# Patient Record
Sex: Female | Born: 1935
Health system: Southern US, Community
[De-identification: ages and names within clinical notes are randomized; demographics above are authoritative.]

## PROBLEM LIST (undated history)

## (undated) DIAGNOSIS — Z923 Personal history of irradiation: Secondary | ICD-10-CM

## (undated) DIAGNOSIS — Z9221 Personal history of antineoplastic chemotherapy: Secondary | ICD-10-CM

## (undated) DIAGNOSIS — M2550 Pain in unspecified joint: Secondary | ICD-10-CM

## (undated) DIAGNOSIS — H9190 Unspecified hearing loss, unspecified ear: Secondary | ICD-10-CM

## (undated) DIAGNOSIS — Z8619 Personal history of other infectious and parasitic diseases: Secondary | ICD-10-CM

## (undated) DIAGNOSIS — M545 Low back pain, unspecified: Secondary | ICD-10-CM

## (undated) DIAGNOSIS — IMO0001 Reserved for inherently not codable concepts without codable children: Secondary | ICD-10-CM

## (undated) DIAGNOSIS — R159 Full incontinence of feces: Secondary | ICD-10-CM

## (undated) DIAGNOSIS — M797 Fibromyalgia: Secondary | ICD-10-CM

## (undated) DIAGNOSIS — R3915 Urgency of urination: Secondary | ICD-10-CM

## (undated) DIAGNOSIS — D649 Anemia, unspecified: Secondary | ICD-10-CM

## (undated) DIAGNOSIS — Z860100 Personal history of colon polyps, unspecified: Secondary | ICD-10-CM

## (undated) DIAGNOSIS — F329 Major depressive disorder, single episode, unspecified: Secondary | ICD-10-CM

## (undated) DIAGNOSIS — E119 Type 2 diabetes mellitus without complications: Secondary | ICD-10-CM

## (undated) DIAGNOSIS — C801 Malignant (primary) neoplasm, unspecified: Secondary | ICD-10-CM

## (undated) DIAGNOSIS — H269 Unspecified cataract: Secondary | ICD-10-CM

## (undated) DIAGNOSIS — R531 Weakness: Secondary | ICD-10-CM

## (undated) DIAGNOSIS — Z8739 Personal history of other diseases of the musculoskeletal system and connective tissue: Secondary | ICD-10-CM

## (undated) DIAGNOSIS — Z9889 Other specified postprocedural states: Secondary | ICD-10-CM

## (undated) DIAGNOSIS — G629 Polyneuropathy, unspecified: Secondary | ICD-10-CM

## (undated) DIAGNOSIS — K219 Gastro-esophageal reflux disease without esophagitis: Secondary | ICD-10-CM

## (undated) DIAGNOSIS — R350 Frequency of micturition: Secondary | ICD-10-CM

## (undated) DIAGNOSIS — T4145XA Adverse effect of unspecified anesthetic, initial encounter: Secondary | ICD-10-CM

## (undated) DIAGNOSIS — R011 Cardiac murmur, unspecified: Secondary | ICD-10-CM

## (undated) DIAGNOSIS — Z5189 Encounter for other specified aftercare: Secondary | ICD-10-CM

## (undated) DIAGNOSIS — F32A Depression, unspecified: Secondary | ICD-10-CM

## (undated) DIAGNOSIS — M1990 Unspecified osteoarthritis, unspecified site: Secondary | ICD-10-CM

## (undated) DIAGNOSIS — E785 Hyperlipidemia, unspecified: Secondary | ICD-10-CM

## (undated) DIAGNOSIS — R0600 Dyspnea, unspecified: Secondary | ICD-10-CM

## (undated) DIAGNOSIS — R112 Nausea with vomiting, unspecified: Secondary | ICD-10-CM

## (undated) DIAGNOSIS — I10 Essential (primary) hypertension: Secondary | ICD-10-CM

## (undated) DIAGNOSIS — IMO0002 Reserved for concepts with insufficient information to code with codable children: Secondary | ICD-10-CM

## (undated) DIAGNOSIS — T8859XA Other complications of anesthesia, initial encounter: Secondary | ICD-10-CM

## (undated) DIAGNOSIS — Z8719 Personal history of other diseases of the digestive system: Secondary | ICD-10-CM

## (undated) DIAGNOSIS — R351 Nocturia: Secondary | ICD-10-CM

## (undated) DIAGNOSIS — N3010 Interstitial cystitis (chronic) without hematuria: Secondary | ICD-10-CM

## (undated) DIAGNOSIS — B399 Histoplasmosis, unspecified: Secondary | ICD-10-CM

## (undated) DIAGNOSIS — F419 Anxiety disorder, unspecified: Secondary | ICD-10-CM

## (undated) DIAGNOSIS — H409 Unspecified glaucoma: Secondary | ICD-10-CM

## (undated) HISTORY — PX: CERVICAL FUSION: SHX112

## (undated) HISTORY — DX: Essential (primary) hypertension: I10

## (undated) HISTORY — DX: Low back pain: M54.5

## (undated) HISTORY — PX: COLONOSCOPY: SHX174

## (undated) HISTORY — PX: BREAST LUMPECTOMY: SHX2

## (undated) HISTORY — DX: Gastro-esophageal reflux disease without esophagitis: K21.9

## (undated) HISTORY — DX: Encounter for other specified aftercare: Z51.89

## (undated) HISTORY — PX: DILATION AND CURETTAGE OF UTERUS: SHX78

## (undated) HISTORY — DX: Hyperlipidemia, unspecified: E78.5

## (undated) HISTORY — PX: OTHER SURGICAL HISTORY: SHX169

## (undated) HISTORY — PX: ABDOMINAL HYSTERECTOMY: SHX81

## (undated) HISTORY — DX: Cardiac murmur, unspecified: R01.1

## (undated) HISTORY — PX: CHOLECYSTECTOMY: SHX55

## (undated) HISTORY — PX: VAGINAL HYSTERECTOMY: SHX2639

## (undated) HISTORY — DX: Unspecified osteoarthritis, unspecified site: M19.90

## (undated) HISTORY — DX: Low back pain, unspecified: M54.50

## (undated) HISTORY — PX: SALPINGOOPHORECTOMY: SHX82

## (undated) HISTORY — DX: Interstitial cystitis (chronic) without hematuria: N30.10

## (undated) HISTORY — DX: Reserved for inherently not codable concepts without codable children: IMO0001

## (undated) HISTORY — DX: Reserved for concepts with insufficient information to code with codable children: IMO0002

## (undated) HISTORY — PX: ANKLE SURGERY: SHX546

## (undated) HISTORY — PX: UPPER GASTROINTESTINAL ENDOSCOPY: SHX188

## (undated) HISTORY — PX: ESOPHAGOGASTRODUODENOSCOPY: SHX1529

## (undated) HISTORY — PX: CYSTOCELE REPAIR: SHX163

## (undated) NOTE — *Deleted (*Deleted)
[facility]   [phone] [phone]   Clinic Follow up Note   Patient Care Team: Irena Reichmann, DO as PCP - General (Family Medicine) Rossie Muskrat, MD as Referring Physician (Rheumatology) Emelia Loron, MD as Consulting Physician (General Surgery) Malachy Mood, MD as Consulting Physician (Hematology) Axel Filler Larna Daughters, NP as Nurse Practitioner (Hematology and Oncology)  Date of Service:  04/08/2020  CHIEF COMPLAINT: F/u of left breast cancer  SUMMARY OF ONCOLOGIC HISTORY: Oncology History Overview Note  Malignant neoplasm of upper outer quadrant of female breast [facility])   Staging form: Breast, AJCC 7th Edition     Pathologic stage from 11/14/2015: Stage IA (T1c, N0, cM0) - Signed by Malachy Mood, MD on 11/25/2015     Clinical stage from 11/20/2015: Stage IA (T1c, N0, M0) - Signed by Si Gaul, MD on 11/20/2015     Malignant neoplasm of upper outer quadrant of female breast (HCC)  10/09/2015 Mammogram   Screening bilateral mammograms showed a possible left breast mass. Diagnostic mammogram and ultrasound showed a 7 x 7 x 7 mm mass in the left breast at 11:00 position.   10/22/2015 Receptors her2   ER negative, PR negative, HER-2 not amplified   10/22/2015 Initial Biopsy   Left breast needle core biopsy at the 1:00 position showed invasive ductal carcinoma.   10/22/2015 Initial Diagnosis   Malignant neoplasm of upper outer quadrant of female breast (HCC)   11/14/2015 Surgery   Left breast lumpectomy and sentinel lymph node biopsy Dwain Sarna)   11/14/2015 Pathology Results   Left breast lumpectomy showed invasive ductal carcinoma, grade 3, 1.1 cm, resection margins were negative, sentinel lymph node biopsy showed benign breast parenchyma, lymph node tissue not identified. Repeated ER PR and HER-2 were all negative.   12/20/2015 - 03/13/2016 Chemotherapy   Weekly Taxol 80 mg/m x 2 cycles, then changed to Abraxane 100 mg/m for cycles  3-12 due to infusion reaction. Abraxane dose-reduced cycle #9, then again for cycles #11 & #12 due to neuropathy. [total 12 cycles chemo]    04/01/2016 - 05/11/2016 Radiation Therapy   Adjuvant breast radiation ([facility]). Left breast: 50.4 Gy in 28 fractions   06/19/2016 Imaging   MR Lumbar Spine without contrast  IMPRESSION: 1. Diffuse lumbar spondylosis with discogenic and facet degenerative changes progressed from 2010 lumbar MRI. 2. Multilevel canal stenosis is mild-to-moderate at L2-3, moderate L3-4, and mild at L4-5. 3. Multiple levels of mild and moderate neural foraminal narrowing with severe foraminal narrowing at the right L3-4 and L4-5 levels.   11/12/2017 Imaging   11/12/2017 DEXA ASSESSMENT: The BMD measured at Forearm Radius 33% is 0.648 g/cm2 with a T-score of -2.7. This patient is considered osteoporotic according to World Health Organization [facility]) criteria.   11/12/2017 Mammogram   11/12/2017 Diagnostic Mammogram IMPRESSION: No mammographic evidence of malignancy in either breast, status post left lumpectomy.      CURRENT THERAPY:  Surveillance  INTERVAL HISTORY: *** Julie [name] is here for a follow up of left breast cancer. She presents to the clinic alone.    REVIEW OF SYSTEMS:  *** Constitutional: Denies fevers, chills or abnormal weight loss Eyes: Denies blurriness of vision Ears, nose, mouth, throat, and face: Denies mucositis or sore throat Respiratory: Denies cough, dyspnea or wheezes Cardiovascular: Denies palpitation, chest discomfort or lower extremity swelling Gastrointestinal:  Denies nausea, heartburn or change in bowel habits Skin: Denies abnormal skin rashes Lymphatics: Denies new lymphadenopathy or easy bruising Neurological:Denies numbness, tingling or new weaknesses  Behavioral/Psych: Mood is stable, no new changes  All other systems were reviewed with the patient and are negative.  MEDICAL HISTORY:  Past Medical History:   Diagnosis Date  . Anemia   . Anxiety    takes Klonopin at bedtime  . Arthritis    takes Methotrexate weekly and Prednisone  . Blood transfusion    no abnormal reaction noted  . Cancer (HCC)    breast   . Cataracts, bilateral    immature  . Complication of anesthesia    wakes up during surgery  . Depression   . Diabetes mellitus without complication (HCC)   . Dyspnea    with exertion since chemo   . Fibromyalgia    takes Lyrica daily  . GERD (gastroesophageal reflux disease)    takes Nexium daily  . Glaucoma   . Hard of hearing   . Heart murmur   . Histoplasmosis   . History of colon polyps   . History of gout   . History of hiatal hernia   . History of shingles   . Hyperlipidemia   . Hypertension    takes Amlodipine,Metoprolol,and Losartan daily  . Incontinence of bowel   . Interstitial cystitis   . Joint pain   . Low back pain   . Nocturia   . Peripheral neuropathy   . Peripheral neuropathy   . Personal history of chemotherapy   . Personal history of radiation therapy   . PONV (postoperative nausea and vomiting)   . Ulcer 1970   duodenal  . Urinary frequency   . Urinary urgency   . Weakness    numbness in both hands and feet    SURGICAL HISTORY: Past Surgical History:  Procedure Laterality Date  . ABDOMINAL HYSTERECTOMY    . accupuncture  3 yrs ago  . ANKLE FUSION Left 05/10/2014   Procedure: LEFT ANKLE ARTHRODESIS ;  Surgeon: Toni Arthurs, MD;  Location: [facility] OR;  Service: Orthopedics;  Laterality: Left;  . ANKLE SURGERY Left   . arm surgery Left    radius  . bladder tacked     . BREAST LUMPECTOMY Left 10/2015  . CERVICAL FUSION    . CHOLECYSTECTOMY    . COLONOSCOPY    . CYSTOCELE REPAIR    . DILATION AND CURETTAGE OF UTERUS    . ESOPHAGOGASTRODUODENOSCOPY    . PORT A CATH REVISION N/A 12/27/2015   Procedure: PORT A CATH REVISION;  Surgeon: Emelia Loron, MD;  Location: [facility];  Service: General;  Laterality: N/A;  .  PORT-A-CATH REMOVAL N/A 03/25/2016   Procedure: REMOVAL PORT-A-CATH;  Surgeon: Emelia Loron, MD;  Location: [facility] ORS;  Service: General;  Laterality: N/A;  . PORTACATH PLACEMENT Right 12/27/2015   Procedure: INSERTION PORT-A-CATH WITH ULTRASOUND ;  Surgeon: Emelia Loron, MD;  Location: [facility];  Service: General;  Laterality: Right;  . RADIOACTIVE SEED GUIDED PARTIAL MASTECTOMY WITH AXILLARY SENTINEL LYMPH NODE BIOPSY Left 11/14/2015   Procedure: RADIOACTIVE SEED GUIDED LEFT BREAST LUMPECTOMY WITH AXILLARY SENTINEL LYMPH NODE BIOPSY;  Surgeon: Emelia Loron, MD;  Location: [facility];  Service: General;  Laterality: Left;  RADIOACTIVE SEED GUIDED LEFT BREAST LUMPECTOMY WITH AXILLARY SENTINEL LYMPH NODE BIOPSY  . SALPINGOOPHORECTOMY Bilateral   . TOTAL KNEE ARTHROPLASTY Right 02/28/2015   Procedure: RIGHT TOTAL KNEE ARTHROPLASTY;  Surgeon: Samson Frederic, MD;  Location: [facility];  Service: Orthopedics;  Laterality: Right;  . UPPER GASTROINTESTINAL ENDOSCOPY    . VAGINAL HYSTERECTOMY  I have reviewed the social history and family history with the patient and they are unchanged from previous note.  ALLERGIES:  is allergic to codeine, other, and aspirin.  MEDICATIONS:  Current Outpatient Medications  Medication Sig Dispense Refill  . amitriptyline (ELAVIL) 50 MG tablet Take 50 mg by mouth at bedtime.    [name] amLODipine (NORVASC) 10 MG tablet Take 10 mg by mouth daily.    . cholecalciferol (VITAMIN D3) 25 MCG (1000 UNIT) tablet Take 1,000 Units by mouth daily.    . diclofenac Sodium (VOLTAREN) 1 % GEL SMARTSIG:3 Inch(es) Topical 3 Times Daily PRN    . docusate sodium (COLACE) 100 MG capsule Take 1 capsule (100 mg total) by mouth every 12 (twelve) hours. (Patient not taking: Reported on 01/16/2020) 60 capsule 0  . folic acid (FOLVITE) 1 MG tablet Take 2 mg by mouth every morning.     [name] FREESTYLE LITE test strip TEST ONCE EVERY DAY (Patient not taking:  Reported on 01/16/2020)  4  . hydroxychloroquine (PLAQUENIL) 200 MG tablet Take 200 mg by mouth daily.    [name] LIDOCAINE PAIN RELIEF 4 % PTCH Apply 1 patch topically 2 (two) times daily.    [name] lovastatin (MEVACOR) 40 MG tablet 1 (ONE) TABLET TAKE IN THE EVENING AFTER DINNER (Patient taking differently: Take 40 mg by mouth every evening. ) 90 tablet 1  . metoprolol succinate (TOPROL-XL) 50 MG 24 hr tablet Take 50 mg by mouth every evening.   2  . omeprazole (PRILOSEC) 40 MG capsule Take 40 mg by mouth daily.    [name] oxyCODONE (OXY IR/ROXICODONE) 5 MG immediate release tablet Take 5 mg by mouth 3 (three) times daily as needed.    . polyethylene glycol (MIRALAX / GLYCOLAX) 17 g packet Take 17 g by mouth daily.    . predniSONE (DELTASONE) 5 MG tablet Take 7.5 mg by mouth daily.    . predniSONE (STERAPRED UNI-PAK 21 TAB) 5 MG (21) TBPK tablet Take by mouth. (Patient not taking: Reported on 01/16/2020)    . pregabalin (LYRICA) 75 MG capsule Take 75-150 mg by mouth See admin instructions. 75 Mg in the Am and 150 MG at bedtime    . valsartan-hydrochlorothiazide (DIOVAN-HCT) 320-12.5 MG tablet Take 0.5 tablets by mouth daily.     No current facility-administered medications for this visit.    PHYSICAL EXAMINATION: ECOG PERFORMANCE STATUS: {CHL ONC ECOG PS:[phone]}  There were no vitals filed for this visit. There were no vitals filed for this visit. *** GENERAL:alert, no distress and comfortable SKIN: skin color, texture, turgor are normal, no rashes or significant lesions EYES: normal, Conjunctiva are pink and non-injected, sclera clear {OROPHARYNX:no exudate, no erythema and lips, buccal mucosa, and tongue normal}  NECK: supple, thyroid normal size, non-tender, without nodularity LYMPH:  no palpable lymphadenopathy in the cervical, axillary {or inguinal} LUNGS: clear to auscultation and percussion with normal breathing effort HEART: regular rate & rhythm and no murmurs and no lower extremity edema  ABDOMEN:abdomen soft, non-tender and normal bowel sounds Musculoskeletal:no cyanosis of digits and no clubbing  NEURO: alert & oriented x 3 with fluent speech, no focal motor/sensory deficits  LABORATORY DATA:  I have reviewed the data as listed CBC Latest Ref Rng & Units 02/13/2020 02/01/2020 12/14/2019  WBC 4.0 - 10.5 K/uL 4.1 4.2 6.8  Hemoglobin 12.0 - 15.0 g/dL 11.3(L) 11.0(L) 10.8(L)  Hematocrit 36 - 46 % 36.9 35.4(L) 35.3(L)  Platelets 150 - 400 K/uL 220 198 258     CMP  Latest Ref Rng & Units 02/13/2020 02/01/2020 12/14/2019  Glucose 70 - 99 mg/dL 16(X) 096(E) 454(U)  BUN 8 - 23 mg/dL 21 98(J) 19(J)  Creatinine 0.44 - 1.00 mg/dL 4.78(G) 9.56(O) 1.30(Q)  Sodium 135 - 145 mmol/L 140 144 142  Potassium 3.5 - 5.1 mmol/L 5.2(H) 3.8 3.9  Chloride 98 - 111 mmol/L 107 108 105  CO2 22 - 32 mmol/L 27 29 24   Calcium 8.9 - 10.3 mg/dL 9.1 9.1 9.5  Total Protein 6.5 - 8.1 g/dL 7.1 6.8 7.4  Total Bilirubin 0.3 - 1.2 mg/dL 0.5 0.5 0.4  Alkaline Phos 38 - 126 U/L 80 84 90  AST 15 - 41 U/L 25 27 22   ALT 0 - 44 U/L 16 15 9       RADIOGRAPHIC STUDIES: I have personally reviewed the radiological images as listed and agreed with the findings in the report. No results found.   ASSESSMENT & PLAN:  Julie [name] is a 68 y.o. female with    1. Malignant or new present of upper outer quadrant of left female breast, invasive ductal carcinoma, grade 3, pT1cN0M0, stage IA, ER-/PR-/HER2- -She was diagnosed in 09/2015. She is s/p left breast lumpectomy, adjuvant Taxol/Abraxane and adjuvant radiation.She is currently on Surveillance.  ***    2. MGUS  -10/2019labs showher M-protein and light chain levels have been overall stable  -No clinical concern for disease progression. Will continue follow up SPE/IFE, and light chain levels every 3-6 months. -Her 05/2019 M protein did increase to 0.9. Due to her newly onset anemia and renal insufficiency, I have some concern about disease progression.  I  discussed if M-Protein continues to increase will recommend bone marrow biopsy.  -Her 09/07/19 Lumbar CT was negative  -Repeat lab with MM Panel in 2 months for close follow up    3.Joint and muscular pain, Rheumatoid arthritis,lumbar spondylosis -She will continue follow up with rheumatologist Dr. Kathi Ludwig. -She has chronic low back pain from lumber spondylosis -Her chronic back pain continues to progress over the years and was recently exacerbated by increased activity in the past 3 months. Her CT Lumbar spine from 09/07/19 showed increased spinal stenosis of her lumbar spine.   4. Anemia,intermittent  -On Folic Acid -Pt notes 2021 endoscopy by Dr Elnoria Howard. I will obtain report. Last colonoscopy was in 2012. *** -I will check her iron panel with next labs. I recommend she start prenatal vitamin.   5.Peripheral neuropathy, G1-2 -No significant impact on her function, we'll continue to monitor closely. She understands that recovery from neuropathy is a slow process. -She has also triedacupuncture which helped. Continuetaking Lyricaand Vitamin B12. -overall stable.Ambulates withwalker -Not mentioned today ***  6. HTN -Continuemedications and continue to Follow with Dr. Selena Batten on this.   7. Short Term Memory loss -Ptpreviouslysharedconcern for memory loss but she deniedfurther medication to help. She previously declined clinical trial option. Practicingmental games and read and socialize to helpwas encouraged.  8. Osteoporosis -10/2017 DEXA shows osteoporosis with lowest T-score of -2.7 at Forearm radius. -She is athigh fracture risk due to her frequent falls.  -I started her on Prolia injections q40months on 02/01/20. She is currently on Vit D as well.     Plan *** -Obtain endoscopy report from Dr Haywood Pao office  -I will call her with MM panel results  -Lab in 2 months for close f/u  -may recommend bone marrow biopsy based on her MM lab today and next in 2 months   -Lab and F/u in 4  months, or sooner if needed      No problem-specific Assessment & Plan notes found for this encounter.   No orders of the defined types were placed in this encounter.  All questions were answered. The patient knows to call the clinic with any problems, questions or concerns. No barriers to learning was detected. The total time spent in the appointment was {CHL ONC TIME VISIT - YNWGN:[id number]}.     Delphina Cahill 04/08/2020   Rogelia Rohrer, am acting as scribe for Malachy Mood, MD.   {Add scribe attestation statement}

---

## 1968-05-25 DIAGNOSIS — IMO0002 Reserved for concepts with insufficient information to code with codable children: Secondary | ICD-10-CM

## 1968-05-25 HISTORY — DX: Reserved for concepts with insufficient information to code with codable children: IMO0002

## 2004-12-26 ENCOUNTER — Ambulatory Visit (HOSPITAL_COMMUNITY): Admission: RE | Admit: 2004-12-26 | Discharge: 2004-12-26 | Payer: Self-pay | Admitting: Internal Medicine

## 2005-01-15 ENCOUNTER — Encounter (INDEPENDENT_AMBULATORY_CARE_PROVIDER_SITE_OTHER): Payer: Self-pay | Admitting: *Deleted

## 2005-01-15 ENCOUNTER — Ambulatory Visit (HOSPITAL_COMMUNITY): Admission: RE | Admit: 2005-01-15 | Discharge: 2005-01-15 | Payer: Self-pay | Admitting: *Deleted

## 2005-01-30 ENCOUNTER — Encounter: Admission: RE | Admit: 2005-01-30 | Discharge: 2005-01-30 | Payer: Self-pay | Admitting: Internal Medicine

## 2005-02-06 ENCOUNTER — Emergency Department (HOSPITAL_COMMUNITY): Admission: EM | Admit: 2005-02-06 | Discharge: 2005-02-06 | Payer: Self-pay | Admitting: Family Medicine

## 2005-02-19 ENCOUNTER — Encounter: Admission: RE | Admit: 2005-02-19 | Discharge: 2005-02-19 | Payer: Self-pay | Admitting: Internal Medicine

## 2005-03-12 ENCOUNTER — Encounter: Admission: RE | Admit: 2005-03-12 | Discharge: 2005-03-12 | Payer: Self-pay | Admitting: Internal Medicine

## 2005-04-01 ENCOUNTER — Encounter
Admission: RE | Admit: 2005-04-01 | Discharge: 2005-04-01 | Payer: Self-pay | Admitting: Physical Medicine and Rehabilitation

## 2005-07-27 ENCOUNTER — Encounter: Admission: RE | Admit: 2005-07-27 | Discharge: 2005-07-27 | Payer: Self-pay | Admitting: Internal Medicine

## 2005-08-19 ENCOUNTER — Ambulatory Visit (HOSPITAL_COMMUNITY): Admission: RE | Admit: 2005-08-19 | Discharge: 2005-08-19 | Payer: Self-pay | Admitting: Orthopedic Surgery

## 2005-11-20 ENCOUNTER — Emergency Department (HOSPITAL_COMMUNITY): Admission: EM | Admit: 2005-11-20 | Discharge: 2005-11-20 | Payer: Self-pay | Admitting: *Deleted

## 2005-12-26 ENCOUNTER — Emergency Department (HOSPITAL_COMMUNITY): Admission: EM | Admit: 2005-12-26 | Discharge: 2005-12-26 | Payer: Self-pay | Admitting: Emergency Medicine

## 2006-01-20 ENCOUNTER — Encounter: Admission: RE | Admit: 2006-01-20 | Discharge: 2006-01-20 | Payer: Self-pay | Admitting: Internal Medicine

## 2006-04-08 ENCOUNTER — Ambulatory Visit (HOSPITAL_COMMUNITY): Admission: RE | Admit: 2006-04-08 | Discharge: 2006-04-08 | Payer: Self-pay | Admitting: Urology

## 2006-06-21 ENCOUNTER — Ambulatory Visit (HOSPITAL_COMMUNITY): Admission: RE | Admit: 2006-06-21 | Discharge: 2006-06-21 | Payer: Self-pay | Admitting: Neurosurgery

## 2006-09-21 ENCOUNTER — Ambulatory Visit: Payer: Self-pay | Admitting: Internal Medicine

## 2006-09-28 LAB — CBC WITH DIFFERENTIAL/PLATELET
Basophils Absolute: 0 10*3/uL (ref 0.0–0.1)
EOS%: 3 % (ref 0.0–7.0)
Eosinophils Absolute: 0.2 10*3/uL (ref 0.0–0.5)
HCT: 35.3 % (ref 34.8–46.6)
MCH: 27.3 pg (ref 26.0–34.0)
MCHC: 34.3 g/dL (ref 32.0–36.0)
MCV: 79.7 fL — ABNORMAL LOW (ref 81.0–101.0)
Platelets: 245 10*3/uL (ref 145–400)
WBC: 5.4 10*3/uL (ref 3.9–10.0)

## 2006-09-30 LAB — COMPREHENSIVE METABOLIC PANEL
ALT: 18 U/L (ref 0–35)
Albumin: 4.3 g/dL (ref 3.5–5.2)
Alkaline Phosphatase: 108 U/L (ref 39–117)
CO2: 27 mEq/L (ref 19–32)
Glucose, Bld: 89 mg/dL (ref 70–99)
Potassium: 4.5 mEq/L (ref 3.5–5.3)
Sodium: 145 mEq/L (ref 135–145)
Total Protein: 7.2 g/dL (ref 6.0–8.3)

## 2006-09-30 LAB — IMMUNOFIXATION ELECTROPHORESIS
IgG (Immunoglobin G), Serum: 899 mg/dL (ref 694–1618)
Total Protein, Serum Electrophoresis: 7.2 g/dL (ref 6.0–8.3)

## 2006-09-30 LAB — BETA 2 MICROGLOBULIN, SERUM: Beta-2 Microglobulin: 1.36 mg/L (ref 1.01–1.73)

## 2006-09-30 LAB — LACTATE DEHYDROGENASE: LDH: 177 U/L (ref 94–250)

## 2006-10-12 ENCOUNTER — Ambulatory Visit (HOSPITAL_COMMUNITY): Admission: RE | Admit: 2006-10-12 | Discharge: 2006-10-12 | Payer: Self-pay | Admitting: Internal Medicine

## 2006-10-15 ENCOUNTER — Encounter: Payer: Self-pay | Admitting: Internal Medicine

## 2006-10-15 ENCOUNTER — Ambulatory Visit (HOSPITAL_COMMUNITY): Admission: RE | Admit: 2006-10-15 | Discharge: 2006-10-15 | Payer: Self-pay | Admitting: Internal Medicine

## 2006-10-15 ENCOUNTER — Ambulatory Visit: Payer: Self-pay | Admitting: Internal Medicine

## 2006-11-03 ENCOUNTER — Ambulatory Visit (HOSPITAL_COMMUNITY): Admission: RE | Admit: 2006-11-03 | Discharge: 2006-11-03 | Payer: Self-pay | Admitting: *Deleted

## 2006-11-03 ENCOUNTER — Encounter (INDEPENDENT_AMBULATORY_CARE_PROVIDER_SITE_OTHER): Payer: Self-pay | Admitting: *Deleted

## 2007-01-20 ENCOUNTER — Ambulatory Visit: Payer: Self-pay | Admitting: Internal Medicine

## 2007-01-25 LAB — CBC WITH DIFFERENTIAL/PLATELET
Basophils Absolute: 0 10*3/uL (ref 0.0–0.1)
EOS%: 3.2 % (ref 0.0–7.0)
HCT: 37.7 % (ref 34.8–46.6)
HGB: 13 g/dL (ref 11.6–15.9)
MCH: 27.7 pg (ref 26.0–34.0)
NEUT%: 59 % (ref 39.6–76.8)
lymph#: 1.4 10*3/uL (ref 0.9–3.3)

## 2007-01-26 LAB — COMPREHENSIVE METABOLIC PANEL
Albumin: 4.5 g/dL (ref 3.5–5.2)
BUN: 17 mg/dL (ref 6–23)
Calcium: 9.6 mg/dL (ref 8.4–10.5)
Chloride: 106 mEq/L (ref 96–112)
Glucose, Bld: 94 mg/dL (ref 70–99)
Potassium: 4.1 mEq/L (ref 3.5–5.3)

## 2007-01-31 ENCOUNTER — Ambulatory Visit (HOSPITAL_COMMUNITY): Admission: RE | Admit: 2007-01-31 | Discharge: 2007-01-31 | Payer: Self-pay | Admitting: Internal Medicine

## 2007-02-02 LAB — KAPPA/LAMBDA LIGHT CHAINS
Kappa free light chain: 1.01 mg/dL (ref 0.33–1.94)
Kappa:Lambda Ratio: 0.95 (ref 0.26–1.65)

## 2007-02-02 LAB — IGG, IGA, IGM
IgA: 608 mg/dL — ABNORMAL HIGH (ref 68–378)
IgM, Serum: 88 mg/dL (ref 60–263)

## 2007-04-26 ENCOUNTER — Ambulatory Visit: Payer: Self-pay | Admitting: Internal Medicine

## 2007-04-28 LAB — CBC WITH DIFFERENTIAL/PLATELET
BASO%: 0.6 % (ref 0.0–2.0)
EOS%: 2.9 % (ref 0.0–7.0)
HCT: 36 % (ref 34.8–46.6)
LYMPH%: 27.9 % (ref 14.0–48.0)
MCH: 27.5 pg (ref 26.0–34.0)
MCHC: 34 g/dL (ref 32.0–36.0)
NEUT%: 61 % (ref 39.6–76.8)
Platelets: 228 10*3/uL (ref 145–400)
lymph#: 1.4 10*3/uL (ref 0.9–3.3)

## 2007-04-29 LAB — COMPREHENSIVE METABOLIC PANEL
ALT: 13 U/L (ref 0–35)
AST: 19 U/L (ref 0–37)
Alkaline Phosphatase: 98 U/L (ref 39–117)
Creatinine, Ser: 0.78 mg/dL (ref 0.40–1.20)
Total Bilirubin: 0.6 mg/dL (ref 0.3–1.2)

## 2007-04-29 LAB — KAPPA/LAMBDA LIGHT CHAINS: Kappa:Lambda Ratio: 0.51 (ref 0.26–1.65)

## 2007-04-29 LAB — BETA 2 MICROGLOBULIN, SERUM: Beta-2 Microglobulin: 1.66 mg/L (ref 1.01–1.73)

## 2007-05-14 ENCOUNTER — Emergency Department (HOSPITAL_COMMUNITY): Admission: EM | Admit: 2007-05-14 | Discharge: 2007-05-14 | Payer: Self-pay | Admitting: Emergency Medicine

## 2007-07-21 ENCOUNTER — Ambulatory Visit: Payer: Self-pay | Admitting: Internal Medicine

## 2007-07-25 LAB — CBC WITH DIFFERENTIAL/PLATELET
Eosinophils Absolute: 0.2 10*3/uL (ref 0.0–0.5)
HCT: 36.7 % (ref 34.8–46.6)
HGB: 12.2 g/dL (ref 11.6–15.9)
LYMPH%: 26.9 % (ref 14.0–48.0)
MONO#: 0.4 10*3/uL (ref 0.1–0.9)
NEUT#: 3.7 10*3/uL (ref 1.5–6.5)
NEUT%: 62.7 % (ref 39.6–76.8)
Platelets: 236 10*3/uL (ref 145–400)
RBC: 4.51 10*6/uL (ref 3.70–5.32)
WBC: 5.8 10*3/uL (ref 3.9–10.0)

## 2007-07-28 LAB — COMPREHENSIVE METABOLIC PANEL
Albumin: 4.3 g/dL (ref 3.5–5.2)
CO2: 26 mEq/L (ref 19–32)
Glucose, Bld: 91 mg/dL (ref 70–99)
Sodium: 143 mEq/L (ref 135–145)
Total Bilirubin: 0.5 mg/dL (ref 0.3–1.2)
Total Protein: 7.2 g/dL (ref 6.0–8.3)

## 2007-07-28 LAB — LACTATE DEHYDROGENASE: LDH: 173 U/L (ref 94–250)

## 2007-07-28 LAB — BETA 2 MICROGLOBULIN, SERUM: Beta-2 Microglobulin: 1.57 mg/L (ref 1.01–1.73)

## 2007-07-28 LAB — IGG, IGA, IGM
IgA: 572 mg/dL — ABNORMAL HIGH (ref 68–378)
IgG (Immunoglobin G), Serum: 859 mg/dL (ref 694–1618)
IgM, Serum: 80 mg/dL (ref 60–263)

## 2007-07-28 LAB — KAPPA/LAMBDA LIGHT CHAINS: Kappa free light chain: 2.26 mg/dL — ABNORMAL HIGH (ref 0.33–1.94)

## 2007-10-19 ENCOUNTER — Ambulatory Visit: Payer: Self-pay | Admitting: Internal Medicine

## 2007-10-21 LAB — CBC WITH DIFFERENTIAL/PLATELET
Basophils Absolute: 0 10*3/uL (ref 0.0–0.1)
Eosinophils Absolute: 0.2 10*3/uL (ref 0.0–0.5)
HCT: 36.8 % (ref 34.8–46.6)
HGB: 12.4 g/dL (ref 11.6–15.9)
LYMPH%: 14.4 % (ref 14.0–48.0)
MCV: 80.8 fL — ABNORMAL LOW (ref 81.0–101.0)
MONO#: 0.7 10*3/uL (ref 0.1–0.9)
MONO%: 7.6 % (ref 0.0–13.0)
NEUT#: 6.5 10*3/uL (ref 1.5–6.5)
NEUT%: 75.9 % (ref 39.6–76.8)
Platelets: 240 10*3/uL (ref 145–400)
WBC: 8.5 10*3/uL (ref 3.9–10.0)

## 2007-10-24 LAB — IGG, IGA, IGM: IgG (Immunoglobin G), Serum: 842 mg/dL (ref 694–1618)

## 2008-01-23 ENCOUNTER — Ambulatory Visit: Payer: Self-pay | Admitting: Internal Medicine

## 2008-01-26 LAB — CBC WITH DIFFERENTIAL/PLATELET
Basophils Absolute: 0 10*3/uL (ref 0.0–0.1)
EOS%: 3.6 % (ref 0.0–7.0)
Eosinophils Absolute: 0.2 10*3/uL (ref 0.0–0.5)
HGB: 12.6 g/dL (ref 11.6–15.9)
LYMPH%: 28.6 % (ref 14.0–48.0)
MCH: 27.6 pg (ref 26.0–34.0)
MCV: 83 fL (ref 81.0–101.0)
MONO%: 6.7 % (ref 0.0–13.0)
NEUT#: 2.8 10*3/uL (ref 1.5–6.5)
NEUT%: 60.6 % (ref 39.6–76.8)
Platelets: 232 10*3/uL (ref 145–400)
RDW: 14.4 % (ref 11.3–14.5)

## 2008-01-27 LAB — LACTATE DEHYDROGENASE: LDH: 227 U/L (ref 94–250)

## 2008-01-27 LAB — COMPREHENSIVE METABOLIC PANEL
Alkaline Phosphatase: 99 U/L (ref 39–117)
BUN: 29 mg/dL — ABNORMAL HIGH (ref 6–23)
CO2: 24 mEq/L (ref 19–32)
Creatinine, Ser: 0.79 mg/dL (ref 0.40–1.20)
Glucose, Bld: 85 mg/dL (ref 70–99)
Sodium: 145 mEq/L (ref 135–145)
Total Bilirubin: 0.7 mg/dL (ref 0.3–1.2)
Total Protein: 7.3 g/dL (ref 6.0–8.3)

## 2008-01-27 LAB — KAPPA/LAMBDA LIGHT CHAINS
Kappa:Lambda Ratio: 1.63 (ref 0.26–1.65)
Lambda Free Lght Chn: 0.87 mg/dL (ref 0.57–2.63)

## 2008-01-27 LAB — IGG, IGA, IGM: IgG (Immunoglobin G), Serum: 887 mg/dL (ref 694–1618)

## 2008-02-15 ENCOUNTER — Ambulatory Visit (HOSPITAL_COMMUNITY): Admission: RE | Admit: 2008-02-15 | Discharge: 2008-02-15 | Payer: Self-pay | Admitting: Internal Medicine

## 2008-07-23 ENCOUNTER — Ambulatory Visit: Payer: Self-pay | Admitting: Internal Medicine

## 2008-07-25 LAB — CBC WITH DIFFERENTIAL/PLATELET
BASO%: 0.5 % (ref 0.0–2.0)
EOS%: 2.7 % (ref 0.0–7.0)
MCH: 27.2 pg (ref 25.1–34.0)
MCHC: 32.9 g/dL (ref 31.5–36.0)
MCV: 82.6 fL (ref 79.5–101.0)
MONO%: 7.8 % (ref 0.0–14.0)
RBC: 4.56 10*6/uL (ref 3.70–5.45)
RDW: 14.4 % (ref 11.2–14.5)

## 2008-07-26 LAB — COMPREHENSIVE METABOLIC PANEL
ALT: 11 U/L (ref 0–35)
AST: 19 U/L (ref 0–37)
Albumin: 4.4 g/dL (ref 3.5–5.2)
Alkaline Phosphatase: 98 U/L (ref 39–117)
BUN: 21 mg/dL (ref 6–23)
Potassium: 3.7 mEq/L (ref 3.5–5.3)
Sodium: 144 mEq/L (ref 135–145)
Total Protein: 7.4 g/dL (ref 6.0–8.3)

## 2008-07-26 LAB — KAPPA/LAMBDA LIGHT CHAINS: Lambda Free Lght Chn: 1.19 mg/dL (ref 0.57–2.63)

## 2008-07-26 LAB — IGG, IGA, IGM
IgA: 690 mg/dL — ABNORMAL HIGH (ref 68–378)
IgG (Immunoglobin G), Serum: 813 mg/dL (ref 694–1618)
IgM, Serum: 80 mg/dL (ref 60–263)

## 2008-08-27 ENCOUNTER — Encounter: Admission: RE | Admit: 2008-08-27 | Discharge: 2008-08-27 | Payer: Self-pay | Admitting: Orthopedic Surgery

## 2009-01-21 ENCOUNTER — Ambulatory Visit: Payer: Self-pay | Admitting: Internal Medicine

## 2009-01-23 LAB — CBC WITH DIFFERENTIAL/PLATELET
Basophils Absolute: 0 10*3/uL (ref 0.0–0.1)
Eosinophils Absolute: 0.1 10*3/uL (ref 0.0–0.5)
HGB: 11.6 g/dL (ref 11.6–15.9)
LYMPH%: 19.5 % (ref 14.0–49.7)
MCV: 80.7 fL (ref 79.5–101.0)
MONO#: 0.5 10*3/uL (ref 0.1–0.9)
MONO%: 8.2 % (ref 0.0–14.0)
NEUT#: 3.9 10*3/uL (ref 1.5–6.5)
Platelets: 246 10*3/uL (ref 145–400)
RBC: 4.32 10*6/uL (ref 3.70–5.45)
RDW: 14.3 % (ref 11.2–14.5)
WBC: 5.5 10*3/uL (ref 3.9–10.3)

## 2009-01-24 LAB — COMPREHENSIVE METABOLIC PANEL
Albumin: 3.9 g/dL (ref 3.5–5.2)
BUN: 20 mg/dL (ref 6–23)
CO2: 22 mEq/L (ref 19–32)
Glucose, Bld: 156 mg/dL — ABNORMAL HIGH (ref 70–99)
Potassium: 3.8 mEq/L (ref 3.5–5.3)
Sodium: 143 mEq/L (ref 135–145)
Total Protein: 6.8 g/dL (ref 6.0–8.3)

## 2009-01-24 LAB — KAPPA/LAMBDA LIGHT CHAINS
Kappa free light chain: 1.33 mg/dL (ref 0.33–1.94)
Kappa:Lambda Ratio: 1.22 (ref 0.26–1.65)
Lambda Free Lght Chn: 1.09 mg/dL (ref 0.57–2.63)

## 2009-01-24 LAB — IGG, IGA, IGM
IgA: 628 mg/dL — ABNORMAL HIGH (ref 68–378)
IgM, Serum: 77 mg/dL (ref 60–263)

## 2009-02-15 ENCOUNTER — Ambulatory Visit (HOSPITAL_COMMUNITY): Admission: RE | Admit: 2009-02-15 | Discharge: 2009-02-15 | Payer: Self-pay | Admitting: Internal Medicine

## 2009-07-19 ENCOUNTER — Ambulatory Visit: Payer: Self-pay | Admitting: Internal Medicine

## 2009-07-24 LAB — CBC WITH DIFFERENTIAL/PLATELET
Basophils Absolute: 0 10*3/uL (ref 0.0–0.1)
EOS%: 2.8 % (ref 0.0–7.0)
Eosinophils Absolute: 0.2 10*3/uL (ref 0.0–0.5)
HGB: 12.3 g/dL (ref 11.6–15.9)
NEUT#: 4.1 10*3/uL (ref 1.5–6.5)
RBC: 4.54 10*6/uL (ref 3.70–5.45)
RDW: 15.3 % — ABNORMAL HIGH (ref 11.2–14.5)
lymph#: 1.2 10*3/uL (ref 0.9–3.3)

## 2009-07-25 LAB — COMPREHENSIVE METABOLIC PANEL
AST: 17 U/L (ref 0–37)
Albumin: 4.1 g/dL (ref 3.5–5.2)
BUN: 22 mg/dL (ref 6–23)
Calcium: 9 mg/dL (ref 8.4–10.5)
Chloride: 105 mEq/L (ref 96–112)
Glucose, Bld: 97 mg/dL (ref 70–99)
Potassium: 4 mEq/L (ref 3.5–5.3)
Sodium: 141 mEq/L (ref 135–145)
Total Protein: 7.2 g/dL (ref 6.0–8.3)

## 2009-07-25 LAB — KAPPA/LAMBDA LIGHT CHAINS
Kappa free light chain: 1.74 mg/dL (ref 0.33–1.94)
Lambda Free Lght Chn: 1.25 mg/dL (ref 0.57–2.63)

## 2009-07-25 LAB — IGG, IGA, IGM: IgA: 756 mg/dL — ABNORMAL HIGH (ref 68–378)

## 2010-01-20 ENCOUNTER — Ambulatory Visit: Payer: Self-pay | Admitting: Internal Medicine

## 2010-01-22 LAB — CBC WITH DIFFERENTIAL/PLATELET
BASO%: 0.6 % (ref 0.0–2.0)
Basophils Absolute: 0 10*3/uL (ref 0.0–0.1)
EOS%: 3.5 % (ref 0.0–7.0)
HGB: 12.8 g/dL (ref 11.6–15.9)
MCH: 26.7 pg (ref 25.1–34.0)
RDW: 15.1 % — ABNORMAL HIGH (ref 11.2–14.5)
lymph#: 1.5 10*3/uL (ref 0.9–3.3)

## 2010-01-24 LAB — COMPREHENSIVE METABOLIC PANEL
ALT: 10 U/L (ref 0–35)
AST: 18 U/L (ref 0–37)
Albumin: 4.2 g/dL (ref 3.5–5.2)
BUN: 16 mg/dL (ref 6–23)
Calcium: 9.5 mg/dL (ref 8.4–10.5)
Chloride: 104 mEq/L (ref 96–112)
Potassium: 3.8 mEq/L (ref 3.5–5.3)
Sodium: 142 mEq/L (ref 135–145)
Total Protein: 7 g/dL (ref 6.0–8.3)

## 2010-01-24 LAB — KAPPA/LAMBDA LIGHT CHAINS
Kappa:Lambda Ratio: 1.12 (ref 0.26–1.65)
Lambda Free Lght Chn: 1.09 mg/dL (ref 0.57–2.63)

## 2010-02-19 ENCOUNTER — Ambulatory Visit (HOSPITAL_COMMUNITY): Admission: RE | Admit: 2010-02-19 | Discharge: 2010-02-19 | Payer: Self-pay | Admitting: Internal Medicine

## 2010-02-28 ENCOUNTER — Encounter: Admission: RE | Admit: 2010-02-28 | Discharge: 2010-02-28 | Payer: Self-pay | Admitting: Internal Medicine

## 2010-06-14 ENCOUNTER — Encounter: Payer: Self-pay | Admitting: Internal Medicine

## 2010-06-15 ENCOUNTER — Encounter: Payer: Self-pay | Admitting: Orthopedic Surgery

## 2010-07-23 ENCOUNTER — Other Ambulatory Visit: Payer: Self-pay | Admitting: Internal Medicine

## 2010-07-23 ENCOUNTER — Encounter (HOSPITAL_BASED_OUTPATIENT_CLINIC_OR_DEPARTMENT_OTHER): Payer: Medicare Other | Admitting: Internal Medicine

## 2010-07-23 DIAGNOSIS — D472 Monoclonal gammopathy: Secondary | ICD-10-CM

## 2010-07-23 LAB — CBC WITH DIFFERENTIAL/PLATELET
Basophils Absolute: 0 10*3/uL (ref 0.0–0.1)
Eosinophils Absolute: 0.2 10*3/uL (ref 0.0–0.5)
HGB: 11.9 g/dL (ref 11.6–15.9)
MCV: 82.3 fL (ref 79.5–101.0)
MONO#: 0.4 10*3/uL (ref 0.1–0.9)
MONO%: 7.3 % (ref 0.0–14.0)
NEUT#: 3.6 10*3/uL (ref 1.5–6.5)
RBC: 4.36 10*6/uL (ref 3.70–5.45)
RDW: 14.7 % — ABNORMAL HIGH (ref 11.2–14.5)
WBC: 5.6 10*3/uL (ref 3.9–10.3)
lymph#: 1.4 10*3/uL (ref 0.9–3.3)

## 2010-07-24 LAB — COMPREHENSIVE METABOLIC PANEL
Albumin: 4.2 g/dL (ref 3.5–5.2)
Alkaline Phosphatase: 84 U/L (ref 39–117)
BUN: 16 mg/dL (ref 6–23)
CO2: 28 mEq/L (ref 19–32)
Calcium: 9.1 mg/dL (ref 8.4–10.5)
Chloride: 104 mEq/L (ref 96–112)
Glucose, Bld: 83 mg/dL (ref 70–99)
Potassium: 3.6 mEq/L (ref 3.5–5.3)
Sodium: 141 mEq/L (ref 135–145)
Total Protein: 7.1 g/dL (ref 6.0–8.3)

## 2010-07-24 LAB — IGG, IGA, IGM
IgA: 667 mg/dL — ABNORMAL HIGH (ref 68–378)
IgG (Immunoglobin G), Serum: 874 mg/dL (ref 694–1618)

## 2010-07-24 LAB — KAPPA/LAMBDA LIGHT CHAINS: Kappa free light chain: 1.39 mg/dL (ref 0.33–1.94)

## 2010-07-24 LAB — LACTATE DEHYDROGENASE: LDH: 189 U/L (ref 94–250)

## 2010-07-30 ENCOUNTER — Encounter (HOSPITAL_BASED_OUTPATIENT_CLINIC_OR_DEPARTMENT_OTHER): Payer: Medicare Other | Admitting: Internal Medicine

## 2010-07-30 DIAGNOSIS — I10 Essential (primary) hypertension: Secondary | ICD-10-CM

## 2010-07-30 DIAGNOSIS — D472 Monoclonal gammopathy: Secondary | ICD-10-CM

## 2010-10-07 NOTE — Op Note (Signed)
[name], Julie [name]               ACCOUNT NO.:  [id number]   MEDICAL RECORD NO.:  [id number]          PATIENT TYPE:  AMB   LOCATION:  ENDO                         FACILITY:  [facility]   PHYSICIAN:  Georgiana Spinner, M.D.    DATE OF BIRTH:  1932-06-17   DATE OF PROCEDURE:  11/03/2006  DATE OF DISCHARGE:                               OPERATIVE REPORT   PROCEDURE:  Colonoscopy.   INDICATIONS:  Hemoccult positivity.   ANESTHESIA:  Fentanyl 40 mcg, Versed 3 mg.   PROCEDURE:  With the patient mildly sedated in the left lateral  decubitus position, the Pentax videoscopic colonoscope was inserted into  the rectum and passed under direct vision to the cecum, identified by  ileocecal valve and appendiceal orifice, both of which were  photographed.  From this point the colonoscope was slowly withdrawn  taking circumferential views of the colonic mucosa, stopping to take  random biopsies along the way because of the patient's diarrhea until we  reached the rectum, which appeared normal on direct and showed  hemorrhoids on retroflexed view.  The endoscope was straightened and  withdrawn.  The patient's vital signs and pulse oximeter remained  stable.  The patient tolerated the procedure well and without apparent  complications.   FINDINGS:  1. Internal hemorrhoids.  2. An occasional diverticulum of the sigmoid colon.  3. Otherwise an unremarkable exam.   PLAN:  Await biopsy report.  The patient will call me for results and  follow up with me as an outpatient.           ______________________________  Georgiana Spinner, M.D.     GMO/MEDQ  D:  11/03/2006  T:  11/03/2006  Job:  [id number]

## 2010-10-07 NOTE — Op Note (Signed)
Julie [name], Julie [name]               ACCOUNT NO.:  [id number]   MEDICAL RECORD NO.:  [id number]          PATIENT TYPE:  AMB   LOCATION:  ENDO                         [facility]   PHYSICIAN:  Georgiana Spinner, M.D.    DATE OF BIRTH:  May 05, 1933   DATE OF PROCEDURE:  11/03/2006  DATE OF DISCHARGE:                               OPERATIVE REPORT   PROCEDURE:  Upper endoscopy.   INDICATIONS:  Hemoccult positivity.   ANESTHESIA:  Fentanyl 60 mcg and Versed 7 mg.   PROCEDURE NOTE:  With the patient mildly sedated in the left lateral  decubitus position, the Pentax videoscopic endoscope was inserted and  passed under direct vision through the esophagus which appeared normal.  There was no evidence of Barrett's or esophagitis.  We entered into the  stomach.  Fundus and body appeared normal.  It appeared that the patient  had an antrectomy and there was a nodule on the gastric side that was  photographed and biopsied.  We advanced through the small intestinal  Billroth I anastomosis and this appeared normal.  From this point, the  endoscope was then slowly withdrawn taking circumferential views of the  small bowel mucosa until the endoscope had been pulled back into the  stomach, placed in retroflexion to view the stomach from below.  The  endoscope was straightened and withdrawn taking circumferential views of  the remaining gastric and esophageal mucosa.  The patient's vital signs  and pulse oximeter remained stable.  The patient tolerated the procedure  well without apparent complications.   FINDINGS:  Nodule in stomach biopsied.  Await biopsy report.  The  patient will call me for results and follow-up with me as an outpatient.  Proceed to colonoscopy.           ______________________________  Georgiana Spinner, M.D.     GMO/MEDQ  D:  11/03/2006  T:  11/03/2006  Job:  [id number]

## 2010-12-25 ENCOUNTER — Encounter: Payer: Self-pay | Admitting: Gastroenterology

## 2011-01-07 ENCOUNTER — Ambulatory Visit (AMBULATORY_SURGERY_CENTER): Payer: Medicare Other | Admitting: *Deleted

## 2011-01-07 VITALS — Ht 60.75 in | Wt 213.0 lb

## 2011-01-07 DIAGNOSIS — R198 Other specified symptoms and signs involving the digestive system and abdomen: Secondary | ICD-10-CM

## 2011-01-07 MED ORDER — PEG-KCL-NACL-NASULF-NA ASC-C 100 G PO SOLR: ORAL | Status: DC

## 2011-01-21 ENCOUNTER — Encounter: Payer: Self-pay | Admitting: Gastroenterology

## 2011-01-21 ENCOUNTER — Ambulatory Visit (AMBULATORY_SURGERY_CENTER): Payer: Medicare Other | Admitting: Gastroenterology

## 2011-01-21 DIAGNOSIS — Z800 Family history of malignant neoplasm of digestive organs: Secondary | ICD-10-CM

## 2011-01-21 DIAGNOSIS — K5730 Diverticulosis of large intestine without perforation or abscess without bleeding: Secondary | ICD-10-CM

## 2011-01-21 DIAGNOSIS — Z1211 Encounter for screening for malignant neoplasm of colon: Secondary | ICD-10-CM

## 2011-01-21 DIAGNOSIS — R198 Other specified symptoms and signs involving the digestive system and abdomen: Secondary | ICD-10-CM | POA: Insufficient documentation

## 2011-01-21 MED ORDER — SODIUM CHLORIDE 0.9 % IV SOLN: 500.0000 mL | INTRAVENOUS | Status: DC

## 2011-01-21 NOTE — Patient Instructions (Signed)
See the picture page for your findings from your exam today.  Follow the green and blue discharge instruction sheets the rest of the day.  Resume your prior medications today. Resume your prior medications today.  

## 2011-01-21 NOTE — Progress Notes (Signed)
Darlyn Read, rn went over discharge instructions with the pt's daughter.  MAW  Pt voiced no complaints in the recovery room or on discharge. MAW  Pt to the restroom with her daughter at her side.  maw

## 2011-01-22 ENCOUNTER — Telehealth: Payer: Self-pay

## 2011-01-22 NOTE — Telephone Encounter (Signed)

## 2011-01-23 ENCOUNTER — Telehealth: Payer: Self-pay | Admitting: Gastroenterology

## 2011-01-23 NOTE — Telephone Encounter (Signed)
Pt reports she hasn't had a BM since her COLON on 01/20/11. Explained to pt maybe the Prep messed up her system and to try Miralax daily until she is regular; pt stated understanding.

## 2011-01-23 NOTE — Telephone Encounter (Signed)
lmom for pt to call back. Pt had procedure on 01/21/11; normal except some diverticulosis.

## 2011-02-05 ENCOUNTER — Other Ambulatory Visit: Payer: Self-pay | Admitting: Internal Medicine

## 2011-02-05 ENCOUNTER — Encounter (HOSPITAL_BASED_OUTPATIENT_CLINIC_OR_DEPARTMENT_OTHER): Payer: Medicare Other | Admitting: Internal Medicine

## 2011-02-05 DIAGNOSIS — I10 Essential (primary) hypertension: Secondary | ICD-10-CM

## 2011-02-05 DIAGNOSIS — D472 Monoclonal gammopathy: Secondary | ICD-10-CM

## 2011-02-05 LAB — CBC WITH DIFFERENTIAL/PLATELET
BASO%: 0.7 % (ref 0.0–2.0)
Eosinophils Absolute: 0.2 10*3/uL (ref 0.0–0.5)
HCT: 36.1 % (ref 34.8–46.6)
LYMPH%: 25.8 % (ref 14.0–49.7)
MCHC: 33.6 g/dL (ref 31.5–36.0)
MCV: 82.1 fL (ref 79.5–101.0)
MONO%: 8.1 % (ref 0.0–14.0)
NEUT%: 62.1 % (ref 38.4–76.8)
Platelets: 238 10*3/uL (ref 145–400)
RBC: 4.4 10*6/uL (ref 3.70–5.45)

## 2011-02-06 LAB — COMPREHENSIVE METABOLIC PANEL
Albumin: 4.1 g/dL (ref 3.5–5.2)
Alkaline Phosphatase: 91 U/L (ref 39–117)
CO2: 28 mEq/L (ref 19–32)
Calcium: 8.9 mg/dL (ref 8.4–10.5)
Chloride: 107 mEq/L (ref 96–112)
Glucose, Bld: 105 mg/dL — ABNORMAL HIGH (ref 70–99)
Potassium: 3.8 mEq/L (ref 3.5–5.3)
Sodium: 144 mEq/L (ref 135–145)
Total Protein: 6.6 g/dL (ref 6.0–8.3)

## 2011-02-06 LAB — IGG, IGA, IGM: IgG (Immunoglobin G), Serum: 803 mg/dL (ref 690–1700)

## 2011-02-06 LAB — KAPPA/LAMBDA LIGHT CHAINS: Kappa:Lambda Ratio: 2.57 — ABNORMAL HIGH (ref 0.26–1.65)

## 2011-02-16 ENCOUNTER — Encounter (HOSPITAL_BASED_OUTPATIENT_CLINIC_OR_DEPARTMENT_OTHER): Payer: Medicare Other | Admitting: Internal Medicine

## 2011-02-16 DIAGNOSIS — D472 Monoclonal gammopathy: Secondary | ICD-10-CM

## 2011-02-16 DIAGNOSIS — M25579 Pain in unspecified ankle and joints of unspecified foot: Secondary | ICD-10-CM

## 2011-02-27 LAB — URINALYSIS, ROUTINE W REFLEX MICROSCOPIC
Glucose, UA: NEGATIVE
Ketones, ur: NEGATIVE
Nitrite: NEGATIVE
Specific Gravity, Urine: 1.019
pH: 6

## 2011-02-27 LAB — URINE MICROSCOPIC-ADD ON

## 2011-03-11 ENCOUNTER — Other Ambulatory Visit: Payer: Self-pay | Admitting: Internal Medicine

## 2011-03-11 DIAGNOSIS — R200 Anesthesia of skin: Secondary | ICD-10-CM

## 2011-03-11 DIAGNOSIS — IMO0002 Reserved for concepts with insufficient information to code with codable children: Secondary | ICD-10-CM

## 2011-03-17 ENCOUNTER — Ambulatory Visit
Admission: RE | Admit: 2011-03-17 | Discharge: 2011-03-17 | Disposition: A | Discharge status: Home / Self Care | Payer: Medicare Other | Source: Ambulatory Visit | Attending: Internal Medicine | Admitting: Internal Medicine

## 2011-03-17 DIAGNOSIS — R200 Anesthesia of skin: Secondary | ICD-10-CM

## 2011-03-17 DIAGNOSIS — IMO0002 Reserved for concepts with insufficient information to code with codable children: Secondary | ICD-10-CM

## 2011-06-27 ENCOUNTER — Telehealth: Payer: Self-pay | Admitting: Internal Medicine

## 2011-06-27 NOTE — Telephone Encounter (Signed)
Talked to pt, gave her appt date for 3/22 lab draw and md visit on 08/17/11

## 2011-07-16 ENCOUNTER — Other Ambulatory Visit: Payer: Self-pay | Admitting: Internal Medicine

## 2011-07-16 DIAGNOSIS — Z1231 Encounter for screening mammogram for malignant neoplasm of breast: Secondary | ICD-10-CM

## 2011-07-21 DIAGNOSIS — R05 Cough: Secondary | ICD-10-CM | POA: Diagnosis not present

## 2011-07-29 ENCOUNTER — Ambulatory Visit
Admission: RE | Admit: 2011-07-29 | Discharge: 2011-07-29 | Disposition: A | Discharge status: Home / Self Care | Payer: Medicare Other | Source: Ambulatory Visit | Attending: Internal Medicine | Admitting: Internal Medicine

## 2011-07-29 DIAGNOSIS — Z1231 Encounter for screening mammogram for malignant neoplasm of breast: Secondary | ICD-10-CM

## 2011-08-12 DIAGNOSIS — M779 Enthesopathy, unspecified: Secondary | ICD-10-CM | POA: Diagnosis not present

## 2011-08-12 DIAGNOSIS — R05 Cough: Secondary | ICD-10-CM | POA: Diagnosis not present

## 2011-08-12 DIAGNOSIS — R7989 Other specified abnormal findings of blood chemistry: Secondary | ICD-10-CM | POA: Diagnosis not present

## 2011-08-13 DIAGNOSIS — I10 Essential (primary) hypertension: Secondary | ICD-10-CM | POA: Diagnosis not present

## 2011-08-13 DIAGNOSIS — R7989 Other specified abnormal findings of blood chemistry: Secondary | ICD-10-CM | POA: Diagnosis not present

## 2011-08-14 ENCOUNTER — Other Ambulatory Visit (HOSPITAL_BASED_OUTPATIENT_CLINIC_OR_DEPARTMENT_OTHER): Payer: Medicare Other | Admitting: Lab

## 2011-08-14 DIAGNOSIS — D472 Monoclonal gammopathy: Secondary | ICD-10-CM | POA: Diagnosis not present

## 2011-08-14 DIAGNOSIS — I10 Essential (primary) hypertension: Secondary | ICD-10-CM | POA: Diagnosis not present

## 2011-08-14 LAB — CBC WITH DIFFERENTIAL/PLATELET
BASO%: 0.6 % (ref 0.0–2.0)
HCT: 37.9 % (ref 34.8–46.6)
MCHC: 31.1 g/dL — ABNORMAL LOW (ref 31.5–36.0)
MONO#: 0.5 10*3/uL (ref 0.1–0.9)
NEUT%: 63.9 % (ref 38.4–76.8)
RBC: 4.6 10*6/uL (ref 3.70–5.45)
RDW: 15 % — ABNORMAL HIGH (ref 11.2–14.5)
WBC: 7.1 10*3/uL (ref 3.9–10.3)
lymph#: 1.8 10*3/uL (ref 0.9–3.3)

## 2011-08-17 ENCOUNTER — Ambulatory Visit (HOSPITAL_BASED_OUTPATIENT_CLINIC_OR_DEPARTMENT_OTHER): Payer: Medicare Other | Admitting: Internal Medicine

## 2011-08-17 ENCOUNTER — Telehealth: Payer: Self-pay | Admitting: Internal Medicine

## 2011-08-17 VITALS — BP 169/91 | HR 70 | Temp 99.1°F | Ht 63.75 in | Wt 213.2 lb

## 2011-08-17 DIAGNOSIS — D472 Monoclonal gammopathy: Secondary | ICD-10-CM | POA: Diagnosis not present

## 2011-08-17 NOTE — Progress Notes (Signed)
[facility] Telephone:(336) [phone]   Fax:(336) [phone]  OFFICE PROGRESS NOTE  Juline Patch, MD, MD [address] [facility] [place] [address]  PRINCIPAL DIAGNOSIS:  Monoclonal gammopathy of undetermined significance (MGUS).  CURRENT THERAPY:  Observation.  INTERVAL HISTORY: Julie [name] 78 y.o. female returns to the clinic today for routine six-month followup visit. The patient complaining of generalized aching pain and insomnia. She also has been on the right hand and was referred by her primary care physician to a hand surgeon. She denied having any significant weight loss or night sweats. She has no chest pain or shortness of breath. The patient has repeat CBC, comprehensive metabolic panel, LDH and myeloma panel performed recently and she is here today for evaluation and discussion of her lab results.  MEDICAL HISTORY: Past Medical History  Diagnosis Date  . Arthritis   . Blood transfusion   . Cataract   . Heart murmur   . Hyperlipidemia   . Hypertension   . Interstitial cystitis   . Ulcer 1970    duodenal  . GERD (gastroesophageal reflux disease)     ALLERGIES:  is allergic to gelnique; macrobid; nitrofurantoin; and sanctura.  MEDICATIONS:  Current Outpatient Prescriptions  Medication Sig Dispense Refill  . amLODipine-benazepril (LOTREL) 10-20 MG per capsule Take 1 capsule by mouth daily.        [name] aspirin 81 MG tablet Take 81 mg by mouth every other day.      . Calcium Carbonate-Vitamin D (CALCIUM 600 + D PO) Take 1 tablet by mouth daily.        . diazepam (VALIUM) 5 MG tablet Take 5 mg by mouth as needed. Muscle spasm       . esomeprazole (NEXIUM) 40 MG capsule Take 40 mg by mouth daily before breakfast.        . fish oil-omega-3 fatty acids 1000 MG capsule Take 1 g by mouth daily.        . folic acid (FOLVITE) 1 MG tablet Take 1 mg by mouth daily.        . Multiple Vitamins-Minerals (MULTIVITAMIN WITH  MINERALS) tablet Take 1 tablet by mouth daily.        . vitamin E 400 UNIT capsule Take 400 Units by mouth daily.        . meloxicam (MOBIC) 15 MG tablet Take 15 mg by mouth daily.        . metoprolol (LOPRESSOR) 50 MG tablet Take 50 mg by mouth at bedtime.        . pravastatin (PRAVACHOL) 80 MG tablet Take 80 mg by mouth daily.        . pregabalin (LYRICA) 75 MG capsule Take 75 mg by mouth 2 (two) times daily.          SURGICAL HISTORY:  Past Surgical History  Procedure Date  . Upper gastrointestinal endoscopy   . Colonoscopy   . Vaginal hysterectomy   . Bilateral oophorectomy     REVIEW OF SYSTEMS:  A comprehensive review of systems was negative except for: Musculoskeletal: positive for arthralgias   PHYSICAL EXAMINATION: General appearance: alert, cooperative and no distress Neck: no adenopathy Lymph nodes: Cervical, supraclavicular, and axillary nodes normal. Resp: clear to auscultation bilaterally Cardio: regular rate and rhythm, S1, S2 normal, no murmur, click, rub or gallop GI: soft, non-tender; bowel sounds normal; no masses,  no organomegaly Extremities: extremities normal, atraumatic, no cyanosis or edema  ECOG PERFORMANCE STATUS: 1 -  Symptomatic but completely ambulatory  Blood pressure 169/91, pulse 70, temperature 99.1 F (37.3 C), temperature source Oral, height 5' 3.75" (1.619 m), weight 213 lb 3.2 oz (96.707 kg).  LABORATORY DATA: Lab Results  Component Value Date   WBC 7.1 08/14/2011   HGB 11.8 08/14/2011   HCT 37.9 08/14/2011   MCV 82.4 08/14/2011   PLT 311 08/14/2011      Chemistry      Component Value Date/Time   NA 145 08/14/2011 0954   K 4.2 08/14/2011 0954   CL 105 08/14/2011 0954   CO2 24 08/14/2011 0954   BUN 20 08/14/2011 0954   CREATININE 0.74 08/14/2011 0954      Component Value Date/Time   CALCIUM 9.2 08/14/2011 0954   ALKPHOS 107 08/14/2011 0954   AST 17 08/14/2011 0954   ALT 12 08/14/2011 0954   BILITOT 0.4 08/14/2011 0954        RADIOGRAPHIC STUDIES: Mm Digital Screening  07/29/2011  DG SCREEN MAMMOGRAM BILATERAL Bilateral CC and MLO view(s) were taken.  DIGITAL SCREENING MAMMOGRAM WITH CAD: There are scattered fibroglandular densities.  No masses or malignant type calcifications are  identified.  Compared with prior studies.  Images were processed with CAD.  IMPRESSION: No specific mammographic evidence of malignancy.  Next screening mammogram is recommended in one  year.  A result letter of this screening mammogram will be mailed directly to the patient.  ASSESSMENT: Benign - BI-RADS 2  Screening mammogram in 1 year. ,    ASSESSMENT: This is a very pleasant 78 years old African American female with monoclonal gammopathy of undeterminate significance (MGUS). The patient is currently on observation was no evidence for disease progression.  PLAN: I discussed the lab result with the patient and recommended for her continuous observation for now. I would see her back for followup visit in 6 months with repeat CBC, comprehensive metabolic panel, LDH and myeloma panel. For insomnia she was advised to take Tylenol PM on as needed basis. She was advised to call me immediately if she has any concerning symptoms in the interval.  All questions were answered. The patient knows to call the clinic with any problems, questions or concerns. We can certainly see the patient much sooner if necessary.

## 2011-08-17 NOTE — Telephone Encounter (Signed)
Gv pt appt for sept2013 

## 2011-08-18 LAB — COMPREHENSIVE METABOLIC PANEL
ALT: 12 U/L (ref 0–35)
CO2: 24 mEq/L (ref 19–32)
Calcium: 9.2 mg/dL (ref 8.4–10.5)
Chloride: 105 mEq/L (ref 96–112)
Potassium: 4.2 mEq/L (ref 3.5–5.3)
Sodium: 145 mEq/L (ref 135–145)
Total Protein: 6.7 g/dL (ref 6.0–8.3)

## 2011-08-18 LAB — KAPPA/LAMBDA LIGHT CHAINS: Kappa:Lambda Ratio: 1.9 — ABNORMAL HIGH (ref 0.26–1.65)

## 2011-08-18 LAB — LACTATE DEHYDROGENASE: LDH: 165 U/L (ref 94–250)

## 2011-08-18 LAB — BETA 2 MICROGLOBULIN, SERUM: Beta-2 Microglobulin: 1.82 mg/L — ABNORMAL HIGH (ref 1.01–1.73)

## 2011-08-19 DIAGNOSIS — R350 Frequency of micturition: Secondary | ICD-10-CM | POA: Diagnosis not present

## 2011-08-19 DIAGNOSIS — M159 Polyosteoarthritis, unspecified: Secondary | ICD-10-CM | POA: Diagnosis not present

## 2011-08-25 DIAGNOSIS — M6530 Trigger finger, unspecified finger: Secondary | ICD-10-CM | POA: Diagnosis not present

## 2011-09-09 DIAGNOSIS — E119 Type 2 diabetes mellitus without complications: Secondary | ICD-10-CM | POA: Diagnosis not present

## 2011-09-09 DIAGNOSIS — M159 Polyosteoarthritis, unspecified: Secondary | ICD-10-CM | POA: Diagnosis not present

## 2011-10-28 DIAGNOSIS — R259 Unspecified abnormal involuntary movements: Secondary | ICD-10-CM | POA: Diagnosis not present

## 2011-10-28 DIAGNOSIS — E119 Type 2 diabetes mellitus without complications: Secondary | ICD-10-CM | POA: Diagnosis not present

## 2011-10-28 DIAGNOSIS — Z23 Encounter for immunization: Secondary | ICD-10-CM | POA: Diagnosis not present

## 2011-10-28 DIAGNOSIS — I10 Essential (primary) hypertension: Secondary | ICD-10-CM | POA: Diagnosis not present

## 2011-12-09 DIAGNOSIS — E2839 Other primary ovarian failure: Secondary | ICD-10-CM | POA: Diagnosis not present

## 2011-12-09 DIAGNOSIS — E119 Type 2 diabetes mellitus without complications: Secondary | ICD-10-CM | POA: Diagnosis not present

## 2011-12-09 DIAGNOSIS — E538 Deficiency of other specified B group vitamins: Secondary | ICD-10-CM | POA: Diagnosis not present

## 2011-12-09 DIAGNOSIS — I10 Essential (primary) hypertension: Secondary | ICD-10-CM | POA: Diagnosis not present

## 2011-12-09 DIAGNOSIS — R259 Unspecified abnormal involuntary movements: Secondary | ICD-10-CM | POA: Diagnosis not present

## 2011-12-09 DIAGNOSIS — E559 Vitamin D deficiency, unspecified: Secondary | ICD-10-CM | POA: Diagnosis not present

## 2011-12-14 DIAGNOSIS — Z981 Arthrodesis status: Secondary | ICD-10-CM | POA: Diagnosis not present

## 2011-12-14 DIAGNOSIS — M47812 Spondylosis without myelopathy or radiculopathy, cervical region: Secondary | ICD-10-CM | POA: Diagnosis not present

## 2011-12-14 DIAGNOSIS — M542 Cervicalgia: Secondary | ICD-10-CM | POA: Diagnosis not present

## 2011-12-14 DIAGNOSIS — E119 Type 2 diabetes mellitus without complications: Secondary | ICD-10-CM | POA: Diagnosis not present

## 2011-12-30 DIAGNOSIS — R0989 Other specified symptoms and signs involving the circulatory and respiratory systems: Secondary | ICD-10-CM | POA: Diagnosis not present

## 2011-12-30 DIAGNOSIS — R0609 Other forms of dyspnea: Secondary | ICD-10-CM | POA: Diagnosis not present

## 2011-12-30 DIAGNOSIS — R0602 Shortness of breath: Secondary | ICD-10-CM | POA: Diagnosis not present

## 2012-01-14 DIAGNOSIS — R0602 Shortness of breath: Secondary | ICD-10-CM | POA: Diagnosis not present

## 2012-01-14 DIAGNOSIS — R0609 Other forms of dyspnea: Secondary | ICD-10-CM | POA: Diagnosis not present

## 2012-01-21 DIAGNOSIS — M25539 Pain in unspecified wrist: Secondary | ICD-10-CM | POA: Diagnosis not present

## 2012-01-21 DIAGNOSIS — M542 Cervicalgia: Secondary | ICD-10-CM | POA: Diagnosis not present

## 2012-01-21 DIAGNOSIS — S63509A Unspecified sprain of unspecified wrist, initial encounter: Secondary | ICD-10-CM | POA: Diagnosis not present

## 2012-01-21 DIAGNOSIS — M159 Polyosteoarthritis, unspecified: Secondary | ICD-10-CM | POA: Diagnosis not present

## 2012-02-10 ENCOUNTER — Other Ambulatory Visit (HOSPITAL_BASED_OUTPATIENT_CLINIC_OR_DEPARTMENT_OTHER): Payer: Medicare Other | Admitting: Lab

## 2012-02-10 DIAGNOSIS — D472 Monoclonal gammopathy: Secondary | ICD-10-CM | POA: Diagnosis not present

## 2012-02-10 LAB — COMPREHENSIVE METABOLIC PANEL (CC13)
ALT: 12 U/L (ref 0–55)
BUN: 16 mg/dL (ref 7.0–26.0)
CO2: 26 mEq/L (ref 22–29)
Calcium: 9.3 mg/dL (ref 8.4–10.4)
Chloride: 108 mEq/L — ABNORMAL HIGH (ref 98–107)
Creatinine: 0.8 mg/dL (ref 0.6–1.1)
Glucose: 94 mg/dl (ref 70–99)
Total Bilirubin: 0.7 mg/dL (ref 0.20–1.20)

## 2012-02-10 LAB — CBC WITH DIFFERENTIAL/PLATELET
BASO%: 1.1 % (ref 0.0–2.0)
Basophils Absolute: 0.1 10*3/uL (ref 0.0–0.1)
HCT: 36 % (ref 34.8–46.6)
HGB: 11.7 g/dL (ref 11.6–15.9)
MCHC: 32.6 g/dL (ref 31.5–36.0)
MONO#: 0.6 10*3/uL (ref 0.1–0.9)
NEUT%: 67.3 % (ref 38.4–76.8)
RDW: 15.1 % — ABNORMAL HIGH (ref 11.2–14.5)
WBC: 6 10*3/uL (ref 3.9–10.3)
lymph#: 1.2 10*3/uL (ref 0.9–3.3)

## 2012-02-10 LAB — LACTATE DEHYDROGENASE (CC13): LDH: 207 U/L (ref 125–220)

## 2012-02-12 LAB — SPEP & IFE WITH QIG
Albumin ELP: 54.8 % — ABNORMAL LOW (ref 55.8–66.1)
Alpha-1-Globulin: 4.4 % (ref 2.9–4.9)
Beta 2: 4 % (ref 3.2–6.5)
Beta Globulin: 14 % — ABNORMAL HIGH (ref 4.7–7.2)
Gamma Globulin: 12.2 % (ref 11.1–18.8)
IgM, Serum: 78 mg/dL (ref 52–322)

## 2012-02-12 LAB — KAPPA/LAMBDA LIGHT CHAINS
Kappa free light chain: 1.43 mg/dL (ref 0.33–1.94)
Kappa:Lambda Ratio: 0.88 (ref 0.26–1.65)
Lambda Free Lght Chn: 1.63 mg/dL (ref 0.57–2.63)

## 2012-02-17 ENCOUNTER — Other Ambulatory Visit: Payer: Medicare Other | Admitting: Lab

## 2012-02-17 ENCOUNTER — Ambulatory Visit (HOSPITAL_BASED_OUTPATIENT_CLINIC_OR_DEPARTMENT_OTHER): Payer: Medicare Other | Admitting: Internal Medicine

## 2012-02-17 ENCOUNTER — Telehealth: Payer: Self-pay | Admitting: Internal Medicine

## 2012-02-17 VITALS — BP 132/71 | HR 67 | Temp 97.8°F | Resp 20 | Ht 64.0 in | Wt 221.7 lb

## 2012-02-17 DIAGNOSIS — D472 Monoclonal gammopathy: Secondary | ICD-10-CM | POA: Diagnosis not present

## 2012-02-17 NOTE — Patient Instructions (Signed)
Your lab work showed no evidence for disease progression. Followup in one year with repeat myeloma panel.

## 2012-02-17 NOTE — Telephone Encounter (Signed)
gv pt appt schedule for September 2014.  °

## 2012-02-17 NOTE — Progress Notes (Signed)
[facility] Telephone:(336) [phone]   Fax:(336) [phone]  OFFICE PROGRESS NOTE  Juline Patch, MD [address], [address] [place] [address]  PRINCIPAL DIAGNOSIS: Monoclonal gammopathy of undetermined significance (MGUS).   CURRENT THERAPY: Observation.  INTERVAL HISTORY: Julie [name] 78 y.o. female returns to the clinic today for routine six-month followup visit. The patient is feeling fine today with no specific complaints except for arthralgia. She denied having any significant chest pain, shortness breath, cough or hemoptysis. She denied having any significant weight loss or night sweats. No bleeding issues. The patient has repeat CBC, comprehensive metabolic panel, LDH and myeloma panel performed recently and she is here for evaluation and discussion of her lab results.  MEDICAL HISTORY: Past Medical History  Diagnosis Date  . Arthritis   . Blood transfusion   . Cataract   . Heart murmur   . Hyperlipidemia   . Hypertension   . Interstitial cystitis   . Ulcer 1970    duodenal  . GERD (gastroesophageal reflux disease)     ALLERGIES:  is allergic to nitrofurantoin; nitrofurantoin monohyd macro; oxybutynin chloride; and sanctura.  MEDICATIONS:  Current Outpatient Prescriptions  Medication Sig Dispense Refill  . amLODipine-benazepril (LOTREL) 10-20 MG per capsule Take 1 capsule by mouth daily.        . Calcium Carbonate-Vitamin D (CALCIUM 600 + D PO) Take 1 tablet by mouth daily.        . diazepam (VALIUM) 5 MG tablet Take 5 mg by mouth as needed. Muscle spasm       . diclofenac sodium (VOLTAREN) 1 % GEL Apply 1 application topically 4 (four) times daily.      [name] esomeprazole (NEXIUM) 40 MG capsule Take 40 mg by mouth daily before breakfast.        . fish oil-omega-3 fatty acids 1000 MG capsule Take 1 g by mouth daily.        . folic acid (FOLVITE) 1 MG tablet Take 1 mg by mouth daily.        . meloxicam (MOBIC) 15 MG tablet Take 15 mg by mouth  daily.        . metoprolol (LOPRESSOR) 50 MG tablet Take 50 mg by mouth at bedtime.        . Multiple Vitamins-Minerals (MULTIVITAMIN WITH MINERALS) tablet Take 1 tablet by mouth daily.        . pregabalin (LYRICA) 75 MG capsule Take 75 mg by mouth 2 (two) times daily.        . vitamin E 400 UNIT capsule Take 400 Units by mouth daily.          SURGICAL HISTORY:  Past Surgical History  Procedure Date  . Upper gastrointestinal endoscopy   . Colonoscopy   . Vaginal hysterectomy   . Bilateral oophorectomy     REVIEW OF SYSTEMS:  A comprehensive review of systems was negative except for: Musculoskeletal: positive for arthralgias   PHYSICAL EXAMINATION: General appearance: alert, cooperative and no distress Head: Normocephalic, without obvious abnormality, atraumatic Neck: no adenopathy Lymph nodes: Cervical, supraclavicular, and axillary nodes normal. Resp: clear to auscultation bilaterally Cardio: regular rate and rhythm, S1, S2 normal, no murmur, click, rub or gallop GI: soft, non-tender; bowel sounds normal; no masses,  no organomegaly Extremities: extremities normal, atraumatic, no cyanosis or edema  ECOG PERFORMANCE STATUS: 1 - Symptomatic but completely ambulatory  Blood pressure 132/71, pulse 67, temperature 97.8 F (36.6 C), temperature source Oral, resp. rate 20, height 5'  4" (1.626 m), weight 221 lb 11.2 oz (100.562 kg).  LABORATORY DATA: Lab Results  Component Value Date   WBC 6.0 02/10/2012   HGB 11.7 02/10/2012   HCT 36.0 02/10/2012   MCV 80.7 02/10/2012   PLT 218 02/10/2012      Chemistry      Component Value Date/Time   NA 142 02/10/2012 0902   NA 145 08/14/2011 0954   K 4.1 02/10/2012 0902   K 4.2 08/14/2011 0954   CL 108* 02/10/2012 0902   CL 105 08/14/2011 0954   CO2 26 02/10/2012 0902   CO2 24 08/14/2011 0954   BUN 16.0 02/10/2012 0902   BUN 20 08/14/2011 0954   CREATININE 0.8 02/10/2012 0902   CREATININE 0.74 08/14/2011 0954      Component Value Date/Time    CALCIUM 9.3 02/10/2012 0902   CALCIUM 9.2 08/14/2011 0954   ALKPHOS 95 02/10/2012 0902   ALKPHOS 107 08/14/2011 0954   AST 17 02/10/2012 0902   AST 17 08/14/2011 0954   ALT 12 02/10/2012 0902   ALT 12 08/14/2011 0954   BILITOT 0.70 02/10/2012 0902   BILITOT 0.4 08/14/2011 0954       RADIOGRAPHIC STUDIES: No results found.  ASSESSMENT: This is a very pleasant 78 years old [place] American female with history of monoclonal gammopathy of unknown significance currently on observation. The patient has no evidence for disease progression based on the recent blood work.  PLAN: I discussed the lab result with the patient. I recommended for her to continue observation for now with repeat CBC, comprehensive metabolic panel, LDH and myeloma panel in one year. She would come back for followup visit at that time. She was advised to call me immediately if she has any concerning symptoms in the interval.  All questions were answered. The patient knows to call the clinic with any problems, questions or concerns. We can certainly see the patient much sooner if necessary.

## 2012-03-09 DIAGNOSIS — M25539 Pain in unspecified wrist: Secondary | ICD-10-CM | POA: Diagnosis not present

## 2012-03-09 DIAGNOSIS — M25519 Pain in unspecified shoulder: Secondary | ICD-10-CM | POA: Diagnosis not present

## 2012-03-09 DIAGNOSIS — M25569 Pain in unspecified knee: Secondary | ICD-10-CM | POA: Diagnosis not present

## 2012-04-20 DIAGNOSIS — E119 Type 2 diabetes mellitus without complications: Secondary | ICD-10-CM | POA: Diagnosis not present

## 2012-04-26 DIAGNOSIS — E119 Type 2 diabetes mellitus without complications: Secondary | ICD-10-CM | POA: Diagnosis not present

## 2012-04-26 DIAGNOSIS — IMO0002 Reserved for concepts with insufficient information to code with codable children: Secondary | ICD-10-CM | POA: Diagnosis not present

## 2012-04-26 DIAGNOSIS — K219 Gastro-esophageal reflux disease without esophagitis: Secondary | ICD-10-CM | POA: Diagnosis not present

## 2012-04-27 DIAGNOSIS — M25569 Pain in unspecified knee: Secondary | ICD-10-CM | POA: Diagnosis not present

## 2012-04-27 DIAGNOSIS — M25579 Pain in unspecified ankle and joints of unspecified foot: Secondary | ICD-10-CM | POA: Diagnosis not present

## 2012-04-27 DIAGNOSIS — M25549 Pain in joints of unspecified hand: Secondary | ICD-10-CM | POA: Diagnosis not present

## 2012-04-27 DIAGNOSIS — R700 Elevated erythrocyte sedimentation rate: Secondary | ICD-10-CM | POA: Diagnosis not present

## 2012-04-29 DIAGNOSIS — R300 Dysuria: Secondary | ICD-10-CM | POA: Diagnosis not present

## 2012-04-29 DIAGNOSIS — Z111 Encounter for screening for respiratory tuberculosis: Secondary | ICD-10-CM | POA: Diagnosis not present

## 2012-04-29 DIAGNOSIS — R5381 Other malaise: Secondary | ICD-10-CM | POA: Diagnosis not present

## 2012-04-29 DIAGNOSIS — Z79899 Other long term (current) drug therapy: Secondary | ICD-10-CM | POA: Diagnosis not present

## 2012-04-29 DIAGNOSIS — M2550 Pain in unspecified joint: Secondary | ICD-10-CM | POA: Diagnosis not present

## 2012-05-11 DIAGNOSIS — M6010 Interstitial myositis of unspecified site: Secondary | ICD-10-CM | POA: Diagnosis not present

## 2012-05-11 DIAGNOSIS — M1710 Unilateral primary osteoarthritis, unspecified knee: Secondary | ICD-10-CM | POA: Diagnosis not present

## 2012-05-11 DIAGNOSIS — M25569 Pain in unspecified knee: Secondary | ICD-10-CM | POA: Diagnosis not present

## 2012-05-11 DIAGNOSIS — M25549 Pain in joints of unspecified hand: Secondary | ICD-10-CM | POA: Diagnosis not present

## 2012-05-11 DIAGNOSIS — M25579 Pain in unspecified ankle and joints of unspecified foot: Secondary | ICD-10-CM | POA: Diagnosis not present

## 2012-05-31 DIAGNOSIS — E119 Type 2 diabetes mellitus without complications: Secondary | ICD-10-CM | POA: Diagnosis not present

## 2012-05-31 DIAGNOSIS — M064 Inflammatory polyarthropathy: Secondary | ICD-10-CM | POA: Diagnosis not present

## 2012-06-08 ENCOUNTER — Other Ambulatory Visit: Payer: Self-pay | Admitting: Orthopaedic Surgery

## 2012-06-08 DIAGNOSIS — M25579 Pain in unspecified ankle and joints of unspecified foot: Secondary | ICD-10-CM

## 2012-06-08 DIAGNOSIS — R531 Weakness: Secondary | ICD-10-CM

## 2012-06-12 ENCOUNTER — Emergency Department (HOSPITAL_COMMUNITY): Payer: Medicare Other

## 2012-06-12 ENCOUNTER — Emergency Department (HOSPITAL_COMMUNITY)
Admission: EM | Admit: 2012-06-12 | Discharge: 2012-06-12 | Disposition: A | Discharge status: Home / Self Care | Payer: Medicare Other | Attending: Emergency Medicine | Admitting: Emergency Medicine

## 2012-06-12 ENCOUNTER — Encounter (HOSPITAL_COMMUNITY): Payer: Self-pay | Admitting: Emergency Medicine

## 2012-06-12 DIAGNOSIS — Z8719 Personal history of other diseases of the digestive system: Secondary | ICD-10-CM | POA: Diagnosis not present

## 2012-06-12 DIAGNOSIS — Z8639 Personal history of other endocrine, nutritional and metabolic disease: Secondary | ICD-10-CM | POA: Insufficient documentation

## 2012-06-12 DIAGNOSIS — I10 Essential (primary) hypertension: Secondary | ICD-10-CM | POA: Diagnosis not present

## 2012-06-12 DIAGNOSIS — K219 Gastro-esophageal reflux disease without esophagitis: Secondary | ICD-10-CM | POA: Diagnosis not present

## 2012-06-12 DIAGNOSIS — Z87448 Personal history of other diseases of urinary system: Secondary | ICD-10-CM | POA: Insufficient documentation

## 2012-06-12 DIAGNOSIS — R011 Cardiac murmur, unspecified: Secondary | ICD-10-CM | POA: Insufficient documentation

## 2012-06-12 DIAGNOSIS — Z79899 Other long term (current) drug therapy: Secondary | ICD-10-CM | POA: Insufficient documentation

## 2012-06-12 DIAGNOSIS — R42 Dizziness and giddiness: Secondary | ICD-10-CM | POA: Diagnosis not present

## 2012-06-12 DIAGNOSIS — Z862 Personal history of diseases of the blood and blood-forming organs and certain disorders involving the immune mechanism: Secondary | ICD-10-CM | POA: Insufficient documentation

## 2012-06-12 DIAGNOSIS — IMO0002 Reserved for concepts with insufficient information to code with codable children: Secondary | ICD-10-CM | POA: Insufficient documentation

## 2012-06-12 DIAGNOSIS — Z8739 Personal history of other diseases of the musculoskeletal system and connective tissue: Secondary | ICD-10-CM | POA: Diagnosis not present

## 2012-06-12 DIAGNOSIS — Z87891 Personal history of nicotine dependence: Secondary | ICD-10-CM | POA: Diagnosis not present

## 2012-06-12 LAB — URINALYSIS, ROUTINE W REFLEX MICROSCOPIC
Bilirubin Urine: NEGATIVE
Nitrite: NEGATIVE
Specific Gravity, Urine: 1.013 (ref 1.005–1.030)
Urobilinogen, UA: 0.2 mg/dL (ref 0.0–1.0)
pH: 7.5 (ref 5.0–8.0)

## 2012-06-12 LAB — BASIC METABOLIC PANEL
BUN: 18 mg/dL (ref 6–23)
CO2: 28 mEq/L (ref 19–32)
Chloride: 104 mEq/L (ref 96–112)
GFR calc non Af Amer: 81 mL/min — ABNORMAL LOW (ref >90)
Glucose, Bld: 104 mg/dL — ABNORMAL HIGH (ref 70–99)
Potassium: 3.8 mEq/L (ref 3.5–5.1)
Sodium: 141 mEq/L (ref 135–145)

## 2012-06-12 LAB — CBC
HCT: 40 % (ref 36.0–46.0)
Hemoglobin: 12.5 g/dL (ref 12.0–15.0)
MCHC: 31.3 g/dL (ref 30.0–36.0)
RBC: 4.89 MIL/uL (ref 3.87–5.11)
WBC: 6.4 10*3/uL (ref 4.0–10.5)

## 2012-06-12 LAB — URINE MICROSCOPIC-ADD ON

## 2012-06-12 LAB — POCT I-STAT TROPONIN I: Troponin i, poc: 0.01 ng/mL (ref 0.00–0.08)

## 2012-06-12 MED ORDER — SODIUM CHLORIDE 0.9 % IV BOLUS (SEPSIS)
1000.0000 mL | Freq: Once | INTRAVENOUS | Status: AC
  Administered 2012-06-12: 1000 mL via INTRAVENOUS

## 2012-06-12 NOTE — ED Notes (Signed)
Noted while walking patient looked like she required assistance even with using her cane. At this point patient told me she has been taking tums for 3 weeks due to indegestion.

## 2012-06-12 NOTE — ED Provider Notes (Signed)
History     CSN: [id number]  Arrival date & time 06/12/12  [id number]   First MD Initiated Contact with Patient 06/12/12 [phone]      Chief Complaint  Patient presents with  . Weakness  . Chest Pain     The history is provided by the patient.   patient reports approximately 12 hours of dizziness and didn't stability on her feet.  She denies significant chest pain or shortness of breath.  No nausea vomiting or diarrhea.  No fevers or chills.  The patient did have a sensation of heartburn earlier today which has resolved.  No palpitations.  Her recent falls or trauma.  No weakness of her upper lower extremities.  Past Medical History  Diagnosis Date  . Arthritis   . Blood transfusion   . Cataract   . Heart murmur   . Hyperlipidemia   . Hypertension   . Interstitial cystitis   . Ulcer 1970    duodenal  . GERD (gastroesophageal reflux disease)     Past Surgical History  Procedure Date  . Upper gastrointestinal endoscopy   . Colonoscopy   . Vaginal hysterectomy   . Bilateral oophorectomy     Family History  Problem Relation Age of Onset  . Colon cancer Sister   . Esophageal cancer Brother   . Breast cancer Mother     History  Substance Use Topics  . Smoking status: Former Games developer  . Smokeless tobacco: Never Used  . Alcohol Use: No     Comment: rarely wine    OB History    Grav Para Term Preterm Abortions TAB SAB Ect Mult Living                  Review of Systems  All other systems reviewed and are negative.    Allergies  Nitrofurantoin; Nitrofurantoin monohyd macro; Oxybutynin chloride; and Sanctura  Home Medications   Current Outpatient Rx  Name  Route  Sig  Dispense  Refill  . AMLODIPINE BESY-BENAZEPRIL HCL 10-20 MG PO CAPS   Oral   Take 1 capsule by mouth every morning.          [name] CALCIUM CARBONATE-VITAMIN D 500-200 MG-UNIT PO TABS   Oral   Take 1 tablet by mouth every morning.         [name] COLCHICINE 0.6 MG PO TABS   Oral   Take 0.6 mg by mouth  2 (two) times daily.         [name] DIAZEPAM 5 MG PO TABS   Oral   Take 5 mg by mouth 2 (two) times daily as needed. Muscle spasm         . ESOMEPRAZOLE MAGNESIUM 40 MG PO CPDR   Oral   Take 40 mg by mouth daily before breakfast.           . OMEGA-3 FATTY ACIDS 1000 MG PO CAPS   Oral   Take 1 g by mouth every morning.          [name] FOLIC ACID 1 MG PO TABS   Oral   Take 1 mg by mouth every morning.          [name] HYDROCODONE-ACETAMINOPHEN 5-325 MG PO TABS   Oral   Take 1 tablet by mouth 2 (two) times daily as needed. For pain         . LIDOCAINE 5 % EX PTCH   Transdermal   Place 1 patch onto the skin daily. Remove & Discard  patch within 12 hours or as directed by MD         . MELOXICAM 15 MG PO TABS   Oral   Take 15 mg by mouth at bedtime.         [name] METFORMIN HCL 500 MG PO TABS   Oral   Take 500 mg by mouth daily with supper.         [name] METOPROLOL SUCCINATE ER 50 MG PO TB24   Oral   Take 50 mg by mouth at bedtime. Take with or immediately following a meal.         . MULTI-VITAMIN/MINERALS PO TABS   Oral   Take 1 tablet by mouth every morning.          [name] PREDNISONE 5 MG PO TABS   Oral   Take 2.5-10 mg by mouth daily. Taper down         . PREGABALIN 75 MG PO CAPS   Oral   Take 75 mg by mouth 2 (two) times daily.         [name] VITAMIN E 400 UNITS PO CAPS   Oral   Take 400 Units by mouth every morning.            BP 153/63  Pulse 53  Temp 97.7 F (36.5 C) (Oral)  Resp 16  SpO2 100%  Physical Exam  Nursing note and vitals reviewed. Constitutional: She is oriented to person, place, and time. She appears well-developed and well-nourished. No distress.  HENT:  Head: Normocephalic and atraumatic.  Eyes: EOM are normal. Pupils are equal, round, and reactive to light.  Neck: Normal range of motion.  Cardiovascular: Normal rate, regular rhythm and normal heart sounds.   Pulmonary/Chest: Effort normal and breath sounds normal.  Abdominal: Soft. She  exhibits no distension. There is no tenderness.  Musculoskeletal: Normal range of motion.  Neurological: She is alert and oriented to person, place, and time.       5/5 strength in major muscle groups of  bilateral upper and lower extremities. Speech normal. No facial asymetry.   Skin: Skin is warm and dry.  Psychiatric: She has a normal mood and affect. Judgment normal.    ED Course  Procedures (including critical care time)  Labs Reviewed  CBC - Abnormal; Notable for the following:    MCH 25.6 (*)     RDW 17.5 (*)     All other components within normal limits  BASIC METABOLIC PANEL - Abnormal; Notable for the following:    Glucose, Bld 104 (*)     GFR calc non Af Amer 81 (*)     All other components within normal limits  URINALYSIS, ROUTINE W REFLEX MICROSCOPIC - Abnormal; Notable for the following:    APPearance CLOUDY (*)     Hgb urine dipstick TRACE (*)     Leukocytes, UA SMALL (*)     All other components within normal limits  POCT I-STAT TROPONIN I  URINE MICROSCOPIC-ADD ON   Ct Head Wo Contrast  06/12/2012  *RADIOLOGY REPORT*  Clinical Data: Disequilibrium.  Cardiac irregularity.  CT HEAD WITHOUT CONTRAST  Technique:  Contiguous axial images were obtained from the base of the skull through the vertex without contrast.  Comparison: 03/17/2011.  Findings: There is no evidence for acute infarction, intracranial hemorrhage, mass lesion, hydrocephalus, or extra-axial fluid.  Mild atrophy. Moderate chronic microvascular ischemic change.  Focal sessile  left frontal hyperostosis likely osteoma or heavily calcified meningioma, without underlying neural  compression.  Carotid and vertebral atherosclerosis.  No CT signs of proximal vascular thrombosis.  No visible cortical infarction.  No acute sinus or mastoid disease.  Similar appearance to priors.  IMPRESSION: Mild atrophy and moderate chronic microvascular ischemic change. No acute intracranial findings.  Stable appearance from priors.    Original Report Authenticated By: Davonna Belling, M.D.    Mr Brain Wo Contrast  06/12/2012  *RADIOLOGY REPORT*  Clinical Data: Dizziness for one day.  History of hypertension. History of hyperlipidemia.  MRI HEAD WITHOUT CONTRAST  Technique:  Multiplanar, multiecho pulse sequences of the brain and surrounding structures were obtained according to standard protocol without intravenous contrast.  Comparison: CT head 06/12/2012.  MR head 12/28/2007.  Findings: There is no evidence for acute infarction, intracranial hemorrhage, mass lesion, hydrocephalus, or extra-axial fluid. There is moderate cerebral and cerebellar atrophy.  There is extensive chronic microvascular ischemic change affecting the periventricular and subcortical white matter.  Prominent perivascular spaces reflect longstanding hypertension.  No foci of chronic hemorrhage.  No midline shift.  Pituitary and cerebellar tonsils are within normal limits.  No upper cervical pathology is evident.  The skull base and calvarium are grossly unremarkable. No visible sinus or mastoid disease.  Negative orbits.  Similar appearance to prior MR and CT.  IMPRESSION: Moderately advanced atrophy and chronic microvascular ischemic change likely hypertensive cerebrovascular disease.  No acute intracranial findings.  No acute stroke or hemorrhage.   Original Report Authenticated By: Davonna Belling, M.D.    I personally reviewed the imaging tests through PACS system I reviewed available ER/hospitalization records through the EMR    1. Dizziness       MDM  The patient is well-appearing.  She can ambulate with some assistance.  She has a cane.  She also given a prescription for a walker.        Lyanne Co, MD 06/12/12 [phone]

## 2012-06-12 NOTE — ED Notes (Addendum)
Reports last night  around 1200am -100am while in the bed got dizzy able to sleep without problems. This am while getting ready to go to church patient felt disoriented (i.e got lost in home, having to hold on to the wall felt would fall - uses cane, like seeing stars). Patient called daughter. Daughter reports her Mother was already dressed & unsteady to her feet.  Patient reports FSBS 110mg /dl & bp 161/09 hr 61.

## 2012-06-12 NOTE — ED Notes (Signed)
Pt presenting to ed with c/o "my heart feels dirty like it doesn't belong to me" pt states she feels like her equilibrium is off. Pt states she feels dizzy. Pt states intermittent shortness of breath. Pt states positive nausea no vomiting.

## 2012-06-12 NOTE — ED Notes (Signed)
Patient transported to MRI by Felipa Eth, tech

## 2012-06-12 NOTE — ED Notes (Signed)
Patient transported to MRI 

## 2012-06-13 ENCOUNTER — Ambulatory Visit
Admission: RE | Admit: 2012-06-13 | Discharge: 2012-06-13 | Disposition: A | Discharge status: Home / Self Care | Payer: Medicare Other | Source: Ambulatory Visit | Attending: Orthopaedic Surgery | Admitting: Orthopaedic Surgery

## 2012-06-13 DIAGNOSIS — R531 Weakness: Secondary | ICD-10-CM

## 2012-06-13 DIAGNOSIS — M19079 Primary osteoarthritis, unspecified ankle and foot: Secondary | ICD-10-CM | POA: Diagnosis not present

## 2012-06-13 DIAGNOSIS — M25579 Pain in unspecified ankle and joints of unspecified foot: Secondary | ICD-10-CM

## 2012-06-20 DIAGNOSIS — R197 Diarrhea, unspecified: Secondary | ICD-10-CM | POA: Diagnosis not present

## 2012-06-20 DIAGNOSIS — F29 Unspecified psychosis not due to a substance or known physiological condition: Secondary | ICD-10-CM | POA: Diagnosis not present

## 2012-06-20 DIAGNOSIS — R29818 Other symptoms and signs involving the nervous system: Secondary | ICD-10-CM | POA: Diagnosis not present

## 2012-06-23 ENCOUNTER — Other Ambulatory Visit: Payer: Self-pay | Admitting: Internal Medicine

## 2012-06-23 DIAGNOSIS — Z1231 Encounter for screening mammogram for malignant neoplasm of breast: Secondary | ICD-10-CM

## 2012-06-30 DIAGNOSIS — M1990 Unspecified osteoarthritis, unspecified site: Secondary | ICD-10-CM | POA: Diagnosis not present

## 2012-06-30 DIAGNOSIS — H6120 Impacted cerumen, unspecified ear: Secondary | ICD-10-CM | POA: Diagnosis not present

## 2012-06-30 DIAGNOSIS — Z0000 Encounter for general adult medical examination without abnormal findings: Secondary | ICD-10-CM | POA: Diagnosis not present

## 2012-06-30 DIAGNOSIS — IMO0001 Reserved for inherently not codable concepts without codable children: Secondary | ICD-10-CM | POA: Diagnosis not present

## 2012-07-29 ENCOUNTER — Ambulatory Visit: Payer: Medicare Other

## 2012-08-31 ENCOUNTER — Ambulatory Visit
Admission: RE | Admit: 2012-08-31 | Discharge: 2012-08-31 | Disposition: A | Discharge status: Home / Self Care | Payer: Medicare Other | Source: Ambulatory Visit | Attending: Internal Medicine | Admitting: Internal Medicine

## 2012-08-31 DIAGNOSIS — Z1231 Encounter for screening mammogram for malignant neoplasm of breast: Secondary | ICD-10-CM

## 2012-09-23 DIAGNOSIS — M129 Arthropathy, unspecified: Secondary | ICD-10-CM | POA: Diagnosis not present

## 2012-09-23 DIAGNOSIS — E119 Type 2 diabetes mellitus without complications: Secondary | ICD-10-CM | POA: Diagnosis not present

## 2012-09-23 DIAGNOSIS — E559 Vitamin D deficiency, unspecified: Secondary | ICD-10-CM | POA: Diagnosis not present

## 2012-09-29 DIAGNOSIS — E7800 Pure hypercholesterolemia, unspecified: Secondary | ICD-10-CM | POA: Diagnosis not present

## 2012-09-29 DIAGNOSIS — R809 Proteinuria, unspecified: Secondary | ICD-10-CM | POA: Diagnosis not present

## 2012-10-31 DIAGNOSIS — IMO0001 Reserved for inherently not codable concepts without codable children: Secondary | ICD-10-CM | POA: Diagnosis not present

## 2012-10-31 DIAGNOSIS — I10 Essential (primary) hypertension: Secondary | ICD-10-CM | POA: Diagnosis not present

## 2012-10-31 DIAGNOSIS — E119 Type 2 diabetes mellitus without complications: Secondary | ICD-10-CM | POA: Diagnosis not present

## 2012-10-31 DIAGNOSIS — Z006 Encounter for examination for normal comparison and control in clinical research program: Secondary | ICD-10-CM | POA: Diagnosis not present

## 2012-10-31 DIAGNOSIS — K219 Gastro-esophageal reflux disease without esophagitis: Secondary | ICD-10-CM | POA: Diagnosis not present

## 2012-10-31 DIAGNOSIS — IMO0002 Reserved for concepts with insufficient information to code with codable children: Secondary | ICD-10-CM | POA: Diagnosis not present

## 2012-11-07 DIAGNOSIS — H6120 Impacted cerumen, unspecified ear: Secondary | ICD-10-CM | POA: Diagnosis not present

## 2012-11-07 DIAGNOSIS — G589 Mononeuropathy, unspecified: Secondary | ICD-10-CM | POA: Diagnosis not present

## 2012-11-08 ENCOUNTER — Telehealth: Payer: Self-pay | Admitting: *Deleted

## 2012-11-08 NOTE — Telephone Encounter (Signed)
Lab results from [facility] 09/23/12 given to Dr Donnald Garre to review.

## 2012-11-17 DIAGNOSIS — M25549 Pain in joints of unspecified hand: Secondary | ICD-10-CM | POA: Diagnosis not present

## 2012-11-17 DIAGNOSIS — M1120 Other chondrocalcinosis, unspecified site: Secondary | ICD-10-CM | POA: Diagnosis not present

## 2012-11-17 DIAGNOSIS — M25579 Pain in unspecified ankle and joints of unspecified foot: Secondary | ICD-10-CM | POA: Diagnosis not present

## 2012-11-17 DIAGNOSIS — M069 Rheumatoid arthritis, unspecified: Secondary | ICD-10-CM | POA: Diagnosis not present

## 2012-11-17 DIAGNOSIS — Z111 Encounter for screening for respiratory tuberculosis: Secondary | ICD-10-CM | POA: Diagnosis not present

## 2012-11-17 DIAGNOSIS — Z79899 Other long term (current) drug therapy: Secondary | ICD-10-CM | POA: Diagnosis not present

## 2012-11-17 DIAGNOSIS — R300 Dysuria: Secondary | ICD-10-CM | POA: Diagnosis not present

## 2012-12-26 DIAGNOSIS — H2510 Age-related nuclear cataract, unspecified eye: Secondary | ICD-10-CM | POA: Diagnosis not present

## 2012-12-26 DIAGNOSIS — E119 Type 2 diabetes mellitus without complications: Secondary | ICD-10-CM | POA: Diagnosis not present

## 2012-12-26 DIAGNOSIS — H40019 Open angle with borderline findings, low risk, unspecified eye: Secondary | ICD-10-CM | POA: Diagnosis not present

## 2012-12-26 DIAGNOSIS — H26019 Infantile and juvenile cortical, lamellar, or zonular cataract, unspecified eye: Secondary | ICD-10-CM | POA: Diagnosis not present

## 2012-12-29 DIAGNOSIS — I10 Essential (primary) hypertension: Secondary | ICD-10-CM | POA: Diagnosis not present

## 2012-12-29 DIAGNOSIS — E119 Type 2 diabetes mellitus without complications: Secondary | ICD-10-CM | POA: Diagnosis not present

## 2013-01-02 DIAGNOSIS — I10 Essential (primary) hypertension: Secondary | ICD-10-CM | POA: Diagnosis not present

## 2013-01-02 DIAGNOSIS — E119 Type 2 diabetes mellitus without complications: Secondary | ICD-10-CM | POA: Diagnosis not present

## 2013-01-02 DIAGNOSIS — M25579 Pain in unspecified ankle and joints of unspecified foot: Secondary | ICD-10-CM | POA: Diagnosis not present

## 2013-01-02 DIAGNOSIS — M129 Arthropathy, unspecified: Secondary | ICD-10-CM | POA: Diagnosis not present

## 2013-01-09 ENCOUNTER — Telehealth: Payer: Self-pay | Admitting: Internal Medicine

## 2013-01-09 NOTE — Telephone Encounter (Signed)
pt called to r/s appt due to going out of town.Marland KitchenMarland KitchenDone

## 2013-01-19 DIAGNOSIS — Z23 Encounter for immunization: Secondary | ICD-10-CM | POA: Diagnosis not present

## 2013-01-20 DIAGNOSIS — M069 Rheumatoid arthritis, unspecified: Secondary | ICD-10-CM | POA: Diagnosis not present

## 2013-02-08 ENCOUNTER — Other Ambulatory Visit: Payer: Medicare Other | Admitting: Lab

## 2013-02-13 ENCOUNTER — Telehealth: Payer: Self-pay | Admitting: Internal Medicine

## 2013-02-13 NOTE — Telephone Encounter (Signed)
Pt called and r/s lab and MD to end of October 2014

## 2013-02-15 ENCOUNTER — Ambulatory Visit: Payer: Medicare Other | Admitting: Internal Medicine

## 2013-02-17 ENCOUNTER — Other Ambulatory Visit: Payer: Medicare Other

## 2013-02-23 ENCOUNTER — Ambulatory Visit: Payer: Medicare Other | Admitting: Internal Medicine

## 2013-03-14 ENCOUNTER — Other Ambulatory Visit: Payer: Self-pay | Admitting: *Deleted

## 2013-03-14 ENCOUNTER — Telehealth: Payer: Self-pay | Admitting: Internal Medicine

## 2013-03-14 DIAGNOSIS — D472 Monoclonal gammopathy: Secondary | ICD-10-CM

## 2013-03-14 NOTE — Telephone Encounter (Signed)
returned pt call and lvm advised on OCT appts.Julie KitchenMarland KitchenMarland [name]

## 2013-03-15 ENCOUNTER — Other Ambulatory Visit (HOSPITAL_BASED_OUTPATIENT_CLINIC_OR_DEPARTMENT_OTHER): Payer: Medicare Other | Admitting: Lab

## 2013-03-15 DIAGNOSIS — D472 Monoclonal gammopathy: Secondary | ICD-10-CM | POA: Diagnosis not present

## 2013-03-15 LAB — COMPREHENSIVE METABOLIC PANEL (CC13)
AST: 15 U/L (ref 5–34)
Albumin: 3 g/dL — ABNORMAL LOW (ref 3.5–5.0)
Anion Gap: 8 mEq/L (ref 3–11)
BUN: 18.2 mg/dL (ref 7.0–26.0)
CO2: 26 mEq/L (ref 22–29)
Calcium: 9.1 mg/dL (ref 8.4–10.4)
Chloride: 109 mEq/L (ref 98–109)
Creatinine: 0.8 mg/dL (ref 0.6–1.1)
Glucose: 92 mg/dl (ref 70–140)
Potassium: 3.8 mEq/L (ref 3.5–5.1)

## 2013-03-15 LAB — CBC WITH DIFFERENTIAL/PLATELET
Basophils Absolute: 0.1 10*3/uL (ref 0.0–0.1)
EOS%: 4 % (ref 0.0–7.0)
Eosinophils Absolute: 0.2 10*3/uL (ref 0.0–0.5)
HCT: 33.2 % — ABNORMAL LOW (ref 34.8–46.6)
HGB: 10.6 g/dL — ABNORMAL LOW (ref 11.6–15.9)
MONO#: 0.5 10*3/uL (ref 0.1–0.9)
NEUT#: 3.8 10*3/uL (ref 1.5–6.5)
NEUT%: 65.5 % (ref 38.4–76.8)
RDW: 17 % — ABNORMAL HIGH (ref 11.2–14.5)
WBC: 5.8 10*3/uL (ref 3.9–10.3)
lymph#: 1.2 10*3/uL (ref 0.9–3.3)

## 2013-03-16 LAB — KAPPA/LAMBDA LIGHT CHAINS
Kappa:Lambda Ratio: 2.55 — ABNORMAL HIGH (ref 0.26–1.65)
Lambda Free Lght Chn: 1.79 mg/dL (ref 0.57–2.63)

## 2013-03-16 LAB — IGG, IGA, IGM: IgM, Serum: 56 mg/dL (ref 52–322)

## 2013-03-20 ENCOUNTER — Encounter (INDEPENDENT_AMBULATORY_CARE_PROVIDER_SITE_OTHER): Payer: Self-pay

## 2013-03-20 ENCOUNTER — Telehealth: Payer: Self-pay | Admitting: Internal Medicine

## 2013-03-20 ENCOUNTER — Ambulatory Visit (HOSPITAL_BASED_OUTPATIENT_CLINIC_OR_DEPARTMENT_OTHER): Payer: Medicare Other | Admitting: Internal Medicine

## 2013-03-20 ENCOUNTER — Encounter: Payer: Self-pay | Admitting: Internal Medicine

## 2013-03-20 VITALS — BP 140/71 | HR 53 | Temp 97.2°F | Resp 18 | Ht 64.0 in | Wt 208.8 lb

## 2013-03-20 DIAGNOSIS — R52 Pain, unspecified: Secondary | ICD-10-CM

## 2013-03-20 DIAGNOSIS — D472 Monoclonal gammopathy: Secondary | ICD-10-CM | POA: Diagnosis not present

## 2013-03-20 DIAGNOSIS — M2550 Pain in unspecified joint: Secondary | ICD-10-CM | POA: Diagnosis not present

## 2013-03-20 DIAGNOSIS — D649 Anemia, unspecified: Secondary | ICD-10-CM | POA: Diagnosis not present

## 2013-03-20 NOTE — Progress Notes (Signed)
[facility] [phone]   [phone]  OFFICE PROGRESS NOTE  Juline Patch, MD [address], [address] [place] [address]  PRINCIPAL DIAGNOSIS: Monoclonal gammopathy of undetermined significance (MGUS).    CURRENT THERAPY: Observation.   INTERVAL HISTORY:  Julie [name] 79 y.o. female returns to the clinic today for routine six-month followup visit. The patient is feeling fine today with no specific complaints except for arthralgia and generalized pain. She is currently under the care of Dr. Dareen Piano for treatment of rheumatoid arthritis and fibromyalgia. She is treated with methotrexate and Vicodin. She denied having any significant chest pain, shortness breath, cough or hemoptysis. She denied having any significant weight loss or night sweats. No bleeding issues. The patient has repeat CBC, comprehensive metabolic panel, LDH and myeloma panel performed recently and she is here for evaluation and discussion of her lab results.   MEDICAL HISTORY: Past Medical History  Diagnosis Date  . Arthritis   . Blood transfusion   . Cataract   . Heart murmur   . Hyperlipidemia   . Hypertension   . Interstitial cystitis   . Ulcer 1970    duodenal  . GERD (gastroesophageal reflux disease)     ALLERGIES:  is allergic to nitrofurantoin; nitrofurantoin monohyd macro; oxybutynin chloride; and sanctura.  MEDICATIONS:  Current Outpatient Prescriptions  Medication Sig Dispense Refill  . amLODipine-benazepril (LOTREL) 10-20 MG per capsule Take 1 capsule by mouth every morning.       . calcium-vitamin D (OSCAL WITH D) 500-200 MG-UNIT per tablet Take 1 tablet by mouth every morning.      [name] esomeprazole (NEXIUM) 40 MG capsule Take 40 mg by mouth daily before breakfast.        . fish oil-omega-3 fatty acids 1000 MG capsule Take 1 g by mouth every morning.       . folic acid (FOLVITE) 1 MG tablet Take 1 mg by mouth every morning.       [name]  HYDROcodone-acetaminophen (NORCO/VICODIN) 5-325 MG per tablet Take 1 tablet by mouth 2 (two) times daily as needed. For pain      . lidocaine (LIDODERM) 5 % Place 1 patch onto the skin daily. Remove & Discard patch within 12 hours or as directed by MD      . losartan (COZAAR) 100 MG tablet Take 100 mg by mouth daily.      . meloxicam (MOBIC) 15 MG tablet Take 15 mg by mouth at bedtime.      . metFORMIN (GLUCOPHAGE) 500 MG tablet Take 500 mg by mouth daily with supper.      . Methotrexate, Anti-Rheumatic, (METHOTREXATE, PF, [place]) Inject 0.4 mLs into the skin once a week. On friday      . metoprolol succinate (TOPROL-XL) 50 MG 24 hr tablet Take 50 mg by mouth at bedtime. Take with or immediately following a meal.      . Multiple Vitamins-Minerals (MULTIVITAMIN WITH MINERALS) tablet Take 1 tablet by mouth every morning.       . pregabalin (LYRICA) 75 MG capsule Take 75 mg by mouth 2 (two) times daily.      . colchicine 0.6 MG tablet Take 0.6 mg by mouth 2 (two) times daily.      . diazepam (VALIUM) 5 MG tablet Take 5 mg by mouth 2 (two) times daily as needed. Muscle spasm       No current facility-administered medications for this visit.    SURGICAL HISTORY:  Past Surgical  History  Procedure Laterality Date  . Upper gastrointestinal endoscopy    . Colonoscopy    . Vaginal hysterectomy    . Bilateral oophorectomy      REVIEW OF SYSTEMS:  A comprehensive review of systems was negative except for: Constitutional: positive for fatigue Musculoskeletal: positive for arthralgias and myalgias   PHYSICAL EXAMINATION: General appearance: alert, cooperative, fatigued and no distress Head: Normocephalic, without obvious abnormality, atraumatic Neck: no adenopathy, no JVD, supple, symmetrical, trachea midline and thyroid not enlarged, symmetric, no tenderness/mass/nodules Lymph nodes: Cervical, supraclavicular, and axillary nodes normal. Resp: clear to auscultation bilaterally Back: symmetric, no  curvature. ROM normal. No CVA tenderness. Cardio: regular rate and rhythm, S1, S2 normal, no murmur, click, rub or gallop GI: soft, non-tender; bowel sounds normal; no masses,  no organomegaly Extremities: extremities normal, atraumatic, no cyanosis or edema  ECOG PERFORMANCE STATUS: 1 - Symptomatic but completely ambulatory  Blood pressure 140/71, pulse 53, temperature 97.2 F (36.2 C), temperature source Oral, resp. rate 18, height 5\' 4"  (1.626 m), weight 208 lb 12.8 oz (94.711 kg).  LABORATORY DATA: Lab Results  Component Value Date   WBC 5.8 03/15/2013   HGB 10.6* 03/15/2013   HCT 33.2* 03/15/2013   MCV 78.5* 03/15/2013   PLT 293 03/15/2013      Chemistry      Component Value Date/Time   NA 143 03/15/2013 0819   NA 141 06/12/2012 0912   K 3.8 03/15/2013 0819   K 3.8 06/12/2012 0912   CL 104 06/12/2012 0912   CL 108* 02/10/2012 0902   CO2 26 03/15/2013 0819   CO2 28 06/12/2012 0912   BUN 18.2 03/15/2013 0819   BUN 18 06/12/2012 0912   CREATININE 0.8 03/15/2013 0819   CREATININE 0.71 06/12/2012 0912      Component Value Date/Time   CALCIUM 9.1 03/15/2013 0819   CALCIUM 9.2 06/12/2012 0912   ALKPHOS 91 03/15/2013 0819   ALKPHOS 107 08/14/2011 0954   AST 15 03/15/2013 0819   AST 17 08/14/2011 0954   ALT 8 03/15/2013 0819   ALT 12 08/14/2011 0954   BILITOT 0.48 03/15/2013 0819   BILITOT 0.4 08/14/2011 0954       RADIOGRAPHIC STUDIES: No results found.  ASSESSMENT AND PLAN:   ASSESSMENT: This is a very pleasant 79 years old [place] American female with history of monoclonal gammopathy of unknown significance currently on observation. The patient has no evidence for disease progression based on the recent blood work, except for slightly elevated free kappa light chain and mild anemia.   PLAN: I discussed the lab result with the patient. I recommended for her to continue observation for now with repeat CBC, comprehensive metabolic panel, LDH and myeloma panel in 4 months.  She would come back for followup visit at that time. She was advised to call me immediately if she has any concerning symptoms in the interval.  All questions were answered. The patient knows to call the clinic with any problems, questions or concerns. We can certainly see the patient much sooner if necessary. The patient voices understanding of current disease status and treatment options and is in agreement with the current care plan.  All questions were answered. The patient knows to call the clinic with any problems, questions or concerns. We can certainly see the patient much sooner if necessary.

## 2013-03-20 NOTE — Patient Instructions (Signed)
Followup visit in 4 months with repeat myeloma panel.

## 2013-03-20 NOTE — Telephone Encounter (Signed)
Per 10/27 POF sw pt aware of visits in 02/15 and calendar mailed shh

## 2013-03-24 DIAGNOSIS — M069 Rheumatoid arthritis, unspecified: Secondary | ICD-10-CM | POA: Diagnosis not present

## 2013-04-05 ENCOUNTER — Telehealth: Payer: Self-pay | Admitting: *Deleted

## 2013-04-05 DIAGNOSIS — D649 Anemia, unspecified: Secondary | ICD-10-CM | POA: Diagnosis not present

## 2013-04-05 DIAGNOSIS — E119 Type 2 diabetes mellitus without complications: Secondary | ICD-10-CM | POA: Diagnosis not present

## 2013-04-05 NOTE — Telephone Encounter (Signed)
Lab work from Dr [employer] office given to Dr Donnald Garre to review.  SLJ

## 2013-06-06 DIAGNOSIS — L600 Ingrowing nail: Secondary | ICD-10-CM | POA: Diagnosis not present

## 2013-06-06 DIAGNOSIS — M25579 Pain in unspecified ankle and joints of unspecified foot: Secondary | ICD-10-CM | POA: Diagnosis not present

## 2013-06-21 ENCOUNTER — Ambulatory Visit (INDEPENDENT_AMBULATORY_CARE_PROVIDER_SITE_OTHER): Payer: Medicare Other | Admitting: Podiatry

## 2013-06-21 ENCOUNTER — Encounter: Payer: Self-pay | Admitting: Podiatry

## 2013-06-21 VITALS — BP 130/64 | HR 60 | Resp 18

## 2013-06-21 DIAGNOSIS — M79609 Pain in unspecified limb: Secondary | ICD-10-CM

## 2013-06-21 DIAGNOSIS — B351 Tinea unguium: Secondary | ICD-10-CM

## 2013-06-21 NOTE — Progress Notes (Signed)
   Subjective:    Patient ID: Julie [name], female    DOB: 16-May-1933, 80 y.o.   MRN: [id number]  HPI my right big toenail is curling in and has been going on for about 3 months and burns and throbbing and hurts with shoes and in the bed and sore and tender and I am a diabetic for about 10 years now    Review of Systems  Constitutional: Positive for appetite change and unexpected weight change.  HENT: Positive for hearing loss.        Ringing in ears  Eyes: Positive for visual disturbance.  Respiratory: Positive for cough.   Musculoskeletal: Positive for gait problem.       Joint pain and muscle pain  All other systems reviewed and are negative.       Objective:   Physical Exam  Orientated x64 year old female  Vascular: DP pulses are 2/4 bilaterally. PT pulses 1/4 bilaterally  Neurological: Sensation detained in monofilament wire intact 9/10 right and 8/10 left. Knee and ankle reflexes trace reactive bilaterally  Dermatological. Texture and turgor within normal limits. Surgical scars first MPJs bilaterally. The medial margin of the right hallux toenail is mildly incurvated with a callus nail groove  Musculoskeletal: Low arch contour noted. Mildly edematous left ankle noted        Assessment & Plan:   Assessment: Callus medial nail groove with associated low-grade onychomycoses right hallux Vascular status is diminished because of decreased posterior tibial pulses bilaterally Sensory neuropathy bilaterally Edematous left ankle that needs orthopedic evaluation  Plan: The medial margin of the right hallux nail is debrided without any bleeding and packed with antibiotic ointment. Advised patient to continue applied topical antibiotic ointment or Vaseline to the medial margin of the right hallux nail. I referred patient agrees for orthopedic Dr. Wylene Simmer to evaluate left ankle.

## 2013-06-21 NOTE — Patient Instructions (Signed)
Dr.Hewett at [place] for ankle pain    Diabetes and Foot Care Diabetes may cause you to have problems because of poor blood supply (circulation) to your feet and legs. This may cause the skin on your feet to become thinner, break easier, and heal more slowly. Your skin may become dry, and the skin may peel and crack. You may also have nerve damage in your legs and feet causing decreased feeling in them. You may not notice minor injuries to your feet that could lead to infections or more serious problems. Taking care of your feet is one of the most important things you can do for yourself.  HOME CARE INSTRUCTIONS  Wear shoes at all times, even in the house. Do not go barefoot. Bare feet are easily injured.  Check your feet daily for blisters, cuts, and redness. If you cannot see the bottom of your feet, use a mirror or ask someone for help.  Wash your feet with warm water (do not use hot water) and mild soap. Then pat your feet and the areas between your toes until they are completely dry. Do not soak your feet as this can dry your skin.  Apply a moisturizing lotion or petroleum jelly (that does not contain alcohol and is unscented) to the skin on your feet and to dry, brittle toenails. Do not apply lotion between your toes.  Trim your toenails straight across. Do not dig under them or around the cuticle. File the edges of your nails with an emery board or nail file.  Do not cut corns or calluses or try to remove them with medicine.  Wear clean socks or stockings every day. Make sure they are not too tight. Do not wear knee-high stockings since they may decrease blood flow to your legs.  Wear shoes that fit properly and have enough cushioning. To break in new shoes, wear them for just a few hours a day. This prevents you from injuring your feet. Always look in your shoes before you put them on to be sure there are no objects inside.  Do not cross your legs. This may decrease  the blood flow to your feet.  If you find a minor scrape, cut, or break in the skin on your feet, keep it and the skin around it clean and dry. These areas may be cleansed with mild soap and water. Do not cleanse the area with peroxide, alcohol, or iodine.  When you remove an adhesive bandage, be sure not to damage the skin around it.  If you have a wound, look at it several times a day to make sure it is healing.  Do not use heating pads or hot water bottles. They may burn your skin. If you have lost feeling in your feet or legs, you may not know it is happening until it is too late.  Make sure your health care provider performs a complete foot exam at least annually or more often if you have foot problems. Report any cuts, sores, or bruises to your health care provider immediately. SEEK MEDICAL CARE IF:   You have an injury that is not healing.  You have cuts or breaks in the skin.  You have an ingrown nail.  You notice redness on your legs or feet.  You feel burning or tingling in your legs or feet.  You have pain or cramps in your legs and feet.  Your legs or feet are numb.  Your feet always feel cold. SEEK  IMMEDIATE MEDICAL CARE IF:   There is increasing redness, swelling, or pain in or around a wound.  There is a red line that goes up your leg.  Pus is coming from a wound.  You develop a fever or as directed by your health care provider.  You notice a bad smell coming from an ulcer or wound. Document Released: 05/08/2000 Document Revised: 01/11/2013 Document Reviewed: 10/18/2012 [facility] Patient Information 2014 [place].

## 2013-07-06 DIAGNOSIS — R195 Other fecal abnormalities: Secondary | ICD-10-CM | POA: Diagnosis not present

## 2013-07-06 DIAGNOSIS — H6120 Impacted cerumen, unspecified ear: Secondary | ICD-10-CM | POA: Diagnosis not present

## 2013-07-14 ENCOUNTER — Other Ambulatory Visit (HOSPITAL_BASED_OUTPATIENT_CLINIC_OR_DEPARTMENT_OTHER): Payer: Medicare Other

## 2013-07-14 DIAGNOSIS — D472 Monoclonal gammopathy: Secondary | ICD-10-CM

## 2013-07-14 LAB — COMPREHENSIVE METABOLIC PANEL (CC13)
ALBUMIN: 4 g/dL (ref 3.5–5.0)
ALK PHOS: 98 U/L (ref 40–150)
ALT: 19 U/L (ref 0–55)
AST: 25 U/L (ref 5–34)
Anion Gap: 9 mEq/L (ref 3–11)
BUN: 15.7 mg/dL (ref 7.0–26.0)
CO2: 28 mEq/L (ref 22–29)
CREATININE: 0.8 mg/dL (ref 0.6–1.1)
Calcium: 10 mg/dL (ref 8.4–10.4)
Chloride: 107 mEq/L (ref 98–109)
Glucose: 94 mg/dl (ref 70–140)
POTASSIUM: 3.9 meq/L (ref 3.5–5.1)
Sodium: 144 mEq/L (ref 136–145)
Total Bilirubin: 0.57 mg/dL (ref 0.20–1.20)
Total Protein: 7.4 g/dL (ref 6.4–8.3)

## 2013-07-14 LAB — CBC WITH DIFFERENTIAL/PLATELET
BASO%: 1.3 % (ref 0.0–2.0)
Basophils Absolute: 0.1 10*3/uL (ref 0.0–0.1)
EOS%: 2.1 % (ref 0.0–7.0)
Eosinophils Absolute: 0.1 10*3/uL (ref 0.0–0.5)
HCT: 36.4 % (ref 34.8–46.6)
HGB: 11.7 g/dL (ref 11.6–15.9)
LYMPH%: 12.6 % — ABNORMAL LOW (ref 14.0–49.7)
MCH: 27.1 pg (ref 25.1–34.0)
MCHC: 32.2 g/dL (ref 31.5–36.0)
MCV: 84.2 fL (ref 79.5–101.0)
MONO#: 0.4 10*3/uL (ref 0.1–0.9)
MONO%: 7.8 % (ref 0.0–14.0)
NEUT#: 4.3 10*3/uL (ref 1.5–6.5)
NEUT%: 76.2 % (ref 38.4–76.8)
Platelets: 278 10*3/uL (ref 145–400)
RBC: 4.32 10*6/uL (ref 3.70–5.45)
RDW: 17.7 % — AB (ref 11.2–14.5)
WBC: 5.7 10*3/uL (ref 3.9–10.3)
lymph#: 0.7 10*3/uL — ABNORMAL LOW (ref 0.9–3.3)

## 2013-07-14 LAB — LACTATE DEHYDROGENASE (CC13): LDH: 255 U/L — ABNORMAL HIGH (ref 125–245)

## 2013-07-18 LAB — IGG, IGA, IGM
IGG (IMMUNOGLOBIN G), SERUM: 812 mg/dL (ref 690–1700)
IgA: 931 mg/dL — ABNORMAL HIGH (ref 69–380)
IgM, Serum: 55 mg/dL (ref 52–322)

## 2013-07-18 LAB — KAPPA/LAMBDA LIGHT CHAINS
KAPPA FREE LGHT CHN: 4.52 mg/dL — AB (ref 0.33–1.94)
KAPPA LAMBDA RATIO: 2.46 — AB (ref 0.26–1.65)
Lambda Free Lght Chn: 1.84 mg/dL (ref 0.57–2.63)

## 2013-07-18 LAB — BETA 2 MICROGLOBULIN, SERUM: BETA 2 MICROGLOBULIN: 1.94 mg/L — AB (ref 1.01–1.73)

## 2013-07-21 ENCOUNTER — Other Ambulatory Visit: Payer: Medicare Other | Admitting: Lab

## 2013-07-21 ENCOUNTER — Encounter: Payer: Self-pay | Admitting: Physician Assistant

## 2013-07-21 ENCOUNTER — Telehealth: Payer: Self-pay | Admitting: *Deleted

## 2013-07-21 ENCOUNTER — Ambulatory Visit (HOSPITAL_BASED_OUTPATIENT_CLINIC_OR_DEPARTMENT_OTHER): Payer: Medicare Other | Admitting: Physician Assistant

## 2013-07-21 VITALS — BP 147/94 | HR 61 | Temp 97.4°F | Resp 18 | Ht 64.0 in | Wt 217.4 lb

## 2013-07-21 DIAGNOSIS — D472 Monoclonal gammopathy: Secondary | ICD-10-CM | POA: Diagnosis not present

## 2013-07-21 NOTE — Progress Notes (Signed)
[place] [phone]   Fax:(336) [place], [place] [address], [address] [place] [address]  PRINCIPAL DIAGNOSIS: Monoclonal gammopathy of undetermined significance (MGUS).    CURRENT THERAPY: Observation.   INTERVAL HISTORY:  Julie [name] 80 y.o. female returns to the clinic today for routine four-month followup visit. The patient is feeling fine today with no specific complaints except for arthralgia and generalized pain. She is currently under the care of Dr. Ouida Sills for treatment of rheumatoid arthritis and fibromyalgia. She is treated with methotrexate, prednisone and Vicodin. He is also treating her for her problem with her right great toe. She denied having any significant chest pain, shortness breath, cough or hemoptysis. She denied having any significant weight loss or night sweats. No bleeding issues. The patient has repeat CBC, comprehensive metabolic panel, LDH and myeloma panel performed recently and she is here for evaluation and discussion of her lab results.   MEDICAL HISTORY: Past Medical History  Diagnosis Date  . Arthritis   . Blood transfusion   . Cataract   . Heart murmur   . Hyperlipidemia   . Hypertension   . Interstitial cystitis   . Ulcer 1970    duodenal  . GERD (gastroesophageal reflux disease)     ALLERGIES:  is allergic to nitrofurantoin; nitrofurantoin monohyd macro; oxybutynin chloride; and sanctura.  MEDICATIONS:  Current Outpatient Prescriptions  Medication Sig Dispense Refill  . amLODipine-benazepril (LOTREL) 10-20 MG per capsule Take 1 capsule by mouth every morning.       . calcium-vitamin D (OSCAL WITH D) 500-200 MG-UNIT per tablet Take 1 tablet by mouth every morning.      [name] esomeprazole (NEXIUM) 40 MG capsule Take 40 mg by mouth daily before breakfast.        . fish oil-omega-3 fatty acids 1000 MG capsule Take 1 g by mouth every morning.       . folic acid  (FOLVITE) 1 MG tablet Take 1 mg by mouth every morning.       [name] HYDROcodone-acetaminophen (NORCO/VICODIN) 5-325 MG per tablet Take 1 tablet by mouth 2 (two) times daily as needed. For pain      . losartan (COZAAR) 100 MG tablet Take 100 mg by mouth daily.      . meloxicam (MOBIC) 15 MG tablet Take 15 mg by mouth at bedtime.      . Methotrexate, Anti-Rheumatic, (METHOTREXATE, PF, [place]) Inject 0.4 mLs into the skin once a week. On friday      . metoprolol succinate (TOPROL-XL) 50 MG 24 hr tablet Take 50 mg by mouth at bedtime. Take with or immediately following a meal.      . Multiple Vitamins-Minerals (MULTIVITAMIN WITH MINERALS) tablet Take 1 tablet by mouth every morning.       . predniSONE (DELTASONE) 5 MG tablet Take 5 mg by mouth daily with breakfast.      . pregabalin (LYRICA) 75 MG capsule Take 75 mg by mouth 2 (two) times daily.      . colchicine 0.6 MG tablet Take 0.6 mg by mouth 2 (two) times daily.      . diazepam (VALIUM) 5 MG tablet Take 5 mg by mouth 2 (two) times daily as needed. Muscle spasm      . lidocaine (LIDODERM) 5 % Place 1 patch onto the skin daily. Remove & Discard patch within 12 hours or as directed by MD      . metFORMIN (  GLUCOPHAGE) 500 MG tablet Take 500 mg by mouth daily with supper.       No current facility-administered medications for this visit.    SURGICAL HISTORY:  Past Surgical History  Procedure Laterality Date  . Upper gastrointestinal endoscopy    . Colonoscopy    . Vaginal hysterectomy    . Bilateral oophorectomy      REVIEW OF SYSTEMS:  A comprehensive review of systems was negative except for: Constitutional: positive for fatigue Musculoskeletal: positive for arthralgias and myalgias   PHYSICAL EXAMINATION: General appearance: alert, cooperative, fatigued and no distress Head: Normocephalic, without obvious abnormality, atraumatic Neck: no adenopathy, no JVD, supple, symmetrical, trachea midline and thyroid not enlarged, symmetric, no  tenderness/mass/nodules Lymph nodes: Cervical, supraclavicular, and axillary nodes normal. Resp: clear to auscultation bilaterally Back: symmetric, no curvature. ROM normal. No CVA tenderness. Cardio: regular rate and rhythm, S1, S2 normal, no murmur, click, rub or gallop GI: soft, non-tender; bowel sounds normal; no masses,  no organomegaly Extremities: extremities normal, atraumatic, no cyanosis or edema  ECOG PERFORMANCE STATUS: 1 - Symptomatic but completely ambulatory  Blood pressure 147/94, pulse 61, temperature 97.4 F (36.3 C), temperature source Oral, resp. rate 18, height 5\' 4"  (1.626 m), weight 217 lb 6.4 oz (98.612 kg), SpO2 100.00%.  LABORATORY DATA: Lab Results  Component Value Date   WBC 5.7 07/14/2013   HGB 11.7 07/14/2013   HCT 36.4 07/14/2013   MCV 84.2 07/14/2013   PLT 278 07/14/2013      Chemistry      Component Value Date/Time   NA 144 07/14/2013 1055   NA 141 06/12/2012 0912   K 3.9 07/14/2013 1055   K 3.8 06/12/2012 0912   CL 104 06/12/2012 0912   CL 108* 02/10/2012 0902   CO2 28 07/14/2013 1055   CO2 28 06/12/2012 0912   BUN 15.7 07/14/2013 1055   BUN 18 06/12/2012 0912   CREATININE 0.8 07/14/2013 1055   CREATININE 0.71 06/12/2012 0912      Component Value Date/Time   CALCIUM 10.0 07/14/2013 1055   CALCIUM 9.2 06/12/2012 0912   ALKPHOS 98 07/14/2013 1055   ALKPHOS 107 08/14/2011 0954   AST 25 07/14/2013 1055   AST 17 08/14/2011 0954   ALT 19 07/14/2013 1055   ALT 12 08/14/2011 0954   BILITOT 0.57 07/14/2013 1055   BILITOT 0.4 08/14/2011 0954     Protein studies from 07/14/2013 revealed beta-2 microglobulin 1.94, By free light chain a 4.52 with a kappa lambda ratio of 2.46, the slight increase in the IgA at 931  RADIOGRAPHIC STUDIES: No results found.  ASSESSMENT AND PLAN:   ASSESSMENT: This is a very pleasant 80 years old [place] American female with history of monoclonal gammopathy of unknown significance currently on observation. The patient has no evidence  for disease progression based on the recent blood work, except for slightly elevated IgA and beta-2 microglobulin.   PLAN: I discussed the lab result with the patient. I recommended for her to continue observation for now with repeat CBC, comprehensive metabolic panel, LDH and myeloma panel in 3 months. She'll followup with Dr. Julien Nordmann in 3 months to discuss the results of the repeat laboratory studies. Should she continue to show increases in her protein studies she may need to start on some sort of treatment. Patient is aware of this and is agreeable to the plan. She was advised to call me immediately if she has any concerning symptoms in the interval.  All questions were answered.  The patient knows to call the clinic with any problems, questions or concerns. We can certainly see the patient much sooner if necessary. The patient voices understanding of current disease status and treatment options and is in agreement with the current care plan.  All questions were answered. The patient knows to call the clinic with any problems, questions or concerns. We can certainly see the patient much sooner if necessary.  Wynetta Emery, General [place] [facility], PA-C

## 2013-07-21 NOTE — Telephone Encounter (Signed)
appts made and printed...td 

## 2013-07-21 NOTE — Telephone Encounter (Signed)
Pt states she needs to reschedule her appt with Dr Amalia Hailey.

## 2013-07-23 NOTE — Patient Instructions (Signed)
Your protein studies are relatively stable with only a slight increase. He'll continue on observation and followup in 3 months with repeat protein studies

## 2013-07-24 NOTE — Telephone Encounter (Signed)
SCHEDULED PT FOR 07/26/13 @ 9:15AM

## 2013-07-26 ENCOUNTER — Encounter: Payer: Self-pay | Admitting: Podiatry

## 2013-07-26 ENCOUNTER — Ambulatory Visit (INDEPENDENT_AMBULATORY_CARE_PROVIDER_SITE_OTHER): Payer: Medicare Other | Admitting: Podiatry

## 2013-07-26 VITALS — BP 132/86 | HR 69 | Resp 12

## 2013-07-26 DIAGNOSIS — B351 Tinea unguium: Secondary | ICD-10-CM

## 2013-07-26 DIAGNOSIS — L600 Ingrowing nail: Secondary | ICD-10-CM

## 2013-07-26 DIAGNOSIS — M79609 Pain in unspecified limb: Secondary | ICD-10-CM

## 2013-07-26 NOTE — Patient Instructions (Signed)
I will need to get medical clearance from Dr.Pang  for nail surgery under local.

## 2013-07-27 NOTE — Progress Notes (Signed)
Patient ID: Julie [name], female   DOB: [birth year], 80 y.o.   MRN: [id number]  Subjective: Patient presents again after the visit of 06/21/2013 still complaining of pain along the medial margin of the right hallux nail. On the initial visit the margin was debrided back, however, patient has had minimal relief. She says that she is having a hard time tolerating shoe because of discomfort in the area  Objective: Incurvated medial margin of the right hallux toenail with a callus nail groove noted. There is no erythema, edema or active drainage noted.  Assessment: Ingrowing medial margin of the right hallux toenail Vascular status is undetermined because of diminished posterior tibial pulses. Monoclonal gammopathy  Plan: Attempted to debride the medial margin of the right hallux nail again, however, the nail is incurvated and minimal relief was obtained. No bleeding noted.   Obtain  medical clearance from  Drs. Mohamed and Dr.Pang prior to any surgical intervention. Also we'll need vascular exam prior to an elective procedure. Send copy of note to Dr. Earlie Server and Dr. Minna Antis for medical clearance.  I will send a copy of this note to Drs. Mohamed and Dr. Minna Antis for possible phenol matricectomy under local in the office. (Is there a medical contraindication to this procedure?)

## 2013-07-28 ENCOUNTER — Other Ambulatory Visit: Payer: Self-pay

## 2013-07-28 DIAGNOSIS — Z1231 Encounter for screening mammogram for malignant neoplasm of breast: Secondary | ICD-10-CM

## 2013-08-02 DIAGNOSIS — M79609 Pain in unspecified limb: Secondary | ICD-10-CM | POA: Diagnosis not present

## 2013-08-02 DIAGNOSIS — M069 Rheumatoid arthritis, unspecified: Secondary | ICD-10-CM | POA: Diagnosis not present

## 2013-08-04 ENCOUNTER — Telehealth: Payer: Self-pay | Admitting: *Deleted

## 2013-08-04 NOTE — Telephone Encounter (Signed)
Pt asked if we had medical clearance from Dr. Minna Antis to fix her toenail.  I told her Dr. Amalia Hailey had written medical clearance request to Drs. Minna Antis and [place].  We would call as soon as they were received.

## 2013-08-10 ENCOUNTER — Encounter: Payer: Self-pay | Admitting: *Deleted

## 2013-08-10 ENCOUNTER — Telehealth: Payer: Self-pay | Admitting: *Deleted

## 2013-08-10 DIAGNOSIS — R0989 Other specified symptoms and signs involving the circulatory and respiratory systems: Secondary | ICD-10-CM

## 2013-08-10 NOTE — Telephone Encounter (Signed)
Calling regarding a discrepancy with a consultation.  I returned her call.  My foot is killing me.  I still haven't heard anything from you all about clearance.  Dr. Wilmon Pali office said they haven't received a letter from you all.  What is the hold up?  If Dr. Amalia Hailey doesn't want to treat me maybe he can refer me to somewhere else.  I told her that's not the case at all, we will followup and get the letter faxed as soon as possible.  We will call as soon as we hear from Dr. Wilmon Pali office.

## 2013-08-11 ENCOUNTER — Telehealth: Payer: Self-pay | Admitting: Medical Oncology

## 2013-08-11 NOTE — Telephone Encounter (Signed)
Medical clearance requested. I sent note to Dr Amalia Hailey that pt under observation and to please contact PCP. I faxed last office note.

## 2013-08-15 ENCOUNTER — Telehealth (HOSPITAL_COMMUNITY): Payer: Self-pay | Admitting: *Deleted

## 2013-08-17 ENCOUNTER — Ambulatory Visit (HOSPITAL_COMMUNITY)
Admission: RE | Admit: 2013-08-17 | Discharge: 2013-08-17 | Disposition: A | Discharge status: Home / Self Care | Payer: Medicare Other | Source: Ambulatory Visit | Attending: Podiatry | Admitting: Podiatry

## 2013-08-17 DIAGNOSIS — R0989 Other specified symptoms and signs involving the circulatory and respiratory systems: Secondary | ICD-10-CM | POA: Insufficient documentation

## 2013-08-17 NOTE — Progress Notes (Signed)
Lower Extremity Arterial Duplex Completed. No evidence for arterial insufficiency. °Brianna L Mazza,RVT °

## 2013-08-23 ENCOUNTER — Encounter: Payer: Self-pay | Admitting: Podiatry

## 2013-08-23 NOTE — Telephone Encounter (Signed)
Message copied by Lolita Rieger on Wed Aug 23, 2013 10:30 AM ------      Message from: Gean Birchwood      Created: Wed Aug 23, 2013  8:11 AM       Results of lower arterial examination dated 08/17/2013: This is a normal lower extremity arterial duplex Doppler            Contact patient to schedule nail surgery.      Have medical clearance from Dr. Minna Antis via Fax ------

## 2013-08-23 NOTE — Telephone Encounter (Signed)
Per Dr. Amalia Hailey, I left the patient a message to call and schedule an appointment.  We received medical clearance to perform the procedure.

## 2013-09-04 ENCOUNTER — Ambulatory Visit
Admission: RE | Admit: 2013-09-04 | Discharge: 2013-09-04 | Disposition: A | Discharge status: Home / Self Care | Payer: Medicare Other | Source: Ambulatory Visit

## 2013-09-04 DIAGNOSIS — Z1231 Encounter for screening mammogram for malignant neoplasm of breast: Secondary | ICD-10-CM

## 2013-09-06 DIAGNOSIS — M129 Arthropathy, unspecified: Secondary | ICD-10-CM | POA: Diagnosis not present

## 2013-10-11 ENCOUNTER — Other Ambulatory Visit (HOSPITAL_BASED_OUTPATIENT_CLINIC_OR_DEPARTMENT_OTHER): Payer: Medicare Other

## 2013-10-11 DIAGNOSIS — D472 Monoclonal gammopathy: Secondary | ICD-10-CM | POA: Diagnosis not present

## 2013-10-11 LAB — CBC WITH DIFFERENTIAL/PLATELET
BASO%: 1.4 % (ref 0.0–2.0)
Basophils Absolute: 0.1 10*3/uL (ref 0.0–0.1)
EOS%: 3.2 % (ref 0.0–7.0)
Eosinophils Absolute: 0.2 10*3/uL (ref 0.0–0.5)
HCT: 36.7 % (ref 34.8–46.6)
HGB: 11.8 g/dL (ref 11.6–15.9)
LYMPH%: 20.4 % (ref 14.0–49.7)
MCH: 28 pg (ref 25.1–34.0)
MCHC: 32.2 g/dL (ref 31.5–36.0)
MCV: 87 fL (ref 79.5–101.0)
MONO#: 0.5 10*3/uL (ref 0.1–0.9)
MONO%: 9.6 % (ref 0.0–14.0)
NEUT#: 3.4 10*3/uL (ref 1.5–6.5)
NEUT%: 65.4 % (ref 38.4–76.8)
PLATELETS: 256 10*3/uL (ref 145–400)
RBC: 4.21 10*6/uL (ref 3.70–5.45)
RDW: 15.8 % — AB (ref 11.2–14.5)
WBC: 5.3 10*3/uL (ref 3.9–10.3)
lymph#: 1.1 10*3/uL (ref 0.9–3.3)

## 2013-10-11 LAB — COMPREHENSIVE METABOLIC PANEL (CC13)
ALT: 18 U/L (ref 0–55)
AST: 25 U/L (ref 5–34)
Albumin: 3.6 g/dL (ref 3.5–5.0)
Alkaline Phosphatase: 90 U/L (ref 40–150)
Anion Gap: 12 mEq/L — ABNORMAL HIGH (ref 3–11)
BILIRUBIN TOTAL: 0.49 mg/dL (ref 0.20–1.20)
BUN: 18.2 mg/dL (ref 7.0–26.0)
CO2: 23 mEq/L (ref 22–29)
CREATININE: 0.8 mg/dL (ref 0.6–1.1)
Calcium: 8.9 mg/dL (ref 8.4–10.4)
Chloride: 111 mEq/L — ABNORMAL HIGH (ref 98–109)
GLUCOSE: 111 mg/dL (ref 70–140)
Potassium: 4 mEq/L (ref 3.5–5.1)
Sodium: 146 mEq/L — ABNORMAL HIGH (ref 136–145)
Total Protein: 6.9 g/dL (ref 6.4–8.3)

## 2013-10-11 LAB — LACTATE DEHYDROGENASE (CC13): LDH: 244 U/L (ref 125–245)

## 2013-10-17 LAB — IGG, IGA, IGM
IGA: 821 mg/dL — AB (ref 69–380)
IGG (IMMUNOGLOBIN G), SERUM: 752 mg/dL (ref 690–1700)
IgM, Serum: 46 mg/dL — ABNORMAL LOW (ref 52–322)

## 2013-10-17 LAB — KAPPA/LAMBDA LIGHT CHAINS
Kappa free light chain: 5.08 mg/dL — ABNORMAL HIGH (ref 0.33–1.94)
Kappa:Lambda Ratio: 3.02 — ABNORMAL HIGH (ref 0.26–1.65)
Lambda Free Lght Chn: 1.68 mg/dL (ref 0.57–2.63)

## 2013-10-17 LAB — BETA 2 MICROGLOBULIN, SERUM: BETA 2 MICROGLOBULIN: 2.38 mg/L (ref <=2.51)

## 2013-10-18 ENCOUNTER — Ambulatory Visit (HOSPITAL_BASED_OUTPATIENT_CLINIC_OR_DEPARTMENT_OTHER): Payer: Medicare Other | Admitting: Internal Medicine

## 2013-10-18 ENCOUNTER — Encounter: Payer: Self-pay | Admitting: Internal Medicine

## 2013-10-18 ENCOUNTER — Telehealth: Payer: Self-pay | Admitting: Internal Medicine

## 2013-10-18 VITALS — BP 149/85 | HR 80 | Temp 98.8°F | Resp 18 | Ht 64.0 in | Wt 221.9 lb

## 2013-10-18 DIAGNOSIS — D472 Monoclonal gammopathy: Secondary | ICD-10-CM

## 2013-10-18 DIAGNOSIS — D649 Anemia, unspecified: Secondary | ICD-10-CM | POA: Diagnosis not present

## 2013-10-18 NOTE — Progress Notes (Signed)
[facility] [phone]   Fax:(336) [place], MD [address] [place] [address]  PRINCIPAL DIAGNOSIS: Monoclonal gammopathy of undetermined significance (MGUS).    CURRENT THERAPY: Observation.   INTERVAL HISTORY:  Julie [name] 80 y.o. female returns to the clinic today for routine six-month followup visit. The patient is feeling fine today with no specific complaints except for arthralgia. She is still under the care of Dr. Ouida Sills for treatment of rheumatoid arthritis and fibromyalgia, and treated with methotrexate and Vicodin. She denied having any significant chest pain, shortness of breath, cough or hemoptysis. She denied having any significant weight loss or night sweats. No bleeding issues. The patient has repeat CBC, comprehensive metabolic panel, LDH and myeloma panel performed recently and she is here for evaluation and discussion of her lab results.   MEDICAL HISTORY: Past Medical History  Diagnosis Date  . Arthritis   . Blood transfusion   . Cataract   . Heart murmur   . Hyperlipidemia   . Hypertension   . Interstitial cystitis   . Ulcer 1970    duodenal  . GERD (gastroesophageal reflux disease)     ALLERGIES:  is allergic to nitrofurantoin; nitrofurantoin monohyd macro; oxybutynin chloride; and sanctura.  MEDICATIONS:  Current Outpatient Prescriptions  Medication Sig Dispense Refill  . amLODipine-benazepril (LOTREL) 10-20 MG per capsule Take 1 capsule by mouth every morning.       . calcium-vitamin D (OSCAL WITH D) 500-200 MG-UNIT per tablet Take 1 tablet by mouth every morning.      . colchicine 0.6 MG tablet Take 0.6 mg by mouth 2 (two) times daily.      . diazepam (VALIUM) 5 MG tablet Take 5 mg by mouth 2 (two) times daily as needed. Muscle spasm      . esomeprazole (NEXIUM) 40 MG capsule Take 40 mg by mouth daily before breakfast.        . fish oil-omega-3 fatty  acids 1000 MG capsule Take 1 g by mouth every morning.       . folic acid (FOLVITE) 1 MG tablet Take 1 mg by mouth every morning.       [name] HYDROcodone-acetaminophen (NORCO/VICODIN) 5-325 MG per tablet Take 1 tablet by mouth 2 (two) times daily as needed. For pain      . lidocaine (LIDODERM) 5 % Place 1 patch onto the skin daily. Remove & Discard patch within 12 hours or as directed by MD      . losartan (COZAAR) 100 MG tablet Take 100 mg by mouth daily.      . meloxicam (MOBIC) 15 MG tablet Take 15 mg by mouth at bedtime.      . metFORMIN (GLUCOPHAGE) 500 MG tablet Take 500 mg by mouth daily with supper.      . Methotrexate, Anti-Rheumatic, (METHOTREXATE, PF, [place]) Inject 0.4 mLs into the skin once a week. On friday      . metoprolol succinate (TOPROL-XL) 50 MG 24 hr tablet Take 50 mg by mouth at bedtime. Take with or immediately following a meal.      . Multiple Vitamins-Minerals (MULTIVITAMIN WITH MINERALS) tablet Take 1 tablet by mouth every morning.       . predniSONE (DELTASONE) 5 MG tablet Take 5 mg by mouth daily with breakfast.      . pregabalin (LYRICA) 75 MG capsule Take 75 mg by mouth 2 (two) times daily.  No current facility-administered medications for this visit.    SURGICAL HISTORY:  Past Surgical History  Procedure Laterality Date  . Upper gastrointestinal endoscopy    . Colonoscopy    . Vaginal hysterectomy    . Bilateral oophorectomy      REVIEW OF SYSTEMS:  A comprehensive review of systems was negative except for: Constitutional: positive for fatigue Musculoskeletal: positive for arthralgias and myalgias   PHYSICAL EXAMINATION: General appearance: alert, cooperative, fatigued and no distress Head: Normocephalic, without obvious abnormality, atraumatic Neck: no adenopathy, no JVD, supple, symmetrical, trachea midline and thyroid not enlarged, symmetric, no tenderness/mass/nodules Lymph nodes: Cervical, supraclavicular, and axillary nodes normal. Resp: clear to  auscultation bilaterally Back: symmetric, no curvature. ROM normal. No CVA tenderness. Cardio: regular rate and rhythm, S1, S2 normal, no murmur, click, rub or gallop GI: soft, non-tender; bowel sounds normal; no masses,  no organomegaly Extremities: extremities normal, atraumatic, no cyanosis or edema  ECOG PERFORMANCE STATUS: 1 - Symptomatic but completely ambulatory  There were no vitals taken for this visit.  LABORATORY DATA: Lab Results  Component Value Date   WBC 5.3 10/11/2013   HGB 11.8 10/11/2013   HCT 36.7 10/11/2013   MCV 87.0 10/11/2013   PLT 256 10/11/2013      Chemistry      Component Value Date/Time   NA 146* 10/11/2013 0804   NA 141 06/12/2012 0912   K 4.0 10/11/2013 0804   K 3.8 06/12/2012 0912   CL 104 06/12/2012 0912   CL 108* 02/10/2012 0902   CO2 23 10/11/2013 0804   CO2 28 06/12/2012 0912   BUN 18.2 10/11/2013 0804   BUN 18 06/12/2012 0912   CREATININE 0.8 10/11/2013 0804   CREATININE 0.71 06/12/2012 0912      Component Value Date/Time   CALCIUM 8.9 10/11/2013 0804   CALCIUM 9.2 06/12/2012 0912   ALKPHOS 90 10/11/2013 0804   ALKPHOS 107 08/14/2011 0954   AST 25 10/11/2013 0804   AST 17 08/14/2011 0954   ALT 18 10/11/2013 0804   ALT 12 08/14/2011 0954   BILITOT 0.49 10/11/2013 0804   BILITOT 0.4 08/14/2011 0954     Other lab results: Beta-2 microglobulin 2.38, IgG 752, IgA 821 and IgM 46. Free kappa light chain 5.08, free lambda light chain 1.68 with a kappa/lambda ratio 3.02  RADIOGRAPHIC STUDIES: No results found.  ASSESSMENT AND PLAN: This is a very pleasant 80 years old [place] American female with history of monoclonal gammopathy of unknown significance currently on observation. The patient has no evidence for disease progression based on the recent blood work, except for slightly elevated free kappa light chain and mild anemia.  I discussed the lab result with the patient. I recommended for her to continue observation for now with repeat CBC, comprehensive  metabolic panel, LDH and myeloma panel in 6 months. She would come back for followup visit at that time. She was advised to call me immediately if she has any concerning symptoms in the interval.  All questions were answered. The patient knows to call the clinic with any problems, questions or concerns. We can certainly see the patient much sooner if necessary. The patient voices understanding of current disease status and treatment options and is in agreement with the current care plan.  All questions were answered. The patient knows to call the clinic with any problems, questions or concerns. We can certainly see the patient much sooner if necessary.  Disclaimer: This note was dictated with voice recognition software.  Similar sounding words can inadvertently be transcribed and may not be corrected upon review.

## 2013-10-18 NOTE — Telephone Encounter (Signed)
gv adn printed appt sched and avs for pt for NOV and DEC.... °

## 2013-11-30 DIAGNOSIS — E119 Type 2 diabetes mellitus without complications: Secondary | ICD-10-CM | POA: Diagnosis not present

## 2013-11-30 DIAGNOSIS — R350 Frequency of micturition: Secondary | ICD-10-CM | POA: Diagnosis not present

## 2013-11-30 DIAGNOSIS — I10 Essential (primary) hypertension: Secondary | ICD-10-CM | POA: Diagnosis not present

## 2013-11-30 DIAGNOSIS — R7309 Other abnormal glucose: Secondary | ICD-10-CM | POA: Diagnosis not present

## 2013-12-06 DIAGNOSIS — M069 Rheumatoid arthritis, unspecified: Secondary | ICD-10-CM | POA: Diagnosis not present

## 2013-12-28 DIAGNOSIS — I10 Essential (primary) hypertension: Secondary | ICD-10-CM | POA: Diagnosis not present

## 2013-12-28 DIAGNOSIS — F411 Generalized anxiety disorder: Secondary | ICD-10-CM | POA: Diagnosis not present

## 2014-01-22 DIAGNOSIS — E119 Type 2 diabetes mellitus without complications: Secondary | ICD-10-CM | POA: Diagnosis not present

## 2014-01-22 DIAGNOSIS — H25019 Cortical age-related cataract, unspecified eye: Secondary | ICD-10-CM | POA: Diagnosis not present

## 2014-01-22 DIAGNOSIS — H2510 Age-related nuclear cataract, unspecified eye: Secondary | ICD-10-CM | POA: Diagnosis not present

## 2014-01-25 DIAGNOSIS — E119 Type 2 diabetes mellitus without complications: Secondary | ICD-10-CM | POA: Diagnosis not present

## 2014-01-25 DIAGNOSIS — R195 Other fecal abnormalities: Secondary | ICD-10-CM | POA: Diagnosis not present

## 2014-01-25 DIAGNOSIS — I10 Essential (primary) hypertension: Secondary | ICD-10-CM | POA: Diagnosis not present

## 2014-01-25 DIAGNOSIS — K649 Unspecified hemorrhoids: Secondary | ICD-10-CM | POA: Diagnosis not present

## 2014-02-08 DIAGNOSIS — M069 Rheumatoid arthritis, unspecified: Secondary | ICD-10-CM | POA: Diagnosis not present

## 2014-02-23 DIAGNOSIS — M19072 Primary osteoarthritis, left ankle and foot: Secondary | ICD-10-CM | POA: Diagnosis not present

## 2014-03-01 DIAGNOSIS — I119 Hypertensive heart disease without heart failure: Secondary | ICD-10-CM | POA: Diagnosis not present

## 2014-03-01 DIAGNOSIS — E119 Type 2 diabetes mellitus without complications: Secondary | ICD-10-CM | POA: Diagnosis not present

## 2014-03-16 DIAGNOSIS — Z23 Encounter for immunization: Secondary | ICD-10-CM | POA: Diagnosis not present

## 2014-03-21 DIAGNOSIS — I119 Hypertensive heart disease without heart failure: Secondary | ICD-10-CM | POA: Diagnosis not present

## 2014-04-10 DIAGNOSIS — M0579 Rheumatoid arthritis with rheumatoid factor of multiple sites without organ or systems involvement: Secondary | ICD-10-CM | POA: Diagnosis not present

## 2014-04-18 ENCOUNTER — Other Ambulatory Visit (HOSPITAL_BASED_OUTPATIENT_CLINIC_OR_DEPARTMENT_OTHER): Payer: Medicare Other

## 2014-04-18 DIAGNOSIS — D472 Monoclonal gammopathy: Secondary | ICD-10-CM | POA: Diagnosis not present

## 2014-04-18 LAB — CBC WITH DIFFERENTIAL/PLATELET
BASO%: 1.2 % (ref 0.0–2.0)
Basophils Absolute: 0.1 10e3/uL (ref 0.0–0.1)
EOS%: 2.9 % (ref 0.0–7.0)
Eosinophils Absolute: 0.2 10e3/uL (ref 0.0–0.5)
HCT: 37 % (ref 34.8–46.6)
HGB: 11.8 g/dL (ref 11.6–15.9)
LYMPH%: 16.6 % (ref 14.0–49.7)
MCH: 28.1 pg (ref 25.1–34.0)
MCHC: 31.9 g/dL (ref 31.5–36.0)
MCV: 87.9 fL (ref 79.5–101.0)
MONO#: 0.6 10e3/uL (ref 0.1–0.9)
MONO%: 8.3 % (ref 0.0–14.0)
NEUT#: 4.8 10e3/uL (ref 1.5–6.5)
NEUT%: 71 % (ref 38.4–76.8)
Platelets: 243 10e3/uL (ref 145–400)
RBC: 4.2 10e6/uL (ref 3.70–5.45)
RDW: 14.7 % — ABNORMAL HIGH (ref 11.2–14.5)
WBC: 6.8 10e3/uL (ref 3.9–10.3)
lymph#: 1.1 10e3/uL (ref 0.9–3.3)
nRBC: 0 % (ref 0–0)

## 2014-04-18 LAB — COMPREHENSIVE METABOLIC PANEL (CC13)
ALBUMIN: 3.6 g/dL (ref 3.5–5.0)
ALK PHOS: 79 U/L (ref 40–150)
ALT: 16 U/L (ref 0–55)
AST: 21 U/L (ref 5–34)
Anion Gap: 9 mEq/L (ref 3–11)
BUN: 28.3 mg/dL — AB (ref 7.0–26.0)
CALCIUM: 9.1 mg/dL (ref 8.4–10.4)
CO2: 25 meq/L (ref 22–29)
Chloride: 110 mEq/L — ABNORMAL HIGH (ref 98–109)
Creatinine: 1.1 mg/dL (ref 0.6–1.1)
Glucose: 72 mg/dl (ref 70–140)
POTASSIUM: 3.6 meq/L (ref 3.5–5.1)
Sodium: 144 mEq/L (ref 136–145)
Total Bilirubin: 0.62 mg/dL (ref 0.20–1.20)
Total Protein: 6.8 g/dL (ref 6.4–8.3)

## 2014-04-18 LAB — LACTATE DEHYDROGENASE (CC13): LDH: 242 U/L (ref 125–245)

## 2014-04-23 LAB — IGG, IGA, IGM
IgA: 696 mg/dL — ABNORMAL HIGH (ref 69–380)
IgG (Immunoglobin G), Serum: 725 mg/dL (ref 690–1700)
IgM, Serum: 46 mg/dL — ABNORMAL LOW (ref 52–322)

## 2014-04-23 LAB — KAPPA/LAMBDA LIGHT CHAINS
Kappa free light chain: 5.7 mg/dL — ABNORMAL HIGH (ref 0.33–1.94)
Kappa:Lambda Ratio: 2.71 — ABNORMAL HIGH (ref 0.26–1.65)
Lambda Free Lght Chn: 2.1 mg/dL (ref 0.57–2.63)

## 2014-04-23 LAB — BETA 2 MICROGLOBULIN, SERUM: Beta-2 Microglobulin: 2.77 mg/L — ABNORMAL HIGH (ref <=2.51)

## 2014-04-25 ENCOUNTER — Encounter: Payer: Self-pay | Admitting: Internal Medicine

## 2014-04-25 ENCOUNTER — Ambulatory Visit (HOSPITAL_BASED_OUTPATIENT_CLINIC_OR_DEPARTMENT_OTHER): Payer: Medicare Other | Admitting: Internal Medicine

## 2014-04-25 ENCOUNTER — Telehealth: Payer: Self-pay | Admitting: Internal Medicine

## 2014-04-25 VITALS — BP 139/74 | HR 60 | Temp 97.8°F | Resp 18 | Ht 64.0 in | Wt 232.0 lb

## 2014-04-25 DIAGNOSIS — D472 Monoclonal gammopathy: Secondary | ICD-10-CM | POA: Diagnosis not present

## 2014-04-25 NOTE — Telephone Encounter (Signed)
Gave avs & cal for May/June 2016.

## 2014-04-25 NOTE — Progress Notes (Signed)
[facility] Telephone:([phone]   Fax:(336) [place], MD [address] [place] [address]  PRINCIPAL DIAGNOSIS: Monoclonal gammopathy of undetermined significance (MGUS).    CURRENT THERAPY: Observation.   INTERVAL HISTORY:  Julie [name] 80 y.o. female returns to the clinic today for routine six-month followup visit. The patient is feeling fine today with no specific complaints except for arthralgia. She would have an ankle surgery and few weeks. She is still under the care of Dr. Ouida Sills for treatment of rheumatoid arthritis and fibromyalgia, and treated with methotrexate, prednisone and Vicodin. She denied having any significant chest pain, shortness of breath, cough or hemoptysis. She denied having any significant weight loss or night sweats. No bleeding issues. The patient has repeat CBC, comprehensive metabolic panel, LDH and myeloma panel performed recently and she is here for evaluation and discussion of her lab results.   MEDICAL HISTORY: Past Medical History  Diagnosis Date  . Arthritis   . Blood transfusion   . Cataract   . Heart murmur   . Hyperlipidemia   . Hypertension   . Interstitial cystitis   . Ulcer 1970    duodenal  . GERD (gastroesophageal reflux disease)     ALLERGIES:  is allergic to nitrofurantoin; nitrofurantoin monohyd macro; oxybutynin chloride; and sanctura.  MEDICATIONS:  Current Outpatient Prescriptions  Medication Sig Dispense Refill  . amLODipine-benazepril (LOTREL) 10-20 MG per capsule Take 1 capsule by mouth every morning.     . calcium-vitamin D (OSCAL WITH D) 500-200 MG-UNIT per tablet Take 1 tablet by mouth every morning.    . fish oil-omega-3 fatty acids 1000 MG capsule Take 1 g by mouth every morning.     . folic acid (FOLVITE) 1 MG tablet Take 1 mg by mouth every morning.     [name] HYDROcodone-acetaminophen (NORCO/VICODIN) 5-325 MG per tablet Take 1 tablet by  mouth 2 (two) times daily as needed. For pain    . lidocaine (LIDODERM) 5 % Place 1 patch onto the skin daily. Remove & Discard patch within 12 hours or as directed by MD    . losartan (COZAAR) 100 MG tablet Take 100 mg by mouth daily.    . meloxicam (MOBIC) 15 MG tablet Take 15 mg by mouth at bedtime.    . Methotrexate, Anti-Rheumatic, (METHOTREXATE, PF, [place]) Inject 0.8 mLs into the skin once a week. On friday    . metoprolol succinate (TOPROL-XL) 50 MG 24 hr tablet Take 50 mg by mouth at bedtime. Take with or immediately following a meal.    . Multiple Vitamins-Minerals (MULTIVITAMIN WITH MINERALS) tablet Take 1 tablet by mouth every morning.     . predniSONE (DELTASONE) 5 MG tablet Take 5 mg by mouth daily with breakfast.    . pregabalin (LYRICA) 75 MG capsule Take 75 mg by mouth 2 (two) times daily.    . colchicine 0.6 MG tablet Take 0.6 mg by mouth 2 (two) times daily.    . diazepam (VALIUM) 5 MG tablet Take 5 mg by mouth 2 (two) times daily as needed. Muscle spasm    . esomeprazole (NEXIUM) 40 MG capsule Take 40 mg by mouth daily before breakfast.       No current facility-administered medications for this visit.    SURGICAL HISTORY:  Past Surgical History  Procedure Laterality Date  . Upper gastrointestinal endoscopy    . Colonoscopy    . Vaginal hysterectomy    .  Bilateral oophorectomy      REVIEW OF SYSTEMS:  A comprehensive review of systems was negative except for: Constitutional: positive for fatigue Musculoskeletal: positive for arthralgias and myalgias   PHYSICAL EXAMINATION: General appearance: alert, cooperative, fatigued and no distress Head: Normocephalic, without obvious abnormality, atraumatic Neck: no adenopathy, no JVD, supple, symmetrical, trachea midline and thyroid not enlarged, symmetric, no tenderness/mass/nodules Lymph nodes: Cervical, supraclavicular, and axillary nodes normal. Resp: clear to auscultation bilaterally Back: symmetric, no curvature. ROM  normal. No CVA tenderness. Cardio: regular rate and rhythm, S1, S2 normal, no murmur, click, rub or gallop GI: soft, non-tender; bowel sounds normal; no masses,  no organomegaly Extremities: extremities normal, atraumatic, no cyanosis or edema  ECOG PERFORMANCE STATUS: 1 - Symptomatic but completely ambulatory  Blood pressure 139/74, pulse 60, temperature 97.8 F (36.6 C), resp. rate 18, height 5\' 4"  (1.626 m), weight 232 lb (105.235 kg).  LABORATORY DATA: Lab Results  Component Value Date   WBC 6.8 04/18/2014   HGB 11.8 04/18/2014   HCT 37.0 04/18/2014   MCV 87.9 04/18/2014   PLT 243 04/18/2014      Chemistry      Component Value Date/Time   NA 144 04/18/2014 0918   NA 141 06/12/2012 0912   K 3.6 04/18/2014 0918   K 3.8 06/12/2012 0912   CL 104 06/12/2012 0912   CL 108* 02/10/2012 0902   CO2 25 04/18/2014 0918   CO2 28 06/12/2012 0912   BUN 28.3* 04/18/2014 0918   BUN 18 06/12/2012 0912   CREATININE 1.1 04/18/2014 0918   CREATININE 0.71 06/12/2012 0912      Component Value Date/Time   CALCIUM 9.1 04/18/2014 0918   CALCIUM 9.2 06/12/2012 0912   ALKPHOS 79 04/18/2014 0918   ALKPHOS 107 08/14/2011 0954   AST 21 04/18/2014 0918   AST 17 08/14/2011 0954   ALT 16 04/18/2014 0918   ALT 12 08/14/2011 0954   BILITOT 0.62 04/18/2014 0918   BILITOT 0.4 08/14/2011 0954     Other lab results: Beta-2 microglobulin 2.77, IgG 725, IgA 696 and IgM 46. Free kappa light chain 5.70, free lambda light chain 2.10 with a kappa/lambda ratio 2.71  RADIOGRAPHIC STUDIES: No results found.  ASSESSMENT AND PLAN: This is a very pleasant 80 years old [place] American female with history of monoclonal gammopathy of unknown significance currently on observation. The patient has no evidence for disease progression based on the recent blood work,  I discussed the lab result with the patient. I recommended for her to continue observation for now with repeat CBC, comprehensive metabolic panel,  LDH and myeloma panel in 6 months.  She would come back for followup visit at that time.  She was advised to call me immediately if she has any concerning symptoms in the interval.  All questions were answered. The patient knows to call the clinic with any problems, questions or concerns. We can certainly see the patient much sooner if necessary. The patient voices understanding of current disease status and treatment options and is in agreement with the current care plan.  All questions were answered. The patient knows to call the clinic with any problems, questions or concerns. We can certainly see the patient much sooner if necessary.  Disclaimer: This note was dictated with voice recognition software. Similar sounding words can inadvertently be transcribed and may not be corrected upon review.

## 2014-04-27 MED ORDER — ONDANSETRON HCL 8 MG PO TABS
ORAL_TABLET | ORAL | Status: AC
  Filled 2014-04-27: qty 1

## 2014-04-30 ENCOUNTER — Encounter (HOSPITAL_COMMUNITY)
Admission: RE | Admit: 2014-04-30 | Discharge: 2014-04-30 | Disposition: A | Discharge status: Home / Self Care | Payer: Medicare Other | Source: Ambulatory Visit | Attending: Orthopedic Surgery | Admitting: Orthopedic Surgery

## 2014-04-30 ENCOUNTER — Encounter (HOSPITAL_COMMUNITY): Payer: Self-pay

## 2014-04-30 DIAGNOSIS — Z01812 Encounter for preprocedural laboratory examination: Secondary | ICD-10-CM | POA: Insufficient documentation

## 2014-04-30 HISTORY — DX: Personal history of other diseases of the musculoskeletal system and connective tissue: Z87.39

## 2014-04-30 HISTORY — DX: Unspecified glaucoma: H40.9

## 2014-04-30 HISTORY — DX: Fibromyalgia: M79.7

## 2014-04-30 HISTORY — DX: Personal history of other diseases of the digestive system: Z87.19

## 2014-04-30 HISTORY — DX: Personal history of colon polyps, unspecified: Z86.0100

## 2014-04-30 HISTORY — DX: Depression, unspecified: F32.A

## 2014-04-30 HISTORY — DX: Unspecified hearing loss, unspecified ear: H91.90

## 2014-04-30 HISTORY — DX: Type 2 diabetes mellitus without complications: E11.9

## 2014-04-30 HISTORY — DX: Polyneuropathy, unspecified: G62.9

## 2014-04-30 HISTORY — DX: Weakness: R53.1

## 2014-04-30 HISTORY — DX: Anemia, unspecified: D64.9

## 2014-04-30 HISTORY — DX: Pain in unspecified joint: M25.50

## 2014-04-30 HISTORY — DX: Major depressive disorder, single episode, unspecified: F32.9

## 2014-04-30 HISTORY — DX: Histoplasmosis, unspecified: B39.9

## 2014-04-30 HISTORY — DX: Urgency of urination: R39.15

## 2014-04-30 HISTORY — DX: Nocturia: R35.1

## 2014-04-30 HISTORY — DX: Frequency of micturition: R35.0

## 2014-04-30 HISTORY — DX: Adverse effect of unspecified anesthetic, initial encounter: T41.45XA

## 2014-04-30 HISTORY — DX: Personal history of other infectious and parasitic diseases: Z86.19

## 2014-04-30 HISTORY — DX: Anxiety disorder, unspecified: F41.9

## 2014-04-30 HISTORY — DX: Other complications of anesthesia, initial encounter: T88.59XA

## 2014-04-30 HISTORY — DX: Unspecified cataract: H26.9

## 2014-04-30 HISTORY — DX: Personal history of colonic polyps: Z86.010

## 2014-04-30 LAB — CBC
HCT: 37.5 % (ref 36.0–46.0)
HEMOGLOBIN: 11.8 g/dL — AB (ref 12.0–15.0)
MCH: 27.9 pg (ref 26.0–34.0)
MCHC: 31.5 g/dL (ref 30.0–36.0)
MCV: 88.7 fL (ref 78.0–100.0)
Platelets: 238 10*3/uL (ref 150–400)
RBC: 4.23 MIL/uL (ref 3.87–5.11)
RDW: 15.3 % (ref 11.5–15.5)
WBC: 5.2 10*3/uL (ref 4.0–10.5)

## 2014-04-30 LAB — BASIC METABOLIC PANEL
ANION GAP: 9 (ref 5–15)
BUN: 24 mg/dL — ABNORMAL HIGH (ref 6–23)
CHLORIDE: 106 meq/L (ref 96–112)
CO2: 30 mEq/L (ref 19–32)
Calcium: 9.5 mg/dL (ref 8.4–10.5)
Creatinine, Ser: 0.99 mg/dL (ref 0.50–1.10)
GFR calc Af Amer: 61 mL/min — ABNORMAL LOW (ref >90)
GFR, EST NON AFRICAN AMERICAN: 53 mL/min — AB (ref >90)
Glucose, Bld: 86 mg/dL (ref 70–99)
Potassium: 4.8 mEq/L (ref 3.7–5.3)
Sodium: 145 mEq/L (ref 137–147)

## 2014-04-30 LAB — SURGICAL PCR SCREEN
MRSA, PCR: NEGATIVE
STAPHYLOCOCCUS AUREUS: NEGATIVE

## 2014-04-30 NOTE — Pre-Procedure Instructions (Signed)
Julie [name]  04/30/2014   Your procedure is scheduled on:  Thurs, Dec 17 @ 1:30 PM   Report to [facility]  at 11:30 AM.   Call this number if you have problems the morning of surgery: [phone]   Remember:   Do not eat food or drink liquids after midnight.    Take these medicines the morning of surgery with A SIP OF WATER: Amlodipine(Norvasc),Nexium(Esomeprazole),Prednisone(Deltasone),and Pregabalin(Lyrica)              Stop taking your Methotrexate,Mobic,and Fish Oil. No Goody's,BC's,Aleve,Ibuprofen,Aspirin,or any Herbal Medications   Do not wear jewelry, make-up or nail polish.   Do not wear lotions, powders, or perfumes. You may wear deodorant.   Do not shave 48 hours prior to surgery.    Do not bring valuables to the hospital.  [facility] is not responsible                  for any belongings or valuables.               Contacts, dentures or bridgework may not be worn into surgery.   Leave suitcase in the car. After surgery it may be brought to your room.   For patients admitted to the hospital, discharge time is determined by your                treatment team.               Patients discharged the day of surgery will not be allowed to drive  home.    Special Instructions:  [facility] - Preparing for Surgery  Before surgery, you can play an important role.  Because skin is not sterile, your skin needs to be as free of germs as possible.  You can reduce the number of germs on you skin by washing with CHG (chlorahexidine gluconate) soap before surgery.  CHG is an antiseptic cleaner which kills germs and bonds with the skin to continue killing germs even after washing.  Please DO NOT use if you have an allergy to CHG or antibacterial soaps.  If your skin becomes reddened/irritated stop using the CHG and inform your nurse when you arrive at Short Stay.  Do not shave (including legs and underarms) for at least 48 hours prior to the first CHG shower.  You may  shave your face.  Please follow these instructions carefully:   1.  Shower with CHG Soap the night before surgery and the                                morning of Surgery.  2.  If you choose to wash your hair, wash your hair first as usual with your       normal shampoo.  3.  After you shampoo, rinse your hair and body thoroughly to remove the                      Shampoo.  4.  Use CHG as you would any other liquid soap.  You can apply chg directly       to the skin and wash gently with scrungie or a clean washcloth.  5.  Apply the CHG Soap to your body ONLY FROM THE NECK DOWN.        Do not use on open wounds or open sores.  Avoid contact with your eyes,  ears, mouth and genitals (private parts).  Wash genitals (private parts)       with your normal soap.  6.  Wash thoroughly, paying special attention to the area where your surgery        will be performed.  7.  Thoroughly rinse your body with warm water from the neck down.  8.  DO NOT shower/wash with your normal soap after using and rinsing off       the CHG Soap.  9.  Pat yourself dry with a clean towel.            10.  Wear clean pajamas.            11.  Place clean sheets on your bed the night of your first shower and do not        sleep with pets.  Day of Surgery  Do not apply any lotions/deoderants the morning of surgery.  Please wear clean clothes to the hospital/surgery center.     Please read over the following fact sheets that you were given: Pain Booklet, Coughing and Deep Breathing, MRSA Information and Surgical Site Infection Prevention

## 2014-04-30 NOTE — Progress Notes (Signed)
Pt doesn't have a cardiologist  Stress test done about a month ago to be requested from Dr.Pang   Echo done about a month ago to be requested from Dr.Pang  EKG in chart from 03-01-14  Medical Md is Dr.Richard Minna Antis  Denies CXR in past yr

## 2014-04-30 NOTE — Progress Notes (Signed)
   04/30/14 0851  OBSTRUCTIVE SLEEP APNEA  Have you ever been diagnosed with sleep apnea through a sleep study? No  Do you snore loudly (loud enough to be heard through closed doors)?  0  Do you often feel tired, fatigued, or sleepy during the daytime? 0  Has anyone observed you stop breathing during your sleep? 0  Do you have, or are you being treated for high blood pressure? 1  BMI more than 35 kg/m2? 1  Age over 78 years old? 1  Neck circumference greater than 40 cm/16 inches? 1  Gender: 0  Obstructive Sleep Apnea Score 4  Score 4 or greater  Results sent to PCP

## 2014-05-07 DIAGNOSIS — I10 Essential (primary) hypertension: Secondary | ICD-10-CM | POA: Diagnosis not present

## 2014-05-07 DIAGNOSIS — E1165 Type 2 diabetes mellitus with hyperglycemia: Secondary | ICD-10-CM | POA: Diagnosis not present

## 2014-05-09 ENCOUNTER — Other Ambulatory Visit: Payer: Self-pay | Admitting: Orthopedic Surgery

## 2014-05-10 ENCOUNTER — Inpatient Hospital Stay (HOSPITAL_COMMUNITY): Payer: Medicare Other | Admitting: Certified Registered"

## 2014-05-10 ENCOUNTER — Inpatient Hospital Stay (HOSPITAL_COMMUNITY): Payer: Medicare Other

## 2014-05-10 ENCOUNTER — Encounter (HOSPITAL_COMMUNITY): Payer: Self-pay | Admitting: Certified Registered"

## 2014-05-10 ENCOUNTER — Encounter (HOSPITAL_COMMUNITY)
Admission: RE | Disposition: A | Discharge status: Home / Self Care | Payer: Self-pay | Source: Ambulatory Visit | Attending: Orthopedic Surgery

## 2014-05-10 ENCOUNTER — Inpatient Hospital Stay (HOSPITAL_COMMUNITY)
Admission: RE | Admit: 2014-05-10 | Discharge: 2014-05-14 | DRG: 494 | Disposition: A | Discharge status: Home / Self Care | Payer: Medicare Other | Source: Ambulatory Visit | Attending: Orthopedic Surgery | Admitting: Orthopedic Surgery

## 2014-05-10 DIAGNOSIS — H269 Unspecified cataract: Secondary | ICD-10-CM | POA: Diagnosis present

## 2014-05-10 DIAGNOSIS — M19179 Post-traumatic osteoarthritis, unspecified ankle and foot: Secondary | ICD-10-CM | POA: Diagnosis present

## 2014-05-10 DIAGNOSIS — M12072 Chronic postrheumatic arthropathy [Jaccoud], left ankle and foot: Secondary | ICD-10-CM | POA: Diagnosis not present

## 2014-05-10 DIAGNOSIS — F329 Major depressive disorder, single episode, unspecified: Secondary | ICD-10-CM | POA: Diagnosis present

## 2014-05-10 DIAGNOSIS — M797 Fibromyalgia: Secondary | ICD-10-CM | POA: Diagnosis present

## 2014-05-10 DIAGNOSIS — M19172 Post-traumatic osteoarthritis, left ankle and foot: Principal | ICD-10-CM | POA: Diagnosis present

## 2014-05-10 DIAGNOSIS — I10 Essential (primary) hypertension: Secondary | ICD-10-CM | POA: Diagnosis present

## 2014-05-10 DIAGNOSIS — Z888 Allergy status to other drugs, medicaments and biological substances status: Secondary | ICD-10-CM | POA: Diagnosis not present

## 2014-05-10 DIAGNOSIS — Z419 Encounter for procedure for purposes other than remedying health state, unspecified: Secondary | ICD-10-CM

## 2014-05-10 DIAGNOSIS — Z981 Arthrodesis status: Secondary | ICD-10-CM | POA: Diagnosis not present

## 2014-05-10 DIAGNOSIS — E119 Type 2 diabetes mellitus without complications: Secondary | ICD-10-CM | POA: Diagnosis present

## 2014-05-10 DIAGNOSIS — M12572 Traumatic arthropathy, left ankle and foot: Secondary | ICD-10-CM | POA: Diagnosis not present

## 2014-05-10 DIAGNOSIS — M25572 Pain in left ankle and joints of left foot: Secondary | ICD-10-CM | POA: Diagnosis not present

## 2014-05-10 DIAGNOSIS — K219 Gastro-esophageal reflux disease without esophagitis: Secondary | ICD-10-CM | POA: Diagnosis present

## 2014-05-10 DIAGNOSIS — E785 Hyperlipidemia, unspecified: Secondary | ICD-10-CM | POA: Diagnosis present

## 2014-05-10 DIAGNOSIS — Z91018 Allergy to other foods: Secondary | ICD-10-CM | POA: Diagnosis not present

## 2014-05-10 DIAGNOSIS — Z86010 Personal history of colonic polyps: Secondary | ICD-10-CM | POA: Diagnosis not present

## 2014-05-10 DIAGNOSIS — F419 Anxiety disorder, unspecified: Secondary | ICD-10-CM | POA: Diagnosis present

## 2014-05-10 DIAGNOSIS — H9190 Unspecified hearing loss, unspecified ear: Secondary | ICD-10-CM | POA: Diagnosis present

## 2014-05-10 DIAGNOSIS — M109 Gout, unspecified: Secondary | ICD-10-CM | POA: Diagnosis present

## 2014-05-10 DIAGNOSIS — Z79899 Other long term (current) drug therapy: Secondary | ICD-10-CM | POA: Diagnosis not present

## 2014-05-10 DIAGNOSIS — Z9049 Acquired absence of other specified parts of digestive tract: Secondary | ICD-10-CM | POA: Diagnosis present

## 2014-05-10 DIAGNOSIS — G8918 Other acute postprocedural pain: Secondary | ICD-10-CM | POA: Diagnosis not present

## 2014-05-10 DIAGNOSIS — G629 Polyneuropathy, unspecified: Secondary | ICD-10-CM | POA: Diagnosis present

## 2014-05-10 DIAGNOSIS — H409 Unspecified glaucoma: Secondary | ICD-10-CM | POA: Diagnosis present

## 2014-05-10 DIAGNOSIS — S82872S Displaced pilon fracture of left tibia, sequela: Secondary | ICD-10-CM | POA: Diagnosis not present

## 2014-05-10 HISTORY — PX: ANKLE FUSION: SHX881

## 2014-05-10 LAB — CBC
HEMATOCRIT: 36.9 % (ref 36.0–46.0)
Hemoglobin: 11.6 g/dL — ABNORMAL LOW (ref 12.0–15.0)
MCH: 27.8 pg (ref 26.0–34.0)
MCHC: 31.4 g/dL (ref 30.0–36.0)
MCV: 88.3 fL (ref 78.0–100.0)
Platelets: 224 10*3/uL (ref 150–400)
RBC: 4.18 MIL/uL (ref 3.87–5.11)
RDW: 15 % (ref 11.5–15.5)
WBC: 9.4 10*3/uL (ref 4.0–10.5)

## 2014-05-10 LAB — CREATININE, SERUM
Creatinine, Ser: 0.85 mg/dL (ref 0.50–1.10)
GFR, EST AFRICAN AMERICAN: 74 mL/min — AB (ref >90)
GFR, EST NON AFRICAN AMERICAN: 63 mL/min — AB (ref >90)

## 2014-05-10 LAB — GLUCOSE, CAPILLARY
Glucose-Capillary: 127 mg/dL — ABNORMAL HIGH (ref 70–99)
Glucose-Capillary: 87 mg/dL (ref 70–99)

## 2014-05-10 SURGERY — ARTHRODESIS ANKLE
Anesthesia: Regional | Site: Ankle | Laterality: Left

## 2014-05-10 MED ORDER — OXYCODONE HCL 5 MG PO TABS: 5.0000 mg | ORAL_TABLET | Freq: Once | ORAL | Status: DC | PRN

## 2014-05-10 MED ORDER — ONDANSETRON HCL 4 MG/2ML IJ SOLN
INTRAMUSCULAR | Status: DC | PRN
  Administered 2014-05-10: 4 mg via INTRAVENOUS

## 2014-05-10 MED ORDER — ONDANSETRON HCL 4 MG/2ML IJ SOLN: 4.0000 mg | Freq: Four times a day (QID) | INTRAMUSCULAR | Status: DC | PRN

## 2014-05-10 MED ORDER — EPHEDRINE SULFATE 50 MG/ML IJ SOLN
INTRAMUSCULAR | Status: DC | PRN
  Administered 2014-05-10: 10 mg via INTRAVENOUS
  Administered 2014-05-10: 5 mg via INTRAVENOUS
  Administered 2014-05-10: 10 mg via INTRAVENOUS
  Administered 2014-05-10: 5 mg via INTRAVENOUS

## 2014-05-10 MED ORDER — PANTOPRAZOLE SODIUM 40 MG PO TBEC
80.0000 mg | DELAYED_RELEASE_TABLET | Freq: Every day | ORAL | Status: DC
  Administered 2014-05-12 – 2014-05-14 (×3): 80 mg via ORAL
  Filled 2014-05-10 (×4): qty 2

## 2014-05-10 MED ORDER — FENTANYL CITRATE 0.05 MG/ML IJ SOLN
INTRAMUSCULAR | Status: AC
  Filled 2014-05-10: qty 2

## 2014-05-10 MED ORDER — FENTANYL CITRATE 0.05 MG/ML IJ SOLN
INTRAMUSCULAR | Status: AC
  Filled 2014-05-10: qty 5

## 2014-05-10 MED ORDER — HYDROMORPHONE HCL 1 MG/ML IJ SOLN
0.5000 mg | INTRAMUSCULAR | Status: DC | PRN
  Administered 2014-05-12: 1 mg via INTRAVENOUS
  Filled 2014-05-10: qty 1

## 2014-05-10 MED ORDER — MIDAZOLAM HCL 5 MG/5ML IJ SOLN
INTRAMUSCULAR | Status: DC | PRN
  Administered 2014-05-10 (×4): 0.5 mg via INTRAVENOUS

## 2014-05-10 MED ORDER — ROPIVACAINE HCL 5 MG/ML IJ SOLN
INTRAMUSCULAR | Status: DC | PRN
  Administered 2014-05-10: 30 mL via PERINEURAL

## 2014-05-10 MED ORDER — CLONAZEPAM 0.5 MG PO TABS
0.5000 mg | ORAL_TABLET | Freq: Every day | ORAL | Status: DC
  Administered 2014-05-10 – 2014-05-13 (×4): 0.5 mg via ORAL
  Filled 2014-05-10 (×4): qty 1

## 2014-05-10 MED ORDER — PROPOFOL 10 MG/ML IV BOLUS
INTRAVENOUS | Status: AC
  Filled 2014-05-10: qty 20

## 2014-05-10 MED ORDER — CEFAZOLIN SODIUM-DEXTROSE 2-3 GM-% IV SOLR
2.0000 g | INTRAVENOUS | Status: AC
  Administered 2014-05-10: 2 g via INTRAVENOUS

## 2014-05-10 MED ORDER — LOSARTAN POTASSIUM 50 MG PO TABS
100.0000 mg | ORAL_TABLET | Freq: Every day | ORAL | Status: DC
  Administered 2014-05-11 – 2014-05-14 (×3): 100 mg via ORAL
  Filled 2014-05-10 (×5): qty 2

## 2014-05-10 MED ORDER — PREDNISONE 5 MG PO TABS
5.0000 mg | ORAL_TABLET | Freq: Every day | ORAL | Status: DC
  Administered 2014-05-11 – 2014-05-14 (×4): 5 mg via ORAL
  Filled 2014-05-10 (×6): qty 1

## 2014-05-10 MED ORDER — SODIUM CHLORIDE 0.9 % IV SOLN: INTRAVENOUS | Status: DC

## 2014-05-10 MED ORDER — LACTATED RINGERS IV SOLN
INTRAVENOUS | Status: DC | PRN
  Administered 2014-05-10: 14:00:00 via INTRAVENOUS

## 2014-05-10 MED ORDER — CEFAZOLIN SODIUM-DEXTROSE 2-3 GM-% IV SOLR
INTRAVENOUS | Status: AC
  Administered 2014-05-10: 13:00:00
  Filled 2014-05-10: qty 50

## 2014-05-10 MED ORDER — PREGABALIN 75 MG PO CAPS
75.0000 mg | ORAL_CAPSULE | Freq: Two times a day (BID) | ORAL | Status: DC
  Administered 2014-05-10 – 2014-05-14 (×8): 75 mg via ORAL
  Filled 2014-05-10 (×8): qty 1

## 2014-05-10 MED ORDER — OXYCODONE HCL 5 MG PO TABS
5.0000 mg | ORAL_TABLET | ORAL | Status: DC | PRN
  Administered 2014-05-11: 10 mg via ORAL
  Administered 2014-05-11 (×2): 5 mg via ORAL
  Administered 2014-05-12 (×2): 10 mg via ORAL
  Administered 2014-05-12 (×2): 5 mg via ORAL
  Administered 2014-05-13 (×2): 10 mg via ORAL
  Administered 2014-05-14: 5 mg via ORAL
  Filled 2014-05-10 (×2): qty 1
  Filled 2014-05-10 (×2): qty 2
  Filled 2014-05-10 (×2): qty 1
  Filled 2014-05-10: qty 2
  Filled 2014-05-10: qty 1
  Filled 2014-05-10 (×3): qty 2

## 2014-05-10 MED ORDER — INSULIN ASPART 100 UNIT/ML ~~LOC~~ SOLN
0.0000 [IU] | Freq: Three times a day (TID) | SUBCUTANEOUS | Status: DC
  Administered 2014-05-11 – 2014-05-12 (×2): 2 [IU] via SUBCUTANEOUS

## 2014-05-10 MED ORDER — ONDANSETRON HCL 4 MG/2ML IJ SOLN
INTRAMUSCULAR | Status: AC
  Filled 2014-05-10: qty 2

## 2014-05-10 MED ORDER — DOCUSATE SODIUM 100 MG PO CAPS
100.0000 mg | ORAL_CAPSULE | Freq: Two times a day (BID) | ORAL | Status: DC
  Administered 2014-05-10 – 2014-05-13 (×7): 100 mg via ORAL
  Filled 2014-05-10 (×8): qty 1

## 2014-05-10 MED ORDER — OXYCODONE HCL 5 MG/5ML PO SOLN: 5.0000 mg | Freq: Once | ORAL | Status: DC | PRN

## 2014-05-10 MED ORDER — BUPIVACAINE-EPINEPHRINE (PF) 0.5% -1:200000 IJ SOLN
INTRAMUSCULAR | Status: DC | PRN
  Administered 2014-05-10: 12 mL via PERINEURAL

## 2014-05-10 MED ORDER — CHLORHEXIDINE GLUCONATE 4 % EX LIQD
60.0000 mL | Freq: Once | CUTANEOUS | Status: DC
  Filled 2014-05-10: qty 60

## 2014-05-10 MED ORDER — ROCURONIUM BROMIDE 50 MG/5ML IV SOLN
INTRAVENOUS | Status: AC
  Filled 2014-05-10: qty 1

## 2014-05-10 MED ORDER — FOLIC ACID 1 MG PO TABS
2.0000 mg | ORAL_TABLET | Freq: Every morning | ORAL | Status: DC
  Administered 2014-05-11 – 2014-05-14 (×4): 2 mg via ORAL
  Filled 2014-05-10 (×4): qty 2

## 2014-05-10 MED ORDER — BACITRACIN ZINC 500 UNIT/GM EX OINT
TOPICAL_OINTMENT | CUTANEOUS | Status: AC
  Filled 2014-05-10: qty 15

## 2014-05-10 MED ORDER — LACTATED RINGERS IV SOLN
INTRAVENOUS | Status: DC
  Administered 2014-05-10: 13:00:00 via INTRAVENOUS

## 2014-05-10 MED ORDER — AMLODIPINE BESYLATE 10 MG PO TABS
10.0000 mg | ORAL_TABLET | Freq: Every day | ORAL | Status: DC
  Administered 2014-05-11 – 2014-05-14 (×3): 10 mg via ORAL
  Filled 2014-05-10 (×5): qty 1

## 2014-05-10 MED ORDER — MIDAZOLAM HCL 2 MG/2ML IJ SOLN
INTRAMUSCULAR | Status: AC
  Filled 2014-05-10: qty 2

## 2014-05-10 MED ORDER — GLYCOPYRROLATE 0.2 MG/ML IJ SOLN
INTRAMUSCULAR | Status: AC
  Filled 2014-05-10: qty 3

## 2014-05-10 MED ORDER — METHOCARBAMOL 1000 MG/10ML IJ SOLN
500.0000 mg | Freq: Four times a day (QID) | INTRAVENOUS | Status: DC | PRN
  Filled 2014-05-10: qty 5

## 2014-05-10 MED ORDER — LIDOCAINE HCL (CARDIAC) 20 MG/ML IV SOLN
INTRAVENOUS | Status: DC | PRN
  Administered 2014-05-10: 160 mg via INTRAVENOUS

## 2014-05-10 MED ORDER — PROPOFOL 10 MG/ML IV BOLUS
INTRAVENOUS | Status: DC | PRN
  Administered 2014-05-10: 200 mg via INTRAVENOUS

## 2014-05-10 MED ORDER — FENTANYL CITRATE 0.05 MG/ML IJ SOLN
INTRAMUSCULAR | Status: DC | PRN
  Administered 2014-05-10: 50 ug via INTRAVENOUS
  Administered 2014-05-10: 75 ug via INTRAVENOUS
  Administered 2014-05-10 (×3): 25 ug via INTRAVENOUS

## 2014-05-10 MED ORDER — ENOXAPARIN SODIUM 40 MG/0.4ML ~~LOC~~ SOLN
40.0000 mg | SUBCUTANEOUS | Status: DC
  Administered 2014-05-11 – 2014-05-14 (×4): 40 mg via SUBCUTANEOUS
  Filled 2014-05-10 (×6): qty 0.4

## 2014-05-10 MED ORDER — METHOCARBAMOL 500 MG PO TABS
500.0000 mg | ORAL_TABLET | Freq: Four times a day (QID) | ORAL | Status: DC | PRN
  Administered 2014-05-11: 500 mg via ORAL
  Filled 2014-05-10: qty 1

## 2014-05-10 MED ORDER — METOPROLOL SUCCINATE ER 50 MG PO TB24
50.0000 mg | ORAL_TABLET | Freq: Every day | ORAL | Status: DC
  Administered 2014-05-10 – 2014-05-13 (×4): 50 mg via ORAL
  Filled 2014-05-10 (×6): qty 1

## 2014-05-10 MED ORDER — HYDROMORPHONE HCL 1 MG/ML IJ SOLN: 0.2500 mg | INTRAMUSCULAR | Status: DC | PRN

## 2014-05-10 MED ORDER — LIDOCAINE HCL (CARDIAC) 20 MG/ML IV SOLN
INTRAVENOUS | Status: AC
  Filled 2014-05-10: qty 5

## 2014-05-10 MED ORDER — ROCURONIUM BROMIDE 100 MG/10ML IV SOLN
INTRAVENOUS | Status: DC | PRN
  Administered 2014-05-10: 35 mg via INTRAVENOUS

## 2014-05-10 MED ORDER — 0.9 % SODIUM CHLORIDE (POUR BTL) OPTIME
TOPICAL | Status: DC | PRN
  Administered 2014-05-10: 1000 mL

## 2014-05-10 MED ORDER — ONDANSETRON HCL 4 MG PO TABS: 4.0000 mg | ORAL_TABLET | Freq: Four times a day (QID) | ORAL | Status: DC | PRN

## 2014-05-10 MED ORDER — SENNA 8.6 MG PO TABS
2.0000 | ORAL_TABLET | Freq: Two times a day (BID) | ORAL | Status: DC
  Administered 2014-05-10 – 2014-05-13 (×7): 17.2 mg via ORAL
  Filled 2014-05-10 (×10): qty 2

## 2014-05-10 MED ORDER — HYDROCORTISONE 2.5 % RE CREA
1.0000 "application " | TOPICAL_CREAM | Freq: Two times a day (BID) | RECTAL | Status: DC
  Filled 2014-05-10 (×2): qty 28.35

## 2014-05-10 MED ORDER — CALCIUM CARBONATE-VITAMIN D 500-200 MG-UNIT PO TABS
1.0000 | ORAL_TABLET | Freq: Every morning | ORAL | Status: DC
  Administered 2014-05-11 – 2014-05-14 (×4): 1 via ORAL
  Filled 2014-05-10 (×4): qty 1

## 2014-05-10 SURGICAL SUPPLY — 63 items
BANDAGE ESMARK 6X9 LF (GAUZE/BANDAGES/DRESSINGS) ×1 IMPLANT
BLADE SAW SGTL HD 18.5X60.5X1. (BLADE) ×3 IMPLANT
BLADE SURG 10 STRL SS (BLADE) ×3 IMPLANT
BNDG COHESIVE 4X5 TAN STRL (GAUZE/BANDAGES/DRESSINGS) ×3 IMPLANT
BNDG COHESIVE 6X5 TAN STRL LF (GAUZE/BANDAGES/DRESSINGS) ×3 IMPLANT
BNDG ESMARK 6X9 LF (GAUZE/BANDAGES/DRESSINGS) ×3
CANISTER SUCT 3000ML (MISCELLANEOUS) ×3 IMPLANT
CHLORAPREP W/TINT 10.5 ML (MISCELLANEOUS) IMPLANT
CHLORAPREP W/TINT 26ML (MISCELLANEOUS) ×3 IMPLANT
CLOSURE WOUND 1/2 X4 (GAUZE/BANDAGES/DRESSINGS)
COVER SURGICAL LIGHT HANDLE (MISCELLANEOUS) ×3 IMPLANT
CUFF TOURNIQUET SINGLE 34IN LL (TOURNIQUET CUFF) ×3 IMPLANT
CUFF TOURNIQUET SINGLE 44IN (TOURNIQUET CUFF) IMPLANT
DRAPE C-ARM 42X72 X-RAY (DRAPES) ×3 IMPLANT
DRAPE C-ARMOR (DRAPES) ×3 IMPLANT
DRAPE INCISE IOBAN 66X45 STRL (DRAPES) ×3 IMPLANT
DRAPE U-SHAPE 47X51 STRL (DRAPES) ×3 IMPLANT
DRSG ADAPTIC 3X8 NADH LF (GAUZE/BANDAGES/DRESSINGS) IMPLANT
DRSG MEPITEL 4X7.2 (GAUZE/BANDAGES/DRESSINGS) ×3 IMPLANT
DRSG PAD ABDOMINAL 8X10 ST (GAUZE/BANDAGES/DRESSINGS) ×6 IMPLANT
DRSG TEGADERM 4X4.75 (GAUZE/BANDAGES/DRESSINGS) IMPLANT
ELECT REM PT RETURN 9FT ADLT (ELECTROSURGICAL) ×3
ELECTRODE REM PT RTRN 9FT ADLT (ELECTROSURGICAL) ×1 IMPLANT
GAUZE SPONGE 2X2 8PLY STRL LF (GAUZE/BANDAGES/DRESSINGS) IMPLANT
GAUZE SPONGE 4X4 12PLY STRL (GAUZE/BANDAGES/DRESSINGS) ×3 IMPLANT
GLOVE BIO SURGEON STRL SZ7 (GLOVE) ×6 IMPLANT
GLOVE BIO SURGEON STRL SZ8 (GLOVE) ×3 IMPLANT
GLOVE BIOGEL PI IND STRL 8 (GLOVE) ×1 IMPLANT
GLOVE BIOGEL PI INDICATOR 8 (GLOVE) ×2
GOWN STRL REUS W/ TWL LRG LVL3 (GOWN DISPOSABLE) ×2 IMPLANT
GOWN STRL REUS W/ TWL XL LVL3 (GOWN DISPOSABLE) ×1 IMPLANT
GOWN STRL REUS W/TWL LRG LVL3 (GOWN DISPOSABLE) ×4
GOWN STRL REUS W/TWL XL LVL3 (GOWN DISPOSABLE) ×2
KIT BASIN OR (CUSTOM PROCEDURE TRAY) ×3 IMPLANT
KIT ROOM TURNOVER OR (KITS) ×3 IMPLANT
NEEDLE 22X1 1/2 (OR ONLY) (NEEDLE) IMPLANT
NS IRRIG 1000ML POUR BTL (IV SOLUTION) ×3 IMPLANT
PACK ORTHO EXTREMITY (CUSTOM PROCEDURE TRAY) ×3 IMPLANT
PAD ARMBOARD 7.5X6 YLW CONV (MISCELLANEOUS) ×6 IMPLANT
PAD CAST 4YDX4 CTTN HI CHSV (CAST SUPPLIES) ×2 IMPLANT
PADDING CAST COTTON 4X4 STRL (CAST SUPPLIES) ×4
PUTTY DBM STAGRAFT PLUS 5CC (Putty) ×3 IMPLANT
SCREW COMP NEXFIX 6.5X40X14MM (Screw) ×3 IMPLANT
SCREW COMP NEXFIX 6.5X45X16MM (Screw) ×6 IMPLANT
SCREW COMPRESSION 6.5X70MM (Screw) ×3 IMPLANT
SOAP 2 % CHG 4 OZ (WOUND CARE) IMPLANT
SPONGE GAUZE 2X2 STER 10/PKG (GAUZE/BANDAGES/DRESSINGS)
SPONGE LAP 18X18 X RAY DECT (DISPOSABLE) ×3 IMPLANT
STAPLER VISISTAT 35W (STAPLE) IMPLANT
STRIP CLOSURE SKIN 1/2X4 (GAUZE/BANDAGES/DRESSINGS) IMPLANT
SUCTION FRAZIER TIP 10 FR DISP (SUCTIONS) ×3 IMPLANT
SUT PROLENE 3 0 PS 2 (SUTURE) ×3 IMPLANT
SUT VIC AB 2-0 CT1 27 (SUTURE) ×4
SUT VIC AB 2-0 CT1 TAPERPNT 27 (SUTURE) ×2 IMPLANT
SUT VIC AB 3-0 PS2 18 (SUTURE) ×2
SUT VIC AB 3-0 PS2 18XBRD (SUTURE) ×1 IMPLANT
SYR CONTROL 10ML LL (SYRINGE) IMPLANT
TOWEL OR 17X24 6PK STRL BLUE (TOWEL DISPOSABLE) ×3 IMPLANT
TOWEL OR 17X26 10 PK STRL BLUE (TOWEL DISPOSABLE) ×3 IMPLANT
TUBE CONNECTING 12'X1/4 (SUCTIONS) ×1
TUBE CONNECTING 12X1/4 (SUCTIONS) ×2 IMPLANT
WASHER 6.5 (Washer) ×3 IMPLANT
WASHER COMP NEXFIX 6.5X11MM (Washer) ×3 IMPLANT

## 2014-05-10 NOTE — Anesthesia Preprocedure Evaluation (Addendum)
Anesthesia Evaluation  Patient identified by MRN, date of birth, ID band Patient awake    Reviewed: Allergy & Precautions, H&P , NPO status , Patient's Chart, lab work & pertinent test results  Airway Mallampati: I  TM Distance: >3 FB Neck ROM: Full    Dental  (+) Teeth Intact, Dental Advisory Given, Chipped   Pulmonary neg pulmonary ROS,  breath sounds clear to auscultation        Cardiovascular hypertension, Pt. on medications and Pt. on home beta blockers - angina- Past MI and - CHF Rhythm:Regular     Neuro/Psych PSYCHIATRIC DISORDERS Anxiety Depression  Neuromuscular disease    GI/Hepatic Neg liver ROS, hiatal hernia, GERD-  Medicated and Controlled,  Endo/Other  diabetes, Type 2Morbid obesity  Renal/GU negative Renal ROS     Musculoskeletal  (+) Arthritis -, on steriods ,  Fibromyalgia -  Abdominal   Peds  Hematology   Anesthesia Other Findings   Reproductive/Obstetrics                            Anesthesia Physical Anesthesia Plan  ASA: III  Anesthesia Plan: General and Regional   Post-op Pain Management:    Induction: Intravenous  Airway Management Planned: LMA  Additional Equipment: None  Intra-op Plan:   Post-operative Plan: Extubation in OR  Informed Consent: I have reviewed the patients History and Physical, chart, labs and discussed the procedure including the risks, benefits and alternatives for the proposed anesthesia with the patient or authorized representative who has indicated his/her understanding and acceptance.   Dental advisory given  Plan Discussed with: CRNA and Surgeon  Anesthesia Plan Comments:         Anesthesia Quick Evaluation

## 2014-05-10 NOTE — Anesthesia Postprocedure Evaluation (Signed)
  Anesthesia Post-op Note  Patient: Julie [name]  Procedure(s) Performed: Procedure(s): LEFT ANKLE ARTHRODESIS  (Left)  Patient Location: PACU  Anesthesia Type:General and block  Level of Consciousness: awake and alert   Airway and Oxygen Therapy: Patient Spontanous Breathing  Post-op Pain: none  Post-op Assessment: Post-op Vital signs reviewed, Patient's Cardiovascular Status Stable and Respiratory Function Stable  Post-op Vital Signs: Reviewed  Filed Vitals:   05/10/14 1615  BP:   Pulse: 72  Temp:   Resp: 16    Complications: No apparent anesthesia complications

## 2014-05-10 NOTE — Anesthesia Procedure Notes (Addendum)
Procedure Name: Intubation Date/Time: 05/10/2014 1:57 PM Performed by: Octavio Graves Pre-anesthesia Checklist: Patient identified, Emergency Drugs available, Suction available, Patient being monitored and Timeout performed Patient Re-evaluated:Patient Re-evaluated prior to inductionOxygen Delivery Method: Circle system utilized Preoxygenation: Pre-oxygenation with 100% oxygen Intubation Type: IV induction Ventilation: Mask ventilation without difficulty Laryngoscope Size: Miller and 2 Grade View: Grade I Tube type: Oral Tube size: 7.0 mm Number of attempts: 1 Airway Equipment and Method: Stylet Placement Confirmation: ETT inserted through vocal cords under direct vision,  positive ETCO2 and breath sounds checked- equal and bilateral Secured at: 22 cm Tube secured with: Tape Dental Injury: Teeth and Oropharynx as per pre-operative assessment  Comments: IV induction Smith- intubation AM CRNA- atraumatic- front teeth chipped prior to laryngoscopy-     Anesthesia Regional Block:  Popliteal block  Pre-Anesthetic Checklist: ,, timeout performed, Correct Patient, Correct Site, Correct Laterality, Correct Procedure, Correct Position, site marked, Risks and benefits discussed,  Surgical consent,  Pre-op evaluation,  At surgeon's request and post-op pain management  Laterality: Lower and Left  Prep: chloraprep       Needles:  Injection technique: Single-shot  Needle Type: Echogenic Stimulator Needle          Additional Needles:  Procedures: ultrasound guided (picture in chart) and nerve stimulator Popliteal block  Nerve Stimulator or Paresthesia:  Response: eversion, 0.5 mA,   Additional Responses:   Narrative:  Injection made incrementally with aspirations every 5 mL.  Performed by: Personally   Additional Notes: H+P and labs reviewed, risks and benefits discussed with patient, procedure tolerated well without complications. Left saphenous nerve block performed after  sterile prep and drape. Neg aspiration every 19ml

## 2014-05-10 NOTE — Transfer of Care (Signed)
Immediate Anesthesia Transfer of Care Note  Patient: Julie [name]  Procedure(s) Performed: Procedure(s): LEFT ANKLE ARTHRODESIS  (Left)  Patient Location: PACU  Anesthesia Type:GA combined with regional for post-op pain  Level of Consciousness: awake and alert   Airway & Oxygen Therapy: Patient Spontanous Breathing and Patient connected to nasal cannula oxygen  Post-op Assessment: Report given to PACU RN, Post -op Vital signs reviewed and stable and Patient moving all extremities  Post vital signs: Reviewed and stable  Complications: No apparent anesthesia complications

## 2014-05-10 NOTE — H&P (Signed)
Julie [name] is an 80 y.o. female.   Chief Complaint: left ankle arthritis HPI:  80 y/o female with PMH of diabetes presents today for left ankle arthrodesis.  She has a h/o left tibial pilon fracture and has developed severe post traumatic arthritis.  Hgb a1c most recently 6.8.  Past Medical History  Diagnosis Date  . Blood transfusion     no abnormal reaction noted  . Cataract   . Heart murmur   . Hyperlipidemia     but doesn't take any meds   . Interstitial cystitis   . Ulcer 1970    duodenal  . Anxiety     takes Klonopin at bedtime  . GERD (gastroesophageal reflux disease)     takes Nexium daily  . Hypertension     takes Amlodipine,Metoprolol,and Losartan daily  . Complication of anesthesia     wakes up during surgery  . History of hiatal hernia   . Cataracts, bilateral     immature  . Glaucoma   . Histoplasmosis   . Weakness     numbness in both hands and feet  . Arthritis     takes Methotrexate weekly and Prednisone  . Fibromyalgia     takes Lyrica daily  . Peripheral neuropathy   . Joint pain   . Joint swelling   . History of gout     was taking colchicine  . History of colon polyps   . Urinary frequency   . Urinary urgency   . Hard of hearing   . Nocturia   . Anemia   . Diabetes mellitus without complication     was on Metformin but has been off x 3 yrs  . Depression   . History of shingles     Past Surgical History  Procedure Laterality Date  . Upper gastrointestinal endoscopy    . Colonoscopy    . Vaginal hysterectomy    . Salpingoophorectomy Bilateral   . Cholecystectomy    . Cystocele repair    . Ankle surgery Left   . Arm surgery Left     radius  . Cervical fusion    . Dilation and curettage of uterus    . Bladder tacked     . Accupuncture  3 yrs ago  . Esophagogastroduodenoscopy      Family History  Problem Relation Age of Onset  . Colon cancer Sister   . Esophageal cancer Brother   . Breast cancer Mother    Social History:   reports that she has never smoked. She has never used smokeless tobacco. She reports that she does not drink alcohol or use illicit drugs.  Allergies:  Allergies  Allergen Reactions  . Aspirin     Duodenal ulcer  . Other     Walnuts-anaphylaxis  . [place] [Trospium Chloride] Nausea And Vomiting    Medications Prior to Admission  Medication Sig Dispense Refill  . amLODipine (NORVASC) 10 MG tablet Take 10 mg by mouth daily.    . calcium-vitamin D (OSCAL WITH D) 500-200 MG-UNIT per tablet Take 1 tablet by mouth every morning.    . clonazePAM (KLONOPIN) 0.5 MG tablet Take 0.5 mg by mouth at bedtime.    [name] esomeprazole (NEXIUM) 40 MG capsule Take 40 mg by mouth daily before breakfast.      . fish oil-omega-3 fatty acids 1000 MG capsule Take 1 g by mouth every morning.     . folic acid (FOLVITE) 1 MG tablet Take 2 mg by mouth  every morning.     [name] losartan (COZAAR) 100 MG tablet Take 100 mg by mouth daily.    . meloxicam (MOBIC) 15 MG tablet Take 15 mg by mouth at bedtime.    . Methotrexate, Anti-Rheumatic, (METHOTREXATE, PF, [place]) Inject 0.8 mLs into the skin once a week. On friday    . metoprolol succinate (TOPROL-XL) 50 MG 24 hr tablet Take 50 mg by mouth at bedtime. Take with or immediately following a meal.    . predniSONE (DELTASONE) 5 MG tablet Take 5 mg by mouth daily with breakfast.    . pregabalin (LYRICA) 75 MG capsule Take 75 mg by mouth 2 (two) times daily.    . hydrocortisone (PROCTOSOL HC) 2.5 % rectal cream Place 1 application rectally 2 (two) times daily.    . Multiple Vitamins-Minerals (MULTIVITAMIN WITH MINERALS) tablet Take 1 tablet by mouth every morning.       Results for orders placed or performed during the hospital encounter of 2014/05/13 (from the past 48 hour(s))  Glucose, capillary     Status: Abnormal   Collection Time: 2014/05/13 11:29 AM  Result Value Ref Range   Glucose-Capillary 127 (H) 70 - 99 mg/dL   No results found.  ROS  No recent f/c/nv/w/t  loss  Blood pressure 141/73, pulse 67, temperature 97 F (36.1 C), temperature source Oral, resp. rate 18, height 5\' 4"  (1.626 m), weight 103.899 kg (229 lb 0.9 oz), SpO2 99 %. Physical Exam  wn wd female in nad.  A and O x 4.  Mood and affect normal.  EOMI.  resp unlabored.  L ankle with healthy skin.  No lymphadenopathy.  5/5 strength in PF and DF of the ankle and toes.  sens to LT intact about the ankle.  2+ dp and pt pulses.  Assessment/Plan L ankle post traumatic arthritis - to OR for left ankle arthrodesis.  The risks and benefits of the alternative treatment options have been discussed in detail.  The patient wishes to proceed with surgery and specifically understands risks of bleeding, infection, nerve damage, blood clots, need for additional surgery, amputation and death.   Julie [name] 13-May-2014, 1:32 PM

## 2014-05-10 NOTE — Brief Op Note (Signed)
05/10/2014  3:59 PM  PATIENT:  Julie [name]  80 y.o. female  PRE-OPERATIVE DIAGNOSIS:  LEFT ANKLE post TRAUMATIC ARTHRITIS   POST-OPERATIVE DIAGNOSIS:  LEFT ANKLE post TRAUMATIC ARTHRITIS   Procedure(s):  LEFT ANKLE ARTHRODESIS   SURGEON:  Wylene Simmer, MD  ASSISTANT: n/a  ANESTHESIA:   General, regional  EBL:  minimal   TOURNIQUET:   Total Tourniquet Time Documented: Thigh (Right) - 78 minutes Total: Thigh (Right) - 78 minutes   COMPLICATIONS:  None apparent  DISPOSITION:  Extubated, awake and stable to recovery.  DICTATION ID:  [id number]

## 2014-05-11 LAB — GLUCOSE, CAPILLARY
GLUCOSE-CAPILLARY: 85 mg/dL (ref 70–99)
Glucose-Capillary: 128 mg/dL — ABNORMAL HIGH (ref 70–99)
Glucose-Capillary: 116 mg/dL — ABNORMAL HIGH (ref 70–99)
Glucose-Capillary: 93 mg/dL (ref 70–99)
Glucose-Capillary: 117 mg/dL — ABNORMAL HIGH (ref 70–99)

## 2014-05-11 NOTE — Op Note (Signed)
Julie [name], CHENOWETH               ACCOUNT NO.:  [id number]  MEDICAL RECORD NO.:  [id number]  LOCATION:  5N27C                        FACILITY:  [facility]  PHYSICIAN:  Wylene Simmer, MD        DATE OF BIRTH:  07-19-1932  DATE OF PROCEDURE:  05/10/2014 DATE OF DISCHARGE:                              OPERATIVE REPORT   PREOPERATIVE DIAGNOSIS:  Left ankle posttraumatic arthritis.  POSTOPERATIVE DIAGNOSIS:  Left ankle posttraumatic arthritis.  PROCEDURE:  Left ankle arthrodesis.  SURGEON:  Wylene Simmer, MD  ANESTHESIA:  General, regional.  ESTIMATED BLOOD LOSS:  Minimal.  TOURNIQUET TIME:  Seventy eight minutes at 350 mmHg.  COMPLICATIONS:  None apparent.  DISPOSITION:  Extubated, awake, and stable to recovery.  INDICATIONS FOR PROCEDURE:  The patient is a 80 year old woman with past medical history significant for diabetes and a previous tibial pilon fracture on the left.  She has developed posttraumatic arthritis and has failed nonoperative treatment.  She presents now for left ankle arthrodesis.  She understands the risks and benefits, the alternative treatment options, and elects surgical treatment.  She specifically understands risks of bleeding, infection, nerve damage, blood clots, need for additional surgery, continued pain, amputation, nonunion, and death.  PROCEDURE IN DETAIL:  After preoperative consent was obtained and the correct operative site was identified, the patient was brought to the operating room and placed supine on the operating table.  General anesthesia was induced.  Preoperative antibiotics were administered. Surgical time-out was taken.  The left lower extremity was prepped and draped in standard sterile fashion, the tourniquet around the thigh. The extremity was exsanguinated and tourniquet was inflated to 350 mmHg. A longitudinal incision was then made over the anterior aspect of the ankle.  The interval between the extensor hallucis longus and  tibialis anterior tendons was developed.  The neurovascular bundle was identified.  It was retracted laterally and protected throughout the case.  The ankle joint capsule was elevated medially and laterally exposing the joint.  There was end-stage arthritis noted with complete cartilage loss on both the tibial and talar joint surfaces.  The remaining scar tissue and fibrous tissue was excised with rongeurs and curettes.  The subchondral bone was then removed from the talar dome with a curettes and rongeurs.  This was repeated for the tibial plafond. The wound was irrigated copiously.  The articular surfaces were then perforated, multiple locations with small drill bit.  The remaining subchondral bone was broken up with 1/4-inch curved osteotome and mallet.  The joint was then packed with demineralized bone matrix and cancellous chips.  The joint was reduced and provisionally pinned.  AP and lateral radiographs confirmed appropriate reduction of the joint.  A guide pin was then placed from posterolateral just behind the fibula across the tibiotalar joint into the talar head.  A stab incision was made.  AP foot, AP ankle and lateral ankle radiographs confirmed appropriate position of this guide pin.  A 6.5 mm partially threaded cannulated screw was then inserted with a washer.  This was noted to have excellent purchase and compressed the joint appropriately.  Another guide pin was inserted from anterolateral to the talar dome.  This was  over drilled and again a 6.5 mm partially threaded screw was inserted. The initial anteromedial screw was then over drilled and a partially threaded 6.5-mm screw.  These were all 20A Tornier screws.  Final AP and lateral radiographs confirmed appropriate position and length of all hardware and appropriate compression of the tibiotalar joint.  Wound was again irrigated copiously.  The anterior joint capsule was closed with simple sutures of 2-0 Vicryl.   Extensor retinaculum was closed with simple sutures of 2-0 Vicryl.  Subcutaneous tissue was approximated with inverted simple sutures of 3-0 Vicryl and a running 3-0 Prolene was used to close the skin incisions.  Sterile dressings were applied followed by well-padded, short-leg splint.  Tourniquet was released after application of the dressings at 1 hour and 18 minutes.  The patient was awakened from anesthesia and transported to recovery room in stable condition.  FOLLOWUP PLAN:  The patient will be nonweightbearing on the left lower extremity.  She will be admitted for this inpatient only procedure.  She will have PT and OT evaluations.  She will start Lovenox for DVT prophylaxis.     Wylene Simmer, MD     JH/MEDQ  D:  05/10/2014  T:  05/11/2014  Job:  [id number]

## 2014-05-11 NOTE — Evaluation (Signed)
Physical Therapy Evaluation Patient Details Name: Julie [name] MRN: [id number] DOB: [birth year] Today's Date: 05/11/2014   History of Present Illness  Pt is a 80 y.o. female s/p Lt ankle arthrodesis.   Clinical Impression  Patient is s/p above surgery resulting in functional imitations due to the deficits listed below (see PT Problem List). Patient will benefit from skilled PT to increase their independence and safety with mobility to allow discharge to the venue listed below.  Pt mobilizing with RW and knee walker during session. Pt prefers RW for short distances and will use power wheelchair for incr distances. Pt very motivated and independent PTA. Will plan to see in morning prior to D/C home to increase mobilization. Pt will have 24/7 (A) from family.      Follow Up Recommendations No PT follow up;Supervision/Assistance - 24 hour    Equipment Recommendations  Rolling walker with 5" wheels;Other (comment);3in1 (PT)    Recommendations for Other Services OT consult     Precautions / Restrictions Precautions Precautions: None Restrictions Weight Bearing Restrictions: Yes LLE Weight Bearing: Non weight bearing      Mobility  Bed Mobility Overal bed mobility: Modified Independent             General bed mobility comments: HOB elevated  Transfers Overall transfer level: Needs assistance Equipment used: Rolling walker (2 wheeled) (knee walker) Transfers: Sit to/from Stand Sit to Stand: Min guard         General transfer comment: cues for sequencing and safety with knee walker and RW; pt more steady when transferring with RW; pt with difficulty recalling to lock brakes with knee walker ; pt demo good ability to maintain NWB status on Lt LE   Ambulation/Gait Ambulation/Gait assistance: Min guard Ambulation Distance (Feet): 50 Feet (10', 40') Assistive device: Rolling walker (2 wheeled) (knee walker) Gait Pattern/deviations: Step-to pattern Gait velocity:  decreased Gait velocity interpretation: Below normal speed for age/gender General Gait Details: pt ambulated with RW initially then knee walker; pt demo good ability to maintain NWB status on Lt Le; cues for sequencing and safety  Stairs            Wheelchair Mobility    Modified Rankin (Stroke Patients Only)       Balance Overall balance assessment: Needs assistance Sitting-balance support: No upper extremity supported;Feet supported Sitting balance-Leahy Scale: Good     Standing balance support: During functional activity;Bilateral upper extremity supported Standing balance-Leahy Scale: Poor Standing balance comment: RW and UE (A) to maintain NWB status                             Pertinent Vitals/Pain Pain Assessment: No/denies pain    Home Living Family/patient expects to be discharged to:: Private residence Living Arrangements: Children Available Help at Discharge: Family;Available 24 hours/day Type of Home: Apartment Home Access: Level entry     Home Layout: One level Home Equipment: Cane - single point;Tub bench;Wheelchair - power Additional Comments: pt lives in senior apartment; unsure if she has RW     Prior Function Level of Independence: Independent with assistive device(s)         Comments: ambulated with cane      Hand Dominance        Extremity/Trunk Assessment   Upper Extremity Assessment: Defer to OT evaluation           Lower Extremity Assessment: Overall WFL for tasks assessed (pt performing SLR bil LEs )  Cervical / Trunk Assessment: Normal  Communication   Communication: No difficulties  Cognition Arousal/Alertness: Awake/alert Behavior During Therapy: WFL for tasks assessed/performed Overall Cognitive Status: Within Functional Limits for tasks assessed                      General Comments General comments (skin integrity, edema, etc.): discussed DME needs and home safety setup with pt and  family; encouraged OOB with nursing     Exercises General Exercises - Lower Extremity Quad Sets: AROM;Strengthening;Left;10 reps (hold 5 sec count) Straight Leg Raises: AROM;Strengthening;Both;10 reps      Assessment/Plan    PT Assessment Patient needs continued PT services  PT Diagnosis Difficulty walking;Generalized weakness   PT Problem List Decreased activity tolerance;Decreased mobility;Decreased knowledge of use of DME  PT Treatment Interventions DME instruction;Gait training;Functional mobility training;Therapeutic activities;Therapeutic exercise;Balance training;Neuromuscular re-education;Patient/family education;Wheelchair mobility training   PT Goals (Current goals can be found in the Care Plan section) Acute Rehab PT Goals Patient Stated Goal: to go home tomorrow PT Goal Formulation: With patient Time For Goal Achievement: 05/13/14 Potential to Achieve Goals: Good    Frequency Min 5X/week   Barriers to discharge        Co-evaluation               End of Session Equipment Utilized During Treatment: Gait belt Activity Tolerance: Patient tolerated treatment well Patient left: in chair;with call bell/phone within reach;with family/visitor present Nurse Communication: Mobility status;Precautions;Weight bearing status         Time: 5681-2751 PT Time Calculation (min) (ACUTE ONLY): 28 min   Charges:   PT Evaluation $Initial PT Evaluation Tier I: 1 Procedure PT Treatments $Gait Training: 23-37 mins   PT G CodesGustavus Bryant, [place] 05/11/2014, 10:22 AM

## 2014-05-11 NOTE — Evaluation (Signed)
Occupational Therapy Evaluation and Discharge Patient Details Name: Julie [name] MRN: [id number] DOB: [birth year] Today's Date: 05/11/2014    History of Present Illness Pt is a 80 y.o. female s/p Lt ankle arthrodesis.    Clinical Impression   This 80 yo female admitted and underwent above presents to acute OT with all education completed, we will D/C from acute OT.    Follow Up Recommendations  No OT follow up    Equipment Recommendations  3 in 1 bedside comode;Wheelchair (measurements OT);Wheelchair cushion (measurements OT) (RW)       Precautions / Restrictions Precautions Precautions: Fall Restrictions Weight Bearing Restrictions: Yes LLE Weight Bearing: Non weight bearing      Mobility Bed Mobility Overal bed mobility: Modified Independent             General bed mobility comments: HOB elevated  Transfers Overall transfer level: Needs assistance   Transfers: Stand Pivot Transfers;Squat Pivot Transfers   Stand pivot transfers: Min guard Squat pivot transfers: Min guard               ADL Overall ADL's : Needs assistance/impaired Eating/Feeding: Independent;Sitting   Grooming: Set up;Sitting   Upper Body Bathing: Set up;Sitting   Lower Body Bathing: Min guard;Sit to/from stand   Upper Body Dressing : Set up;Sitting   Lower Body Dressing: Min guard;Sit to/from stand   Toilet Transfer: Min guard;Squat-pivot (bed<>recliner)   Toileting- Water quality scientist and Hygiene: Min guard;Sit to/from Nurse, children's Details (indicate cue type and reason): Took pt to gym to show them tub with tub transfer to tub bench, they verbalized understanding once I demonstrated. I made them aware that the leg needs to stay dry and they could either double bag/tape above cast or they could get a short leg cast cover from [facility]                   Pertinent Vitals/Pain Pain Assessment: No/denies pain     Hand Dominance Right    Extremity/Trunk Assessment Upper Extremity Assessment Upper Extremity Assessment: Overall WFL for tasks assessed           Communication Communication Communication: No difficulties   Cognition Arousal/Alertness: Awake/alert Behavior During Therapy: WFL for tasks assessed/performed Overall Cognitive Status: Within Functional Limits for tasks assessed                                Home Living Family/patient expects to be discharged to:: Private residence Living Arrangements: Children Available Help at Discharge: Family;Available 24 hours/day Type of Home: Apartment Home Access: Level entry     Home Layout: One level     Bathroom Shower/Tub: Tub/shower unit;Curtain Shower/tub characteristics: Architectural technologist: Standard     Home Equipment: Cane - single point;Tub bench;Wheelchair - power   Additional Comments: pt lives in senior apartment; unsure if she has RW       Prior Functioning/Environment Level of Independence: Independent with assistive device(s)        Comments: ambulated with cane     OT Diagnosis: Generalized weakness         OT Goals(Current goals can be found in the care plan section) Acute Rehab OT Goals Patient Stated Goal: to go home tomorrow  OT Frequency:                End of Session Equipment Utilized During Treatment: Surveyor, mining Communication:  (CM: pt  needs W/C with elevating leg rests, RW ,and 3n1)  Activity Tolerance: Patient tolerated treatment well Patient left: in chair;with call bell/phone within reach;with family/visitor present   Time: 0375-4360 OT Time Calculation (min): 33 min Charges:  OT General Charges $OT Visit: 1 Procedure OT Evaluation $Initial OT Evaluation Tier I: 1 Procedure OT Treatments $Self Care/Home Management : 23-37 mins  Almon Register [phone] 05/11/2014, 4:43 PM

## 2014-05-11 NOTE — Progress Notes (Signed)
Utilization review completed.  

## 2014-05-11 NOTE — Progress Notes (Signed)
Subjective: 1 Day Post-Op Procedure(s) (LRB): LEFT ANKLE ARTHRODESIS  (Left) Patient reports pain as mild.  Well controlled with oral pain meds.  Daughters at bedside.  No c/o this morning.  Objective: Vital signs in last 24 hours: Temp:  [96.7 F (35.9 C)-99.1 F (37.3 C)] 99.1 F (37.3 C) (12/18 0516) Pulse Rate:  [61-72] 68 (12/18 0516) Resp:  [8-20] 16 (12/18 0516) BP: (118-141)/(54-73) 126/62 mmHg (12/18 0516) SpO2:  [97 %-100 %] 97 % (12/18 0516) Weight:  [103.899 kg (229 lb 0.9 oz)] 103.899 kg (229 lb 0.9 oz) (12/17 1134)  Intake/Output from previous day: 12/17 0701 - 12/18 0700 In: 190 [P.O.:40; I.V.:150] Out: 300 [Urine:300] Intake/Output this shift:     Recent Labs  05/10/14 1917  HGB 11.6*    Recent Labs  05/10/14 1917  WBC 9.4  RBC 4.18  HCT 36.9  PLT 224    Recent Labs  05/10/14 1917  CREATININE 0.85   No results for input(s): LABPT, INR in the last 72 hours.  WN WD woman in nad.  L LE splinted.  Brisk cap refill at toes.  Sens to LT intact.  Assessment/Plan: 1 Day Post-Op Procedure(s) (LRB): LEFT ANKLE ARTHRODESIS  (Left) Up with PT today.  Pt may need snf for short term rehab depending on how she does with PT.  Julie [name] 05/11/2014, 8:33 AM

## 2014-05-12 LAB — GLUCOSE, CAPILLARY
Glucose-Capillary: 101 mg/dL — ABNORMAL HIGH (ref 70–99)
Glucose-Capillary: 113 mg/dL — ABNORMAL HIGH (ref 70–99)
Glucose-Capillary: 125 mg/dL — ABNORMAL HIGH (ref 70–99)
Glucose-Capillary: 108 mg/dL — ABNORMAL HIGH (ref 70–99)

## 2014-05-12 NOTE — Progress Notes (Signed)
Subjective: 2 Days Post-Op Procedure(s) (LRB): LEFT ANKLE ARTHRODESIS  (Left) Patient reports pain as 4 on 0-10 scale.  Seems comfortable with family in room.  Objective: Vital signs in last 24 hours: Temp:  [99.3 F (37.4 C)-99.6 F (37.6 C)] 99.3 F (37.4 C) (12/19 0600) Pulse Rate:  [65-79] 65 (12/19 0946) Resp:  [16] 16 (12/19 0600) BP: (97-125)/(50-58) 97/50 mmHg (12/19 0946) SpO2:  [92 %-93 %] 92 % (12/19 0600)  Intake/Output from previous day: 12/18 0701 - 12/19 0700 In: 840 [P.O.:840] Out: -  Intake/Output this shift:     Recent Labs  05/10/14 1917  HGB 11.6*    Recent Labs  05/10/14 1917  WBC 9.4  RBC 4.18  HCT 36.9  PLT 224    Recent Labs  05/10/14 1917  CREATININE 0.85   No results for input(s): LABPT, INR in the last 72 hours.  Sensation intact distally  Good capillary refill. Assessment/Plan: 2 Days Post-Op Procedure(s) (LRB): LEFT ANKLE ARTHRODESIS  (Left)  Doing well  Continue current care.talked with patient and family. Up with therapy  Alysiah Suppa ANDREW 05/12/2014, 10:11 AM

## 2014-05-12 NOTE — Progress Notes (Signed)
Physical Therapy Treatment Patient Details Name: KSENIYA GRUNDEN MRN: [id number] DOB: [birth year] Today's Date: 05/12/2014    History of Present Illness Pt is a 80 y.o. female s/p Lt ankle arthrodesis.     PT Comments    Pt mobilizing well. Session reviewed wheelchair management and safety as well as HEP. Will cont to follow per POC till D/C home with family.   Follow Up Recommendations  No PT follow up;Supervision/Assistance - 24 hour     Equipment Recommendations  Rolling walker with 5" wheels;Other (comment);3in1 (PT)    Recommendations for Other Services       Precautions / Restrictions Precautions Precautions: None Restrictions Weight Bearing Restrictions: Yes LLE Weight Bearing: Non weight bearing    Mobility  Bed Mobility Overal bed mobility: Modified Independent             General bed mobility comments: HOB elevated  Transfers Overall transfer level: Needs assistance Equipment used: Rolling walker (2 wheeled) Transfers: Sit to/from Stand Sit to Stand: Supervision         General transfer comment: cues for hand placement and safety with transfers; edcuated pt on wheelchair brake management when transferring from w/c   Ambulation/Gait Ambulation/Gait assistance: Min guard Ambulation Distance (Feet): 8 Feet Assistive device: Rolling walker (2 wheeled) Gait Pattern/deviations: Step-to pattern Gait velocity: decreased Gait velocity interpretation: Below normal speed for age/gender General Gait Details: cues for safety and technique with RW    Hotel manager mobility: Yes Wheelchair propulsion: Both upper extremities Wheelchair parts: Supervision/cueing Distance: 300 Wheelchair Assistance Details (indicate cue type and reason): cues for brake management and navigating obstacles   Modified Rankin (Stroke Patients Only)       Balance Overall balance assessment: Needs  assistance;History of Falls Sitting-balance support: Feet supported;No upper extremity supported Sitting balance-Leahy Scale: Good     Standing balance support: During functional activity;Bilateral upper extremity supported Standing balance-Leahy Scale: Poor Standing balance comment: RW to balance                     Cognition Arousal/Alertness: Awake/alert Behavior During Therapy: WFL for tasks assessed/performed Overall Cognitive Status: Within Functional Limits for tasks assessed                      Exercises General Exercises - Lower Extremity Long Arc Quad: AROM;Strengthening;Left;10 reps;Seated Heel Slides: AROM;Strengthening;Left;10 reps;Seated Straight Leg Raises: AROM;Strengthening;Both;10 reps    General Comments General comments (skin integrity, edema, etc.): encouraged OOB with nursing, not independently       Pertinent Vitals/Pain Pain Assessment: 0-10 Pain Score: 8  Pain Location: Lt ankle Pain Descriptors / Indicators:  ("hurting") Pain Intervention(s): Repositioned;Premedicated before session;Monitored during session    Home Living                      Prior Function            PT Goals (current goals can now be found in the care plan section) Acute Rehab PT Goals Patient Stated Goal: to go home PT Goal Formulation: With patient Time For Goal Achievement: 05/13/14 Potential to Achieve Goals: Good Progress towards PT goals: Progressing toward goals    Frequency  Min 5X/week    PT Plan Current plan remains appropriate    Co-evaluation             End of Session   Activity  Tolerance: Patient tolerated treatment well Patient left: in chair;with call bell/phone within reach     Time: 0838-0904 PT Time Calculation (min) (ACUTE ONLY): 26 min  Charges:  $Therapeutic Exercise: 8-22 mins $Wheel Chair Management: 8-22 mins                    G Codes:      Gustavus Bryant, [place]  [phone] 05/12/2014, 9:54 AM

## 2014-05-13 LAB — GLUCOSE, CAPILLARY
GLUCOSE-CAPILLARY: 88 mg/dL (ref 70–99)
GLUCOSE-CAPILLARY: 89 mg/dL (ref 70–99)
Glucose-Capillary: 109 mg/dL — ABNORMAL HIGH (ref 70–99)
Glucose-Capillary: 75 mg/dL (ref 70–99)

## 2014-05-13 NOTE — Progress Notes (Signed)
CSW (Clinical Education officer, museum) aware of consult. At this time, PT is recommending no follow up. Pt has no social work needs. Please reconsult should needs arise.  Julie [name] Jenny Reichmann Weekend CSW [phone]

## 2014-05-13 NOTE — Progress Notes (Signed)
Subjective: 3 Days Post-Op Procedure(s) (LRB): LEFT ANKLE ARTHRODESIS  (Left) Patient reports pain as moderate.   Reports some burning type pain medial ankle this AM. Seen in rounds with Dr. Gladstone Lighter. No numbness. No other c/o.  Objective: Vital signs in last 24 hours: Temp:  [97.5 F (36.4 C)-99.8 F (37.7 C)] 99.8 F (37.7 C) (12/20 0620) Pulse Rate:  [65-79] 75 (12/20 0620) Resp:  [14-16] 16 (12/20 0620) BP: (97-125)/(50-65) 125/65 mmHg (12/20 0620) SpO2:  [95 %-96 %] 95 % (12/20 0620)  Intake/Output from previous day: 12/19 0701 - 12/20 0700 In: 720 [P.O.:720] Out: -  Intake/Output this shift:     Recent Labs  05/10/14 1917  HGB 11.6*    Recent Labs  05/10/14 1917  WBC 9.4  RBC 4.18  HCT 36.9  PLT 224    Recent Labs  05/10/14 1917  CREATININE 0.85   No results for input(s): LABPT, INR in the last 72 hours.  Neurologically intact ABD soft Neurovascular intact Sensation intact distally Intact pulses distally Dorsiflexion/Plantar flexion intact Incision: dressing C/D/I and no drainage No cellulitis present Compartment soft no sign of DVT  Coban dressing in place clean and dry  Assessment/Plan: 3 Days Post-Op Procedure(s) (LRB): LEFT ANKLE ARTHRODESIS  (Left) Advance diet Up with therapy D/C IV fluids  Ice and elevation Likely D/C tomorrow - pt lives alone may require SNF  Hadar Elgersma M. 05/13/2014, 7:45 AM

## 2014-05-14 LAB — GLUCOSE, CAPILLARY
Glucose-Capillary: 99 mg/dL (ref 70–99)
Glucose-Capillary: 112 mg/dL — ABNORMAL HIGH (ref 70–99)

## 2014-05-14 MED ORDER — DSS 100 MG PO CAPS: 1.0000 mg | ORAL_CAPSULE | Freq: Two times a day (BID) | ORAL | Status: DC

## 2014-05-14 MED ORDER — OXYCODONE HCL 5 MG PO TABS: 5.0000 mg | ORAL_TABLET | ORAL | Status: DC | PRN

## 2014-05-14 MED ORDER — ENOXAPARIN SODIUM 40 MG/0.4ML ~~LOC~~ SOLN: 40.0000 mg | SUBCUTANEOUS | Status: DC

## 2014-05-14 MED ORDER — ACETAMINOPHEN 325 MG PO TABS: 650.0000 mg | ORAL_TABLET | Freq: Four times a day (QID) | ORAL | Status: DC | PRN

## 2014-05-14 MED ORDER — SENNA 8.6 MG PO TABS: 2.0000 | ORAL_TABLET | Freq: Two times a day (BID) | ORAL | Status: DC

## 2014-05-14 NOTE — Discharge Instructions (Signed)
Wylene Simmer, MD [place]  Please read the following information regarding your care after surgery.  Medications  You only need a prescription for the narcotic pain medicine (ex. oxycodone, Percocet, Norco).  All of the other medicines listed below are available over the counter. X acetominophen (Tylenol) 650 mg every 4-6 hours as you need for minor pain X oxycodone as prescribed for moderate to severe pain   Narcotic pain medicine (ex. oxycodone, Percocet, Vicodin) will cause constipation.  To prevent this problem, take the following medicines while you are taking any pain medicine. X docusate sodium (Colace) 100 mg twice a day X senna (Senokot) 2 tablets twice a day  X To help prevent blood clots, take Lovenox once a day for two weeks after surgery.  You should also get up every hour while you are awake to move around.    Weight Bearing X Do not bear any weight on the operated leg or foot.  Cast / Splint / Dressing X Keep your splint or cast clean and dry.  Dont put anything (coat hanger, pencil, etc) down inside of it.  If it gets damp, use a hair dryer on the cool setting to dry it.  If it gets soaked, call the office to schedule an appointment for a cast change.  After your dressing, cast or splint is removed; you may shower, but do not soak or scrub the wound.  Allow the water to run over it, and then gently pat it dry.  Swelling It is normal for you to have swelling where you had surgery.  To reduce swelling and pain, keep your toes above your nose for at least 3 days after surgery.  It may be necessary to keep your foot or leg elevated for several weeks.  If it hurts, it should be elevated.  Follow Up Call my office at [phone] when you are discharged from the hospital or surgery center to schedule an appointment to be seen two weeks after surgery.  Call my office at [phone] if you develop a fever >101.5 F, nausea, vomiting, bleeding from the surgical  site or severe pain.

## 2014-05-14 NOTE — Progress Notes (Signed)
Physical Therapy Treatment Patient Details Name: Julie [name] MRN: [id number] DOB: 07-01-1932 Today's Date: 05/14/2014    History of Present Illness Pt is a 80 y.o. female s/p Lt ankle arthrodesis.     PT Comments    Pt ambulating with RW to/from bathroom consistently without difficulty. Pt demo's good understanding of how to use w/c and BSC. Discussed car txfers with dtr.  Pt safe to d/c home with dtr once medically stable.  Follow Up Recommendations  No PT follow up;Supervision/Assistance - 24 hour     Equipment Recommendations  Rolling walker with 5" wheels;Other (comment);3in1 (PT)    Recommendations for Other Services       Precautions / Restrictions Precautions Precautions: Fall Restrictions Weight Bearing Restrictions: Yes LLE Weight Bearing: Non weight bearing    Mobility  Bed Mobility                  Transfers                    Ambulation/Gait             General Gait Details: deferred ambulation due to just amb to/from bathroom and desired to learn how to administer lovenox shots from Fish farm manager Details (indicate cue type and reason): pt and daughter able to return demonstrate proper w/c management  Modified Rankin (Stroke Patients Only)       Balance                                    Cognition Arousal/Alertness: Awake/alert Behavior During Therapy: WFL for tasks assessed/performed Overall Cognitive Status: Within Functional Limits for tasks assessed                      Exercises      General Comments General comments (skin integrity, edema, etc.): instructed patient and dtr on how to manage [facility] and measure. discussed transfers in/out of car and options for comfort. Elevating L LE in car. Completed simulation of car transfer      Pertinent Vitals/Pain Pain Assessment: 0-10 Pain Score: 3  Pain Location: lt  ankle Pain Intervention(s):  (kept elevated)    Home Living                      Prior Function            PT Goals (current goals can now be found in the care plan section) Acute Rehab PT Goals PT Goal Formulation: With patient Time For Goal Achievement: 05/21/14 Potential to Achieve Goals: Good Progress towards PT goals: Progressing toward goals    Frequency  Min 3X/week    PT Plan Frequency needs to be updated    Co-evaluation             End of Session           Time: 0750-0810 PT Time Calculation (min) (ACUTE ONLY): 20 min  Charges:  $Therapeutic Activity: 8-22 mins                    G Codes:      Kingsley Callander 05/14/2014, 9:04 AM   Kittie Plater, PT, DPT Pager #: [phone] Office #: [phone]

## 2014-05-14 NOTE — Discharge Summary (Signed)
Physician Discharge Summary  Patient ID: Julie [name] MRN: [id number] DOB/AGE: 01/29/1933 80 y.o.  Admit date: 05/10/2014 Discharge date: 05/14/2014  Admission Diagnoses:  Diabetes, RA, htn, left ankle arthritis  Discharge Diagnoses:  Active Problems:   Post-traumatic arthritis of ankle same as above S/p left ankle arthrodesis  Discharged Condition: stable  Hospital Course:  The patient was admitted and taken to the OR where she underwent left ankle arthrodesis.  She tolerated the surgery well and was transferred to 5N where she remained for her hospital stay.  She did well with PT and OT and is discharged to home in stable condition.  SHe is NWB on the L LE.    Consults: PT, OT  Significant Diagnostic Studies: none  Treatments: surgery: as above  Discharge Exam: Blood pressure 135/66, pulse 73, temperature 99.4 F (37.4 C), temperature source Oral, resp. rate 16, height 5\' 4"  (1.626 m), weight 103.899 kg (229 lb 0.9 oz), SpO2 98 %. wn wd woman in nad.  A and O x 4.  mood and affect normal.  EOMI.  resp unlabored.  L ankle immobilized ina  SLS.  NVI at toes.  Disposition: 01-Home or Self Care  Discharge Instructions    Call MD / Call 911    Complete by:  As directed   If you experience chest pain or shortness of breath, CALL 911 and be transported to the hospital emergency room.  If you develope a fever above 101 F, pus (white drainage) or increased drainage or redness at the wound, or calf pain, call your surgeon's office.     Constipation Prevention    Complete by:  As directed   Drink plenty of fluids.  Prune juice may be helpful.  You may use a stool softener, such as Colace (over the counter) 100 mg twice a day.  Use MiraLax (over the counter) for constipation as needed.     Diet - low sodium heart healthy    Complete by:  As directed      Increase activity slowly as tolerated    Complete by:  As directed      Non weight bearing    Complete by:  As directed   Laterality:  left  Extremity:  Lower            Medication List    STOP taking these medications        meloxicam 15 MG tablet  Commonly known as:  MOBIC      TAKE these medications        acetaminophen 325 MG tablet  Commonly known as:  TYLENOL  Take 2 tablets (650 mg total) by mouth every 6 (six) hours as needed for mild pain or moderate pain.     amLODipine 10 MG tablet  Commonly known as:  NORVASC  Take 10 mg by mouth daily.     calcium-vitamin D 500-200 MG-UNIT per tablet  Commonly known as:  OSCAL WITH D  Take 1 tablet by mouth every morning.     clonazePAM 0.5 MG tablet  Commonly known as:  KLONOPIN  Take 0.5 mg by mouth at bedtime.     DSS 100 MG Caps  Take 1 mg by mouth 2 (two) times daily.     enoxaparin 40 MG/0.4ML injection  Commonly known as:  LOVENOX  Inject 0.4 mLs (40 mg total) into the skin daily.  Start taking on:  05/15/2014     esomeprazole 40 MG capsule  Commonly known as:  NEXIUM  Take 40 mg by mouth daily before breakfast.     fish oil-omega-3 fatty acids 1000 MG capsule  Take 1 g by mouth every morning.     folic acid 1 MG tablet  Commonly known as:  FOLVITE  Take 2 mg by mouth every morning.     losartan 100 MG tablet  Commonly known as:  COZAAR  Take 100 mg by mouth daily.     METHOTREXATE (PF) [place]  Inject 0.8 mLs into the skin once a week. On friday     metoprolol succinate 50 MG 24 hr tablet  Commonly known as:  TOPROL-XL  Take 50 mg by mouth at bedtime. Take with or immediately following a meal.     multivitamin with minerals tablet  Take 1 tablet by mouth every morning.     oxyCODONE 5 MG immediate release tablet  Commonly known as:  Oxy IR/ROXICODONE  Take 1 tablet (5 mg total) by mouth every 3 (three) hours as needed for moderate pain or severe pain.     predniSONE 5 MG tablet  Commonly known as:  DELTASONE  Take 5 mg by mouth daily with breakfast.     pregabalin 75 MG capsule  Commonly known as:  LYRICA  Take  75 mg by mouth 2 (two) times daily.     PROCTOSOL HC 2.5 % rectal cream  Generic drug:  hydrocortisone  Place 1 application rectally 2 (two) times daily.     senna 8.6 MG Tabs tablet  Commonly known as:  SENOKOT  Take 2 tablets (17.2 mg total) by mouth 2 (two) times daily.           Follow-up Information    Follow up with Wylene Simmer, MD In 2 weeks.   Specialty:  Orthopedic Surgery   Contact information:   [address] [place] [address] [phone]       Signed: Wylene Simmer 05/14/2014, 7:11 AM

## 2014-05-14 NOTE — Progress Notes (Signed)
CARE MANAGEMENT NOTE 05/14/2014  Patient:  Julie [name], Julie [name]   Account Number:  [id number]  Date Initiated:  05/14/2014  Documentation initiated by:  [facility]  Subjective/Objective Assessment:   s/p left ankle arthrodesis     Action/Plan:   Pt/Ot evals-recommended wheelchair, rolling walker and 3N1   Anticipated DC Date:  05/14/2014   Anticipated DC Plan:  [place]  CM consult      PAC Choice  DURABLE MEDICAL EQUIPMENT   Choice offered to / List presented to:     DME arranged  3-N-1  [place]      DME agency  [place].        Status of service:  Completed, signed off Medicare Important Message given?  YES (If response is "NO", the following Medicare IM given date fields will be blank) Date Medicare IM given:  05/14/2014 Medicare IM given by:  [facility] Date Additional Medicare IM given:   Additional Medicare IM given by:    Discharge Disposition:  HOME/SELF CARE  Per UR Regulation:  Reviewed for med. necessity/level of care/duration of stay

## 2014-05-14 NOTE — Progress Notes (Signed)
Patient D/C in stable condition with scripts,DME. Demonstrates Lovenox self-injection without difficulty, education done. D/C via w/c.

## 2014-05-15 ENCOUNTER — Encounter (HOSPITAL_COMMUNITY): Payer: Self-pay | Admitting: Orthopedic Surgery

## 2014-05-30 DIAGNOSIS — M12072 Chronic postrheumatic arthropathy [Jaccoud], left ankle and foot: Secondary | ICD-10-CM | POA: Diagnosis not present

## 2014-05-30 DIAGNOSIS — Z4789 Encounter for other orthopedic aftercare: Secondary | ICD-10-CM | POA: Diagnosis not present

## 2014-06-21 DIAGNOSIS — M0579 Rheumatoid arthritis with rheumatoid factor of multiple sites without organ or systems involvement: Secondary | ICD-10-CM | POA: Diagnosis not present

## 2014-06-21 DIAGNOSIS — M25561 Pain in right knee: Secondary | ICD-10-CM | POA: Diagnosis not present

## 2014-06-27 DIAGNOSIS — Z4789 Encounter for other orthopedic aftercare: Secondary | ICD-10-CM | POA: Diagnosis not present

## 2014-06-27 DIAGNOSIS — M19071 Primary osteoarthritis, right ankle and foot: Secondary | ICD-10-CM | POA: Diagnosis not present

## 2014-06-27 DIAGNOSIS — M19072 Primary osteoarthritis, left ankle and foot: Secondary | ICD-10-CM | POA: Diagnosis not present

## 2014-07-25 DIAGNOSIS — Z4789 Encounter for other orthopedic aftercare: Secondary | ICD-10-CM | POA: Diagnosis not present

## 2014-07-25 DIAGNOSIS — M19072 Primary osteoarthritis, left ankle and foot: Secondary | ICD-10-CM | POA: Diagnosis not present

## 2014-08-13 DIAGNOSIS — I10 Essential (primary) hypertension: Secondary | ICD-10-CM | POA: Diagnosis not present

## 2014-08-13 DIAGNOSIS — M25572 Pain in left ankle and joints of left foot: Secondary | ICD-10-CM | POA: Diagnosis not present

## 2014-08-13 DIAGNOSIS — E119 Type 2 diabetes mellitus without complications: Secondary | ICD-10-CM | POA: Diagnosis not present

## 2014-08-13 DIAGNOSIS — M0579 Rheumatoid arthritis with rheumatoid factor of multiple sites without organ or systems involvement: Secondary | ICD-10-CM | POA: Diagnosis not present

## 2014-08-23 DIAGNOSIS — M25572 Pain in left ankle and joints of left foot: Secondary | ICD-10-CM | POA: Diagnosis not present

## 2014-08-29 DIAGNOSIS — E785 Hyperlipidemia, unspecified: Secondary | ICD-10-CM | POA: Diagnosis not present

## 2014-08-29 DIAGNOSIS — Z23 Encounter for immunization: Secondary | ICD-10-CM | POA: Diagnosis not present

## 2014-08-29 DIAGNOSIS — E119 Type 2 diabetes mellitus without complications: Secondary | ICD-10-CM | POA: Diagnosis not present

## 2014-08-29 DIAGNOSIS — I10 Essential (primary) hypertension: Secondary | ICD-10-CM | POA: Diagnosis not present

## 2014-08-29 DIAGNOSIS — K219 Gastro-esophageal reflux disease without esophagitis: Secondary | ICD-10-CM | POA: Diagnosis not present

## 2014-08-29 DIAGNOSIS — Z981 Arthrodesis status: Secondary | ICD-10-CM | POA: Diagnosis not present

## 2014-08-29 DIAGNOSIS — M25572 Pain in left ankle and joints of left foot: Secondary | ICD-10-CM | POA: Diagnosis not present

## 2014-09-05 ENCOUNTER — Other Ambulatory Visit: Payer: Self-pay

## 2014-09-05 DIAGNOSIS — Z1231 Encounter for screening mammogram for malignant neoplasm of breast: Secondary | ICD-10-CM

## 2014-09-10 DIAGNOSIS — M25572 Pain in left ankle and joints of left foot: Secondary | ICD-10-CM | POA: Diagnosis not present

## 2014-09-13 ENCOUNTER — Ambulatory Visit
Admission: RE | Admit: 2014-09-13 | Discharge: 2014-09-13 | Disposition: A | Discharge status: Home / Self Care | Payer: Medicare Other | Source: Ambulatory Visit

## 2014-09-13 DIAGNOSIS — Z1231 Encounter for screening mammogram for malignant neoplasm of breast: Secondary | ICD-10-CM | POA: Diagnosis not present

## 2014-10-03 DIAGNOSIS — M1711 Unilateral primary osteoarthritis, right knee: Secondary | ICD-10-CM | POA: Diagnosis not present

## 2014-10-17 ENCOUNTER — Other Ambulatory Visit (HOSPITAL_BASED_OUTPATIENT_CLINIC_OR_DEPARTMENT_OTHER): Payer: Medicare Other

## 2014-10-17 DIAGNOSIS — D649 Anemia, unspecified: Secondary | ICD-10-CM | POA: Diagnosis not present

## 2014-10-17 DIAGNOSIS — D472 Monoclonal gammopathy: Secondary | ICD-10-CM

## 2014-10-17 LAB — COMPREHENSIVE METABOLIC PANEL (CC13)
ALBUMIN: 3.6 g/dL (ref 3.5–5.0)
ALT: 11 U/L (ref 0–55)
ANION GAP: 11 meq/L (ref 3–11)
AST: 20 U/L (ref 5–34)
Alkaline Phosphatase: 80 U/L (ref 40–150)
BILIRUBIN TOTAL: 0.61 mg/dL (ref 0.20–1.20)
BUN: 22.9 mg/dL (ref 7.0–26.0)
CO2: 27 mEq/L (ref 22–29)
Calcium: 10.1 mg/dL (ref 8.4–10.4)
Chloride: 105 mEq/L (ref 98–109)
Creatinine: 1.1 mg/dL (ref 0.6–1.1)
EGFR: 57 mL/min/{1.73_m2} — ABNORMAL LOW (ref >90)
Glucose: 101 mg/dl (ref 70–140)
Potassium: 4.2 mEq/L (ref 3.5–5.1)
Sodium: 143 mEq/L (ref 136–145)
Total Protein: 7 g/dL (ref 6.4–8.3)

## 2014-10-17 LAB — CBC WITH DIFFERENTIAL/PLATELET
BASO%: 0.6 % (ref 0.0–2.0)
Basophils Absolute: 0.1 10*3/uL (ref 0.0–0.1)
EOS%: 1.8 % (ref 0.0–7.0)
Eosinophils Absolute: 0.2 10*3/uL (ref 0.0–0.5)
HCT: 36.3 % (ref 34.8–46.6)
HGB: 11.8 g/dL (ref 11.6–15.9)
LYMPH%: 10.5 % — ABNORMAL LOW (ref 14.0–49.7)
MCH: 27.8 pg (ref 25.1–34.0)
MCHC: 32.5 g/dL (ref 31.5–36.0)
MCV: 85.3 fL (ref 79.5–101.0)
MONO#: 0.5 10*3/uL (ref 0.1–0.9)
MONO%: 5.6 % (ref 0.0–14.0)
NEUT%: 81.5 % — AB (ref 38.4–76.8)
NEUTROS ABS: 7.5 10*3/uL — AB (ref 1.5–6.5)
Platelets: 230 10*3/uL (ref 145–400)
RBC: 4.25 10*6/uL (ref 3.70–5.45)
RDW: 15.8 % — ABNORMAL HIGH (ref 11.2–14.5)
WBC: 9.2 10*3/uL (ref 3.9–10.3)
lymph#: 1 10*3/uL (ref 0.9–3.3)

## 2014-10-17 LAB — LACTATE DEHYDROGENASE (CC13): LDH: 278 U/L — AB (ref 125–245)

## 2014-10-19 LAB — IGG, IGA, IGM
IGG (IMMUNOGLOBIN G), SERUM: 779 mg/dL (ref 690–1700)
IGM, SERUM: 42 mg/dL — AB (ref 52–322)
IgA: 690 mg/dL — ABNORMAL HIGH (ref 69–380)

## 2014-10-19 LAB — KAPPA/LAMBDA LIGHT CHAINS
KAPPA LAMBDA RATIO: 3.73 — AB (ref 0.26–1.65)
Kappa free light chain: 4.4 mg/dL — ABNORMAL HIGH (ref 0.33–1.94)
Lambda Free Lght Chn: 1.18 mg/dL (ref 0.57–2.63)

## 2014-10-19 LAB — BETA 2 MICROGLOBULIN, SERUM: BETA 2 MICROGLOBULIN: 2.49 mg/L (ref <=2.51)

## 2014-10-24 ENCOUNTER — Ambulatory Visit (HOSPITAL_BASED_OUTPATIENT_CLINIC_OR_DEPARTMENT_OTHER): Payer: Medicare Other | Admitting: Internal Medicine

## 2014-10-24 ENCOUNTER — Encounter: Payer: Self-pay | Admitting: Internal Medicine

## 2014-10-24 ENCOUNTER — Telehealth: Payer: Self-pay | Admitting: Internal Medicine

## 2014-10-24 VITALS — BP 155/71 | HR 69 | Temp 98.2°F | Resp 17 | Ht 64.0 in | Wt 228.9 lb

## 2014-10-24 DIAGNOSIS — D472 Monoclonal gammopathy: Secondary | ICD-10-CM

## 2014-10-24 NOTE — Telephone Encounter (Signed)
Gave and printed appt sched for June and July 2017

## 2014-10-24 NOTE — Progress Notes (Signed)
[place] [phone]   Fax:([phone]) [place], MD [address], [address] [place] [address]  PRINCIPAL DIAGNOSIS: Monoclonal gammopathy of undetermined significance (MGUS).    CURRENT THERAPY: Observation.   INTERVAL HISTORY:  Julie [name] 81 y.o. female returns to the clinic today for routine six-month followup visit. The patient is feeling fine today with no specific complaints except for arthralgia. Her primary care physician moved to [place] and she is looking for another primary care physician at this point. She is also under the care of Dr. Ouida Sills for treatment of rheumatoid arthritis and fibromyalgia, and treated with methotrexate, prednisone and Vicodin but he is also moving out of town. She denied having any significant chest pain, shortness of breath, cough or hemoptysis. She denied having any significant weight loss or night sweats. No bleeding issues. The patient has repeat CBC, comprehensive metabolic panel, LDH and myeloma panel performed recently and she is here for evaluation and discussion of her lab results.   MEDICAL HISTORY: Past Medical History  Diagnosis Date  . Blood transfusion     no abnormal reaction noted  . Cataract   . Heart murmur   . Hyperlipidemia     but doesn't take any meds   . Interstitial cystitis   . Ulcer 1970    duodenal  . Anxiety     takes Klonopin at bedtime  . GERD (gastroesophageal reflux disease)     takes Nexium daily  . Hypertension     takes Amlodipine,Metoprolol,and Losartan daily  . Complication of anesthesia     wakes up during surgery  . History of hiatal hernia   . Cataracts, bilateral     immature  . Glaucoma   . Histoplasmosis   . Weakness     numbness in both hands and feet  . Arthritis     takes Methotrexate weekly and Prednisone  . Fibromyalgia     takes Lyrica daily  . Peripheral neuropathy   . Joint pain   . Joint swelling     . History of gout     was taking colchicine  . History of colon polyps   . Urinary frequency   . Urinary urgency   . Hard of hearing   . Nocturia   . Anemia   . Diabetes mellitus without complication     was on Metformin but has been off x 3 yrs  . Depression   . History of shingles     ALLERGIES:  is allergic to aspirin; other; and sanctura.  MEDICATIONS:  Current Outpatient Prescriptions  Medication Sig Dispense Refill  . acetaminophen (TYLENOL) 325 MG tablet Take 2 tablets (650 mg total) by mouth every 6 (six) hours as needed for mild pain or moderate pain. 50 tablet 0  . amLODipine (NORVASC) 10 MG tablet Take 10 mg by mouth daily.    . calcium-vitamin D (OSCAL WITH D) 500-200 MG-UNIT per tablet Take 1 tablet by mouth every morning.    . clonazePAM (KLONOPIN) 0.5 MG tablet Take 0.5 mg by mouth at bedtime.    [name] esomeprazole (NEXIUM) 40 MG capsule Take 40 mg by mouth daily before breakfast.      . fish oil-omega-3 fatty acids 1000 MG capsule Take 1 g by mouth every morning.     . folic acid (FOLVITE) 1 MG tablet Take 2 mg by mouth every morning.     [name] losartan (COZAAR) 100 MG tablet  Take 100 mg by mouth daily.    . Methotrexate, Anti-Rheumatic, (METHOTREXATE, PF, [place]) Inject 0.8 mLs into the skin once a week. On friday    . metoprolol succinate (TOPROL-XL) 50 MG 24 hr tablet Take 50 mg by mouth at bedtime. Take with or immediately following a meal.    . predniSONE (DELTASONE) 5 MG tablet Take 5 mg by mouth daily with breakfast.    . pregabalin (LYRICA) 75 MG capsule Take 75 mg by mouth 2 (two) times daily.    [name] oxyCODONE (OXY IR/ROXICODONE) 5 MG immediate release tablet Take 1 tablet (5 mg total) by mouth every 3 (three) hours as needed for moderate pain or severe pain. (Patient not taking: Reported on 10/24/2014) 20 tablet 0   No current facility-administered medications for this visit.    SURGICAL HISTORY:  Past Surgical History  Procedure Laterality Date  . Upper  gastrointestinal endoscopy    . Colonoscopy    . Vaginal hysterectomy    . Salpingoophorectomy Bilateral   . Cholecystectomy    . Cystocele repair    . Ankle surgery Left   . Arm surgery Left     radius  . Cervical fusion    . Dilation and curettage of uterus    . Bladder tacked     . Accupuncture  3 yrs ago  . Esophagogastroduodenoscopy    . Ankle fusion Left 05/10/2014    Procedure: LEFT ANKLE ARTHRODESIS ;  Surgeon: Wylene Simmer, MD;  Location: [facility];  Service: Orthopedics;  Laterality: Left;    REVIEW OF SYSTEMS:  A comprehensive review of systems was negative except for: Constitutional: positive for fatigue Musculoskeletal: positive for arthralgias and myalgias   PHYSICAL EXAMINATION: General appearance: alert, cooperative, fatigued and no distress Head: Normocephalic, without obvious abnormality, atraumatic Neck: no adenopathy, no JVD, supple, symmetrical, trachea midline and thyroid not enlarged, symmetric, no tenderness/mass/nodules Lymph nodes: Cervical, supraclavicular, and axillary nodes normal. Resp: clear to auscultation bilaterally Back: symmetric, no curvature. ROM normal. No CVA tenderness. Cardio: regular rate and rhythm, S1, S2 normal, no murmur, click, rub or gallop GI: soft, non-tender; bowel sounds normal; no masses,  no organomegaly Extremities: extremities normal, atraumatic, no cyanosis or edema  ECOG PERFORMANCE STATUS: 1 - Symptomatic but completely ambulatory  Blood pressure 155/71, pulse 69, temperature 98.2 F (36.8 C), temperature source Oral, resp. rate 17, height 5\' 4"  (1.626 m), weight 228 lb 14.4 oz (103.828 kg), SpO2 99 %.  LABORATORY DATA: Lab Results  Component Value Date   WBC 9.2 10/17/2014   HGB 11.8 10/17/2014   HCT 36.3 10/17/2014   MCV 85.3 10/17/2014   PLT 230 10/17/2014      Chemistry      Component Value Date/Time   NA 143 10/17/2014 0901   NA 145 04/30/2014 0856   K 4.2 10/17/2014 0901   K 4.8 04/30/2014 0856   CL 106  04/30/2014 0856   CL 108* 02/10/2012 0902   CO2 27 10/17/2014 0901   CO2 30 04/30/2014 0856   BUN 22.9 10/17/2014 0901   BUN 24* 04/30/2014 0856   CREATININE 1.1 10/17/2014 0901   CREATININE 0.85 05/10/2014 1917      Component Value Date/Time   CALCIUM 10.1 10/17/2014 0901   CALCIUM 9.5 04/30/2014 0856   ALKPHOS 80 10/17/2014 0901   ALKPHOS 107 08/14/2011 0954   AST 20 10/17/2014 0901   AST 17 08/14/2011 0954   ALT 11 10/17/2014 0901   ALT 12 08/14/2011 0954  BILITOT 0.61 10/17/2014 0901   BILITOT 0.4 08/14/2011 0954     Other lab results: Beta-2 microglobulin 2.49, IgG 779, IgA 690 and IgM 42. Free kappa light chain 4.40, free lambda light chain 1.18 with a kappa/lambda ratio 3.73  RADIOGRAPHIC STUDIES: No results found.  ASSESSMENT AND PLAN: This is a very pleasant 81 years old [place] American female with history of monoclonal gammopathy of unknown significance currently on observation. The patient continues to do well and there is no evidence for disease progression on the recent blood work.  I discussed the lab result with the patient. I recommended for her to continue observation for now with repeat CBC, comprehensive metabolic panel, LDH and myeloma panel in 12 months.  She would come back for followup visit at that time.  She was advised to establish care with a new primary care physician for evaluation and routine management of her other medical conditions. She was advised to call me immediately if she has any concerning symptoms in the interval.  All questions were answered. The patient knows to call the clinic with any problems, questions or concerns. We can certainly see the patient much sooner if necessary. The patient voices understanding of current disease status and treatment options and is in agreement with the current care plan.  All questions were answered. The patient knows to call the clinic with any problems, questions or concerns. We can certainly see the  patient much sooner if necessary.  Disclaimer: This note was dictated with voice recognition software. Similar sounding words can inadvertently be transcribed and may not be corrected upon review.

## 2014-12-03 DIAGNOSIS — M1711 Unilateral primary osteoarthritis, right knee: Secondary | ICD-10-CM | POA: Diagnosis not present

## 2014-12-06 DIAGNOSIS — E559 Vitamin D deficiency, unspecified: Secondary | ICD-10-CM | POA: Diagnosis not present

## 2014-12-06 DIAGNOSIS — E119 Type 2 diabetes mellitus without complications: Secondary | ICD-10-CM | POA: Diagnosis not present

## 2014-12-06 DIAGNOSIS — I10 Essential (primary) hypertension: Secondary | ICD-10-CM | POA: Diagnosis not present

## 2014-12-06 DIAGNOSIS — E538 Deficiency of other specified B group vitamins: Secondary | ICD-10-CM | POA: Diagnosis not present

## 2015-01-15 DIAGNOSIS — M1711 Unilateral primary osteoarthritis, right knee: Secondary | ICD-10-CM | POA: Diagnosis not present

## 2015-01-23 DIAGNOSIS — E780 Pure hypercholesterolemia: Secondary | ICD-10-CM | POA: Diagnosis not present

## 2015-01-23 DIAGNOSIS — E119 Type 2 diabetes mellitus without complications: Secondary | ICD-10-CM | POA: Diagnosis not present

## 2015-01-23 DIAGNOSIS — I10 Essential (primary) hypertension: Secondary | ICD-10-CM | POA: Diagnosis not present

## 2015-01-23 DIAGNOSIS — E559 Vitamin D deficiency, unspecified: Secondary | ICD-10-CM | POA: Diagnosis not present

## 2015-01-29 ENCOUNTER — Ambulatory Visit: Payer: Self-pay | Admitting: Orthopedic Surgery

## 2015-02-15 ENCOUNTER — Ambulatory Visit: Payer: Self-pay | Admitting: Orthopedic Surgery

## 2015-02-15 DIAGNOSIS — M1711 Unilateral primary osteoarthritis, right knee: Secondary | ICD-10-CM | POA: Diagnosis not present

## 2015-02-15 NOTE — H&P (Signed)
TOTAL KNEE ADMISSION H&P  Patient is being admitted for right total knee arthroplasty.  Subjective:  Chief Complaint:right knee pain.  HPI: Julie [name], 81 y.o. female, has a history of pain and functional disability in the right knee due to arthritis and has failed non-surgical conservative treatments for greater than 12 weeks to includeNSAID's and/or analgesics, corticosteriod injections, flexibility and strengthening excercises, supervised PT with diminished ADL's post treatment, use of assistive devices, weight reduction as appropriate and activity modification.  Onset of symptoms was gradual, starting 10 years ago with gradually worsening course since that time. The patient noted no past surgery on the right knee(s).  Patient currently rates pain in the right knee(s) at 10 out of 10 with activity. Patient has night pain, worsening of pain with activity and weight bearing, pain that interferes with activities of daily living, pain with passive range of motion, crepitus and joint swelling.  Patient has evidence of subchondral cysts, subchondral sclerosis, periarticular osteophytes and joint space narrowing by imaging studies.There is no active infection.  Patient Active Problem List   Diagnosis Date Noted  . Post-traumatic arthritis of ankle 05/10/2014  . MGUS (monoclonal gammopathy of unknown significance) 02/17/2012  . Diverticulosis of colon (without mention of hemorrhage) 01/21/2011  . Family history of malignant neoplasm of gastrointestinal tract 01/21/2011  . Special screening for malignant neoplasms, colon 01/21/2011  . Other symptoms involving digestive system(787.99) 01/21/2011   Past Medical History  Diagnosis Date  . Blood transfusion     no abnormal reaction noted  . Cataract   . Heart murmur   . Hyperlipidemia     but doesn't take any meds   . Interstitial cystitis   . Ulcer 1970    duodenal  . Anxiety     takes Klonopin at bedtime  . GERD (gastroesophageal reflux  disease)     takes Nexium daily  . Hypertension     takes Amlodipine,Metoprolol,and Losartan daily  . Complication of anesthesia     wakes up during surgery  . History of hiatal hernia   . Cataracts, bilateral     immature  . Glaucoma   . Histoplasmosis   . Weakness     numbness in both hands and feet  . Arthritis     takes Methotrexate weekly and Prednisone  . Fibromyalgia     takes Lyrica daily  . Peripheral neuropathy   . Joint pain   . Joint swelling   . History of gout     was taking colchicine  . History of colon polyps   . Urinary frequency   . Urinary urgency   . Hard of hearing   . Nocturia   . Anemia   . Diabetes mellitus without complication     was on Metformin but has been off x 3 yrs  . Depression   . History of shingles     Past Surgical History  Procedure Laterality Date  . Upper gastrointestinal endoscopy    . Colonoscopy    . Vaginal hysterectomy    . Salpingoophorectomy Bilateral   . Cholecystectomy    . Cystocele repair    . Ankle surgery Left   . Arm surgery Left     radius  . Cervical fusion    . Dilation and curettage of uterus    . Bladder tacked     . Accupuncture  3 yrs ago  . Esophagogastroduodenoscopy    . Ankle fusion Left 05/10/2014    Procedure: LEFT ANKLE ARTHRODESIS ;  Surgeon: Wylene Simmer, MD;  Location: [place] [facility];  Service: Orthopedics;  Laterality: Left;     (Not in a hospital admission) Allergies  Allergen Reactions  . Aspirin Other (See Comments)    Duodenal ulcer- cannot take aspirin when active... Does well with 81 mg   . Other     Walnuts-anaphylaxis  . [place] [Trospium Chloride] Nausea And Vomiting    Social History  Substance Use Topics  . Smoking status: Never Smoker   . Smokeless tobacco: Never Used  . Alcohol Use: No     Comment: rarely wine    Family History  Problem Relation Age of Onset  . Colon cancer Sister   . Esophageal cancer Brother   . Breast cancer Mother      Review of Systems   Constitutional: Negative.   HENT: Negative.   Eyes: Negative.   Respiratory: Negative.   Cardiovascular: Negative.   Gastrointestinal: Negative.   Genitourinary: Positive for frequency.  Musculoskeletal: Positive for back pain and joint pain.  Skin: Negative.   Neurological: Negative.   Endo/Heme/Allergies: Negative.   Psychiatric/Behavioral: Negative.     Objective:  Physical Exam  Vitals reviewed. Constitutional: She is oriented to person, place, and time. She appears well-developed and well-nourished.  HENT:  Head: Normocephalic and atraumatic.  Eyes: Conjunctivae and EOM are normal. Pupils are equal, round, and reactive to light.  Neck: Normal range of motion. Neck supple.  Cardiovascular: Normal rate, regular rhythm, normal heart sounds and intact distal pulses.   Respiratory: Effort normal and breath sounds normal. No respiratory distress.  GI: Soft. Bowel sounds are normal. She exhibits no distension. There is no tenderness.  Genitourinary:  deferred  Musculoskeletal:       Right knee: She exhibits swelling and effusion. Tenderness found. Lateral joint line tenderness noted.  Neurological: She is alert and oriented to person, place, and time. She has normal reflexes.  Skin: Skin is warm and dry.  Psychiatric: She has a normal mood and affect. Her behavior is normal. Judgment and thought content normal.    Vital signs in last 24 hours: @VSRANGES @  Labs:   Estimated body mass index is 39.27 kg/(m^2) as calculated from the following:   Height as of 10/24/14: 5\' 4"  (1.626 m).   Weight as of 10/24/14: 103.828 kg (228 lb 14.4 oz).   Imaging Review Plain radiographs demonstrate severe degenerative joint disease of the right knee(s). The overall alignment ismild valgus. The bone quality appears to be adequate for age and reported activity level.  Assessment/Plan:  End stage arthritis, right knee   The patient history, physical examination, clinical judgment of the  provider and imaging studies are consistent with end stage degenerative joint disease of the right knee(s) and total knee arthroplasty is deemed medically necessary. The treatment options including medical management, injection therapy arthroscopy and arthroplasty were discussed at length. The risks and benefits of total knee arthroplasty were presented and reviewed. The risks due to aseptic loosening, infection, stiffness, patella tracking problems, thromboembolic complications and other imponderables were discussed. The patient acknowledged the explanation, agreed to proceed with the plan and consent was signed. Patient is being admitted for inpatient treatment for surgery, pain control, PT, OT, prophylactic antibiotics, VTE prophylaxis, progressive ambulation and ADL's and discharge planning. The patient is planning to be discharged home with home health services

## 2015-02-18 DIAGNOSIS — E119 Type 2 diabetes mellitus without complications: Secondary | ICD-10-CM | POA: Diagnosis not present

## 2015-02-18 DIAGNOSIS — H25013 Cortical age-related cataract, bilateral: Secondary | ICD-10-CM | POA: Diagnosis not present

## 2015-02-18 DIAGNOSIS — H2513 Age-related nuclear cataract, bilateral: Secondary | ICD-10-CM | POA: Diagnosis not present

## 2015-02-18 NOTE — Patient Instructions (Addendum)
YOUR PROCEDURE IS SCHEDULED ON :  02/28/15  REPORT TO [facility] MAIN ENTRANCE FOLLOW SIGNS TO EAST ELEVATOR - GO TO 3rd FLOOR CHECK IN AT 3 EAST NURSES STATION (SHORT STAY) AT:  5:30 AM  CALL THIS NUMBER IF YOU HAVE PROBLEMS THE MORNING OF SURGERY [phone]  REMEMBER:ONLY 1 PER PERSON MAY GO TO SHORT STAY WITH YOU TO GET READY THE MORNING OF YOUR SURGERY  DO NOT EAT FOOD OR DRINK LIQUIDS AFTER MIDNIGHT  TAKE THESE MEDICINES THE MORNING OF SURGERY:   AMLODIPINE / NEXIUM / MAY TAKE CLONAZEPAM IF NEEDED  YOU MAY NOT HAVE ANY METAL ON YOUR BODY INCLUDING HAIR PINS AND PIERCING'S. DO NOT WEAR JEWELRY, MAKEUP, LOTIONS, POWDERS OR PERFUMES. DO NOT WEAR NAIL POLISH. DO NOT SHAVE 48 HRS PRIOR TO SURGERY. MEN MAY SHAVE FACE AND NECK.  DO NOT [facility]. [facility] IS NOT RESPONSIBLE FOR VALUABLES.  CONTACTS, DENTURES OR PARTIALS MAY NOT BE WORN TO SURGERY. LEAVE SUITCASE IN CAR. CAN BE BROUGHT TO ROOM AFTER SURGERY.  PATIENTS DISCHARGED THE DAY OF SURGERY WILL NOT BE ALLOWED TO DRIVE HOME.  PLEASE READ OVER THE FOLLOWING INSTRUCTION SHEETS _________________________________________________________________________________                                           - PREPARING FOR SURGERY  Before surgery, you can play an important role.  Because skin is not sterile, your skin needs to be as free of germs as possible.  You can reduce the number of germs on your skin by washing with CHG (chlorahexidine gluconate) soap before surgery.  CHG is an antiseptic cleaner which kills germs and bonds with the skin to continue killing germs even after washing. Please DO NOT use if you have an allergy to CHG or antibacterial soaps.  If your skin becomes reddened/irritated stop using the CHG and inform your nurse when you arrive at Short Stay. Do not shave (including legs and underarms) for at least 48 hours prior to the first CHG shower.  You may shave your  face. Please follow these instructions carefully:   1.  Shower with CHG Soap the night before surgery and the  morning of Surgery.   2.  If you choose to wash your hair, wash your hair first as usual with your  normal  Shampoo.   3.  After you shampoo, rinse your hair and body thoroughly to remove the  shampoo.                                         4.  Use CHG as you would any other liquid soap.  You can apply chg directly  to the skin and wash . Gently wash with scrungie or clean wascloth    5.  Apply the CHG Soap to your body ONLY FROM THE NECK DOWN.   Do not use on open                           Wound or open sores. Avoid contact with eyes, ears mouth and genitals (private parts).  Genitals (private parts) with your normal soap.              6.  Wash thoroughly, paying special attention to the area where your surgery  will be performed.   7.  Thoroughly rinse your body with warm water from the neck down.   8.  DO NOT shower/wash with your normal soap after using and rinsing off  the CHG Soap .                9.  Pat yourself dry with a clean towel.             10.  Wear clean night clothes to bed after shower             11.  Place clean sheets on your bed the night of your first shower and do not  sleep with pets.  Day of Surgery : Do not apply any lotions/deodorants the morning of surgery.  Please wear clean clothes to the hospital/surgery center.  FAILURE TO FOLLOW THESE INSTRUCTIONS MAY RESULT IN THE CANCELLATION OF YOUR SURGERY    PATIENT SIGNATURE_________________________________  ______________________________________________________________________     Adam Phenix  An incentive spirometer is a tool that can help keep your lungs clear and active. This tool measures how well you are filling your lungs with each breath. Taking long deep breaths may help reverse or decrease the chance of developing breathing (pulmonary) problems  (especially infection) following:  A long period of time when you are unable to move or be active. BEFORE THE PROCEDURE   If the spirometer includes an indicator to show your best effort, your nurse or respiratory therapist will set it to a desired goal.  If possible, sit up straight or lean slightly forward. Try not to slouch.  Hold the incentive spirometer in an upright position. INSTRUCTIONS FOR USE   Sit on the edge of your bed if possible, or sit up as far as you can in bed or on a chair.  Hold the incentive spirometer in an upright position.  Breathe out normally.  Place the mouthpiece in your mouth and seal your lips tightly around it.  Breathe in slowly and as deeply as possible, raising the piston or the ball toward the top of the column.  Hold your breath for 3-5 seconds or for as long as possible. Allow the piston or ball to fall to the bottom of the column.  Remove the mouthpiece from your mouth and breathe out normally.  Rest for a few seconds and repeat Steps 1 through 7 at least 10 times every 1-2 hours when you are awake. Take your time and take a few normal breaths between deep breaths.  The spirometer may include an indicator to show your best effort. Use the indicator as a goal to work toward during each repetition.  After each set of 10 deep breaths, practice coughing to be sure your lungs are clear. If you have an incision (the cut made at the time of surgery), support your incision when coughing by placing a pillow or rolled up towels firmly against it. Once you are able to get out of bed, walk around indoors and cough well. You may stop using the incentive spirometer when instructed by your caregiver.  RISKS AND COMPLICATIONS  Take your time so you do not get dizzy or light-headed.  If you are in pain, you may need to take or ask for pain medication before doing incentive spirometry. It is  harder to take a deep breath if you are having pain. AFTER  USE  Rest and breathe slowly and easily.  It can be helpful to keep track of a log of your progress. Your caregiver can provide you with a simple table to help with this. If you are using the spirometer at home, follow these instructions: [place] IF:   You are having difficultly using the spirometer.  You have trouble using the spirometer as often as instructed.  Your pain medication is not giving enough relief while using the spirometer.  You develop fever of 100.5 F (38.1 C) or higher. SEEK IMMEDIATE MEDICAL CARE IF:   You cough up bloody sputum that had not been present before.  You develop fever of 102 F (38.9 C) or greater.  You develop worsening pain at or near the incision site. MAKE SURE YOU:   Understand these instructions.  Will watch your condition.  Will get help right away if you are not doing well or get worse. Document Released: 09/21/2006 Document Revised: 08/03/2011 Document Reviewed: 11/22/2006 Exit[employer] Patient Information 2014 [employer], [place].   ________________________________________________________________________  WHAT IS A BLOOD TRANSFUSION? Blood Transfusion Information  A transfusion is the replacement of blood or some of its parts. Blood is made up of multiple cells which provide different functions.  Red blood cells carry oxygen and are used for blood loss replacement.  White blood cells fight against infection.  Platelets control bleeding.  Plasma helps clot blood.  Other blood products are available for specialized needs, such as hemophilia or other clotting disorders. BEFORE THE TRANSFUSION  Who gives blood for transfusions?   Healthy volunteers who are fully evaluated to make sure their blood is safe. This is blood bank blood. Transfusion therapy is the safest it has ever been in the practice of medicine. Before blood is taken from a donor, a complete history is taken to make sure that person has no history of diseases  nor engages in risky social behavior (examples are intravenous drug use or sexual activity with multiple partners). The donor's travel history is screened to minimize risk of transmitting infections, such as malaria. The donated blood is tested for signs of infectious diseases, such as HIV and hepatitis. The blood is then tested to be sure it is compatible with you in order to minimize the chance of a transfusion reaction. If you or a relative donates blood, this is often done in anticipation of surgery and is not appropriate for emergency situations. It takes many days to process the donated blood. RISKS AND COMPLICATIONS Although transfusion therapy is very safe and saves many lives, the main dangers of transfusion include:   Getting an infectious disease.  Developing a transfusion reaction. This is an allergic reaction to something in the blood you were given. Every precaution is taken to prevent this. The decision to have a blood transfusion has been considered carefully by your caregiver before blood is given. Blood is not given unless the benefits outweigh the risks. AFTER THE TRANSFUSION  Right after receiving a blood transfusion, you will usually feel much better and more energetic. This is especially true if your red blood cells have gotten low (anemic). The transfusion raises the level of the red blood cells which carry oxygen, and this usually causes an energy increase.  The nurse administering the transfusion will monitor you carefully for complications. HOME CARE INSTRUCTIONS  No special instructions are needed after a transfusion. You may find your energy is better. Speak  with your caregiver about any limitations on activity for underlying diseases you may have. SEEK MEDICAL CARE IF:   Your condition is not improving after your transfusion.  You develop redness or irritation at the intravenous (IV) site. SEEK IMMEDIATE MEDICAL CARE IF:  Any of the following symptoms occur over the  next 12 hours:  Shaking chills.  You have a temperature by mouth above 102 F (38.9 C), not controlled by medicine.  Chest, back, or muscle pain.  People around you feel you are not acting correctly or are confused.  Shortness of breath or difficulty breathing.  Dizziness and fainting.  You get a rash or develop hives.  You have a decrease in urine output.  Your urine turns a dark color or changes to pink, red, or brown. Any of the following symptoms occur over the next 10 days:  You have a temperature by mouth above 102 F (38.9 C), not controlled by medicine.  Shortness of breath.  Weakness after normal activity.  The white part of the eye turns yellow (jaundice).  You have a decrease in the amount of urine or are urinating less often.  Your urine turns a dark color or changes to pink, red, or brown. Document Released: 05/08/2000 Document Revised: 08/03/2011 Document Reviewed: 12/26/2007 [facility] Patient Information 2014 [place], [place].  _______________________________________________________________________

## 2015-02-21 ENCOUNTER — Encounter (HOSPITAL_COMMUNITY)
Admission: RE | Admit: 2015-02-21 | Discharge: 2015-02-21 | Disposition: A | Discharge status: Home / Self Care | Payer: Medicare Other | Source: Ambulatory Visit | Attending: Orthopedic Surgery | Admitting: Orthopedic Surgery

## 2015-02-21 ENCOUNTER — Encounter (HOSPITAL_COMMUNITY): Payer: Self-pay

## 2015-02-21 DIAGNOSIS — Z01818 Encounter for other preprocedural examination: Secondary | ICD-10-CM | POA: Insufficient documentation

## 2015-02-21 DIAGNOSIS — M179 Osteoarthritis of knee, unspecified: Secondary | ICD-10-CM | POA: Diagnosis not present

## 2015-02-21 HISTORY — DX: Full incontinence of feces: R15.9

## 2015-02-21 HISTORY — DX: Other specified postprocedural states: Z98.890

## 2015-02-21 HISTORY — DX: Other specified postprocedural states: R11.2

## 2015-02-21 LAB — COMPREHENSIVE METABOLIC PANEL
ALT: 13 U/L — ABNORMAL LOW (ref 14–54)
ANION GAP: 6 (ref 5–15)
AST: 23 U/L (ref 15–41)
Albumin: 3.9 g/dL (ref 3.5–5.0)
Alkaline Phosphatase: 62 U/L (ref 38–126)
BUN: 21 mg/dL — AB (ref 6–20)
CHLORIDE: 110 mmol/L (ref 101–111)
CO2: 30 mmol/L (ref 22–32)
Calcium: 9.8 mg/dL (ref 8.9–10.3)
Creatinine, Ser: 0.83 mg/dL (ref 0.44–1.00)
Glucose, Bld: 120 mg/dL — ABNORMAL HIGH (ref 65–99)
POTASSIUM: 4.7 mmol/L (ref 3.5–5.1)
Sodium: 146 mmol/L — ABNORMAL HIGH (ref 135–145)
Total Bilirubin: 0.4 mg/dL (ref 0.3–1.2)
Total Protein: 7.4 g/dL (ref 6.5–8.1)

## 2015-02-21 LAB — URINALYSIS, ROUTINE W REFLEX MICROSCOPIC
BILIRUBIN URINE: NEGATIVE
GLUCOSE, UA: NEGATIVE mg/dL
HGB URINE DIPSTICK: NEGATIVE
KETONES UR: NEGATIVE mg/dL
Nitrite: NEGATIVE
PH: 5.5 (ref 5.0–8.0)
Protein, ur: NEGATIVE mg/dL
SPECIFIC GRAVITY, URINE: 1.016 (ref 1.005–1.030)
Urobilinogen, UA: 0.2 mg/dL (ref 0.0–1.0)

## 2015-02-21 LAB — CBC
HCT: 37.1 % (ref 36.0–46.0)
Hemoglobin: 11.5 g/dL — ABNORMAL LOW (ref 12.0–15.0)
MCH: 27.7 pg (ref 26.0–34.0)
MCHC: 31 g/dL (ref 30.0–36.0)
MCV: 89.4 fL (ref 78.0–100.0)
PLATELETS: 226 10*3/uL (ref 150–400)
RBC: 4.15 MIL/uL (ref 3.87–5.11)
RDW: 15.1 % (ref 11.5–15.5)
WBC: 5.1 10*3/uL (ref 4.0–10.5)

## 2015-02-21 LAB — URINE MICROSCOPIC-ADD ON

## 2015-02-21 LAB — ABO/RH: ABO/RH(D): O POS

## 2015-02-21 LAB — PROTIME-INR
INR: 0.99 (ref 0.00–1.49)
PROTHROMBIN TIME: 13.3 s (ref 11.6–15.2)

## 2015-02-21 LAB — SURGICAL PCR SCREEN
MRSA, PCR: NEGATIVE
Staphylococcus aureus: NEGATIVE

## 2015-02-21 LAB — APTT: aPTT: 29 seconds (ref 24–37)

## 2015-02-22 DIAGNOSIS — Z23 Encounter for immunization: Secondary | ICD-10-CM | POA: Diagnosis not present

## 2015-02-22 LAB — HEMOGLOBIN A1C
Hgb A1c MFr Bld: 6.5 % — ABNORMAL HIGH (ref 4.8–5.6)
MEAN PLASMA GLUCOSE: 140 mg/dL

## 2015-02-25 NOTE — Anesthesia Preprocedure Evaluation (Addendum)
Anesthesia Evaluation  Patient identified by MRN, date of birth, ID band Patient awake    History of Anesthesia Complications (+) PONV  Airway Mallampati: II       Dental  (+) Teeth Intact, Dental Advisory Given   Pulmonary    breath sounds clear to auscultation       Cardiovascular hypertension, Pt. on medications  Rhythm:Regular     Neuro/Psych Anxiety Depression    GI/Hepatic hiatal hernia, GERD  ,  Endo/Other  diabetes, Type 2  Renal/GU      Musculoskeletal   Abdominal (+)  Abdomen: soft.    Peds  Hematology 11/37, plts 226, INR .99   Anesthesia Other Findings Hx Glaucoma  Reproductive/Obstetrics                          Anesthesia Physical Anesthesia Plan  ASA: III  Anesthesia Plan: Spinal and General   Post-op Pain Management:    Induction:   Airway Management Planned: Oral ETT and Nasal ETT  Additional Equipment:   Intra-op Plan:   Post-operative Plan:   Informed Consent: I have reviewed the patients History and Physical, chart, labs and discussed the procedure including the risks, benefits and alternatives for the proposed anesthesia with the patient or authorized representative who has indicated his/her understanding and acceptance.     Plan Discussed with:   Anesthesia Plan Comments: (Will offer spina, Hx of Glaucomal)       Anesthesia Quick Evaluation

## 2015-02-28 ENCOUNTER — Inpatient Hospital Stay (HOSPITAL_COMMUNITY): Payer: Medicare Other | Admitting: Anesthesiology

## 2015-02-28 ENCOUNTER — Inpatient Hospital Stay (HOSPITAL_COMMUNITY): Payer: Medicare Other

## 2015-02-28 ENCOUNTER — Inpatient Hospital Stay (HOSPITAL_COMMUNITY)
Admission: RE | Admit: 2015-02-28 | Discharge: 2015-03-02 | DRG: 470 | Disposition: A | Discharge status: Home Health | Payer: Medicare Other | Source: Ambulatory Visit | Attending: Orthopedic Surgery | Admitting: Orthopedic Surgery

## 2015-02-28 ENCOUNTER — Encounter (HOSPITAL_COMMUNITY): Payer: Self-pay | Admitting: *Deleted

## 2015-02-28 ENCOUNTER — Encounter (HOSPITAL_COMMUNITY)
Admission: RE | Disposition: A | Discharge status: Home Health | Payer: Self-pay | Source: Ambulatory Visit | Attending: Orthopedic Surgery

## 2015-02-28 DIAGNOSIS — M25561 Pain in right knee: Secondary | ICD-10-CM | POA: Diagnosis not present

## 2015-02-28 DIAGNOSIS — K449 Diaphragmatic hernia without obstruction or gangrene: Secondary | ICD-10-CM | POA: Diagnosis present

## 2015-02-28 DIAGNOSIS — Z96651 Presence of right artificial knee joint: Secondary | ICD-10-CM | POA: Diagnosis not present

## 2015-02-28 DIAGNOSIS — Z8619 Personal history of other infectious and parasitic diseases: Secondary | ICD-10-CM | POA: Diagnosis not present

## 2015-02-28 DIAGNOSIS — K219 Gastro-esophageal reflux disease without esophagitis: Secondary | ICD-10-CM | POA: Diagnosis present

## 2015-02-28 DIAGNOSIS — M797 Fibromyalgia: Secondary | ICD-10-CM | POA: Diagnosis present

## 2015-02-28 DIAGNOSIS — H9190 Unspecified hearing loss, unspecified ear: Secondary | ICD-10-CM | POA: Diagnosis present

## 2015-02-28 DIAGNOSIS — F419 Anxiety disorder, unspecified: Secondary | ICD-10-CM | POA: Diagnosis present

## 2015-02-28 DIAGNOSIS — Z471 Aftercare following joint replacement surgery: Secondary | ICD-10-CM | POA: Diagnosis not present

## 2015-02-28 DIAGNOSIS — M1711 Unilateral primary osteoarthritis, right knee: Secondary | ICD-10-CM | POA: Diagnosis not present

## 2015-02-28 DIAGNOSIS — Z01812 Encounter for preprocedural laboratory examination: Secondary | ICD-10-CM

## 2015-02-28 DIAGNOSIS — Z79899 Other long term (current) drug therapy: Secondary | ICD-10-CM

## 2015-02-28 DIAGNOSIS — H409 Unspecified glaucoma: Secondary | ICD-10-CM | POA: Diagnosis present

## 2015-02-28 DIAGNOSIS — I10 Essential (primary) hypertension: Secondary | ICD-10-CM | POA: Diagnosis present

## 2015-02-28 DIAGNOSIS — F329 Major depressive disorder, single episode, unspecified: Secondary | ICD-10-CM | POA: Diagnosis present

## 2015-02-28 DIAGNOSIS — E119 Type 2 diabetes mellitus without complications: Secondary | ICD-10-CM | POA: Diagnosis present

## 2015-02-28 DIAGNOSIS — Z09 Encounter for follow-up examination after completed treatment for conditions other than malignant neoplasm: Secondary | ICD-10-CM

## 2015-02-28 DIAGNOSIS — M179 Osteoarthritis of knee, unspecified: Secondary | ICD-10-CM | POA: Diagnosis not present

## 2015-02-28 HISTORY — PX: TOTAL KNEE ARTHROPLASTY: SHX125

## 2015-02-28 LAB — GLUCOSE, CAPILLARY: Glucose-Capillary: 108 mg/dL — ABNORMAL HIGH (ref 65–99)

## 2015-02-28 LAB — TYPE AND SCREEN
ABO/RH(D): O POS
Antibody Screen: NEGATIVE

## 2015-02-28 SURGERY — ARTHROPLASTY, KNEE, TOTAL
Anesthesia: General | Site: Knee | Laterality: Right

## 2015-02-28 MED ORDER — HYDROMORPHONE HCL 1 MG/ML IJ SOLN
0.5000 mg | INTRAMUSCULAR | Status: DC | PRN
  Administered 2015-02-28 – 2015-03-01 (×3): 0.5 mg via INTRAVENOUS
  Filled 2015-02-28 (×3): qty 1

## 2015-02-28 MED ORDER — CEFAZOLIN SODIUM-DEXTROSE 2-3 GM-% IV SOLR
INTRAVENOUS | Status: AC
  Filled 2015-02-28: qty 50

## 2015-02-28 MED ORDER — CHLORHEXIDINE GLUCONATE 4 % EX LIQD: 60.0000 mL | Freq: Once | CUTANEOUS | Status: DC

## 2015-02-28 MED ORDER — ACETAMINOPHEN 650 MG RE SUPP: 650.0000 mg | Freq: Four times a day (QID) | RECTAL | Status: DC | PRN

## 2015-02-28 MED ORDER — FOLIC ACID 1 MG PO TABS
2.0000 mg | ORAL_TABLET | Freq: Every day | ORAL | Status: DC
  Administered 2015-02-28 – 2015-03-02 (×3): 2 mg via ORAL
  Filled 2015-02-28 (×3): qty 2

## 2015-02-28 MED ORDER — BUPIVACAINE-EPINEPHRINE (PF) 0.25% -1:200000 IJ SOLN
INTRAMUSCULAR | Status: AC
  Filled 2015-02-28: qty 30

## 2015-02-28 MED ORDER — PROPOFOL 500 MG/50ML IV EMUL
INTRAVENOUS | Status: DC | PRN
  Administered 2015-02-28: 100 ug/kg/min via INTRAVENOUS

## 2015-02-28 MED ORDER — ACETAMINOPHEN 10 MG/ML IV SOLN
INTRAVENOUS | Status: AC
  Filled 2015-02-28: qty 100

## 2015-02-28 MED ORDER — DIPHENHYDRAMINE HCL 12.5 MG/5ML PO ELIX: 12.5000 mg | ORAL_SOLUTION | ORAL | Status: DC | PRN

## 2015-02-28 MED ORDER — HYDROCHLOROTHIAZIDE 12.5 MG PO CAPS
12.5000 mg | ORAL_CAPSULE | Freq: Every day | ORAL | Status: DC
  Administered 2015-02-28 – 2015-03-02 (×3): 12.5 mg via ORAL
  Filled 2015-02-28 (×3): qty 1

## 2015-02-28 MED ORDER — METOCLOPRAMIDE HCL 10 MG PO TABS: 5.0000 mg | ORAL_TABLET | Freq: Three times a day (TID) | ORAL | Status: DC | PRN

## 2015-02-28 MED ORDER — PROPOFOL 10 MG/ML IV BOLUS
INTRAVENOUS | Status: AC
  Filled 2015-02-28: qty 20

## 2015-02-28 MED ORDER — APIXABAN 2.5 MG PO TABS
2.5000 mg | ORAL_TABLET | Freq: Two times a day (BID) | ORAL | Status: DC
  Administered 2015-03-01 – 2015-03-02 (×3): 2.5 mg via ORAL
  Filled 2015-02-28 (×5): qty 1

## 2015-02-28 MED ORDER — PROPOFOL 10 MG/ML IV BOLUS
INTRAVENOUS | Status: DC | PRN
  Administered 2015-02-28: 30 mg via INTRAVENOUS
  Administered 2015-02-28: 20 mg via INTRAVENOUS

## 2015-02-28 MED ORDER — KETOROLAC TROMETHAMINE 30 MG/ML IJ SOLN
INTRAMUSCULAR | Status: AC
  Filled 2015-02-28: qty 1

## 2015-02-28 MED ORDER — PREGABALIN 75 MG PO CAPS
75.0000 mg | ORAL_CAPSULE | Freq: Two times a day (BID) | ORAL | Status: DC
  Administered 2015-02-28 – 2015-03-02 (×4): 75 mg via ORAL
  Filled 2015-02-28 (×4): qty 1

## 2015-02-28 MED ORDER — ISOPROPYL ALCOHOL 70 % SOLN
Status: DC | PRN
  Administered 2015-02-28: 1 via TOPICAL

## 2015-02-28 MED ORDER — CEFAZOLIN SODIUM-DEXTROSE 2-3 GM-% IV SOLR
2.0000 g | Freq: Four times a day (QID) | INTRAVENOUS | Status: AC
  Administered 2015-02-28 (×2): 2 g via INTRAVENOUS
  Filled 2015-02-28 (×2): qty 50

## 2015-02-28 MED ORDER — BUPIVACAINE HCL (PF) 0.5 % IJ SOLN
INTRAMUSCULAR | Status: DC | PRN
  Administered 2015-02-28: 2.2 mL

## 2015-02-28 MED ORDER — SENNA 8.6 MG PO TABS
2.0000 | ORAL_TABLET | Freq: Every day | ORAL | Status: DC
  Administered 2015-02-28: 17.2 mg via ORAL

## 2015-02-28 MED ORDER — MEPERIDINE HCL 50 MG/ML IJ SOLN: 6.2500 mg | INTRAMUSCULAR | Status: DC | PRN

## 2015-02-28 MED ORDER — DOCUSATE SODIUM 100 MG PO CAPS
100.0000 mg | ORAL_CAPSULE | Freq: Two times a day (BID) | ORAL | Status: DC
  Administered 2015-02-28 – 2015-03-02 (×4): 100 mg via ORAL

## 2015-02-28 MED ORDER — PHENYLEPHRINE HCL 10 MG/ML IJ SOLN
INTRAMUSCULAR | Status: DC | PRN
  Administered 2015-02-28: 120 ug via INTRAVENOUS
  Administered 2015-02-28: 160 ug via INTRAVENOUS
  Administered 2015-02-28: 120 ug via INTRAVENOUS

## 2015-02-28 MED ORDER — AMLODIPINE BESYLATE 10 MG PO TABS
10.0000 mg | ORAL_TABLET | Freq: Every day | ORAL | Status: DC
  Administered 2015-03-01 – 2015-03-02 (×2): 10 mg via ORAL
  Filled 2015-02-28 (×2): qty 1

## 2015-02-28 MED ORDER — SODIUM CHLORIDE 0.9 % IV SOLN: INTRAVENOUS | Status: DC

## 2015-02-28 MED ORDER — CEFAZOLIN SODIUM-DEXTROSE 2-3 GM-% IV SOLR
2.0000 g | INTRAVENOUS | Status: AC
  Administered 2015-02-28: 2 g via INTRAVENOUS

## 2015-02-28 MED ORDER — DEXTROSE 5 % IV SOLN
10.0000 mg | INTRAVENOUS | Status: DC | PRN
  Administered 2015-02-28: 50 ug/min via INTRAVENOUS

## 2015-02-28 MED ORDER — KETOROLAC TROMETHAMINE 30 MG/ML IJ SOLN
INTRAMUSCULAR | Status: DC | PRN
  Administered 2015-02-28: 4 mg
  Administered 2015-02-28: 56 mg

## 2015-02-28 MED ORDER — FENTANYL CITRATE (PF) 100 MCG/2ML IJ SOLN
INTRAMUSCULAR | Status: AC
  Filled 2015-02-28: qty 2

## 2015-02-28 MED ORDER — LACTATED RINGERS IV SOLN
INTRAVENOUS | Status: DC | PRN
  Administered 2015-02-28 (×4): via INTRAVENOUS

## 2015-02-28 MED ORDER — ONDANSETRON HCL 4 MG/2ML IJ SOLN
INTRAMUSCULAR | Status: AC
  Filled 2015-02-28: qty 2

## 2015-02-28 MED ORDER — HYDROGEN PEROXIDE 3 % EX SOLN
CUTANEOUS | Status: AC
  Filled 2015-02-28: qty 473

## 2015-02-28 MED ORDER — FENTANYL CITRATE (PF) 100 MCG/2ML IJ SOLN
INTRAMUSCULAR | Status: DC | PRN
  Administered 2015-02-28: 100 ug via INTRAVENOUS

## 2015-02-28 MED ORDER — ONDANSETRON HCL 4 MG/2ML IJ SOLN
INTRAMUSCULAR | Status: DC | PRN
  Administered 2015-02-28 (×2): 4 mg via INTRAVENOUS

## 2015-02-28 MED ORDER — ACETAMINOPHEN 325 MG PO TABS: 650.0000 mg | ORAL_TABLET | Freq: Four times a day (QID) | ORAL | Status: DC | PRN

## 2015-02-28 MED ORDER — PHENYLEPHRINE 40 MCG/ML (10ML) SYRINGE FOR IV PUSH (FOR BLOOD PRESSURE SUPPORT)
PREFILLED_SYRINGE | INTRAVENOUS | Status: AC
  Filled 2015-02-28: qty 10

## 2015-02-28 MED ORDER — FENTANYL CITRATE (PF) 100 MCG/2ML IJ SOLN
INTRAMUSCULAR | Status: AC
  Filled 2015-02-28: qty 4

## 2015-02-28 MED ORDER — HYDROGEN PEROXIDE 3 % EX SOLN
CUTANEOUS | Status: DC | PRN
  Administered 2015-02-28: 1 via TOPICAL

## 2015-02-28 MED ORDER — IRBESARTAN 300 MG PO TABS
300.0000 mg | ORAL_TABLET | Freq: Every day | ORAL | Status: DC
  Administered 2015-02-28 – 2015-03-02 (×3): 300 mg via ORAL
  Filled 2015-02-28 (×3): qty 1

## 2015-02-28 MED ORDER — ACETAMINOPHEN 10 MG/ML IV SOLN
INTRAVENOUS | Status: DC | PRN
  Administered 2015-02-28: 1000 mg via INTRAVENOUS

## 2015-02-28 MED ORDER — BUPIVACAINE HCL (PF) 0.5 % IJ SOLN
INTRAMUSCULAR | Status: AC
  Filled 2015-02-28: qty 30

## 2015-02-28 MED ORDER — SODIUM CHLORIDE 0.9 % IJ SOLN
INTRAMUSCULAR | Status: AC
  Filled 2015-02-28: qty 50

## 2015-02-28 MED ORDER — SODIUM CHLORIDE 0.9 % IJ SOLN
INTRAMUSCULAR | Status: DC | PRN
  Administered 2015-02-28: 26 mL
  Administered 2015-02-28: 4 mL

## 2015-02-28 MED ORDER — OXYCODONE HCL ER 10 MG PO T12A
10.0000 mg | EXTENDED_RELEASE_TABLET | Freq: Two times a day (BID) | ORAL | Status: DC
  Administered 2015-02-28 – 2015-03-02 (×4): 10 mg via ORAL
  Filled 2015-02-28 (×4): qty 1

## 2015-02-28 MED ORDER — PHENOL 1.4 % MT LIQD
1.0000 | OROMUCOSAL | Status: DC | PRN
Start: 2015-02-28 — End: 2015-03-02

## 2015-02-28 MED ORDER — FENTANYL CITRATE (PF) 100 MCG/2ML IJ SOLN
25.0000 ug | INTRAMUSCULAR | Status: DC | PRN
  Administered 2015-02-28 (×3): 50 ug via INTRAVENOUS

## 2015-02-28 MED ORDER — DEXAMETHASONE SODIUM PHOSPHATE 10 MG/ML IJ SOLN
INTRAMUSCULAR | Status: AC
  Filled 2015-02-28: qty 1

## 2015-02-28 MED ORDER — MENTHOL 3 MG MT LOZG: 1.0000 | LOZENGE | OROMUCOSAL | Status: DC | PRN

## 2015-02-28 MED ORDER — SODIUM CHLORIDE 0.9 % IV SOLN
INTRAVENOUS | Status: DC
  Administered 2015-02-28 (×2): via INTRAVENOUS

## 2015-02-28 MED ORDER — PHENYLEPHRINE HCL 10 MG/ML IJ SOLN
INTRAMUSCULAR | Status: AC
  Filled 2015-02-28: qty 1

## 2015-02-28 MED ORDER — TRANEXAMIC ACID 1000 MG/10ML IV SOLN
1000.0000 mg | INTRAVENOUS | Status: AC
  Administered 2015-02-28: 1000 mg via INTRAVENOUS
  Filled 2015-02-28 (×2): qty 10

## 2015-02-28 MED ORDER — METOCLOPRAMIDE HCL 5 MG/ML IJ SOLN
5.0000 mg | Freq: Three times a day (TID) | INTRAMUSCULAR | Status: DC | PRN
  Administered 2015-03-01: 10 mg via INTRAVENOUS
  Filled 2015-02-28: qty 2

## 2015-02-28 MED ORDER — EZETIMIBE 10 MG PO TABS
10.0000 mg | ORAL_TABLET | ORAL | Status: DC
  Administered 2015-03-01: 10 mg via ORAL
  Filled 2015-02-28: qty 1

## 2015-02-28 MED ORDER — KETOROLAC TROMETHAMINE 15 MG/ML IJ SOLN
15.0000 mg | Freq: Four times a day (QID) | INTRAMUSCULAR | Status: AC
  Administered 2015-02-28 – 2015-03-01 (×4): 15 mg via INTRAVENOUS
  Filled 2015-02-28 (×4): qty 1

## 2015-02-28 MED ORDER — DEXAMETHASONE SODIUM PHOSPHATE 10 MG/ML IJ SOLN
INTRAMUSCULAR | Status: DC | PRN
  Administered 2015-02-28: 10 mg via INTRAVENOUS

## 2015-02-28 MED ORDER — ALUM & MAG HYDROXIDE-SIMETH 200-200-20 MG/5ML PO SUSP
30.0000 mL | ORAL | Status: DC | PRN
Start: 2015-02-28 — End: 2015-03-02

## 2015-02-28 MED ORDER — HYDROCODONE-ACETAMINOPHEN 5-325 MG PO TABS
1.0000 | ORAL_TABLET | ORAL | Status: DC | PRN
  Administered 2015-02-28 – 2015-03-02 (×5): 2 via ORAL
  Administered 2015-03-02 (×2): 1 via ORAL
  Filled 2015-02-28 (×3): qty 2
  Filled 2015-02-28: qty 1
  Filled 2015-02-28 (×2): qty 2
  Filled 2015-02-28: qty 1
  Filled 2015-02-28: qty 2

## 2015-02-28 MED ORDER — SODIUM CHLORIDE 0.9 % IR SOLN
Status: DC | PRN
  Administered 2015-02-28: 1000 mL

## 2015-02-28 MED ORDER — PANTOPRAZOLE SODIUM 40 MG PO TBEC
80.0000 mg | DELAYED_RELEASE_TABLET | Freq: Every day | ORAL | Status: DC
  Administered 2015-03-01: 80 mg via ORAL
  Filled 2015-02-28 (×2): qty 2

## 2015-02-28 MED ORDER — ONDANSETRON HCL 4 MG PO TABS: 4.0000 mg | ORAL_TABLET | Freq: Four times a day (QID) | ORAL | Status: DC | PRN

## 2015-02-28 MED ORDER — CALCIUM CARBONATE-VITAMIN D 500-200 MG-UNIT PO TABS
1.0000 | ORAL_TABLET | Freq: Every day | ORAL | Status: DC
  Administered 2015-02-28 – 2015-03-02 (×3): 1 via ORAL
  Filled 2015-02-28 (×3): qty 1

## 2015-02-28 MED ORDER — CLONAZEPAM 0.5 MG PO TABS
0.5000 mg | ORAL_TABLET | Freq: Every day | ORAL | Status: DC
  Administered 2015-02-28 – 2015-03-01 (×2): 0.5 mg via ORAL
  Filled 2015-02-28 (×2): qty 1

## 2015-02-28 MED ORDER — ONDANSETRON HCL 4 MG/2ML IJ SOLN: 4.0000 mg | Freq: Four times a day (QID) | INTRAMUSCULAR | Status: DC | PRN

## 2015-02-28 MED ORDER — VALSARTAN-HYDROCHLOROTHIAZIDE 320-12.5 MG PO TABS: 1.0000 | ORAL_TABLET | Freq: Every day | ORAL | Status: DC

## 2015-02-28 MED ORDER — DEXAMETHASONE SODIUM PHOSPHATE 10 MG/ML IJ SOLN
10.0000 mg | Freq: Once | INTRAMUSCULAR | Status: AC
  Administered 2015-03-01: 10 mg via INTRAVENOUS
  Filled 2015-02-28: qty 1

## 2015-02-28 MED ORDER — BUPIVACAINE-EPINEPHRINE (PF) 0.25% -1:200000 IJ SOLN
INTRAMUSCULAR | Status: DC | PRN
  Administered 2015-02-28: 4 mL
  Administered 2015-02-28: 56 mL

## 2015-02-28 MED ORDER — ISOPROPYL ALCOHOL 70 % SOLN
Status: AC
  Filled 2015-02-28: qty 480

## 2015-02-28 MED ORDER — PROMETHAZINE HCL 25 MG/ML IJ SOLN: 6.2500 mg | INTRAMUSCULAR | Status: DC | PRN

## 2015-02-28 SURGICAL SUPPLY — 58 items
BAG ZIPLOCK 12X15 (MISCELLANEOUS) IMPLANT
BANDAGE ELASTIC 4 VELCRO ST LF (GAUZE/BANDAGES/DRESSINGS) ×3 IMPLANT
BANDAGE ELASTIC 6 VELCRO ST LF (GAUZE/BANDAGES/DRESSINGS) ×3 IMPLANT
BANDAGE ESMARK 6X9 LF (GAUZE/BANDAGES/DRESSINGS) ×1 IMPLANT
BLADE SAW RECIPROCATING 77.5 (BLADE) ×3 IMPLANT
BNDG ESMARK 6X9 LF (GAUZE/BANDAGES/DRESSINGS) ×3
CAPT KNEE TRIATH TK-4 ×3 IMPLANT
CHLORAPREP W/TINT 26ML (MISCELLANEOUS) ×6 IMPLANT
CUFF TOURN SGL QUICK 34 (TOURNIQUET CUFF) ×2
CUFF TRNQT CYL 34X4X40X1 (TOURNIQUET CUFF) ×1 IMPLANT
DECANTER SPIKE VIAL GLASS SM (MISCELLANEOUS) ×3 IMPLANT
DRAPE EXTREMITY T 121X128X90 (DRAPE) ×3 IMPLANT
DRAPE LG THREE QUARTER DISP (DRAPES) ×6 IMPLANT
DRAPE POUCH INSTRU U-SHP 10X18 (DRAPES) ×3 IMPLANT
DRAPE U-SHAPE 47X51 STRL (DRAPES) ×3 IMPLANT
DRSG AQUACEL AG ADV 3.5X10 (GAUZE/BANDAGES/DRESSINGS) ×3 IMPLANT
DRSG TEGADERM 4X4.75 (GAUZE/BANDAGES/DRESSINGS) IMPLANT
ELECT PENCIL ROCKER SW 15FT (MISCELLANEOUS) ×3 IMPLANT
ELECT REM PT RETURN 15FT ADLT (MISCELLANEOUS) ×3 IMPLANT
EVACUATOR 1/8 PVC DRAIN (DRAIN) IMPLANT
FACESHIELD WRAPAROUND (MASK) ×9 IMPLANT
GAUZE SPONGE 4X4 12PLY STRL (GAUZE/BANDAGES/DRESSINGS) ×3 IMPLANT
GLOVE BIO SURGEON STRL SZ8.5 (GLOVE) ×6 IMPLANT
GLOVE BIOGEL PI IND STRL 8.5 (GLOVE) ×1 IMPLANT
GLOVE BIOGEL PI INDICATOR 8.5 (GLOVE) ×2
GOWN SPEC L3 XXLG W/TWL (GOWN DISPOSABLE) ×3 IMPLANT
HANDPIECE INTERPULSE COAX TIP (DISPOSABLE) ×2
HOOD PEEL AWAY FACE SHEILD DIS (HOOD) ×6 IMPLANT
KIT BASIN OR (CUSTOM PROCEDURE TRAY) ×3 IMPLANT
LIQUID BAND (GAUZE/BANDAGES/DRESSINGS) ×3 IMPLANT
MANIFOLD NEPTUNE II (INSTRUMENTS) ×3 IMPLANT
NEEDLE SPNL 18GX3.5 QUINCKE PK (NEEDLE) ×3 IMPLANT
PACK TOTAL JOINT (CUSTOM PROCEDURE TRAY) ×3 IMPLANT
PADDING CAST COTTON 6X4 STRL (CAST SUPPLIES) ×3 IMPLANT
PEN SKIN MARKING BROAD (MISCELLANEOUS) ×3 IMPLANT
POSITIONER SURGICAL ARM (MISCELLANEOUS) ×3 IMPLANT
SAW OSC TIP CART 19.5X105X1.3 (SAW) ×3 IMPLANT
SEALER BIPOLAR AQUA 6.0 (INSTRUMENTS) ×3 IMPLANT
SET HNDPC FAN SPRY TIP SCT (DISPOSABLE) ×1 IMPLANT
SET PAD KNEE POSITIONER (MISCELLANEOUS) ×3 IMPLANT
SOL PREP POV-IOD 4OZ 10% (MISCELLANEOUS) ×3 IMPLANT
SPONGE DRAIN TRACH 4X4 STRL 2S (GAUZE/BANDAGES/DRESSINGS) IMPLANT
SUCTION FRAZIER 12FR DISP (SUCTIONS) ×3 IMPLANT
SUT MNCRL AB 3-0 PS2 18 (SUTURE) ×3 IMPLANT
SUT MON AB 2-0 CT1 36 (SUTURE) ×6 IMPLANT
SUT VIC AB 1 CT1 36 (SUTURE) ×3 IMPLANT
SUT VIC AB 2-0 CT1 27 (SUTURE) ×2
SUT VIC AB 2-0 CT1 TAPERPNT 27 (SUTURE) ×1 IMPLANT
SUT VLOC 180 0 24IN GS25 (SUTURE) ×3 IMPLANT
SYR 50ML LL SCALE MARK (SYRINGE) ×3 IMPLANT
TOWEL OR 17X26 10 PK STRL BLUE (TOWEL DISPOSABLE) ×6 IMPLANT
TOWEL OR NON WOVEN STRL DISP B (DISPOSABLE) ×3 IMPLANT
TOWER CARTRIDGE SMART MIX (DISPOSABLE) IMPLANT
TRAY FOLEY W/METER SILVER 14FR (SET/KITS/TRAYS/PACK) ×3 IMPLANT
TRAY FOLEY W/METER SILVER 16FR (SET/KITS/TRAYS/PACK) IMPLANT
WATER STERILE IRR 1500ML POUR (IV SOLUTION) ×3 IMPLANT
WRAP KNEE MAXI GEL POST OP (GAUZE/BANDAGES/DRESSINGS) ×3 IMPLANT
YANKAUER SUCT BULB TIP 10FT TU (MISCELLANEOUS) ×3 IMPLANT

## 2015-02-28 NOTE — Anesthesia Postprocedure Evaluation (Signed)
  Anesthesia Post-op Note  Patient: Julie [name]  Procedure(s) Performed: Procedure(s): RIGHT TOTAL KNEE ARTHROPLASTY (Right)  Patient Location: PACU  Anesthesia Type:Spinal  Level of Consciousness: awake, alert  and oriented  Airway and Oxygen Therapy: Patient Spontanous Breathing and Patient connected to nasal cannula oxygen  Post-op Pain: none  Post-op Assessment: Post-op Vital signs reviewed, Patient's Cardiovascular Status Stable, Respiratory Function Stable, Patent Airway and No signs of Nausea or vomiting LLE Motor Response: Purposeful movement LLE Sensation: Decreased RLE Motor Response: Purposeful movement RLE Sensation: Decreased L Sensory Level: S1-Sole of foot, small toes R Sensory Level: S1-Sole of foot, small toes  Post-op Vital Signs: Reviewed and stable  Last Vitals:  Filed Vitals:   02/28/15 1017  BP: 146/77  Pulse: 66  Temp: 36.6 C  Resp: 16    Complications: No apparent anesthesia complications

## 2015-02-28 NOTE — Discharge Summary (Signed)
Physician Discharge Summary  Patient ID: Julie [name] MRN: [id number] DOB/AGE: 30-Aug-1932 81 y.o.  Admit date: 02/28/2015 Discharge date: 03/02/2015  Admission Diagnoses:  Primary osteoarthritis of right knee  Discharge Diagnoses:  Principal Problem:   Primary osteoarthritis of right knee   Past Medical History  Diagnosis Date  . Blood transfusion     no abnormal reaction noted  . Heart murmur   . Hyperlipidemia   . Interstitial cystitis   . Ulcer 1970    duodenal  . Anxiety     takes Klonopin at bedtime  . GERD (gastroesophageal reflux disease)     takes Nexium daily  . Hypertension     takes Amlodipine,Metoprolol,and Losartan daily  . Complication of anesthesia     wakes up during surgery  . History of hiatal hernia   . Cataracts, bilateral     immature  . Glaucoma   . Histoplasmosis   . Weakness     numbness in both hands and feet  . Arthritis     takes Methotrexate weekly and Prednisone  . Fibromyalgia     takes Lyrica daily  . Peripheral neuropathy ([place])   . Joint pain   . History of colon polyps   . Urinary frequency   . Urinary urgency   . Hard of hearing   . Nocturia   . Anemia   . Depression   . History of shingles   . PONV (postoperative nausea and vomiting)   . History of gout   . Incontinence of bowel   . Diabetes mellitus without complication ([place])     was on Metformin but has been off x 6 yrs    Surgeries: Procedure(s): RIGHT TOTAL KNEE ARTHROPLASTY on 02/28/2015   Consultants (if any):    Discharged Condition: Improved  Hospital Course: Julie [name] is an 81 y.o. female who was admitted 02/28/2015 with a diagnosis of Primary osteoarthritis of right knee and went to the operating room on 02/28/2015 and underwent the above named procedures.    She was given perioperative antibiotics:      Anti-infectives    Start     Dose/Rate Route Frequency Ordered Stop   02/28/15 1400  ceFAZolin (ANCEF) IVPB 2 g/50 mL premix     2 g 100  mL/hr over 30 Minutes Intravenous Every 6 hours 02/28/15 1143 02/28/15 2027   02/28/15 0540  ceFAZolin (ANCEF) IVPB 2 g/50 mL premix     2 g 100 mL/hr over 30 Minutes Intravenous On call to O.R. 02/28/15 0540 02/28/15 0732    .  She was given sequential compression devices, early ambulation, and apixiban for DVT prophylaxis.  She benefited maximally from the hospital stay and there were no complications.    Recent vital signs:  Filed Vitals:   03/02/15 0437  BP: 120/54  Pulse: 80  Temp: 98.4 F (36.9 C)  Resp: 17    Recent laboratory studies:  Lab Results  Component Value Date   HGB 7.7* 03/02/2015   HGB 9.1* 03/01/2015   HGB 11.5* 02/21/2015   Lab Results  Component Value Date   WBC 8.5 03/02/2015   PLT 189 03/02/2015   Lab Results  Component Value Date   INR 0.99 02/21/2015   Lab Results  Component Value Date   NA 140 03/01/2015   K 4.3 03/01/2015   CL 108 03/01/2015   CO2 26 03/01/2015   BUN 24* 03/01/2015   CREATININE 1.10* 03/01/2015   GLUCOSE 132* 03/01/2015  Discharge Medications:     Medication List    STOP taking these medications        acetaminophen 325 MG tablet  Commonly known as:  TYLENOL     aspirin EC 81 MG tablet     meloxicam 15 MG tablet  Commonly known as:  MOBIC     METHOTREXATE (PF)      oxyCODONE 5 MG immediate release tablet  Commonly known as:  Oxy IR/ROXICODONE  Replaced by:  OxyCODONE 10 mg T12a 12 hr tablet     predniSONE 5 MG tablet  Commonly known as:  DELTASONE      TAKE these medications        amLODipine 10 MG tablet  Commonly known as:  NORVASC  Take 10 mg by mouth every morning.     apixaban 2.5 MG Tabs tablet  Commonly known as:  ELIQUIS  Take 1 tablet (2.5 mg total) by mouth every 12 (twelve) hours.     calcium-vitamin D 500-200 MG-UNIT tablet  Commonly known as:  OSCAL WITH D  Take 1 tablet by mouth every morning.     clonazePAM 0.5 MG tablet  Commonly known as:  KLONOPIN  Take 0.5 mg by  mouth at bedtime.     docusate sodium 100 MG capsule  Commonly known as:  COLACE  Take 1 capsule (100 mg total) by mouth 2 (two) times daily.     esomeprazole 40 MG capsule  Commonly known as:  NEXIUM  Take 40 mg by mouth daily before breakfast.     ezetimibe 10 MG tablet  Commonly known as:  ZETIA  Take 10 mg by mouth every other day. Trial, taking every other day     fish oil-omega-3 fatty acids 1000 MG capsule  Take 1 g by mouth every morning.     folic acid 1 MG tablet  Commonly known as:  FOLVITE  Take 2 mg by mouth every morning.     HYDROcodone-acetaminophen 5-325 MG tablet  Commonly known as:  NORCO  Take 1-2 tablets by mouth every 4 (four) hours as needed for moderate pain.     ondansetron 4 MG tablet  Commonly known as:  ZOFRAN  Take 1 tablet (4 mg total) by mouth every 6 (six) hours as needed for nausea.     OxyCODONE 10 mg T12a 12 hr tablet  Commonly known as:  OXYCONTIN  Take 1 tablet (10 mg total) by mouth PRO. 1 tab PO every 12 hours for 3 days, then 1 tab PO daily for 4 days     pregabalin 75 MG capsule  Commonly known as:  LYRICA  Take 75 mg by mouth 2 (two) times daily.     senna 8.6 MG Tabs tablet  Commonly known as:  SENOKOT  Take 2 tablets (17.2 mg total) by mouth at bedtime.     valsartan-hydrochlorothiazide 320-12.5 MG tablet  Commonly known as:  DIOVAN-HCT  Take 1 tablet by mouth daily.        Diagnostic Studies: Dg Knee Right Port  02/28/2015   CLINICAL DATA:  Status post total knee replacement  EXAM: PORTABLE RIGHT KNEE - 1-2 VIEW  COMPARISON:  Right knee MRI August 19, 2005  FINDINGS: Frontal and lateral views were obtained. Patient is status post total knee replacement. Prosthetic components appear well seated. No acute fracture or dislocation. Extensive air in the knee joint and surrounding soft tissues is an expected postoperative finding.  IMPRESSION: Prosthetic components appear well seated. No acute  fracture or dislocation.    Electronically Signed   By: Lowella Grip III M.D.   On: 02/28/2015 10:56    Disposition: 01-Home or Self Care  Discharge Instructions    Call MD / Call 911    Complete by:  As directed   If you experience chest pain or shortness of breath, CALL 911 and be transported to the hospital emergency room.  If you develope a fever above 101 F, pus (white drainage) or increased drainage or redness at the wound, or calf pain, call your surgeon's office.     Constipation Prevention    Complete by:  As directed   Drink plenty of fluids.  Prune juice may be helpful.  You may use a stool softener, such as Colace (over the counter) 100 mg twice a day.  Use MiraLax (over the counter) for constipation as needed.     Diet - low sodium heart healthy    Complete by:  As directed      Do not put a pillow under the knee. Place it under the heel.    Complete by:  As directed      Driving restrictions    Complete by:  As directed   No driving for 6 weeks     Increase activity slowly as tolerated    Complete by:  As directed      Lifting restrictions    Complete by:  As directed   No lifting for 6 weeks     TED hose    Complete by:  As directed   Use stockings (TED hose) for 2 weeks on both leg(s).  You may remove them at night for sleeping.           Follow-up Information    Follow up with Liv[facility] Hendel, Horald Pollen, MD. Schedule an appointment as soon as possible for a visit in 2 weeks.   Specialty:  Orthopedic Surgery   Why:  For wound re-check   Contact information:   [address] [place] [address] [phone]       Follow up with [facility].   Why:  home health physical therapy   Contact information:   [address] [place] [address] [phone]        Signed: Elie Goody 03/02/2015, 8:14 AM

## 2015-02-28 NOTE — H&P (View-Only) (Signed)
TOTAL KNEE ADMISSION H&P  Patient is being admitted for right total knee arthroplasty.  Subjective:  Chief Complaint:right knee pain.  HPI: Julie [name], 81 y.o. female, has a history of pain and functional disability in the right knee due to arthritis and has failed non-surgical conservative treatments for greater than 12 weeks to includeNSAID's and/or analgesics, corticosteriod injections, flexibility and strengthening excercises, supervised PT with diminished ADL's post treatment, use of assistive devices, weight reduction as appropriate and activity modification.  Onset of symptoms was gradual, starting 10 years ago with gradually worsening course since that time. The patient noted no past surgery on the right knee(s).  Patient currently rates pain in the right knee(s) at 10 out of 10 with activity. Patient has night pain, worsening of pain with activity and weight bearing, pain that interferes with activities of daily living, pain with passive range of motion, crepitus and joint swelling.  Patient has evidence of subchondral cysts, subchondral sclerosis, periarticular osteophytes and joint space narrowing by imaging studies.There is no active infection.  Patient Active Problem List   Diagnosis Date Noted  . Post-traumatic arthritis of ankle 05/10/2014  . MGUS (monoclonal gammopathy of unknown significance) 02/17/2012  . Diverticulosis of colon (without mention of hemorrhage) 01/21/2011  . Family history of malignant neoplasm of gastrointestinal tract 01/21/2011  . Special screening for malignant neoplasms, colon 01/21/2011  . Other symptoms involving digestive system(787.99) 01/21/2011   Past Medical History  Diagnosis Date  . Blood transfusion     no abnormal reaction noted  . Cataract   . Heart murmur   . Hyperlipidemia     but doesn't take any meds   . Interstitial cystitis   . Ulcer 1970    duodenal  . Anxiety     takes Klonopin at bedtime  . GERD (gastroesophageal reflux  disease)     takes Nexium daily  . Hypertension     takes Amlodipine,Metoprolol,and Losartan daily  . Complication of anesthesia     wakes up during surgery  . History of hiatal hernia   . Cataracts, bilateral     immature  . Glaucoma   . Histoplasmosis   . Weakness     numbness in both hands and feet  . Arthritis     takes Methotrexate weekly and Prednisone  . Fibromyalgia     takes Lyrica daily  . Peripheral neuropathy   . Joint pain   . Joint swelling   . History of gout     was taking colchicine  . History of colon polyps   . Urinary frequency   . Urinary urgency   . Hard of hearing   . Nocturia   . Anemia   . Diabetes mellitus without complication     was on Metformin but has been off x 3 yrs  . Depression   . History of shingles     Past Surgical History  Procedure Laterality Date  . Upper gastrointestinal endoscopy    . Colonoscopy    . Vaginal hysterectomy    . Salpingoophorectomy Bilateral   . Cholecystectomy    . Cystocele repair    . Ankle surgery Left   . Arm surgery Left     radius  . Cervical fusion    . Dilation and curettage of uterus    . Bladder tacked     . Accupuncture  3 yrs ago  . Esophagogastroduodenoscopy    . Ankle fusion Left 05/10/2014    Procedure: LEFT ANKLE ARTHRODESIS ;  Surgeon: Wylene Simmer, MD;  Location: [facility];  Service: Orthopedics;  Laterality: Left;     (Not in a hospital admission) Allergies  Allergen Reactions  . Aspirin Other (See Comments)    Duodenal ulcer- cannot take aspirin when active... Does well with 81 mg   . Other     Walnuts-anaphylaxis  . [place] [Trospium Chloride] Nausea And Vomiting    Social History  Substance Use Topics  . Smoking status: Never Smoker   . Smokeless tobacco: Never Used  . Alcohol Use: No     Comment: rarely wine    Family History  Problem Relation Age of Onset  . Colon cancer Sister   . Esophageal cancer Brother   . Breast cancer Mother      Review of Systems   Constitutional: Negative.   HENT: Negative.   Eyes: Negative.   Respiratory: Negative.   Cardiovascular: Negative.   Gastrointestinal: Negative.   Genitourinary: Positive for frequency.  Musculoskeletal: Positive for back pain and joint pain.  Skin: Negative.   Neurological: Negative.   Endo/Heme/Allergies: Negative.   Psychiatric/Behavioral: Negative.     Objective:  Physical Exam  Vitals reviewed. Constitutional: She is oriented to person, place, and time. She appears well-developed and well-nourished.  HENT:  Head: Normocephalic and atraumatic.  Eyes: Conjunctivae and EOM are normal. Pupils are equal, round, and reactive to light.  Neck: Normal range of motion. Neck supple.  Cardiovascular: Normal rate, regular rhythm, normal heart sounds and intact distal pulses.   Respiratory: Effort normal and breath sounds normal. No respiratory distress.  GI: Soft. Bowel sounds are normal. She exhibits no distension. There is no tenderness.  Genitourinary:  deferred  Musculoskeletal:       Right knee: She exhibits swelling and effusion. Tenderness found. Lateral joint line tenderness noted.  Neurological: She is alert and oriented to person, place, and time. She has normal reflexes.  Skin: Skin is warm and dry.  Psychiatric: She has a normal mood and affect. Her behavior is normal. Judgment and thought content normal.    Vital signs in last 24 hours: @VSRANGES @  Labs:   Estimated body mass index is 39.27 kg/(m^2) as calculated from the following:   Height as of 10/24/14: 5\' 4"  (1.626 m).   Weight as of 10/24/14: 103.828 kg (228 lb 14.4 oz).   Imaging Review Plain radiographs demonstrate severe degenerative joint disease of the right knee(s). The overall alignment ismild valgus. The bone quality appears to be adequate for age and reported activity level.  Assessment/Plan:  End stage arthritis, right knee   The patient history, physical examination, clinical judgment of the  provider and imaging studies are consistent with end stage degenerative joint disease of the right knee(s) and total knee arthroplasty is deemed medically necessary. The treatment options including medical management, injection therapy arthroscopy and arthroplasty were discussed at length. The risks and benefits of total knee arthroplasty were presented and reviewed. The risks due to aseptic loosening, infection, stiffness, patella tracking problems, thromboembolic complications and other imponderables were discussed. The patient acknowledged the explanation, agreed to proceed with the plan and consent was signed. Patient is being admitted for inpatient treatment for surgery, pain control, PT, OT, prophylactic antibiotics, VTE prophylaxis, progressive ambulation and ADL's and discharge planning. The patient is planning to be discharged home with home health services

## 2015-02-28 NOTE — Care Management Note (Signed)
Case Management Note  Patient Details  Name: Julie [name] MRN: [id number] Date of Birth: [birth year]  Subjective/Objective:                   RIGHT TOTAL KNEE ARTHROPLASTY (Right) Action/Plan: Discharge planning  Expected Discharge Date:  03/02/15               Expected Discharge Plan:  [place]  In-House Referral:     Discharge planning Services  CM Consult  Post Acute Care Choice:  Home Health Choice offered to:  Patient  DME Arranged:  N/A DME Agency:     HH Arranged:  PT HH Agency:  [facility]  Status of Service:  Completed, signed off  Medicare Important Message Given:    Date Medicare IM Given:    Medicare IM give by:    Date Additional Medicare IM Given:    Additional Medicare Important Message give by:     If discussed at [place] of Stay Meetings, dates discussed:    Additional Comments: CM met with pt in room to offer choice of home health agency.  Pt chooses [facility] to render HHPT.  Address is correct and contact for scheduling will be daughter, [name] [phone].  Pt has both a rolling walker and 3n1 at home.  Referral emailed to [place], Tim with daughter's contact number.  No other CM needs were communicated. Dellie Catholic, RN 02/28/2015, 1:42 PM

## 2015-02-28 NOTE — Evaluation (Signed)
Physical Therapy Evaluation Patient Details Name: Julie [name] MRN: [id number] DOB: 1933/03/30 Today's Date: 02/28/2015   History of Present Illness  R TKR; s/p L ankle fusion, DM, fibromyalgia  Clinical Impression  Pt s/p R TKR presents with decreased R LE strength/ROM and post op pain limiting functional mobility.  Pt should progress to dc home with family assist and HHPT follow up.    Follow Up Recommendations Home health PT    Equipment Recommendations  Rolling walker with 5" wheels (Wide RW "so I can get this big booty inside it")    Recommendations for Other Services OT consult     Precautions / Restrictions Precautions Precautions: Fall;Knee Restrictions Weight Bearing Restrictions: No Other Position/Activity Restrictions: WBAT      Mobility  Bed Mobility Overal bed mobility: Needs Assistance Bed Mobility: Supine to Sit     Supine to sit: Min assist;Mod assist     General bed mobility comments: cues for LE management and use of UEs to self assist  Transfers Overall transfer level: Needs assistance Equipment used: Rolling walker (2 wheeled) Transfers: Sit to/from Stand Sit to Stand: Min assist;Mod assist         General transfer comment: cues for LE management and use of UEs to self assist  Ambulation/Gait Ambulation/Gait assistance: Min assist;Mod assist Ambulation Distance (Feet): 28 Feet Assistive device: Rolling walker (2 wheeled) Gait Pattern/deviations: Step-to pattern;Step-through pattern;Decreased step length - right;Decreased step length - left;Shuffle;Trunk flexed Gait velocity: decr   General Gait Details: cues for sequence, posture and position from [employer]            Wheelchair Mobility    Modified Rankin (Stroke Patients Only)       Balance                                             Pertinent Vitals/Pain Pain Assessment: 0-10 Pain Score: 4  Pain Location: R knee Pain Descriptors / Indicators:  Aching;Sore Pain Intervention(s): Limited activity within patient's tolerance;Monitored during session;Premedicated before session;Ice applied    Home Living Family/patient expects to be discharged to:: Private residence Living Arrangements: Children Available Help at Discharge: Family;Available 24 hours/day Type of Home: Apartment Home Access: Level entry     Home Layout: One level Home Equipment: Cane - single point;Tub bench;Wheelchair - power Additional Comments: Pt lives in senior apt    Prior Function Level of Independence: Independent with assistive device(s)         Comments: ambulated with cane      Hand Dominance   Dominant Hand: Right    Extremity/Trunk Assessment   Upper Extremity Assessment: Overall WFL for tasks assessed           Lower Extremity Assessment: RLE deficits/detail RLE Deficits / Details: Pt able to perform IND SLR    Cervical / Trunk Assessment: Normal  Communication   Communication: No difficulties  Cognition Arousal/Alertness: Awake/alert Behavior During Therapy: WFL for tasks assessed/performed Overall Cognitive Status: Within Functional Limits for tasks assessed                      General Comments      Exercises Total Joint Exercises Ankle Circles/Pumps: AROM;15 reps;Supine;Both      Assessment/Plan    PT Assessment Patient needs continued PT services  PT Diagnosis Difficulty walking   PT Problem List Decreased  strength;Decreased range of motion;Decreased activity tolerance;Decreased mobility;Pain;Decreased knowledge of use of DME;Obesity  PT Treatment Interventions DME instruction;Gait training;Functional mobility training;Therapeutic activities;Therapeutic exercise;Patient/family education   PT Goals (Current goals can be found in the Care Plan section) Acute Rehab PT Goals Patient Stated Goal: Resume previous lifestyle with decreased pain PT Goal Formulation: With patient Time For Goal Achievement:  03/05/15 Potential to Achieve Goals: Good    Frequency 7X/week   Barriers to discharge        Co-evaluation               End of Session Equipment Utilized During Treatment: Gait belt Activity Tolerance: Patient tolerated treatment well Patient left: in chair;with call bell/phone within reach;with nursing/sitter in room;with family/visitor present Nurse Communication: Mobility status         Time: 9147-8295 PT Time Calculation (min) (ACUTE ONLY): 34 min   Charges:   PT Evaluation $Initial PT Evaluation Tier I: 1 Procedure PT Treatments $Gait Training: 8-22 mins   PT G Codes:        Samuele Storey 10-Mar-2015, 5:32 PM

## 2015-02-28 NOTE — Progress Notes (Signed)
Utilization review completed.  

## 2015-02-28 NOTE — Anesthesia Procedure Notes (Signed)
Spinal  End time: 02/28/2015 7:30 AM Staffing Resident/CRNA: Anjalee Cope E Preanesthetic Checklist Completed: patient identified, site marked, surgical consent, pre-op evaluation, timeout performed, IV checked, risks and benefits discussed and monitors and equipment checked Spinal Block Patient position: sitting Prep: Betadine Patient monitoring: heart rate, continuous pulse ox and blood pressure Approach: left paramedian Location: L3-4 Injection technique: single-shot Needle Needle type: Spinocan  Needle gauge: 22 G Needle length: 9 cm Assessment Sensory level: T4 Additional Notes Pt tolerated procedure well. Spinal kit and  Drugs within date. CSF x 3.Negative paresthesia or heme.

## 2015-02-28 NOTE — Transfer of Care (Signed)
Immediate Anesthesia Transfer of Care Note  Patient: Julie [name]  Procedure(s) Performed: Procedure(s): RIGHT TOTAL KNEE ARTHROPLASTY (Right)  Patient Location: PACU  Anesthesia Type:Spinal  Level of Consciousness: awake, alert  and patient cooperative  Airway & Oxygen Therapy: Patient Spontanous Breathing and Patient connected to face mask oxygen  Post-op Assessment: Report given to RN, Post -op Vital signs reviewed and stable and Patient moving all extremities X 4  Post vital signs: stable  Last Vitals:  Filed Vitals:   02/28/15 1017  BP: 146/77  Pulse: 66  Temp: 36.6 C  Resp:     Complications: No apparent anesthesia complicationsSpinal level worn off.

## 2015-02-28 NOTE — Op Note (Signed)
OPERATIVE REPORT  SURGEON: Rod Can, MD.   ASSISTANT: Roberto Scales, RNFA.  PREOPERATIVE DIAGNOSIS: Right knee arthritis.   POSTOPERATIVE DIAGNOSIS: Right knee arthritis.   PROCEDURE: Right total knee arthroplasty.   IMPLANTS: [employer]ker Triathlon CR femur, size 4. Stryker Triathlon Tritanium tibia, size 4. X3 polyethelyene insert, size 9 mm, CR. Tritanium 3 button asymmetric patella, size 32 mm.  ANESTHESIA:  Spinal  TOURNIQUET TIME: not utilized.  ESTIMATED BLOOD LOSS: 500 mL.  ANTIBIOTICS: 2g ancef.  DRAINS: None.  COMPLICATIONS: None   CONDITION: PACU - hemodynamically stable.   BRIEF CLINICAL NOTE: Julie [name] is a 81 y.o. female with a long-standing history of Right knee arthritis. After failing conservative management, the patient was indicated for total knee arthroplasty. The risks, benefits, and alternatives to the procedure were explained, and the patient elected to proceed.  PROCEDURE IN DETAIL: Spinal anesthesia was obtained in the pre-op holding area. Once inside the operative room, a foley catheter was inserted. The patient was then positioned, a nonsterile tourniquet was placed, and the lower extremity was prepped and draped in the normal sterile surgical fashion. A time-out was called verifying side and site of surgery. The patient received IV antibiotics within 60 minutes of beginning the procedure. The tourniquet was not utilized.  An anterior approach to the knee was performed utilizing a midvastus arthrotomy. A medial release was performed and the patellar fat pad was excised. A 6-degree distal femur valgus cut was made with an intramedullary guide. A freehand patellar resection was performed, and the patella was sized an prepared with 3 lug holes.  The proximal tibial resection was performed using an extramedullary guide, leaving a bone island for the PCL. The menisci were excised. A spacer block was placed, and the alignment and balance  in extension were confirmed.   The distal femur was sized using the 3-degree external rotation guide referencing the posterior femoral cortex. The appropriate 4-in-1 cutting block was pinned into place, and the rotation was checked using a spacer block at 90 degrees of flexion. The remaining femoral cuts were performed, taking care to protect the MCL.  The tibia was sized and the trial tray was pinned into place. The remaining trail components were inserted. The knee was stable to varus and valgus stress through a full range of motion. The patella tracked centrally, and the PCL was well balanced. The trial components were removed, and the proximal tibial surface was prepared. Final components were impacted into place. The knee was tested for a final time and found to be well balanced.  The wound was copiously irrigated with a dilute betadine solution followed by normal saline with pulse lavage. Marcaine solution was injected into the periarticular soft tissue. The wound was closed in layers using #1 Vicryl and V-Loc for the fascia, 2-0 Vicryl for the subcutaneous fat, 2-0 Monocryl for the deep dermal layer, 3-0 running Monocryl subcuticular Stitch, and Dermabond for the skin. Once the glue was fully dried, an Aquacell Ag and compressive dressing were applied. The patient was transported to the recovery room in stable ondition. Sponge, needle, and instrument counts were correct at the end of the case x2. The patient tolerated the procedure well and there were no known complications.

## 2015-02-28 NOTE — Interval H&P Note (Signed)
History and Physical Interval Note:  02/28/2015 7:03 AM  Julie [name]  has presented today for surgery, with the diagnosis of RIGHT KNEE OSTEOARTHRITIS  The various methods of treatment have been discussed with the patient and family. After consideration of risks, benefits and other options for treatment, the patient has consented to  Procedure(s): RIGHT TOTAL KNEE ARTHROPLASTY (Right) as a surgical intervention .  The patient's history has been reviewed, patient examined, no change in status, stable for surgery.  I have reviewed the patient's chart and labs.  Questions were answered to the patient's satisfaction.     Kalayah Leske, Horald Pollen

## 2015-03-01 LAB — CBC
HEMATOCRIT: 28.2 % — AB (ref 36.0–46.0)
Hemoglobin: 9.1 g/dL — ABNORMAL LOW (ref 12.0–15.0)
MCH: 28.5 pg (ref 26.0–34.0)
MCHC: 32.3 g/dL (ref 30.0–36.0)
MCV: 88.4 fL (ref 78.0–100.0)
PLATELETS: 225 10*3/uL (ref 150–400)
RBC: 3.19 MIL/uL — ABNORMAL LOW (ref 3.87–5.11)
RDW: 14.3 % (ref 11.5–15.5)
WBC: 10.6 10*3/uL — AB (ref 4.0–10.5)

## 2015-03-01 LAB — BASIC METABOLIC PANEL
ANION GAP: 6 (ref 5–15)
BUN: 24 mg/dL — ABNORMAL HIGH (ref 6–20)
CALCIUM: 8.4 mg/dL — AB (ref 8.9–10.3)
CO2: 26 mmol/L (ref 22–32)
CREATININE: 1.1 mg/dL — AB (ref 0.44–1.00)
Chloride: 108 mmol/L (ref 101–111)
GFR, EST AFRICAN AMERICAN: 53 mL/min — AB (ref >60)
GFR, EST NON AFRICAN AMERICAN: 46 mL/min — AB (ref >60)
GLUCOSE: 132 mg/dL — AB (ref 65–99)
Potassium: 4.3 mmol/L (ref 3.5–5.1)
Sodium: 140 mmol/L (ref 135–145)

## 2015-03-01 MED ORDER — HYDROCODONE-ACETAMINOPHEN 5-325 MG PO TABS: 1.0000 | ORAL_TABLET | ORAL | Status: DC | PRN

## 2015-03-01 MED ORDER — ONDANSETRON HCL 4 MG PO TABS: 4.0000 mg | ORAL_TABLET | Freq: Four times a day (QID) | ORAL | Status: DC | PRN

## 2015-03-01 MED ORDER — SENNA 8.6 MG PO TABS: 2.0000 | ORAL_TABLET | Freq: Every day | ORAL | Status: DC

## 2015-03-01 MED ORDER — APIXABAN 2.5 MG PO TABS: 2.5000 mg | ORAL_TABLET | Freq: Two times a day (BID) | ORAL | Status: DC

## 2015-03-01 MED ORDER — DOCUSATE SODIUM 100 MG PO CAPS: 100.0000 mg | ORAL_CAPSULE | Freq: Two times a day (BID) | ORAL | Status: DC

## 2015-03-01 MED ORDER — OXYCODONE HCL ER 10 MG PO T12A: 10.0000 mg | EXTENDED_RELEASE_TABLET | ORAL | Status: DC

## 2015-03-01 NOTE — Progress Notes (Signed)
Physical Therapy Treatment Patient Details Name: Julie [name] MRN: [id number] DOB: [birth year] Today's Date: 03/01/2015    History of Present Illness R TKR; s/p L ankle fusion, DM, fibromyalgia    PT Comments    Pt motivated and progressing well with mobility.    Follow Up Recommendations  Home health PT     Equipment Recommendations  Rolling walker with 5" wheels    Recommendations for Other Services OT consult     Precautions / Restrictions Precautions Precautions: Fall;Knee Restrictions Weight Bearing Restrictions: No Other Position/Activity Restrictions: WBAT    Mobility  Bed Mobility Overal bed mobility: Needs Assistance Bed Mobility: Supine to Sit     Supine to sit: Min assist     General bed mobility comments: cues for sequence and use of L LE to self assist  Transfers Overall transfer level: Needs assistance Equipment used: Rolling walker (2 wheeled) Transfers: Sit to/from Stand Sit to Stand: Min guard         General transfer comment: cues for LE management and use of UEs to self assist  Ambulation/Gait Ambulation/Gait assistance: Min assist Ambulation Distance (Feet): 75 Feet Assistive device: Rolling walker (2 wheeled) Gait Pattern/deviations: Step-to pattern;Step-through pattern;Decreased step length - right;Decreased step length - left;Shuffle;Trunk flexed Gait velocity: decr   General Gait Details: cues for sequence, posture and position from [employer]            Wheelchair Mobility    Modified Rankin (Stroke Patients Only)       Balance Overall balance assessment: Needs assistance Sitting-balance support: No upper extremity supported;Feet supported Sitting balance-Leahy Scale: Good     Standing balance support: Bilateral upper extremity supported;During functional activity Standing balance-Leahy Scale: Fair                      Cognition Arousal/Alertness: Awake/alert Behavior During Therapy: WFL for  tasks assessed/performed Overall Cognitive Status: Within Functional Limits for tasks assessed                      Exercises Total Joint Exercises Ankle Circles/Pumps: AROM;15 reps;Supine;Both Quad Sets: AROM;Both;10 reps;Supine Heel Slides: AAROM;10 reps;Supine;Right Straight Leg Raises: AAROM;AROM;Right;15 reps;Supine Goniometric ROM: AAROM at R knee -8 - 85    General Comments        Pertinent Vitals/Pain Pain Assessment: 0-10 Pain Score: 5  Pain Location: R knee Pain Descriptors / Indicators: Aching;Sore Pain Intervention(s): Limited activity within patient's tolerance;Monitored during session;Premedicated before session;Ice applied    Home Living Family/patient expects to be discharged to:: Private residence Living Arrangements: Children Available Help at Discharge: Family;Available 24 hours/day Type of Home: Apartment Home Access: Level entry   Home Layout: One level Home Equipment: Cane - single point;Wheelchair - power;Shower seat Additional Comments: Pt lives in senior apt, reports that her shower seat is "broken"    Prior Function Level of Independence: Independent with assistive device(s)      Comments: ambulated with cane    PT Goals (current goals can now be found in the care plan section) Acute Rehab PT Goals Patient Stated Goal: go home soon PT Goal Formulation: With patient Time For Goal Achievement: 03/05/15 Potential to Achieve Goals: Good Progress towards PT goals: Progressing toward goals    Frequency  7X/week    PT Plan Current plan remains appropriate    Co-evaluation             End of Session Equipment Utilized During Treatment: Gait belt Activity  Tolerance: Patient tolerated treatment well Patient left: in chair;with call bell/phone within reach;with family/visitor present     Time: 3646-8032 PT Time Calculation (min) (ACUTE ONLY): 39 min  Charges:  $Gait Training: 23-37 mins $Therapeutic Exercise: 8-22 mins                     G Codes:      Linford Quintela March 12, 2015, 11:15 AM

## 2015-03-01 NOTE — Progress Notes (Signed)
Physical Therapy Treatment Patient Details Name: Julie [name] MRN: [id number] DOB: January 21, 1933 Today's Date: 03/01/2015    History of Present Illness R TKR; s/p L ankle fusion, DM, fibromyalgia    PT Comments    Pt motivated and progressing well with mobility.  Family requests practice step up on curb for entry to apt building - will review in am.  Follow Up Recommendations  Home health PT     Equipment Recommendations  Rolling walker with 5" wheels    Recommendations for Other Services OT consult     Precautions / Restrictions Precautions Precautions: Fall;Knee Restrictions Weight Bearing Restrictions: No Other Position/Activity Restrictions: WBAT    Mobility  Bed Mobility Overal bed mobility: Needs Assistance Bed Mobility: Supine to Sit;Sit to Supine     Supine to sit: Supervision Sit to supine: Min guard   General bed mobility comments: cues for sequence and use of L LE to self assist  Transfers Overall transfer level: Needs assistance Equipment used: Rolling walker (2 wheeled) Transfers: Sit to/from Stand Sit to Stand: Min guard         General transfer comment: cues for LE management and use of UEs to self assist  Ambulation/Gait Ambulation/Gait assistance: Min assist;Min guard Ambulation Distance (Feet): 100 Feet Assistive device: Rolling walker (2 wheeled) Gait Pattern/deviations: Step-to pattern;Step-through pattern;Decreased step length - right;Decreased step length - left;Shuffle;Trunk flexed Gait velocity: decr   General Gait Details: cues for sequence, posture and position from [employer]            Wheelchair Mobility    Modified Rankin (Stroke Patients Only)       Balance Overall balance assessment: Needs assistance Sitting-balance support: No upper extremity supported;Feet supported Sitting balance-Leahy Scale: Good     Standing balance support: Bilateral upper extremity supported;During functional activity Standing  balance-Leahy Scale: Fair                      Cognition Arousal/Alertness: Awake/alert Behavior During Therapy: WFL for tasks assessed/performed Overall Cognitive Status: Within Functional Limits for tasks assessed                      Exercises Total Joint Exercises Ankle Circles/Pumps: AROM;15 reps;Supine;Both Quad Sets: AROM;Both;10 reps;Supine Heel Slides: AAROM;10 reps;Supine;Right Straight Leg Raises: AAROM;AROM;Right;15 reps;Supine Goniometric ROM: AAROM R knee -10- 95    General Comments        Pertinent Vitals/Pain Pain Assessment: 0-10 Pain Score: 5  Pain Location: R knee Pain Descriptors / Indicators: Aching;Sore Pain Intervention(s): Limited activity within patient's tolerance;Monitored during session;Ice applied (Pt declined pain meds per RN)    Home Living Family/patient expects to be discharged to:: Private residence Living Arrangements: Children Available Help at Discharge: Family;Available 24 hours/day Type of Home: Apartment Home Access: Level entry   Home Layout: One level Home Equipment: Cane - single point;Wheelchair - power;Shower seat Additional Comments: Pt lives in senior apt, reports that her shower seat is "broken"    Prior Function Level of Independence: Independent with assistive device(s)      Comments: ambulated with cane    PT Goals (current goals can now be found in the care plan section) Acute Rehab PT Goals Patient Stated Goal: go home soon PT Goal Formulation: With patient Time For Goal Achievement: 03/05/15 Potential to Achieve Goals: Good Progress towards PT goals: Progressing toward goals    Frequency  7X/week    PT Plan Current plan remains appropriate  Co-evaluation             End of Session Equipment Utilized During Treatment: Gait belt Activity Tolerance: Patient tolerated treatment well Patient left: in bed;with call bell/phone within reach;with family/visitor present     Time:  1735-6701 PT Time Calculation (min) (ACUTE ONLY): 50 min  Charges:  $Gait Training: 23-37 mins $Therapeutic Exercise: 8-22 mins                    G Codes:      Aliana Kreischer 03/06/15, 2:32 PM

## 2015-03-01 NOTE — Progress Notes (Signed)
   Subjective:  Patient reports pain as mild to moderate.  Denies N/V/CP/SOB. No c/o.  Objective:   VITALS:   Filed Vitals:   02/28/15 1744 02/28/15 2140 03/01/15 0154 03/01/15 0634  BP: 148/87 126/61 117/81 120/67  Pulse: 72 67 72 74  Temp: 97.9 F (36.6 C) 98.4 F (36.9 C) 98.4 F (36.9 C) 98.5 F (36.9 C)  TempSrc: Oral Axillary Oral Oral  Resp: 18 16  16   Height:      Weight:      SpO2: 99% 100% 99% 100%    Sensation intact distally Intact pulses distally Dorsiflexion/Plantar flexion intact Incision: dressing C/D/I Compartment soft   Lab Results  Component Value Date   WBC 10.6* 03/01/2015   HGB 9.1* 03/01/2015   HCT 28.2* 03/01/2015   MCV 88.4 03/01/2015   PLT 225 03/01/2015   BMET    Component Value Date/Time   NA 140 03/01/2015 0410   NA 143 10/17/2014 0901   K 4.3 03/01/2015 0410   K 4.2 10/17/2014 0901   CL 108 03/01/2015 0410   CL 108* 02/10/2012 0902   CO2 26 03/01/2015 0410   CO2 27 10/17/2014 0901   GLUCOSE 132* 03/01/2015 0410   GLUCOSE 101 10/17/2014 0901   GLUCOSE 94 02/10/2012 0902   BUN 24* 03/01/2015 0410   BUN 22.9 10/17/2014 0901   CREATININE 1.10* 03/01/2015 0410   CREATININE 1.1 10/17/2014 0901   CALCIUM 8.4* 03/01/2015 0410   CALCIUM 10.1 10/17/2014 0901   GFRNONAA 46* 03/01/2015 0410   GFRAA 53* 03/01/2015 0410     Assessment/Plan: 1 Day Post-Op   Principal Problem:   Primary osteoarthritis of right knee  WBAT with walker Apixiban, foot pumps, TEDs Advance diet Up with therapy D/C IV fluids Discharge home with home health today vs tomorrow    Tierre Netto, Horald Pollen 03/01/2015, 9:57 AM   Rod Can, MD Cell [phone]

## 2015-03-01 NOTE — Discharge Instructions (Signed)
°Dr. Brian Swinteck °Total Joint Specialist °[facility] °[address] °[place], [place] [address] °[phone] ° °TOTAL KNEE REPLACEMENT POSTOPERATIVE DIRECTIONS ° ° ° °Knee Rehabilitation, Guidelines Following Surgery  °Results after knee surgery are often greatly improved when you follow the exercise, range of motion and muscle strengthening exercises prescribed by your doctor. Safety measures are also important to protect the knee from further injury. Any time any of these exercises cause you to have increased pain or swelling in your knee joint, decrease the amount until you are comfortable again and slowly increase them. If you have problems or questions, call your caregiver or physical therapist for advice.  ° °WEIGHT BEARING °Weight bearing as tolerated with assist device (walker, cane, etc) as directed, use it as long as suggested by your surgeon or therapist, typically at least 4-6 weeks. ° °HOME CARE INSTRUCTIONS  °Remove items at home which could result in a fall. This includes throw rugs or furniture in walking pathways.  °Continue medications as instructed at time of discharge. °You may have some home medications which will be placed on hold until you complete the course of blood thinner medication.  °You may start showering once you are discharged home but do not submerge the incision under water. Just pat the incision dry and apply a dry gauze dressing on daily. °Walk with walker as instructed.  °You may resume a sexual relationship in one month or when given the OK by your doctor.  °· Use walker as long as suggested by your caregivers. °· Avoid periods of inactivity such as sitting longer than an hour when not asleep. This helps prevent blood clots.  °You may put full weight on your legs and walk as much as is comfortable.  °You may return to work once you are cleared by your doctor.  °Do not drive a car for 6 weeks or until released by you surgeon.  °· Do not drive while  taking narcotics.  °Wear the elastic stockings for three weeks following surgery during the day but you may remove then at night. °Make sure you keep all of your appointments after your operation with all of your doctors and caregivers. You should call the office at the above phone number and make an appointment for approximately two weeks after the date of your surgery. °Do not remove your surgical dressing. The dressing is waterproof; you may take showers in 3 days, but do not take tub baths or submerge the dressing. °Please pick up a stool softener and laxative for home use as long as you are requiring pain medications. °· ICE to the affected knee every three hours for 30 minutes at a time and then as needed for pain and swelling.  Continue to use ice on the knee for pain and swelling from surgery. You may notice swelling that will progress down to the foot and ankle.  This is normal after surgery.  Elevate the leg when you are not up walking on it.   °It is important for you to complete the blood thinner medication as prescribed by your doctor. °· Continue to use the breathing machine which will help keep your temperature down.  It is common for your temperature to cycle up and down following surgery, especially at night when you are not up moving around and exerting yourself.  The breathing machine keeps your lungs expanded and your temperature down. ° °RANGE OF MOTION AND STRENGTHENING EXERCISES  °Rehabilitation of the knee is important following a   knee injury or an operation. After just a few days of immobilization, the muscles of the thigh which control the knee become weakened and shrink (atrophy). Knee exercises are designed to build up the tone and strength of the thigh muscles and to improve knee motion. Often times heat used for twenty to thirty minutes before working out will loosen up your tissues and help with improving the range of motion but do not use heat for the first two weeks following  surgery. These exercises can be done on a training (exercise) mat, on the floor, on a table or on a bed. Use what ever works the best and is most comfortable for you Knee exercises include:  Leg Lifts - While your knee is still immobilized in a splint or cast, you can do straight leg raises. Lift the leg to 60 degrees, hold for 3 sec, and slowly lower the leg. Repeat 10-20 times 2-3 times daily. Perform this exercise against resistance later as your knee gets better.  Quad and Hamstring Sets - Tighten up the muscle on the front of the thigh (Quad) and hold for 5-10 sec. Repeat this 10-20 times hourly. Hamstring sets are done by pushing the foot backward against an object and holding for 5-10 sec. Repeat as with quad sets.  A rehabilitation program following serious knee injuries can speed recovery and prevent re-injury in the future due to weakened muscles. Contact your doctor or a physical therapist for more information on knee rehabilitation.   SKILLED REHAB INSTRUCTIONS: If the patient is transferred to a skilled rehab facility following release from the hospital, a list of the current medications will be sent to the facility for the patient to continue.  When discharged from the skilled rehab facility, please have the facility set up the patient's [place] prior to being released. Also, the skilled facility will be responsible for providing the patient with their medications at time of release from the facility to include their pain medication, the muscle relaxants, and their blood thinner medication. If the patient is still at the rehab facility at time of the two week follow up appointment, the skilled rehab facility will also need to assist the patient in arranging follow up appointment in our office and any transportation needs.  MAKE SURE YOU:  Understand these instructions.  Will watch your condition.  Will get help right away if you are not doing well or get worse.     Pick up stool softner and laxative for home use following surgery while on pain medications. Do NOT remove your dressing. You may shower.  Do not take tub baths or submerge incision under water. May shower starting three days after surgery. Please use a clean towel to pat the incision dry following showers. Continue to use ice for pain and swelling after surgery. Do not use any lotions or creams on the incision until instructed by your surgeon.   Information on my medicine - ELIQUIS (apixaban)  This medication education was reviewed with me or my healthcare representative as part of my discharge preparation.  The pharmacist that spoke with me during my hospital stay was:  Clovis Riley, [facility]  Why was Eliquis prescribed for you? Eliquis was prescribed for you to reduce the risk of blood clots forming after orthopedic surgery.    What do You need to know about Eliquis? Take your Eliquis TWICE DAILY - one tablet in the morning and one tablet in the evening with or without food.  It would be best to take the dose about the same time each day.  If you have difficulty swallowing the tablet whole please discuss with your pharmacist how to take the medication safely.  Take Eliquis exactly as prescribed by your doctor and DO NOT stop taking Eliquis without talking to the doctor who prescribed the medication.  Stopping without other medication to take the place of Eliquis may increase your risk of developing a clot.  After discharge, you should have regular check-up appointments with your healthcare provider that is prescribing your Eliquis.  What do you do if you miss a dose? If a dose of ELIQUIS is not taken at the scheduled time, take it as soon as possible on the same day and twice-daily administration should be resumed.  The dose should not be doubled to make up for a missed dose.  Do not take more than one tablet of ELIQUIS at the same time.  Important Safety  Information A possible side effect of Eliquis is bleeding. You should call your healthcare provider right away if you experience any of the following: ? Bleeding from an injury or your nose that does not stop. ? Unusual colored urine (red or dark brown) or unusual colored stools (red or black). ? Unusual bruising for unknown reasons. ? A serious fall or if you hit your head (even if there is no bleeding).  Some medicines may interact with Eliquis and might increase your risk of bleeding or clotting while on Eliquis. To help avoid this, consult your healthcare provider or pharmacist prior to using any new prescription or non-prescription medications, including herbals, vitamins, non-steroidal anti-inflammatory drugs (NSAIDs) and supplements.  This website has more information on Eliquis (apixaban): http://[email or web address].com/eliquis/home

## 2015-03-01 NOTE — Evaluation (Signed)
Occupational Therapy Evaluation Patient Details Name: Julie [name] MRN: [id number] DOB: Apr 06, 1933 Today's Date: 03/01/2015    History of Present Illness R TKR; s/p L ankle fusion, DM, fibromyalgia   Clinical Impression   Patient admitted with above. Patient independent to mod I PTA. Patient currently functioning at an overall supervision level. Pt reports her daughter will be present for supervision/assistance prn, daughter present during OT eval/treat. D/C from acute OT services and no additional follow-up OT needs at this time. All appropriate education provided to patient and family (daughter). Please re-order OT if needed.      Follow Up Recommendations  No OT follow up;Supervision - Intermittent    Equipment Recommendations  3 in 1 bedside comode    Recommendations for Other Services  None at this time    Precautions / Restrictions Precautions Precautions: Fall;Knee Restrictions Weight Bearing Restrictions: Yes Other Position/Activity Restrictions: WBAT    Mobility Bed Mobility General bed mobility comments: Pt found seated in recliner upon OT entering/exiting room  Transfers Overall transfer level: Needs assistance Equipment used: Rolling walker (2 wheeled) Transfers: Sit to/from Stand Sit to Stand: Supervision General transfer comment: Supervision for safety, cues for hand placement and technique    Balance Overall balance assessment: Needs assistance Sitting-balance support: No upper extremity supported;Feet supported Sitting balance-Leahy Scale: Good     Standing balance support: Bilateral upper extremity supported;During functional activity Standing balance-Leahy Scale: Fair    ADL Overall ADL's : Needs assistance/impaired General ADL Comments: Pt overall supervision for ADLs and functional mobility using RW. Pt able to easily reach BLEs for LB ADLs. Pt reports that she has a "broken" shower seat at home. Educated pt on how to use 3-in-1 over toilet, in  [place] (with handout given), and even beside bed prn.     Pertinent Vitals/Pain Pain Assessment: 0-10 Pain Score: 4  Pain Location: right knee Pain Descriptors / Indicators: Sore Pain Intervention(s): Monitored during session;Repositioned;Ice applied     Hand Dominance Right   Extremity/Trunk Assessment Upper Extremity Assessment Upper Extremity Assessment: Overall WFL for tasks assessed   Lower Extremity Assessment Lower Extremity Assessment: Defer to PT evaluation   Cervical / Trunk Assessment Cervical / Trunk Assessment: Normal   Communication Communication Communication: No difficulties   Cognition Arousal/Alertness: Awake/alert Behavior During Therapy: WFL for tasks assessed/performed Overall Cognitive Status: Within Functional Limits for tasks assessed              Home Living Family/patient expects to be discharged to:: Private residence Living Arrangements: Children Available Help at Discharge: Family;Available 24 hours/day Type of Home: Apartment Home Access: Level entry     Home Layout: One level     Bathroom Shower/Tub: Tub/shower unit;Curtain   Bathroom Toilet: Handicapped height     Home Equipment: Cane - single point;Wheelchair - power;Shower seat   Additional Comments: Pt lives in senior apt, reports that her shower seat is "broken"      Prior Functioning/Environment Level of Independence: Independent with assistive device(s)  Comments: ambulated with cane     OT Diagnosis: Generalized weakness;Acute pain   OT Problem List:  n/a, no more acute OT needs   OT Treatment/Interventions:   n/a, no more acute OT needs   OT Goals(Current goals can be found in the care plan section) Acute Rehab OT Goals Patient Stated Goal: go home soon OT Goal Formulation: All assessment and education complete, DC therapy  OT Frequency:   n/a, no more acute OT needs   Barriers to D/C:  None at this time    End of Session Equipment Utilized During  Treatment: Gait belt;Rolling walker  Activity Tolerance: Patient tolerated treatment well Patient left: in chair;with call bell/phone within reach;with family/visitor present   Time: 5501-5868 OT Time Calculation (min): 25 min Charges:  OT General Charges $OT Visit: 1 Procedure OT Evaluation $Initial OT Evaluation Tier I: 1 Procedure OT Treatments $Self Care/Home Management : 8-22 mins  Julie [name] , MS, OTR/L, CLT Pager: [phone]  03/01/2015, 10:41 AM

## 2015-03-01 NOTE — Progress Notes (Signed)
Spoke with pt who does have RW but it is not WIDE RW which she needs. Called Lecretia with [facility] and ordered with confirmation. [phone])

## 2015-03-02 LAB — CBC
HEMATOCRIT: 24.3 % — AB (ref 36.0–46.0)
HEMOGLOBIN: 7.7 g/dL — AB (ref 12.0–15.0)
MCH: 28 pg (ref 26.0–34.0)
MCHC: 31.7 g/dL (ref 30.0–36.0)
MCV: 88.4 fL (ref 78.0–100.0)
PLATELETS: 189 10*3/uL (ref 150–400)
RBC: 2.75 MIL/uL — AB (ref 3.87–5.11)
RDW: 14.9 % (ref 11.5–15.5)
WBC: 8.5 10*3/uL (ref 4.0–10.5)

## 2015-03-02 NOTE — Progress Notes (Signed)
Physical Therapy Treatment Patient Details Name: CALEA HRIBAR MRN: [id number] DOB: [birth year] Today's Date: 03/02/2015    History of Present Illness R TKR; s/p L ankle fusion, DM, fibromyalgia    PT Comments    Pt progressing well with mobility and eager for return home.  Reviewed therex and stairs with pt and dtrs  Follow Up Recommendations  Home health PT     Equipment Recommendations  Rolling walker with 5" wheels    Recommendations for Other Services OT consult     Precautions / Restrictions Precautions Precautions: Fall;Knee Restrictions Weight Bearing Restrictions: No Other Position/Activity Restrictions: WBAT    Mobility  Bed Mobility Overal bed mobility: Needs Assistance Bed Mobility: Supine to Sit;Sit to Supine     Supine to sit: Supervision Sit to supine: Supervision   General bed mobility comments: cues for sequence and use of L LE to self assist  Transfers Overall transfer level: Needs assistance Equipment used: Rolling walker (2 wheeled) Transfers: Sit to/from Stand Sit to Stand: Min guard;Supervision            Ambulation/Gait Ambulation/Gait assistance: Min guard;Supervision Ambulation Distance (Feet): 70 Feet Assistive device: Rolling walker (2 wheeled) Gait Pattern/deviations: Step-to pattern;Step-through pattern;Decreased step length - right;Decreased step length - left;Shuffle;Trunk flexed Gait velocity: decr   General Gait Details: min cues for sequence, posture and position from RW   Stairs Stairs: Yes Stairs assistance: Min assist Stair Management: No rails;Step to pattern;Backwards;With walker Number of Stairs: 2 (single step twice) General stair comments: cues for sequence and foot/RW placement.  Pt dtrs observiing  Wheelchair Mobility    Modified Rankin (Stroke Patients Only)       Balance                                    Cognition Arousal/Alertness: Awake/alert Behavior During Therapy: WFL  for tasks assessed/performed Overall Cognitive Status: Within Functional Limits for tasks assessed                      Exercises Total Joint Exercises Ankle Circles/Pumps: AROM;15 reps;Supine;Both Quad Sets: AROM;Both;Supine;15 reps Heel Slides: AAROM;Supine;Right;15 reps Straight Leg Raises: AAROM;AROM;Right;15 reps;Supine Goniometric ROM: AAROM -8 - 100    General Comments        Pertinent Vitals/Pain Pain Assessment: 0-10 Pain Score: 4  Pain Location: R knee Pain Descriptors / Indicators: Aching;Sore Pain Intervention(s): Limited activity within patient's tolerance;Monitored during session;Premedicated before session;Ice applied    Home Living                      Prior Function            PT Goals (current goals can now be found in the care plan section) Acute Rehab PT Goals Patient Stated Goal: go home soon PT Goal Formulation: With patient Time For Goal Achievement: 03/05/15 Potential to Achieve Goals: Good Progress towards PT goals: Progressing toward goals    Frequency  7X/week    PT Plan Current plan remains appropriate    Co-evaluation             End of Session Equipment Utilized During Treatment: Gait belt Activity Tolerance: Patient tolerated treatment well Patient left: in bed;with call bell/phone within reach;with family/visitor present     Time: 3524-8185 PT Time Calculation (min) (ACUTE ONLY): 38 min  Charges:  $Gait Training: 8-22 mins $Therapeutic Exercise: 8-22 mins $Therapeutic Activity:  8-22 mins                    G Codes:      Agatha Duplechain 2015-03-13, 12:28 PM

## 2015-03-02 NOTE — Progress Notes (Signed)
Patient discharged home with daughters, discharge instructions reviewed along with prescriptions.  Patient and family verbalized understanding.  Lorrinda Ramstad RN

## 2015-03-02 NOTE — Progress Notes (Signed)
   Subjective:  Patient reports pain as mild to moderate.  Denies N/V/CP/SOB. No c/o.  Objective:   VITALS:   Filed Vitals:   03/01/15 1809 03/01/15 2149 03/02/15 0117 03/02/15 0437  BP: 98/52 127/64 129/71 120/54  Pulse: 78 76 78 80  Temp: 99.3 F (37.4 C) 97.7 F (36.5 C) 97.9 F (36.6 C) 98.4 F (36.9 C)  TempSrc: Oral Oral Oral Oral  Resp: 16 16 16 17   Height:      Weight:      SpO2: 95% 98% 94% 94%    Sensation intact distally Intact pulses distally Dorsiflexion/Plantar flexion intact Incision: dressing C/D/I Compartment soft   Lab Results  Component Value Date   WBC 8.5 03/02/2015   HGB 7.7* 03/02/2015   HCT 24.3* 03/02/2015   MCV 88.4 03/02/2015   PLT 189 03/02/2015   BMET    Component Value Date/Time   NA 140 03/01/2015 0410   NA 143 10/17/2014 0901   K 4.3 03/01/2015 0410   K 4.2 10/17/2014 0901   CL 108 03/01/2015 0410   CL 108* 02/10/2012 0902   CO2 26 03/01/2015 0410   CO2 27 10/17/2014 0901   GLUCOSE 132* 03/01/2015 0410   GLUCOSE 101 10/17/2014 0901   GLUCOSE 94 02/10/2012 0902   BUN 24* 03/01/2015 0410   BUN 22.9 10/17/2014 0901   CREATININE 1.10* 03/01/2015 0410   CREATININE 1.1 10/17/2014 0901   CALCIUM 8.4* 03/01/2015 0410   CALCIUM 10.1 10/17/2014 0901   GFRNONAA 46* 03/01/2015 0410   GFRAA 53* 03/01/2015 0410     Assessment/Plan: 2 Days Post-Op   Principal Problem:   Primary osteoarthritis of right knee  WBAT with walker Apixiban, foot pumps, TEDs Up with therapy Discharge home with home health     Carle Dargan, Horald Pollen 03/02/2015, 8:14 AM   Rod Can, MD Cell [phone]

## 2015-03-03 DIAGNOSIS — M12572 Traumatic arthropathy, left ankle and foot: Secondary | ICD-10-CM | POA: Diagnosis not present

## 2015-03-03 DIAGNOSIS — G629 Polyneuropathy, unspecified: Secondary | ICD-10-CM | POA: Diagnosis not present

## 2015-03-03 DIAGNOSIS — E119 Type 2 diabetes mellitus without complications: Secondary | ICD-10-CM | POA: Diagnosis not present

## 2015-03-03 DIAGNOSIS — M797 Fibromyalgia: Secondary | ICD-10-CM | POA: Diagnosis not present

## 2015-03-03 DIAGNOSIS — Z471 Aftercare following joint replacement surgery: Secondary | ICD-10-CM | POA: Diagnosis not present

## 2015-03-03 DIAGNOSIS — M1990 Unspecified osteoarthritis, unspecified site: Secondary | ICD-10-CM | POA: Diagnosis not present

## 2015-03-05 DIAGNOSIS — M12572 Traumatic arthropathy, left ankle and foot: Secondary | ICD-10-CM | POA: Diagnosis not present

## 2015-03-05 DIAGNOSIS — E119 Type 2 diabetes mellitus without complications: Secondary | ICD-10-CM | POA: Diagnosis not present

## 2015-03-05 DIAGNOSIS — G629 Polyneuropathy, unspecified: Secondary | ICD-10-CM | POA: Diagnosis not present

## 2015-03-05 DIAGNOSIS — M1990 Unspecified osteoarthritis, unspecified site: Secondary | ICD-10-CM | POA: Diagnosis not present

## 2015-03-05 DIAGNOSIS — M797 Fibromyalgia: Secondary | ICD-10-CM | POA: Diagnosis not present

## 2015-03-05 DIAGNOSIS — Z471 Aftercare following joint replacement surgery: Secondary | ICD-10-CM | POA: Diagnosis not present

## 2015-03-07 DIAGNOSIS — M797 Fibromyalgia: Secondary | ICD-10-CM | POA: Diagnosis not present

## 2015-03-07 DIAGNOSIS — Z471 Aftercare following joint replacement surgery: Secondary | ICD-10-CM | POA: Diagnosis not present

## 2015-03-07 DIAGNOSIS — M12572 Traumatic arthropathy, left ankle and foot: Secondary | ICD-10-CM | POA: Diagnosis not present

## 2015-03-07 DIAGNOSIS — M1990 Unspecified osteoarthritis, unspecified site: Secondary | ICD-10-CM | POA: Diagnosis not present

## 2015-03-07 DIAGNOSIS — E119 Type 2 diabetes mellitus without complications: Secondary | ICD-10-CM | POA: Diagnosis not present

## 2015-03-07 DIAGNOSIS — G629 Polyneuropathy, unspecified: Secondary | ICD-10-CM | POA: Diagnosis not present

## 2015-03-11 DIAGNOSIS — E119 Type 2 diabetes mellitus without complications: Secondary | ICD-10-CM | POA: Diagnosis not present

## 2015-03-11 DIAGNOSIS — Z471 Aftercare following joint replacement surgery: Secondary | ICD-10-CM | POA: Diagnosis not present

## 2015-03-11 DIAGNOSIS — G629 Polyneuropathy, unspecified: Secondary | ICD-10-CM | POA: Diagnosis not present

## 2015-03-11 DIAGNOSIS — M797 Fibromyalgia: Secondary | ICD-10-CM | POA: Diagnosis not present

## 2015-03-11 DIAGNOSIS — M1990 Unspecified osteoarthritis, unspecified site: Secondary | ICD-10-CM | POA: Diagnosis not present

## 2015-03-11 DIAGNOSIS — M12572 Traumatic arthropathy, left ankle and foot: Secondary | ICD-10-CM | POA: Diagnosis not present

## 2015-03-13 DIAGNOSIS — Z7901 Long term (current) use of anticoagulants: Secondary | ICD-10-CM | POA: Diagnosis not present

## 2015-03-13 DIAGNOSIS — E119 Type 2 diabetes mellitus without complications: Secondary | ICD-10-CM | POA: Diagnosis not present

## 2015-03-13 DIAGNOSIS — I10 Essential (primary) hypertension: Secondary | ICD-10-CM | POA: Diagnosis not present

## 2015-03-13 DIAGNOSIS — Z96651 Presence of right artificial knee joint: Secondary | ICD-10-CM | POA: Diagnosis not present

## 2015-03-13 DIAGNOSIS — Z471 Aftercare following joint replacement surgery: Secondary | ICD-10-CM | POA: Diagnosis not present

## 2015-03-14 DIAGNOSIS — M797 Fibromyalgia: Secondary | ICD-10-CM | POA: Diagnosis not present

## 2015-03-14 DIAGNOSIS — M1990 Unspecified osteoarthritis, unspecified site: Secondary | ICD-10-CM | POA: Diagnosis not present

## 2015-03-14 DIAGNOSIS — Z471 Aftercare following joint replacement surgery: Secondary | ICD-10-CM | POA: Diagnosis not present

## 2015-03-14 DIAGNOSIS — M12572 Traumatic arthropathy, left ankle and foot: Secondary | ICD-10-CM | POA: Diagnosis not present

## 2015-03-14 DIAGNOSIS — E119 Type 2 diabetes mellitus without complications: Secondary | ICD-10-CM | POA: Diagnosis not present

## 2015-03-14 DIAGNOSIS — G629 Polyneuropathy, unspecified: Secondary | ICD-10-CM | POA: Diagnosis not present

## 2015-03-18 DIAGNOSIS — M1711 Unilateral primary osteoarthritis, right knee: Secondary | ICD-10-CM | POA: Diagnosis not present

## 2015-03-22 DIAGNOSIS — M1711 Unilateral primary osteoarthritis, right knee: Secondary | ICD-10-CM | POA: Diagnosis not present

## 2015-03-29 DIAGNOSIS — M1711 Unilateral primary osteoarthritis, right knee: Secondary | ICD-10-CM | POA: Diagnosis not present

## 2015-04-03 DIAGNOSIS — M1711 Unilateral primary osteoarthritis, right knee: Secondary | ICD-10-CM | POA: Diagnosis not present

## 2015-04-05 DIAGNOSIS — M1711 Unilateral primary osteoarthritis, right knee: Secondary | ICD-10-CM | POA: Diagnosis not present

## 2015-04-08 DIAGNOSIS — M1711 Unilateral primary osteoarthritis, right knee: Secondary | ICD-10-CM | POA: Diagnosis not present

## 2015-04-10 DIAGNOSIS — Z471 Aftercare following joint replacement surgery: Secondary | ICD-10-CM | POA: Diagnosis not present

## 2015-04-10 DIAGNOSIS — Z96651 Presence of right artificial knee joint: Secondary | ICD-10-CM | POA: Diagnosis not present

## 2015-04-17 DIAGNOSIS — S9031XA Contusion of right foot, initial encounter: Secondary | ICD-10-CM | POA: Diagnosis not present

## 2015-04-17 DIAGNOSIS — I10 Essential (primary) hypertension: Secondary | ICD-10-CM | POA: Diagnosis not present

## 2015-04-17 DIAGNOSIS — E119 Type 2 diabetes mellitus without complications: Secondary | ICD-10-CM | POA: Diagnosis not present

## 2015-04-17 DIAGNOSIS — E559 Vitamin D deficiency, unspecified: Secondary | ICD-10-CM | POA: Diagnosis not present

## 2015-04-24 DIAGNOSIS — E119 Type 2 diabetes mellitus without complications: Secondary | ICD-10-CM | POA: Diagnosis not present

## 2015-04-24 DIAGNOSIS — K219 Gastro-esophageal reflux disease without esophagitis: Secondary | ICD-10-CM | POA: Diagnosis not present

## 2015-04-24 DIAGNOSIS — D81818 Other biotin-dependent carboxylase deficiency: Secondary | ICD-10-CM | POA: Diagnosis not present

## 2015-04-24 DIAGNOSIS — I10 Essential (primary) hypertension: Secondary | ICD-10-CM | POA: Diagnosis not present

## 2015-05-01 DIAGNOSIS — L821 Other seborrheic keratosis: Secondary | ICD-10-CM | POA: Diagnosis not present

## 2015-05-01 DIAGNOSIS — L905 Scar conditions and fibrosis of skin: Secondary | ICD-10-CM | POA: Diagnosis not present

## 2015-06-20 DIAGNOSIS — D485 Neoplasm of uncertain behavior of skin: Secondary | ICD-10-CM | POA: Diagnosis not present

## 2015-06-20 DIAGNOSIS — L720 Epidermal cyst: Secondary | ICD-10-CM | POA: Diagnosis not present

## 2015-06-25 DIAGNOSIS — M0579 Rheumatoid arthritis with rheumatoid factor of multiple sites without organ or systems involvement: Secondary | ICD-10-CM | POA: Diagnosis not present

## 2015-08-07 DIAGNOSIS — M0579 Rheumatoid arthritis with rheumatoid factor of multiple sites without organ or systems involvement: Secondary | ICD-10-CM | POA: Diagnosis not present

## 2015-08-07 DIAGNOSIS — M6281 Muscle weakness (generalized): Secondary | ICD-10-CM | POA: Diagnosis not present

## 2015-08-12 DIAGNOSIS — E559 Vitamin D deficiency, unspecified: Secondary | ICD-10-CM | POA: Diagnosis not present

## 2015-08-12 DIAGNOSIS — E119 Type 2 diabetes mellitus without complications: Secondary | ICD-10-CM | POA: Diagnosis not present

## 2015-08-12 DIAGNOSIS — I10 Essential (primary) hypertension: Secondary | ICD-10-CM | POA: Diagnosis not present

## 2015-08-19 DIAGNOSIS — I10 Essential (primary) hypertension: Secondary | ICD-10-CM | POA: Diagnosis not present

## 2015-08-19 DIAGNOSIS — H6690 Otitis media, unspecified, unspecified ear: Secondary | ICD-10-CM | POA: Diagnosis not present

## 2015-08-19 DIAGNOSIS — E119 Type 2 diabetes mellitus without complications: Secondary | ICD-10-CM | POA: Diagnosis not present

## 2015-08-19 DIAGNOSIS — E7800 Pure hypercholesterolemia, unspecified: Secondary | ICD-10-CM | POA: Diagnosis not present

## 2015-09-03 ENCOUNTER — Ambulatory Visit (INDEPENDENT_AMBULATORY_CARE_PROVIDER_SITE_OTHER): Payer: Self-pay | Admitting: Neurology

## 2015-09-03 ENCOUNTER — Encounter: Payer: Self-pay | Admitting: Neurology

## 2015-09-03 ENCOUNTER — Ambulatory Visit (INDEPENDENT_AMBULATORY_CARE_PROVIDER_SITE_OTHER): Payer: Medicare Other | Admitting: Neurology

## 2015-09-03 DIAGNOSIS — D472 Monoclonal gammopathy: Secondary | ICD-10-CM

## 2015-09-03 DIAGNOSIS — G609 Hereditary and idiopathic neuropathy, unspecified: Secondary | ICD-10-CM | POA: Diagnosis not present

## 2015-09-03 NOTE — Procedures (Signed)
HISTORY:   Julie [name] is an 82 year old patient with a history of rheumatoid arthritis, diabetes, and an MGUS who has a one-year history of some difficulty with leg weakness and numbness in the legs and feet. The patient denies any low back pain. She is being evaluated for a possible peripheral neuropathy.  NERVE CONDUCTION STUDIES:  Nerve conduction studies were performed on the left upper extremity. The distal motor latency for the left median nerve was prolonged, with a normal motor amplitude. The distal motor latencies and motor amplitudes for the left ulnar nerve were normal. The F wave latencies and nerve conduction velocities for the left median and ulnar nerves were normal, with normal sensory latencies for these nerves.  Nerve conduction studies were performed on both lower extremities. The distal motor latency for the right peroneal nerve was normal, but with a low motor amplitude. No response was seen on the left peroneal nerve. The distal motor latencies for the posterior tibial nerves were normal on the right, prolonged on the left, with low motor amplitudes with these nerves bilaterally. The nerve conduction velocities for the right peroneal and for the posterior tibial nerves bilaterally were normal. The H reflex latencies were unobtainable bilaterally. The sural and peroneal sensory latencies were unobtainable bilaterally.  EMG STUDIES:  EMG study was performed on the left lower extremity:  The tibialis anterior muscle reveals 2 to 4K motor units with full recruitment. No fibrillations or positive waves were seen. The peroneus tertius muscle reveals 2 to 4K motor units with full recruitment. No fibrillations or positive waves were seen. The medial gastrocnemius muscle reveals 1 to 3K motor units with full recruitment. No fibrillations or positive waves were seen. The vastus lateralis muscle reveals 2 to 4K motor units with full recruitment. No fibrillations or positive  waves were seen. The iliopsoas muscle reveals 2 to 4K motor units with full recruitment. No fibrillations or positive waves were seen. The biceps femoris muscle (long head) reveals 2 to 4K motor units with full recruitment. No fibrillations or positive waves were seen. The lumbosacral paraspinal muscles were tested at 3 levels, and revealed no abnormalities of insertional activity at all 3 levels tested. There was good relaxation.  EMG study was performed on the right lower extremity:  The tibialis anterior muscle reveals 2 to 4K motor units with full recruitment. No fibrillations or positive waves were seen. The peroneus tertius muscle reveals 2 to 4K motor units with full recruitment. No fibrillations or positive waves were seen. The medial gastrocnemius muscle reveals 1 to 3K motor units with full recruitment. No fibrillations or positive waves were seen. The vastus lateralis muscle reveals 2 to 4K motor units with full recruitment. No fibrillations or positive waves were seen. The iliopsoas muscle reveals 2 to 4K motor units with full recruitment. No fibrillations or positive waves were seen. The biceps femoris muscle (long head) reveals 2 to 4K motor units with full recruitment. No fibrillations or positive waves were seen. The lumbosacral paraspinal muscles were tested at 3 levels, and revealed no abnormalities of insertional activity at all 3 levels tested. There was good relaxation.   IMPRESSION:  Nerve conduction studies done on the left upper extremity and on both lower extremities suggests a primarily axonal peripheral neuropathy of moderate to severe severity. EMG evaluations of both lower extremities were relatively unremarkable, however, suggesting that the degree of severity of the peripheral neuropathy is less than what is suggested by nerve conduction study. There is essentially  no acute or chronic denervation seen by EMG. There is no evidence of an overlying lumbosacral  radiculopathy on either side.  Jill Alexanders MD 09/03/2015 1:43 PM  [facility] [address] [place], [place] [address]  Phone [phone] Fax [phone]

## 2015-09-03 NOTE — Progress Notes (Signed)
Please refer to EMG and nerve conduction study procedure note. 

## 2015-09-13 ENCOUNTER — Other Ambulatory Visit: Payer: Self-pay

## 2015-09-13 DIAGNOSIS — Z1231 Encounter for screening mammogram for malignant neoplasm of breast: Secondary | ICD-10-CM

## 2015-09-20 DIAGNOSIS — L820 Inflamed seborrheic keratosis: Secondary | ICD-10-CM | POA: Diagnosis not present

## 2015-09-20 DIAGNOSIS — L720 Epidermal cyst: Secondary | ICD-10-CM | POA: Diagnosis not present

## 2015-10-08 ENCOUNTER — Ambulatory Visit
Admission: RE | Admit: 2015-10-08 | Discharge: 2015-10-08 | Disposition: A | Discharge status: Home / Self Care | Payer: Medicare Other | Source: Ambulatory Visit

## 2015-10-08 DIAGNOSIS — Z1231 Encounter for screening mammogram for malignant neoplasm of breast: Secondary | ICD-10-CM

## 2015-10-08 DIAGNOSIS — M0579 Rheumatoid arthritis with rheumatoid factor of multiple sites without organ or systems involvement: Secondary | ICD-10-CM | POA: Diagnosis not present

## 2015-10-08 DIAGNOSIS — M6281 Muscle weakness (generalized): Secondary | ICD-10-CM | POA: Diagnosis not present

## 2015-10-09 ENCOUNTER — Other Ambulatory Visit: Payer: Self-pay | Admitting: Internal Medicine

## 2015-10-09 DIAGNOSIS — R928 Other abnormal and inconclusive findings on diagnostic imaging of breast: Secondary | ICD-10-CM

## 2015-10-14 ENCOUNTER — Telehealth: Payer: Self-pay | Admitting: Internal Medicine

## 2015-10-14 NOTE — Telephone Encounter (Signed)
pt called to r/s appt....pt ok and aware of new d.t °

## 2015-10-15 ENCOUNTER — Ambulatory Visit
Admission: RE | Admit: 2015-10-15 | Discharge: 2015-10-15 | Disposition: A | Discharge status: Home / Self Care | Payer: Medicare Other | Source: Ambulatory Visit | Attending: Internal Medicine | Admitting: Internal Medicine

## 2015-10-15 ENCOUNTER — Other Ambulatory Visit: Payer: Self-pay | Admitting: Internal Medicine

## 2015-10-15 DIAGNOSIS — R928 Other abnormal and inconclusive findings on diagnostic imaging of breast: Secondary | ICD-10-CM

## 2015-10-15 DIAGNOSIS — N63 Unspecified lump in breast: Secondary | ICD-10-CM | POA: Diagnosis not present

## 2015-10-18 ENCOUNTER — Other Ambulatory Visit: Payer: Self-pay | Admitting: Internal Medicine

## 2015-10-18 DIAGNOSIS — R928 Other abnormal and inconclusive findings on diagnostic imaging of breast: Secondary | ICD-10-CM

## 2015-10-22 ENCOUNTER — Ambulatory Visit
Admission: RE | Admit: 2015-10-22 | Discharge: 2015-10-22 | Disposition: A | Discharge status: Home / Self Care | Payer: Medicare Other | Source: Ambulatory Visit | Attending: Internal Medicine | Admitting: Internal Medicine

## 2015-10-22 DIAGNOSIS — C50412 Malignant neoplasm of upper-outer quadrant of left female breast: Secondary | ICD-10-CM | POA: Diagnosis not present

## 2015-10-22 DIAGNOSIS — N63 Unspecified lump in breast: Secondary | ICD-10-CM | POA: Diagnosis not present

## 2015-10-22 DIAGNOSIS — R928 Other abnormal and inconclusive findings on diagnostic imaging of breast: Secondary | ICD-10-CM

## 2015-10-23 ENCOUNTER — Other Ambulatory Visit: Payer: Medicare Other

## 2015-10-24 HISTORY — PX: BREAST LUMPECTOMY: SHX2

## 2015-10-30 ENCOUNTER — Other Ambulatory Visit: Payer: Self-pay | Admitting: General Surgery

## 2015-10-30 ENCOUNTER — Ambulatory Visit: Payer: Medicare Other | Admitting: Internal Medicine

## 2015-10-30 DIAGNOSIS — C50412 Malignant neoplasm of upper-outer quadrant of left female breast: Secondary | ICD-10-CM

## 2015-10-30 DIAGNOSIS — C50419 Malignant neoplasm of upper-outer quadrant of unspecified female breast: Secondary | ICD-10-CM | POA: Insufficient documentation

## 2015-11-11 ENCOUNTER — Other Ambulatory Visit (HOSPITAL_BASED_OUTPATIENT_CLINIC_OR_DEPARTMENT_OTHER): Payer: Medicare Other

## 2015-11-11 ENCOUNTER — Encounter (HOSPITAL_BASED_OUTPATIENT_CLINIC_OR_DEPARTMENT_OTHER): Payer: Self-pay | Admitting: *Deleted

## 2015-11-11 DIAGNOSIS — D472 Monoclonal gammopathy: Secondary | ICD-10-CM

## 2015-11-11 LAB — COMPREHENSIVE METABOLIC PANEL
ALBUMIN: 3.6 g/dL (ref 3.5–5.0)
ALK PHOS: 61 U/L (ref 40–150)
ALT: 15 U/L (ref 0–55)
ANION GAP: 8 meq/L (ref 3–11)
AST: 20 U/L (ref 5–34)
BILIRUBIN TOTAL: 0.59 mg/dL (ref 0.20–1.20)
BUN: 33.2 mg/dL — ABNORMAL HIGH (ref 7.0–26.0)
CO2: 30 mEq/L — ABNORMAL HIGH (ref 22–29)
Calcium: 9.7 mg/dL (ref 8.4–10.4)
Chloride: 108 mEq/L (ref 98–109)
Creatinine: 1.1 mg/dL (ref 0.6–1.1)
EGFR: 57 mL/min/{1.73_m2} — AB (ref >90)
GLUCOSE: 84 mg/dL (ref 70–140)
Potassium: 4.3 mEq/L (ref 3.5–5.1)
Sodium: 145 mEq/L (ref 136–145)
TOTAL PROTEIN: 6.9 g/dL (ref 6.4–8.3)

## 2015-11-11 LAB — CBC WITH DIFFERENTIAL/PLATELET
BASO%: 1.2 % (ref 0.0–2.0)
BASOS ABS: 0.1 10*3/uL (ref 0.0–0.1)
EOS ABS: 0.2 10*3/uL (ref 0.0–0.5)
EOS%: 3.6 % (ref 0.0–7.0)
HEMATOCRIT: 33.6 % — AB (ref 34.8–46.6)
HEMOGLOBIN: 10.8 g/dL — AB (ref 11.6–15.9)
LYMPH#: 1 10*3/uL (ref 0.9–3.3)
LYMPH%: 20.9 % (ref 14.0–49.7)
MCH: 27.2 pg (ref 25.1–34.0)
MCHC: 32.1 g/dL (ref 31.5–36.0)
MCV: 84.9 fL (ref 79.5–101.0)
MONO#: 0.4 10*3/uL (ref 0.1–0.9)
MONO%: 7.9 % (ref 0.0–14.0)
NEUT#: 3.3 10*3/uL (ref 1.5–6.5)
NEUT%: 66.4 % (ref 38.4–76.8)
Platelets: 213 10*3/uL (ref 145–400)
RBC: 3.95 10*6/uL (ref 3.70–5.45)
RDW: 20 % — AB (ref 11.2–14.5)
WBC: 5 10*3/uL (ref 3.9–10.3)

## 2015-11-11 LAB — LACTATE DEHYDROGENASE: LDH: 239 U/L (ref 125–245)

## 2015-11-12 LAB — KAPPA/LAMBDA LIGHT CHAINS
IG KAPPA FREE LIGHT CHAIN: 46.8 mg/L — AB (ref 3.3–19.4)
IG LAMBDA FREE LIGHT CHAIN: 12.7 mg/L (ref 5.7–26.3)
Kappa/Lambda FluidC Ratio: 3.69 — ABNORMAL HIGH (ref 0.26–1.65)

## 2015-11-12 LAB — IGG, IGA, IGM
IGA/IMMUNOGLOBULIN A, SERUM: 713 mg/dL — AB (ref 64–422)
IGG (IMMUNOGLOBIN G), SERUM: 659 mg/dL — AB (ref 700–1600)
IgM, Qn, Serum: 41 mg/dL (ref 26–217)

## 2015-11-12 LAB — BETA 2 MICROGLOBULIN, SERUM: Beta-2: 2.4 mg/L (ref 0.6–2.4)

## 2015-11-13 ENCOUNTER — Other Ambulatory Visit: Payer: Medicare Other

## 2015-11-13 ENCOUNTER — Ambulatory Visit
Admission: RE | Admit: 2015-11-13 | Discharge: 2015-11-13 | Disposition: A | Discharge status: Home / Self Care | Payer: Medicare Other | Source: Ambulatory Visit | Attending: General Surgery | Admitting: General Surgery

## 2015-11-13 DIAGNOSIS — C50412 Malignant neoplasm of upper-outer quadrant of left female breast: Secondary | ICD-10-CM

## 2015-11-13 DIAGNOSIS — C50912 Malignant neoplasm of unspecified site of left female breast: Secondary | ICD-10-CM | POA: Diagnosis not present

## 2015-11-14 ENCOUNTER — Ambulatory Visit (HOSPITAL_BASED_OUTPATIENT_CLINIC_OR_DEPARTMENT_OTHER)
Admission: RE | Admit: 2015-11-14 | Discharge: 2015-11-14 | Disposition: A | Discharge status: Home / Self Care | Payer: Medicare Other | Source: Ambulatory Visit | Attending: General Surgery | Admitting: General Surgery

## 2015-11-14 ENCOUNTER — Ambulatory Visit (HOSPITAL_BASED_OUTPATIENT_CLINIC_OR_DEPARTMENT_OTHER): Payer: Medicare Other | Admitting: Certified Registered"

## 2015-11-14 ENCOUNTER — Encounter (HOSPITAL_BASED_OUTPATIENT_CLINIC_OR_DEPARTMENT_OTHER)
Admission: RE | Disposition: A | Discharge status: Home / Self Care | Payer: Self-pay | Source: Ambulatory Visit | Attending: General Surgery

## 2015-11-14 ENCOUNTER — Encounter (HOSPITAL_COMMUNITY): Payer: Medicare Other

## 2015-11-14 ENCOUNTER — Ambulatory Visit (HOSPITAL_COMMUNITY)
Admission: RE | Admit: 2015-11-14 | Discharge: 2015-11-14 | Disposition: A | Discharge status: Home / Self Care | Payer: Medicare Other | Source: Ambulatory Visit | Attending: General Surgery | Admitting: General Surgery

## 2015-11-14 ENCOUNTER — Ambulatory Visit
Admission: RE | Admit: 2015-11-14 | Discharge: 2015-11-14 | Disposition: A | Discharge status: Home / Self Care | Payer: Medicare Other | Source: Ambulatory Visit | Attending: General Surgery | Admitting: General Surgery

## 2015-11-14 ENCOUNTER — Encounter (HOSPITAL_BASED_OUTPATIENT_CLINIC_OR_DEPARTMENT_OTHER): Payer: Self-pay | Admitting: Certified Registered"

## 2015-11-14 DIAGNOSIS — M1990 Unspecified osteoarthritis, unspecified site: Secondary | ICD-10-CM | POA: Diagnosis not present

## 2015-11-14 DIAGNOSIS — F329 Major depressive disorder, single episode, unspecified: Secondary | ICD-10-CM | POA: Insufficient documentation

## 2015-11-14 DIAGNOSIS — Z886 Allergy status to analgesic agent status: Secondary | ICD-10-CM | POA: Insufficient documentation

## 2015-11-14 DIAGNOSIS — M797 Fibromyalgia: Secondary | ICD-10-CM | POA: Insufficient documentation

## 2015-11-14 DIAGNOSIS — Z90710 Acquired absence of both cervix and uterus: Secondary | ICD-10-CM | POA: Diagnosis not present

## 2015-11-14 DIAGNOSIS — C50912 Malignant neoplasm of unspecified site of left female breast: Secondary | ICD-10-CM | POA: Insufficient documentation

## 2015-11-14 DIAGNOSIS — C50412 Malignant neoplasm of upper-outer quadrant of left female breast: Secondary | ICD-10-CM

## 2015-11-14 DIAGNOSIS — R079 Chest pain, unspecified: Secondary | ICD-10-CM | POA: Diagnosis not present

## 2015-11-14 DIAGNOSIS — Z79899 Other long term (current) drug therapy: Secondary | ICD-10-CM | POA: Insufficient documentation

## 2015-11-14 DIAGNOSIS — Z6838 Body mass index (BMI) 38.0-38.9, adult: Secondary | ICD-10-CM | POA: Insufficient documentation

## 2015-11-14 DIAGNOSIS — Z888 Allergy status to other drugs, medicaments and biological substances status: Secondary | ICD-10-CM | POA: Insufficient documentation

## 2015-11-14 DIAGNOSIS — E669 Obesity, unspecified: Secondary | ICD-10-CM | POA: Insufficient documentation

## 2015-11-14 DIAGNOSIS — E119 Type 2 diabetes mellitus without complications: Secondary | ICD-10-CM | POA: Insufficient documentation

## 2015-11-14 DIAGNOSIS — K219 Gastro-esophageal reflux disease without esophagitis: Secondary | ICD-10-CM | POA: Diagnosis not present

## 2015-11-14 DIAGNOSIS — R928 Other abnormal and inconclusive findings on diagnostic imaging of breast: Secondary | ICD-10-CM | POA: Diagnosis not present

## 2015-11-14 DIAGNOSIS — I10 Essential (primary) hypertension: Secondary | ICD-10-CM | POA: Insufficient documentation

## 2015-11-14 DIAGNOSIS — F419 Anxiety disorder, unspecified: Secondary | ICD-10-CM | POA: Diagnosis not present

## 2015-11-14 DIAGNOSIS — Z881 Allergy status to other antibiotic agents status: Secondary | ICD-10-CM | POA: Insufficient documentation

## 2015-11-14 DIAGNOSIS — G8918 Other acute postprocedural pain: Secondary | ICD-10-CM | POA: Diagnosis not present

## 2015-11-14 HISTORY — PX: RADIOACTIVE SEED GUIDED PARTIAL MASTECTOMY WITH AXILLARY SENTINEL LYMPH NODE BIOPSY: SHX6520

## 2015-11-14 LAB — GLUCOSE, CAPILLARY
Glucose-Capillary: 88 mg/dL (ref 65–99)
Glucose-Capillary: 110 mg/dL — ABNORMAL HIGH (ref 65–99)

## 2015-11-14 SURGERY — RADIOACTIVE SEED GUIDED PARTIAL MASTECTOMY WITH AXILLARY SENTINEL LYMPH NODE BIOPSY
Anesthesia: Regional | Site: Breast | Laterality: Left

## 2015-11-14 MED ORDER — LIDOCAINE 2% (20 MG/ML) 5 ML SYRINGE
INTRAMUSCULAR | Status: DC | PRN
  Administered 2015-11-14: 60 mg via INTRAVENOUS

## 2015-11-14 MED ORDER — ACETAMINOPHEN 500 MG PO TABS
ORAL_TABLET | ORAL | Status: AC
  Filled 2015-11-14: qty 2

## 2015-11-14 MED ORDER — PROPOFOL 10 MG/ML IV BOLUS
INTRAVENOUS | Status: DC | PRN
  Administered 2015-11-14: 180 mg via INTRAVENOUS

## 2015-11-14 MED ORDER — TECHNETIUM TC 99M SULFUR COLLOID FILTERED
1.0000 | Freq: Once | INTRAVENOUS | Status: AC | PRN
  Administered 2015-11-14: 1 via INTRADERMAL

## 2015-11-14 MED ORDER — PROPOFOL 500 MG/50ML IV EMUL
INTRAVENOUS | Status: AC
  Filled 2015-11-14: qty 50

## 2015-11-14 MED ORDER — HYDROCODONE-ACETAMINOPHEN 10-325 MG PO TABS: 1.0000 | ORAL_TABLET | Freq: Four times a day (QID) | ORAL | Status: DC | PRN

## 2015-11-14 MED ORDER — OXYCODONE HCL 5 MG PO TABS: 5.0000 mg | ORAL_TABLET | Freq: Once | ORAL | Status: DC | PRN

## 2015-11-14 MED ORDER — LACTATED RINGERS IV SOLN
INTRAVENOUS | Status: DC
  Administered 2015-11-14 (×2): via INTRAVENOUS

## 2015-11-14 MED ORDER — FENTANYL CITRATE (PF) 100 MCG/2ML IJ SOLN
INTRAMUSCULAR | Status: AC
  Filled 2015-11-14: qty 2

## 2015-11-14 MED ORDER — ALBUTEROL SULFATE HFA 108 (90 BASE) MCG/ACT IN AERS
INHALATION_SPRAY | RESPIRATORY_TRACT | Status: DC | PRN
  Administered 2015-11-14: 2 via RESPIRATORY_TRACT

## 2015-11-14 MED ORDER — FENTANYL CITRATE (PF) 100 MCG/2ML IJ SOLN
50.0000 ug | INTRAMUSCULAR | Status: DC | PRN
  Administered 2015-11-14: 100 ug via INTRAVENOUS

## 2015-11-14 MED ORDER — SCOPOLAMINE 1 MG/3DAYS TD PT72: 1.0000 | MEDICATED_PATCH | Freq: Once | TRANSDERMAL | Status: DC | PRN

## 2015-11-14 MED ORDER — ROPIVACAINE HCL 5 MG/ML IJ SOLN
INTRAMUSCULAR | Status: DC | PRN
  Administered 2015-11-14: 20 mL via PERINEURAL

## 2015-11-14 MED ORDER — PHENYLEPHRINE 40 MCG/ML (10ML) SYRINGE FOR IV PUSH (FOR BLOOD PRESSURE SUPPORT)
PREFILLED_SYRINGE | INTRAVENOUS | Status: AC
  Filled 2015-11-14: qty 10

## 2015-11-14 MED ORDER — MIDAZOLAM HCL 2 MG/2ML IJ SOLN
1.0000 mg | INTRAMUSCULAR | Status: DC | PRN
  Administered 2015-11-14: 1 mg via INTRAVENOUS

## 2015-11-14 MED ORDER — CEFAZOLIN SODIUM-DEXTROSE 2-4 GM/100ML-% IV SOLN
INTRAVENOUS | Status: AC
  Filled 2015-11-14: qty 100

## 2015-11-14 MED ORDER — PHENYLEPHRINE HCL 10 MG/ML IJ SOLN
INTRAMUSCULAR | Status: AC
  Filled 2015-11-14: qty 1

## 2015-11-14 MED ORDER — BUPIVACAINE HCL (PF) 0.25 % IJ SOLN
INTRAMUSCULAR | Status: DC | PRN
  Administered 2015-11-14: 7 mL

## 2015-11-14 MED ORDER — PHENYLEPHRINE HCL 10 MG/ML IJ SOLN
INTRAMUSCULAR | Status: DC | PRN
  Administered 2015-11-14 (×3): 80 ug via INTRAVENOUS

## 2015-11-14 MED ORDER — SODIUM CHLORIDE 0.9 % IJ SOLN
INTRAVENOUS | Status: DC | PRN
  Administered 2015-11-14: 5 mL

## 2015-11-14 MED ORDER — GLYCOPYRROLATE 0.2 MG/ML IJ SOLN: 0.2000 mg | Freq: Once | INTRAMUSCULAR | Status: DC | PRN

## 2015-11-14 MED ORDER — ONDANSETRON HCL 4 MG/2ML IJ SOLN
INTRAMUSCULAR | Status: DC | PRN
  Administered 2015-11-14: 4 mg via INTRAVENOUS

## 2015-11-14 MED ORDER — OXYCODONE HCL 5 MG/5ML PO SOLN: 5.0000 mg | Freq: Once | ORAL | Status: DC | PRN

## 2015-11-14 MED ORDER — ONDANSETRON HCL 4 MG/2ML IJ SOLN
INTRAMUSCULAR | Status: AC
  Filled 2015-11-14: qty 2

## 2015-11-14 MED ORDER — ACETAMINOPHEN 500 MG PO TABS
1000.0000 mg | ORAL_TABLET | ORAL | Status: AC
  Administered 2015-11-14: 1000 mg via ORAL

## 2015-11-14 MED ORDER — FENTANYL CITRATE (PF) 100 MCG/2ML IJ SOLN
25.0000 ug | INTRAMUSCULAR | Status: DC | PRN
  Administered 2015-11-14 (×2): 50 ug via INTRAVENOUS

## 2015-11-14 MED ORDER — GABAPENTIN 300 MG PO CAPS
300.0000 mg | ORAL_CAPSULE | ORAL | Status: AC
  Administered 2015-11-14: 300 mg via ORAL

## 2015-11-14 MED ORDER — ONDANSETRON HCL 4 MG/2ML IJ SOLN: 4.0000 mg | Freq: Four times a day (QID) | INTRAMUSCULAR | Status: DC | PRN

## 2015-11-14 MED ORDER — CEFAZOLIN SODIUM-DEXTROSE 2-4 GM/100ML-% IV SOLN
2.0000 g | INTRAVENOUS | Status: AC
  Administered 2015-11-14: 2 g via INTRAVENOUS

## 2015-11-14 MED ORDER — GABAPENTIN 300 MG PO CAPS
ORAL_CAPSULE | ORAL | Status: AC
  Filled 2015-11-14: qty 1

## 2015-11-14 MED ORDER — MIDAZOLAM HCL 2 MG/2ML IJ SOLN
INTRAMUSCULAR | Status: AC
  Filled 2015-11-14: qty 2

## 2015-11-14 MED ORDER — LIDOCAINE 2% (20 MG/ML) 5 ML SYRINGE
INTRAMUSCULAR | Status: AC
  Filled 2015-11-14: qty 5

## 2015-11-14 SURGICAL SUPPLY — 59 items
APPLIER CLIP 9.375 MED OPEN (MISCELLANEOUS) ×3
BINDER BREAST LRG (GAUZE/BANDAGES/DRESSINGS) IMPLANT
BINDER BREAST MEDIUM (GAUZE/BANDAGES/DRESSINGS) IMPLANT
BINDER BREAST XLRG (GAUZE/BANDAGES/DRESSINGS) IMPLANT
BINDER BREAST XXLRG (GAUZE/BANDAGES/DRESSINGS) ×3 IMPLANT
BLADE SURG 15 STRL LF DISP TIS (BLADE) ×1 IMPLANT
BLADE SURG 15 STRL SS (BLADE) ×2
CANISTER SUC SOCK COL 7IN (MISCELLANEOUS) IMPLANT
CANISTER SUCT 1200ML W/VALVE (MISCELLANEOUS) IMPLANT
CHLORAPREP W/TINT 26ML (MISCELLANEOUS) ×3 IMPLANT
CLIP APPLIE 9.375 MED OPEN (MISCELLANEOUS) ×1 IMPLANT
CLOSURE WOUND 1/2 X4 (GAUZE/BANDAGES/DRESSINGS) ×1
COVER BACK TABLE 60X90IN (DRAPES) ×3 IMPLANT
COVER MAYO STAND STRL (DRAPES) ×3 IMPLANT
COVER PROBE W GEL 5X96 (DRAPES) ×3 IMPLANT
DECANTER SPIKE VIAL GLASS SM (MISCELLANEOUS) IMPLANT
DEVICE DUBIN W/COMP PLATE 8390 (MISCELLANEOUS) ×3 IMPLANT
DRAPE LAPAROSCOPIC ABDOMINAL (DRAPES) ×3 IMPLANT
DRAPE UTILITY XL STRL (DRAPES) ×3 IMPLANT
ELECT COATED BLADE 2.86 ST (ELECTRODE) ×3 IMPLANT
ELECT REM PT RETURN 9FT ADLT (ELECTROSURGICAL) ×3
ELECTRODE REM PT RTRN 9FT ADLT (ELECTROSURGICAL) ×1 IMPLANT
GLOVE BIO SURGEON STRL SZ7 (GLOVE) ×6 IMPLANT
GLOVE BIOGEL PI IND STRL 7.5 (GLOVE) ×1 IMPLANT
GLOVE BIOGEL PI INDICATOR 7.5 (GLOVE) ×2
GOWN STRL REUS W/ TWL LRG LVL3 (GOWN DISPOSABLE) ×2 IMPLANT
GOWN STRL REUS W/TWL LRG LVL3 (GOWN DISPOSABLE) ×4
HEMOSTAT ARISTA ABSORB 3G PWDR (MISCELLANEOUS) ×3 IMPLANT
ILLUMINATOR WAVEGUIDE N/F (MISCELLANEOUS) IMPLANT
KIT MARKER MARGIN INK (KITS) ×3 IMPLANT
LIGHT WAVEGUIDE WIDE FLAT (MISCELLANEOUS) IMPLANT
LIQUID BAND (GAUZE/BANDAGES/DRESSINGS) ×3 IMPLANT
NDL SAFETY ECLIPSE 18X1.5 (NEEDLE) IMPLANT
NEEDLE HYPO 18GX1.5 SHARP (NEEDLE)
NEEDLE HYPO 25X1 1.5 SAFETY (NEEDLE) ×3 IMPLANT
NS IRRIG 1000ML POUR BTL (IV SOLUTION) IMPLANT
PACK BASIN DAY SURGERY FS (CUSTOM PROCEDURE TRAY) ×3 IMPLANT
PENCIL BUTTON HOLSTER BLD 10FT (ELECTRODE) ×3 IMPLANT
SHEET MEDIUM DRAPE 40X70 STRL (DRAPES) IMPLANT
SLEEVE SCD COMPRESS KNEE MED (MISCELLANEOUS) ×3 IMPLANT
SPONGE LAP 4X18 X RAY DECT (DISPOSABLE) ×3 IMPLANT
STOCKINETTE IMPERVIOUS LG (DRAPES) IMPLANT
STRIP CLOSURE SKIN 1/2X4 (GAUZE/BANDAGES/DRESSINGS) ×2 IMPLANT
SUT ETHILON 2 0 FS 18 (SUTURE) IMPLANT
SUT MNCRL AB 4-0 PS2 18 (SUTURE) ×3 IMPLANT
SUT MON AB 5-0 PS2 18 (SUTURE) ×3 IMPLANT
SUT SILK 2 0 SH (SUTURE) IMPLANT
SUT VIC AB 2-0 SH 27 (SUTURE) ×4
SUT VIC AB 2-0 SH 27XBRD (SUTURE) ×2 IMPLANT
SUT VIC AB 3-0 SH 27 (SUTURE) ×2
SUT VIC AB 3-0 SH 27X BRD (SUTURE) ×1 IMPLANT
SUT VIC AB 5-0 PS2 18 (SUTURE) IMPLANT
SYR CONTROL 10ML LL (SYRINGE) ×3 IMPLANT
TAPE STRIPS DRAPE STRL (GAUZE/BANDAGES/DRESSINGS) ×3 IMPLANT
TOWEL OR 17X24 6PK STRL BLUE (TOWEL DISPOSABLE) ×3 IMPLANT
TOWEL OR NON WOVEN STRL DISP B (DISPOSABLE) ×3 IMPLANT
TUBE CONNECTING 20'X1/4 (TUBING)
TUBE CONNECTING 20X1/4 (TUBING) IMPLANT
YANKAUER SUCT BULB TIP NO VENT (SUCTIONS) IMPLANT

## 2015-11-14 NOTE — Op Note (Signed)
Preoperative diagnosis: left breast cancer, clinical stage I Postoperative diagnosis: same as above Procedure: 1. Left breast seed guided lumpectomy 2. Left axillary sn biopsy 3. Injection of blue dye for sentinel node identification Surgeon Dr Serita Grammes Anes general with pectoral block EBL: minimal Comps none Specimen:  1. Left breast marked with paint 2. Sentinel nodes with highest count of 10 Sponge count correct at completion dispo to recovery stable  Indications: This is a 29 yof who has newly diagnosed left breast cancer.  We discussed proceeding with lumpectomy/sn biopsy. She had radioactive seed placed prior to beginning. I had these mm in the OR.   Procedure: After informed consent was obtained the patient was taken to the OR. She was injected with technetium in the standard periareolar fashion. She underwent a pectoral block. She was given ancef. SCDs were in place. She was prepped and draped in the standard sterile surgical fashion. A timeout was performed.I could not identify much in the way of technetium in the axilla so I elected to inject methylene blue dye in the periareolar position I then made a periareolar incision in the left breast.I then removed the seed with an attempt to get a clear margin.  I did confirm removal of the clip and seed with mammography.the deep margin is the pectoralis muscle. I had pathology look at specimen grossly and Dr Orene Desanctis thought I was well clear of the margins.  I placed clips in the cavity. I then closed this with 2-0 vicryl, 3-0 vicryl and 5-0 monocryl.  I placed glue on this wound. I then made an axillary incision I went through the axillary fascia. I was able to locate what appeared to be a sentinel node with highest count listed above. There were no more palpable, blue or radioactive nodes present. There wasn't really much radioactivity at all in the axilla so will need to see final pathology.  I obtained hemostasis. I then  closed the fascia with 2-0 vicryl. I placed arista in the entire cavity. The skin was closed with 3-0 vicryl and 4-0 monocryl. Glue and steristrips were placed A breast binder was placed. She was extubated and transferred to recovery stable

## 2015-11-14 NOTE — Anesthesia Procedure Notes (Addendum)
Anesthesia Regional Block:  Pectoralis block  Pre-Anesthetic Checklist: ,, timeout performed, Correct Patient, Correct Site, Correct Laterality, Correct Procedure, Correct Position, site marked, Risks and benefits discussed,  Surgical consent,  Pre-op evaluation,  At surgeon's request and post-op pain management  Laterality: Left  Prep: chloraprep       Needles:  Injection technique: Single-shot  Needle Type: Echogenic Needle     Needle Length: 9cm 9 cm Needle Gauge: 21 and 21 G    Additional Needles:  Procedures: ultrasound guided (picture in chart) Pectoralis block Narrative:  Start time: 11/14/2015 9:11 AM End time: 11/14/2015 9:20 AM Injection made incrementally with aspirations every 5 mL.  Performed by: Personally  Anesthesiologist: HODIERNE, ADAM  Additional Notes: Pt tolerated the procedure well.   Procedure Name: LMA Insertion Date/Time: 11/14/2015 10:17 AM Performed by: Baxter Flattery Pre-anesthesia Checklist: Patient identified, Emergency Drugs available, Suction available and Patient being monitored Patient Re-evaluated:Patient Re-evaluated prior to inductionOxygen Delivery Method: Circle system utilized Preoxygenation: Pre-oxygenation with 100% oxygen Intubation Type: IV induction Ventilation: Mask ventilation without difficulty LMA: LMA inserted LMA Size: 4.0 Number of attempts: 1 Airway Equipment and Method: Bite block Placement Confirmation: positive ETCO2 and breath sounds checked- equal and bilateral Tube secured with: Tape Dental Injury: Teeth and Oropharynx as per pre-operative assessment

## 2015-11-14 NOTE — Anesthesia Postprocedure Evaluation (Signed)
Anesthesia Post Note  Patient: Julie [name]  Procedure(s) Performed: Procedure(s) (LRB): RADIOACTIVE SEED GUIDED LEFT BREAST LUMPECTOMY WITH AXILLARY SENTINEL LYMPH NODE BIOPSY (Left)  Patient location during evaluation: PACU Anesthesia Type: General Level of consciousness: awake and alert and patient cooperative Pain management: pain level controlled Vital Signs Assessment: post-procedure vital signs reviewed and stable Respiratory status: spontaneous breathing and respiratory function stable Cardiovascular status: stable Anesthetic complications: no    Last Vitals:  Filed Vitals:   11/14/15 1200 11/14/15 1232  BP: 143/69 160/69  Pulse: 57 60  Temp:  36.6 C  Resp: 20 20    Last Pain:  Filed Vitals:   11/14/15 1233  PainSc: 0-No pain                 [name] S

## 2015-11-14 NOTE — Anesthesia Preprocedure Evaluation (Signed)
Anesthesia Evaluation  Patient identified by MRN, date of birth, ID band Patient awake    Reviewed: Allergy & Precautions, NPO status , Patient's Chart, lab work & pertinent test results  History of Anesthesia Complications (+) PONV  Airway Mallampati: II   Neck ROM: full    Dental   Pulmonary    breath sounds clear to auscultation       Cardiovascular hypertension,  Rhythm:regular Rate:Normal     Neuro/Psych Anxiety Depression  Neuromuscular disease    GI/Hepatic hiatal hernia, GERD  ,  Endo/Other  diabetes, Type 2obese  Renal/GU      Musculoskeletal  (+) Arthritis , Fibromyalgia -  Abdominal   Peds  Hematology   Anesthesia Other Findings   Reproductive/Obstetrics                             Anesthesia Physical Anesthesia Plan  ASA: III  Anesthesia Plan: General and Regional   Post-op Pain Management:  Regional for Post-op pain   Induction: Intravenous  Airway Management Planned: LMA  Additional Equipment:   Intra-op Plan:   Post-operative Plan:   Informed Consent: I have reviewed the patients History and Physical, chart, labs and discussed the procedure including the risks, benefits and alternatives for the proposed anesthesia with the patient or authorized representative who has indicated his/her understanding and acceptance.     Plan Discussed with: CRNA, Anesthesiologist and Surgeon  Anesthesia Plan Comments:         Anesthesia Quick Evaluation

## 2015-11-14 NOTE — Discharge Instructions (Signed)
[facility] °Office Phone Number [phone] ° °POST OP INSTRUCTIONS ° °Always review your discharge instruction sheet given to you by the facility where your surgery was performed. ° °IF YOU HAVE DISABILITY OR FAMILY LEAVE FORMS, YOU MUST BRING THEM TO THE OFFICE FOR PROCESSING.  DO NOT GIVE THEM TO YOUR DOCTOR. ° °1. A prescription for pain medication may be given to you upon discharge.  Take your pain medication as prescribed, if needed.  If narcotic pain medicine is not needed, then you may take acetaminophen (Tylenol), naprosyn (Alleve) or ibuprofen (Advil) as needed. °2. Take your usually prescribed medications unless otherwise directed °3. If you need a refill on your pain medication, please contact your pharmacy.  They will contact our office to request authorization.  Prescriptions will not be filled after 5pm or on week-ends. °4. You should eat very light the first 24 hours after surgery, such as soup, crackers, pudding, etc.  Resume your normal diet the day after surgery. °5. Most patients will experience some swelling and bruising in the breast.  Ice packs and a good support bra will help.  Wear the breast binder provided or a sports bra for 72 hours day and night.  After that wear a sports bra during the day until you return to the office. Swelling and bruising can take several days to resolve.  °6. It is common to experience some constipation if taking pain medication after surgery.  Increasing fluid intake and taking a stool softener will usually help or prevent this problem from occurring.  A mild laxative (Milk of Magnesia or Miralax) should be taken according to package directions if there are no bowel movements after 48 hours. °7. Unless discharge instructions indicate otherwise, you may remove your bandages 48 hours after surgery and you may shower at that time.  You may have steri-strips (small skin tapes) in place directly over the incision.  These strips should be left on the  skin for 7-10 days and will come off on their own.  If your surgeon used skin glue on the incision, you may shower in 24 hours.  The glue will flake off over the next 2-3 weeks.  Any sutures or staples will be removed at the office during your follow-up visit. °8. ACTIVITIES:  You may resume regular daily activities (gradually increasing) beginning the next day.  Wearing a good support bra or sports bra minimizes pain and swelling.  You may have sexual intercourse when it is comfortable. °a. You may drive when you no longer are taking prescription pain medication, you can comfortably wear a seatbelt, and you can safely maneuver your car and apply brakes. °b. RETURN TO WORK:  ______________________________________________________________________________________ °9. You should see your doctor in the office for a follow-up appointment approximately two weeks after your surgery.  Your doctor’s nurse will typically make your follow-up appointment when she calls you with your pathology report.  Expect your pathology report 3-4 business days after your surgery.  You may call to check if you do not hear from us after three days. °10. OTHER INSTRUCTIONS: _______________________________________________________________________________________________ _____________________________________________________________________________________________________________________________________ °_____________________________________________________________________________________________________________________________________ °_____________________________________________________________________________________________________________________________________ ° °WHEN TO CALL DR WAKEFIELD: °1. Fever over 101.0 °2. Nausea and/or vomiting. °3. Extreme swelling or bruising. °4. Continued bleeding from incision. °5. Increased pain, redness, or drainage from the incision. ° °The clinic staff is available to answer your questions during regular  business hours.  Please don’t hesitate to call and ask to speak to one of the nurses for clinical concerns.  If   you have a medical emergency, go to the nearest emergency room or call 911.  A surgeon from [facility] is always on call at the hospital. ° °For further questions, please visit [email or web address] ° ° ° °Post Anesthesia Home Care Instructions ° °Activity: °Get plenty of rest for the remainder of the day. A responsible adult should stay with you for 24 hours following the procedure.  °For the next 24 hours, DO NOT: °-Drive a car °-Operate machinery °-Drink alcoholic beverages °-Take any medication unless instructed by your physician °-Make any legal decisions or sign important papers. ° °Meals: °Start with liquid foods such as gelatin or soup. Progress to regular foods as tolerated. Avoid greasy, spicy, heavy foods. If nausea and/or vomiting occur, drink only clear liquids until the nausea and/or vomiting subsides. Call your physician if vomiting continues. ° °Special Instructions/Symptoms: °Your throat may feel dry or sore from the anesthesia or the breathing tube placed in your throat during surgery. If this causes discomfort, gargle with warm salt water. The discomfort should disappear within 24 hours. ° °If you had a scopolamine patch placed behind your ear for the management of post- operative nausea and/or vomiting: ° °1. The medication in the patch is effective for 72 hours, after which it should be removed.  Wrap patch in a tissue and discard in the trash. Wash hands thoroughly with soap and water. °2. You may remove the patch earlier than 72 hours if you experience unpleasant side effects which may include dry mouth, dizziness or visual disturbances. °3. Avoid touching the patch. Wash your hands with soap and water after contact with the patch. °  ° °

## 2015-11-14 NOTE — Interval H&P Note (Signed)
History and Physical Interval Note:  11/14/2015 8:55 AM  Julie [name]  has presented today for surgery, with the diagnosis of LEFT BREAST CANCER  The various methods of treatment have been discussed with the patient and family. After consideration of risks, benefits and other options for treatment, the patient has consented to  Procedure(s): RADIOACTIVE SEED GUIDED LEFT BREAST LUMPECTOMY WITH AXILLARY SENTINEL LYMPH NODE BIOPSY (Left) as a surgical intervention .  The patient's history has been reviewed, patient examined, no change in status, stable for surgery.  I have reviewed the patient's chart and labs.  Questions were answered to the patient's satisfaction.     [name]

## 2015-11-14 NOTE — Progress Notes (Signed)
Assisted Dr. Marcie Bal with left, ultrasound guided, pectoralis block. Side rails up, monitors on throughout procedure. See vital signs in flow sheet. Tolerated Procedure well.

## 2015-11-14 NOTE — Progress Notes (Signed)
Emotional support during breast injections °

## 2015-11-14 NOTE — H&P (Signed)
82 yof who lives in [place] with her daughter, she is still active and driving. she underwent screening mm that shows density b. she has no mass or discharge. no she has no real personal history. she has family history. her mammogram showed a left breast mass. the right side was normal. there is a 7 mm spiculated mass in the left breast on mm as well as ultrasound. there is no axillary adenopathy. this underwent core biopsy that shows a grade I IDC that is triple negative, Ki is 80%.   Other Problems Elbert Ewings, CMA; 10/30/2015 10:42 AM) Diabetes Mellitus  Past Surgical History Elbert Ewings, CMA; 10/30/2015 10:42 AM) Breast Biopsy Left. Gallbladder Surgery - Open Hysterectomy (not due to cancer) - Partial Knee Surgery Left. Shoulder Surgery Left.  Diagnostic Studies History Elbert Ewings, CMA; 10/30/2015 10:42 AM) Mammogram within last year  Allergies Elbert Ewings, CMA; 10/30/2015 10:43 AM) Aspirin EC *ANALGESICS - NonNarcotic* Macrobid *URINARY ANTI-INFECTIVES* Macrodantin *Urinary Anti-infectives** Nitrofurantoin Monohyd Macro *URINARY ANTI-INFECTIVES* Oxybutinin Chloride *URINARY ANTISPASMODICS* Trospium Chloride *URINARY ANTISPASMODICS*  Medication History Elbert Ewings, CMA; 10/30/2015 10:47 AM) Lyrica (75MG  Capsule, Oral) Active. AmLODIPine Besylate (10MG  Tablet, Oral) Active. TraMADol HCl (50MG  Tablet, Oral as needed) Active. ClonazePAM (0.5MG  Tablet, Oral) Active. Fish Oil Maximum Strength (1200MG  Capsule, Oral) Active. Folic Acid (1MG  Tablet, Oral) Active. Meloxicam (15MG  Tablet, Oral) Active. Methotrexate Sodium (PF) (50MG /2ML Solution, Injection weekly) Active. NexIUM (40MG  Capsule DR, Oral) Active. Tylenol (325MG  Tablet, Oral) Active. Zetia (10MG  Tablet, Oral) Active. Valsartan-Hydrochlorothiazide (320-12.5MG  Tablet, Oral) Active. PredniSONE (5MG  Tablet, Oral) Active. MetFORMIN HCl (500MG  Tablet, Oral) Active. BD TB Syringe (27G X  1/2"1 ML Misc,) Active. FreeStyle Lancets Active. Metoprolol Succinate ER (50MG  Tablet ER 24HR, Oral) Active. Medications Reconciled  Social History Elbert Ewings, [place]; 10/30/2015 10:42 AM) Caffeine use Coffee.  Family History Elbert Ewings, [place]; 10/30/2015 10:42 AM) Arthritis Mother, Sister. Breast Cancer Mother. Cancer Family Members In General. Cerebrovascular Accident Father. Colon Cancer Sister. Depression Family Members In General. Prostate Cancer Family Members In General. Seizure disorder Family Members In General.  Pregnancy / Birth History Elbert Ewings, [place]; 10/30/2015 10:42 AM) Durenda Age 5 Maternal age 24-25 Para 5  Review of Systems Elbert Ewings CMA; 10/30/2015 10:42 AM) Eldridge Dace Not Present- Earache, Hearing Loss, Hoarseness, Nose Bleed, Oral Ulcers, Ringing in the Ears, Seasonal Allergies, Sinus Pain, Sore Throat, Visual Disturbances, Wears glasses/contact lenses and Yellow Eyes. Gastrointestinal Present- Change in Bowel Habits and Excessive gas. Not Present- Abdominal Pain, Bloating, Bloody Stool, Chronic diarrhea, Constipation, Difficulty Swallowing, Gets full quickly at meals, Hemorrhoids, Indigestion, Nausea, Rectal Pain and Vomiting. Female Genitourinary Not Present- Frequency, Nocturia, Painful Urination, Pelvic Pain and Urgency. Psychiatric Present- Anxiety. Not Present- Bipolar, Change in Sleep Pattern, Depression, Fearful and Frequent crying. Hematology Not Present- Easy Bruising, Excessive bleeding, Gland problems, HIV and Persistent Infections.  Vitals Elbert Ewings CMA; 10/30/2015 10:47 AM) 10/30/2015 10:47 AM Weight: 213 lb Height: 65in Body Surface Area: 2.03 m Body Mass Index: 35.44 kg/m  Temp.: 97.36F(Temporal)  Pulse: 70 (Regular)  BP: 132/78 (Sitting, Left Arm, Standard) Physical Exam Rolm Bookbinder MD; 10/30/2015 11:36 AM) General Mental Status-Alert. Orientation-Oriented X3. Chest and Lung Exam Chest and lung exam reveals  -on auscultation, normal breath sounds, no adventitious sounds and normal vocal resonance. Breast Nipples-No Discharge. Breast Lump-No Palpable Breast Mass. Cardiovascular Cardiovascular examination reveals -normal heart sounds, regular rate and rhythm with no murmurs. Lymphatic Head & Neck General Head & Neck Lymphatics: Bilateral - Description - Normal. Axillary General Axillary Region: Bilateral - Description -  Normal. Note: no [place] adenopathy   Assessment & Plan Rolm Bookbinder MD; 10/31/2015 8:50 AM) BREAST CANCER OF UPPER-OUTER QUADRANT OF LEFT FEMALE BREAST (C50.412) Story: Left breast seed guided lumpectomy, left axillary sentinel node biopsy We discussed the staging and pathophysiology of breast cancer. We discussed all of the different options for treatment for breast cancer including surgery, chemotherapy, radiation therapy, Herceptin, and antiestrogen therapy. We discussed a sentinel lymph node biopsy as she does not appear to having lymph node involvement right now. We discussed the performance of that with injection of radioactive tracer. We discussed that she would have an incision underneath her axillary hairline. We discussed that there is a chance of having a positive node with a sentinel lymph node biopsy and we will await the permanent pathology to make any other first further decisions in terms of her treatment. One of these options might be to return to the operating room to perform an axillary lymph node dissection. We discussed up to a 5% risk lifetime of chronic shoulder pain as well as lymphedema associated with a sentinel lymph node biopsy.We will proceed with lumpectomy as well. We discussed lumpectomy, mastectomy and the equivalency of these treatments I think she can continue medications at this point. will discuss with Dr Julien Nordmann.

## 2015-11-14 NOTE — Transfer of Care (Signed)
Immediate Anesthesia Transfer of Care Note  Patient: Julie [name]  Procedure(s) Performed: Procedure(s) with comments: RADIOACTIVE SEED GUIDED LEFT BREAST LUMPECTOMY WITH AXILLARY SENTINEL LYMPH NODE BIOPSY (Left) - RADIOACTIVE SEED GUIDED LEFT BREAST LUMPECTOMY WITH AXILLARY SENTINEL LYMPH NODE BIOPSY  Patient Location: PACU  Anesthesia Type:GA combined with regional for post-op pain  Level of Consciousness: awake, alert  and oriented  Airway & Oxygen Therapy: Patient Spontanous Breathing and Patient connected to face mask oxygen  Post-op Assessment: Report given to RN, Post -op Vital signs reviewed and stable and Patient moving all extremities  Post vital signs: Reviewed and stable  Last Vitals:  Filed Vitals:   11/14/15 0934 11/14/15 0935  BP:    Pulse: 59 64  Temp:    Resp: 15 20    Last Pain: There were no vitals filed for this visit.       Complications: No apparent anesthesia complications

## 2015-11-15 ENCOUNTER — Encounter (HOSPITAL_BASED_OUTPATIENT_CLINIC_OR_DEPARTMENT_OTHER): Payer: Self-pay | Admitting: General Surgery

## 2015-11-15 NOTE — Addendum Note (Signed)
Addendum  created 11/15/15 [id number] by Tawni Millers, CRNA   Modules edited: Charges VN

## 2015-11-19 DIAGNOSIS — I10 Essential (primary) hypertension: Secondary | ICD-10-CM | POA: Diagnosis not present

## 2015-11-19 DIAGNOSIS — E119 Type 2 diabetes mellitus without complications: Secondary | ICD-10-CM | POA: Diagnosis not present

## 2015-11-20 ENCOUNTER — Telehealth: Payer: Self-pay | Admitting: Internal Medicine

## 2015-11-20 ENCOUNTER — Encounter: Payer: Self-pay | Admitting: Internal Medicine

## 2015-11-20 ENCOUNTER — Ambulatory Visit (HOSPITAL_BASED_OUTPATIENT_CLINIC_OR_DEPARTMENT_OTHER): Payer: Medicare Other | Admitting: Internal Medicine

## 2015-11-20 VITALS — BP 160/76 | HR 63 | Temp 98.3°F | Resp 18 | Ht 63.0 in | Wt 214.8 lb

## 2015-11-20 DIAGNOSIS — C50912 Malignant neoplasm of unspecified site of left female breast: Secondary | ICD-10-CM | POA: Diagnosis not present

## 2015-11-20 DIAGNOSIS — Z171 Estrogen receptor negative status [ER-]: Secondary | ICD-10-CM | POA: Diagnosis not present

## 2015-11-20 DIAGNOSIS — C50412 Malignant neoplasm of upper-outer quadrant of left female breast: Secondary | ICD-10-CM

## 2015-11-20 DIAGNOSIS — D472 Monoclonal gammopathy: Secondary | ICD-10-CM | POA: Diagnosis not present

## 2015-11-20 NOTE — Progress Notes (Signed)
Julie [name] [phone]   Fax:([phone]) [phone]  OFFICE PROGRESS NOTE  [name], MD [address] [name] [address]  PRINCIPAL DIAGNOSIS:  1) stage IA (T1c, N0, M0) left breast invasive ductal carcinoma, negative ER, negative PR and negative HER-2 diagnosed in May 2017. 2) Monoclonal gammopathy of undetermined significance (MGUS).    PRIOR THERAPY: Left breast seed guided lumpectomy and left axillary sentinel lymph node biopsy under the care of Dr. [name] on 11/14/2015.   CURRENT THERAPY: Observation.   INTERVAL HISTORY:  Julie [name] 82 y.o. female returns to the clinic today for routine six-month followup visit accompanied by her daughter. The patient is feeling fine today with no specific complaints except for arthralgia. She was recently diagnosed with left breast invasive ductal carcinoma status post lumpectomy with sentinel lymph node biopsy under the care of Dr. [name]. The final pathology was consistent with invasive ductal carcinoma measuring 1.1 cm. The sentinel lymph nodes were negative for malignancy. The prognostic marker showed negative ER, PR and HER-2.  She denied having any significant chest pain, shortness of breath, cough or hemoptysis. She denied having any significant weight loss or night sweats. No bleeding issues. The patient has repeat CBC, comprehensive metabolic panel, LDH and myeloma panel performed recently and she is here for evaluation and discussion of her lab results.   MEDICAL HISTORY: Past Medical History  Diagnosis Date  . Blood transfusion     no abnormal reaction noted  . Heart murmur   . Hyperlipidemia   . Interstitial cystitis   . Ulcer 1970    duodenal  . Anxiety     takes Klonopin at bedtime  . GERD (gastroesophageal reflux disease)     takes Nexium daily  . Hypertension     takes Amlodipine,Metoprolol,and Losartan daily  . Complication of anesthesia     wakes up during surgery  .  History of hiatal hernia   . Cataracts, bilateral     immature  . Glaucoma   . Histoplasmosis   . Weakness     numbness in both hands and feet  . Arthritis     takes Methotrexate weekly and Prednisone  . Fibromyalgia     takes Lyrica daily  . Peripheral neuropathy ([place])   . Joint pain   . History of colon polyps   . Urinary frequency   . Urinary urgency   . Hard of hearing   . Nocturia   . Anemia   . Depression   . History of shingles   . PONV (postoperative nausea and vomiting)   . History of gout   . Incontinence of bowel   . Diabetes mellitus without complication ([place])     was on Metformin but has been off x 6 yrs    ALLERGIES:  is allergic to aspirin and other.  MEDICATIONS:  Current Outpatient Prescriptions  Medication Sig Dispense Refill  . acetaminophen (TYLENOL) 500 MG tablet Take 500 mg by mouth every 6 (six) hours as needed.    [name] amLODipine (NORVASC) 10 MG tablet Take 10 mg by mouth every morning.     . B-D TB SYRINGE 1CC/25GX5/8" 25G X 5/8" 1 ML MISC USE WITH METHOTREXATE  0  . calcium-vitamin D (OSCAL WITH D) 500-200 MG-UNIT per tablet Take 1 tablet by mouth every morning.    . clonazePAM (KLONOPIN) 0.5 MG tablet Take 0.5 mg by mouth at bedtime.    [name] esomeprazole (NEXIUM) 40 MG capsule  Take 40 mg by mouth daily before breakfast.      . ezetimibe (ZETIA) 10 MG tablet Take 10 mg by mouth every other day. Trial, taking every other day    . fish oil-omega-3 fatty acids 1000 MG capsule Take 1 g by mouth every morning.     . folic acid (FOLVITE) 1 MG tablet Take 2 mg by mouth every morning.     [name] HYDROcodone-acetaminophen (NORCO) 10-325 MG tablet Take 1 tablet by mouth every 6 (six) hours as needed. 20 tablet 0  . meloxicam (MOBIC) 15 MG tablet Take 15 mg by mouth daily.  11  . meloxicam (MOBIC) 7.5 MG tablet Take 7.5 mg by mouth daily.    . metFORMIN (GLUCOPHAGE) 500 MG tablet Take by mouth 2 (two) times daily with a meal.    . methotrexate (50 MG/ML) 1 g  injection Inject into the skin once a week.    . metoprolol (LOPRESSOR) 50 MG tablet Take 50 mg by mouth 2 (two) times daily.    . metoprolol succinate (TOPROL-XL) 50 MG 24 hr tablet TAKE 1 TABLET AT BEDTIME ONCE A DAY ORALLY 90 DAYS  2  . predniSONE (DELTASONE) 5 MG tablet Take 5 mg by mouth daily with breakfast.    . pregabalin (LYRICA) 75 MG capsule Take 75 mg by mouth 2 (two) times daily.    [name] senna (SENOKOT) 8.6 MG TABS tablet Take 2 tablets (17.2 mg total) by mouth at bedtime. 60 each 3  . traMADol (ULTRAM) 50 MG tablet Take by mouth every 6 (six) hours as needed.    . valsartan-hydrochlorothiazide (DIOVAN-HCT) 320-12.5 MG per tablet Take 1 tablet by mouth daily.    . vitamin E 400 UNIT capsule Take 400 Units by mouth daily.     No current facility-administered medications for this visit.    SURGICAL HISTORY:  Past Surgical History  Procedure Laterality Date  . Upper gastrointestinal endoscopy    . Colonoscopy    . Vaginal hysterectomy    . Salpingoophorectomy Bilateral   . Cholecystectomy    . Cystocele repair    . Ankle surgery Left   . Arm surgery Left     radius  . Cervical fusion    . Dilation and curettage of uterus    . Bladder tacked     . Accupuncture  3 yrs ago  . Esophagogastroduodenoscopy    . Ankle fusion Left 05/10/2014    Procedure: LEFT ANKLE ARTHRODESIS ;  Surgeon: Wylene Simmer, MD;  Location: [facility];  Service: Orthopedics;  Laterality: Left;  . Total knee arthroplasty Right 02/28/2015    Procedure: RIGHT TOTAL KNEE ARTHROPLASTY;  Surgeon: Rod Can, MD;  Location: [facility];  Service: Orthopedics;  Laterality: Right;  . Radioactive seed guided mastectomy with axillary sentinel lymph node biopsy Left 11/14/2015    Procedure: RADIOACTIVE SEED GUIDED LEFT BREAST LUMPECTOMY WITH AXILLARY SENTINEL LYMPH NODE BIOPSY;  Surgeon: Rolm Bookbinder, MD;  Location: [facility];  Service: General;  Laterality: Left;  RADIOACTIVE SEED GUIDED LEFT BREAST  LUMPECTOMY WITH AXILLARY SENTINEL LYMPH NODE BIOPSY    REVIEW OF SYSTEMS:  Constitutional: positive for fatigue Eyes: negative Ears, nose, mouth, throat, and face: negative Respiratory: negative Cardiovascular: negative Gastrointestinal: negative Genitourinary:negative Integument/breast: negative Hematologic/lymphatic: negative Musculoskeletal:positive for arthralgias Neurological: negative Behavioral/Psych: negative Endocrine: negative Allergic/Immunologic: negative   PHYSICAL EXAMINATION: General appearance: alert, cooperative, fatigued and no distress Head: Normocephalic, without obvious abnormality, atraumatic Neck: no adenopathy, no JVD, supple, symmetrical, trachea midline  and thyroid not enlarged, symmetric, no tenderness/mass/nodules Lymph nodes: Cervical, supraclavicular, and axillary nodes normal. Resp: clear to auscultation bilaterally Back: symmetric, no curvature. ROM normal. No CVA tenderness. Cardio: regular rate and rhythm, S1, S2 normal, no murmur, click, rub or gallop GI: soft, non-tender; bowel sounds normal; no masses,  no organomegaly Extremities: extremities normal, atraumatic, no cyanosis or edema  ECOG PERFORMANCE STATUS: 1 - Symptomatic but completely ambulatory  Blood pressure 160/76, pulse 63, temperature 98.3 F (36.8 C), temperature source Oral, resp. rate 18, height 5' 3" (1.6 m), weight 214 lb 12.8 oz (97.433 kg), SpO2 99 %.  LABORATORY DATA: Lab Results  Component Value Date   WBC 5.0 11/11/2015   HGB 10.8* 11/11/2015   HCT 33.6* 11/11/2015   MCV 84.9 11/11/2015   PLT 213 11/11/2015      Chemistry      Component Value Date/Time   NA 145 11/11/2015 0937   NA 140 03/01/2015 0410   K 4.3 11/11/2015 0937   K 4.3 03/01/2015 0410   CL 108 03/01/2015 0410   CL 108* 02/10/2012 0902   CO2 30* 11/11/2015 0937   CO2 26 03/01/2015 0410   BUN 33.2* 11/11/2015 0937   BUN 24* 03/01/2015 0410   CREATININE 1.1 11/11/2015 0937   CREATININE 1.10*  03/01/2015 0410      Component Value Date/Time   CALCIUM 9.7 11/11/2015 0937   CALCIUM 8.4* 03/01/2015 0410   ALKPHOS 61 11/11/2015 0937   ALKPHOS 62 02/21/2015 1035   AST 20 11/11/2015 0937   AST 23 02/21/2015 1035   ALT 15 11/11/2015 0937   ALT 13* 02/21/2015 1035   BILITOT 0.59 11/11/2015 0937   BILITOT 0.4 02/21/2015 1035     Other lab results: Beta-2 microglobulin 2.40, IgG 659, IgA 713 and IgM 41. Free kappa light chain 46.8, free lambda light chain 12.7 with a kappa/lambda ratio 3.69  RADIOGRAPHIC STUDIES: No results found.  ASSESSMENT AND PLAN: This is a very pleasant 82 years old [place] American female with  1) history of monoclonal gammopathy of unknown significance currently on observation. The patient continues to do well and there is no evidence for disease progression on the recent blood work.  I discussed the lab result with the patient. I recommended for her to continue observation for now with repeat CBC, comprehensive metabolic panel, LDH and myeloma panel in 6 months.   2) newly diagnosed stage IA ((T1c, N0, Mx) triple negative left breast invasive ductal carcinoma status post left lumpectomy. I had a lengthy discussion with the patient and her daughter today about her condition and further treatment options. I explained to the patient that she will need adjuvant radiotherapy and she is scheduled to see Dr. Sondra Come on 11/02/2015. I will also refer the patient to the multidisciplinary breast clinic for evaluation by one of the breast specialist and discussion of adjuvant chemotherapy if needed.  She was advised to call me immediately if she has any concerning symptoms in the interval.  All questions were answered. The patient knows to call the clinic with any problems, questions or concerns. We can certainly see the patient much sooner if necessary. The patient voices understanding of current disease status and treatment options and is in agreement with the current  care plan.  All questions were answered. The patient knows to call the clinic with any problems, questions or concerns. We can certainly see the patient much sooner if necessary.  Disclaimer: This note was dictated with voice recognition software.  Similar sounding words can inadvertently be transcribed and may not be corrected upon review.

## 2015-11-20 NOTE — Telephone Encounter (Signed)
Gave pt cal & avs °

## 2015-11-21 ENCOUNTER — Encounter: Payer: Self-pay | Admitting: Hematology and Oncology

## 2015-11-21 ENCOUNTER — Telehealth: Payer: Self-pay | Admitting: Hematology and Oncology

## 2015-11-21 NOTE — Telephone Encounter (Signed)
Appointment with [facility] on 7/11 at 1:00pm. Patient agreed. Letter to referring.

## 2015-11-22 ENCOUNTER — Encounter: Payer: Self-pay | Admitting: Hematology

## 2015-11-22 ENCOUNTER — Ambulatory Visit: Payer: Medicare Other | Admitting: Hematology

## 2015-11-22 DIAGNOSIS — E119 Type 2 diabetes mellitus without complications: Secondary | ICD-10-CM | POA: Diagnosis not present

## 2015-11-22 DIAGNOSIS — I10 Essential (primary) hypertension: Secondary | ICD-10-CM | POA: Diagnosis not present

## 2015-11-25 ENCOUNTER — Ambulatory Visit (HOSPITAL_BASED_OUTPATIENT_CLINIC_OR_DEPARTMENT_OTHER): Payer: Medicare Other | Admitting: Hematology

## 2015-11-25 ENCOUNTER — Encounter: Payer: Self-pay | Admitting: Hematology

## 2015-11-25 ENCOUNTER — Telehealth: Payer: Self-pay | Admitting: Hematology

## 2015-11-25 VITALS — BP 154/74 | HR 64 | Temp 98.5°F | Resp 19 | Ht 63.0 in | Wt 215.9 lb

## 2015-11-25 DIAGNOSIS — Z171 Estrogen receptor negative status [ER-]: Secondary | ICD-10-CM | POA: Diagnosis not present

## 2015-11-25 DIAGNOSIS — D472 Monoclonal gammopathy: Secondary | ICD-10-CM | POA: Diagnosis not present

## 2015-11-25 DIAGNOSIS — D649 Anemia, unspecified: Secondary | ICD-10-CM

## 2015-11-25 DIAGNOSIS — C50412 Malignant neoplasm of upper-outer quadrant of left female breast: Secondary | ICD-10-CM

## 2015-11-25 NOTE — Progress Notes (Signed)
[facility]  [phone] [phone] [place] Note   Patient Care Team: [name], MD as PCP - General (Internal Medicine) Valinda Party, MD as Referring Physician (Rheumatology) 11/25/2015  CHIEF COMPLAINTS/PURPOSE OF CONSULTATION:  Newly diagnosed left breast cancer   Oncology History   Malignant neoplasm of upper outer quadrant of female breast [facility])   Staging form: Breast, AJCC 7th Edition     Pathologic stage from 11/14/2015: Stage IA (T1c, N0, cM0) - Signed by Truitt Merle, MD on 11/25/2015     Clinical stage from 11/20/2015: Stage IA (T1c, N0, M0) - Signed by Curt Bears, MD on 11/20/2015       Malignant neoplasm of upper outer quadrant of female breast ([place])   10/09/2015 Mammogram Screening bilateral mammograms showed a possible left breast mass. Diagnostic mammogram and ultrasound showed a 7 x 7 x 7 mm mass in the left breast at 11:00 position.   10/22/2015 Receptors her2 ER negative, PR negative, HER-2 not amplified   10/22/2015 Initial Biopsy Left breast needle core biopsy at the 1:00 position showed invasive ductal carcinoma.   10/22/2015 Initial Diagnosis Malignant neoplasm of upper outer quadrant of female breast ([place])   11/14/2015 Surgery Left breast lumpectomy and sentinel lymph node biopsy   11/14/2015 Pathology Results Left breast lumpectomy showed invasive ductal carcinoma, grade 3, 1.1 cm, resection margins were negative, sentinel lymph node biopsy showed benign breast parenchyma, lymph node tissue not identified. Repeated ER PR and HER-2 were all negative.    HISTORY OF PRESENTING ILLNESS:  Julie [name] 82 y.o. female is here because of her recently diagnosed left breast cancer. She has been seen my partner Dr. Julien Nordmann for MGUS, and was referred to me to discuss her adjuvant therapy for breast cancer. She is accompanied by her daughter to the clinic today.  Her breast cancer was discovered by screening mammogram. She has no  palpable breast mass, skin change or nipple discharge. She denies any new constitutional symptoms. The diagnostic mammogram and ultrasound showed a 7 mm mass in the left breast, biopsy showed invasive ductal carcinoma, triple negative. She was seen by breast surgeon Dr. [name], and underwent left rest lumpectomy and sentinel lymph node biopsy on 11/14/2015.   She had a multiple orthopedic surgeries in the past, takes tramadol as needed. She lives with her daughter, pretty independent, does not exercise regularly, but it goes out for shopping, playing cards, etc.  GYN HISTORY  Menarchal: 16 LMP: 43 (hysrectomy)  Contraceptive: 2 HRT: 1 yr  G5P5: she did breast feeding to 2 children     MEDICAL HISTORY:  Past Medical History  Diagnosis Date  . Blood transfusion     no abnormal reaction noted  . Heart murmur   . Hyperlipidemia   . Interstitial cystitis   . Ulcer 1970    duodenal  . Anxiety     takes Klonopin at bedtime  . GERD (gastroesophageal reflux disease)     takes Nexium daily  . Hypertension     takes Amlodipine,Metoprolol,and Losartan daily  . Complication of anesthesia     wakes up during surgery  . History of hiatal hernia   . Cataracts, bilateral     immature  . Glaucoma   . Histoplasmosis   . Weakness     numbness in both hands and feet  . Arthritis     takes Methotrexate weekly and Prednisone  . Fibromyalgia     takes Lyrica daily  .  Peripheral neuropathy ([place])   . Joint pain   . History of colon polyps   . Urinary frequency   . Urinary urgency   . Hard of hearing   . Nocturia   . Anemia   . Depression   . History of shingles   . PONV (postoperative nausea and vomiting)   . History of gout   . Incontinence of bowel   . Diabetes mellitus without complication ([place])     was on Metformin but has been off x 6 yrs    SURGICAL HISTORY: Past Surgical History  Procedure Laterality Date  . Upper gastrointestinal endoscopy    . Colonoscopy    .  Vaginal hysterectomy    . Salpingoophorectomy Bilateral   . Cholecystectomy    . Cystocele repair    . Ankle surgery Left   . Arm surgery Left     radius  . Cervical fusion    . Dilation and curettage of uterus    . Bladder tacked     . Accupuncture  3 yrs ago  . Esophagogastroduodenoscopy    . Ankle fusion Left 05/10/2014    Procedure: LEFT ANKLE ARTHRODESIS ;  Surgeon: Wylene Simmer, MD;  Location: [place];  Service: Orthopedics;  Laterality: Left;  . Total knee arthroplasty Right 02/28/2015    Procedure: RIGHT TOTAL KNEE ARTHROPLASTY;  Surgeon: Rod Can, MD;  Location: [facility];  Service: Orthopedics;  Laterality: Right;  . Radioactive seed guided mastectomy with axillary sentinel lymph node biopsy Left 11/14/2015    Procedure: RADIOACTIVE SEED GUIDED LEFT BREAST LUMPECTOMY WITH AXILLARY SENTINEL LYMPH NODE BIOPSY;  Surgeon: Rolm Bookbinder, MD;  Location: [facility];  Service: General;  Laterality: Left;  RADIOACTIVE SEED GUIDED LEFT BREAST LUMPECTOMY WITH AXILLARY SENTINEL LYMPH NODE BIOPSY    SOCIAL HISTORY: Social History   Social History  . Marital Status: Divorced    Spouse Name: N/A  . Number of Children: N/A  . Years of Education: N/A   Occupational History  . Not on file.   Social History Main Topics  . Smoking status: Never Smoker   . Smokeless tobacco: Never Used  . Alcohol Use: No     Comment: IN [place] ONLY  . Drug Use: No  . Sexual Activity: Not on file   Other Topics Concern  . Not on file   Social History Narrative    FAMILY HISTORY: Family History  Problem Relation Age of Onset  . Colon cancer Sister   . Esophageal cancer Brother   . Breast cancer Mother     ALLERGIES:  is allergic to aspirin and other.  MEDICATIONS:  Current Outpatient Prescriptions  Medication Sig Dispense Refill  . acetaminophen (TYLENOL) 500 MG tablet Take 500 mg by mouth every 6 (six) hours as needed.    [name] amLODipine (NORVASC) 10 MG tablet Take 10 mg  by mouth every morning.     . B-D TB SYRINGE 1CC/25GX5/8" 25G X 5/8" 1 ML MISC USE WITH METHOTREXATE  0  . calcium-vitamin D (OSCAL WITH D) 500-200 MG-UNIT per tablet Take 1 tablet by mouth every morning.    . clonazePAM (KLONOPIN) 0.5 MG tablet Take 0.5 mg by mouth at bedtime.    [name] esomeprazole (NEXIUM) 40 MG capsule Take 40 mg by mouth daily before breakfast.      . ezetimibe (ZETIA) 10 MG tablet Take 10 mg by mouth every other day. Trial, taking every other day    . fish oil-omega-3 fatty acids  1000 MG capsule Take 1 g by mouth every morning.     . folic acid (FOLVITE) 1 MG tablet Take 2 mg by mouth every morning.     [name] HYDROcodone-acetaminophen (NORCO) 10-325 MG tablet Take 1 tablet by mouth every 6 (six) hours as needed. 20 tablet 0  . meloxicam (MOBIC) 15 MG tablet Take 15 mg by mouth daily.  11  . meloxicam (MOBIC) 7.5 MG tablet Take 7.5 mg by mouth daily.    . metFORMIN (GLUCOPHAGE) 500 MG tablet Take by mouth 2 (two) times daily with a meal.    . methotrexate (50 MG/ML) 1 g injection Inject into the skin once a week.    . metoprolol (LOPRESSOR) 50 MG tablet Take 50 mg by mouth 2 (two) times daily.    . metoprolol succinate (TOPROL-XL) 50 MG 24 hr tablet TAKE 1 TABLET AT BEDTIME ONCE A DAY ORALLY 90 DAYS  2  . predniSONE (DELTASONE) 5 MG tablet Take 5 mg by mouth daily with breakfast.    . pregabalin (LYRICA) 75 MG capsule Take 75 mg by mouth 2 (two) times daily.    [name] senna (SENOKOT) 8.6 MG TABS tablet Take 2 tablets (17.2 mg total) by mouth at bedtime. 60 each 3  . traMADol (ULTRAM) 50 MG tablet Take by mouth every 6 (six) hours as needed.    . valsartan-hydrochlorothiazide (DIOVAN-HCT) 320-12.5 MG per tablet Take 1 tablet by mouth daily.    . vitamin E 400 UNIT capsule Take 400 Units by mouth daily.     No current facility-administered medications for this visit.    REVIEW OF SYSTEMS:   Constitutional: Denies fevers, chills or abnormal night sweats Eyes: Denies blurriness of  vision, double vision or watery eyes Ears, nose, mouth, throat, and face: Denies mucositis or sore throat Respiratory: Denies cough, dyspnea or wheezes Cardiovascular: Denies palpitation, chest discomfort or lower extremity swelling Gastrointestinal:  Denies nausea, heartburn or change in bowel habits Skin: Denies abnormal skin rashes Lymphatics: Denies new lymphadenopathy or easy bruising Neurological:Denies numbness, tingling or new weaknesses Behavioral/Psych: Mood is stable, no new changes  All other systems were reviewed with the patient and are negative.  PHYSICAL EXAMINATION: ECOG PERFORMANCE STATUS: 1 - Symptomatic but completely ambulatory  Filed Vitals:   11/25/15 1151  BP: 154/74  Pulse: 64  Temp: 98.5 F (36.9 C)  Resp: 19   Filed Weights   11/25/15 1151  Weight: 215 lb 14.4 oz (97.932 kg)    GENERAL:alert, no distress and comfortable SKIN: skin color, texture, turgor are normal, no rashes or significant lesions EYES: normal, conjunctiva are pink and non-injected, sclera clear OROPHARYNX:no exudate, no erythema and lips, buccal mucosa, and tongue normal  NECK: supple, thyroid normal size, non-tender, without nodularity LYMPH:  no palpable lymphadenopathy in the cervical, axillary or inguinal LUNGS: clear to auscultation and percussion with normal breathing effort HEART: regular rate & rhythm and no murmurs and no lower extremity edema ABDOMEN:abdomen soft, non-tender and normal bowel sounds Musculoskeletal:no cyanosis of digits and no clubbing  PSYCH: alert & oriented x 3 with fluent speech NEURO: no focal motor/sensory deficits Breasts: Breast inspection showed them to be symmetrical with no nipple discharge. Surgical incisions in the left breast and axilla are healing well. No skin erythema or discharge. Palpation of the breasts and axilla revealed no obvious mass that I could appreciate.   LABORATORY DATA:  I have reviewed the data as listed CBC Latest Ref  Rng 11/11/2015 03/02/2015 03/01/2015  WBC 3.9 -  10.3 10e3/uL 5.0 8.5 10.6(H)  Hemoglobin 11.6 - 15.9 g/dL 10.8(L) 7.7(L) 9.1(L)  Hematocrit 34.8 - 46.6 % 33.6(L) 24.3(L) 28.2(L)  Platelets 145 - 400 10e3/uL 213 189 225   CMP Latest Ref Rng 11/11/2015 03/01/2015 02/21/2015  Glucose 70 - 140 mg/dl 84 132(H) 120(H)  BUN 7.0 - 26.0 mg/dL 33.2(H) 24(H) 21(H)  Creatinine 0.6 - 1.1 mg/dL 1.1 1.10(H) 0.83  Sodium 136 - 145 mEq/L 145 140 146(H)  Potassium 3.5 - 5.1 mEq/L 4.3 4.3 4.7  Chloride 101 - 111 mmol/L - 108 110  CO2 22 - 29 mEq/L 30(H) 26 30  Calcium 8.4 - 10.4 mg/dL 9.7 8.4(L) 9.8  Total Protein 6.4 - 8.3 g/dL 6.9 - 7.4  Total Bilirubin 0.20 - 1.20 mg/dL 0.59 - 0.4  Alkaline Phos 40 - 150 U/L 61 - 62  AST 5 - 34 U/L 20 - 23  ALT 0 - 55 U/L 15 - 13(L)    PATHOLOGY REPORT  Diagnosis 11/14/2015 1. Breast, lumpectomy, Left - INVASIVE DUCTAL CARCINOMA, GRADE III/III, SPANNING 1.1 CM. - THE RESECTION MARGINS ARE NEGATIVE FOR CARCINOMA. - SEE ONCOLOGY TABLE BELOW. 2. Lymph node, sentinel, biopsy, Left Axillary - BENIGN BREAST PARENCHYMA. - LYMPH NODAL TISSUE IS NOT IDENTIFIED. - THERE IS NO EVIDENCE OF MALIGNANCY. Microscopic Comment 1. BREAST, INVASIVE TUMOR, WITH LYMPH NODES PRESENT Specimen, including laterality and lymph node sampling (sentinel, non-sentinel): Left breast Procedure: Seed localized lumpectomy Histologic type: Ductal Grade: III Tubule formation: 2 Nuclear pleomorphism: 3 Mitotic: 3 Tumor size (gross measurement): 1.1 cm Margins: Greater than 0.2 cm to all margins Lymphovascular invasion: Not identified Ductal carcinoma in situ: Not identified. Lobular neoplasia: Not identified. Tumor focality: Unifocal Treatment effect: N/A Extent of tumor: Confined to breast parenchyma Lymph nodes: None examined. Breast prognostic profile: Case [phone] Estrogen receptor: 0% Progesterone receptor: 0% Her 2 neu: No amplification was detected. Ki-67: 80% A breast  prognostic profile will be repeated on the case and the results reported separately. Non-neoplastic breast: No significant findings. TNM: pT1c, pNX (JBK:kh 11-15-15) 2 of 4 FINAL for Droll, Rachella L ([id number]) Enid Cutter MD Pathologist, Electronic  Results: IMMUNOHISTOCHEMICAL AND MORPHOMETRIC ANALYSIS PERFORMED MANUALLY Estrogen Receptor: 0%, NEGATIVE Progesterone Receptor: 0%, NEGATIVE  Results: HER2 - NEGATIVE RATIO OF HER2/CEP17 SIGNALS 1.32 AVERAGE HER2 COPY NUMBER PER CELL 3.10   RADIOGRAPHIC STUDIES: I have personally reviewed the radiological images as listed and agreed with the findings in the report. Nm Sentinel Node Inj-no Rpt (breast)  11/14/2015  CLINICAL DATA: left axillary sn biopsy Sulfur colloid was injected intradermally by the nuclear medicine technologist for breast cancer sentinel node localization.   Mm Breast Surgical Specimen  11/14/2015  CLINICAL DATA:  Specimen radiograph status post left breast lumpectomy. EXAM: SPECIMEN RADIOGRAPH OF THE LEFT BREAST COMPARISON:  Previous exam(s). FINDINGS: Status post excision of the left breast. The radioactive seed and biopsy marker clip are present and were marked for pathology. Two specimen radiographs were obtained in order to visualize the full length of the radioactive seed. Though the seed is present in both radiographs, the full length of the seed was not seen in either image. This was discussed with Dr. [name] at 10:50 a.m. on 11/14/2015. IMPRESSION: Specimen radiograph of the left breast. Electronically Signed   By: Ammie Ferrier M.D.   On: 11/14/2015 12:34   Mm Lt Radioactive Seed Loc Mammo Guide  11/13/2015  CLINICAL DATA:  82 year old female for seed localization of biopsy marker at site of biopsy proven invasive ductal carcinoma EXAM:  MAMMOGRAPHIC GUIDED RADIOACTIVE SEED LOCALIZATION OF THE LEFT BREAST COMPARISON:  Previous exam(s). FINDINGS: Patient presents for radioactive seed localization prior to  left lumpectomy. I met with the patient and we discussed the procedure of seed localization including benefits and alternatives. We discussed the high likelihood of a successful procedure. We discussed the risks of the procedure including infection, bleeding, tissue injury and further surgery. We discussed the low dose of radioactivity involved in the procedure. Informed, written consent was given. The usual time-out protocol was performed immediately prior to the procedure. Using mammographic guidance, sterile technique, 1% lidocaine and an I-125 radioactive seed, the ribbon shaped biopsy marker within the upper, outer left breast was localized using a superior to inferior approach. The follow-up mammogram images confirm the seed in the expected location and were marked for Dr. [name]. Follow-up survey of the patient confirms presence of the radioactive seed. Order number of I-125 seed:  [id number]. Total activity:  0.263 mCi  Reference Date: November 01, 2015 The patient tolerated the procedure well and was released from the [facility]. She was given instructions regarding seed removal. IMPRESSION: Radioactive seed localization of the left breast. No apparent complications. Electronically Signed   By: Pamelia Hoit M.D.   On: 11/13/2015 14:44    ASSESSMENT & PLAN:  82 year old female, with past medical history of MGUS, hypertension, arthritis, with good performance status, was found to have stage I triple negative left breast cancer by screening mammogram.  1. Malignant or new present of upper outer quadrant of left female breast, invasive ductal carcinoma, grade 3, pT1cN0M0, stage IA --I discussed her breast mammogram, ultrasound, biopsy and final surgical path result in details -She had sentinel lymph node biopsy, however it showed normal breast parenchyma, no lymphoid tissue, her pathological node status is unknown, no ultrasound evidence of adenopathy. -We reviewed than aggressive nature of triple  negative breast cancer. Giving her stage I disease, I think she probably has 20-30% risk of distant recurrence in the next 5-10 years.  -Given her advanced age, she would not be a candidate for intensive adjuvant chemotherapy. However she does have good performance status and general health, I recommend her to consider single agent Taxol or Abraxane, weekly for a total of 12 weeks. It will be also reasonable to consider weekly carbo and Taxol for 12 weeks if she can tolerate. Written material about this to chemotherapy drugs were given to her for review.  -I encouraged her to participate chemotherapy class, obtain more information to help her to make the final decision. -She would like to discuss chemotherapy with her family members, and we'll make a final decision in the next week or 2. -She is scheduled to see radiation oncology, she will likely proceed adjuvant breast radiation -If she agrees with chemotherapy, I'll likely start her at the end of July -Finally we discussed [place] cancer surveillance, including annual screening mammogram, self exam, routine follow-up with lab and exam, for total 5 years.  2. MGUS -will continue follow up SPEP/IFE, and light chain levels.  3. Anemia  -she has chronic anemia, worse last year due to her surgery  -will check her iron study and folic acid, E26 level next time   Plan -she will think about chemotherapy, but dissipated chemotherapy class, the make a final decision. She will call me about her decision, and I'll schedule her future follow-up accordingly.  All questions were answered. The patient knows to call the clinic with any problems, questions or concerns. I spent 55 minutes  counseling the patient face to face. The total time spent in the appointment was 60 minutes and more than 50% was on counseling.     Truitt Merle, MD 11/25/2015

## 2015-11-25 NOTE — Telephone Encounter (Signed)
Gave pt avs °

## 2015-11-26 ENCOUNTER — Encounter: Payer: Self-pay | Admitting: Hematology

## 2015-11-28 ENCOUNTER — Telehealth: Payer: Self-pay | Admitting: *Deleted

## 2015-11-28 ENCOUNTER — Other Ambulatory Visit: Payer: Medicare Other

## 2015-11-28 NOTE — Telephone Encounter (Signed)
Pt called and left message re:  Pt had attended chemo education this am.  Pt has made decisions  about her plan of care.   Pt would like to talk to Dr. Burr Medico about what to do next.  Would appreciate a call back from md. Pt's    Phone    [phone].

## 2015-11-28 NOTE — Telephone Encounter (Signed)
I called her back, unfortunately her cell phone signal was poor and I could not have a complete conversation with her. I told her to call me back tomorrow. If she agrees with chemo, I will schedule her to start weekly taxol next week.   Julie [name]  11/28/2015

## 2015-12-02 ENCOUNTER — Ambulatory Visit: Payer: Medicare Other | Admitting: Radiation Oncology

## 2015-12-02 ENCOUNTER — Ambulatory Visit
Admission: RE | Admit: 2015-12-02 | Discharge: 2015-12-02 | Disposition: A | Discharge status: Home / Self Care | Payer: Medicare Other | Source: Ambulatory Visit | Attending: Radiation Oncology | Admitting: Radiation Oncology

## 2015-12-03 ENCOUNTER — Ambulatory Visit: Payer: Medicare Other | Admitting: Hematology and Oncology

## 2015-12-04 ENCOUNTER — Ambulatory Visit: Payer: Medicare Other

## 2015-12-04 ENCOUNTER — Ambulatory Visit: Payer: Medicare Other | Admitting: Radiation Oncology

## 2015-12-06 ENCOUNTER — Encounter: Payer: Self-pay | Admitting: *Deleted

## 2015-12-10 ENCOUNTER — Telehealth: Payer: Self-pay | Admitting: Hematology

## 2015-12-10 ENCOUNTER — Other Ambulatory Visit: Payer: Self-pay | Admitting: Hematology

## 2015-12-10 NOTE — Telephone Encounter (Signed)
Left message for patient re next appointment for 7/28 and mailed schedule. Per staff message reply from [facility] re 8/4 f/u - no need for f/u 8/4 just schedule patient for f/u 7/28 with chemo. Also per staff message reply from MM ok to cx January 2018 appointment with him - patient now seeing YF and to begin to tx.

## 2015-12-11 ENCOUNTER — Encounter: Payer: Self-pay | Admitting: Internal Medicine

## 2015-12-19 ENCOUNTER — Other Ambulatory Visit: Payer: Self-pay | Admitting: Hematology

## 2015-12-19 ENCOUNTER — Other Ambulatory Visit: Payer: Self-pay | Admitting: *Deleted

## 2015-12-20 ENCOUNTER — Ambulatory Visit (HOSPITAL_BASED_OUTPATIENT_CLINIC_OR_DEPARTMENT_OTHER): Payer: Medicare Other | Admitting: Hematology

## 2015-12-20 ENCOUNTER — Encounter: Payer: Self-pay | Admitting: *Deleted

## 2015-12-20 ENCOUNTER — Encounter: Payer: Self-pay | Admitting: Hematology

## 2015-12-20 ENCOUNTER — Other Ambulatory Visit (HOSPITAL_BASED_OUTPATIENT_CLINIC_OR_DEPARTMENT_OTHER): Payer: Medicare Other

## 2015-12-20 ENCOUNTER — Ambulatory Visit (HOSPITAL_BASED_OUTPATIENT_CLINIC_OR_DEPARTMENT_OTHER): Payer: Medicare Other

## 2015-12-20 VITALS — BP 167/76 | HR 57 | Temp 97.8°F | Resp 17 | Ht 63.0 in | Wt 217.5 lb

## 2015-12-20 VITALS — BP 146/79 | HR 57 | Temp 98.2°F | Resp 16

## 2015-12-20 DIAGNOSIS — Z5111 Encounter for antineoplastic chemotherapy: Secondary | ICD-10-CM | POA: Diagnosis present

## 2015-12-20 DIAGNOSIS — C50412 Malignant neoplasm of upper-outer quadrant of left female breast: Secondary | ICD-10-CM

## 2015-12-20 DIAGNOSIS — Z171 Estrogen receptor negative status [ER-]: Secondary | ICD-10-CM | POA: Diagnosis not present

## 2015-12-20 DIAGNOSIS — D472 Monoclonal gammopathy: Secondary | ICD-10-CM

## 2015-12-20 DIAGNOSIS — D649 Anemia, unspecified: Secondary | ICD-10-CM

## 2015-12-20 LAB — CBC WITH DIFFERENTIAL/PLATELET
BASO%: 0.9 % (ref 0.0–2.0)
Basophils Absolute: 0.1 10*3/uL (ref 0.0–0.1)
EOS ABS: 0.3 10*3/uL (ref 0.0–0.5)
EOS%: 3.9 % (ref 0.0–7.0)
HCT: 32.9 % — ABNORMAL LOW (ref 34.8–46.6)
HEMOGLOBIN: 10.6 g/dL — AB (ref 11.6–15.9)
LYMPH%: 21.5 % (ref 14.0–49.7)
MCH: 27.5 pg (ref 25.1–34.0)
MCHC: 32.2 g/dL (ref 31.5–36.0)
MCV: 85.2 fL (ref 79.5–101.0)
MONO#: 0.8 10*3/uL (ref 0.1–0.9)
MONO%: 12.6 % (ref 0.0–14.0)
NEUT#: 3.9 10*3/uL (ref 1.5–6.5)
NEUT%: 61.1 % (ref 38.4–76.8)
NRBC: 0 % (ref 0–0)
Platelets: 217 10*3/uL (ref 145–400)
RBC: 3.86 10*6/uL (ref 3.70–5.45)
RDW: 15.8 % — AB (ref 11.2–14.5)
WBC: 6.4 10*3/uL (ref 3.9–10.3)
lymph#: 1.4 10*3/uL (ref 0.9–3.3)

## 2015-12-20 LAB — COMPREHENSIVE METABOLIC PANEL
ALBUMIN: 3.6 g/dL (ref 3.5–5.0)
ALK PHOS: 79 U/L (ref 40–150)
ALT: 11 U/L (ref 0–55)
AST: 17 U/L (ref 5–34)
Anion Gap: 11 mEq/L (ref 3–11)
BUN: 21.4 mg/dL (ref 7.0–26.0)
CHLORIDE: 109 meq/L (ref 98–109)
CO2: 25 meq/L (ref 22–29)
Calcium: 9.3 mg/dL (ref 8.4–10.4)
Creatinine: 1.1 mg/dL (ref 0.6–1.1)
EGFR: 57 mL/min/{1.73_m2} — AB (ref >90)
GLUCOSE: 68 mg/dL — AB (ref 70–140)
POTASSIUM: 3.7 meq/L (ref 3.5–5.1)
SODIUM: 145 meq/L (ref 136–145)
Total Bilirubin: 0.5 mg/dL (ref 0.20–1.20)
Total Protein: 7 g/dL (ref 6.4–8.3)

## 2015-12-20 MED ORDER — FAMOTIDINE IN NACL 20-0.9 MG/50ML-% IV SOLN
20.0000 mg | Freq: Once | INTRAVENOUS | Status: AC
  Administered 2015-12-20: 20 mg via INTRAVENOUS

## 2015-12-20 MED ORDER — DIPHENHYDRAMINE HCL 50 MG/ML IJ SOLN
25.0000 mg | Freq: Once | INTRAMUSCULAR | Status: AC
  Administered 2015-12-20: 25 mg via INTRAVENOUS

## 2015-12-20 MED ORDER — SODIUM CHLORIDE 0.9 % IV SOLN
10.0000 mg | Freq: Once | INTRAVENOUS | Status: AC
  Administered 2015-12-20: 10 mg via INTRAVENOUS
  Filled 2015-12-20: qty 1

## 2015-12-20 MED ORDER — FAMOTIDINE IN NACL 20-0.9 MG/50ML-% IV SOLN
INTRAVENOUS | Status: AC
  Filled 2015-12-20: qty 50

## 2015-12-20 MED ORDER — LIDOCAINE-PRILOCAINE 2.5-2.5 % EX CREA: TOPICAL_CREAM | CUTANEOUS | 3 refills | Status: DC

## 2015-12-20 MED ORDER — DIPHENHYDRAMINE HCL 50 MG/ML IJ SOLN
INTRAMUSCULAR | Status: AC
  Filled 2015-12-20: qty 1

## 2015-12-20 MED ORDER — ONDANSETRON HCL 8 MG PO TABS: 8.0000 mg | ORAL_TABLET | Freq: Two times a day (BID) | ORAL | 1 refills | Status: DC | PRN

## 2015-12-20 MED ORDER — SODIUM CHLORIDE 0.9 % IV SOLN
Freq: Once | INTRAVENOUS | Status: AC
  Administered 2015-12-20: 12:00:00 via INTRAVENOUS

## 2015-12-20 MED ORDER — SODIUM CHLORIDE 0.9 % IV SOLN
80.0000 mg/m2 | Freq: Once | INTRAVENOUS | Status: AC
  Administered 2015-12-20: 168 mg via INTRAVENOUS
  Filled 2015-12-20: qty 28

## 2015-12-20 MED ORDER — PROCHLORPERAZINE MALEATE 10 MG PO TABS: 10.0000 mg | ORAL_TABLET | Freq: Four times a day (QID) | ORAL | 1 refills | Status: DC | PRN

## 2015-12-20 NOTE — Progress Notes (Signed)
Gave pt script for cranial prosthesis in infusion room today.

## 2015-12-20 NOTE — Progress Notes (Signed)
[place]  [phone] Fax:([phone]) [phone]  Clinic follow up Note   Patient Care Team: [name], MD as PCP - General (Internal Medicine) Valinda Party, MD as Referring Physician (Rheumatology) 12/20/2015  CHIEF COMPLAINTS:  Follow up left breast cancer   Oncology History   Malignant neoplasm of upper outer quadrant of female breast [facility])   Staging form: Breast, AJCC 7th Edition     Pathologic stage from 11/14/2015: Stage IA (T1c, N0, cM0) - Signed by Truitt Merle, MD on 11/25/2015     Clinical stage from 11/20/2015: Stage IA (T1c, N0, M0) - Signed by Curt Bears, MD on 11/20/2015       Malignant neoplasm of upper outer quadrant of female breast ([place])   10/09/2015 Mammogram    Screening bilateral mammograms showed a possible left breast mass. Diagnostic mammogram and ultrasound showed a 7 x 7 x 7 mm mass in the left breast at 11:00 position.     10/22/2015 Receptors her2    ER negative, PR negative, HER-2 not amplified     10/22/2015 Initial Biopsy    Left breast needle core biopsy at the 1:00 position showed invasive ductal carcinoma.     10/22/2015 Initial Diagnosis    Malignant neoplasm of upper outer quadrant of female breast ([place])     11/14/2015 Surgery    Left breast lumpectomy and sentinel lymph node biopsy     11/14/2015 Pathology Results    Left breast lumpectomy showed invasive ductal carcinoma, grade 3, 1.1 cm, resection margins were negative, sentinel lymph node biopsy showed benign breast parenchyma, lymph node tissue not identified. Repeated ER PR and HER-2 were all negative.      HISTORY OF PRESENTING ILLNESS:  Julie [name] 82 y.o. female is here because of her recently diagnosed left breast cancer. She has been seen my partner Dr. Julien Nordmann for MGUS, and was referred to me to discuss her adjuvant therapy for breast cancer. She is accompanied by her daughter to the clinic today.  Her breast cancer was discovered by screening mammogram.  She has no palpable breast mass, skin change or nipple discharge. She denies any new constitutional symptoms. The diagnostic mammogram and ultrasound showed a 7 mm mass in the left breast, biopsy showed invasive ductal carcinoma, triple negative. She was seen by breast surgeon Dr. [name], and underwent left rest lumpectomy and sentinel lymph node biopsy on 11/14/2015.   She had a multiple orthopedic surgeries in the past, takes tramadol as needed. She lives with her daughter, pretty independent, does not exercise regularly, but it goes out for shopping, playing cards, etc.  GYN HISTORY  Menarchal: 16 LMP: 22 (hysrectomy)  Contraceptive: 2 HRT: 1 yr  G5P5: she did breast feeding to 2 children   CURRENT THERAPY: weekly Taxol 31m/m2, started on 12/20/2015  INTERIM HISTORY: [place]ationn[place]urns for follow-up and first cycle chemotherapy. She participated the chemotherapy class, and has decided to take the chemotherapy. She has recovered well from surgery, feels well overall, no new complaints.  MEDICAL HISTORY:  Past Medical History:  Diagnosis Date  . Anemia   . Anxiety    takes Klonopin at bedtime  . Arthritis    takes Methotrexate weekly and Prednisone  . Blood transfusion    no abnormal reaction noted  . Cataracts, bilateral    immature  . Complication of anesthesia    wakes up during surgery  . Depression   . Diabetes mellitus without complication (HCC)    was on  Metformin but has been off x 6 yrs  . Fibromyalgia    takes Lyrica daily  . GERD (gastroesophageal reflux disease)    takes Nexium daily  . Glaucoma   . Hard of hearing   . Heart murmur   . Histoplasmosis   . History of colon polyps   . History of gout   . History of hiatal hernia   . History of shingles   . Hyperlipidemia   . Hypertension    takes Amlodipine,Metoprolol,and Losartan daily  . Incontinence of bowel   . Interstitial cystitis   . Joint pain   . Nocturia   . Peripheral neuropathy ([place])   . PONV  (postoperative nausea and vomiting)   . Ulcer 1970   duodenal  . Urinary frequency   . Urinary urgency   . Weakness    numbness in both hands and feet    SURGICAL HISTORY: Past Surgical History:  Procedure Laterality Date  . accupuncture  3 yrs ago  . ANKLE FUSION Left 05/10/2014   Procedure: LEFT ANKLE ARTHRODESIS ;  Surgeon: Wylene Simmer, MD;  Location: [facility];  Service: Orthopedics;  Laterality: Left;  . ANKLE SURGERY Left   . arm surgery Left    radius  . bladder tacked     . CERVICAL FUSION    . CHOLECYSTECTOMY    . COLONOSCOPY    . CYSTOCELE REPAIR    . DILATION AND CURETTAGE OF UTERUS    . ESOPHAGOGASTRODUODENOSCOPY    . RADIOACTIVE SEED GUIDED MASTECTOMY WITH AXILLARY SENTINEL LYMPH NODE BIOPSY Left 11/14/2015   Procedure: RADIOACTIVE SEED GUIDED LEFT BREAST LUMPECTOMY WITH AXILLARY SENTINEL LYMPH NODE BIOPSY;  Surgeon: Rolm Bookbinder, MD;  Location: [facility];  Service: General;  Laterality: Left;  RADIOACTIVE SEED GUIDED LEFT BREAST LUMPECTOMY WITH AXILLARY SENTINEL LYMPH NODE BIOPSY  . SALPINGOOPHORECTOMY Bilateral   . TOTAL KNEE ARTHROPLASTY Right 02/28/2015   Procedure: RIGHT TOTAL KNEE ARTHROPLASTY;  Surgeon: Rod Can, MD;  Location: [facility];  Service: Orthopedics;  Laterality: Right;  . UPPER GASTROINTESTINAL ENDOSCOPY    . VAGINAL HYSTERECTOMY      SOCIAL HISTORY: Social History   Social History  . Marital status: Divorced    Spouse name: N/A  . Number of children: N/A  . Years of education: N/A   Occupational History  . Not on file.   Social History Main Topics  . Smoking status: Never Smoker  . Smokeless tobacco: Never Used  . Alcohol use No     Comment: IN [place] ONLY  . Drug use: No  . Sexual activity: Not on file   Other Topics Concern  . Not on file   Social History Narrative  . No narrative on file    FAMILY HISTORY: Family History  Problem Relation Age of Onset  . Colon cancer Sister 27  . Esophageal cancer  Brother 47  . Breast cancer Mother     ALLERGIES:  is allergic to other and aspirin.  MEDICATIONS:  Current Outpatient Prescriptions  Medication Sig Dispense Refill  . acetaminophen (TYLENOL) 500 MG tablet Take 500 mg by mouth every 6 (six) hours as needed.    [name] amLODipine (NORVASC) 10 MG tablet Take 10 mg by mouth every morning.     . B-D TB SYRINGE 1CC/25GX5/8" 25G X 5/8" 1 ML MISC USE WITH METHOTREXATE  0  . calcium-vitamin D (OSCAL WITH D) 500-200 MG-UNIT per tablet Take 1 tablet by mouth every morning.    [name]  clonazePAM (KLONOPIN) 0.5 MG tablet Take 0.5 mg by mouth at bedtime.    [name] esomeprazole (NEXIUM) 40 MG capsule Take 40 mg by mouth daily before breakfast.      . ezetimibe (ZETIA) 10 MG tablet Take 10 mg by mouth every other day. Trial, taking every other day    . fish oil-omega-3 fatty acids 1000 MG capsule Take 1 g by mouth every morning.     . folic acid (FOLVITE) 1 MG tablet Take 2 mg by mouth every morning.     . metFORMIN (GLUCOPHAGE) 500 MG tablet Take by mouth 2 (two) times daily with a meal.    . methotrexate (50 MG/ML) 1 g injection Inject into the skin once a week.    . metoprolol succinate (TOPROL-XL) 50 MG 24 hr tablet TAKE 1 TABLET AT BEDTIME ONCE A DAY ORALLY 90 DAYS  2  . predniSONE (DELTASONE) 5 MG tablet Take 5 mg by mouth every other day.     . pregabalin (LYRICA) 75 MG capsule Take 75 mg by mouth 2 (two) times daily.    . valsartan-hydrochlorothiazide (DIOVAN-HCT) 320-12.5 MG per tablet Take 1 tablet by mouth daily.    . vitamin E 400 UNIT capsule Take 400 Units by mouth daily.    [name] HYDROcodone-acetaminophen (NORCO) 10-325 MG tablet Take 1 tablet by mouth every 6 (six) hours as needed. (Patient not taking: Reported on 11/25/2015) 20 tablet 0  . lidocaine-prilocaine (EMLA) cream Apply to affected area once 30 g 3  . meloxicam (MOBIC) 15 MG tablet Take 15 mg by mouth daily.  11  . meloxicam (MOBIC) 7.5 MG tablet Take 7.5 mg by mouth daily.    . ondansetron (ZOFRAN)  8 MG tablet Take 1 tablet (8 mg total) by mouth 2 (two) times daily as needed (Nausea or vomiting). 30 tablet 1  . prochlorperazine (COMPAZINE) 10 MG tablet Take 1 tablet (10 mg total) by mouth every 6 (six) hours as needed (Nausea or vomiting). 30 tablet 1  . traMADol (ULTRAM) 50 MG tablet Take by mouth every 6 (six) hours as needed. Reported on 11/25/2015     No current facility-administered medications for this visit.     REVIEW OF SYSTEMS:   Constitutional: Denies fevers, chills or abnormal night sweats Eyes: Denies blurriness of vision, double vision or watery eyes Ears, nose, mouth, throat, and face: Denies mucositis or sore throat Respiratory: Denies cough, dyspnea or wheezes Cardiovascular: Denies palpitation, chest discomfort or lower extremity swelling Gastrointestinal:  Denies nausea, heartburn or change in bowel habits Skin: Denies abnormal skin rashes Lymphatics: Denies new lymphadenopathy or easy bruising Neurological:Denies numbness, tingling or new weaknesses Behavioral/Psych: Mood is stable, no new changes  All other systems were reviewed with the patient and are negative.  PHYSICAL EXAMINATION: ECOG PERFORMANCE STATUS: 1 - Symptomatic but completely ambulatory  Vitals:   12/20/15 1025 12/20/15 1041  BP: (!) 167/76   Pulse: (!) 57   Resp: 17   Temp:  97.8 F (36.6 C)   Filed Weights   12/20/15 1025  Weight: 217 lb 8 oz (98.7 kg)    GENERAL:alert, no distress and comfortable SKIN: skin color, texture, turgor are normal, no rashes or significant lesions EYES: normal, conjunctiva are pink and non-injected, sclera clear OROPHARYNX:no exudate, no erythema and lips, buccal mucosa, and tongue normal  NECK: supple, thyroid normal size, non-tender, without nodularity LYMPH:  no palpable lymphadenopathy in the cervical, axillary or inguinal LUNGS: clear to auscultation and percussion with normal breathing effort  HEART: regular rate & rhythm and no murmurs and no lower  extremity edema ABDOMEN:abdomen soft, non-tender and normal bowel sounds Musculoskeletal:no cyanosis of digits and no clubbing  PSYCH: alert & oriented x 3 with fluent speech NEURO: no focal motor/sensory deficits Breasts: Breast inspection showed them to be symmetrical with no nipple discharge. Surgical incisions in the left breast and axilla are healing well. No skin erythema or discharge. Palpation of the breasts and axilla revealed no obvious mass that I could appreciate.   LABORATORY DATA:  I have reviewed the data as listed CBC Latest Ref Rng & Units 12/20/2015 11/11/2015 03/02/2015  WBC 3.9 - 10.3 10e3/uL 6.4 5.0 8.5  Hemoglobin 11.6 - 15.9 g/dL 10.6(L) 10.8(L) 7.7(L)  Hematocrit 34.8 - 46.6 % 32.9(L) 33.6(L) 24.3(L)  Platelets 145 - 400 10e3/uL 217 213 189   CMP Latest Ref Rng & Units 12/20/2015 11/11/2015 03/01/2015  Glucose 70 - 140 mg/dl 68(L) 84 132(H)  BUN 7.0 - 26.0 mg/dL 21.4 33.2(H) 24(H)  Creatinine 0.6 - 1.1 mg/dL 1.1 1.1 1.10(H)  Sodium 136 - 145 mEq/L 145 145 140  Potassium 3.5 - 5.1 mEq/L 3.7 4.3 4.3  Chloride 101 - 111 mmol/L - - 108  CO2 22 - 29 mEq/L 25 30(H) 26  Calcium 8.4 - 10.4 mg/dL 9.3 9.7 8.4(L)  Total Protein 6.4 - 8.3 g/dL 7.0 6.9 -  Total Bilirubin 0.20 - 1.20 mg/dL 0.50 0.59 -  Alkaline Phos 40 - 150 U/L 79 61 -  AST 5 - 34 U/L 17 20 -  ALT 0 - 55 U/L 11 15 -    PATHOLOGY REPORT  Diagnosis 11/14/2015 1. Breast, lumpectomy, Left - INVASIVE DUCTAL CARCINOMA, GRADE III/III, SPANNING 1.1 CM. - THE RESECTION MARGINS ARE NEGATIVE FOR CARCINOMA. - SEE ONCOLOGY TABLE BELOW. 2. Lymph node, sentinel, biopsy, Left Axillary - BENIGN BREAST PARENCHYMA. - LYMPH NODAL TISSUE IS NOT IDENTIFIED. - THERE IS NO EVIDENCE OF MALIGNANCY. Microscopic Comment 1. BREAST, INVASIVE TUMOR, WITH LYMPH NODES PRESENT Specimen, including laterality and lymph node sampling (sentinel, non-sentinel): Left breast Procedure: Seed localized lumpectomy Histologic type:  Ductal Grade: III Tubule formation: 2 Nuclear pleomorphism: 3 Mitotic: 3 Tumor size (gross measurement): 1.1 cm Margins: Greater than 0.2 cm to all margins Lymphovascular invasion: Not identified Ductal carcinoma in situ: Not identified. Lobular neoplasia: Not identified. Tumor focality: Unifocal Treatment effect: N/A Extent of tumor: Confined to breast parenchyma Lymph nodes: None examined. Breast prognostic profile: Case [id number] Estrogen receptor: 0% Progesterone receptor: 0% Her 2 neu: No amplification was detected. Ki-67: 80% A breast prognostic profile will be repeated on the case and the results reported separately. Non-neoplastic breast: No significant findings. TNM: pT1c, pNX (JBK:kh 11-15-15) 2 of 4 FINAL for Phillips, [place] L ([id number]) Enid Cutter MD Pathologist, Electronic  Results: IMMUNOHISTOCHEMICAL AND MORPHOMETRIC ANALYSIS PERFORMED MANUALLY Estrogen Receptor: 0%, NEGATIVE Progesterone Receptor: 0%, NEGATIVE  Results: HER2 - NEGATIVE RATIO OF HER2/CEP17 SIGNALS 1.32 AVERAGE HER2 COPY NUMBER PER CELL 3.10   RADIOGRAPHIC STUDIES: I have personally reviewed the radiological images as listed and agreed with the findings in the report. No results found.  ASSESSMENT & PLAN:  82 year old female, with past medical history of MGUS, hypertension, arthritis, with good performance status, was found to have stage I triple negative left breast cancer by screening mammogram.  1. Malignant or new present of upper outer quadrant of left female breast, invasive ductal carcinoma, grade 3, pT1cN0M0, stage IA, ER-/PR-/HER2- --I discussed her breast mammogram, ultrasound, biopsy and final surgical path result  in details -She had sentinel lymph node biopsy, however it showed normal breast parenchyma, no lymphoid tissue, her pathological node status is unknown, no ultrasound evidence of adenopathy. -We reviewed than aggressive nature of triple negative breast cancer.  Giving her stage I disease, I think she probably has 20-30% risk of distant recurrence in the next 5-10 years.  -I recommend her weekly Taxol for 12 weeks as adjuvant chemotherapy to reduce her risk of cancer recurrence. --Chemotherapy consent: Side effects including but does not not limited to, fatigue, nausea, vomiting, diarrhea, hair loss, neuropathy, fluid retention, renal and kidney dysfunction, neutropenic fever, needed for blood transfusion, bleeding, were discussed with patient in great detail. She agrees to proceed. -Lab results reviewed with her, mild anemia, stable, proceed to cycle 1 Taxol today. -Due to her poor IV access, she agreed to have a port placement in the next few weeks.  2. MGUS -will continue follow up SPEP/IFE, and light chain levels every 3 month   3. Anemia  -she has chronic anemia, worse last year due to her surgery  -will check her iron study and folic acid, Q77 level next time   Plan -first cycle weekly taxol today  -port placement by Dr. [name]  -I called in Zofran, Compazine and EMLA cream to her pharmacy today -I'll see her back in 2 weeks  All questions were answered. The patient knows to call the clinic with any problems, questions or concerns.  I spent 25 minutes counseling the patient face to face. The total time spent in the appointment was 30 minutes and more than 50% was on counseling.     Truitt Merle, MD 12/20/2015

## 2015-12-20 NOTE — Patient Instructions (Addendum)
[facility] Discharge Instructions for Patients Receiving Chemotherapy  Today you received the following chemotherapy agent: Taxol.  To help prevent nausea and vomiting after your treatment, we encourage you to take your nausea medication as prescribed.  If you develop nausea and vomiting that is not controlled by your nausea medication, call the clinic.   BELOW ARE SYMPTOMS THAT SHOULD BE REPORTED IMMEDIATELY:  *FEVER GREATER THAN 100.5 F  *CHILLS WITH OR WITHOUT FEVER  NAUSEA AND VOMITING THAT IS NOT CONTROLLED WITH YOUR NAUSEA MEDICATION  *UNUSUAL SHORTNESS OF BREATH  *UNUSUAL BRUISING OR BLEEDING  TENDERNESS IN MOUTH AND THROAT WITH OR WITHOUT PRESENCE OF ULCERS  *URINARY PROBLEMS  *BOWEL PROBLEMS  UNUSUAL RASH Items with * indicate a potential emergency and should be followed up as soon as possible.  Feel free to call the clinic you have any questions or concerns. The clinic phone number is [phone].  Please show the [facility] at check-in to the Emergency Department and triage nurse.  Paclitaxel injection What is this medicine? PACLITAXEL (PAK li TAX el) is a chemotherapy drug. It targets fast dividing cells, like cancer cells, and causes these cells to die. This medicine is used to treat ovarian cancer, breast cancer, and other cancers. This medicine may be used for other purposes; ask your health care provider or pharmacist if you have questions. What should I tell my health care provider before I take this medicine? They need to know if you have any of these conditions: -blood disorders -irregular heartbeat -infection (especially a virus infection such as chickenpox, cold sores, or herpes) -liver disease -previous or ongoing radiation therapy -an unusual or allergic reaction to paclitaxel, alcohol, polyoxyethylated castor oil, other chemotherapy agents, other medicines, foods, dyes, or preservatives -pregnant or trying to get  pregnant -breast-feeding How should I use this medicine? This drug is given as an infusion into a vein. It is administered in a hospital or clinic by a specially trained health care professional. Talk to your pediatrician regarding the use of this medicine in children. Special care may be needed. Overdosage: If you think you have taken too much of this medicine contact a poison control center or emergency room at once. NOTE: This medicine is only for you. Do not share this medicine with others. What if I miss a dose? It is important not to miss your dose. Call your doctor or health care professional if you are unable to keep an appointment. What may interact with this medicine? Do not take this medicine with any of the following medications: -disulfiram -metronidazole This medicine may also interact with the following medications: -cyclosporine -diazepam -ketoconazole -medicines to increase blood counts like filgrastim, pegfilgrastim, sargramostim -other chemotherapy drugs like cisplatin, doxorubicin, epirubicin, etoposide, teniposide, vincristine -quinidine -testosterone -vaccines -verapamil Talk to your doctor or health care professional before taking any of these medicines: -acetaminophen -aspirin -ibuprofen -ketoprofen -naproxen This list may not describe all possible interactions. Give your health care provider a list of all the medicines, herbs, non-prescription drugs, or dietary supplements you use. Also tell them if you smoke, drink alcohol, or use illegal drugs. Some items may interact with your medicine. What should I watch for while using this medicine? Your condition will be monitored carefully while you are receiving this medicine. You will need important blood work done while you are taking this medicine. This drug may make you feel generally unwell. This is not uncommon, as chemotherapy can affect healthy cells as well as cancer cells.  Report any side effects. Continue  your course of treatment even though you feel ill unless your doctor tells you to stop. This medicine can cause serious allergic reactions. To reduce your risk you will need to take other medicine(s) before treatment with this medicine. In some cases, you may be given additional medicines to help with side effects. Follow all directions for their use. Call your doctor or health care professional for advice if you get a fever, chills or sore throat, or other symptoms of a cold or flu. Do not treat yourself. This drug decreases your body's ability to fight infections. Try to avoid being around people who are sick. This medicine may increase your risk to bruise or bleed. Call your doctor or health care professional if you notice any unusual bleeding. Be careful brushing and flossing your teeth or using a toothpick because you may get an infection or bleed more easily. If you have any dental work done, tell your dentist you are receiving this medicine. Avoid taking products that contain aspirin, acetaminophen, ibuprofen, naproxen, or ketoprofen unless instructed by your doctor. These medicines may hide a fever. Do not become pregnant while taking this medicine. Women should inform their doctor if they wish to become pregnant or think they might be pregnant. There is a potential for serious side effects to an unborn child. Talk to your health care professional or pharmacist for more information. Do not breast-feed an infant while taking this medicine. Men are advised not to father a child while receiving this medicine. This product may contain alcohol. Ask your pharmacist or healthcare provider if this medicine contains alcohol. Be sure to tell all healthcare providers you are taking this medicine. Certain medicines, like metronidazole and disulfiram, can cause an unpleasant reaction when taken with alcohol. The reaction includes flushing, headache, nausea, vomiting, sweating, and increased thirst. The reaction  can last from 30 minutes to several hours. What side effects may I notice from receiving this medicine? Side effects that you should report to your doctor or health care professional as soon as possible: -allergic reactions like skin rash, itching or hives, swelling of the face, lips, or tongue -low blood counts - This drug may decrease the number of white blood cells, red blood cells and platelets. You may be at increased risk for infections and bleeding. -signs of infection - fever or chills, cough, sore throat, pain or difficulty passing urine -signs of decreased platelets or bleeding - bruising, pinpoint red spots on the skin, black, tarry stools, nosebleeds -signs of decreased red blood cells - unusually weak or tired, fainting spells, lightheadedness -breathing problems -chest pain -high or low blood pressure -mouth sores -nausea and vomiting -pain, swelling, redness or irritation at the injection site -pain, tingling, numbness in the hands or feet -slow or irregular heartbeat -swelling of the ankle, feet, hands Side effects that usually do not require medical attention (report to your doctor or health care professional if they continue or are bothersome): -bone pain -complete hair loss including hair on your head, underarms, pubic hair, eyebrows, and eyelashes -changes in the color of fingernails -diarrhea -loosening of the fingernails -loss of appetite -muscle or joint pain -red flush to skin -sweating This list may not describe all possible side effects. Call your doctor for medical advice about side effects. You may report side effects to FDA at 1-800-FDA-1088. Where should I keep my medicine? This drug is given in a hospital or clinic and will not be stored at home. NOTE: This  sheet is a summary. It may not cover all possible information. If you have questions about this medicine, talk to your doctor, pharmacist, or health care provider.    2016, [employer]vier/[employer].  (2014-12-27 13:02:56)

## 2015-12-23 ENCOUNTER — Telehealth: Payer: Self-pay | Admitting: *Deleted

## 2015-12-23 ENCOUNTER — Encounter (HOSPITAL_BASED_OUTPATIENT_CLINIC_OR_DEPARTMENT_OTHER): Payer: Self-pay | Admitting: *Deleted

## 2015-12-23 ENCOUNTER — Other Ambulatory Visit: Payer: Self-pay | Admitting: General Surgery

## 2015-12-23 LAB — KAPPA/LAMBDA LIGHT CHAINS
IG LAMBDA FREE LIGHT CHAIN: 12.8 mg/L (ref 5.7–26.3)
Ig Kappa Free Light Chain: 40.3 mg/L — ABNORMAL HIGH (ref 3.3–19.4)
KAPPA/LAMBDA FLC RATIO: 3.15 — AB (ref 0.26–1.65)

## 2015-12-23 NOTE — Telephone Encounter (Signed)
Spoke to both pt and daughter concerning port placement and chemo on 12/27/15. Discussed after port placed at [name] pt is to come to cancer center of chemo. Informed infusion room pt may be slightly late d/t port being placed. No questions voiced at this time. Contact information provided.

## 2015-12-25 LAB — MULTIPLE MYELOMA PANEL, SERUM
ALBUMIN/GLOB SERPL: 1.4 (ref 0.7–1.7)
ALPHA2 GLOB SERPL ELPH-MCNC: 0.6 g/dL (ref 0.4–1.0)
Albumin SerPl Elph-Mcnc: 3.8 g/dL (ref 2.9–4.4)
Alpha 1: 0.2 g/dL (ref 0.0–0.4)
B-GLOBULIN SERPL ELPH-MCNC: 1.5 g/dL — AB (ref 0.7–1.3)
Gamma Glob SerPl Elph-Mcnc: 0.6 g/dL (ref 0.4–1.8)
Globulin, Total: 2.8 g/dL (ref 2.2–3.9)
IGG (IMMUNOGLOBIN G), SERUM: 667 mg/dL — AB (ref 700–1600)
IgA, Qn, Serum: 783 mg/dL — ABNORMAL HIGH (ref 64–422)
IgM, Qn, Serum: 42 mg/dL (ref 26–217)
M Protein SerPl Elph-Mcnc: 0.9 g/dL — ABNORMAL HIGH
TOTAL PROTEIN: 6.6 g/dL (ref 6.0–8.5)

## 2015-12-26 DIAGNOSIS — N390 Urinary tract infection, site not specified: Secondary | ICD-10-CM | POA: Diagnosis not present

## 2015-12-26 DIAGNOSIS — E119 Type 2 diabetes mellitus without complications: Secondary | ICD-10-CM | POA: Diagnosis not present

## 2015-12-26 DIAGNOSIS — R300 Dysuria: Secondary | ICD-10-CM | POA: Diagnosis not present

## 2015-12-26 DIAGNOSIS — I10 Essential (primary) hypertension: Secondary | ICD-10-CM | POA: Diagnosis not present

## 2015-12-27 ENCOUNTER — Encounter (HOSPITAL_BASED_OUTPATIENT_CLINIC_OR_DEPARTMENT_OTHER): Payer: Self-pay | Admitting: Anesthesiology

## 2015-12-27 ENCOUNTER — Ambulatory Visit (HOSPITAL_BASED_OUTPATIENT_CLINIC_OR_DEPARTMENT_OTHER)
Admission: RE | Admit: 2015-12-27 | Discharge: 2015-12-27 | Disposition: A | Discharge status: Home / Self Care | Payer: Medicare Other | Source: Ambulatory Visit | Attending: General Surgery | Admitting: General Surgery

## 2015-12-27 ENCOUNTER — Ambulatory Visit: Payer: Medicare Other

## 2015-12-27 ENCOUNTER — Ambulatory Visit (HOSPITAL_COMMUNITY): Payer: Medicare Other

## 2015-12-27 ENCOUNTER — Ambulatory Visit (HOSPITAL_BASED_OUTPATIENT_CLINIC_OR_DEPARTMENT_OTHER): Admit: 2015-12-27 | Payer: Self-pay | Admitting: General Surgery

## 2015-12-27 ENCOUNTER — Encounter (HOSPITAL_BASED_OUTPATIENT_CLINIC_OR_DEPARTMENT_OTHER)
Admission: RE | Disposition: A | Discharge status: Home / Self Care | Payer: Self-pay | Source: Ambulatory Visit | Attending: General Surgery

## 2015-12-27 ENCOUNTER — Telehealth: Payer: Self-pay | Admitting: *Deleted

## 2015-12-27 ENCOUNTER — Ambulatory Visit (HOSPITAL_BASED_OUTPATIENT_CLINIC_OR_DEPARTMENT_OTHER): Payer: Medicare Other | Admitting: Anesthesiology

## 2015-12-27 ENCOUNTER — Other Ambulatory Visit: Payer: Medicare Other

## 2015-12-27 DIAGNOSIS — Z171 Estrogen receptor negative status [ER-]: Secondary | ICD-10-CM | POA: Diagnosis not present

## 2015-12-27 DIAGNOSIS — Z452 Encounter for adjustment and management of vascular access device: Secondary | ICD-10-CM | POA: Diagnosis not present

## 2015-12-27 DIAGNOSIS — M797 Fibromyalgia: Secondary | ICD-10-CM | POA: Insufficient documentation

## 2015-12-27 DIAGNOSIS — Z96651 Presence of right artificial knee joint: Secondary | ICD-10-CM | POA: Diagnosis not present

## 2015-12-27 DIAGNOSIS — F419 Anxiety disorder, unspecified: Secondary | ICD-10-CM | POA: Insufficient documentation

## 2015-12-27 DIAGNOSIS — C50412 Malignant neoplasm of upper-outer quadrant of left female breast: Secondary | ICD-10-CM | POA: Insufficient documentation

## 2015-12-27 DIAGNOSIS — K219 Gastro-esophageal reflux disease without esophagitis: Secondary | ICD-10-CM | POA: Diagnosis not present

## 2015-12-27 DIAGNOSIS — E119 Type 2 diabetes mellitus without complications: Secondary | ICD-10-CM | POA: Diagnosis not present

## 2015-12-27 DIAGNOSIS — Z79899 Other long term (current) drug therapy: Secondary | ICD-10-CM | POA: Insufficient documentation

## 2015-12-27 DIAGNOSIS — C50912 Malignant neoplasm of unspecified site of left female breast: Secondary | ICD-10-CM | POA: Diagnosis not present

## 2015-12-27 DIAGNOSIS — E785 Hyperlipidemia, unspecified: Secondary | ICD-10-CM | POA: Insufficient documentation

## 2015-12-27 DIAGNOSIS — E1140 Type 2 diabetes mellitus with diabetic neuropathy, unspecified: Secondary | ICD-10-CM | POA: Insufficient documentation

## 2015-12-27 DIAGNOSIS — Z7952 Long term (current) use of systemic steroids: Secondary | ICD-10-CM | POA: Diagnosis not present

## 2015-12-27 DIAGNOSIS — M1990 Unspecified osteoarthritis, unspecified site: Secondary | ICD-10-CM | POA: Diagnosis not present

## 2015-12-27 DIAGNOSIS — Z7984 Long term (current) use of oral hypoglycemic drugs: Secondary | ICD-10-CM | POA: Diagnosis not present

## 2015-12-27 DIAGNOSIS — I10 Essential (primary) hypertension: Secondary | ICD-10-CM | POA: Insufficient documentation

## 2015-12-27 DIAGNOSIS — Z419 Encounter for procedure for purposes other than remedying health state, unspecified: Secondary | ICD-10-CM

## 2015-12-27 DIAGNOSIS — Z95828 Presence of other vascular implants and grafts: Secondary | ICD-10-CM

## 2015-12-27 HISTORY — PX: PORTACATH PLACEMENT: SHX2246

## 2015-12-27 HISTORY — PX: PORT A CATH REVISION: SHX6033

## 2015-12-27 LAB — GLUCOSE, CAPILLARY
Glucose-Capillary: 90 mg/dL (ref 65–99)
Glucose-Capillary: 101 mg/dL — ABNORMAL HIGH (ref 65–99)

## 2015-12-27 SURGERY — REVISION, PORT-A-CATH INSERTION
Anesthesia: Regional

## 2015-12-27 SURGERY — INSERTION, TUNNELED CENTRAL VENOUS DEVICE, WITH PORT
Anesthesia: Regional | Laterality: Right

## 2015-12-27 MED ORDER — FENTANYL CITRATE (PF) 100 MCG/2ML IJ SOLN
50.0000 ug | INTRAMUSCULAR | Status: DC | PRN
  Administered 2015-12-27: 50 ug via INTRAVENOUS

## 2015-12-27 MED ORDER — EPHEDRINE 5 MG/ML INJ
INTRAVENOUS | Status: AC
  Filled 2015-12-27: qty 10

## 2015-12-27 MED ORDER — METHYLENE BLUE 0.5 % INJ SOLN
INTRAVENOUS | Status: AC
  Filled 2015-12-27: qty 10

## 2015-12-27 MED ORDER — LACTATED RINGERS IV SOLN
INTRAVENOUS | Status: DC
  Administered 2015-12-27 (×4): via INTRAVENOUS

## 2015-12-27 MED ORDER — PROPOFOL 10 MG/ML IV BOLUS
INTRAVENOUS | Status: DC | PRN
  Administered 2015-12-27: 120 mg via INTRAVENOUS

## 2015-12-27 MED ORDER — ACETAMINOPHEN 500 MG PO TABS
ORAL_TABLET | ORAL | Status: AC
  Filled 2015-12-27: qty 2

## 2015-12-27 MED ORDER — MIDAZOLAM HCL 2 MG/2ML IJ SOLN: 1.0000 mg | INTRAMUSCULAR | Status: DC | PRN

## 2015-12-27 MED ORDER — LIDOCAINE 2% (20 MG/ML) 5 ML SYRINGE
INTRAMUSCULAR | Status: DC | PRN
  Administered 2015-12-27: 50 mg via INTRAVENOUS

## 2015-12-27 MED ORDER — SCOPOLAMINE 1 MG/3DAYS TD PT72: 1.0000 | MEDICATED_PATCH | Freq: Once | TRANSDERMAL | Status: DC | PRN

## 2015-12-27 MED ORDER — GLYCOPYRROLATE 0.2 MG/ML IJ SOLN: 0.2000 mg | Freq: Once | INTRAMUSCULAR | Status: DC | PRN

## 2015-12-27 MED ORDER — ACETAMINOPHEN 500 MG PO TABS
1000.0000 mg | ORAL_TABLET | ORAL | Status: AC
  Administered 2015-12-27: 1000 mg via ORAL

## 2015-12-27 MED ORDER — EPHEDRINE SULFATE-NACL 50-0.9 MG/10ML-% IV SOSY
PREFILLED_SYRINGE | INTRAVENOUS | Status: DC | PRN
  Administered 2015-12-27: 10 mg via INTRAVENOUS

## 2015-12-27 MED ORDER — HEPARIN (PORCINE) IN NACL 2-0.9 UNIT/ML-% IJ SOLN
INTRAMUSCULAR | Status: DC | PRN
  Administered 2015-12-27: 1 via INTRAVENOUS

## 2015-12-27 MED ORDER — DEXAMETHASONE SODIUM PHOSPHATE 10 MG/ML IJ SOLN
INTRAMUSCULAR | Status: AC
  Filled 2015-12-27: qty 1

## 2015-12-27 MED ORDER — FENTANYL CITRATE (PF) 100 MCG/2ML IJ SOLN
INTRAMUSCULAR | Status: AC
  Filled 2015-12-27: qty 2

## 2015-12-27 MED ORDER — FENTANYL CITRATE (PF) 100 MCG/2ML IJ SOLN
25.0000 ug | INTRAMUSCULAR | Status: DC | PRN
  Administered 2015-12-27: 25 ug via INTRAVENOUS
  Administered 2015-12-27 (×2): 50 ug via INTRAVENOUS

## 2015-12-27 MED ORDER — HEPARIN (PORCINE) IN NACL 2-0.9 UNIT/ML-% IJ SOLN
INTRAMUSCULAR | Status: AC
  Filled 2015-12-27: qty 500

## 2015-12-27 MED ORDER — GABAPENTIN 300 MG PO CAPS
ORAL_CAPSULE | ORAL | Status: AC
  Filled 2015-12-27: qty 1

## 2015-12-27 MED ORDER — SODIUM CHLORIDE 0.9 % IJ SOLN
INTRAMUSCULAR | Status: AC
  Filled 2015-12-27: qty 10

## 2015-12-27 MED ORDER — DEXAMETHASONE SODIUM PHOSPHATE 4 MG/ML IJ SOLN
INTRAMUSCULAR | Status: DC | PRN
  Administered 2015-12-27: 10 mg via INTRAVENOUS

## 2015-12-27 MED ORDER — PROPOFOL 10 MG/ML IV BOLUS
INTRAVENOUS | Status: DC | PRN
  Administered 2015-12-27: 150 mg via INTRAVENOUS

## 2015-12-27 MED ORDER — PROPOFOL 500 MG/50ML IV EMUL
INTRAVENOUS | Status: AC
  Filled 2015-12-27: qty 50

## 2015-12-27 MED ORDER — HEPARIN SOD (PORK) LOCK FLUSH 100 UNIT/ML IV SOLN
INTRAVENOUS | Status: AC
  Filled 2015-12-27: qty 5

## 2015-12-27 MED ORDER — BUPIVACAINE HCL (PF) 0.25 % IJ SOLN
INTRAMUSCULAR | Status: AC
  Filled 2015-12-27: qty 60

## 2015-12-27 MED ORDER — ONDANSETRON HCL 4 MG/2ML IJ SOLN
INTRAMUSCULAR | Status: AC
  Filled 2015-12-27: qty 2

## 2015-12-27 MED ORDER — PROMETHAZINE HCL 25 MG/ML IJ SOLN: 6.2500 mg | INTRAMUSCULAR | Status: DC | PRN

## 2015-12-27 MED ORDER — EPHEDRINE SULFATE 50 MG/ML IJ SOLN
INTRAMUSCULAR | Status: DC | PRN
  Administered 2015-12-27: 10 mg via INTRAVENOUS

## 2015-12-27 MED ORDER — BUPIVACAINE HCL (PF) 0.25 % IJ SOLN
INTRAMUSCULAR | Status: DC | PRN
  Administered 2015-12-27: 8 mL

## 2015-12-27 MED ORDER — GABAPENTIN 300 MG PO CAPS
300.0000 mg | ORAL_CAPSULE | ORAL | Status: AC
  Administered 2015-12-27: 300 mg via ORAL

## 2015-12-27 MED ORDER — LIDOCAINE 2% (20 MG/ML) 5 ML SYRINGE
INTRAMUSCULAR | Status: DC | PRN
  Administered 2015-12-27: 60 mg via INTRAVENOUS

## 2015-12-27 MED ORDER — PROPOFOL 10 MG/ML IV BOLUS
INTRAVENOUS | Status: AC
  Filled 2015-12-27: qty 20

## 2015-12-27 MED ORDER — CEFAZOLIN SODIUM-DEXTROSE 2-4 GM/100ML-% IV SOLN
INTRAVENOUS | Status: AC
  Filled 2015-12-27: qty 100

## 2015-12-27 MED ORDER — GLYCOPYRROLATE 0.2 MG/ML IV SOSY
PREFILLED_SYRINGE | INTRAVENOUS | Status: DC | PRN
  Administered 2015-12-27: .2 mg via INTRAVENOUS

## 2015-12-27 MED ORDER — IOPAMIDOL (ISOVUE-300) INJECTION 61%
INTRAVENOUS | Status: AC
  Filled 2015-12-27: qty 50

## 2015-12-27 MED ORDER — LIDOCAINE 2% (20 MG/ML) 5 ML SYRINGE
INTRAMUSCULAR | Status: AC
  Filled 2015-12-27: qty 5

## 2015-12-27 MED ORDER — ONDANSETRON HCL 4 MG/2ML IJ SOLN
INTRAMUSCULAR | Status: DC | PRN
  Administered 2015-12-27: 4 mg via INTRAVENOUS

## 2015-12-27 MED ORDER — CEFAZOLIN SODIUM-DEXTROSE 2-4 GM/100ML-% IV SOLN
2.0000 g | INTRAVENOUS | Status: AC
  Administered 2015-12-27: 2 g via INTRAVENOUS

## 2015-12-27 MED ORDER — BUPIVACAINE HCL (PF) 0.5 % IJ SOLN
INTRAMUSCULAR | Status: AC
  Filled 2015-12-27: qty 30

## 2015-12-27 MED ORDER — GLYCOPYRROLATE 0.2 MG/ML IV SOSY
PREFILLED_SYRINGE | INTRAVENOUS | Status: AC
  Filled 2015-12-27: qty 3

## 2015-12-27 MED ORDER — BUPIVACAINE HCL (PF) 0.25 % IJ SOLN
INTRAMUSCULAR | Status: DC | PRN
  Administered 2015-12-27: 7 mL

## 2015-12-27 MED ORDER — BUPIVACAINE HCL (PF) 0.25 % IJ SOLN
INTRAMUSCULAR | Status: AC
  Filled 2015-12-27: qty 30

## 2015-12-27 SURGICAL SUPPLY — 49 items
BAG DECANTER FOR FLEXI CONT (MISCELLANEOUS) ×3 IMPLANT
BENZOIN TINCTURE PRP APPL 2/3 (GAUZE/BANDAGES/DRESSINGS) ×3 IMPLANT
BLADE SURG 11 STRL SS (BLADE) ×3 IMPLANT
BLADE SURG 15 STRL LF DISP TIS (BLADE) ×1 IMPLANT
BLADE SURG 15 STRL SS (BLADE) ×2
CANISTER SUCT 1200ML W/VALVE (MISCELLANEOUS) IMPLANT
CHLORAPREP W/TINT 26ML (MISCELLANEOUS) ×3 IMPLANT
CLOSURE WOUND 1/2 X4 (GAUZE/BANDAGES/DRESSINGS) ×1
COVER BACK TABLE 60X90IN (DRAPES) ×3 IMPLANT
COVER MAYO STAND STRL (DRAPES) ×3 IMPLANT
COVER PROBE 5X48 (MISCELLANEOUS)
DECANTER SPIKE VIAL GLASS SM (MISCELLANEOUS) IMPLANT
DRAPE C-ARM 42X72 X-RAY (DRAPES) ×3 IMPLANT
DRAPE LAPAROSCOPIC ABDOMINAL (DRAPES) ×3 IMPLANT
DRAPE UTILITY XL STRL (DRAPES) ×3 IMPLANT
DRSG TEGADERM 4X4.75 (GAUZE/BANDAGES/DRESSINGS) IMPLANT
ELECT COATED BLADE 2.86 ST (ELECTRODE) ×3 IMPLANT
ELECT REM PT RETURN 9FT ADLT (ELECTROSURGICAL) ×3
ELECTRODE REM PT RTRN 9FT ADLT (ELECTROSURGICAL) ×1 IMPLANT
GLOVE BIO SURGEON STRL SZ7 (GLOVE) ×3 IMPLANT
GLOVE BIOGEL PI IND STRL 7.5 (GLOVE) ×3 IMPLANT
GLOVE BIOGEL PI INDICATOR 7.5 (GLOVE) ×6
GLOVE SURG SS PI 7.5 STRL IVOR (GLOVE) ×6 IMPLANT
GOWN STRL REUS W/ TWL LRG LVL3 (GOWN DISPOSABLE) ×3 IMPLANT
GOWN STRL REUS W/TWL LRG LVL3 (GOWN DISPOSABLE) ×6
IV KIT MINILOC 20X1 SAFETY (NEEDLE) IMPLANT
KIT CVR 48X5XPRB PLUP LF (MISCELLANEOUS) IMPLANT
LIQUID BAND (GAUZE/BANDAGES/DRESSINGS) ×3 IMPLANT
NDL SAFETY ECLIPSE 18X1.5 (NEEDLE) IMPLANT
NEEDLE HYPO 18GX1.5 SHARP (NEEDLE)
NEEDLE HYPO 25X1 1.5 SAFETY (NEEDLE) ×3 IMPLANT
PACK BASIN DAY SURGERY FS (CUSTOM PROCEDURE TRAY) ×3 IMPLANT
PENCIL BUTTON HOLSTER BLD 10FT (ELECTRODE) ×3 IMPLANT
SLEEVE SCD COMPRESS KNEE MED (MISCELLANEOUS) ×3 IMPLANT
SPONGE GAUZE 4X4 12PLY STER LF (GAUZE/BANDAGES/DRESSINGS) ×3 IMPLANT
STRIP CLOSURE SKIN 1/2X4 (GAUZE/BANDAGES/DRESSINGS) ×2 IMPLANT
SUT MON AB 4-0 PC3 18 (SUTURE) ×3 IMPLANT
SUT PROLENE 2 0 SH DA (SUTURE) ×3 IMPLANT
SUT SILK 2 0 TIES 17X18 (SUTURE)
SUT SILK 2-0 18XBRD TIE BLK (SUTURE) IMPLANT
SUT VIC AB 3-0 SH 27 (SUTURE) ×2
SUT VIC AB 3-0 SH 27X BRD (SUTURE) ×1 IMPLANT
SYR 5ML LUER SLIP (SYRINGE) ×3 IMPLANT
SYR CONTROL 10ML LL (SYRINGE) ×3 IMPLANT
TOWEL OR 17X24 6PK STRL BLUE (TOWEL DISPOSABLE) ×3 IMPLANT
TOWEL OR NON WOVEN STRL DISP B (DISPOSABLE) IMPLANT
TUBE CONNECTING 20'X1/4 (TUBING)
TUBE CONNECTING 20X1/4 (TUBING) IMPLANT
YANKAUER SUCT BULB TIP NO VENT (SUCTIONS) IMPLANT

## 2015-12-27 SURGICAL SUPPLY — 45 items
BAG DECANTER FOR FLEXI CONT (MISCELLANEOUS) ×3 IMPLANT
BLADE SURG 11 STRL SS (BLADE) ×3 IMPLANT
BLADE SURG 15 STRL LF DISP TIS (BLADE) ×1 IMPLANT
BLADE SURG 15 STRL SS (BLADE) ×2
CANISTER SUCT 1200ML W/VALVE (MISCELLANEOUS) IMPLANT
CHLORAPREP W/TINT 26ML (MISCELLANEOUS) IMPLANT
COVER BACK TABLE 60X90IN (DRAPES) ×3 IMPLANT
COVER MAYO STAND STRL (DRAPES) ×3 IMPLANT
DECANTER SPIKE VIAL GLASS SM (MISCELLANEOUS) IMPLANT
DRAPE C-ARM 42X72 X-RAY (DRAPES) ×3 IMPLANT
DRAPE LAPAROSCOPIC ABDOMINAL (DRAPES) ×3 IMPLANT
DRSG TEGADERM 4X4.75 (GAUZE/BANDAGES/DRESSINGS) ×3 IMPLANT
ELECT COATED BLADE 2.86 ST (ELECTRODE) ×3 IMPLANT
ELECT REM PT RETURN 9FT ADLT (ELECTROSURGICAL) ×3
ELECTRODE REM PT RTRN 9FT ADLT (ELECTROSURGICAL) ×1 IMPLANT
GAUZE SPONGE 4X4 12PLY STRL (GAUZE/BANDAGES/DRESSINGS) ×3 IMPLANT
GLOVE BIO SURGEON STRL SZ7 (GLOVE) ×3 IMPLANT
GLOVE BIOGEL PI IND STRL 7.5 (GLOVE) ×1 IMPLANT
GLOVE BIOGEL PI INDICATOR 7.5 (GLOVE) ×2
GOWN STRL REUS W/ TWL LRG LVL3 (GOWN DISPOSABLE) ×4 IMPLANT
GOWN STRL REUS W/TWL LRG LVL3 (GOWN DISPOSABLE) ×8
IV HEPARIN 1000UNITS/500ML (IV SOLUTION) ×3 IMPLANT
IV KIT MINILOC 20X1 SAFETY (NEEDLE) IMPLANT
KIT PORT POWER 8FR ISP CVUE (Catheter) ×3 IMPLANT
LIQUID BAND (GAUZE/BANDAGES/DRESSINGS) ×3 IMPLANT
NDL SAFETY ECLIPSE 18X1.5 (NEEDLE) IMPLANT
NEEDLE HYPO 18GX1.5 SHARP (NEEDLE)
NEEDLE HYPO 25X1 1.5 SAFETY (NEEDLE) ×3 IMPLANT
PACK BASIN DAY SURGERY FS (CUSTOM PROCEDURE TRAY) ×3 IMPLANT
PENCIL BUTTON HOLSTER BLD 10FT (ELECTRODE) ×3 IMPLANT
SLEEVE SCD COMPRESS KNEE MED (MISCELLANEOUS) IMPLANT
SPONGE GAUZE 4X4 12PLY STER LF (GAUZE/BANDAGES/DRESSINGS) ×3 IMPLANT
SUT MON AB 4-0 PC3 18 (SUTURE) ×3 IMPLANT
SUT PROLENE 2 0 SH DA (SUTURE) ×3 IMPLANT
SUT SILK 2 0 TIES 17X18 (SUTURE)
SUT SILK 2-0 18XBRD TIE BLK (SUTURE) IMPLANT
SUT VIC AB 3-0 SH 27 (SUTURE) ×2
SUT VIC AB 3-0 SH 27X BRD (SUTURE) ×1 IMPLANT
SYR 5ML LUER SLIP (SYRINGE) ×3 IMPLANT
SYR CONTROL 10ML LL (SYRINGE) ×3 IMPLANT
TOWEL OR 17X24 6PK STRL BLUE (TOWEL DISPOSABLE) ×3 IMPLANT
TOWEL OR NON WOVEN STRL DISP B (DISPOSABLE) ×3 IMPLANT
TUBE CONNECTING 20'X1/4 (TUBING)
TUBE CONNECTING 20X1/4 (TUBING) IMPLANT
YANKAUER SUCT BULB TIP NO VENT (SUCTIONS) IMPLANT

## 2015-12-27 NOTE — Anesthesia Postprocedure Evaluation (Signed)
Anesthesia Post Note  Patient: Julie [name]  Procedure(s) Performed: Procedure(s) (LRB): PORT A CATH REVISION (N/A)  Patient location during evaluation: PACU Anesthesia Type: General Level of consciousness: awake and alert Pain management: pain level controlled Vital Signs Assessment: post-procedure vital signs reviewed and stable Respiratory status: spontaneous breathing, nonlabored ventilation, respiratory function stable and patient connected to nasal cannula oxygen Cardiovascular status: blood pressure returned to baseline and stable Postop Assessment: no signs of nausea or vomiting Anesthetic complications: no    Last Vitals:  Vitals:   12/27/15 1238 12/27/15 1304  BP:  133/74  Pulse: 72 67  Resp: (!) 23 18  Temp:  36.4 C    Last Pain:  Vitals:   12/27/15 1304  TempSrc:   PainSc: 3                  Tiajuana Amass

## 2015-12-27 NOTE — Anesthesia Procedure Notes (Signed)
Procedure Name: LMA Insertion Performed by: Mirha Brucato W Pre-anesthesia Checklist: Patient identified, Emergency Drugs available, Suction available and Patient being monitored Patient Re-evaluated:Patient Re-evaluated prior to inductionOxygen Delivery Method: Circle system utilized Preoxygenation: Pre-oxygenation with 100% oxygen Intubation Type: IV induction Ventilation: Mask ventilation without difficulty LMA: LMA inserted LMA Size: 4.0 Number of attempts: 2 Placement Confirmation: positive ETCO2 Tube secured with: Tape Dental Injury: Teeth and Oropharynx as per pre-operative assessment        

## 2015-12-27 NOTE — Anesthesia Preprocedure Evaluation (Signed)
Anesthesia Evaluation  Patient identified by MRN, date of birth, ID band Patient awake    Reviewed: Allergy & Precautions, NPO status , Patient's Chart, lab work & pertinent test results  History of Anesthesia Complications (+) PONV  Airway Mallampati: II   Neck ROM: full    Dental   Pulmonary    breath sounds clear to auscultation       Cardiovascular hypertension,  Rhythm:regular Rate:Normal     Neuro/Psych Anxiety Depression  Neuromuscular disease    GI/Hepatic hiatal hernia, GERD  ,  Endo/Other  diabetes, Type 2obese  Renal/GU      Musculoskeletal  (+) Arthritis , Fibromyalgia -  Abdominal   Peds  Hematology   Anesthesia Other Findings   Reproductive/Obstetrics                             Anesthesia Physical  Anesthesia Plan  ASA: III  Anesthesia Plan: General and Regional   Post-op Pain Management:  Regional for Post-op pain   Induction: Intravenous  Airway Management Planned: LMA  Additional Equipment:   Intra-op Plan:   Post-operative Plan:   Informed Consent: I have reviewed the patients History and Physical, chart, labs and discussed the procedure including the risks, benefits and alternatives for the proposed anesthesia with the patient or authorized representative who has indicated his/her understanding and acceptance.     Plan Discussed with: CRNA, Anesthesiologist and Surgeon  Anesthesia Plan Comments:         Anesthesia Quick Evaluation

## 2015-12-27 NOTE — Transfer of Care (Signed)
Immediate Anesthesia Transfer of Care Note  Patient: Julie [name]  Procedure(s) Performed: Procedure(s): PORT A CATH REVISION (N/A)  Patient Location: PACU  Anesthesia Type:General  Level of Consciousness: awake, sedated and patient cooperative  Airway & Oxygen Therapy: Patient Spontanous Breathing and Patient connected to face mask oxygen  Post-op Assessment: Report given to RN and Post -op Vital signs reviewed and stable  Post vital signs: Reviewed and stable  Last Vitals:  Vitals:   12/27/15 1045 12/27/15 1058  BP: 139/74   Pulse: 60 64  Resp: 12 11  Temp:      Last Pain:  Vitals:   12/27/15 1100  TempSrc:   PainSc: 3       Patients Stated Pain Goal: 0 ([id number])  Complications: No apparent anesthesia complications

## 2015-12-27 NOTE — Progress Notes (Signed)
Patient ID: Julie [name], female   DOB: [birth year], 82 y.o.   MRN: [id number] [name] had some ectopy with line placement so I pulled this back to where no more occurred in the cava.  On her post chest xray after sitting up the line has pulled back significantly. It needs to be revised today. I discussed doing this today and will hold her chemo for today.

## 2015-12-27 NOTE — Transfer of Care (Signed)
Immediate Anesthesia Transfer of Care Note  Patient: FANYA MELIN  Procedure(s) Performed: Procedure(s): INSERTION PORT-A-CATH WITH ULTRASOUND  (Right)  Patient Location: PACU  Anesthesia Type:General  Level of Consciousness: awake and sedated  Airway & Oxygen Therapy: Patient Spontanous Breathing  Post-op Assessment: Report given to RN and Post -op Vital signs reviewed and stable  Post vital signs: Reviewed and stable  Last Vitals:  Vitals:   12/27/15 0638 12/27/15 0835  BP: 131/65 (!) 143/64  Pulse: (!) 56 78  Resp: 18   Temp: 36.4 C 36.4 C    Last Pain:  Vitals:   12/27/15 0638  TempSrc: Oral  PainSc:       Patients Stated Pain Goal: 0 ([id number])  Complications: No apparent anesthesia complications

## 2015-12-27 NOTE — Discharge Instructions (Signed)

## 2015-12-27 NOTE — Anesthesia Preprocedure Evaluation (Addendum)
Anesthesia Evaluation  Patient identified by MRN, date of birth, ID band Patient awake    Reviewed: Allergy & Precautions, NPO status , Patient's Chart, lab work & pertinent test results  History of Anesthesia Complications (+) PONV  Airway Mallampati: II   Neck ROM: full    Dental   Pulmonary    breath sounds clear to auscultation       Cardiovascular hypertension,  Rhythm:regular Rate:Normal     Neuro/Psych Anxiety Depression  Neuromuscular disease    GI/Hepatic hiatal hernia, GERD  ,  Endo/Other  diabetes, Type 2obese  Renal/GU      Musculoskeletal  (+) Arthritis , Fibromyalgia -  Abdominal   Peds  Hematology   Anesthesia Other Findings   Reproductive/Obstetrics                             Anesthesia Physical  Anesthesia Plan  ASA: III  Anesthesia Plan: General   Post-op Pain Management:    Induction: Intravenous  Airway Management Planned: LMA  Additional Equipment:   Intra-op Plan:   Post-operative Plan:   Informed Consent: I have reviewed the patients History and Physical, chart, labs and discussed the procedure including the risks, benefits and alternatives for the proposed anesthesia with the patient or authorized representative who has indicated his/her understanding and acceptance.     Plan Discussed with: CRNA, Anesthesiologist and Surgeon  Anesthesia Plan Comments:        Anesthesia Quick Evaluation

## 2015-12-27 NOTE — Op Note (Signed)
Preoperative diagnosis: breast cancer need for venous access, line pulled back on final film from insertion Postoperative diagnosis: same as above Procedure: right ij port revision Surgeon: Dr Serita Grammes EBL: minimal Anes: general  Specimens none Complications none Drains none Sponge count correct Dispo to pacu stable  Indications: This is aa 69 yof due to begin systemic therapy for breast cancer. I placed her port this am and pulled the line back to where there was no ectopy.  In recovery it pulled back to be above the clavicle.  I recommended revising today  Procedure: After informed consent was obtained the patient was taken to the operating room. She was given antibiotics. Sequential compression devices were on her legs. She was then placed under general anesthesia with an LMA. Then she was then prepped and draped in the standard sterile surgical fashion. Surgical timeout was then performed.  I reentered the incisions. I removed the line from the port. I then brought the line out the insertion site. This was still in the ij.  I then placed the wire. A new line was placed over the wire.  I took the wire out.  I then pulled the line back to no ectopy in the distal cava.  I then tunneled this to the port site. I attached to the port and sutured this in place.  This flushed easily.  Final fluoro showed it to be in cava with no kinks. I closed with 3-0 vicryl, 4-0 monocryl and glue. She tolerated well and was transferred to recovery

## 2015-12-27 NOTE — Interval H&P Note (Signed)
History and Physical Interval Note:  12/27/2015 7:17 AM  Julie [name]  has presented today for surgery, with the diagnosis of LEFT BREAST CANCER  The various methods of treatment have been discussed with the patient and family. After consideration of risks, benefits and other options for treatment, the patient has consented to  Procedure(s): INSERTION PORT-A-CATH WITH [place] (N/A) as a surgical intervention .  The patient's history has been reviewed, patient examined, no change in status, stable for surgery.  I have reviewed the patient's chart and labs.  Questions were answered to the patient's satisfaction.     [name]

## 2015-12-27 NOTE — Anesthesia Procedure Notes (Signed)
Procedure Name: LMA Insertion Date/Time: 12/27/2015 11:21 AM Performed by: Lyndee Leo Pre-anesthesia Checklist: Patient identified, Emergency Drugs available, Suction available and Patient being monitored Patient Re-evaluated:Patient Re-evaluated prior to inductionOxygen Delivery Method: Circle system utilized Preoxygenation: Pre-oxygenation with 100% oxygen Intubation Type: IV induction Ventilation: Mask ventilation without difficulty LMA: LMA inserted LMA Size: 4.0 Number of attempts: 1 Airway Equipment and Method: Bite block Placement Confirmation: positive ETCO2 Tube secured with: Tape Dental Injury: Teeth and Oropharynx as per pre-operative assessment

## 2015-12-27 NOTE — Op Note (Addendum)
Preoperative diagnosis: breast cancer need for venous access Postoperative diagnosis: same as above Procedure: right ij US guided powerport insertion Surgeon: Dr Serita Grammes EBL: minimal Anes: general  Specimens none Complications none Drains none Sponge count correct Dispo to pacu stable  Indications: This is aa 67 yof due to begin systemic therapy for breast cancer. We discussed port placement.   Procedure: After informed consent was obtained the patient was taken to the operating room. She was given antibiotics. Sequential compression devices were on her legs. She was then placed under general anesthesia with an LMA. Then she was then prepped and draped in the standard sterile surgical fashion. Surgical timeout was then performed.  I used the ultrasound to identify the right internal jugular vein. I then accessed the vein using the ultrasound. This aspirated blood. I then placed the wire. This first went into the left subclavian. I was able to pull this back and redirect it. This was confirmed by fluoroscopy to be in the correct position. I created a pocket on the right chest. I tunneled the line between the 2 sites. I then dilated the tract and placed the dilator assembly with the sheath. This was done under fluoroscopy. I then removed the sheath and dilator. The wire was also removed. The line was then pulled back to be in the distal cava. I hooked this up to the port. I sutured this into place with 2-0 Prolene in 2 places. This aspirated blood and flushed easily. I accessed the port and placed a dressing.This was confirmed with a final fluoroscopy. I then closed this with 2-0 Vicryl and 4-0 Monocryl. Dermabond was placed on both the incisions.She tolerated this well and was transferred to the recovery room in stable condition.

## 2015-12-27 NOTE — Anesthesia Postprocedure Evaluation (Signed)
Anesthesia Post Note  Patient: AKAIYA HOLLADAY  Procedure(s) Performed: Procedure(s) (LRB): INSERTION PORT-A-CATH WITH ULTRASOUND  (Right)  Patient location during evaluation: PACU Anesthesia Type: General Level of consciousness: awake and alert Pain management: pain level controlled Vital Signs Assessment: post-procedure vital signs reviewed and stable Respiratory status: spontaneous breathing, nonlabored ventilation, respiratory function stable and patient connected to nasal cannula oxygen Cardiovascular status: blood pressure returned to baseline and stable Postop Assessment: no signs of nausea or vomiting Anesthetic complications: no    Last Vitals:  Vitals:   12/27/15 0900 12/27/15 0915  BP: (!) 157/72 (!) 148/76  Pulse: (!) 58 (!) 58  Resp: (!) 8 (!) 9  Temp:      Last Pain:  Vitals:   12/27/15 0915  TempSrc:   PainSc: 5                  Tiajuana Amass

## 2015-12-27 NOTE — H&P (Signed)
Julie LICK is an 82 y.o. female.   Chief Complaint: breast cancer HPI:  46 yof who lives in [place] with her daughter, she is still active and driving. she underwent screening mm that shows density b. she has no mass or discharge. no she has no real personal history. she has family history. her mammogram showed a left breast mass. the right side was normal. there is a 7 mm spiculated mass in the left breast on mm as well as ultrasound. there is no axillary adenopathy. this underwent core biopsy that shows a grade I IDC that is triple negative, Ki is 80%.  she underwent lump/sn which she has done well from. She is getting chemo now but veins are not good. I was asked to place port  Past Medical History:  Diagnosis Date  . Anemia   . Anxiety    takes Klonopin at bedtime  . Arthritis    takes Methotrexate weekly and Prednisone  . Blood transfusion    no abnormal reaction noted  . Cataracts, bilateral    immature  . Complication of anesthesia    wakes up during surgery  . Depression   . Diabetes mellitus without complication ([place])   . Fibromyalgia    takes Lyrica daily  . GERD (gastroesophageal reflux disease)    takes Nexium daily  . Glaucoma   . Hard of hearing   . Heart murmur   . Histoplasmosis   . History of colon polyps   . History of gout   . History of hiatal hernia   . History of shingles   . Hyperlipidemia   . Hypertension    takes Amlodipine,Metoprolol,and Losartan daily  . Incontinence of bowel   . Interstitial cystitis   . Joint pain   . Nocturia   . Peripheral neuropathy ([place])   . PONV (postoperative nausea and vomiting)   . Ulcer 1970   duodenal  . Urinary frequency   . Urinary urgency   . Weakness    numbness in both hands and feet    Past Surgical History:  Procedure Laterality Date  . ABDOMINAL HYSTERECTOMY    . accupuncture  3 yrs ago  . ANKLE FUSION Left 05/10/2014   Procedure: LEFT ANKLE ARTHRODESIS ;  Surgeon: Wylene Simmer, MD;   Location: [facility];  Service: Orthopedics;  Laterality: Left;  . ANKLE SURGERY Left   . arm surgery Left    radius  . bladder tacked     . CERVICAL FUSION    . CHOLECYSTECTOMY    . COLONOSCOPY    . CYSTOCELE REPAIR    . DILATION AND CURETTAGE OF UTERUS    . ESOPHAGOGASTRODUODENOSCOPY    . RADIOACTIVE SEED GUIDED MASTECTOMY WITH AXILLARY SENTINEL LYMPH NODE BIOPSY Left 11/14/2015   Procedure: RADIOACTIVE SEED GUIDED LEFT BREAST LUMPECTOMY WITH AXILLARY SENTINEL LYMPH NODE BIOPSY;  Surgeon: Rolm Bookbinder, MD;  Location: [facility];  Service: General;  Laterality: Left;  RADIOACTIVE SEED GUIDED LEFT BREAST LUMPECTOMY WITH AXILLARY SENTINEL LYMPH NODE BIOPSY  . SALPINGOOPHORECTOMY Bilateral   . TOTAL KNEE ARTHROPLASTY Right 02/28/2015   Procedure: RIGHT TOTAL KNEE ARTHROPLASTY;  Surgeon: Rod Can, MD;  Location: [facility];  Service: Orthopedics;  Laterality: Right;  . UPPER GASTROINTESTINAL ENDOSCOPY    . VAGINAL HYSTERECTOMY      Family History  Problem Relation Age of Onset  . Colon cancer Sister 66  . Esophageal cancer Brother 43  . Breast cancer Mother    Social History:  reports that she has never smoked. She has never used smokeless tobacco. She reports that she does not drink alcohol or use drugs.  Allergies:  Allergies  Allergen Reactions  . Other     Walnuts-anaphylaxis  . Aspirin Other (See Comments)    Duodenal ulcer- cannot take aspirin when active... Does well with 81 mg. HYPERSENSITIVE  TO  REGULAR DOSE.      Medications Prior to Admission  Medication Sig Dispense Refill  . amLODipine (NORVASC) 10 MG tablet Take 10 mg by mouth every morning.     . calcium-vitamin D (OSCAL WITH D) 500-200 MG-UNIT per tablet Take 1 tablet by mouth every morning.    [name] esomeprazole (NEXIUM) 40 MG capsule Take 40 mg by mouth daily before breakfast.      . ezetimibe (ZETIA) 10 MG tablet Take 10 mg by mouth every other day. Trial, taking every other day    . fish  oil-omega-3 fatty acids 1000 MG capsule Take 1 g by mouth every morning.     . folic acid (FOLVITE) 1 MG tablet Take 2 mg by mouth every morning.     . meloxicam (MOBIC) 15 MG tablet Take 15 mg by mouth daily.  11  . metFORMIN (GLUCOPHAGE) 500 MG tablet Take by mouth 2 (two) times daily with a meal.    . methotrexate (50 MG/ML) 1 g injection Inject into the skin once a week.    . metoprolol succinate (TOPROL-XL) 50 MG 24 hr tablet TAKE 1 TABLET AT BEDTIME ONCE A DAY ORALLY 90 DAYS  2  . predniSONE (DELTASONE) 5 MG tablet Take 5 mg by mouth every other day.     . pregabalin (LYRICA) 75 MG capsule Take 75 mg by mouth 2 (two) times daily.    . valsartan-hydrochlorothiazide (DIOVAN-HCT) 320-12.5 MG per tablet Take 1 tablet by mouth daily.    . vitamin E 400 UNIT capsule Take 400 Units by mouth daily.    [name] acetaminophen (TYLENOL) 500 MG tablet Take 500 mg by mouth every 6 (six) hours as needed.    . B-D TB SYRINGE 1CC/25GX5/8" 25G X 5/8" 1 ML MISC USE WITH METHOTREXATE  0  . clonazePAM (KLONOPIN) 0.5 MG tablet Take 0.5 mg by mouth at bedtime.    . lidocaine-prilocaine (EMLA) cream Apply to affected area once 30 g 3  . ondansetron (ZOFRAN) 8 MG tablet Take 1 tablet (8 mg total) by mouth 2 (two) times daily as needed (Nausea or vomiting). 30 tablet 1  . prochlorperazine (COMPAZINE) 10 MG tablet Take 1 tablet (10 mg total) by mouth every 6 (six) hours as needed (Nausea or vomiting). 30 tablet 1  . traMADol (ULTRAM) 50 MG tablet Take by mouth every 6 (six) hours as needed. Reported on 11/25/2015      Results for orders placed or performed during the hospital encounter of 12/27/15 (from the past 48 hour(s))  Glucose, capillary     Status: None   Collection Time: 12/27/15  6:58 AM  Result Value Ref Range   Glucose-Capillary 90 65 - 99 mg/dL   No results found.  ROS  HEENT Not Present- Earache, Hearing Loss, Hoarseness, Nose Bleed, Oral Ulcers, Ringing in the Ears, Seasonal Allergies, Sinus Pain, Sore  Throat, Visual Disturbances, Wears glasses/contact lenses and Yellow Eyes. Gastrointestinal Present- Change in Bowel Habits and Excessive gas. Not Present- Abdominal Pain, Bloating, Bloody Stool, Chronic diarrhea, Constipation, Difficulty Swallowing, Gets full quickly at meals, Hemorrhoids, Indigestion, Nausea, Rectal Pain and Vomiting. Female Genitourinary  Not Present- Frequency, Nocturia, Painful Urination, Pelvic Pain and Urgency. Psychiatric Present- Anxiety. Not Present- Bipolar, Change in Sleep Pattern, Depression, Fearful and Frequent crying. Hematology Not Present- Easy Bruising, Excessive bleeding, Gland problems, HIV and Persistent Infections.  Blood pressure 131/65, pulse (!) 56, temperature 97.6 F (36.4 C), temperature source Oral, resp. rate 18, height 5\' 3"  (1.6 m), weight 98.4 kg (217 lb), SpO2 99 %. Physical Exam   Vitals  Weight: 213 lb Height: 65in Body Surface Area: 2.03 m Body Mass Index: 35.44 kg/m  Temp.: 97.14F(Temporal)  Pulse: 70 (Regular)  BP: 132/78 (Sitting, Left Arm, Standard) Physical Exam  General Mental Status-Alert. Orientation-Oriented X3. Chest and Lung Exam Chest and lung exam reveals -on auscultation, normal breath sounds, no adventitious sounds and normal vocal resonance. Breast Nipples-No Discharge. Breast Lump-No Palpable Breast Mass. Healed incisions Cardiovascular Cardiovascular examination reveals -normal heart sounds, regular rate and rhythm with no murmurs. Lymphatic Head & Neck General Head & Neck Lymphatics: Bilateral - Description - Normal. Axillary General Axillary Region: Bilateral - Description - Normal. Note: no [place] adenopathy  Assessment/Plan BREAST CANCER OF UPPER-OUTER QUADRANT OF LEFT FEMALE BREAST (C50.412) Port placement  [facility], MD 12/27/2015, 7:01 AM

## 2015-12-27 NOTE — Telephone Encounter (Signed)
Received call from Dr. [name] stating pt will not be done with port in time for chemo at 11. Called infusion room to cancel her chemo appt. Called pt daughter to relate we will cancel her chemo for this week (12/27/15) and proceed with chemo on 01/03/16. Discussed EMLA cream and application for [id number] treatment. Gave instructions. Received verbal understanding. Denies further needs at this time.

## 2015-12-30 ENCOUNTER — Encounter (HOSPITAL_BASED_OUTPATIENT_CLINIC_OR_DEPARTMENT_OTHER): Payer: Self-pay | Admitting: General Surgery

## 2015-12-31 NOTE — Addendum Note (Signed)
Addendum  created 12/31/15 1759 by Suzette Battiest, MD   Sign clinical note

## 2016-01-02 ENCOUNTER — Telehealth: Payer: Self-pay | Admitting: *Deleted

## 2016-01-02 NOTE — Telephone Encounter (Signed)
TC from patient to confirm appts for tomorrow. Provided information to patient . Also reviewed how to use EMLA cream on her port prior to infusion appt.  Pt. Voiced understanding.

## 2016-01-03 ENCOUNTER — Encounter: Payer: Self-pay | Admitting: *Deleted

## 2016-01-03 ENCOUNTER — Other Ambulatory Visit (HOSPITAL_BASED_OUTPATIENT_CLINIC_OR_DEPARTMENT_OTHER): Payer: Medicare Other

## 2016-01-03 ENCOUNTER — Ambulatory Visit (HOSPITAL_BASED_OUTPATIENT_CLINIC_OR_DEPARTMENT_OTHER): Payer: Medicare Other

## 2016-01-03 ENCOUNTER — Telehealth: Payer: Self-pay | Admitting: Hematology

## 2016-01-03 ENCOUNTER — Ambulatory Visit (HOSPITAL_BASED_OUTPATIENT_CLINIC_OR_DEPARTMENT_OTHER): Payer: Self-pay | Admitting: Nurse Practitioner

## 2016-01-03 ENCOUNTER — Ambulatory Visit (HOSPITAL_BASED_OUTPATIENT_CLINIC_OR_DEPARTMENT_OTHER): Payer: Medicare Other | Admitting: Hematology

## 2016-01-03 ENCOUNTER — Encounter: Payer: Self-pay | Admitting: Hematology

## 2016-01-03 VITALS — BP 156/71 | HR 68 | Temp 98.2°F | Resp 18 | Ht 63.0 in | Wt 213.5 lb

## 2016-01-03 VITALS — BP 151/74 | HR 64 | Temp 97.8°F | Resp 18

## 2016-01-03 DIAGNOSIS — Z95828 Presence of other vascular implants and grafts: Secondary | ICD-10-CM | POA: Insufficient documentation

## 2016-01-03 DIAGNOSIS — C50412 Malignant neoplasm of upper-outer quadrant of left female breast: Secondary | ICD-10-CM | POA: Diagnosis not present

## 2016-01-03 DIAGNOSIS — D472 Monoclonal gammopathy: Secondary | ICD-10-CM

## 2016-01-03 DIAGNOSIS — Z171 Estrogen receptor negative status [ER-]: Secondary | ICD-10-CM

## 2016-01-03 DIAGNOSIS — T7840XA Allergy, unspecified, initial encounter: Secondary | ICD-10-CM

## 2016-01-03 DIAGNOSIS — D649 Anemia, unspecified: Secondary | ICD-10-CM

## 2016-01-03 DIAGNOSIS — Z452 Encounter for adjustment and management of vascular access device: Secondary | ICD-10-CM | POA: Diagnosis present

## 2016-01-03 DIAGNOSIS — Z5111 Encounter for antineoplastic chemotherapy: Secondary | ICD-10-CM

## 2016-01-03 LAB — COMPREHENSIVE METABOLIC PANEL
ALT: 11 U/L (ref 0–55)
ANION GAP: 9 meq/L (ref 3–11)
AST: 16 U/L (ref 5–34)
Albumin: 3.4 g/dL — ABNORMAL LOW (ref 3.5–5.0)
Alkaline Phosphatase: 70 U/L (ref 40–150)
BUN: 19.4 mg/dL (ref 7.0–26.0)
CHLORIDE: 107 meq/L (ref 98–109)
CO2: 27 meq/L (ref 22–29)
CREATININE: 1 mg/dL (ref 0.6–1.1)
Calcium: 9.4 mg/dL (ref 8.4–10.4)
EGFR: 62 mL/min/{1.73_m2} — ABNORMAL LOW (ref >90)
Glucose: 80 mg/dl (ref 70–140)
Potassium: 3.7 mEq/L (ref 3.5–5.1)
SODIUM: 143 meq/L (ref 136–145)
Total Bilirubin: 0.65 mg/dL (ref 0.20–1.20)
Total Protein: 6.9 g/dL (ref 6.4–8.3)

## 2016-01-03 LAB — CBC WITH DIFFERENTIAL/PLATELET
BASO%: 1.3 % (ref 0.0–2.0)
Basophils Absolute: 0.1 10*3/uL (ref 0.0–0.1)
EOS%: 3.5 % (ref 0.0–7.0)
Eosinophils Absolute: 0.2 10*3/uL (ref 0.0–0.5)
HCT: 33.8 % — ABNORMAL LOW (ref 34.8–46.6)
HGB: 10.7 g/dL — ABNORMAL LOW (ref 11.6–15.9)
LYMPH%: 14.8 % (ref 14.0–49.7)
MCH: 26.9 pg (ref 25.1–34.0)
MCHC: 31.8 g/dL (ref 31.5–36.0)
MCV: 84.6 fL (ref 79.5–101.0)
MONO#: 0.9 10*3/uL (ref 0.1–0.9)
MONO%: 13.6 % (ref 0.0–14.0)
NEUT#: 4.3 10*3/uL (ref 1.5–6.5)
NEUT%: 66.8 % (ref 38.4–76.8)
PLATELETS: 246 10*3/uL (ref 145–400)
RBC: 3.99 10*6/uL (ref 3.70–5.45)
RDW: 14.8 % — ABNORMAL HIGH (ref 11.2–14.5)
WBC: 6.5 10*3/uL (ref 3.9–10.3)
lymph#: 1 10*3/uL (ref 0.9–3.3)

## 2016-01-03 MED ORDER — SODIUM CHLORIDE 0.9 % IJ SOLN
10.0000 mL | INTRAMUSCULAR | Status: DC | PRN
  Administered 2016-01-03: 10 mL via INTRAVENOUS
  Filled 2016-01-03: qty 10

## 2016-01-03 MED ORDER — DIPHENHYDRAMINE HCL 50 MG/ML IJ SOLN
25.0000 mg | Freq: Once | INTRAMUSCULAR | Status: AC
  Administered 2016-01-03: 25 mg via INTRAVENOUS

## 2016-01-03 MED ORDER — SODIUM CHLORIDE 0.9 % IV SOLN
80.0000 mg/m2 | Freq: Once | INTRAVENOUS | Status: AC
  Administered 2016-01-03: 168 mg via INTRAVENOUS
  Filled 2016-01-03: qty 28

## 2016-01-03 MED ORDER — DIPHENHYDRAMINE HCL 50 MG/ML IJ SOLN
25.0000 mg | Freq: Once | INTRAMUSCULAR | Status: AC | PRN
  Administered 2016-01-03: 25 mg via INTRAVENOUS

## 2016-01-03 MED ORDER — DEXAMETHASONE SODIUM PHOSPHATE 100 MG/10ML IJ SOLN
10.0000 mg | Freq: Once | INTRAMUSCULAR | Status: AC
  Administered 2016-01-03: 10 mg via INTRAVENOUS
  Filled 2016-01-03: qty 1

## 2016-01-03 MED ORDER — HEPARIN SOD (PORK) LOCK FLUSH 100 UNIT/ML IV SOLN
500.0000 [IU] | Freq: Once | INTRAVENOUS | Status: DC | PRN
  Filled 2016-01-03: qty 5

## 2016-01-03 MED ORDER — SODIUM CHLORIDE 0.9 % IV SOLN
Freq: Once | INTRAVENOUS | Status: AC
  Administered 2016-01-03: 13:00:00 via INTRAVENOUS

## 2016-01-03 MED ORDER — FAMOTIDINE IN NACL 20-0.9 MG/50ML-% IV SOLN
20.0000 mg | Freq: Once | INTRAVENOUS | Status: AC
  Administered 2016-01-03: 20 mg via INTRAVENOUS

## 2016-01-03 MED ORDER — SODIUM CHLORIDE 0.9% FLUSH
10.0000 mL | INTRAVENOUS | Status: DC | PRN
Start: 2016-01-03 — End: 2016-01-03
  Filled 2016-01-03: qty 10

## 2016-01-03 MED ORDER — METHYLPREDNISOLONE SODIUM SUCC 125 MG IJ SOLR
125.0000 mg | Freq: Once | INTRAMUSCULAR | Status: AC | PRN
  Administered 2016-01-03: 125 mg via INTRAVENOUS

## 2016-01-03 NOTE — Progress Notes (Signed)
Pt tolerated taxol rechallenge. No acute distress upon discharge. AVS printed. Pt educated on possible delayed reaction symptoms that she may experience at home. Pt verbalized understanding and will call or visit closest ED with further concern.

## 2016-01-03 NOTE — Patient Instructions (Signed)
[facility] Discharge Instructions for Patients Receiving Chemotherapy  Today you received the following chemotherapy agent: Taxol.  To help prevent nausea and vomiting after your treatment, we encourage you to take your nausea medication as prescribed.  If you develop nausea and vomiting that is not controlled by your nausea medication, call the clinic.   BELOW ARE SYMPTOMS THAT SHOULD BE REPORTED IMMEDIATELY:  *FEVER GREATER THAN 100.5 F  *CHILLS WITH OR WITHOUT FEVER  NAUSEA AND VOMITING THAT IS NOT CONTROLLED WITH YOUR NAUSEA MEDICATION  *UNUSUAL SHORTNESS OF BREATH  *UNUSUAL BRUISING OR BLEEDING  TENDERNESS IN MOUTH AND THROAT WITH OR WITHOUT PRESENCE OF ULCERS  *URINARY PROBLEMS  *BOWEL PROBLEMS  UNUSUAL RASH Items with * indicate a potential emergency and should be followed up as soon as possible.  Feel free to call the clinic you have any questions or concerns. The clinic phone number is [phone].  Please show the [facility] at check-in to the Emergency Department and triage nurse.  Paclitaxel injection What is this medicine? PACLITAXEL (PAK li TAX el) is a chemotherapy drug. It targets fast dividing cells, like cancer cells, and causes these cells to die. This medicine is used to treat ovarian cancer, breast cancer, and other cancers. This medicine may be used for other purposes; ask your health care provider or pharmacist if you have questions. What should I tell my health care provider before I take this medicine? They need to know if you have any of these conditions: -blood disorders -irregular heartbeat -infection (especially a virus infection such as chickenpox, cold sores, or herpes) -liver disease -previous or ongoing radiation therapy -an unusual or allergic reaction to paclitaxel, alcohol, polyoxyethylated castor oil, other chemotherapy agents, other medicines, foods, dyes, or preservatives -pregnant or trying to get  pregnant -breast-feeding How should I use this medicine? This drug is given as an infusion into a vein. It is administered in a hospital or clinic by a specially trained health care professional. Talk to your pediatrician regarding the use of this medicine in children. Special care may be needed. Overdosage: If you think you have taken too much of this medicine contact a poison control center or emergency room at once. NOTE: This medicine is only for you. Do not share this medicine with others. What if I miss a dose? It is important not to miss your dose. Call your doctor or health care professional if you are unable to keep an appointment. What may interact with this medicine? Do not take this medicine with any of the following medications: -disulfiram -metronidazole This medicine may also interact with the following medications: -cyclosporine -diazepam -ketoconazole -medicines to increase blood counts like filgrastim, pegfilgrastim, sargramostim -other chemotherapy drugs like cisplatin, doxorubicin, epirubicin, etoposide, teniposide, vincristine -quinidine -testosterone -vaccines -verapamil Talk to your doctor or health care professional before taking any of these medicines: -acetaminophen -aspirin -ibuprofen -ketoprofen -naproxen This list may not describe all possible interactions. Give your health care provider a list of all the medicines, herbs, non-prescription drugs, or dietary supplements you use. Also tell them if you smoke, drink alcohol, or use illegal drugs. Some items may interact with your medicine. What should I watch for while using this medicine? Your condition will be monitored carefully while you are receiving this medicine. You will need important blood work done while you are taking this medicine. This drug may make you feel generally unwell. This is not uncommon, as chemotherapy can affect healthy cells as well as cancer cells.  Report any side effects. Continue  your course of treatment even though you feel ill unless your doctor tells you to stop. This medicine can cause serious allergic reactions. To reduce your risk you will need to take other medicine(s) before treatment with this medicine. In some cases, you may be given additional medicines to help with side effects. Follow all directions for their use. Call your doctor or health care professional for advice if you get a fever, chills or sore throat, or other symptoms of a cold or flu. Do not treat yourself. This drug decreases your body's ability to fight infections. Try to avoid being around people who are sick. This medicine may increase your risk to bruise or bleed. Call your doctor or health care professional if you notice any unusual bleeding. Be careful brushing and flossing your teeth or using a toothpick because you may get an infection or bleed more easily. If you have any dental work done, tell your dentist you are receiving this medicine. Avoid taking products that contain aspirin, acetaminophen, ibuprofen, naproxen, or ketoprofen unless instructed by your doctor. These medicines may hide a fever. Do not become pregnant while taking this medicine. Women should inform their doctor if they wish to become pregnant or think they might be pregnant. There is a potential for serious side effects to an unborn child. Talk to your health care professional or pharmacist for more information. Do not breast-feed an infant while taking this medicine. Men are advised not to father a child while receiving this medicine. This product may contain alcohol. Ask your pharmacist or healthcare provider if this medicine contains alcohol. Be sure to tell all healthcare providers you are taking this medicine. Certain medicines, like metronidazole and disulfiram, can cause an unpleasant reaction when taken with alcohol. The reaction includes flushing, headache, nausea, vomiting, sweating, and increased thirst. The reaction  can last from 30 minutes to several hours. What side effects may I notice from receiving this medicine? Side effects that you should report to your doctor or health care professional as soon as possible: -allergic reactions like skin rash, itching or hives, swelling of the face, lips, or tongue -low blood counts - This drug may decrease the number of white blood cells, red blood cells and platelets. You may be at increased risk for infections and bleeding. -signs of infection - fever or chills, cough, sore throat, pain or difficulty passing urine -signs of decreased platelets or bleeding - bruising, pinpoint red spots on the skin, black, tarry stools, nosebleeds -signs of decreased red blood cells - unusually weak or tired, fainting spells, lightheadedness -breathing problems -chest pain -high or low blood pressure -mouth sores -nausea and vomiting -pain, swelling, redness or irritation at the injection site -pain, tingling, numbness in the hands or feet -slow or irregular heartbeat -swelling of the ankle, feet, hands Side effects that usually do not require medical attention (report to your doctor or health care professional if they continue or are bothersome): -bone pain -complete hair loss including hair on your head, underarms, pubic hair, eyebrows, and eyelashes -changes in the color of fingernails -diarrhea -loosening of the fingernails -loss of appetite -muscle or joint pain -red flush to skin -sweating This list may not describe all possible side effects. Call your doctor for medical advice about side effects. You may report side effects to FDA at 1-800-FDA-1088. Where should I keep my medicine? This drug is given in a hospital or clinic and will not be stored at home. NOTE: This  sheet is a summary. It may not cover all possible information. If you have questions about this medicine, talk to your doctor, pharmacist, or health care provider.    2016, [employer].  (2014-12-27 13:02:56)

## 2016-01-03 NOTE — Progress Notes (Signed)
1345- Pt started complain of sudden cp and sob 10 min during infusion of Taxol. Stopped chemo infusion. And flushed pt with NSS wide open. Pt given 125mg  iv solumedrol, and 25mg  IV benadryl. Called symptom management C.Berniece Salines NP, and was at bedside. Dr. Burr Medico came to assess pt at bedside as well. Pt chest pain improved after solumedrol and benadryl given. Dr. Burr Medico wants to rechallenge infusion and monitor pt closely for worsening of chest pain or reactions. Pt agreeable to plan.   1400- Pt restarted on Taxol and will monitor for any further symptoms. Pt feels ok at this time and describes her CP as gnawing 4/10 pain.  VSS

## 2016-01-03 NOTE — Progress Notes (Signed)
[place]  [phone] [phone]  Clinic follow up Note   Patient Care Team: [name], MD as PCP - General (Internal Medicine) Valinda Party, MD as Referring Physician (Rheumatology) 01/03/2016  CHIEF COMPLAINTS:  Follow up left breast cancer   Oncology History   Malignant neoplasm of upper outer quadrant of female breast [facility])   Staging form: Breast, AJCC 7th Edition     Pathologic stage from 11/14/2015: Stage IA (T1c, N0, cM0) - Signed by Truitt Merle, MD on 11/25/2015     Clinical stage from 11/20/2015: Stage IA (T1c, N0, M0) - Signed by Curt Bears, MD on 11/20/2015       Malignant neoplasm of upper outer quadrant of female breast ([place])   10/09/2015 Mammogram    Screening bilateral mammograms showed a possible left breast mass. Diagnostic mammogram and ultrasound showed a 7 x 7 x 7 mm mass in the left breast at 11:00 position.     10/22/2015 Receptors her2    ER negative, PR negative, HER-2 not amplified     10/22/2015 Initial Biopsy    Left breast needle core biopsy at the 1:00 position showed invasive ductal carcinoma.     10/22/2015 Initial Diagnosis    Malignant neoplasm of upper outer quadrant of female breast ([place])     11/14/2015 Surgery    Left breast lumpectomy and sentinel lymph node biopsy     11/14/2015 Pathology Results    Left breast lumpectomy showed invasive ductal carcinoma, grade 3, 1.1 cm, resection margins were negative, sentinel lymph node biopsy showed benign breast parenchyma, lymph node tissue not identified. Repeated ER PR and HER-2 were all negative.      HISTORY OF PRESENTING ILLNESS:  Julie [name] 82 y.o. female is here because of her recently diagnosed left breast cancer. She has been seen my partner Dr. Julien Nordmann for MGUS, and was referred to me to discuss her adjuvant therapy for breast cancer. She is accompanied by her daughter to the clinic today.  Her breast cancer was discovered by screening mammogram.  She has no palpable breast mass, skin change or nipple discharge. She denies any new constitutional symptoms. The diagnostic mammogram and ultrasound showed a 7 mm mass in the left breast, biopsy showed invasive ductal carcinoma, triple negative. She was seen by breast surgeon Dr. [name], and underwent left rest lumpectomy and sentinel lymph node biopsy on 11/14/2015.   She had a multiple orthopedic surgeries in the past, takes tramadol as needed. She lives with her daughter, pretty independent, does not exercise regularly, but it goes out for shopping, playing cards, etc.  GYN HISTORY  Menarchal: 16 LMP: 37 (hysrectomy)  Contraceptive: 2 HRT: 1 yr  G5P5: she did breast feeding to 2 children   CURRENT THERAPY: weekly Taxol '80mg'$ /m2, started on 12/20/2015  INTERIM HISTORY: Julie [name] returns for follow-up and second cycle chemotherapy. She is accompanied by 2 of her daughters to the clinic today. She tolerated the first infusion well 2 weeks ago. Due to her port issue (needed to be revised) her chemotherapy was held last week. She noticed some muscular joint pain in her right big toe, her hands and buttocks lately, intermittent, she has not needed taking any pain medication. She was seen by her primary care physician Dr. Maudie Mercury, and was treated for UTI with Cipro last week. She denies any fever or chills, no other new complaints.  MEDICAL HISTORY:  Past Medical History:  Diagnosis Date  . Anemia   .  Anxiety    takes Klonopin at bedtime  . Arthritis    takes Methotrexate weekly and Prednisone  . Blood transfusion    no abnormal reaction noted  . Cataracts, bilateral    immature  . Complication of anesthesia    wakes up during surgery  . Depression   . Diabetes mellitus without complication ([place])   . Fibromyalgia    takes Lyrica daily  . GERD (gastroesophageal reflux disease)    takes Nexium daily  . Glaucoma   . Hard of hearing   . Heart murmur   . Histoplasmosis   . History of colon  polyps   . History of gout   . History of hiatal hernia   . History of shingles   . Hyperlipidemia   . Hypertension    takes Amlodipine,Metoprolol,and Losartan daily  . Incontinence of bowel   . Interstitial cystitis   . Joint pain   . Nocturia   . Peripheral neuropathy ([place])   . PONV (postoperative nausea and vomiting)   . Ulcer 1970   duodenal  . Urinary frequency   . Urinary urgency   . Weakness    numbness in both hands and feet    SURGICAL HISTORY: Past Surgical History:  Procedure Laterality Date  . ABDOMINAL HYSTERECTOMY    . accupuncture  3 yrs ago  . ANKLE FUSION Left 05/10/2014   Procedure: LEFT ANKLE ARTHRODESIS ;  Surgeon: Wylene Simmer, MD;  Location: [place];  Service: Orthopedics;  Laterality: Left;  . ANKLE SURGERY Left   . arm surgery Left    radius  . bladder tacked     . CERVICAL FUSION    . CHOLECYSTECTOMY    . COLONOSCOPY    . CYSTOCELE REPAIR    . DILATION AND CURETTAGE OF UTERUS    . ESOPHAGOGASTRODUODENOSCOPY    . PORT A CATH REVISION N/A 12/27/2015   Procedure: PORT A CATH REVISION;  Surgeon: Rolm Bookbinder, MD;  Location: [place];  Service: General;  Laterality: N/A;  . PORTACATH PLACEMENT Right 12/27/2015   Procedure: INSERTION PORT-A-CATH WITH ULTRASOUND ;  Surgeon: Rolm Bookbinder, MD;  Location: [place];  Service: General;  Laterality: Right;  . RADIOACTIVE SEED GUIDED MASTECTOMY WITH AXILLARY SENTINEL LYMPH NODE BIOPSY Left 11/14/2015   Procedure: RADIOACTIVE SEED GUIDED LEFT BREAST LUMPECTOMY WITH AXILLARY SENTINEL LYMPH NODE BIOPSY;  Surgeon: Rolm Bookbinder, MD;  Location: [place];  Service: General;  Laterality: Left;  RADIOACTIVE SEED GUIDED LEFT BREAST LUMPECTOMY WITH AXILLARY SENTINEL LYMPH NODE BIOPSY  . SALPINGOOPHORECTOMY Bilateral   . TOTAL KNEE ARTHROPLASTY Right 02/28/2015   Procedure: RIGHT TOTAL KNEE ARTHROPLASTY;  Surgeon: Rod Can, MD;  Location: [facility];  Service:  Orthopedics;  Laterality: Right;  . UPPER GASTROINTESTINAL ENDOSCOPY    . VAGINAL HYSTERECTOMY      SOCIAL HISTORY: Social History   Social History  . Marital status: Divorced    Spouse name: N/A  . Number of children: N/A  . Years of education: N/A   Occupational History  . Not on file.   Social History Main Topics  . Smoking status: Never Smoker  . Smokeless tobacco: Never Used  . Alcohol use No     Comment: IN [place] ONLY  . Drug use: No  . Sexual activity: Not on file   Other Topics Concern  . Not on file   Social History Narrative  . No narrative on file    FAMILY HISTORY: Family  History  Problem Relation Age of Onset  . Colon cancer Sister 58  . Esophageal cancer Brother 54  . Breast cancer Mother     ALLERGIES:  is allergic to other and aspirin.  MEDICATIONS:  Current Outpatient Prescriptions  Medication Sig Dispense Refill  . acetaminophen (TYLENOL) 500 MG tablet Take 500 mg by mouth every 6 (six) hours as needed.    [name] amLODipine (NORVASC) 10 MG tablet Take 10 mg by mouth every morning.     . B-D TB SYRINGE 1CC/25GX5/8" 25G X 5/8" 1 ML MISC USE WITH METHOTREXATE  0  . calcium-vitamin D (OSCAL WITH D) 500-200 MG-UNIT per tablet Take 1 tablet by mouth every morning.    . clonazePAM (KLONOPIN) 0.5 MG tablet Take 0.5 mg by mouth at bedtime.    [name] esomeprazole (NEXIUM) 40 MG capsule Take 40 mg by mouth daily before breakfast.      . ezetimibe (ZETIA) 10 MG tablet Take 10 mg by mouth every other day. Trial, taking every other day    . fish oil-omega-3 fatty acids 1000 MG capsule Take 1 g by mouth every morning.     . folic acid (FOLVITE) 1 MG tablet Take 2 mg by mouth every morning.     . lidocaine-prilocaine (EMLA) cream Apply to affected area once 30 g 3  . meloxicam (MOBIC) 15 MG tablet Take 15 mg by mouth daily.  11  . metFORMIN (GLUCOPHAGE) 500 MG tablet Take by mouth 2 (two) times daily with a meal.    . metoprolol succinate (TOPROL-XL) 50 MG 24 hr  tablet TAKE 1 TABLET AT BEDTIME ONCE A DAY ORALLY 90 DAYS  2  . ondansetron (ZOFRAN) 8 MG tablet Take 1 tablet (8 mg total) by mouth 2 (two) times daily as needed (Nausea or vomiting). 30 tablet 1  . predniSONE (DELTASONE) 5 MG tablet Take 5 mg by mouth every other day.     . pregabalin (LYRICA) 75 MG capsule Take 75 mg by mouth 2 (two) times daily.    . prochlorperazine (COMPAZINE) 10 MG tablet Take 1 tablet (10 mg total) by mouth every 6 (six) hours as needed (Nausea or vomiting). 30 tablet 1  . traMADol (ULTRAM) 50 MG tablet Take by mouth every 6 (six) hours as needed. Reported on 11/25/2015    . valsartan-hydrochlorothiazide (DIOVAN-HCT) 320-12.5 MG per tablet Take 1 tablet by mouth daily.    . vitamin E 400 UNIT capsule Take 400 Units by mouth daily.    . methotrexate (50 MG/ML) 1 g injection Inject into the skin once a week.     No current facility-administered medications for this visit.    Facility-Administered Medications Ordered in Other Visits  Medication Dose Route Frequency Provider Last Rate Last Dose  . heparin lock flush 100 unit/mL  500 Units Intracatheter Once PRN Truitt Merle, MD      . sodium chloride flush (NS) 0.9 % injection 10 mL  10 mL Intracatheter PRN Truitt Merle, MD        REVIEW OF SYSTEMS:   Constitutional: Denies fevers, chills or abnormal night sweats Eyes: Denies blurriness of vision, double vision or watery eyes Ears, nose, mouth, throat, and face: Denies mucositis or sore throat Respiratory: Denies cough, dyspnea or wheezes Cardiovascular: Denies palpitation, chest discomfort or lower extremity swelling Gastrointestinal:  Denies nausea, heartburn or change in bowel habits Skin: Denies abnormal skin rashes Lymphatics: Denies new lymphadenopathy or easy bruising Neurological:Denies numbness, tingling or new weaknesses Behavioral/Psych: Mood is stable,  no new changes  All other systems were reviewed with the patient and are negative.  PHYSICAL EXAMINATION: ECOG  PERFORMANCE STATUS: 1 - Symptomatic but completely ambulatory  Vitals:   01/03/16 1156  BP: (!) 156/71  Pulse: 68  Resp: 18  Temp: 98.2 F (36.8 C)   Filed Weights   01/03/16 1156  Weight: 213 lb 8 oz (96.8 kg)    GENERAL:alert, no distress and comfortable SKIN: skin color, texture, turgor are normal, no rashes or significant lesions EYES: normal, conjunctiva are pink and non-injected, sclera clear OROPHARYNX:no exudate, no erythema and lips, buccal mucosa, and tongue normal  NECK: supple, thyroid normal size, non-tender, without nodularity LYMPH:  no palpable lymphadenopathy in the cervical, axillary or inguinal LUNGS: clear to auscultation and percussion with normal breathing effort HEART: regular rate & rhythm and no murmurs and no lower extremity edema ABDOMEN:abdomen soft, non-tender and normal bowel sounds Musculoskeletal:no cyanosis of digits and no clubbing  PSYCH: alert & oriented x 3 with fluent speech NEURO: no focal motor/sensory deficits Breasts: Breast inspection showed them to be symmetrical with no nipple discharge. Surgical incisions in the left breast and axilla have healed well. No skin erythema or discharge. Mild bruise at her port site. Palpation of the breasts and axilla revealed no obvious mass that I could appreciate.   LABORATORY DATA:  I have reviewed the data as listed CBC Latest Ref Rng & Units 01/03/2016 12/20/2015 11/11/2015  WBC 3.9 - 10.3 10e3/uL 6.5 6.4 5.0  Hemoglobin 11.6 - 15.9 g/dL 10.7(L) 10.6(L) 10.8(L)  Hematocrit 34.8 - 46.6 % 33.8(L) 32.9(L) 33.6(L)  Platelets 145 - 400 10e3/uL 246 217 213   CMP Latest Ref Rng & Units 01/03/2016 12/20/2015 12/20/2015  Glucose 70 - 140 mg/dl 80 68(L) -  BUN 7.0 - 26.0 mg/dL 19.4 21.4 -  Creatinine 0.6 - 1.1 mg/dL 1.0 1.1 -  Sodium 136 - 145 mEq/L 143 145 -  Potassium 3.5 - 5.1 mEq/L 3.7 3.7 -  Chloride 101 - 111 mmol/L - - -  CO2 22 - 29 mEq/L 27 25 -  Calcium 8.4 - 10.4 mg/dL 9.4 9.3 -  Total Protein  6.4 - 8.3 g/dL 6.9 7.0 6.6  Total Bilirubin 0.20 - 1.20 mg/dL 0.65 0.50 -  Alkaline Phos 40 - 150 U/L 70 79 -  AST 5 - 34 U/L 16 17 -  ALT 0 - 55 U/L 11 11 -    PATHOLOGY REPORT  Diagnosis 11/14/2015 1. Breast, lumpectomy, Left - INVASIVE DUCTAL CARCINOMA, GRADE III/III, SPANNING 1.1 CM. - THE RESECTION MARGINS ARE NEGATIVE FOR CARCINOMA. - SEE ONCOLOGY TABLE BELOW. 2. Lymph node, sentinel, biopsy, Left Axillary - BENIGN BREAST PARENCHYMA. - LYMPH NODAL TISSUE IS NOT IDENTIFIED. - THERE IS NO EVIDENCE OF MALIGNANCY. Microscopic Comment 1. BREAST, INVASIVE TUMOR, WITH LYMPH NODES PRESENT Specimen, including laterality and lymph node sampling (sentinel, non-sentinel): Left breast Procedure: Seed localized lumpectomy Histologic type: Ductal Grade: III Tubule formation: 2 Nuclear pleomorphism: 3 Mitotic: 3 Tumor size (gross measurement): 1.1 cm Margins: Greater than 0.2 cm to all margins Lymphovascular invasion: Not identified Ductal carcinoma in situ: Not identified. Lobular neoplasia: Not identified. Tumor focality: Unifocal Treatment effect: N/A Extent of tumor: Confined to breast parenchyma Lymph nodes: None examined. Breast prognostic profile: Case [id number] Estrogen receptor: 0% Progesterone receptor: 0% Her 2 neu: No amplification was detected. Ki-67: 80% A breast prognostic profile will be repeated on the case and the results reported separately. Non-neoplastic breast: No significant findings. TNM: pT1c, pNX (  JBK:kh 11-15-15) 2 of 4 FINAL for Garrette, Imonie L ([id number]) Enid Cutter MD Pathologist, Electronic  Results: IMMUNOHISTOCHEMICAL AND MORPHOMETRIC ANALYSIS PERFORMED MANUALLY Estrogen Receptor: 0%, NEGATIVE Progesterone Receptor: 0%, NEGATIVE  Results: HER2 - NEGATIVE RATIO OF HER2/CEP17 SIGNALS 1.32 AVERAGE HER2 COPY NUMBER PER CELL 3.10   RADIOGRAPHIC STUDIES: I have personally reviewed the radiological images as listed and agreed with the  findings in the report. Dg Chest Port 1 View  Result Date: 12/27/2015 CLINICAL DATA:  Right-sided Port-A-Cath placement. EXAM: PORTABLE CHEST 1 VIEW COMPARISON:  None. FINDINGS: A right-sided Port-A-Cath is now in place. The catheter is accessed. The catheter crosses over the clavicle to the right neck. The tip of the catheter is above the clavicle. This may be within the internal jugular vein. Lung volumes are low. The heart is enlarged. Mild edema is present. Left basilar airspace opacities are present. Atherosclerotic changes are present at the aortic arch. The visualized soft tissues and bony thorax are unremarkable. IMPRESSION: 1. Interval placement of right IJ Port-A-Cath. 2. No pneumothorax. 3. The tip of the catheter is above the clavicle, presumably still within the internal jugular vein. It should not be used until further assessed. The catheter may have been pulled back by soft tissue as the patient sat more upright. These results were called by telephone at the time of interpretation on 12/27/2015 at 9:25am to Dr. Rolm Bookbinder , who verbally acknowledged these results. Electronically Signed   By: San Morelle M.D.   On: 12/27/2015 09:33   Dg Fluoro Guide Cv Line Right  Result Date: 12/30/2015 CLINICAL DATA:  Breast carcinoma.  Port catheter placement. EXAM: CHEST FLUOROSCOPY FINDINGS: Fluoroscopy was provided for the procedure without radiologist in attendance. Single spot fluoroscopic image documents right IJ port catheter, tip in the proximal SVC. No pneumothorax. FLUOROSCOPY TIME:  20 seconds IMPRESSION: 1. Right IJ port catheter placement to the proximal SVC. Electronically Signed   By: Lucrezia Europe M.D.   On: 12/30/2015 11:08   Dg Fluoro Guide Cv Line-no Report  Result Date: 12/30/2015 CLINICAL DATA:  Breast carcinoma.  Port catheter placement. EXAM: CHEST FLUOROSCOPY FINDINGS: Fluoroscopy was provided for the procedure without radiologist in attendance. Single spot fluoroscopic  image documents right IJ port catheter, tip in the proximal SVC. No pneumothorax. FLUOROSCOPY TIME:  20 seconds IMPRESSION: 1. Right IJ port catheter placement to the proximal SVC. Electronically Signed   By: Lucrezia Europe M.D.   On: 12/30/2015 11:08    ASSESSMENT & PLAN:  82 year old female, with past medical history of MGUS, hypertension, arthritis, with good performance status, was found to have stage I triple negative left breast cancer by screening mammogram.  1. Malignant or new present of upper outer quadrant of left female breast, invasive ductal carcinoma, grade 3, pT1cN0M0, stage IA, ER-/PR-/HER2- --I discussed her breast mammogram, ultrasound, biopsy and final surgical path result in details -She had sentinel lymph node biopsy, however it showed normal breast parenchyma, no lymphoid tissue, her pathological node status is unknown, no ultrasound evidence of adenopathy. -We reviewed than aggressive nature of triple negative breast cancer. Giving her stage I disease, I think she probably has 20-30% risk of distant recurrence in the next 5-10 years.  -I recommend her weekly Taxol for 12 weeks as adjuvant chemotherapy to reduce her risk of cancer recurrence. -She tolerated first Taxol infusion well, noticed some muscular joint discomfort afterwards, possible related to Taxol. --Lab results reviewed with her, mild anemia, stable, proceed to cycle 2 Taxol today. -She  developed chest tightness and dyspnea during her Taxol infusion today, resolved after steroids. I'll change her Taxol to Abraxane next week to prevent infusion reaction.  2. MGUS -will continue follow up SPEP/IFE, and light chain levels every 3 month   3. Anemia  -she has chronic anemia, worse last year due to her surgery  -will check her iron study and folic acid, J49 level next time   4. Joint and a muscular pain -She has noticed mild and intermittent muscular pain in her hands, right big toe, and buttocks, possible related to  Taxol. -We'll continue monitoring  Plan -second cycle weekly taxol today. Due to her infusion reaction today, which changed approximately week -I'll see her back in 2 weeks  All questions were answered. The patient knows to call the clinic with any problems, questions or concerns.  I spent 25 minutes counseling the patient face to face. The total time spent in the appointment was 30 minutes and more than 50% was on counseling.     Truitt Merle, MD 01/03/2016

## 2016-01-03 NOTE — Telephone Encounter (Signed)
Gave patient avs report and appointments for August thru October  °

## 2016-01-03 NOTE — Addendum Note (Signed)
Addended by: Truitt Merle on: 01/03/2016 04:53 PM   Modules accepted: Orders

## 2016-01-07 ENCOUNTER — Encounter: Payer: Self-pay | Admitting: Nurse Practitioner

## 2016-01-07 DIAGNOSIS — T7840XA Allergy, unspecified, initial encounter: Secondary | ICD-10-CM | POA: Insufficient documentation

## 2016-01-07 NOTE — Progress Notes (Signed)
SYMPTOM MANAGEMENT CLINIC    Chief Complaint: Hypersensitivity reaction  HPI:  Julie [name] 82 y.o. female diagnosed with breast cancer.  Currently undergoing weekly Taxol chemotherapy regimen.  Patient presented to the [place] today.  Receive cycle 2 of her weekly Taxol chemotherapy regimen.  She developed a mild hypersensitivity reaction; which was managed per protocol.   Oncology History   Malignant neoplasm of upper outer quadrant of female breast [facility])   Staging form: Breast, AJCC 7th Edition     Pathologic stage from 11/14/2015: Stage IA (T1c, N0, cM0) - Signed by Truitt Merle, MD on 11/25/2015     Clinical stage from 11/20/2015: Stage IA (T1c, N0, M0) - Signed by Curt Bears, MD on 11/20/2015       Malignant neoplasm of upper outer quadrant of female breast ([place])   10/09/2015 Mammogram    Screening bilateral mammograms showed a possible left breast mass. Diagnostic mammogram and ultrasound showed a 7 x 7 x 7 mm mass in the left breast at 11:00 position.     10/22/2015 Receptors her2    ER negative, PR negative, HER-2 not amplified     10/22/2015 Initial Biopsy    Left breast needle core biopsy at the 1:00 position showed invasive ductal carcinoma.     10/22/2015 Initial Diagnosis    Malignant neoplasm of upper outer quadrant of female breast ([place])     11/14/2015 Surgery    Left breast lumpectomy and sentinel lymph node biopsy     11/14/2015 Pathology Results    Left breast lumpectomy showed invasive ductal carcinoma, grade 3, 1.1 cm, resection margins were negative, sentinel lymph node biopsy showed benign breast parenchyma, lymph node tissue not identified. Repeated ER PR and HER-2 were all negative.     12/20/2015 -  Chemotherapy    Weekly Taxol 80 mg/m, changed to Abraxane 80 mg/m from week 3 due to infusion reaction      Review of Systems  Respiratory: Positive for shortness of breath.   Cardiovascular: Positive for chest pain.  All other systems reviewed  and are negative.   Past Medical History:  Diagnosis Date  . Anemia   . Anxiety    takes Klonopin at bedtime  . Arthritis    takes Methotrexate weekly and Prednisone  . Blood transfusion    no abnormal reaction noted  . Cataracts, bilateral    immature  . Complication of anesthesia    wakes up during surgery  . Depression   . Diabetes mellitus without complication ([place])   . Fibromyalgia    takes Lyrica daily  . GERD (gastroesophageal reflux disease)    takes Nexium daily  . Glaucoma   . Hard of hearing   . Heart murmur   . Histoplasmosis   . History of colon polyps   . History of gout   . History of hiatal hernia   . History of shingles   . Hyperlipidemia   . Hypertension    takes Amlodipine,Metoprolol,and Losartan daily  . Incontinence of bowel   . Interstitial cystitis   . Joint pain   . Nocturia   . Peripheral neuropathy ([place])   . PONV (postoperative nausea and vomiting)   . Ulcer 1970   duodenal  . Urinary frequency   . Urinary urgency   . Weakness    numbness in both hands and feet    Past Surgical History:  Procedure Laterality Date  . ABDOMINAL HYSTERECTOMY    . accupuncture  3  yrs ago  . ANKLE FUSION Left 05/10/2014   Procedure: LEFT ANKLE ARTHRODESIS ;  Surgeon: Wylene Simmer, MD;  Location: [facility];  Service: Orthopedics;  Laterality: Left;  . ANKLE SURGERY Left   . arm surgery Left    radius  . bladder tacked     . CERVICAL FUSION    . CHOLECYSTECTOMY    . COLONOSCOPY    . CYSTOCELE REPAIR    . DILATION AND CURETTAGE OF UTERUS    . ESOPHAGOGASTRODUODENOSCOPY    . PORT A CATH REVISION N/A 12/27/2015   Procedure: PORT A CATH REVISION;  Surgeon: Rolm Bookbinder, MD;  Location: [place];  Service: General;  Laterality: N/A;  . PORTACATH PLACEMENT Right 12/27/2015   Procedure: INSERTION PORT-A-CATH WITH ULTRASOUND ;  Surgeon: Rolm Bookbinder, MD;  Location: [place];  Service: General;  Laterality: Right;  .  RADIOACTIVE SEED GUIDED MASTECTOMY WITH AXILLARY SENTINEL LYMPH NODE BIOPSY Left 11/14/2015   Procedure: RADIOACTIVE SEED GUIDED LEFT BREAST LUMPECTOMY WITH AXILLARY SENTINEL LYMPH NODE BIOPSY;  Surgeon: Rolm Bookbinder, MD;  Location: [place];  Service: General;  Laterality: Left;  RADIOACTIVE SEED GUIDED LEFT BREAST LUMPECTOMY WITH AXILLARY SENTINEL LYMPH NODE BIOPSY  . SALPINGOOPHORECTOMY Bilateral   . TOTAL KNEE ARTHROPLASTY Right 02/28/2015   Procedure: RIGHT TOTAL KNEE ARTHROPLASTY;  Surgeon: Rod Can, MD;  Location: [facility];  Service: Orthopedics;  Laterality: Right;  . UPPER GASTROINTESTINAL ENDOSCOPY    . VAGINAL HYSTERECTOMY      has Diverticulosis of colon (without mention of hemorrhage); Family history of malignant neoplasm of gastrointestinal tract; Special screening for malignant neoplasms, colon; Other symptoms involving digestive system(787.99); MGUS (monoclonal gammopathy of unknown significance); Post-traumatic arthritis of ankle; Primary osteoarthritis of right knee; Malignant neoplasm of upper outer quadrant of female breast ([place]); Port catheter in place; and Hypersensitivity reaction on her problem list.    is allergic to other and aspirin.    Medication List       Accurate as of 01/03/16 11:59 PM. Always use your most recent med list.          acetaminophen 500 MG tablet Commonly known as:  TYLENOL Take 500 mg by mouth every 6 (six) hours as needed.   amLODipine 10 MG tablet Commonly known as:  NORVASC Take 10 mg by mouth every morning.   B-D TB SYRINGE 1CC/25GX5/8" 25G X 5/8" 1 ML Misc Generic drug:  TUBERCULIN SYR 1CC/25GX5/8" USE WITH METHOTREXATE   calcium-vitamin D 500-200 MG-UNIT tablet Commonly known as:  OSCAL WITH D Take 1 tablet by mouth every morning.   clonazePAM 0.5 MG tablet Commonly known as:  KLONOPIN Take 0.5 mg by mouth at bedtime.   esomeprazole 40 MG capsule Commonly known as:  NEXIUM Take 40 mg by mouth daily  before breakfast.   ezetimibe 10 MG tablet Commonly known as:  ZETIA Take 10 mg by mouth every other day. Trial, taking every other day   fish oil-omega-3 fatty acids 1000 MG capsule Take 1 g by mouth every morning.   folic acid 1 MG tablet Commonly known as:  FOLVITE Take 2 mg by mouth every morning.   lidocaine-prilocaine cream Commonly known as:  EMLA Apply to affected area once   meloxicam 15 MG tablet Commonly known as:  MOBIC Take 15 mg by mouth daily.   metFORMIN 500 MG tablet Commonly known as:  GLUCOPHAGE Take by mouth 2 (two) times daily with a meal.   methotrexate 1 g  injection Commonly known as:  50 mg/ml Inject into the skin once a week.   metoprolol succinate 50 MG 24 hr tablet Commonly known as:  TOPROL-XL TAKE 1 TABLET AT BEDTIME ONCE A DAY ORALLY 90 DAYS   ondansetron 8 MG tablet Commonly known as:  ZOFRAN Take 1 tablet (8 mg total) by mouth 2 (two) times daily as needed (Nausea or vomiting).   predniSONE 5 MG tablet Commonly known as:  DELTASONE Take 5 mg by mouth every other day.   pregabalin 75 MG capsule Commonly known as:  LYRICA Take 75 mg by mouth 2 (two) times daily.   prochlorperazine 10 MG tablet Commonly known as:  COMPAZINE Take 1 tablet (10 mg total) by mouth every 6 (six) hours as needed (Nausea or vomiting).   traMADol 50 MG tablet Commonly known as:  ULTRAM Take by mouth every 6 (six) hours as needed. Reported on 11/25/2015   valsartan-hydrochlorothiazide 320-12.5 MG tablet Commonly known as:  DIOVAN-HCT Take 1 tablet by mouth daily.   vitamin E 400 UNIT capsule Take 400 Units by mouth daily.        PHYSICAL EXAMINATION  Oncology Vitals 01/03/2016 01/03/2016  Height - -  Weight - -  Weight (lbs) - -  BMI (kg/m2) - -  Temp - 97.8  Pulse 64 68  Resp 18 18  Resp (Historical as of 12/24/11) - -  SpO2 - 100  BSA (m2) - -   BP Readings from Last 2 Encounters:  01/03/16 (!) 151/74  01/03/16 (!) 156/71    Physical  Exam  Constitutional: She is oriented to person, place, and time and well-developed, well-nourished, and in no distress.  HENT:  Head: Normocephalic and atraumatic.  Mouth/Throat: Oropharynx is clear and moist.  Eyes: Conjunctivae and EOM are normal. Pupils are equal, round, and reactive to light. Right eye exhibits no discharge. Left eye exhibits no discharge. No scleral icterus.  Neck: Normal range of motion. Neck supple. No JVD present. No tracheal deviation present. No thyromegaly present.  Cardiovascular: Normal rate, regular rhythm, normal heart sounds and intact distal pulses.   Pulmonary/Chest: Effort normal and breath sounds normal. No respiratory distress. She has no wheezes. She has no rales. She exhibits no tenderness.  Abdominal: Soft. Bowel sounds are normal. She exhibits no distension and no mass. There is no tenderness. There is no rebound and no guarding.  Musculoskeletal: Normal range of motion. She exhibits no edema or tenderness.  Lymphadenopathy:    She has no cervical adenopathy.  Neurological: She is alert and oriented to person, place, and time. Gait normal.  Skin: Skin is warm and dry. No rash noted. No erythema. No pallor.  Psychiatric: Affect normal.  Nursing note and vitals reviewed.   LABORATORY DATA:. Appointment on 01/03/2016  Component Date Value Ref Range Status  . WBC 01/03/2016 6.5  3.9 - 10.3 10e3/uL Final  . NEUT# 01/03/2016 4.3  1.5 - 6.5 10e3/uL Final  . HGB 01/03/2016 10.7* 11.6 - 15.9 g/dL Final  . HCT 01/03/2016 33.8* 34.8 - 46.6 % Final  . Platelets 01/03/2016 246  145 - 400 10e3/uL Final  . MCV 01/03/2016 84.6  79.5 - 101.0 fL Final  . MCH 01/03/2016 26.9  25.1 - 34.0 pg Final  . MCHC 01/03/2016 31.8  31.5 - 36.0 g/dL Final  . RBC 01/03/2016 3.99  3.70 - 5.45 10e6/uL Final  . RDW 01/03/2016 14.8* 11.2 - 14.5 % Final  . lymph# 01/03/2016 1.0  0.9 - 3.3 10e3/uL  Final  . MONO# 01/03/2016 0.9  0.1 - 0.9 10e3/uL Final  . Eosinophils Absolute  01/03/2016 0.2  0.0 - 0.5 10e3/uL Final  . Basophils Absolute 01/03/2016 0.1  0.0 - 0.1 10e3/uL Final  . NEUT% 01/03/2016 66.8  38.4 - 76.8 % Final  . LYMPH% 01/03/2016 14.8  14.0 - 49.7 % Final  . MONO% 01/03/2016 13.6  0.0 - 14.0 % Final  . EOS% 01/03/2016 3.5  0.0 - 7.0 % Final  . BASO% 01/03/2016 1.3  0.0 - 2.0 % Final  . Sodium 01/03/2016 143  136 - 145 mEq/L Final  . Potassium 01/03/2016 3.7  3.5 - 5.1 mEq/L Final  . Chloride 01/03/2016 107  98 - 109 mEq/L Final  . CO2 01/03/2016 27  22 - 29 mEq/L Final  . Glucose 01/03/2016 80  70 - 140 mg/dl Final  . BUN 01/03/2016 19.4  7.0 - 26.0 mg/dL Final  . Creatinine 01/03/2016 1.0  0.6 - 1.1 mg/dL Final  . Total Bilirubin 01/03/2016 0.65  0.20 - 1.20 mg/dL Final  . Alkaline Phosphatase 01/03/2016 70  40 - 150 U/L Final  . AST 01/03/2016 16  5 - 34 U/L Final  . ALT 01/03/2016 11  0 - 55 U/L Final  . Total Protein 01/03/2016 6.9  6.4 - 8.3 g/dL Final  . Albumin 01/03/2016 3.4* 3.5 - 5.0 g/dL Final  . Calcium 01/03/2016 9.4  8.4 - 10.4 mg/dL Final  . Anion Gap 01/03/2016 9  3 - 11 mEq/L Final  . EGFR 01/03/2016 62* >90 ml/min/1.73 m2 Final    RADIOGRAPHIC STUDIES: No results found.  ASSESSMENT/PLAN:    Malignant neoplasm of upper outer quadrant of female breast [facility]) Patient presented to the [place] [facility] today to receive cycle 2 of weekly Taxol chemotherapy regimen.  She developed a mild hypersensitivity reaction; which  was managed per hypersensitivity protocol.  See further notes for details.  Patient is scheduled to return on 01/10/2016 for labs, flush, and chemotherapy  Hypersensitivity reaction Patient presented to the [place] today to receive her second cycle of weekly Taxol chemotherapy.  She developed a hypersensitivity reaction which consisted of some chest discomfort and shortness of breath.  The chemotherapy infusion was held; and patient received both Benadryl and solumedrol per hypersensitivity protocol.  All  symptoms resolved; and patient was able to complete her chemotherapy as planned today.     Patient stated understanding of all instructions; and was in agreement with this plan of care. The patient knows to call the clinic with any problems, questions or concerns.   Total time spent with patient was  15  minutes;  with greater than 75 percent of that time spent in face to face counseling regarding patient's symptoms,  and coordination of care and follow up.  Disclaimer:This dictation was prepared with Dragon/digital dictation along with [employer]. Any transcriptional errors that result from this process are unintentional.  Drue Second, NP 01/07/2016

## 2016-01-07 NOTE — Assessment & Plan Note (Signed)
Patient presented to the [facility] today to receive cycle 2 of weekly Taxol chemotherapy regimen.  She developed a mild hypersensitivity reaction; which  was managed per hypersensitivity protocol.  See further notes for details.  Patient is scheduled to return on 01/10/2016 for labs, flush, and chemotherapy

## 2016-01-07 NOTE — Assessment & Plan Note (Signed)
Patient presented to the [facility] [place] today to receive her second cycle of weekly Taxol chemotherapy.  She developed a hypersensitivity reaction which consisted of some chest discomfort and shortness of breath.  The chemotherapy infusion was held; and patient received both Benadryl and solumedrol per hypersensitivity protocol.  All symptoms resolved; and patient was able to complete her chemotherapy as planned today.

## 2016-01-08 DIAGNOSIS — M6281 Muscle weakness (generalized): Secondary | ICD-10-CM | POA: Diagnosis not present

## 2016-01-08 DIAGNOSIS — Z79899 Other long term (current) drug therapy: Secondary | ICD-10-CM | POA: Diagnosis not present

## 2016-01-08 DIAGNOSIS — M0579 Rheumatoid arthritis with rheumatoid factor of multiple sites without organ or systems involvement: Secondary | ICD-10-CM | POA: Diagnosis not present

## 2016-01-10 ENCOUNTER — Other Ambulatory Visit: Payer: Self-pay | Admitting: Hematology

## 2016-01-10 ENCOUNTER — Other Ambulatory Visit (HOSPITAL_BASED_OUTPATIENT_CLINIC_OR_DEPARTMENT_OTHER): Payer: Medicare Other

## 2016-01-10 ENCOUNTER — Ambulatory Visit (HOSPITAL_BASED_OUTPATIENT_CLINIC_OR_DEPARTMENT_OTHER): Payer: Medicare Other

## 2016-01-10 ENCOUNTER — Ambulatory Visit: Payer: Self-pay

## 2016-01-10 VITALS — BP 104/53 | HR 61 | Temp 97.8°F | Resp 20

## 2016-01-10 DIAGNOSIS — Z5111 Encounter for antineoplastic chemotherapy: Secondary | ICD-10-CM | POA: Diagnosis not present

## 2016-01-10 DIAGNOSIS — C50412 Malignant neoplasm of upper-outer quadrant of left female breast: Secondary | ICD-10-CM | POA: Diagnosis not present

## 2016-01-10 LAB — CBC WITH DIFFERENTIAL/PLATELET
BASO%: 1.5 % (ref 0.0–2.0)
Basophils Absolute: 0.1 10*3/uL (ref 0.0–0.1)
EOS%: 3.8 % (ref 0.0–7.0)
Eosinophils Absolute: 0.2 10*3/uL (ref 0.0–0.5)
HEMATOCRIT: 32.1 % — AB (ref 34.8–46.6)
HEMOGLOBIN: 10.2 g/dL — AB (ref 11.6–15.9)
LYMPH#: 1.3 10*3/uL (ref 0.9–3.3)
LYMPH%: 24.3 % (ref 14.0–49.7)
MCH: 26.9 pg (ref 25.1–34.0)
MCHC: 31.8 g/dL (ref 31.5–36.0)
MCV: 84.8 fL (ref 79.5–101.0)
MONO#: 0.3 10*3/uL (ref 0.1–0.9)
MONO%: 6.6 % (ref 0.0–14.0)
NEUT%: 63.8 % (ref 38.4–76.8)
NEUTROS ABS: 3.3 10*3/uL (ref 1.5–6.5)
PLATELETS: 260 10*3/uL (ref 145–400)
RBC: 3.79 10*6/uL (ref 3.70–5.45)
RDW: 15.1 % — AB (ref 11.2–14.5)
WBC: 5.2 10*3/uL (ref 3.9–10.3)

## 2016-01-10 LAB — COMPREHENSIVE METABOLIC PANEL
ALBUMIN: 3.1 g/dL — AB (ref 3.5–5.0)
ALK PHOS: 67 U/L (ref 40–150)
ALT: 10 U/L (ref 0–55)
ANION GAP: 6 meq/L (ref 3–11)
AST: 17 U/L (ref 5–34)
BILIRUBIN TOTAL: 0.46 mg/dL (ref 0.20–1.20)
BUN: 26.3 mg/dL — ABNORMAL HIGH (ref 7.0–26.0)
CALCIUM: 9.3 mg/dL (ref 8.4–10.4)
CO2: 28 mEq/L (ref 22–29)
CREATININE: 0.9 mg/dL (ref 0.6–1.1)
Chloride: 108 mEq/L (ref 98–109)
EGFR: 69 mL/min/{1.73_m2} — ABNORMAL LOW (ref >90)
Glucose: 73 mg/dl (ref 70–140)
Potassium: 4.2 mEq/L (ref 3.5–5.1)
Sodium: 142 mEq/L (ref 136–145)
TOTAL PROTEIN: 6.4 g/dL (ref 6.4–8.3)

## 2016-01-10 MED ORDER — SODIUM CHLORIDE 0.9% FLUSH
10.0000 mL | INTRAVENOUS | Status: DC | PRN
  Administered 2016-01-10: 10 mL
  Filled 2016-01-10: qty 10

## 2016-01-10 MED ORDER — SODIUM CHLORIDE 0.9 % IV SOLN
Freq: Once | INTRAVENOUS | Status: AC
  Administered 2016-01-10: 12:00:00 via INTRAVENOUS

## 2016-01-10 MED ORDER — FAMOTIDINE IN NACL 20-0.9 MG/50ML-% IV SOLN
20.0000 mg | Freq: Once | INTRAVENOUS | Status: AC
  Administered 2016-01-10: 20 mg via INTRAVENOUS

## 2016-01-10 MED ORDER — ONDANSETRON HCL 8 MG PO TABS
ORAL_TABLET | ORAL | Status: AC
  Filled 2016-01-10: qty 1

## 2016-01-10 MED ORDER — PACLITAXEL PROTEIN-BOUND CHEMO INJECTION 100 MG
100.0000 mg/m2 | Freq: Once | INTRAVENOUS | Status: AC
  Administered 2016-01-10: 200 mg via INTRAVENOUS
  Filled 2016-01-10: qty 40

## 2016-01-10 MED ORDER — HEPARIN SOD (PORK) LOCK FLUSH 100 UNIT/ML IV SOLN
500.0000 [IU] | Freq: Once | INTRAVENOUS | Status: AC | PRN
  Administered 2016-01-10: 500 [IU]
  Filled 2016-01-10: qty 5

## 2016-01-10 MED ORDER — ONDANSETRON HCL 8 MG PO TABS
8.0000 mg | ORAL_TABLET | Freq: Once | ORAL | Status: AC
  Administered 2016-01-10: 8 mg via ORAL

## 2016-01-10 NOTE — Patient Instructions (Signed)
[facility] Discharge Instructions for Patients Receiving Chemotherapy  Today you received the following chemotherapy agents:  Abraxane  To help prevent nausea and vomiting after your treatment, we encourage you to take your nausea medication as ordered per MD.   If you develop nausea and vomiting that is not controlled by your nausea medication, call the clinic.   BELOW ARE SYMPTOMS THAT SHOULD BE REPORTED IMMEDIATELY:  *FEVER GREATER THAN 100.5 F  *CHILLS WITH OR WITHOUT FEVER  NAUSEA AND VOMITING THAT IS NOT CONTROLLED WITH YOUR NAUSEA MEDICATION  *UNUSUAL SHORTNESS OF BREATH  *UNUSUAL BRUISING OR BLEEDING  TENDERNESS IN MOUTH AND THROAT WITH OR WITHOUT PRESENCE OF ULCERS  *URINARY PROBLEMS  *BOWEL PROBLEMS  UNUSUAL RASH Items with * indicate a potential emergency and should be followed up as soon as possible.  Feel free to call the clinic you have any questions or concerns. The clinic phone number is [phone].  Please show the CHEMO ALERT CARD at check-in to the Emergency Department and triage nurse.   

## 2016-01-13 ENCOUNTER — Telehealth: Payer: Self-pay | Admitting: *Deleted

## 2016-01-13 ENCOUNTER — Other Ambulatory Visit: Payer: Self-pay | Admitting: Hematology

## 2016-01-13 MED ORDER — PANTOPRAZOLE SODIUM 40 MG PO TBEC: 40.0000 mg | DELAYED_RELEASE_TABLET | Freq: Every day | ORAL | 0 refills | Status: DC

## 2016-01-13 NOTE — Telephone Encounter (Signed)
See Today's phone note.  Dai=ughter called before chemotherapy F/U call made.

## 2016-01-13 NOTE — Telephone Encounter (Signed)
Dtr. Julie [name] is calling to report that her mother is having several sx after chemotherapy on Friday.  Her ear, throat and scalp are hurting.  Dtr. Thinks it is probably both ears.  She doesn't think she is running a temperature.  She has no cough and no trouble swallowing.  She is able to eat and drink ok.  She has constant heartburn which is not new.  She thinks her heartburn medication may not be strong enough.  She thinks this is probably nexium.   Dtr. Call back number is [phone].

## 2016-01-13 NOTE — Telephone Encounter (Signed)
I called back and left a message for pt's daughter [name]: Her here, throat and scalp pain are probably related to chemotherapy, she can take Tylenol as needed. I'll call in Protonix to replace Nexium, she can also take Maalox as needed for her acid reflex. I encouraged her to call back if she has further questions.  Truitt Merle MD

## 2016-01-17 ENCOUNTER — Other Ambulatory Visit (HOSPITAL_BASED_OUTPATIENT_CLINIC_OR_DEPARTMENT_OTHER): Payer: Medicare Other

## 2016-01-17 ENCOUNTER — Ambulatory Visit (HOSPITAL_BASED_OUTPATIENT_CLINIC_OR_DEPARTMENT_OTHER): Payer: Medicare Other

## 2016-01-17 ENCOUNTER — Ambulatory Visit (HOSPITAL_BASED_OUTPATIENT_CLINIC_OR_DEPARTMENT_OTHER): Payer: Medicare Other | Admitting: Hematology

## 2016-01-17 ENCOUNTER — Ambulatory Visit: Payer: Medicare Other

## 2016-01-17 VITALS — BP 148/79 | HR 64 | Temp 97.8°F | Resp 18 | Ht 63.0 in | Wt 217.8 lb

## 2016-01-17 DIAGNOSIS — C50412 Malignant neoplasm of upper-outer quadrant of left female breast: Secondary | ICD-10-CM

## 2016-01-17 DIAGNOSIS — D649 Anemia, unspecified: Secondary | ICD-10-CM | POA: Diagnosis not present

## 2016-01-17 DIAGNOSIS — D472 Monoclonal gammopathy: Secondary | ICD-10-CM

## 2016-01-17 DIAGNOSIS — Z5111 Encounter for antineoplastic chemotherapy: Secondary | ICD-10-CM

## 2016-01-17 DIAGNOSIS — Z171 Estrogen receptor negative status [ER-]: Secondary | ICD-10-CM | POA: Diagnosis not present

## 2016-01-17 DIAGNOSIS — Z95828 Presence of other vascular implants and grafts: Secondary | ICD-10-CM

## 2016-01-17 LAB — COMPREHENSIVE METABOLIC PANEL
ALBUMIN: 3.2 g/dL — AB (ref 3.5–5.0)
ALK PHOS: 69 U/L (ref 40–150)
ALT: 12 U/L (ref 0–55)
ANION GAP: 8 meq/L (ref 3–11)
AST: 21 U/L (ref 5–34)
BILIRUBIN TOTAL: 0.48 mg/dL (ref 0.20–1.20)
BUN: 21.5 mg/dL (ref 7.0–26.0)
CALCIUM: 9.3 mg/dL (ref 8.4–10.4)
CO2: 26 meq/L (ref 22–29)
Chloride: 108 mEq/L (ref 98–109)
Creatinine: 1 mg/dL (ref 0.6–1.1)
EGFR: 60 mL/min/{1.73_m2} — AB (ref >90)
Glucose: 83 mg/dl (ref 70–140)
Potassium: 4.4 mEq/L (ref 3.5–5.1)
Sodium: 142 mEq/L (ref 136–145)
TOTAL PROTEIN: 6.6 g/dL (ref 6.4–8.3)

## 2016-01-17 LAB — CBC WITH DIFFERENTIAL/PLATELET
BASO%: 0.3 % (ref 0.0–2.0)
Basophils Absolute: 0 10*3/uL (ref 0.0–0.1)
EOS ABS: 0.2 10*3/uL (ref 0.0–0.5)
EOS%: 6.5 % (ref 0.0–7.0)
HEMATOCRIT: 30.1 % — AB (ref 34.8–46.6)
HGB: 9.7 g/dL — ABNORMAL LOW (ref 11.6–15.9)
LYMPH%: 37.3 % (ref 14.0–49.7)
MCH: 27.8 pg (ref 25.1–34.0)
MCHC: 32.2 g/dL (ref 31.5–36.0)
MCV: 86.2 fL (ref 79.5–101.0)
MONO#: 0.2 10*3/uL (ref 0.1–0.9)
MONO%: 5.8 % (ref 0.0–14.0)
NEUT%: 50.1 % (ref 38.4–76.8)
NEUTROS ABS: 1.5 10*3/uL (ref 1.5–6.5)
PLATELETS: 252 10*3/uL (ref 145–400)
RBC: 3.49 10*6/uL — AB (ref 3.70–5.45)
RDW: 15 % — ABNORMAL HIGH (ref 11.2–14.5)
WBC: 2.9 10*3/uL — AB (ref 3.9–10.3)
lymph#: 1.1 10*3/uL (ref 0.9–3.3)

## 2016-01-17 MED ORDER — SODIUM CHLORIDE 0.9% FLUSH
10.0000 mL | INTRAVENOUS | Status: DC | PRN
  Administered 2016-01-17: 10 mL
  Filled 2016-01-17: qty 10

## 2016-01-17 MED ORDER — SODIUM CHLORIDE 0.9 % IJ SOLN
10.0000 mL | INTRAMUSCULAR | Status: DC | PRN
  Administered 2016-01-17: 10 mL via INTRAVENOUS
  Filled 2016-01-17: qty 10

## 2016-01-17 MED ORDER — PACLITAXEL PROTEIN-BOUND CHEMO INJECTION 100 MG
100.0000 mg/m2 | Freq: Once | INTRAVENOUS | Status: AC
  Administered 2016-01-17: 200 mg via INTRAVENOUS
  Filled 2016-01-17: qty 40

## 2016-01-17 MED ORDER — ONDANSETRON HCL 8 MG PO TABS
ORAL_TABLET | ORAL | Status: AC
  Filled 2016-01-17: qty 1

## 2016-01-17 MED ORDER — FAMOTIDINE IN NACL 20-0.9 MG/50ML-% IV SOLN
20.0000 mg | Freq: Once | INTRAVENOUS | Status: AC
  Administered 2016-01-17: 20 mg via INTRAVENOUS

## 2016-01-17 MED ORDER — HEPARIN SOD (PORK) LOCK FLUSH 100 UNIT/ML IV SOLN
500.0000 [IU] | Freq: Once | INTRAVENOUS | Status: AC | PRN
  Administered 2016-01-17: 500 [IU]
  Filled 2016-01-17: qty 5

## 2016-01-17 MED ORDER — ONDANSETRON HCL 8 MG PO TABS
8.0000 mg | ORAL_TABLET | Freq: Once | ORAL | Status: AC
  Administered 2016-01-17: 8 mg via ORAL

## 2016-01-17 MED ORDER — FAMOTIDINE IN NACL 20-0.9 MG/50ML-% IV SOLN
INTRAVENOUS | Status: AC
  Filled 2016-01-17: qty 50

## 2016-01-17 MED ORDER — SODIUM CHLORIDE 0.9 % IV SOLN
Freq: Once | INTRAVENOUS | Status: AC
  Administered 2016-01-17: 14:00:00 via INTRAVENOUS

## 2016-01-17 NOTE — Patient Instructions (Signed)

## 2016-01-17 NOTE — Patient Instructions (Signed)
[facility] Discharge Instructions for Patients Receiving Chemotherapy  Today you received the following chemotherapy agents: Abraxane   To help prevent nausea and vomiting after your treatment, we encourage you to take your nausea medication as directed.    If you develop nausea and vomiting that is not controlled by your nausea medication, call the clinic.   BELOW ARE SYMPTOMS THAT SHOULD BE REPORTED IMMEDIATELY:  *FEVER GREATER THAN 100.5 F  *CHILLS WITH OR WITHOUT FEVER  NAUSEA AND VOMITING THAT IS NOT CONTROLLED WITH YOUR NAUSEA MEDICATION  *UNUSUAL SHORTNESS OF BREATH  *UNUSUAL BRUISING OR BLEEDING  TENDERNESS IN MOUTH AND THROAT WITH OR WITHOUT PRESENCE OF ULCERS  *URINARY PROBLEMS  *BOWEL PROBLEMS  UNUSUAL RASH Items with * indicate a potential emergency and should be followed up as soon as possible.  Feel free to call the clinic you have any questions or concerns. The clinic phone number is [phone].  Please show the CHEMO ALERT CARD at check-in to the Emergency Department and triage nurse.   

## 2016-01-17 NOTE — Progress Notes (Signed)
[place]  Telephone:([phone]) [phone] Fax:(336) [phone]  Clinic follow up Note   Patient Care Team: [name], MD as PCP - General (Internal Medicine) Valinda Party, MD as Referring Physician (Rheumatology) 01/17/2016  CHIEF COMPLAINTS:  Follow up left breast cancer   Oncology History   Malignant neoplasm of upper outer quadrant of female breast [facility])   Staging form: Breast, AJCC 7th Edition     Pathologic stage from 11/14/2015: Stage IA (T1c, N0, cM0) - Signed by Truitt Merle, MD on 11/25/2015     Clinical stage from 11/20/2015: Stage IA (T1c, N0, M0) - Signed by Curt Bears, MD on 11/20/2015       Malignant neoplasm of upper outer quadrant of female breast ([place])   10/09/2015 Mammogram    Screening bilateral mammograms showed a possible left breast mass. Diagnostic mammogram and ultrasound showed a 7 x 7 x 7 mm mass in the left breast at 11:00 position.      10/22/2015 Receptors her2    ER negative, PR negative, HER-2 not amplified      10/22/2015 Initial Biopsy    Left breast needle core biopsy at the 1:00 position showed invasive ductal carcinoma.      10/22/2015 Initial Diagnosis    Malignant neoplasm of upper outer quadrant of female breast ([place])      11/14/2015 Surgery    Left breast lumpectomy and sentinel lymph node biopsy      11/14/2015 Pathology Results    Left breast lumpectomy showed invasive ductal carcinoma, grade 3, 1.1 cm, resection margins were negative, sentinel lymph node biopsy showed benign breast parenchyma, lymph node tissue not identified. Repeated ER PR and HER-2 were all negative.      12/20/2015 -  Chemotherapy    Weekly Taxol 80 mg/m, changed to Abraxane 80 mg/m from week 3 due to infusion reaction       HISTORY OF PRESENTING ILLNESS:  Julie [name] 82 y.o. female is here because of her recently diagnosed left breast cancer. She has been seen my partner Dr. Julien Nordmann for MGUS, and was referred to me to discuss her adjuvant  therapy for breast cancer. She is accompanied by her daughter to the clinic today.  Her breast cancer was discovered by screening mammogram. She has no palpable breast mass, skin change or nipple discharge. She denies any new constitutional symptoms. The diagnostic mammogram and ultrasound showed a 7 mm mass in the left breast, biopsy showed invasive ductal carcinoma, triple negative. She was seen by breast surgeon Dr. [name], and underwent left rest lumpectomy and sentinel lymph node biopsy on 11/14/2015.   She had a multiple orthopedic surgeries in the past, takes tramadol as needed. She lives with her daughter, pretty independent, does not exercise regularly, but it goes out for shopping, playing cards, etc.  GYN HISTORY  Menarchal: 16 LMP: 69 (hysrectomy)  Contraceptive: 2 HRT: 1 yr  G5P5: she did breast feeding to 2 children   CURRENT THERAPY: weekly Taxol '80mg'$ /m2, started on 12/20/2015  INTERIM HISTORY: Julie [name] returns for follow-up and week 4 chemotherapy. She is accompanied by 2 of her daughters to the clinic today. She has been tolerating chemotherapy well overall, without significant nausea, change of her appetite or diarrhea. She complains of muscle cramps lately, more frequent than before, no significant neuropathy. She has good appetite, has been eating well, no other complaints.  MEDICAL HISTORY:  Past Medical History:  Diagnosis Date  . Anemia   . Anxiety  takes Klonopin at bedtime  . Arthritis    takes Methotrexate weekly and Prednisone  . Blood transfusion    no abnormal reaction noted  . Cataracts, bilateral    immature  . Complication of anesthesia    wakes up during surgery  . Depression   . Diabetes mellitus without complication (HCC)   . Fibromyalgia    takes Lyrica daily  . GERD (gastroesophageal reflux disease)    takes Nexium daily  . Glaucoma   . Hard of hearing   . Heart murmur   . Histoplasmosis   . History of colon polyps   . History of gout     . History of hiatal hernia   . History of shingles   . Hyperlipidemia   . Hypertension    takes Amlodipine,Metoprolol,and Losartan daily  . Incontinence of bowel   . Interstitial cystitis   . Joint pain   . Nocturia   . Peripheral neuropathy (HCC)   . PONV (postoperative nausea and vomiting)   . Ulcer 1970   duodenal  . Urinary frequency   . Urinary urgency   . Weakness    numbness in both hands and feet    SURGICAL HISTORY: Past Surgical History:  Procedure Laterality Date  . ABDOMINAL HYSTERECTOMY    . accupuncture  3 yrs ago  . ANKLE FUSION Left 05/10/2014   Procedure: LEFT ANKLE ARTHRODESIS ;  Surgeon: Toni Arthurs, MD;  Location: [facility] OR;  Service: Orthopedics;  Laterality: Left;  . ANKLE SURGERY Left   . arm surgery Left    radius  . bladder tacked     . CERVICAL FUSION    . CHOLECYSTECTOMY    . COLONOSCOPY    . CYSTOCELE REPAIR    . DILATION AND CURETTAGE OF UTERUS    . ESOPHAGOGASTRODUODENOSCOPY    . PORT A CATH REVISION N/A 12/27/2015   Procedure: PORT A CATH REVISION;  Surgeon: Emelia Loron, MD;  Location: [facility];  Service: General;  Laterality: N/A;  . PORTACATH PLACEMENT Right 12/27/2015   Procedure: INSERTION PORT-A-CATH WITH ULTRASOUND ;  Surgeon: Emelia Loron, MD;  Location: [facility];  Service: General;  Laterality: Right;  . RADIOACTIVE SEED GUIDED MASTECTOMY WITH AXILLARY SENTINEL LYMPH NODE BIOPSY Left 11/14/2015   Procedure: RADIOACTIVE SEED GUIDED LEFT BREAST LUMPECTOMY WITH AXILLARY SENTINEL LYMPH NODE BIOPSY;  Surgeon: Emelia Loron, MD;  Location: [facility];  Service: General;  Laterality: Left;  RADIOACTIVE SEED GUIDED LEFT BREAST LUMPECTOMY WITH AXILLARY SENTINEL LYMPH NODE BIOPSY  . SALPINGOOPHORECTOMY Bilateral   . TOTAL KNEE ARTHROPLASTY Right 02/28/2015   Procedure: RIGHT TOTAL KNEE ARTHROPLASTY;  Surgeon: Samson Frederic, MD;  Location: [facility] ORS;  Service: Orthopedics;  Laterality: Right;   . UPPER GASTROINTESTINAL ENDOSCOPY    . VAGINAL HYSTERECTOMY      SOCIAL HISTORY: Social History   Social History  . Marital status: Divorced    Spouse name: N/A  . Number of children: N/A  . Years of education: N/A   Occupational History  . Not on file.   Social History Main Topics  . Smoking status: Never Smoker  . Smokeless tobacco: Never Used  . Alcohol use No     Comment: IN [place] ONLY  . Drug use: No  . Sexual activity: Not on file   Other Topics Concern  . Not on file   Social History Narrative  . No narrative on file    FAMILY HISTORY: Family History  Problem  Relation Age of Onset  . Colon cancer Sister 42  . Esophageal cancer Brother 54  . Breast cancer Mother     ALLERGIES:  is allergic to other and aspirin.  MEDICATIONS:  Current Outpatient Prescriptions  Medication Sig Dispense Refill  . acetaminophen (TYLENOL) 500 MG tablet Take 500 mg by mouth every 6 (six) hours as needed.    [name] amLODipine (NORVASC) 10 MG tablet Take 10 mg by mouth every morning.     . B-D TB SYRINGE 1CC/25GX5/8" 25G X 5/8" 1 ML MISC USE WITH METHOTREXATE  0  . calcium-vitamin D (OSCAL WITH D) 500-200 MG-UNIT per tablet Take 1 tablet by mouth every morning.    . clonazePAM (KLONOPIN) 0.5 MG tablet Take 0.5 mg by mouth at bedtime.    [name] esomeprazole (NEXIUM) 40 MG capsule Take 40 mg by mouth daily before breakfast.      . ezetimibe (ZETIA) 10 MG tablet Take 10 mg by mouth every other day. Trial, taking every other day    . fish oil-omega-3 fatty acids 1000 MG capsule Take 1 g by mouth every morning.     . folic acid (FOLVITE) 1 MG tablet Take 2 mg by mouth every morning.     . lidocaine-prilocaine (EMLA) cream Apply to affected area once 30 g 3  . meloxicam (MOBIC) 15 MG tablet Take 15 mg by mouth daily.  11  . metFORMIN (GLUCOPHAGE) 500 MG tablet Take by mouth 2 (two) times daily with a meal.    . methotrexate (50 MG/ML) 1 g injection Inject into the skin once a week.    .  metoprolol succinate (TOPROL-XL) 50 MG 24 hr tablet TAKE 1 TABLET AT BEDTIME ONCE A DAY ORALLY 90 DAYS  2  . ondansetron (ZOFRAN) 8 MG tablet Take 1 tablet (8 mg total) by mouth 2 (two) times daily as needed (Nausea or vomiting). 30 tablet 1  . pantoprazole (PROTONIX) 40 MG tablet Take 1 tablet (40 mg total) by mouth daily. 30 tablet 0  . predniSONE (DELTASONE) 5 MG tablet Take 5 mg by mouth every other day.     . pregabalin (LYRICA) 75 MG capsule Take 75 mg by mouth 2 (two) times daily.    . prochlorperazine (COMPAZINE) 10 MG tablet Take 1 tablet (10 mg total) by mouth every 6 (six) hours as needed (Nausea or vomiting). 30 tablet 1  . traMADol (ULTRAM) 50 MG tablet Take by mouth every 6 (six) hours as needed. Reported on 11/25/2015    . valsartan-hydrochlorothiazide (DIOVAN-HCT) 320-12.5 MG per tablet Take 1 tablet by mouth daily.    . vitamin E 400 UNIT capsule Take 400 Units by mouth daily.     No current facility-administered medications for this visit.     REVIEW OF SYSTEMS:   Constitutional: Denies fevers, chills or abnormal night sweats Eyes: Denies blurriness of vision, double vision or watery eyes Ears, nose, mouth, throat, and face: Denies mucositis or sore throat Respiratory: Denies cough, dyspnea or wheezes Cardiovascular: Denies palpitation, chest discomfort or lower extremity swelling Gastrointestinal:  Denies nausea, heartburn or change in bowel habits Skin: Denies abnormal skin rashes Lymphatics: Denies new lymphadenopathy or easy bruising Neurological:Denies numbness, tingling or new weaknesses Behavioral/Psych: Mood is stable, no new changes  All other systems were reviewed with the patient and are negative.  PHYSICAL EXAMINATION: ECOG PERFORMANCE STATUS: 1 - Symptomatic but completely ambulatory  Vitals:   01/17/16 1213  BP: (!) 148/79  Pulse: 64  Resp: 18  Temp:  97.8 F (36.6 C)   Filed Weights   01/17/16 1213  Weight: 217 lb 12.8 oz (98.8 kg)     GENERAL:alert, no distress and comfortable SKIN: skin color, texture, turgor are normal, no rashes or significant lesions EYES: normal, conjunctiva are pink and non-injected, sclera clear OROPHARYNX:no exudate, no erythema and lips, buccal mucosa, and tongue normal  NECK: supple, thyroid normal size, non-tender, without nodularity LYMPH:  no palpable lymphadenopathy in the cervical, axillary or inguinal LUNGS: clear to auscultation and percussion with normal breathing effort HEART: regular rate & rhythm and no murmurs and no lower extremity edema ABDOMEN:abdomen soft, non-tender and normal bowel sounds Musculoskeletal:no cyanosis of digits and no clubbing  PSYCH: alert & oriented x 3 with fluent speech NEURO: no focal motor/sensory deficits Breasts: Breast inspection showed them to be symmetrical with no nipple discharge. Surgical incisions in the left breast and axilla have healed well. No skin erythema or discharge. Mild bruise at her port site. Palpation of the breasts and axilla revealed no obvious mass that I could appreciate.   LABORATORY DATA:  I have reviewed the data as listed CBC Latest Ref Rng & Units 01/17/2016 01/10/2016 01/03/2016  WBC 3.9 - 10.3 10e3/uL 2.9(L) 5.2 6.5  Hemoglobin 11.6 - 15.9 g/dL 9.7(L) 10.2(L) 10.7(L)  Hematocrit 34.8 - 46.6 % 30.1(L) 32.1(L) 33.8(L)  Platelets 145 - 400 10e3/uL 252 260 246   CMP Latest Ref Rng & Units 01/17/2016 01/10/2016 01/03/2016  Glucose 70 - 140 mg/dl 83 73 80  BUN 7.0 - 26.0 mg/dL 21.5 26.3(H) 19.4  Creatinine 0.6 - 1.1 mg/dL 1.0 0.9 1.0  Sodium 136 - 145 mEq/L 142 142 143  Potassium 3.5 - 5.1 mEq/L 4.4 4.2 3.7  Chloride 101 - 111 mmol/L - - -  CO2 22 - 29 mEq/L '26 28 27  '$ Calcium 8.4 - 10.4 mg/dL 9.3 9.3 9.4  Total Protein 6.4 - 8.3 g/dL 6.6 6.4 6.9  Total Bilirubin 0.20 - 1.20 mg/dL 0.48 0.46 0.65  Alkaline Phos 40 - 150 U/L 69 67 70  AST 5 - 34 U/L '21 17 16  '$ ALT 0 - 55 U/L '12 10 11    '$ PATHOLOGY REPORT  Diagnosis  11/14/2015 1. Breast, lumpectomy, Left - INVASIVE DUCTAL CARCINOMA, GRADE III/III, SPANNING 1.1 CM. - THE RESECTION MARGINS ARE NEGATIVE FOR CARCINOMA. - SEE ONCOLOGY TABLE BELOW. 2. Lymph node, sentinel, biopsy, Left Axillary - BENIGN BREAST PARENCHYMA. - LYMPH NODAL TISSUE IS NOT IDENTIFIED. - THERE IS NO EVIDENCE OF MALIGNANCY. Microscopic Comment 1. BREAST, INVASIVE TUMOR, WITH LYMPH NODES PRESENT Specimen, including laterality and lymph node sampling (sentinel, non-sentinel): Left breast Procedure: Seed localized lumpectomy Histologic type: Ductal Grade: III Tubule formation: 2 Nuclear pleomorphism: 3 Mitotic: 3 Tumor size (gross measurement): 1.1 cm Margins: Greater than 0.2 cm to all margins Lymphovascular invasion: Not identified Ductal carcinoma in situ: Not identified. Lobular neoplasia: Not identified. Tumor focality: Unifocal Treatment effect: N/A Extent of tumor: Confined to breast parenchyma Lymph nodes: None examined. Breast prognostic profile: Case [id number] Estrogen receptor: 0% Progesterone receptor: 0% Her 2 neu: No amplification was detected. Ki-67: 80% A breast prognostic profile will be repeated on the case and the results reported separately. Non-neoplastic breast: No significant findings. TNM: pT1c, pNX (JBK:kh 11-15-15) 2 of 4 FINAL for Brester, Zully L ([id number]) Enid Cutter MD Pathologist, Electronic  Results: IMMUNOHISTOCHEMICAL AND MORPHOMETRIC ANALYSIS PERFORMED MANUALLY Estrogen Receptor: 0%, NEGATIVE Progesterone Receptor: 0%, NEGATIVE  Results: HER2 - NEGATIVE RATIO OF HER2/CEP17 SIGNALS 1.32 AVERAGE HER2 COPY  NUMBER PER CELL 3.10   RADIOGRAPHIC STUDIES: I have personally reviewed the radiological images as listed and agreed with the findings in the report. Dg Chest Port 1 View  Result Date: 12/27/2015 CLINICAL DATA:  Right-sided Port-A-Cath placement. EXAM: PORTABLE CHEST 1 VIEW COMPARISON:  None. FINDINGS: A right-sided  Port-A-Cath is now in place. The catheter is accessed. The catheter crosses over the clavicle to the right neck. The tip of the catheter is above the clavicle. This may be within the internal jugular vein. Lung volumes are low. The heart is enlarged. Mild edema is present. Left basilar airspace opacities are present. Atherosclerotic changes are present at the aortic arch. The visualized soft tissues and bony thorax are unremarkable. IMPRESSION: 1. Interval placement of right IJ Port-A-Cath. 2. No pneumothorax. 3. The tip of the catheter is above the clavicle, presumably still within the internal jugular vein. It should not be used until further assessed. The catheter may have been pulled back by soft tissue as the patient sat more upright. These results were called by telephone at the time of interpretation on 12/27/2015 at 9:25am to Dr. Rolm Bookbinder , who verbally acknowledged these results. Electronically Signed   By: San Morelle M.D.   On: 12/27/2015 09:33   Dg Fluoro Guide Cv Line Right  Result Date: 12/30/2015 CLINICAL DATA:  Breast carcinoma.  Port catheter placement. EXAM: CHEST FLUOROSCOPY FINDINGS: Fluoroscopy was provided for the procedure without radiologist in attendance. Single spot fluoroscopic image documents right IJ port catheter, tip in the proximal SVC. No pneumothorax. FLUOROSCOPY TIME:  20 seconds IMPRESSION: 1. Right IJ port catheter placement to the proximal SVC. Electronically Signed   By: Lucrezia Europe M.D.   On: 12/30/2015 11:08   Dg Fluoro Guide Cv Line-no Report  Result Date: 12/30/2015 CLINICAL DATA:  Breast carcinoma.  Port catheter placement. EXAM: CHEST FLUOROSCOPY FINDINGS: Fluoroscopy was provided for the procedure without radiologist in attendance. Single spot fluoroscopic image documents right IJ port catheter, tip in the proximal SVC. No pneumothorax. FLUOROSCOPY TIME:  20 seconds IMPRESSION: 1. Right IJ port catheter placement to the proximal SVC. Electronically  Signed   By: Lucrezia Europe M.D.   On: 12/30/2015 11:08    ASSESSMENT & PLAN:  82 year old female, with past medical history of MGUS, hypertension, arthritis, with good performance status, was found to have stage I triple negative left breast cancer by screening mammogram.  1. Malignant or new present of upper outer quadrant of left female breast, invasive ductal carcinoma, grade 3, pT1cN0M0, stage IA, ER-/PR-/HER2- --I discussed her breast mammogram, ultrasound, biopsy and final surgical path result in details -She had sentinel lymph node biopsy, however it showed normal breast parenchyma, no lymphoid tissue, her pathological node status is unknown, no ultrasound evidence of adenopathy. -We reviewed the aggressive nature of triple negative breast cancer. Giving her stage I disease, I think she probably has 20-30% risk of distant recurrence in the next 5-10 years.  -I recommend her weekly Taxol for 12 weeks as adjuvant chemotherapy to reduce her risk of cancer recurrence. -She has been tolerating chemo well, Taxol was changed to Abraxane from week to due to infusion reaction. She has noticed some muscular joint discomfort and cramps after infusion, possible related to Taxol. --Lab results reviewed with her, mild anemia, stable, proceed week 4 Abraxane today. .   2. MGUS -will continue follow up SPEP/IFE, and light chain levels every 3 month   3. Anemia  -she has chronic anemia, worse last year due  to her surgery  -will check her iron study and folic acid, Q25 level next time   4. Joint and muscular pain, leg cramps  -She has noticed mild and intermittent muscular pain in her hands, right big toe, and buttocks, possible related to Taxol. -I encouraged her to take the calcium and magnesium over-the-counter, drink fluids adequately. She'll also use Tylenol or ibuprofen as needed for muscle pain. -We'll continue monitoring  Plan -Lab reviewed, proceed with cycle 4 Abraxane today and continue  weekly -I'll see her back in 2 weeks   All questions were answered. The patient knows to call the clinic with any problems, questions or concerns.  I spent 25 minutes counseling the patient face to face. The total time spent in the appointment was 30 minutes and more than 50% was on counseling.     Truitt Merle, MD 01/17/2016

## 2016-01-18 ENCOUNTER — Encounter: Payer: Self-pay | Admitting: Hematology

## 2016-01-23 NOTE — Addendum Note (Signed)
Addended by: Truitt Merle on: 01/23/2016 07:38 PM   Modules accepted: Orders

## 2016-01-24 ENCOUNTER — Ambulatory Visit (HOSPITAL_BASED_OUTPATIENT_CLINIC_OR_DEPARTMENT_OTHER): Payer: Medicare Other

## 2016-01-24 ENCOUNTER — Other Ambulatory Visit (HOSPITAL_BASED_OUTPATIENT_CLINIC_OR_DEPARTMENT_OTHER): Payer: Medicare Other

## 2016-01-24 ENCOUNTER — Ambulatory Visit: Payer: Self-pay

## 2016-01-24 VITALS — BP 113/62 | HR 59 | Temp 98.3°F | Resp 17

## 2016-01-24 DIAGNOSIS — Z5111 Encounter for antineoplastic chemotherapy: Secondary | ICD-10-CM

## 2016-01-24 DIAGNOSIS — C50412 Malignant neoplasm of upper-outer quadrant of left female breast: Secondary | ICD-10-CM

## 2016-01-24 LAB — CBC WITH DIFFERENTIAL/PLATELET
BASO%: 3.4 % — AB (ref 0.0–2.0)
BASOS ABS: 0.1 10*3/uL (ref 0.0–0.1)
EOS%: 5.3 % (ref 0.0–7.0)
Eosinophils Absolute: 0.1 10*3/uL (ref 0.0–0.5)
HCT: 29.1 % — ABNORMAL LOW (ref 34.8–46.6)
HEMOGLOBIN: 9.3 g/dL — AB (ref 11.6–15.9)
LYMPH%: 42.4 % (ref 14.0–49.7)
MCH: 27.2 pg (ref 25.1–34.0)
MCHC: 32 g/dL (ref 31.5–36.0)
MCV: 85.1 fL (ref 79.5–101.0)
MONO#: 0.2 10*3/uL (ref 0.1–0.9)
MONO%: 6.4 % (ref 0.0–14.0)
NEUT#: 1.1 10*3/uL — ABNORMAL LOW (ref 1.5–6.5)
NEUT%: 42.5 % (ref 38.4–76.8)
Platelets: 283 10*3/uL (ref 145–400)
RBC: 3.42 10*6/uL — ABNORMAL LOW (ref 3.70–5.45)
RDW: 15.5 % — ABNORMAL HIGH (ref 11.2–14.5)
WBC: 2.6 10*3/uL — ABNORMAL LOW (ref 3.9–10.3)
lymph#: 1.1 10*3/uL (ref 0.9–3.3)
nRBC: 0 % (ref 0–0)

## 2016-01-24 LAB — COMPREHENSIVE METABOLIC PANEL
ALBUMIN: 3.3 g/dL — AB (ref 3.5–5.0)
ALK PHOS: 64 U/L (ref 40–150)
ALT: 9 U/L (ref 0–55)
AST: 18 U/L (ref 5–34)
Anion Gap: 10 mEq/L (ref 3–11)
BUN: 42.5 mg/dL — AB (ref 7.0–26.0)
CHLORIDE: 109 meq/L (ref 98–109)
CO2: 24 mEq/L (ref 22–29)
Calcium: 8.8 mg/dL (ref 8.4–10.4)
Creatinine: 1.2 mg/dL — ABNORMAL HIGH (ref 0.6–1.1)
EGFR: 48 mL/min/{1.73_m2} — AB (ref >90)
GLUCOSE: 88 mg/dL (ref 70–140)
POTASSIUM: 4.1 meq/L (ref 3.5–5.1)
SODIUM: 142 meq/L (ref 136–145)
Total Bilirubin: 0.45 mg/dL (ref 0.20–1.20)
Total Protein: 6.6 g/dL (ref 6.4–8.3)

## 2016-01-24 MED ORDER — ONDANSETRON HCL 8 MG PO TABS
ORAL_TABLET | ORAL | Status: AC
  Filled 2016-01-24: qty 1

## 2016-01-24 MED ORDER — HEPARIN SOD (PORK) LOCK FLUSH 100 UNIT/ML IV SOLN
500.0000 [IU] | Freq: Once | INTRAVENOUS | Status: DC | PRN
  Filled 2016-01-24: qty 5

## 2016-01-24 MED ORDER — SODIUM CHLORIDE 0.9 % IV SOLN
Freq: Once | INTRAVENOUS | Status: AC
  Administered 2016-01-24: 13:00:00 via INTRAVENOUS

## 2016-01-24 MED ORDER — SODIUM CHLORIDE 0.9% FLUSH
10.0000 mL | INTRAVENOUS | Status: DC | PRN
  Filled 2016-01-24: qty 10

## 2016-01-24 MED ORDER — FAMOTIDINE IN NACL 20-0.9 MG/50ML-% IV SOLN
20.0000 mg | Freq: Once | INTRAVENOUS | Status: AC
  Administered 2016-01-24: 20 mg via INTRAVENOUS

## 2016-01-24 MED ORDER — ONDANSETRON HCL 8 MG PO TABS
8.0000 mg | ORAL_TABLET | Freq: Once | ORAL | Status: AC
  Administered 2016-01-24: 8 mg via ORAL

## 2016-01-24 MED ORDER — FAMOTIDINE IN NACL 20-0.9 MG/50ML-% IV SOLN
INTRAVENOUS | Status: AC
  Filled 2016-01-24: qty 50

## 2016-01-24 MED ORDER — PACLITAXEL PROTEIN-BOUND CHEMO INJECTION 100 MG
100.0000 mg/m2 | Freq: Once | INTRAVENOUS | Status: AC
  Administered 2016-01-24: 200 mg via INTRAVENOUS
  Filled 2016-01-24: qty 40

## 2016-01-24 NOTE — Progress Notes (Signed)
OK to treat despite ANC of 1.1 per Dr Burr Medico.  Order repeated & verified.  Encourage pt to drink more fluids with creat 1.2.  Reported to [employer].  Jesse Fall RN

## 2016-01-24 NOTE — Patient Instructions (Signed)
[facility] Discharge Instructions for Patients Receiving Chemotherapy  Today you received the following chemotherapy agents Abraxane To help prevent nausea and vomiting after your treatment, we encourage you to take your nausea medication as prescribed.   If you develop nausea and vomiting that is not controlled by your nausea medication, call the clinic.   BELOW ARE SYMPTOMS THAT SHOULD BE REPORTED IMMEDIATELY:  *FEVER GREATER THAN 100.5 F  *CHILLS WITH OR WITHOUT FEVER  NAUSEA AND VOMITING THAT IS NOT CONTROLLED WITH YOUR NAUSEA MEDICATION  *UNUSUAL SHORTNESS OF BREATH  *UNUSUAL BRUISING OR BLEEDING  TENDERNESS IN MOUTH AND THROAT WITH OR WITHOUT PRESENCE OF ULCERS  *URINARY PROBLEMS  *BOWEL PROBLEMS  UNUSUAL RASH Items with * indicate a potential emergency and should be followed up as soon as possible.  Feel free to call the clinic you have any questions or concerns. The clinic phone number is [phone].  Please show the CHEMO ALERT CARD at check-in to the Emergency Department and triage nurse.   

## 2016-01-31 ENCOUNTER — Other Ambulatory Visit (HOSPITAL_BASED_OUTPATIENT_CLINIC_OR_DEPARTMENT_OTHER): Payer: Medicare Other

## 2016-01-31 ENCOUNTER — Ambulatory Visit (HOSPITAL_BASED_OUTPATIENT_CLINIC_OR_DEPARTMENT_OTHER): Payer: Medicare Other | Admitting: Hematology

## 2016-01-31 ENCOUNTER — Other Ambulatory Visit: Payer: Medicare Other

## 2016-01-31 ENCOUNTER — Ambulatory Visit: Payer: Medicare Other

## 2016-01-31 ENCOUNTER — Ambulatory Visit (HOSPITAL_BASED_OUTPATIENT_CLINIC_OR_DEPARTMENT_OTHER): Payer: Medicare Other

## 2016-01-31 VITALS — BP 132/61 | HR 69 | Temp 98.0°F | Resp 17 | Ht 63.0 in | Wt 221.1 lb

## 2016-01-31 DIAGNOSIS — I10 Essential (primary) hypertension: Secondary | ICD-10-CM

## 2016-01-31 DIAGNOSIS — G629 Polyneuropathy, unspecified: Secondary | ICD-10-CM | POA: Diagnosis not present

## 2016-01-31 DIAGNOSIS — Z171 Estrogen receptor negative status [ER-]: Secondary | ICD-10-CM | POA: Diagnosis not present

## 2016-01-31 DIAGNOSIS — C50412 Malignant neoplasm of upper-outer quadrant of left female breast: Secondary | ICD-10-CM

## 2016-01-31 DIAGNOSIS — D649 Anemia, unspecified: Secondary | ICD-10-CM

## 2016-01-31 DIAGNOSIS — Z5111 Encounter for antineoplastic chemotherapy: Secondary | ICD-10-CM | POA: Diagnosis not present

## 2016-01-31 DIAGNOSIS — D472 Monoclonal gammopathy: Secondary | ICD-10-CM | POA: Diagnosis not present

## 2016-01-31 DIAGNOSIS — Z95828 Presence of other vascular implants and grafts: Secondary | ICD-10-CM

## 2016-01-31 LAB — CBC WITH DIFFERENTIAL/PLATELET
BASO%: 3.6 % — ABNORMAL HIGH (ref 0.0–2.0)
BASOS ABS: 0.1 10*3/uL (ref 0.0–0.1)
EOS ABS: 0.1 10*3/uL (ref 0.0–0.5)
EOS%: 2.8 % (ref 0.0–7.0)
HCT: 29.1 % — ABNORMAL LOW (ref 34.8–46.6)
HEMOGLOBIN: 9.1 g/dL — AB (ref 11.6–15.9)
LYMPH%: 37.3 % (ref 14.0–49.7)
MCH: 26.8 pg (ref 25.1–34.0)
MCHC: 31.3 g/dL — ABNORMAL LOW (ref 31.5–36.0)
MCV: 85.8 fL (ref 79.5–101.0)
MONO#: 0.2 10*3/uL (ref 0.1–0.9)
MONO%: 9.5 % (ref 0.0–14.0)
NEUT%: 46.8 % (ref 38.4–76.8)
NEUTROS ABS: 1.2 10*3/uL — AB (ref 1.5–6.5)
PLATELETS: 275 10*3/uL (ref 145–400)
RBC: 3.39 10*6/uL — ABNORMAL LOW (ref 3.70–5.45)
RDW: 15.5 % — ABNORMAL HIGH (ref 11.2–14.5)
WBC: 2.5 10*3/uL — AB (ref 3.9–10.3)
lymph#: 0.9 10*3/uL (ref 0.9–3.3)

## 2016-01-31 LAB — COMPREHENSIVE METABOLIC PANEL
ALBUMIN: 3.4 g/dL — AB (ref 3.5–5.0)
ALT: 10 U/L (ref 0–55)
ANION GAP: 9 meq/L (ref 3–11)
AST: 18 U/L (ref 5–34)
Alkaline Phosphatase: 58 U/L (ref 40–150)
BILIRUBIN TOTAL: 0.53 mg/dL (ref 0.20–1.20)
BUN: 29 mg/dL — ABNORMAL HIGH (ref 7.0–26.0)
CO2: 25 meq/L (ref 22–29)
CREATININE: 1 mg/dL (ref 0.6–1.1)
Calcium: 9.3 mg/dL (ref 8.4–10.4)
Chloride: 108 mEq/L (ref 98–109)
EGFR: 64 mL/min/{1.73_m2} — AB (ref >90)
GLUCOSE: 73 mg/dL (ref 70–140)
POTASSIUM: 4 meq/L (ref 3.5–5.1)
Sodium: 142 mEq/L (ref 136–145)
TOTAL PROTEIN: 6.7 g/dL (ref 6.4–8.3)

## 2016-01-31 MED ORDER — PACLITAXEL PROTEIN-BOUND CHEMO INJECTION 100 MG
100.0000 mg/m2 | Freq: Once | INTRAVENOUS | Status: AC
  Administered 2016-01-31: 200 mg via INTRAVENOUS
  Filled 2016-01-31: qty 40

## 2016-01-31 MED ORDER — ONDANSETRON HCL 8 MG PO TABS
ORAL_TABLET | ORAL | Status: AC
  Filled 2016-01-31: qty 1

## 2016-01-31 MED ORDER — FAMOTIDINE IN NACL 20-0.9 MG/50ML-% IV SOLN
INTRAVENOUS | Status: AC
  Filled 2016-01-31: qty 50

## 2016-01-31 MED ORDER — SODIUM CHLORIDE 0.9 % IV SOLN
Freq: Once | INTRAVENOUS | Status: AC
  Administered 2016-01-31: 13:00:00 via INTRAVENOUS

## 2016-01-31 MED ORDER — SODIUM CHLORIDE 0.9% FLUSH
10.0000 mL | INTRAVENOUS | Status: DC | PRN
  Administered 2016-01-31: 10 mL
  Filled 2016-01-31: qty 10

## 2016-01-31 MED ORDER — ONDANSETRON HCL 8 MG PO TABS
8.0000 mg | ORAL_TABLET | Freq: Once | ORAL | Status: AC
  Administered 2016-01-31: 8 mg via ORAL

## 2016-01-31 MED ORDER — SODIUM CHLORIDE 0.9 % IJ SOLN
10.0000 mL | INTRAMUSCULAR | Status: DC | PRN
  Administered 2016-01-31: 10 mL via INTRAVENOUS
  Filled 2016-01-31: qty 10

## 2016-01-31 MED ORDER — HEPARIN SOD (PORK) LOCK FLUSH 100 UNIT/ML IV SOLN
500.0000 [IU] | Freq: Once | INTRAVENOUS | Status: AC | PRN
  Administered 2016-01-31: 500 [IU]
  Filled 2016-01-31: qty 5

## 2016-01-31 MED ORDER — FAMOTIDINE IN NACL 20-0.9 MG/50ML-% IV SOLN
20.0000 mg | Freq: Once | INTRAVENOUS | Status: AC
  Administered 2016-01-31: 20 mg via INTRAVENOUS

## 2016-01-31 NOTE — Progress Notes (Signed)
Per Dr. Burr Medico, patient ok to treat with current [place].

## 2016-01-31 NOTE — Progress Notes (Signed)
[place]  [phone] [phone]  Clinic follow up Note   Patient Care Team: [name], MD as PCP - General (Internal Medicine) Valinda Party, MD as Referring Physician (Rheumatology) 01/31/2016  CHIEF COMPLAINTS:  Follow up left breast cancer   Oncology History   Malignant neoplasm of upper outer quadrant of female breast [facility])   Staging form: Breast, AJCC 7th Edition     Pathologic stage from 11/14/2015: Stage IA (T1c, N0, cM0) - Signed by Truitt Merle, MD on 11/25/2015     Clinical stage from 11/20/2015: Stage IA (T1c, N0, M0) - Signed by Curt Bears, MD on 11/20/2015       Malignant neoplasm of upper outer quadrant of female breast ([place])   10/09/2015 Mammogram    Screening bilateral mammograms showed a possible left breast mass. Diagnostic mammogram and ultrasound showed a 7 x 7 x 7 mm mass in the left breast at 11:00 position.      10/22/2015 Receptors her2    ER negative, PR negative, HER-2 not amplified      10/22/2015 Initial Biopsy    Left breast needle core biopsy at the 1:00 position showed invasive ductal carcinoma.      10/22/2015 Initial Diagnosis    Malignant neoplasm of upper outer quadrant of female breast ([place])      11/14/2015 Surgery    Left breast lumpectomy and sentinel lymph node biopsy      11/14/2015 Pathology Results    Left breast lumpectomy showed invasive ductal carcinoma, grade 3, 1.1 cm, resection margins were negative, sentinel lymph node biopsy showed benign breast parenchyma, lymph node tissue not identified. Repeated ER PR and HER-2 were all negative.      12/20/2015 -  Chemotherapy    Weekly Taxol 80 mg/m, changed to Abraxane 80 mg/m from week 3 due to infusion reaction       HISTORY OF PRESENTING ILLNESS:  Julie [name] 82 y.o. female is here because of her recently diagnosed left breast cancer. She has been seen my partner Dr. Julien Nordmann for MGUS, and was referred to me to discuss her adjuvant  therapy for breast cancer. She is accompanied by her daughter to the clinic today.  Her breast cancer was discovered by screening mammogram. She has no palpable breast mass, skin change or nipple discharge. She denies any new constitutional symptoms. The diagnostic mammogram and ultrasound showed a 7 mm mass in the left breast, biopsy showed invasive ductal carcinoma, triple negative. She was seen by breast surgeon Dr. [name], and underwent left rest lumpectomy and sentinel lymph node biopsy on 11/14/2015.   She had a multiple orthopedic surgeries in the past, takes tramadol as needed. She lives with her daughter, pretty independent, does not exercise regularly, but it goes out for shopping, playing cards, etc.  GYN HISTORY  Menarchal: 16 LMP: 37 (hysrectomy)  Contraceptive: 2 HRT: 1 yr  G5P5: she did breast feeding to 2 children   CURRENT THERAPY: weekly Taxol '80mg'$ /m2, started on 12/20/2015, changed to Abraxane '100mg'$ /m2 weekly from week 3 due to infusion reaction   INTERIM HISTORY: Julie [name] returns for follow-up and week 6 chemotherapy. She has been tolerating chemotherapy well overall. She has been constipated, no bowel movement for the past 5 days. She notices mild rectal bleeding on toilet tissue in the past few weeks, which she contributes to her hemorrhoid and constipation. She has a mild numbness of her fingers and toes, no impact on her hand function and gait. Her  right middle toe nail is loose, she is concerned it may fall off.   MEDICAL HISTORY:  Past Medical History:  Diagnosis Date  . Anemia   . Anxiety    takes Klonopin at bedtime  . Arthritis    takes Methotrexate weekly and Prednisone  . Blood transfusion    no abnormal reaction noted  . Cataracts, bilateral    immature  . Complication of anesthesia    wakes up during surgery  . Depression   . Diabetes mellitus without complication ([place])   . Fibromyalgia    takes Lyrica daily  . GERD (gastroesophageal reflux disease)     takes Nexium daily  . Glaucoma   . Hard of hearing   . Heart murmur   . Histoplasmosis   . History of colon polyps   . History of gout   . History of hiatal hernia   . History of shingles   . Hyperlipidemia   . Hypertension    takes Amlodipine,Metoprolol,and Losartan daily  . Incontinence of bowel   . Interstitial cystitis   . Joint pain   . Nocturia   . Peripheral neuropathy ([place])   . PONV (postoperative nausea and vomiting)   . Ulcer 1970   duodenal  . Urinary frequency   . Urinary urgency   . Weakness    numbness in both hands and feet    SURGICAL HISTORY: Past Surgical History:  Procedure Laterality Date  . ABDOMINAL HYSTERECTOMY    . accupuncture  3 yrs ago  . ANKLE FUSION Left 05/10/2014   Procedure: LEFT ANKLE ARTHRODESIS ;  Surgeon: Wylene Simmer, MD;  Location: [facility];  Service: Orthopedics;  Laterality: Left;  . ANKLE SURGERY Left   . arm surgery Left    radius  . bladder tacked     . CERVICAL FUSION    . CHOLECYSTECTOMY    . COLONOSCOPY    . CYSTOCELE REPAIR    . DILATION AND CURETTAGE OF UTERUS    . ESOPHAGOGASTRODUODENOSCOPY    . PORT A CATH REVISION N/A 12/27/2015   Procedure: PORT A CATH REVISION;  Surgeon: Rolm Bookbinder, MD;  Location: [facility];  Service: General;  Laterality: N/A;  . PORTACATH PLACEMENT Right 12/27/2015   Procedure: INSERTION PORT-A-CATH WITH ULTRASOUND ;  Surgeon: Rolm Bookbinder, MD;  Location: [facility];  Service: General;  Laterality: Right;  . RADIOACTIVE SEED GUIDED MASTECTOMY WITH AXILLARY SENTINEL LYMPH NODE BIOPSY Left 11/14/2015   Procedure: RADIOACTIVE SEED GUIDED LEFT BREAST LUMPECTOMY WITH AXILLARY SENTINEL LYMPH NODE BIOPSY;  Surgeon: Rolm Bookbinder, MD;  Location: [facility];  Service: General;  Laterality: Left;  RADIOACTIVE SEED GUIDED LEFT BREAST LUMPECTOMY WITH AXILLARY SENTINEL LYMPH NODE BIOPSY  . SALPINGOOPHORECTOMY Bilateral   . TOTAL KNEE ARTHROPLASTY Right  02/28/2015   Procedure: RIGHT TOTAL KNEE ARTHROPLASTY;  Surgeon: Rod Can, MD;  Location: [facility];  Service: Orthopedics;  Laterality: Right;  . UPPER GASTROINTESTINAL ENDOSCOPY    . VAGINAL HYSTERECTOMY      SOCIAL HISTORY: Social History   Social History  . Marital status: Divorced    Spouse name: N/A  . Number of children: N/A  . Years of education: N/A   Occupational History  . Not on file.   Social History Main Topics  . Smoking status: Never Smoker  . Smokeless tobacco: Never Used  . Alcohol use No     Comment: IN [place] ONLY  . Drug use: No  . Sexual activity:  Not on file   Other Topics Concern  . Not on file   Social History Narrative  . No narrative on file    FAMILY HISTORY: Family History  Problem Relation Age of Onset  . Colon cancer Sister 51  . Esophageal cancer Brother 33  . Breast cancer Mother     ALLERGIES:  is allergic to other and aspirin.  MEDICATIONS:  Current Outpatient Prescriptions  Medication Sig Dispense Refill  . acetaminophen (TYLENOL) 500 MG tablet Take 500 mg by mouth every 6 (six) hours as needed.    [name] amLODipine (NORVASC) 10 MG tablet Take 10 mg by mouth every morning.     . B-D TB SYRINGE 1CC/25GX5/8" 25G X 5/8" 1 ML MISC USE WITH METHOTREXATE  0  . calcium-vitamin D (OSCAL WITH D) 500-200 MG-UNIT per tablet Take 1 tablet by mouth every morning.    . clonazePAM (KLONOPIN) 0.5 MG tablet Take 0.5 mg by mouth at bedtime.    [name] esomeprazole (NEXIUM) 40 MG capsule Take 40 mg by mouth daily before breakfast.      . ezetimibe (ZETIA) 10 MG tablet Take 10 mg by mouth every other day. Trial, taking every other day    . fish oil-omega-3 fatty acids 1000 MG capsule Take 1 g by mouth every morning.     . folic acid (FOLVITE) 1 MG tablet Take 2 mg by mouth every morning.     . lidocaine-prilocaine (EMLA) cream Apply to affected area once 30 g 3  . meloxicam (MOBIC) 15 MG tablet Take 15 mg by mouth daily.  11  . metFORMIN (GLUCOPHAGE)  500 MG tablet Take by mouth 2 (two) times daily with a meal.    . methotrexate (50 MG/ML) 1 g injection Inject into the skin once a week.    . metoprolol succinate (TOPROL-XL) 50 MG 24 hr tablet TAKE 1 TABLET AT BEDTIME ONCE A DAY ORALLY 90 DAYS  2  . ondansetron (ZOFRAN) 8 MG tablet Take 1 tablet (8 mg total) by mouth 2 (two) times daily as needed (Nausea or vomiting). 30 tablet 1  . pantoprazole (PROTONIX) 40 MG tablet Take 1 tablet (40 mg total) by mouth daily. (Patient not taking: Reported on 01/17/2016) 30 tablet 0  . pregabalin (LYRICA) 75 MG capsule Take 75 mg by mouth 2 (two) times daily.    . prochlorperazine (COMPAZINE) 10 MG tablet Take 1 tablet (10 mg total) by mouth every 6 (six) hours as needed (Nausea or vomiting). 30 tablet 1  . traMADol (ULTRAM) 50 MG tablet Take by mouth every 6 (six) hours as needed. Reported on 11/25/2015    . valsartan-hydrochlorothiazide (DIOVAN-HCT) 320-12.5 MG per tablet Take 1 tablet by mouth daily.    . vitamin E 400 UNIT capsule Take 400 Units by mouth daily.     No current facility-administered medications for this visit.     REVIEW OF SYSTEMS:   Constitutional: Denies fevers, chills or abnormal night sweats Eyes: Denies blurriness of vision, double vision or watery eyes Ears, nose, mouth, throat, and face: Denies mucositis or sore throat Respiratory: Denies cough, dyspnea or wheezes Cardiovascular: Denies palpitation, chest discomfort or lower extremity swelling Gastrointestinal:  Denies nausea, heartburn or change in bowel habits Skin: Denies abnormal skin rashes Lymphatics: Denies new lymphadenopathy or easy bruising Neurological:Denies numbness, tingling or new weaknesses Behavioral/Psych: Mood is stable, no new changes  All other systems were reviewed with the patient and are negative.  PHYSICAL EXAMINATION: ECOG PERFORMANCE STATUS: 1 - Symptomatic  but completely ambulatory  Vitals:   01/31/16 1130  BP: 132/61  Pulse: 69  Resp: 17    Temp: 98 F (36.7 C)   Filed Weights   01/31/16 1130  Weight: 221 lb 1.6 oz (100.3 kg)    GENERAL:alert, no distress and comfortable SKIN: skin color, texture, turgor are normal, no rashes or significant lesions. Her right 3rd toe nail is loose, no significant other nail changes  EYES: normal, conjunctiva are pink and non-injected, sclera clear OROPHARYNX:no exudate, no erythema and lips, buccal mucosa, and tongue normal  NECK: supple, thyroid normal size, non-tender, without nodularity LYMPH:  no palpable lymphadenopathy in the cervical, axillary or inguinal LUNGS: clear to auscultation and percussion with normal breathing effort HEART: regular rate & rhythm and no murmurs and no lower extremity edema ABDOMEN:abdomen soft, non-tender and normal bowel sounds Musculoskeletal:no cyanosis of digits and no clubbing  PSYCH: alert & oriented x 3 with fluent speech NEURO: no focal motor/sensory deficits Breasts: Breast inspection showed them to be symmetrical with no nipple discharge. Surgical incisions in the left breast and axilla have healed well. No skin erythema or discharge. Mild bruise at her port site. Palpation of the breasts and axilla revealed no obvious mass that I could appreciate.   LABORATORY DATA:  I have reviewed the data as listed CBC Latest Ref Rng & Units 01/31/2016 01/24/2016 01/17/2016  WBC 3.9 - 10.3 10e3/uL 2.5(L) 2.6(L) 2.9(L)  Hemoglobin 11.6 - 15.9 g/dL 9.1(L) 9.3(L) 9.7(L)  Hematocrit 34.8 - 46.6 % 29.1(L) 29.1(L) 30.1(L)  Platelets 145 - 400 10e3/uL 275 283 252   CMP Latest Ref Rng & Units 01/31/2016 01/24/2016 01/17/2016  Glucose 70 - 140 mg/dl 73 88 83  BUN 7.0 - 26.0 mg/dL 29.0(H) 42.5(H) 21.5  Creatinine 0.6 - 1.1 mg/dL 1.0 1.2(H) 1.0  Sodium 136 - 145 mEq/L 142 142 142  Potassium 3.5 - 5.1 mEq/L 4.0 4.1 4.4  Chloride 101 - 111 mmol/L - - -  CO2 22 - 29 mEq/L '25 24 26  '$ Calcium 8.4 - 10.4 mg/dL 9.3 8.8 9.3  Total Protein 6.4 - 8.3 g/dL 6.7 6.6 6.6  Total  Bilirubin 0.20 - 1.20 mg/dL 0.53 0.45 0.48  Alkaline Phos 40 - 150 U/L 58 64 69  AST 5 - 34 U/L '18 18 21  '$ ALT 0 - 55 U/L '10 9 12   '$ ANC 1.2   PATHOLOGY REPORT  Diagnosis 11/14/2015 1. Breast, lumpectomy, Left - INVASIVE DUCTAL CARCINOMA, GRADE III/III, SPANNING 1.1 CM. - THE RESECTION MARGINS ARE NEGATIVE FOR CARCINOMA. - SEE ONCOLOGY TABLE BELOW. 2. Lymph node, sentinel, biopsy, Left Axillary - BENIGN BREAST PARENCHYMA. - LYMPH NODAL TISSUE IS NOT IDENTIFIED. - THERE IS NO EVIDENCE OF MALIGNANCY. Microscopic Comment 1. BREAST, INVASIVE TUMOR, WITH LYMPH NODES PRESENT Specimen, including laterality and lymph node sampling (sentinel, non-sentinel): Left breast Procedure: Seed localized lumpectomy Histologic type: Ductal Grade: III Tubule formation: 2 Nuclear pleomorphism: 3 Mitotic: 3 Tumor size (gross measurement): 1.1 cm Margins: Greater than 0.2 cm to all margins Lymphovascular invasion: Not identified Ductal carcinoma in situ: Not identified. Lobular neoplasia: Not identified. Tumor focality: Unifocal Treatment effect: N/A Extent of tumor: Confined to breast parenchyma Lymph nodes: None examined. Breast prognostic profile: Case [id number] Estrogen receptor: 0% Progesterone receptor: 0% Her 2 neu: No amplification was detected. Ki-67: 80% A breast prognostic profile will be repeated on the case and the results reported separately. Non-neoplastic breast: No significant findings. TNM: pT1c, pNX (JBK:kh 11-15-15) 2 of 4 FINAL for Bouwens,  Madison L ([id number]) Enid Cutter MD Pathologist, Electronic  Results: IMMUNOHISTOCHEMICAL AND MORPHOMETRIC ANALYSIS PERFORMED MANUALLY Estrogen Receptor: 0%, NEGATIVE Progesterone Receptor: 0%, NEGATIVE  Results: HER2 - NEGATIVE RATIO OF HER2/CEP17 SIGNALS 1.32 AVERAGE HER2 COPY NUMBER PER CELL 3.10   RADIOGRAPHIC STUDIES: I have personally reviewed the radiological images as listed and agreed with the findings in the  report. No results found.  ASSESSMENT & PLAN:  82 year old female, with past medical history of MGUS, hypertension, arthritis, with good performance status, was found to have stage I triple negative left breast cancer by screening mammogram.  1. Malignant or new present of upper outer quadrant of left female breast, invasive ductal carcinoma, grade 3, pT1cN0M0, stage IA, ER-/PR-/HER2- --I discussed her breast mammogram, ultrasound, biopsy and final surgical path result in details -She had sentinel lymph node biopsy, however it showed normal breast parenchyma, no lymphoid tissue, her pathological node status is unknown, no ultrasound evidence of adenopathy. -We reviewed the aggressive nature of triple negative breast cancer. Giving her stage I disease, I think she probably has 20-30% risk of distant recurrence in the next 5-10 years.  -I recommend her weekly Taxol for 12 weeks as adjuvant chemotherapy to reduce her risk of cancer recurrence. -She has been tolerating chemo well, Taxol was changed to Abraxane from week 3 due to infusion reaction. . --Lab results reviewed with her, mild anemia and neutripenia, stable, proceed week 6 Abraxane today.  -Neutropenic precaution reviewed with her  2. MGUS -will continue follow up SPEP/IFE, and light chain levels every 3 month   3. Anemia  -she has chronic anemia, worse last year due to her surgery  -will check her iron study and folic acid, K35 level next time   4. Joint and muscular pain, leg cramps  -She has noticed mild and intermittent muscular pain in her hands, right big toe, and buttocks, possible related to Taxol. -I encouraged her to take the calcium and magnesium over-the-counter, drink fluids adequately. She'll also use Tylenol or ibuprofen as needed for muscle pain. -We'll continue monitoring  5. Mild peripheral neuropathy -No impact on her function, we'll continue monitor closely  Plan -Lab reviewed, proceed with cycle 6 Abraxane  today and continue weekly -I'll see her back in 2 weeks, may need to see her weekly after next visit for close monitor her neuropathy    All questions were answered. The patient knows to call the clinic with any problems, questions or concerns.  I spent 20 minutes counseling the patient face to face. The total time spent in the appointment was 25 minutes and more than 50% was on counseling.     Truitt Merle, MD 01/31/2016

## 2016-01-31 NOTE — Patient Instructions (Signed)
[facility] Discharge Instructions for Patients Receiving Chemotherapy  Today you received the following chemotherapy agents: Abraxane   To help prevent nausea and vomiting after your treatment, we encourage you to take your nausea medication as directed.    If you develop nausea and vomiting that is not controlled by your nausea medication, call the clinic.   BELOW ARE SYMPTOMS THAT SHOULD BE REPORTED IMMEDIATELY:  *FEVER GREATER THAN 100.5 F  *CHILLS WITH OR WITHOUT FEVER  NAUSEA AND VOMITING THAT IS NOT CONTROLLED WITH YOUR NAUSEA MEDICATION  *UNUSUAL SHORTNESS OF BREATH  *UNUSUAL BRUISING OR BLEEDING  TENDERNESS IN MOUTH AND THROAT WITH OR WITHOUT PRESENCE OF ULCERS  *URINARY PROBLEMS  *BOWEL PROBLEMS  UNUSUAL RASH Items with * indicate a potential emergency and should be followed up as soon as possible.  Feel free to call the clinic you have any questions or concerns. The clinic phone number is [phone].  Please show the CHEMO ALERT CARD at check-in to the Emergency Department and triage nurse.   

## 2016-02-02 ENCOUNTER — Encounter: Payer: Self-pay | Admitting: Hematology

## 2016-02-03 ENCOUNTER — Ambulatory Visit: Payer: Self-pay | Admitting: Hematology

## 2016-02-05 DIAGNOSIS — Z23 Encounter for immunization: Secondary | ICD-10-CM | POA: Diagnosis not present

## 2016-02-07 ENCOUNTER — Ambulatory Visit: Payer: Self-pay

## 2016-02-07 ENCOUNTER — Other Ambulatory Visit: Payer: Medicare Other

## 2016-02-07 ENCOUNTER — Ambulatory Visit (HOSPITAL_BASED_OUTPATIENT_CLINIC_OR_DEPARTMENT_OTHER): Payer: Medicare Other

## 2016-02-07 ENCOUNTER — Encounter: Payer: Self-pay | Admitting: *Deleted

## 2016-02-07 ENCOUNTER — Other Ambulatory Visit (HOSPITAL_BASED_OUTPATIENT_CLINIC_OR_DEPARTMENT_OTHER): Payer: Medicare Other

## 2016-02-07 DIAGNOSIS — Z5111 Encounter for antineoplastic chemotherapy: Secondary | ICD-10-CM | POA: Diagnosis not present

## 2016-02-07 DIAGNOSIS — C50412 Malignant neoplasm of upper-outer quadrant of left female breast: Secondary | ICD-10-CM

## 2016-02-07 LAB — COMPREHENSIVE METABOLIC PANEL
ALT: 11 U/L (ref 0–55)
AST: 18 U/L (ref 5–34)
Albumin: 3.4 g/dL — ABNORMAL LOW (ref 3.5–5.0)
Alkaline Phosphatase: 66 U/L (ref 40–150)
Anion Gap: 8 mEq/L (ref 3–11)
BILIRUBIN TOTAL: 0.55 mg/dL (ref 0.20–1.20)
BUN: 24.6 mg/dL (ref 7.0–26.0)
CO2: 25 meq/L (ref 22–29)
CREATININE: 1 mg/dL (ref 0.6–1.1)
Calcium: 9.2 mg/dL (ref 8.4–10.4)
Chloride: 109 mEq/L (ref 98–109)
EGFR: 63 mL/min/{1.73_m2} — ABNORMAL LOW (ref >90)
GLUCOSE: 77 mg/dL (ref 70–140)
Potassium: 4.2 mEq/L (ref 3.5–5.1)
SODIUM: 142 meq/L (ref 136–145)
TOTAL PROTEIN: 6.6 g/dL (ref 6.4–8.3)

## 2016-02-07 LAB — CBC WITH DIFFERENTIAL/PLATELET
BASO%: 2 % (ref 0.0–2.0)
BASOS ABS: 0.1 10*3/uL (ref 0.0–0.1)
EOS ABS: 0.1 10*3/uL (ref 0.0–0.5)
EOS%: 2.4 % (ref 0.0–7.0)
HCT: 28.3 % — ABNORMAL LOW (ref 34.8–46.6)
HGB: 9 g/dL — ABNORMAL LOW (ref 11.6–15.9)
LYMPH%: 39.6 % (ref 14.0–49.7)
MCH: 27 pg (ref 25.1–34.0)
MCHC: 31.8 g/dL (ref 31.5–36.0)
MCV: 85 fL (ref 79.5–101.0)
MONO#: 0.2 10*3/uL (ref 0.1–0.9)
MONO%: 7.2 % (ref 0.0–14.0)
NEUT%: 48.8 % (ref 38.4–76.8)
NEUTROS ABS: 1.4 10*3/uL — AB (ref 1.5–6.5)
NRBC: 0 % (ref 0–0)
Platelets: 284 10*3/uL (ref 145–400)
RBC: 3.33 10*6/uL — AB (ref 3.70–5.45)
RDW: 16 % — ABNORMAL HIGH (ref 11.2–14.5)
WBC: 2.9 10*3/uL — AB (ref 3.9–10.3)
lymph#: 1.2 10*3/uL (ref 0.9–3.3)

## 2016-02-07 MED ORDER — PACLITAXEL PROTEIN-BOUND CHEMO INJECTION 100 MG
100.0000 mg/m2 | Freq: Once | INTRAVENOUS | Status: AC
  Administered 2016-02-07: 200 mg via INTRAVENOUS
  Filled 2016-02-07: qty 40

## 2016-02-07 MED ORDER — FAMOTIDINE IN NACL 20-0.9 MG/50ML-% IV SOLN
INTRAVENOUS | Status: AC
  Filled 2016-02-07: qty 50

## 2016-02-07 MED ORDER — SODIUM CHLORIDE 0.9 % IV SOLN
Freq: Once | INTRAVENOUS | Status: AC
  Administered 2016-02-07: 12:00:00 via INTRAVENOUS

## 2016-02-07 MED ORDER — HEPARIN SOD (PORK) LOCK FLUSH 100 UNIT/ML IV SOLN
500.0000 [IU] | Freq: Once | INTRAVENOUS | Status: AC | PRN
Start: 2016-02-07 — End: 2016-02-07
  Administered 2016-02-07: 500 [IU]
  Filled 2016-02-07: qty 5

## 2016-02-07 MED ORDER — ONDANSETRON HCL 8 MG PO TABS
8.0000 mg | ORAL_TABLET | Freq: Once | ORAL | Status: AC
  Administered 2016-02-07: 8 mg via ORAL

## 2016-02-07 MED ORDER — FAMOTIDINE IN NACL 20-0.9 MG/50ML-% IV SOLN
20.0000 mg | Freq: Once | INTRAVENOUS | Status: AC
  Administered 2016-02-07: 20 mg via INTRAVENOUS

## 2016-02-07 MED ORDER — SODIUM CHLORIDE 0.9% FLUSH
10.0000 mL | INTRAVENOUS | Status: DC | PRN
  Administered 2016-02-07: 10 mL
  Filled 2016-02-07: qty 10

## 2016-02-07 MED ORDER — ONDANSETRON HCL 8 MG PO TABS
ORAL_TABLET | ORAL | Status: AC
  Filled 2016-02-07: qty 1

## 2016-02-07 NOTE — Progress Notes (Signed)
Ok to treat with ANC of 1.4 per Dr. Burr Medico.

## 2016-02-07 NOTE — Patient Instructions (Signed)

## 2016-02-07 NOTE — Progress Notes (Signed)
OK to treat despite anc 1.4 per Dr Burr Medico.  Order repeated & verified.

## 2016-02-13 ENCOUNTER — Other Ambulatory Visit: Payer: Self-pay | Admitting: Hematology

## 2016-02-14 ENCOUNTER — Ambulatory Visit: Payer: Medicare Other

## 2016-02-14 ENCOUNTER — Telehealth: Payer: Self-pay | Admitting: Hematology

## 2016-02-14 ENCOUNTER — Other Ambulatory Visit (HOSPITAL_BASED_OUTPATIENT_CLINIC_OR_DEPARTMENT_OTHER): Payer: Medicare Other

## 2016-02-14 ENCOUNTER — Ambulatory Visit (HOSPITAL_BASED_OUTPATIENT_CLINIC_OR_DEPARTMENT_OTHER): Payer: Medicare Other | Admitting: Hematology

## 2016-02-14 ENCOUNTER — Other Ambulatory Visit: Payer: Medicare Other

## 2016-02-14 ENCOUNTER — Ambulatory Visit (HOSPITAL_BASED_OUTPATIENT_CLINIC_OR_DEPARTMENT_OTHER): Payer: Medicare Other

## 2016-02-14 ENCOUNTER — Encounter: Payer: Self-pay | Admitting: Hematology

## 2016-02-14 VITALS — BP 138/82 | HR 70 | Temp 98.1°F | Resp 18 | Ht 63.0 in | Wt 219.2 lb

## 2016-02-14 DIAGNOSIS — D649 Anemia, unspecified: Secondary | ICD-10-CM | POA: Diagnosis not present

## 2016-02-14 DIAGNOSIS — C50412 Malignant neoplasm of upper-outer quadrant of left female breast: Secondary | ICD-10-CM

## 2016-02-14 DIAGNOSIS — G629 Polyneuropathy, unspecified: Secondary | ICD-10-CM | POA: Diagnosis not present

## 2016-02-14 DIAGNOSIS — Z5111 Encounter for antineoplastic chemotherapy: Secondary | ICD-10-CM

## 2016-02-14 DIAGNOSIS — Z171 Estrogen receptor negative status [ER-]: Secondary | ICD-10-CM | POA: Diagnosis not present

## 2016-02-14 DIAGNOSIS — D472 Monoclonal gammopathy: Secondary | ICD-10-CM | POA: Diagnosis not present

## 2016-02-14 DIAGNOSIS — Z95828 Presence of other vascular implants and grafts: Secondary | ICD-10-CM

## 2016-02-14 LAB — COMPREHENSIVE METABOLIC PANEL
ALT: 10 U/L (ref 0–55)
AST: 17 U/L (ref 5–34)
Albumin: 3.3 g/dL — ABNORMAL LOW (ref 3.5–5.0)
Alkaline Phosphatase: 62 U/L (ref 40–150)
Anion Gap: 8 mEq/L (ref 3–11)
BUN: 26.8 mg/dL — AB (ref 7.0–26.0)
CHLORIDE: 110 meq/L — AB (ref 98–109)
CO2: 25 meq/L (ref 22–29)
Calcium: 9.1 mg/dL (ref 8.4–10.4)
Creatinine: 0.9 mg/dL (ref 0.6–1.1)
EGFR: 68 mL/min/{1.73_m2} — ABNORMAL LOW (ref >90)
GLUCOSE: 101 mg/dL (ref 70–140)
POTASSIUM: 3.9 meq/L (ref 3.5–5.1)
SODIUM: 143 meq/L (ref 136–145)
Total Bilirubin: 0.65 mg/dL (ref 0.20–1.20)
Total Protein: 6.6 g/dL (ref 6.4–8.3)

## 2016-02-14 LAB — CBC WITH DIFFERENTIAL/PLATELET
BASO%: 2.2 % — AB (ref 0.0–2.0)
Basophils Absolute: 0.1 10*3/uL (ref 0.0–0.1)
EOS ABS: 0.1 10*3/uL (ref 0.0–0.5)
EOS%: 1.6 % (ref 0.0–7.0)
HCT: 28.9 % — ABNORMAL LOW (ref 34.8–46.6)
HGB: 9.3 g/dL — ABNORMAL LOW (ref 11.6–15.9)
LYMPH%: 27.3 % (ref 14.0–49.7)
MCH: 27.2 pg (ref 25.1–34.0)
MCHC: 32 g/dL (ref 31.5–36.0)
MCV: 84.8 fL (ref 79.5–101.0)
MONO#: 0.3 10*3/uL (ref 0.1–0.9)
MONO%: 7.4 % (ref 0.0–14.0)
NEUT%: 61.5 % (ref 38.4–76.8)
NEUTROS ABS: 2.1 10*3/uL (ref 1.5–6.5)
PLATELETS: 269 10*3/uL (ref 145–400)
RBC: 3.41 10*6/uL — AB (ref 3.70–5.45)
RDW: 15.6 % — ABNORMAL HIGH (ref 11.2–14.5)
WBC: 3.4 10*3/uL — AB (ref 3.9–10.3)
lymph#: 0.9 10*3/uL (ref 0.9–3.3)

## 2016-02-14 MED ORDER — SODIUM CHLORIDE 0.9% FLUSH
10.0000 mL | INTRAVENOUS | Status: DC | PRN
  Administered 2016-02-14: 10 mL
  Filled 2016-02-14: qty 10

## 2016-02-14 MED ORDER — SODIUM CHLORIDE 0.9 % IV SOLN
Freq: Once | INTRAVENOUS | Status: AC
  Administered 2016-02-14: 13:00:00 via INTRAVENOUS

## 2016-02-14 MED ORDER — ONDANSETRON HCL 8 MG PO TABS
8.0000 mg | ORAL_TABLET | Freq: Once | ORAL | Status: AC
  Administered 2016-02-14: 8 mg via ORAL

## 2016-02-14 MED ORDER — SODIUM CHLORIDE 0.9 % IJ SOLN
10.0000 mL | INTRAMUSCULAR | Status: DC | PRN
  Administered 2016-02-14: 10 mL via INTRAVENOUS
  Filled 2016-02-14: qty 10

## 2016-02-14 MED ORDER — FAMOTIDINE IN NACL 20-0.9 MG/50ML-% IV SOLN
INTRAVENOUS | Status: AC
  Filled 2016-02-14: qty 50

## 2016-02-14 MED ORDER — FAMOTIDINE IN NACL 20-0.9 MG/50ML-% IV SOLN
20.0000 mg | Freq: Once | INTRAVENOUS | Status: AC
  Administered 2016-02-14: 20 mg via INTRAVENOUS

## 2016-02-14 MED ORDER — HEPARIN SOD (PORK) LOCK FLUSH 100 UNIT/ML IV SOLN
500.0000 [IU] | Freq: Once | INTRAVENOUS | Status: AC | PRN
  Administered 2016-02-14: 500 [IU]
  Filled 2016-02-14: qty 5

## 2016-02-14 MED ORDER — ONDANSETRON HCL 8 MG PO TABS
ORAL_TABLET | ORAL | Status: AC
  Filled 2016-02-14: qty 1

## 2016-02-14 MED ORDER — PACLITAXEL PROTEIN-BOUND CHEMO INJECTION 100 MG
100.0000 mg/m2 | Freq: Once | INTRAVENOUS | Status: AC
  Administered 2016-02-14: 200 mg via INTRAVENOUS
  Filled 2016-02-14: qty 40

## 2016-02-14 NOTE — Patient Instructions (Signed)
[facility] Discharge Instructions for Patients Receiving Chemotherapy  Today you received the following chemotherapy agents: Abraxane   To help prevent nausea and vomiting after your treatment, we encourage you to take your nausea medication as directed.    If you develop nausea and vomiting that is not controlled by your nausea medication, call the clinic.   BELOW ARE SYMPTOMS THAT SHOULD BE REPORTED IMMEDIATELY:  *FEVER GREATER THAN 100.5 F  *CHILLS WITH OR WITHOUT FEVER  NAUSEA AND VOMITING THAT IS NOT CONTROLLED WITH YOUR NAUSEA MEDICATION  *UNUSUAL SHORTNESS OF BREATH  *UNUSUAL BRUISING OR BLEEDING  TENDERNESS IN MOUTH AND THROAT WITH OR WITHOUT PRESENCE OF ULCERS  *URINARY PROBLEMS  *BOWEL PROBLEMS  UNUSUAL RASH Items with * indicate a potential emergency and should be followed up as soon as possible.  Feel free to call the clinic you have any questions or concerns. The clinic phone number is [phone].  Please show the CHEMO ALERT CARD at check-in to the Emergency Department and triage nurse.   

## 2016-02-14 NOTE — Progress Notes (Signed)
[place]  Telephone:([phone]) [phone] [phone]) [phone]  Clinic follow up Note   Patient Care Team: [name], MD as PCP - General (Internal Medicine) Valinda Party, MD as Referring Physician (Rheumatology) 02/14/2016  CHIEF COMPLAINTS:  Follow up left breast cancer   Oncology History   Malignant neoplasm of upper outer quadrant of female breast [facility])   Staging form: Breast, AJCC 7th Edition     Pathologic stage from 11/14/2015: Stage IA (T1c, N0, cM0) - Signed by Truitt Merle, MD on 11/25/2015     Clinical stage from 11/20/2015: Stage IA (T1c, N0, M0) - Signed by Curt Bears, MD on 11/20/2015       Malignant neoplasm of upper outer quadrant of female breast ([place])   10/09/2015 Mammogram    Screening bilateral mammograms showed a possible left breast mass. Diagnostic mammogram and ultrasound showed a 7 x 7 x 7 mm mass in the left breast at 11:00 position.      10/22/2015 Receptors her2    ER negative, PR negative, HER-2 not amplified      10/22/2015 Initial Biopsy    Left breast needle core biopsy at the 1:00 position showed invasive ductal carcinoma.      10/22/2015 Initial Diagnosis    Malignant neoplasm of upper outer quadrant of female breast ([place])      11/14/2015 Surgery    Left breast lumpectomy and sentinel lymph node biopsy      11/14/2015 Pathology Results    Left breast lumpectomy showed invasive ductal carcinoma, grade 3, 1.1 cm, resection margins were negative, sentinel lymph node biopsy showed benign breast parenchyma, lymph node tissue not identified. Repeated ER PR and HER-2 were all negative.      12/20/2015 -  Chemotherapy    Weekly Taxol 80 mg/m, changed to Abraxane 80 mg/m from week 3 due to infusion reaction       HISTORY OF PRESENTING ILLNESS:  Julie [name] 82 y.o. female is here because of her recently diagnosed left breast cancer. She has been seen my partner Dr. Julien Nordmann for MGUS, and was referred to me to discuss her adjuvant  therapy for breast cancer. She is accompanied by her daughter to the clinic today.  Her breast cancer was discovered by screening mammogram. She has no palpable breast mass, skin change or nipple discharge. She denies any new constitutional symptoms. The diagnostic mammogram and ultrasound showed a 7 mm mass in the left breast, biopsy showed invasive ductal carcinoma, triple negative. She was seen by breast surgeon Dr. [name], and underwent left rest lumpectomy and sentinel lymph node biopsy on 11/14/2015.   She had a multiple orthopedic surgeries in the past, takes tramadol as needed. She lives with her daughter, pretty independent, does not exercise regularly, but it goes out for shopping, playing cards, etc.  GYN HISTORY  Menarchal: 16 LMP: 55 (hysrectomy)  Contraceptive: 2 HRT: 1 yr  G5P5: she did breast feeding to 2 children   CURRENT THERAPY: weekly Taxol 88m/m2, started on 12/20/2015, changed to Abraxane 1053mm2 weekly from week 3 due to infusion reaction   INTERIM HISTORY: NoLyriseturns for follow-up and week 8 chemotherapy. She has been tolerating chemotherapy well overall, her main complaint is fatigue, she is able to function well at home, but feels exhausted sometime. She takes map once a walker. She also has mild numbness and tingling of her fingers, hand function is normal. No other new complaints.  MEDICAL HISTORY:  Past Medical History:  Diagnosis Date  .  Anemia   . Anxiety    takes Klonopin at bedtime  . Arthritis    takes Methotrexate weekly and Prednisone  . Blood transfusion    no abnormal reaction noted  . Cataracts, bilateral    immature  . Complication of anesthesia    wakes up during surgery  . Depression   . Diabetes mellitus without complication ([place])   . Fibromyalgia    takes Lyrica daily  . GERD (gastroesophageal reflux disease)    takes Nexium daily  . Glaucoma   . Hard of hearing   . Heart murmur   . Histoplasmosis   . History of colon  polyps   . History of gout   . History of hiatal hernia   . History of shingles   . Hyperlipidemia   . Hypertension    takes Amlodipine,Metoprolol,and Losartan daily  . Incontinence of bowel   . Interstitial cystitis   . Joint pain   . Nocturia   . Peripheral neuropathy ([place])   . PONV (postoperative nausea and vomiting)   . Ulcer 1970   duodenal  . Urinary frequency   . Urinary urgency   . Weakness    numbness in both hands and feet    SURGICAL HISTORY: Past Surgical History:  Procedure Laterality Date  . ABDOMINAL HYSTERECTOMY    . accupuncture  3 yrs ago  . ANKLE FUSION Left 05/10/2014   Procedure: LEFT ANKLE ARTHRODESIS ;  Surgeon: Wylene Simmer, MD;  Location: [place];  Service: Orthopedics;  Laterality: Left;  . ANKLE SURGERY Left   . arm surgery Left    radius  . bladder tacked     . CERVICAL FUSION    . CHOLECYSTECTOMY    . COLONOSCOPY    . CYSTOCELE REPAIR    . DILATION AND CURETTAGE OF UTERUS    . ESOPHAGOGASTRODUODENOSCOPY    . PORT A CATH REVISION N/A 12/27/2015   Procedure: PORT A CATH REVISION;  Surgeon: Rolm Bookbinder, MD;  Location: [place];  Service: General;  Laterality: N/A;  . PORTACATH PLACEMENT Right 12/27/2015   Procedure: INSERTION PORT-A-CATH WITH ULTRASOUND ;  Surgeon: Rolm Bookbinder, MD;  Location: [place];  Service: General;  Laterality: Right;  . RADIOACTIVE SEED GUIDED MASTECTOMY WITH AXILLARY SENTINEL LYMPH NODE BIOPSY Left 11/14/2015   Procedure: RADIOACTIVE SEED GUIDED LEFT BREAST LUMPECTOMY WITH AXILLARY SENTINEL LYMPH NODE BIOPSY;  Surgeon: Rolm Bookbinder, MD;  Location: [place];  Service: General;  Laterality: Left;  RADIOACTIVE SEED GUIDED LEFT BREAST LUMPECTOMY WITH AXILLARY SENTINEL LYMPH NODE BIOPSY  . SALPINGOOPHORECTOMY Bilateral   . TOTAL KNEE ARTHROPLASTY Right 02/28/2015   Procedure: RIGHT TOTAL KNEE ARTHROPLASTY;  Surgeon: Rod Can, MD;  Location: [facility];  Service:  Orthopedics;  Laterality: Right;  . UPPER GASTROINTESTINAL ENDOSCOPY    . VAGINAL HYSTERECTOMY      SOCIAL HISTORY: Social History   Social History  . Marital status: Divorced    Spouse name: N/A  . Number of children: N/A  . Years of education: N/A   Occupational History  . Not on file.   Social History Main Topics  . Smoking status: Never Smoker  . Smokeless tobacco: Never Used  . Alcohol use No     Comment: IN [place] ONLY  . Drug use: No  . Sexual activity: Not on file   Other Topics Concern  . Not on file   Social History Narrative  . No narrative on file  FAMILY HISTORY: Family History  Problem Relation Age of Onset  . Colon cancer Sister 53  . Esophageal cancer Brother 33  . Breast cancer Mother     ALLERGIES:  is allergic to other and aspirin.  MEDICATIONS:  Current Outpatient Prescriptions  Medication Sig Dispense Refill  . acetaminophen (TYLENOL) 500 MG tablet Take 500 mg by mouth every 6 (six) hours as needed.    [name] amLODipine (NORVASC) 10 MG tablet Take 10 mg by mouth every morning.     . B-D TB SYRINGE 1CC/25GX5/8" 25G X 5/8" 1 ML MISC USE WITH METHOTREXATE  0  . calcium-vitamin D (OSCAL WITH D) 500-200 MG-UNIT per tablet Take 1 tablet by mouth every morning.    . clonazePAM (KLONOPIN) 0.5 MG tablet Take 0.5 mg by mouth at bedtime.    [name] esomeprazole (NEXIUM) 40 MG capsule Take 40 mg by mouth daily before breakfast.      . ezetimibe (ZETIA) 10 MG tablet Take 10 mg by mouth every other day. Trial, taking every other day    . fish oil-omega-3 fatty acids 1000 MG capsule Take 1 g by mouth every morning.     . folic acid (FOLVITE) 1 MG tablet Take 2 mg by mouth every morning.     . lidocaine-prilocaine (EMLA) cream Apply to affected area once 30 g 3  . meloxicam (MOBIC) 15 MG tablet Take 15 mg by mouth daily.  11  . metFORMIN (GLUCOPHAGE) 500 MG tablet Take by mouth 2 (two) times daily with a meal.    . methotrexate (50 MG/ML) 1 g injection Inject into  the skin once a week.    . metoprolol succinate (TOPROL-XL) 50 MG 24 hr tablet TAKE 1 TABLET AT BEDTIME ONCE A DAY ORALLY 90 DAYS  2  . ondansetron (ZOFRAN) 8 MG tablet Take 1 tablet (8 mg total) by mouth 2 (two) times daily as needed (Nausea or vomiting). 30 tablet 1  . pantoprazole (PROTONIX) 40 MG tablet TAKE 1 TABLET (40 MG TOTAL) BY MOUTH DAILY. 30 tablet 0  . pregabalin (LYRICA) 75 MG capsule Take 75 mg by mouth 2 (two) times daily.    . prochlorperazine (COMPAZINE) 10 MG tablet Take 1 tablet (10 mg total) by mouth every 6 (six) hours as needed (Nausea or vomiting). 30 tablet 1  . traMADol (ULTRAM) 50 MG tablet Take by mouth every 6 (six) hours as needed. Reported on 11/25/2015    . valsartan-hydrochlorothiazide (DIOVAN-HCT) 320-12.5 MG per tablet Take 1 tablet by mouth daily.    . vitamin E 400 UNIT capsule Take 400 Units by mouth daily.     No current facility-administered medications for this visit.     REVIEW OF SYSTEMS:   Constitutional: Denies fevers, chills or abnormal night sweats Eyes: Denies blurriness of vision, double vision or watery eyes Ears, nose, mouth, throat, and face: Denies mucositis or sore throat Respiratory: Denies cough, dyspnea or wheezes Cardiovascular: Denies palpitation, chest discomfort or lower extremity swelling Gastrointestinal:  Denies nausea, heartburn or change in bowel habits Skin: Denies abnormal skin rashes Lymphatics: Denies new lymphadenopathy or easy bruising Neurological:Denies numbness, tingling or new weaknesses Behavioral/Psych: Mood is stable, no new changes  All other systems were reviewed with the patient and are negative.  PHYSICAL EXAMINATION: ECOG PERFORMANCE STATUS: 1 - Symptomatic but completely ambulatory  Vitals:   02/14/16 1158  BP: 138/82  Pulse: 70  Resp: 18  Temp: 98.1 F (36.7 C)   Filed Weights   02/14/16 1158  Weight: 219 lb 3.2 oz (99.4 kg)    GENERAL:alert, no distress and comfortable SKIN: skin color,  texture, turgor are normal, no rashes or significant lesions. Her right 3rd toe nail is loose, no significant other nail changes  EYES: normal, conjunctiva are pink and non-injected, sclera clear OROPHARYNX:no exudate, no erythema and lips, buccal mucosa, and tongue normal  NECK: supple, thyroid normal size, non-tender, without nodularity LYMPH:  no palpable lymphadenopathy in the cervical, axillary or inguinal LUNGS: clear to auscultation and percussion with normal breathing effort HEART: regular rate & rhythm and no murmurs and no lower extremity edema ABDOMEN:abdomen soft, non-tender and normal bowel sounds Musculoskeletal:no cyanosis of digits and no clubbing  PSYCH: alert & oriented x 3 with fluent speech NEURO: no focal motor/sensory deficits Breasts: Breast inspection showed them to be symmetrical with no nipple discharge. Surgical incisions in the left breast and axilla have healed well. No skin erythema or discharge. Mild bruise at her port site. Palpation of the breasts and axilla revealed no obvious mass that I could appreciate.   LABORATORY DATA:  I have reviewed the data as listed CBC Latest Ref Rng & Units 02/14/2016 02/07/2016 01/31/2016  WBC 3.9 - 10.3 10e3/uL 3.4(L) 2.9(L) 2.5(L)  Hemoglobin 11.6 - 15.9 g/dL 9.3(L) 9.0(L) 9.1(L)  Hematocrit 34.8 - 46.6 % 28.9(L) 28.3(L) 29.1(L)  Platelets 145 - 400 10e3/uL 269 284 275   CMP Latest Ref Rng & Units 02/14/2016 02/07/2016 01/31/2016  Glucose 70 - 140 mg/dl 101 77 73  BUN 7.0 - 26.0 mg/dL 26.8(H) 24.6 29.0(H)  Creatinine 0.6 - 1.1 mg/dL 0.9 1.0 1.0  Sodium 136 - 145 mEq/L 143 142 142  Potassium 3.5 - 5.1 mEq/L 3.9 4.2 4.0  Chloride 101 - 111 mmol/L - - -  CO2 22 - 29 mEq/L _0 Calcium 8.4 - 10.4 mg/dL 9.1 9.2 9.3  Total Protein 6.4 - 8.3 g/dL 6.6 6.6 6.7  Total Bilirubin 0.20 - 1.20 mg/dL 0.65 0.55 0.53  Alkaline Phos 40 - 150 U/L 62 66 58  AST 5 - 34 U/L _1 ALT 0 - 55 U/L _2 ANC 2.1 today   PATHOLOGY  REPORT  Diagnosis 11/14/2015 1. Breast, lumpectomy, Left - INVASIVE DUCTAL CARCINOMA, GRADE III/III, SPANNING 1.1 CM. - THE RESECTION MARGINS ARE NEGATIVE FOR CARCINOMA. - SEE ONCOLOGY TABLE BELOW. 2. Lymph node, sentinel, biopsy, Left Axillary - BENIGN BREAST PARENCHYMA. - LYMPH NODAL TISSUE IS NOT IDENTIFIED. - THERE IS NO EVIDENCE OF MALIGNANCY. Microscopic Comment 1. BREAST, INVASIVE TUMOR, WITH LYMPH NODES PRESENT Specimen, including laterality and lymph node sampling (sentinel, non-sentinel): Left breast Procedure: Seed localized lumpectomy Histologic type: Ductal Grade: III Tubule formation: 2 Nuclear pleomorphism: 3 Mitotic: 3 Tumor size (gross measurement): 1.1 cm Margins: Greater than 0.2 cm to all margins Lymphovascular invasion: Not identified Ductal carcinoma in situ: Not identified. Lobular neoplasia: Not identified. Tumor focality: Unifocal Treatment effect: N/A Extent of tumor: Confined to breast parenchyma Lymph nodes: None examined. Breast prognostic profile: Case [id number] Estrogen receptor: 0% Progesterone receptor: 0% Her 2 neu: No amplification was detected. Ki-67: 80% A breast prognostic profile will be repeated on the case and the results reported separately. Non-neoplastic breast: No significant findings. TNM: pT1c, pNX (JBK:kh 11-15-15) 2 of 4 FINAL for Christman, Kiyra L ([id number]) Enid Cutter MD Pathologist, Electronic  Results: IMMUNOHISTOCHEMICAL AND MORPHOMETRIC ANALYSIS PERFORMED MANUALLY Estrogen Receptor: 0%, NEGATIVE Progesterone Receptor: 0%, NEGATIVE  Results: HER2 - NEGATIVE RATIO OF HER2/CEP17 SIGNALS  1.32 AVERAGE HER2 COPY NUMBER PER CELL 3.10   RADIOGRAPHIC STUDIES: I have personally reviewed the radiological images as listed and agreed with the findings in the report. No results found.  ASSESSMENT & PLAN:  82 year old female, with past medical history of MGUS, hypertension, arthritis, with good performance status,  was found to have stage I triple negative left breast cancer by screening mammogram.  1. Malignant or new present of upper outer quadrant of left female breast, invasive ductal carcinoma, grade 3, pT1cN0M0, stage IA, ER-/PR-/HER2- --I discussed her breast mammogram, ultrasound, biopsy and final surgical path result in details -She had sentinel lymph node biopsy, however it showed normal breast parenchyma, no lymphoid tissue, her pathological node status is unknown, no ultrasound evidence of adenopathy. -We reviewed the aggressive nature of triple negative breast cancer. Giving her stage I disease, I think she probably has 20-30% risk of distant recurrence in the next 5-10 years.  -I recommend weekly Taxol for 12 weeks as adjuvant chemotherapy to reduce her risk of cancer recurrence. -She has been tolerating chemo well, Taxol was changed to Abraxane from week 3 due to infusion reaction. . --Lab results reviewed with her, mild anemia, ANC and PLT normal,  proceed week 8 Abraxane today.  -We'll watch her peripheral neuropathy closely  -Okay to give her chemotherapy break if needed.   2. MGUS -will continue follow up SPEP/IFE, and light chain levels every 3 month   3. Anemia  -she has chronic anemia, worse last year due to her surgery  -04 stable, we'll continue monitoring  4. Joint and muscular pain, leg cramps  -She has noticed mild and intermittent muscular pain in her hands, right big toe, and buttocks, possible related to Taxol. -I encouraged her to take the calcium and magnesium over-the-counter, drink fluids adequately. She'll also use Tylenol or ibuprofen as needed for muscle pain. -We'll continue monitoring  5. Mild peripheral neuropathy, G1 -No impact on her function, we'll continue monitor closely  Plan -Lab reviewed, proceed with cycle 8 Abraxane today and continue weekly -I'll see her weekly until she completes chemotherapy, and we'll check her neuropathy closely.  All  questions were answered. The patient knows to call the clinic with any problems, questions or concerns.  I spent 20 minutes counseling the patient face to face. The total time spent in the appointment was 25 minutes and more than 50% was on counseling.     Truitt Merle, MD 02/14/2016

## 2016-02-14 NOTE — Telephone Encounter (Signed)
Spoke with patient dtr re next appointment for 9/29. Dtr thought patient may still be in facility due schedule was behind. Patient done with infusion - schedule mailed.

## 2016-02-21 ENCOUNTER — Ambulatory Visit (HOSPITAL_BASED_OUTPATIENT_CLINIC_OR_DEPARTMENT_OTHER): Payer: Medicare Other | Admitting: Nurse Practitioner

## 2016-02-21 ENCOUNTER — Other Ambulatory Visit (HOSPITAL_BASED_OUTPATIENT_CLINIC_OR_DEPARTMENT_OTHER): Payer: Medicare Other

## 2016-02-21 ENCOUNTER — Ambulatory Visit: Payer: Medicare Other

## 2016-02-21 ENCOUNTER — Ambulatory Visit (HOSPITAL_BASED_OUTPATIENT_CLINIC_OR_DEPARTMENT_OTHER): Payer: Medicare Other

## 2016-02-21 ENCOUNTER — Encounter: Payer: Self-pay | Admitting: Nurse Practitioner

## 2016-02-21 VITALS — BP 145/85 | HR 69 | Temp 98.1°F | Resp 17 | Ht 63.0 in | Wt 216.7 lb

## 2016-02-21 DIAGNOSIS — C50412 Malignant neoplasm of upper-outer quadrant of left female breast: Secondary | ICD-10-CM

## 2016-02-21 DIAGNOSIS — Z95828 Presence of other vascular implants and grafts: Secondary | ICD-10-CM

## 2016-02-21 DIAGNOSIS — Z5111 Encounter for antineoplastic chemotherapy: Secondary | ICD-10-CM

## 2016-02-21 DIAGNOSIS — D472 Monoclonal gammopathy: Secondary | ICD-10-CM | POA: Diagnosis not present

## 2016-02-21 DIAGNOSIS — G620 Drug-induced polyneuropathy: Secondary | ICD-10-CM | POA: Diagnosis not present

## 2016-02-21 DIAGNOSIS — D649 Anemia, unspecified: Secondary | ICD-10-CM | POA: Diagnosis not present

## 2016-02-21 DIAGNOSIS — Z171 Estrogen receptor negative status [ER-]: Secondary | ICD-10-CM

## 2016-02-21 LAB — CBC WITH DIFFERENTIAL/PLATELET
BASO%: 2.3 % — ABNORMAL HIGH (ref 0.0–2.0)
Basophils Absolute: 0.1 10*3/uL (ref 0.0–0.1)
EOS%: 1.6 % (ref 0.0–7.0)
Eosinophils Absolute: 0.1 10*3/uL (ref 0.0–0.5)
HCT: 28.9 % — ABNORMAL LOW (ref 34.8–46.6)
HEMOGLOBIN: 9.3 g/dL — AB (ref 11.6–15.9)
LYMPH%: 29.3 % (ref 14.0–49.7)
MCH: 27.3 pg (ref 25.1–34.0)
MCHC: 32.1 g/dL (ref 31.5–36.0)
MCV: 85.1 fL (ref 79.5–101.0)
MONO#: 0.3 10*3/uL (ref 0.1–0.9)
MONO%: 8.3 % (ref 0.0–14.0)
NEUT%: 58.5 % (ref 38.4–76.8)
NEUTROS ABS: 2 10*3/uL (ref 1.5–6.5)
Platelets: 288 10*3/uL (ref 145–400)
RBC: 3.4 10*6/uL — AB (ref 3.70–5.45)
RDW: 16.6 % — AB (ref 11.2–14.5)
WBC: 3.5 10*3/uL — AB (ref 3.9–10.3)
lymph#: 1 10*3/uL (ref 0.9–3.3)

## 2016-02-21 LAB — COMPREHENSIVE METABOLIC PANEL
ALBUMIN: 3.4 g/dL — AB (ref 3.5–5.0)
ALK PHOS: 65 U/L (ref 40–150)
ALT: 9 U/L (ref 0–55)
AST: 17 U/L (ref 5–34)
Anion Gap: 10 mEq/L (ref 3–11)
BILIRUBIN TOTAL: 0.55 mg/dL (ref 0.20–1.20)
BUN: 29.7 mg/dL — AB (ref 7.0–26.0)
CO2: 24 meq/L (ref 22–29)
Calcium: 9.3 mg/dL (ref 8.4–10.4)
Chloride: 110 mEq/L — ABNORMAL HIGH (ref 98–109)
Creatinine: 1.1 mg/dL (ref 0.6–1.1)
EGFR: 55 mL/min/{1.73_m2} — AB (ref >90)
GLUCOSE: 89 mg/dL (ref 70–140)
POTASSIUM: 4 meq/L (ref 3.5–5.1)
SODIUM: 144 meq/L (ref 136–145)
TOTAL PROTEIN: 6.7 g/dL (ref 6.4–8.3)

## 2016-02-21 MED ORDER — SODIUM CHLORIDE 0.9% FLUSH
10.0000 mL | INTRAVENOUS | Status: DC | PRN
  Administered 2016-02-21: 10 mL
  Filled 2016-02-21: qty 10

## 2016-02-21 MED ORDER — SODIUM CHLORIDE 0.9 % IJ SOLN
10.0000 mL | INTRAMUSCULAR | Status: DC | PRN
  Administered 2016-02-21: 10 mL via INTRAVENOUS
  Filled 2016-02-21: qty 10

## 2016-02-21 MED ORDER — ONDANSETRON HCL 8 MG PO TABS
8.0000 mg | ORAL_TABLET | Freq: Once | ORAL | Status: AC
  Administered 2016-02-21: 8 mg via ORAL

## 2016-02-21 MED ORDER — PACLITAXEL PROTEIN-BOUND CHEMO INJECTION 100 MG
70.0000 mg/m2 | Freq: Once | INTRAVENOUS | Status: AC
  Administered 2016-02-21: 150 mg via INTRAVENOUS
  Filled 2016-02-21: qty 30

## 2016-02-21 MED ORDER — SODIUM CHLORIDE 0.9 % IV SOLN
Freq: Once | INTRAVENOUS | Status: AC
  Administered 2016-02-21: 13:00:00 via INTRAVENOUS

## 2016-02-21 MED ORDER — ONDANSETRON HCL 8 MG PO TABS
ORAL_TABLET | ORAL | Status: AC
Start: 2016-02-21 — End: 2016-02-21
  Filled 2016-02-21: qty 1

## 2016-02-21 MED ORDER — FAMOTIDINE IN NACL 20-0.9 MG/50ML-% IV SOLN
INTRAVENOUS | Status: AC
  Filled 2016-02-21: qty 50

## 2016-02-21 MED ORDER — FAMOTIDINE IN NACL 20-0.9 MG/50ML-% IV SOLN
20.0000 mg | Freq: Once | INTRAVENOUS | Status: AC
  Administered 2016-02-21: 20 mg via INTRAVENOUS

## 2016-02-21 MED ORDER — HEPARIN SOD (PORK) LOCK FLUSH 100 UNIT/ML IV SOLN
500.0000 [IU] | Freq: Once | INTRAVENOUS | Status: AC | PRN
Start: 2016-02-21 — End: 2016-02-21
  Administered 2016-02-21: 500 [IU]
  Filled 2016-02-21: qty 5

## 2016-02-21 NOTE — Patient Instructions (Signed)
[facility] Discharge Instructions for Patients Receiving Chemotherapy  Today you received the following chemotherapy agents Abraxane To help prevent nausea and vomiting after your treatment, we encourage you to take your nausea medication as prescribed.   If you develop nausea and vomiting that is not controlled by your nausea medication, call the clinic.   BELOW ARE SYMPTOMS THAT SHOULD BE REPORTED IMMEDIATELY:  *FEVER GREATER THAN 100.5 F  *CHILLS WITH OR WITHOUT FEVER  NAUSEA AND VOMITING THAT IS NOT CONTROLLED WITH YOUR NAUSEA MEDICATION  *UNUSUAL SHORTNESS OF BREATH  *UNUSUAL BRUISING OR BLEEDING  TENDERNESS IN MOUTH AND THROAT WITH OR WITHOUT PRESENCE OF ULCERS  *URINARY PROBLEMS  *BOWEL PROBLEMS  UNUSUAL RASH Items with * indicate a potential emergency and should be followed up as soon as possible.  Feel free to call the clinic you have any questions or concerns. The clinic phone number is [phone].  Please show the CHEMO ALERT CARD at check-in to the Emergency Department and triage nurse.   

## 2016-02-21 NOTE — Progress Notes (Signed)
[place] OFFICE PROGRESS NOTE   Diagnosis:  Breast cancer Oncology History   Malignant neoplasm of upper outer quadrant of female breast [facility])   Staging form: Breast, AJCC 7th Edition     Pathologic stage from 11/14/2015: Stage IA (T1c, N0, cM0) - Signed by Truitt Merle, MD on 11/25/2015     Clinical stage from 11/20/2015: Stage IA (T1c, N0, M0) - Signed by Curt Bears, MD on 11/20/2015      Malignant neoplasm of upper outer quadrant of female breast ([place])   10/09/2015 Mammogram    Screening bilateral mammograms showed a possible left breast mass. Diagnostic mammogram and ultrasound showed a 7 x 7 x 7 mm mass in the left breast at 11:00 position.     10/22/2015 Receptors her2    ER negative, PR negative, HER-2 not amplified     10/22/2015 Initial Biopsy    Left breast needle core biopsy at the 1:00 position showed invasive ductal carcinoma.     10/22/2015 Initial Diagnosis    Malignant neoplasm of upper outer quadrant of female breast ([place])     11/14/2015 Surgery    Left breast lumpectomy and sentinel lymph node biopsy     11/14/2015 Pathology Results    Left breast lumpectomy showed invasive ductal carcinoma, grade 3, 1.1 cm, resection margins were negative, sentinel lymph node biopsy showed benign breast parenchyma, lymph node tissue not identified. Repeated ER PR and HER-2 were all negative.     12/20/2015 -  Chemotherapy    Weekly Taxol 80 mg/m, changed to Abraxane 100 mg/m from week 3 due to infusion reaction     CURRENT THERAPY: weekly Taxol '80mg'$ /m2, started on 12/20/2015, changed to Abraxane '100mg'$ /m2 weekly from week 3 due to infusion reaction; Abraxane dose reduced to 70 mg/m beginning with week 9 (02/21/2016) due to progressive neuropathy symptoms.  INTERVAL HISTORY:   Julie [name] returns as scheduled. She completed cycle 8 Abraxane 02/14/2016. She has intermittent nausea. No vomiting. Compazine relieves the nausea. No mouth  sores. No diarrhea or constipation. She has persistent numbness in the fingertips and intermittent numbness in the toes. The numbness has increased since last week. The numbness does not interfere with activity. She has noted a "bump" on the second toenail left foot. She reports recent "cold" symptoms with a cough. No fever. She has occasional shortness of breath.  Objective:  Vital signs in last 24 hours:  Blood pressure (!) 145/85, pulse 69, temperature 98.1 F (36.7 C), temperature source Oral, resp. rate 17, height '5\' 3"'$  (1.6 m), weight 216 lb 11.2 oz (98.3 kg), SpO2 99 %.    HEENT: No thrush or ulcers. Resp: Lungs clear bilaterally. No wheezes or rales. Cardio: Regular rate and rhythm. GI: Abdomen soft and nontender. No hepatomegaly. Vascular: No leg edema. Neuro: Vibratory sense moderately decreased over the fingertips per tuning fork exam.  Skin: Toenail beds have a normal appearance. No erythema or drainage. Second toenail left foot has a small area of raised firmness, question nail overgrowth.    Lab Results:  Lab Results  Component Value Date   WBC 3.5 (L) 02/21/2016   HGB 9.3 (L) 02/21/2016   HCT 28.9 (L) 02/21/2016   MCV 85.1 02/21/2016   PLT 288 02/21/2016   NEUTROABS 2.0 02/21/2016    Imaging:  No results found.  Medications: I have reviewed the patient's current medications.  Assessment/Plan: 1. Upper outer quadrant of left female breast, invasive ductal carcinoma, grade 3, pT1cN0M0, stage IA, ER-/PR-/HER2-; adjuvant  chemotherapy with weekly Taxol initiated 12/20/2015. Chemotherapy changed to weekly Abraxane beginning with the third treatment due to an infusion reaction to the Taxol. 2. MGUS. SPEP/IFE and light chains every 3 months. 3. Anemia, history of chronic anemia, worse last year due to surgery. 4. Joint and muscular pain, leg cramps possibly related to Taxol. 5. Peripheral neuropathy, increased 02/21/2016. Abraxane dose reduced to 70  mg/m.   Disposition: [name] appears stable. She has completed 8 weekly chemotherapy treatments (Taxol weekly 2, changed to Abraxane with week 3). Plan to proceed with week 9 Abraxane today as scheduled.   She has progressive neuropathy symptoms. I reviewed this with Dr. Burr Medico. The Abraxane will be dose reduced to 70 mg/m beginning today.  She will return for a follow-up visit and week 10 of the chemotherapy in one week. She will contact the office in the interim with any problems.  25 minutes were spent face-to-face at today's visit with the majority of that time involved in counseling/coordination of care.    Ned Card ANP/GNP-BC   02/21/2016  11:30 AM

## 2016-02-21 NOTE — Patient Instructions (Signed)

## 2016-02-21 NOTE — Addendum Note (Signed)
Addended by: Ardeen Garland on: 02/21/2016 01:40 PM   Modules accepted: Orders

## 2016-02-28 ENCOUNTER — Ambulatory Visit: Payer: Medicare Other

## 2016-02-28 ENCOUNTER — Ambulatory Visit (HOSPITAL_BASED_OUTPATIENT_CLINIC_OR_DEPARTMENT_OTHER): Payer: Medicare Other | Admitting: Nurse Practitioner

## 2016-02-28 ENCOUNTER — Other Ambulatory Visit (HOSPITAL_BASED_OUTPATIENT_CLINIC_OR_DEPARTMENT_OTHER): Payer: Medicare Other

## 2016-02-28 ENCOUNTER — Other Ambulatory Visit: Payer: Medicare Other

## 2016-02-28 ENCOUNTER — Ambulatory Visit (HOSPITAL_BASED_OUTPATIENT_CLINIC_OR_DEPARTMENT_OTHER): Payer: Medicare Other

## 2016-02-28 VITALS — BP 158/67 | HR 65 | Temp 99.1°F | Resp 16 | Ht 63.0 in | Wt 219.8 lb

## 2016-02-28 DIAGNOSIS — C50412 Malignant neoplasm of upper-outer quadrant of left female breast: Secondary | ICD-10-CM

## 2016-02-28 DIAGNOSIS — D472 Monoclonal gammopathy: Secondary | ICD-10-CM

## 2016-02-28 DIAGNOSIS — G620 Drug-induced polyneuropathy: Secondary | ICD-10-CM

## 2016-02-28 DIAGNOSIS — Z95828 Presence of other vascular implants and grafts: Secondary | ICD-10-CM

## 2016-02-28 DIAGNOSIS — Z171 Estrogen receptor negative status [ER-]: Secondary | ICD-10-CM | POA: Diagnosis not present

## 2016-02-28 DIAGNOSIS — Z5111 Encounter for antineoplastic chemotherapy: Secondary | ICD-10-CM

## 2016-02-28 DIAGNOSIS — D649 Anemia, unspecified: Secondary | ICD-10-CM

## 2016-02-28 LAB — COMPREHENSIVE METABOLIC PANEL
ALBUMIN: 3.4 g/dL — AB (ref 3.5–5.0)
ALK PHOS: 64 U/L (ref 40–150)
ALT: <9 U/L (ref 0–55)
AST: 17 U/L (ref 5–34)
Anion Gap: 7 mEq/L (ref 3–11)
BUN: 21.7 mg/dL (ref 7.0–26.0)
CHLORIDE: 110 meq/L — AB (ref 98–109)
CO2: 25 mEq/L (ref 22–29)
Calcium: 9.1 mg/dL (ref 8.4–10.4)
Creatinine: 0.9 mg/dL (ref 0.6–1.1)
EGFR: 68 mL/min/{1.73_m2} — AB (ref >90)
GLUCOSE: 74 mg/dL (ref 70–140)
POTASSIUM: 4 meq/L (ref 3.5–5.1)
SODIUM: 142 meq/L (ref 136–145)
Total Bilirubin: 0.5 mg/dL (ref 0.20–1.20)
Total Protein: 6.4 g/dL (ref 6.4–8.3)

## 2016-02-28 LAB — CBC WITH DIFFERENTIAL/PLATELET
BASO%: 2.3 % — ABNORMAL HIGH (ref 0.0–2.0)
BASOS ABS: 0.1 10*3/uL (ref 0.0–0.1)
EOS ABS: 0.1 10*3/uL (ref 0.0–0.5)
EOS%: 2.6 % (ref 0.0–7.0)
HCT: 28.7 % — ABNORMAL LOW (ref 34.8–46.6)
HEMOGLOBIN: 9.2 g/dL — AB (ref 11.6–15.9)
LYMPH%: 31.9 % (ref 14.0–49.7)
MCH: 27.3 pg (ref 25.1–34.0)
MCHC: 32 g/dL (ref 31.5–36.0)
MCV: 85.2 fL (ref 79.5–101.0)
MONO#: 0.3 10*3/uL (ref 0.1–0.9)
MONO%: 10.2 % (ref 0.0–14.0)
NEUT#: 1.5 10*3/uL (ref 1.5–6.5)
NEUT%: 53 % (ref 38.4–76.8)
Platelets: 275 10*3/uL (ref 145–400)
RBC: 3.36 10*6/uL — AB (ref 3.70–5.45)
RDW: 17.7 % — ABNORMAL HIGH (ref 11.2–14.5)
WBC: 2.7 10*3/uL — ABNORMAL LOW (ref 3.9–10.3)
lymph#: 0.9 10*3/uL (ref 0.9–3.3)

## 2016-02-28 MED ORDER — FAMOTIDINE IN NACL 20-0.9 MG/50ML-% IV SOLN
INTRAVENOUS | Status: AC
  Filled 2016-02-28: qty 50

## 2016-02-28 MED ORDER — SODIUM CHLORIDE 0.9 % IJ SOLN
10.0000 mL | INTRAMUSCULAR | Status: DC | PRN
  Administered 2016-02-28: 10 mL via INTRAVENOUS
  Filled 2016-02-28: qty 10

## 2016-02-28 MED ORDER — FAMOTIDINE IN NACL 20-0.9 MG/50ML-% IV SOLN
20.0000 mg | Freq: Once | INTRAVENOUS | Status: AC
  Administered 2016-02-28: 20 mg via INTRAVENOUS

## 2016-02-28 MED ORDER — ONDANSETRON HCL 8 MG PO TABS
ORAL_TABLET | ORAL | Status: AC
  Filled 2016-02-28: qty 1

## 2016-02-28 MED ORDER — SODIUM CHLORIDE 0.9 % IV SOLN
Freq: Once | INTRAVENOUS | Status: AC
  Administered 2016-02-28: 13:00:00 via INTRAVENOUS

## 2016-02-28 MED ORDER — SODIUM CHLORIDE 0.9% FLUSH
10.0000 mL | INTRAVENOUS | Status: DC | PRN
  Administered 2016-02-28: 10 mL
  Filled 2016-02-28: qty 10

## 2016-02-28 MED ORDER — PACLITAXEL PROTEIN-BOUND CHEMO INJECTION 100 MG
70.0000 mg/m2 | Freq: Once | INTRAVENOUS | Status: AC
  Administered 2016-02-28: 150 mg via INTRAVENOUS
  Filled 2016-02-28: qty 30

## 2016-02-28 MED ORDER — ONDANSETRON HCL 8 MG PO TABS
8.0000 mg | ORAL_TABLET | Freq: Once | ORAL | Status: AC
  Administered 2016-02-28: 8 mg via ORAL

## 2016-02-28 MED ORDER — HEPARIN SOD (PORK) LOCK FLUSH 100 UNIT/ML IV SOLN
500.0000 [IU] | Freq: Once | INTRAVENOUS | Status: AC | PRN
  Administered 2016-02-28: 500 [IU]
  Filled 2016-02-28: qty 5

## 2016-02-28 NOTE — Patient Instructions (Signed)
[facility] Discharge Instructions for Patients Receiving Chemotherapy  Today you received the following chemotherapy agents: Abraxane   To help prevent nausea and vomiting after your treatment, we encourage you to take your nausea medication as directed.    If you develop nausea and vomiting that is not controlled by your nausea medication, call the clinic.   BELOW ARE SYMPTOMS THAT SHOULD BE REPORTED IMMEDIATELY:  *FEVER GREATER THAN 100.5 F  *CHILLS WITH OR WITHOUT FEVER  NAUSEA AND VOMITING THAT IS NOT CONTROLLED WITH YOUR NAUSEA MEDICATION  *UNUSUAL SHORTNESS OF BREATH  *UNUSUAL BRUISING OR BLEEDING  TENDERNESS IN MOUTH AND THROAT WITH OR WITHOUT PRESENCE OF ULCERS  *URINARY PROBLEMS  *BOWEL PROBLEMS  UNUSUAL RASH Items with * indicate a potential emergency and should be followed up as soon as possible.  Feel free to call the clinic you have any questions or concerns. The clinic phone number is [phone].  Please show the CHEMO ALERT CARD at check-in to the Emergency Department and triage nurse.   

## 2016-02-28 NOTE — Progress Notes (Signed)
[place] OFFICE PROGRESS NOTE  Diagnosis:  Breast cancer     Oncology History   Malignant neoplasm of upper outer quadrant of female breast [facility]) Staging form: Breast, AJCC 7th Edition Pathologic stage from 11/14/2015: Stage IA (T1c, N0, cM0) - Signed by Truitt Merle, MD on 11/25/2015 Clinical stage from 11/20/2015: Stage IA (T1c, N0, M0) - Signed by Curt Bears, MD on 11/20/2015      Malignant neoplasm of upper outer quadrant of female breast ([place])   10/09/2015 Mammogram    Screening bilateral mammograms showed a possible left breast mass. Diagnostic mammogram and ultrasound showed a 7 x 7 x 7 mm mass in the left breast at 11:00 position.     10/22/2015 Receptors her2    ER negative, PR negative, HER-2 not amplified     10/22/2015 Initial Biopsy    Left breast needle core biopsy at the 1:00 position showed invasive ductal carcinoma.     10/22/2015 Initial Diagnosis    Malignant neoplasm of upper outer quadrant of female breast ([place])     11/14/2015 Surgery    Left breast lumpectomy and sentinel lymph node biopsy     11/14/2015 Pathology Results    Left breast lumpectomy showed invasive ductal carcinoma, grade 3, 1.1 cm, resection margins were negative, sentinel lymph node biopsy showed benign breast parenchyma, lymph node tissue not identified. Repeated ER PR and HER-2 were all negative.     12/20/2015 -  Chemotherapy    Weekly Taxol 80 mg/m, changed to Abraxane 100 mg/m from week 3 due to infusion reaction     CURRENT THERAPY: weekly Taxol '80mg'$ /m2, started on 12/20/2015, changed to Abraxane '100mg'$ /m2 weekly from week 3 due to infusion reaction; Abraxane dose reduced to 70 mg/m beginning with week 9 (02/21/2016) due to progressive neuropathy symptoms.    INTERVAL HISTORY:   Ms. Beynon returns as scheduled. She completed week 9 Abraxane 02/21/2016. She denies nausea/vomiting. No mouth sores. No diarrhea. The neuropathy is  better, currently involving only the fingertips rather than onto the hand. She has mild intermittent numbness in the toes. The numbness does not interfere with activity. She reports pain at the Port-A-Cath site following being accessed earlier today.  Objective:  Vital signs in last 24 hours:  Blood pressure (!) 158/67, pulse 65, temperature 99.1 F (37.3 C), temperature source Oral, resp. rate 16, height '5\' 3"'$  (1.6 m), weight 219 lb 12.8 oz (99.7 kg), SpO2 100 %.    HEENT: No thrush or ulcers. Resp: Lungs clear bilaterally. Cardio: Regular rate and rhythm. GI: Abdomen soft and nontender. No hepatomegaly. Vascular: No leg edema. Neuro: Vibratory sense mildly to moderately decreased over the fingertips per tuning fork exam.  Port-A-Cath without erythema.  Lab Results:  Lab Results  Component Value Date   WBC 2.7 (L) 02/28/2016   HGB 9.2 (L) 02/28/2016   HCT 28.7 (L) 02/28/2016   MCV 85.2 02/28/2016   PLT 275 02/28/2016   NEUTROABS 1.5 02/28/2016    Imaging:  No results found.  Medications: I have reviewed the patient's current medications.  Assessment/Plan: 1. Upper outer quadrant of left female breast, invasive ductal carcinoma, grade 3, pT1cN0M0, stage IA, ER-/PR-/HER2-; adjuvant chemotherapy with weekly Taxol initiated 12/20/2015. Chemotherapy changed to weekly Abraxane beginning with the third treatment due to an infusion reaction to the Taxol. 2. MGUS. SPEP/IFE and light chains every 3 months. 3. Anemia, history of chronic anemia, worse last year due to surgery. 4. Joint and muscular pain, leg cramps possibly  related to Taxol. 5. Peripheral neuropathy, increased 02/21/2016. Abraxane dose reduced to 70 mg/m. Improved 02/28/2016.   Disposition: Ms. Shambaugh appears stable. She has completed 9 weekly chemotherapy treatments (Taxol weekly 2, changed to Abraxane with week 3). Plan to proceed with week 10 Abraxane today as scheduled.  The neuropathy symptoms are better.  Abraxane was dose reduced last week. Plan to continue the same.  The Port-A-Cath needle was repositioned by the nurse in the exam room. She noted significant improvement in pain.  She will return for a follow-up visit and week 11 chemotherapy in one week. She will contact the office in the interim with any problems.   Plan reviewed with Dr. Burr Medico.   Ned Card ANP/GNP-BC   02/28/2016  11:41 AM

## 2016-02-28 NOTE — Addendum Note (Signed)
Addended by: Truitt Merle on: 02/28/2016 12:49 PM   Modules accepted: Orders

## 2016-03-06 ENCOUNTER — Encounter: Payer: Self-pay | Admitting: *Deleted

## 2016-03-06 ENCOUNTER — Ambulatory Visit (HOSPITAL_BASED_OUTPATIENT_CLINIC_OR_DEPARTMENT_OTHER): Payer: Medicare Other

## 2016-03-06 ENCOUNTER — Ambulatory Visit: Payer: Medicare Other | Admitting: Nurse Practitioner

## 2016-03-06 ENCOUNTER — Telehealth: Payer: Self-pay | Admitting: Hematology

## 2016-03-06 ENCOUNTER — Other Ambulatory Visit (HOSPITAL_BASED_OUTPATIENT_CLINIC_OR_DEPARTMENT_OTHER): Payer: Medicare Other

## 2016-03-06 ENCOUNTER — Ambulatory Visit (HOSPITAL_BASED_OUTPATIENT_CLINIC_OR_DEPARTMENT_OTHER): Payer: Medicare Other | Admitting: Hematology

## 2016-03-06 ENCOUNTER — Other Ambulatory Visit: Payer: Medicare Other

## 2016-03-06 ENCOUNTER — Encounter: Payer: Self-pay | Admitting: Hematology

## 2016-03-06 VITALS — BP 146/69 | HR 69 | Temp 98.0°F | Ht 63.0 in | Wt 218.0 lb

## 2016-03-06 DIAGNOSIS — G620 Drug-induced polyneuropathy: Secondary | ICD-10-CM | POA: Diagnosis not present

## 2016-03-06 DIAGNOSIS — D638 Anemia in other chronic diseases classified elsewhere: Secondary | ICD-10-CM

## 2016-03-06 DIAGNOSIS — T451X5A Adverse effect of antineoplastic and immunosuppressive drugs, initial encounter: Secondary | ICD-10-CM

## 2016-03-06 DIAGNOSIS — M791 Myalgia: Secondary | ICD-10-CM

## 2016-03-06 DIAGNOSIS — D6481 Anemia due to antineoplastic chemotherapy: Secondary | ICD-10-CM

## 2016-03-06 DIAGNOSIS — D472 Monoclonal gammopathy: Secondary | ICD-10-CM

## 2016-03-06 DIAGNOSIS — Z5111 Encounter for antineoplastic chemotherapy: Secondary | ICD-10-CM

## 2016-03-06 DIAGNOSIS — C50412 Malignant neoplasm of upper-outer quadrant of left female breast: Secondary | ICD-10-CM

## 2016-03-06 DIAGNOSIS — Z171 Estrogen receptor negative status [ER-]: Principal | ICD-10-CM

## 2016-03-06 DIAGNOSIS — Z95828 Presence of other vascular implants and grafts: Secondary | ICD-10-CM

## 2016-03-06 LAB — CBC WITH DIFFERENTIAL/PLATELET
BASO%: 1.5 % (ref 0.0–2.0)
BASOS ABS: 0.1 10*3/uL (ref 0.0–0.1)
EOS%: 2.4 % (ref 0.0–7.0)
Eosinophils Absolute: 0.1 10*3/uL (ref 0.0–0.5)
HEMATOCRIT: 29.1 % — AB (ref 34.8–46.6)
HGB: 9.3 g/dL — ABNORMAL LOW (ref 11.6–15.9)
LYMPH#: 1.1 10*3/uL (ref 0.9–3.3)
LYMPH%: 32.6 % (ref 14.0–49.7)
MCH: 27.4 pg (ref 25.1–34.0)
MCHC: 32 g/dL (ref 31.5–36.0)
MCV: 85.6 fL (ref 79.5–101.0)
MONO#: 0.3 10*3/uL (ref 0.1–0.9)
MONO%: 8.2 % (ref 0.0–14.0)
NEUT#: 1.9 10*3/uL (ref 1.5–6.5)
NEUT%: 55.3 % (ref 38.4–76.8)
Platelets: 260 10*3/uL (ref 145–400)
RBC: 3.4 10*6/uL — AB (ref 3.70–5.45)
RDW: 17.8 % — ABNORMAL HIGH (ref 11.2–14.5)
WBC: 3.4 10*3/uL — ABNORMAL LOW (ref 3.9–10.3)

## 2016-03-06 LAB — COMPREHENSIVE METABOLIC PANEL
ALT: <6 U/L (ref 0–55)
ANION GAP: 8 meq/L (ref 3–11)
AST: 17 U/L (ref 5–34)
Albumin: 3.4 g/dL — ABNORMAL LOW (ref 3.5–5.0)
Alkaline Phosphatase: 68 U/L (ref 40–150)
BUN: 27.4 mg/dL — ABNORMAL HIGH (ref 7.0–26.0)
CALCIUM: 9.3 mg/dL (ref 8.4–10.4)
CHLORIDE: 109 meq/L (ref 98–109)
CO2: 25 meq/L (ref 22–29)
Creatinine: 0.9 mg/dL (ref 0.6–1.1)
EGFR: 66 mL/min/{1.73_m2} — ABNORMAL LOW (ref >90)
Glucose: 90 mg/dl (ref 70–140)
POTASSIUM: 3.8 meq/L (ref 3.5–5.1)
Sodium: 142 mEq/L (ref 136–145)
Total Bilirubin: 0.61 mg/dL (ref 0.20–1.20)
Total Protein: 6.6 g/dL (ref 6.4–8.3)

## 2016-03-06 MED ORDER — SODIUM CHLORIDE 0.9% FLUSH
10.0000 mL | Freq: Once | INTRAVENOUS | Status: AC
  Administered 2016-03-06: 10 mL via INTRAVENOUS
  Filled 2016-03-06: qty 10

## 2016-03-06 MED ORDER — ONDANSETRON HCL 8 MG PO TABS
ORAL_TABLET | ORAL | Status: AC
  Filled 2016-03-06: qty 1

## 2016-03-06 MED ORDER — SODIUM CHLORIDE 0.9 % IV SOLN
Freq: Once | INTRAVENOUS | Status: AC
  Administered 2016-03-06: 12:00:00 via INTRAVENOUS

## 2016-03-06 MED ORDER — HEPARIN SOD (PORK) LOCK FLUSH 100 UNIT/ML IV SOLN
500.0000 [IU] | Freq: Once | INTRAVENOUS | Status: AC | PRN
  Administered 2016-03-06: 500 [IU]
  Filled 2016-03-06: qty 5

## 2016-03-06 MED ORDER — FAMOTIDINE IN NACL 20-0.9 MG/50ML-% IV SOLN
INTRAVENOUS | Status: AC
  Filled 2016-03-06: qty 50

## 2016-03-06 MED ORDER — PACLITAXEL PROTEIN-BOUND CHEMO INJECTION 100 MG
60.0000 mg/m2 | Freq: Once | INTRAVENOUS | Status: AC
  Administered 2016-03-06: 125 mg via INTRAVENOUS
  Filled 2016-03-06: qty 25

## 2016-03-06 MED ORDER — ONDANSETRON HCL 8 MG PO TABS
8.0000 mg | ORAL_TABLET | Freq: Once | ORAL | Status: AC
  Administered 2016-03-06: 8 mg via ORAL

## 2016-03-06 MED ORDER — FAMOTIDINE IN NACL 20-0.9 MG/50ML-% IV SOLN
20.0000 mg | Freq: Once | INTRAVENOUS | Status: AC
  Administered 2016-03-06: 20 mg via INTRAVENOUS

## 2016-03-06 MED ORDER — SODIUM CHLORIDE 0.9% FLUSH
10.0000 mL | INTRAVENOUS | Status: DC | PRN
  Administered 2016-03-06: 10 mL
  Filled 2016-03-06: qty 10

## 2016-03-06 NOTE — Telephone Encounter (Signed)
GAVE DTR AVS REPORT AND APPOINTMENTS FOR October AND November. NO REFERRALS FOR PT OR [facility] ENTERED.   WHEN REFERRALS ENTERED - OFFICES WILL CONTACT PATIENT S/W [facility] IN [place].

## 2016-03-06 NOTE — Patient Instructions (Signed)
[facility] Discharge Instructions for Patients Receiving Chemotherapy  Today you received the following chemotherapy agents: Abraxane.  To help prevent nausea and vomiting after your treatment, we encourage you to take your nausea medication  If needed. IIf you develop nausea and vomiting that is not controlled by your nausea medication, call the clinic.   BELOW ARE SYMPTOMS THAT SHOULD BE REPORTED IMMEDIATELY:  *FEVER GREATER THAN 100.5 F  *CHILLS WITH OR WITHOUT FEVER  NAUSEA AND VOMITING THAT IS NOT CONTROLLED WITH YOUR NAUSEA MEDICATION  *UNUSUAL SHORTNESS OF BREATH  *UNUSUAL BRUISING OR BLEEDING  TENDERNESS IN MOUTH AND THROAT WITH OR WITHOUT PRESENCE OF ULCERS  *URINARY PROBLEMS  *BOWEL PROBLEMS  UNUSUAL RASH Items with * indicate a potential emergency and should be followed up as soon as possible.  Feel free to call the clinic you have any questions or concerns. The clinic phone number is [phone].  Please show the [place] at check-in to the Emergency Department and triage nurse.

## 2016-03-06 NOTE — Progress Notes (Signed)
[place]  [phone] [phone]  Clinic follow up Note   Patient Care Team: [name], MD as PCP - General (Internal Medicine) Valinda Party, MD as Referring Physician (Rheumatology) 03/06/2016  CHIEF COMPLAINTS:  Follow up left breast cancer   Oncology History   Malignant neoplasm of upper outer quadrant of female breast [facility])   Staging form: Breast, AJCC 7th Edition     Pathologic stage from 11/14/2015: Stage IA (T1c, N0, cM0) - Signed by Truitt Merle, MD on 11/25/2015     Clinical stage from 11/20/2015: Stage IA (T1c, N0, M0) - Signed by Curt Bears, MD on 11/20/2015       Malignant neoplasm of upper outer quadrant of female breast ([place])   10/09/2015 Mammogram    Screening bilateral mammograms showed a possible left breast mass. Diagnostic mammogram and ultrasound showed a 7 x 7 x 7 mm mass in the left breast at 11:00 position.      10/22/2015 Receptors her2    ER negative, PR negative, HER-2 not amplified      10/22/2015 Initial Biopsy    Left breast needle core biopsy at the 1:00 position showed invasive ductal carcinoma.      10/22/2015 Initial Diagnosis    Malignant neoplasm of upper outer quadrant of female breast ([place])      11/14/2015 Surgery    Left breast lumpectomy and sentinel lymph node biopsy      11/14/2015 Pathology Results    Left breast lumpectomy showed invasive ductal carcinoma, grade 3, 1.1 cm, resection margins were negative, sentinel lymph node biopsy showed benign breast parenchyma, lymph node tissue not identified. Repeated ER PR and HER-2 were all negative.      12/20/2015 -  Chemotherapy    Weekly Taxol 80 mg/m, changed to Abraxane 100 mg/m from week 3 due to infusion reaction, and dose reduced from week 9 due to neuropathy.       HISTORY OF PRESENTING ILLNESS:  Julie [name] 82 y.o. female is here because of her recently diagnosed left breast cancer. She has been seen my partner Dr. Julien Nordmann for MGUS,  and was referred to me to discuss her adjuvant therapy for breast cancer. She is accompanied by her daughter to the clinic today.  Her breast cancer was discovered by screening mammogram. She has no palpable breast mass, skin change or nipple discharge. She denies any new constitutional symptoms. The diagnostic mammogram and ultrasound showed a 7 mm mass in the left breast, biopsy showed invasive ductal carcinoma, triple negative. She was seen by breast surgeon Dr. [name], and underwent left rest lumpectomy and sentinel lymph node biopsy on 11/14/2015.   She had a multiple orthopedic surgeries in the past, takes tramadol as needed. She lives with her daughter, pretty independent, does not exercise regularly, but it goes out for shopping, playing cards, etc.  GYN HISTORY  Menarchal: 16 LMP: 49 (hysrectomy)  Contraceptive: 2 HRT: 1 yr  G5P5: she did breast feeding to 2 children   CURRENT THERAPY: weekly Taxol '80mg'$ /m2, started on 12/20/2015, changed to Abraxane '100mg'$ /m2 weekly from week 3 due to infusion reaction, and dose reduction from week 9 due to neuropathy  INTERIM HISTORY: Amoy returns for follow-up and week 11 chemo. She is doing well overall. Her main complaint is numbness of her fingers, she is still able to button, putting her earrings on, etc., with slight difficulty. No significant tingling or pain. She has mild intermittent numbness on her toes, not as  bad on her fingers. She also noticed a skin lump above her vagina, no significant pain or vaginal discharge. No fever or chills, no other new complaints.  MEDICAL HISTORY:  Past Medical History:  Diagnosis Date  . Anemia   . Anxiety    takes Klonopin at bedtime  . Arthritis    takes Methotrexate weekly and Prednisone  . Blood transfusion    no abnormal reaction noted  . Cataracts, bilateral    immature  . Complication of anesthesia    wakes up during surgery  . Depression   . Diabetes mellitus without complication ([place])     . Fibromyalgia    takes Lyrica daily  . GERD (gastroesophageal reflux disease)    takes Nexium daily  . Glaucoma   . Hard of hearing   . Heart murmur   . Histoplasmosis   . History of colon polyps   . History of gout   . History of hiatal hernia   . History of shingles   . Hyperlipidemia   . Hypertension    takes Amlodipine,Metoprolol,and Losartan daily  . Incontinence of bowel   . Interstitial cystitis   . Joint pain   . Nocturia   . Peripheral neuropathy ([place])   . PONV (postoperative nausea and vomiting)   . Ulcer ([place]) 1970   duodenal  . Urinary frequency   . Urinary urgency   . Weakness    numbness in both hands and feet    SURGICAL HISTORY: Past Surgical History:  Procedure Laterality Date  . ABDOMINAL HYSTERECTOMY    . accupuncture  3 yrs ago  . ANKLE FUSION Left 05/10/2014   Procedure: LEFT ANKLE ARTHRODESIS ;  Surgeon: Wylene Simmer, MD;  Location: [place];  Service: Orthopedics;  Laterality: Left;  . ANKLE SURGERY Left   . arm surgery Left    radius  . bladder tacked     . CERVICAL FUSION    . CHOLECYSTECTOMY    . COLONOSCOPY    . CYSTOCELE REPAIR    . DILATION AND CURETTAGE OF UTERUS    . ESOPHAGOGASTRODUODENOSCOPY    . PORT A CATH REVISION N/A 12/27/2015   Procedure: PORT A CATH REVISION;  Surgeon: Rolm Bookbinder, MD;  Location: [place];  Service: General;  Laterality: N/A;  . PORTACATH PLACEMENT Right 12/27/2015   Procedure: INSERTION PORT-A-CATH WITH ULTRASOUND ;  Surgeon: Rolm Bookbinder, MD;  Location: [place];  Service: General;  Laterality: Right;  . RADIOACTIVE SEED GUIDED MASTECTOMY WITH AXILLARY SENTINEL LYMPH NODE BIOPSY Left 11/14/2015   Procedure: RADIOACTIVE SEED GUIDED LEFT BREAST LUMPECTOMY WITH AXILLARY SENTINEL LYMPH NODE BIOPSY;  Surgeon: Rolm Bookbinder, MD;  Location: [place];  Service: General;  Laterality: Left;  RADIOACTIVE SEED GUIDED LEFT BREAST LUMPECTOMY WITH AXILLARY  SENTINEL LYMPH NODE BIOPSY  . SALPINGOOPHORECTOMY Bilateral   . TOTAL KNEE ARTHROPLASTY Right 02/28/2015   Procedure: RIGHT TOTAL KNEE ARTHROPLASTY;  Surgeon: Rod Can, MD;  Location: [facility];  Service: Orthopedics;  Laterality: Right;  . UPPER GASTROINTESTINAL ENDOSCOPY    . VAGINAL HYSTERECTOMY      SOCIAL HISTORY: Social History   Social History  . Marital status: Divorced    Spouse name: N/A  . Number of children: N/A  . Years of education: N/A   Occupational History  . Not on file.   Social History Main Topics  . Smoking status: Never Smoker  . Smokeless tobacco: Never Used  . Alcohol use No  Comment: IN [place] ONLY  . Drug use: No  . Sexual activity: Not on file   Other Topics Concern  . Not on file   Social History Narrative  . No narrative on file    FAMILY HISTORY: Family History  Problem Relation Age of Onset  . Colon cancer Sister 59  . Esophageal cancer Brother 49  . Breast cancer Mother     ALLERGIES:  is allergic to other and aspirin.  MEDICATIONS:  Current Outpatient Prescriptions  Medication Sig Dispense Refill  . acetaminophen (TYLENOL) 500 MG tablet Take 500 mg by mouth every 6 (six) hours as needed.    [name] amLODipine (NORVASC) 10 MG tablet Take 10 mg by mouth every morning.     . B-D TB SYRINGE 1CC/25GX5/8" 25G X 5/8" 1 ML MISC USE WITH METHOTREXATE  0  . calcium-vitamin D (OSCAL WITH D) 500-200 MG-UNIT per tablet Take 1 tablet by mouth every morning.    . clonazePAM (KLONOPIN) 0.5 MG tablet Take 0.5 mg by mouth at bedtime.    [name] ezetimibe (ZETIA) 10 MG tablet Take 10 mg by mouth every other day. Trial, taking every other day    . fish oil-omega-3 fatty acids 1000 MG capsule Take 1 g by mouth every morning.     . folic acid (FOLVITE) 1 MG tablet Take 2 mg by mouth every morning.     . lidocaine-prilocaine (EMLA) cream Apply to affected area once 30 g 3  . meloxicam (MOBIC) 15 MG tablet Take 15 mg by mouth daily.  11  . metFORMIN  (GLUCOPHAGE) 500 MG tablet Take by mouth 2 (two) times daily with a meal.    . methotrexate (50 MG/ML) 1 g injection Inject into the skin once a week.    . metoprolol succinate (TOPROL-XL) 50 MG 24 hr tablet TAKE 1 TABLET AT BEDTIME ONCE A DAY ORALLY 90 DAYS  2  . ondansetron (ZOFRAN) 8 MG tablet Take 1 tablet (8 mg total) by mouth 2 (two) times daily as needed (Nausea or vomiting). 30 tablet 1  . pantoprazole (PROTONIX) 40 MG tablet TAKE 1 TABLET (40 MG TOTAL) BY MOUTH DAILY. 30 tablet 0  . pregabalin (LYRICA) 75 MG capsule Take 75 mg by mouth 2 (two) times daily.    . prochlorperazine (COMPAZINE) 10 MG tablet Take 1 tablet (10 mg total) by mouth every 6 (six) hours as needed (Nausea or vomiting). 30 tablet 1  . traMADol (ULTRAM) 50 MG tablet Take by mouth every 6 (six) hours as needed. Reported on 11/25/2015    . valsartan-hydrochlorothiazide (DIOVAN-HCT) 320-12.5 MG per tablet Take 1 tablet by mouth daily.    . vitamin E 400 UNIT capsule Take 400 Units by mouth daily.     No current facility-administered medications for this visit.    Facility-Administered Medications Ordered in Other Visits  Medication Dose Route Frequency Provider Last Rate Last Dose  . 0.9 %  sodium chloride infusion   Intravenous Once Truitt Merle, MD      . famotidine (PEPCID) IVPB 20 mg premix  20 mg Intravenous Once Truitt Merle, MD      . heparin lock flush 100 unit/mL  500 Units Intracatheter Once PRN Truitt Merle, MD      . ondansetron [facility]) tablet 8 mg  8 mg Oral Once Truitt Merle, MD      . PACLitaxel-protein bound (ABRAXANE) chemo infusion 125 mg  60 mg/m2 (Treatment Plan Recorded) Intravenous Once Truitt Merle, MD      .  sodium chloride flush (NS) 0.9 % injection 10 mL  10 mL Intracatheter PRN Truitt Merle, MD        REVIEW OF SYSTEMS:   Constitutional: Denies fevers, chills or abnormal night sweats Eyes: Denies blurriness of vision, double vision or watery eyes Ears, nose, mouth, throat, and face: Denies mucositis or sore  throat Respiratory: Denies cough, dyspnea or wheezes Cardiovascular: Denies palpitation, chest discomfort or lower extremity swelling Gastrointestinal:  Denies nausea, heartburn or change in bowel habits Skin: Denies abnormal skin rashes Lymphatics: Denies new lymphadenopathy or easy bruising Neurological:Denies numbness, tingling or new weaknesses Behavioral/Psych: Mood is stable, no new changes  All other systems were reviewed with the patient and are negative.  PHYSICAL EXAMINATION: ECOG PERFORMANCE STATUS: 1 - Symptomatic but completely ambulatory Blood pressure 146/69, pulse 69, respiratory rate 18, pulse ox 100% on room air, Epogen 98, weight 218 pounds GENERAL:alert, no distress and comfortable SKIN: skin color, texture, turgor are normal, no rashes or significant lesions. Her right 3rd toe nail is loose, no significant other nail changes. There is a boil above her viginal area  EYES: normal, conjunctiva are pink and non-injected, sclera clear OROPHARYNX:no exudate, no erythema and lips, buccal mucosa, and tongue normal  NECK: supple, thyroid normal size, non-tender, without nodularity LYMPH:  no palpable lymphadenopathy in the cervical, axillary or inguinal LUNGS: clear to auscultation and percussion with normal breathing effort HEART: regular rate & rhythm and no murmurs and no lower extremity edema ABDOMEN:abdomen soft, non-tender and normal bowel sounds Musculoskeletal:no cyanosis of digits and no clubbing  PSYCH: alert & oriented x 3 with fluent speech NEURO: no focal motor/sensory deficits, except mild decreased vibration sensation on her hands Breasts: Breast inspection showed them to be symmetrical with no nipple discharge. Surgical incisions in the left breast and axilla have healed well. No skin erythema or discharge. Mild bruise at her port site. Palpation of the breasts and axilla revealed no obvious mass that I could appreciate.   LABORATORY DATA:  I have reviewed the  data as listed CBC Latest Ref Rng & Units 03/06/2016 02/28/2016 02/21/2016  WBC 3.9 - 10.3 10e3/uL 3.4(L) 2.7(L) 3.5(L)  Hemoglobin 11.6 - 15.9 g/dL 9.3(L) 9.2(L) 9.3(L)  Hematocrit 34.8 - 46.6 % 29.1(L) 28.7(L) 28.9(L)  Platelets 145 - 400 10e3/uL 260 275 288   CMP Latest Ref Rng & Units 03/06/2016 02/28/2016 02/21/2016  Glucose 70 - 140 mg/dl 90 74 89  BUN 7.0 - 26.0 mg/dL 27.4(H) 21.7 29.7(H)  Creatinine 0.6 - 1.1 mg/dL 0.9 0.9 1.1  Sodium 136 - 145 mEq/L 142 142 144  Potassium 3.5 - 5.1 mEq/L 3.8 4.0 4.0  Chloride 101 - 111 mmol/L - - -  CO2 22 - 29 mEq/L '25 25 24  '$ Calcium 8.4 - 10.4 mg/dL 9.3 9.1 9.3  Total Protein 6.4 - 8.3 g/dL 6.6 6.4 6.7  Total Bilirubin 0.20 - 1.20 mg/dL 0.61 0.50 0.55  Alkaline Phos 40 - 150 U/L 68 64 65  AST 5 - 34 U/L '17 17 17  '$ ALT 0-55 U/L U/L <6 <9 9   ANC 2.1 today   PATHOLOGY REPORT  Diagnosis 11/14/2015 1. Breast, lumpectomy, Left - INVASIVE DUCTAL CARCINOMA, GRADE III/III, SPANNING 1.1 CM. - THE RESECTION MARGINS ARE NEGATIVE FOR CARCINOMA. - SEE ONCOLOGY TABLE BELOW. 2. Lymph node, sentinel, biopsy, Left Axillary - BENIGN BREAST PARENCHYMA. - LYMPH NODAL TISSUE IS NOT IDENTIFIED. - THERE IS NO EVIDENCE OF MALIGNANCY. Microscopic Comment 1. BREAST, INVASIVE TUMOR, WITH LYMPH NODES PRESENT Specimen,  including laterality and lymph node sampling (sentinel, non-sentinel): Left breast Procedure: Seed localized lumpectomy Histologic type: Ductal Grade: III Tubule formation: 2 Nuclear pleomorphism: 3 Mitotic: 3 Tumor size (gross measurement): 1.1 cm Margins: Greater than 0.2 cm to all margins Lymphovascular invasion: Not identified Ductal carcinoma in situ: Not identified. Lobular neoplasia: Not identified. Tumor focality: Unifocal Treatment effect: N/A Extent of tumor: Confined to breast parenchyma Lymph nodes: None examined. Breast prognostic profile: Case [id number] Estrogen receptor: 0% Progesterone receptor: 0% Her 2 neu: No  amplification was detected. Ki-67: 80% A breast prognostic profile will be repeated on the case and the results reported separately. Non-neoplastic breast: No significant findings. TNM: pT1c, pNX (JBK:kh 11-15-15) 2 of 4 FINAL for Grosch, Naraya L ([id number]) Enid Cutter MD Pathologist, Electronic  Results: IMMUNOHISTOCHEMICAL AND MORPHOMETRIC ANALYSIS PERFORMED MANUALLY Estrogen Receptor: 0%, NEGATIVE Progesterone Receptor: 0%, NEGATIVE  Results: HER2 - NEGATIVE RATIO OF HER2/CEP17 SIGNALS 1.32 AVERAGE HER2 COPY NUMBER PER CELL 3.10   RADIOGRAPHIC STUDIES: I have personally reviewed the radiological images as listed and agreed with the findings in the report. No results found.  ASSESSMENT & PLAN:  82 year old female, with past medical history of MGUS, hypertension, arthritis, with good performance status, was found to have stage I triple negative left breast cancer by screening mammogram.  1. Malignant or new present of upper outer quadrant of left female breast, invasive ductal carcinoma, grade 3, pT1cN0M0, stage IA, ER-/PR-/HER2- --I discussed her breast mammogram, ultrasound, biopsy and final surgical path result in details -She had sentinel lymph node biopsy, however it showed normal breast parenchyma, no lymphoid tissue, her pathological node status is unknown, no ultrasound evidence of adenopathy. -We reviewed the aggressive nature of triple negative breast cancer. Giving her stage I disease, I think she probably has 20-30% risk of distant recurrence in the next 5-10 years.  -I recommend weekly Taxol for 12 weeks as adjuvant chemotherapy to reduce her risk of cancer recurrence. -She has been tolerating chemo well, Taxol was changed to Abraxane from week 3 due to infusion reaction. . -She is tolerating chemotherapy well, except neuropathy. I'll further decrease her Abraxane to '60mg'$ /m2 for the last two weeks treatments  2. MGUS -will continue follow up SPEP/IFE, and light  chain levels every 3 month  -I'll be repeated today, results still pending  3. Anemia  -she has chronic anemia, worse last year due to her surgery  -Overall stable, we'll continue monitoring  4. Joint and muscular pain, leg cramps  -She has noticed mild and intermittent muscular pain in her hands, right big toe, and buttocks, possible related to Taxol. -I encouraged her to take the calcium and magnesium over-the-counter, drink fluids adequately. She'll also use Tylenol or ibuprofen as needed for muscle pain. -We'll continue monitoring, improved some lately  5. Mild peripheral neuropathy, G1 -No impact on her function, we'll continue monitor closely -We'll decrease her Abraxane dose for the last 2 treatments -She has mild gait instability due to neuropathy and fatigue, I recommend her to get physical therapy, she is interested.  Plan -Lab reviewed, proceed with cycle 11 Abraxane today with decreased Abraxane dose, she will complete next week -I'll see her back in 4 weeks for follow-up -[facility] referral for adjuvant breast radiation -PT referral for her neuropathy and gait stability -Port removal by Dr. [name] after she completes chemotherapy. I sent a message to Dr. [name].  All questions were answered. The patient knows to call the clinic with any problems, questions or concerns.  I spent  20 minutes counseling the patient face to face. The total time spent in the appointment was 25 minutes and more than 50% was on counseling.     Truitt Merle, MD 03/06/2016

## 2016-03-06 NOTE — Patient Instructions (Signed)

## 2016-03-08 ENCOUNTER — Telehealth: Payer: Self-pay | Admitting: Hematology

## 2016-03-08 ENCOUNTER — Encounter (HOSPITAL_COMMUNITY): Payer: Self-pay | Admitting: Emergency Medicine

## 2016-03-08 ENCOUNTER — Emergency Department (HOSPITAL_COMMUNITY)
Admission: EM | Admit: 2016-03-08 | Discharge: 2016-03-08 | Disposition: A | Discharge status: Home / Self Care | Payer: Medicare Other | Attending: Emergency Medicine | Admitting: Emergency Medicine

## 2016-03-08 DIAGNOSIS — E119 Type 2 diabetes mellitus without complications: Secondary | ICD-10-CM | POA: Insufficient documentation

## 2016-03-08 DIAGNOSIS — I10 Essential (primary) hypertension: Secondary | ICD-10-CM | POA: Diagnosis not present

## 2016-03-08 DIAGNOSIS — Z79899 Other long term (current) drug therapy: Secondary | ICD-10-CM | POA: Diagnosis not present

## 2016-03-08 DIAGNOSIS — Z452 Encounter for adjustment and management of vascular access device: Secondary | ICD-10-CM

## 2016-03-08 DIAGNOSIS — Z7984 Long term (current) use of oral hypoglycemic drugs: Secondary | ICD-10-CM | POA: Insufficient documentation

## 2016-03-08 MED ORDER — CEPHALEXIN 500 MG PO CAPS: 500.0000 mg | ORAL_CAPSULE | Freq: Four times a day (QID) | ORAL | 0 refills | Status: DC

## 2016-03-08 NOTE — Discharge Instructions (Signed)
Get rechecked immediately if you develop fevers, worsening swelling, malaise, or new concerning symptoms.

## 2016-03-08 NOTE — ED Notes (Signed)
Pt R port site is pink and warm to touch. Pt sts that you can usually see the port "bumps" under the skin. They are not visible and there is mild edema to top of port site.

## 2016-03-08 NOTE — ED Triage Notes (Signed)
Patient states that her right chest porta-cath has swelling around it that started around 6am this morning. patient states pain with palpation.  Patient reports last accessed on Friday when she had chemo.  Patient getting chemo for breast cancer.

## 2016-03-08 NOTE — Telephone Encounter (Signed)
BOTH [facility] AND [facility] WILL CONTACT PATIENT RE APPOINTMENTS - SEE REFERRALS.  NO LOS COMPLETED FOR 10/13 - PATIENT IN FOR FLUIDS ON [facility] SCHEDULE.

## 2016-03-08 NOTE — ED Provider Notes (Signed)
[facility] DEPT Provider Note   CSN: [id number] Arrival date & time: 03/08/16  1119     History   Chief Complaint Chief Complaint  Patient presents with  . porta cath swelling    HPI Julie [name] is a 82 y.o. female.  The history is provided by the patient. No language interpreter was used.    Julie [name] is a 82 y.o. female who presents to the Emergency Department complaining of port a cath swelling.  She receives chemotherapy every Friday. Over the last 3 times her port has been accessed she noticed pain in the area. Her port was last accessed 2 days ago. This morning when she awoke she noticed some swelling and soreness to the area. She denies any fevers, chills, systemic symptoms. She does not take any blood thinners.  Past Medical History:  Diagnosis Date  . Anemia   . Anxiety    takes Klonopin at bedtime  . Arthritis    takes Methotrexate weekly and Prednisone  . Blood transfusion    no abnormal reaction noted  . Cataracts, bilateral    immature  . Complication of anesthesia    wakes up during surgery  . Depression   . Diabetes mellitus without complication ([place])   . Fibromyalgia    takes Lyrica daily  . GERD (gastroesophageal reflux disease)    takes Nexium daily  . Glaucoma   . Hard of hearing   . Heart murmur   . Histoplasmosis   . History of colon polyps   . History of gout   . History of hiatal hernia   . History of shingles   . Hyperlipidemia   . Hypertension    takes Amlodipine,Metoprolol,and Losartan daily  . Incontinence of bowel   . Interstitial cystitis   . Joint pain   . Nocturia   . Peripheral neuropathy ([place])   . PONV (postoperative nausea and vomiting)   . Ulcer ([place]) 1970   duodenal  . Urinary frequency   . Urinary urgency   . Weakness    numbness in both hands and feet    Patient Active Problem List   Diagnosis Date Noted  . Anemia due to antineoplastic chemotherapy 03/06/2016  . Hypersensitivity reaction  01/07/2016  . Port catheter in place 01/03/2016  . Malignant neoplasm of upper outer quadrant of female breast ([place]) 10/30/2015  . Primary osteoarthritis of right knee 02/28/2015  . Post-traumatic arthritis of ankle 05/10/2014  . MGUS (monoclonal gammopathy of unknown significance) 02/17/2012  . Diverticulosis of colon (without mention of hemorrhage) 01/21/2011  . Family history of malignant neoplasm of gastrointestinal tract 01/21/2011  . Special screening for malignant neoplasms, colon 01/21/2011  . Other symptoms involving digestive system(787.99) 01/21/2011    Past Surgical History:  Procedure Laterality Date  . ABDOMINAL HYSTERECTOMY    . accupuncture  3 yrs ago  . ANKLE FUSION Left 05/10/2014   Procedure: LEFT ANKLE ARTHRODESIS ;  Surgeon: Wylene Simmer, MD;  Location: [facility];  Service: Orthopedics;  Laterality: Left;  . ANKLE SURGERY Left   . arm surgery Left    radius  . bladder tacked     . CERVICAL FUSION    . CHOLECYSTECTOMY    . COLONOSCOPY    . CYSTOCELE REPAIR    . DILATION AND CURETTAGE OF UTERUS    . ESOPHAGOGASTRODUODENOSCOPY    . PORT A CATH REVISION N/A 12/27/2015   Procedure: PORT A CATH REVISION;  Surgeon: Rolm Bookbinder, MD;  Location: [facility];  Service: General;  Laterality: N/A;  . PORTACATH PLACEMENT Right 12/27/2015   Procedure: INSERTION PORT-A-CATH WITH ULTRASOUND ;  Surgeon: Rolm Bookbinder, MD;  Location: [facility];  Service: General;  Laterality: Right;  . RADIOACTIVE SEED GUIDED MASTECTOMY WITH AXILLARY SENTINEL LYMPH NODE BIOPSY Left 11/14/2015   Procedure: RADIOACTIVE SEED GUIDED LEFT BREAST LUMPECTOMY WITH AXILLARY SENTINEL LYMPH NODE BIOPSY;  Surgeon: Rolm Bookbinder, MD;  Location: [facility];  Service: General;  Laterality: Left;  RADIOACTIVE SEED GUIDED LEFT BREAST LUMPECTOMY WITH AXILLARY SENTINEL LYMPH NODE BIOPSY  . SALPINGOOPHORECTOMY Bilateral   . TOTAL KNEE ARTHROPLASTY Right 02/28/2015    Procedure: RIGHT TOTAL KNEE ARTHROPLASTY;  Surgeon: Rod Can, MD;  Location: [facility];  Service: Orthopedics;  Laterality: Right;  . UPPER GASTROINTESTINAL ENDOSCOPY    . VAGINAL HYSTERECTOMY      OB History    No data available       Home Medications    Prior to Admission medications   Medication Sig Start Date End Date Taking? Authorizing Provider  acetaminophen (TYLENOL) 500 MG tablet Take 500 mg by mouth every 6 (six) hours as needed.    Historical Provider, MD  amLODipine (NORVASC) 10 MG tablet Take 10 mg by mouth every morning.     Historical Provider, MD  B-D TB SYRINGE 1CC/25GX5/8" 25G X 5/8" 1 ML MISC USE WITH METHOTREXATE 11/01/15   Historical Provider, MD  calcium-vitamin D (OSCAL WITH D) 500-200 MG-UNIT per tablet Take 1 tablet by mouth every morning.    Historical Provider, MD  clonazePAM (KLONOPIN) 0.5 MG tablet Take 0.5 mg by mouth at bedtime.    Historical Provider, MD  ezetimibe (ZETIA) 10 MG tablet Take 10 mg by mouth every other day. Trial, taking every other day    Historical Provider, MD  fish oil-omega-3 fatty acids 1000 MG capsule Take 1 g by mouth every morning.     Historical Provider, MD  folic acid (FOLVITE) 1 MG tablet Take 2 mg by mouth every morning.     Historical Provider, MD  lidocaine-prilocaine (EMLA) cream Apply to affected area once 12/20/15   Truitt Merle, MD  meloxicam (MOBIC) 15 MG tablet Take 15 mg by mouth daily. 11/09/15   Historical Provider, MD  metFORMIN (GLUCOPHAGE) 500 MG tablet Take by mouth 2 (two) times daily with a meal.    Historical Provider, MD  methotrexate (50 MG/ML) 1 g injection Inject into the skin once a week.    Historical Provider, MD  metoprolol succinate (TOPROL-XL) 50 MG 24 hr tablet TAKE 1 TABLET AT BEDTIME ONCE A DAY ORALLY 90 DAYS 08/29/15   Historical Provider, MD  ondansetron (ZOFRAN) 8 MG tablet Take 1 tablet (8 mg total) by mouth 2 (two) times daily as needed (Nausea or vomiting). 12/20/15   Truitt Merle, MD  pantoprazole  (PROTONIX) 40 MG tablet TAKE 1 TABLET (40 MG TOTAL) BY MOUTH DAILY. 02/13/16   Truitt Merle, MD  pregabalin (LYRICA) 75 MG capsule Take 75 mg by mouth 2 (two) times daily.    Historical Provider, MD  prochlorperazine (COMPAZINE) 10 MG tablet Take 1 tablet (10 mg total) by mouth every 6 (six) hours as needed (Nausea or vomiting). 12/20/15   Truitt Merle, MD  traMADol (ULTRAM) 50 MG tablet Take by mouth every 6 (six) hours as needed. Reported on 11/25/2015    Historical Provider, MD  valsartan-hydrochlorothiazide (DIOVAN-HCT) 320-12.5 MG per tablet Take 1 tablet by mouth daily.    Historical  Provider, MD  vitamin E 400 UNIT capsule Take 400 Units by mouth daily.    Historical Provider, MD    Family History Family History  Problem Relation Age of Onset  . Colon cancer Sister 60  . Esophageal cancer Brother 67  . Breast cancer Mother     Social History Social History  Substance Use Topics  . Smoking status: Never Smoker  . Smokeless tobacco: Never Used  . Alcohol use No     Comment: IN [place] ONLY     Allergies   Other and Aspirin   Review of Systems Review of Systems  All other systems reviewed and are negative.    Physical Exam Updated Vital Signs BP 124/62 (BP Location: Right Arm)   Pulse (!) 57   Temp 97.9 F (36.6 C) (Oral)   Resp 18   SpO2 96%   Physical Exam  Constitutional: She is oriented to person, place, and time. She appears well-developed and well-nourished.  HENT:  Head: Normocephalic and atraumatic.  Cardiovascular: Normal rate and regular rhythm.   No murmur heard. Pulmonary/Chest: Effort normal and breath sounds normal. No respiratory distress.  Port-A-Cath in the right anterior chest wall with mild local redness and swelling. Minimal local tenderness. No erythema.  Abdominal: Soft. There is no tenderness. There is no rebound and no guarding.  Musculoskeletal: She exhibits no edema or tenderness.  Neurological: She is alert and oriented to person, place, and  time.  Skin: Skin is warm and dry.  Psychiatric: She has a normal mood and affect. Her behavior is normal.  Nursing note and vitals reviewed.    ED Treatments / Results  Labs (all labs ordered are listed, but only abnormal results are displayed) Labs Reviewed - No data to display  EKG  EKG Interpretation None       Radiology No results found.  Procedures Procedures (including critical care time)  Medications Ordered in ED Medications - No data to display   Initial Impression / Assessment and Plan / ED Course  I have reviewed the triage vital signs and the nursing notes.  Pertinent labs & imaging results that were available during my care of the patient were reviewed by me and considered in my medical decision making (see chart for details).  Clinical Course    Patient here for evaluation of swelling over her Port-A-Cath site. There is mild local redness and tenderness but no heat or fluctuance. She has no systemic symptoms. D/w Dr. Jana Hakim with Oncology. Plan to initiate antibiotics for potential developing cellulitis with close oncology follow-up for recheck of her site.  Final Clinical Impressions(s) / ED Diagnoses   Final diagnoses:  Encounter for care related to Port-a-Cath    New Prescriptions New Prescriptions   No medications on file     Quintella Reichert, MD 03/08/16 [id number]

## 2016-03-09 ENCOUNTER — Telehealth: Payer: Self-pay | Admitting: *Deleted

## 2016-03-09 LAB — KAPPA/LAMBDA LIGHT CHAINS
Ig Kappa Free Light Chain: 46.9 mg/L — ABNORMAL HIGH (ref 3.3–19.4)
Ig Lambda Free Light Chain: 14.3 mg/L (ref 5.7–26.3)
Kappa/Lambda FluidC Ratio: 3.28 — ABNORMAL HIGH (ref 0.26–1.65)

## 2016-03-09 NOTE — Telephone Encounter (Signed)
Spoke with pt and was informed that pt still has some swelling at port site.  Stated she was given  Keflex 500 mg by mouth  Four times per day for 5 days.  Started Keflex in the pm of Sun 03/08/16. Informed pt that nurse will assess port on Friday.  If port still swollen, pt will need to have labs and chemo peripherally. Port will be removed after last chemo treatment.   Pt understood that she will need to continue with Keflex as prescribed.

## 2016-03-09 NOTE — Telephone Encounter (Signed)
Received call @ 829 Pt. said that she was in the ER on yesterday and said that there was something wrong with her port. She also stated that ER Dr. told her to ask Dr. Burr Medico  how to proceed with care. Pt. call was transferred to vmail/message was left.

## 2016-03-10 LAB — MULTIPLE MYELOMA PANEL, SERUM
ALBUMIN/GLOB SERPL: 1.4 (ref 0.7–1.7)
ALPHA2 GLOB SERPL ELPH-MCNC: 0.6 g/dL (ref 0.4–1.0)
Albumin SerPl Elph-Mcnc: 3.7 g/dL (ref 2.9–4.4)
Alpha 1: 0.2 g/dL (ref 0.0–0.4)
B-GLOBULIN SERPL ELPH-MCNC: 1.5 g/dL — AB (ref 0.7–1.3)
GAMMA GLOB SERPL ELPH-MCNC: 0.5 g/dL (ref 0.4–1.8)
Globulin, Total: 2.7 g/dL (ref 2.2–3.9)
IgA, Qn, Serum: 668 mg/dL — ABNORMAL HIGH (ref 64–422)
IgG, Qn, Serum: 538 mg/dL — ABNORMAL LOW (ref 700–1600)
IgM, Qn, Serum: 35 mg/dL (ref 26–217)
M Protein SerPl Elph-Mcnc: 0.4 g/dL — ABNORMAL HIGH
Total Protein: 6.4 g/dL (ref 6.0–8.5)

## 2016-03-11 ENCOUNTER — Ambulatory Visit: Payer: Medicare Other | Attending: Hematology | Admitting: Physical Therapy

## 2016-03-11 DIAGNOSIS — M542 Cervicalgia: Secondary | ICD-10-CM | POA: Diagnosis not present

## 2016-03-11 DIAGNOSIS — R2681 Unsteadiness on feet: Secondary | ICD-10-CM

## 2016-03-11 DIAGNOSIS — M6281 Muscle weakness (generalized): Secondary | ICD-10-CM

## 2016-03-11 DIAGNOSIS — R262 Difficulty in walking, not elsewhere classified: Secondary | ICD-10-CM | POA: Diagnosis not present

## 2016-03-11 DIAGNOSIS — M549 Dorsalgia, unspecified: Secondary | ICD-10-CM | POA: Diagnosis not present

## 2016-03-11 DIAGNOSIS — R29898 Other symptoms and signs involving the musculoskeletal system: Secondary | ICD-10-CM

## 2016-03-11 DIAGNOSIS — R350 Frequency of micturition: Secondary | ICD-10-CM | POA: Diagnosis not present

## 2016-03-11 DIAGNOSIS — M25511 Pain in right shoulder: Secondary | ICD-10-CM | POA: Diagnosis not present

## 2016-03-11 NOTE — Therapy (Signed)
[place], [name], [address] Phone: [phone]   Fax:  [phone]  Physical Therapy Evaluation  Patient Details  Name: Julie [name] MRN: [id number] Date of Birth: 12-21-1932 Referring Provider: Truitt Merle, MD  Encounter Date: 03/11/2016      PT End of Session - 03/11/16 1454    Visit Number 1   Number of Visits 13   Date for PT Re-Evaluation 04/24/16   PT Start Time [id number]   PT Stop Time 1435   PT Time Calculation (min) 43 min   Activity Tolerance Patient tolerated treatment well   Behavior During Therapy [facility] for tasks assessed/performed      Past Medical History:  Diagnosis Date  . Anemia   . Anxiety    takes Klonopin at bedtime  . Arthritis    takes Methotrexate weekly and Prednisone  . Blood transfusion    no abnormal reaction noted  . Cataracts, bilateral    immature  . Complication of anesthesia    wakes up during surgery  . Depression   . Diabetes mellitus without complication ([place])   . Fibromyalgia    takes Lyrica daily  . GERD (gastroesophageal reflux disease)    takes Nexium daily  . Glaucoma   . Hard of hearing   . Heart murmur   . Histoplasmosis   . History of colon polyps   . History of gout   . History of hiatal hernia   . History of shingles   . Hyperlipidemia   . Hypertension    takes Amlodipine,Metoprolol,and Losartan daily  . Incontinence of bowel   . Interstitial cystitis   . Joint pain   . Nocturia   . Peripheral neuropathy ([place])   . PONV (postoperative nausea and vomiting)   . Ulcer ([place]) 1970   duodenal  . Urinary frequency   . Urinary urgency   . Weakness    numbness in both hands and feet    Past Surgical History:  Procedure Laterality Date  . ABDOMINAL HYSTERECTOMY    . accupuncture  3 yrs ago  . ANKLE FUSION Left 05/10/2014   Procedure: LEFT ANKLE ARTHRODESIS ;  Surgeon: Wylene Simmer, MD;  Location: [facility];  Service: Orthopedics;   Laterality: Left;  . ANKLE SURGERY Left   . arm surgery Left    radius  . bladder tacked     . CERVICAL FUSION    . CHOLECYSTECTOMY    . COLONOSCOPY    . CYSTOCELE REPAIR    . DILATION AND CURETTAGE OF UTERUS    . ESOPHAGOGASTRODUODENOSCOPY    . PORT A CATH REVISION N/A 12/27/2015   Procedure: PORT A CATH REVISION;  Surgeon: Rolm Bookbinder, MD;  Location: [facility];  Service: General;  Laterality: N/A;  . PORTACATH PLACEMENT Right 12/27/2015   Procedure: INSERTION PORT-A-CATH WITH ULTRASOUND ;  Surgeon: Rolm Bookbinder, MD;  Location: [facility];  Service: General;  Laterality: Right;  . RADIOACTIVE SEED GUIDED MASTECTOMY WITH AXILLARY SENTINEL LYMPH NODE BIOPSY Left 11/14/2015   Procedure: RADIOACTIVE SEED GUIDED LEFT BREAST LUMPECTOMY WITH AXILLARY SENTINEL LYMPH NODE BIOPSY;  Surgeon: Rolm Bookbinder, MD;  Location: [facility];  Service: General;  Laterality: Left;  RADIOACTIVE SEED GUIDED LEFT BREAST LUMPECTOMY WITH AXILLARY SENTINEL LYMPH NODE BIOPSY  . SALPINGOOPHORECTOMY Bilateral   . TOTAL KNEE ARTHROPLASTY Right 02/28/2015   Procedure: RIGHT TOTAL KNEE ARTHROPLASTY;  Surgeon: Rod Can, MD;  Location: [facility];  Service:  Orthopedics;  Laterality: Right;  . UPPER GASTROINTESTINAL ENDOSCOPY    . VAGINAL HYSTERECTOMY      There were no vitals filed for this visit.       Subjective Assessment - 03/11/16 1359    Subjective "I am unsteady and it bothers me. I walk like a drunken sailor and I am not very sure about my ambulation. The chemo has zapped my strength and I feel weak." Reports her fingers hurt and tingle, only periodically in her feet.   Pertinent History Patient diagnosed with triple negative invasive ductal carcinoma of left breast s/p left lumpectomy and sentinel lymph node biopsy on 11/14/15. She began chemotherapy on 12/20/15 and finishes that on 03/13/16; then will begin radiation after.    Patient Stated Goals to  start walking better, be able to walk without aid   Currently in Pain? Yes   Pain Score 10-Worst pain ever   Pain Location Toe (Comment which one)  big toe   Pain Orientation Right   Pain Descriptors / Indicators Other (Comment)  continuous   Pain Type Acute pain   Pain Onset Today   Aggravating Factors  shoe being tight   Pain Relieving Factors unknown   Multiple Pain Sites Yes   Pain Score 8   Pain Location Back   Pain Orientation Lower   Pain Descriptors / Indicators Aching   Pain Type Acute pain   Pain Onset Other (comment)  since starting chemo almost 12 weeks ago   Pain Frequency --  periodic   Aggravating Factors  nothing   Pain Relieving Factors nothing            [facility] PT Assessment - 03/11/16 0001      Assessment   Medical Diagnosis left breast cancer   Referring Provider Truitt Merle, MD   Onset Date/Surgical Date 11/14/15     Precautions   Precaution Comments cancer precautions     Restrictions   Weight Bearing Restrictions No     Balance Screen   Has the patient fallen in the past 6 months No   Has the patient had a decrease in activity level because of a fear of falling?  Yes   Is the patient reluctant to leave their home because of a fear of falling?  No     Home Environment   Living Environment Private residence   Living Arrangements Children  daughter   Type of Home Apartment   Home Layout One level     Prior Function   Level of Independence Independent with basic ADLs;Independent with household mobility without device;Independent with community mobility with device  uses SPC because she feels weak   Vocation Retired   Leisure was using a stationary bike, but she had a lot of muscle cramps using it; walks around apartment complex using rolling walker only about 2 times in the last month     Cognition   Overall Cognitive Status Within Functional Limits for tasks assessed     Sensation   Light Touch Appears Intact  plantar surfaces of both  feet     Posture/Postural Control   Posture/Postural Control Postural limitations   Postural Limitations Rounded Shoulders;Forward head     ROM / Strength   AROM / PROM / Strength Strength     Strength   Strength Assessment Site Hip;Knee;Ankle   Right/Left Hip Right;Left   Right Hip Flexion 4/5   Right Hip Extension 3-/5   Right Hip ABduction 3+/5   Left Hip  Flexion 4+/5   Left Hip Extension 3-/5   Left Hip ABduction 4+/5   Right/Left Knee Right;Left   Right Knee Flexion 4/5   Right Knee Extension 5/5   Left Knee Flexion 4+/5   Left Knee Extension 5/5   Right/Left Ankle Right;Left   Right Ankle Dorsiflexion 5/5   Right Ankle Plantar Flexion 5/5  in sitting   Left Ankle Dorsiflexion 5/5   Left Ankle Plantar Flexion 5/5  in sitting; limited ROM from h/o fusion     Ambulation/Gait   Ambulation/Gait Yes   Ambulation/Gait Assistance 6: Modified independent (Device/Increase time)   Assistive device Straight cane   Gait Pattern Step-through pattern;Poor foot clearance - left;Poor foot clearance - right;Trendelenburg  right hip drop during left stance phase   Ambulation Surface Level   Gait velocity 0.6 m/s  able to ambulate 5 meters in 8.28 seconds     Balance   Balance Assessed Yes     Static Standing Balance   Static Standing - Comment/# of Minutes Static Standing on level surface eyes closed - 30 seconds independently; static standing feet together on level surface eyes open - 30 seconds supervision; static standing feet together on level surface with eyes closed - 30 seconds stand-by assistance; static standing eyes open on level surface with head turns - 30 seconds supervision                              Short Term Clinic Goals - 03/11/16 1502      CC Short Term Goal  #1   Title Patient will achieve bilateral hip extensor strength of at least 4/5 for improved safety with ambulation.   Time 3   Period Weeks   Status New     CC Short Term  Goal  #2   Title Patient will achieve right hip abduction strength of at least 4/5 for improved safety with ambulation.   Time 4   Period Weeks   Status New     CC Short Term Goal  #3   Title Patient will be able to stand with feet together with eyes open for at least 30 seconds independently for improved safety with ADLs.   Time 3   Period Weeks   Status New             Long Term Clinic Goals - 03/11/16 1505      CC Long Term Goal  #1   Title Patient will improve gait speed by 0.1 m/s for improved safety with ambulation.   Time 6   Period Weeks   Status New     CC Long Term Goal  #2   Title Patient will be independent with HEP for LE strengthening.   Time 6   Period Weeks   Status New     CC Long Term Goal  #3   Title Patient will be able to ambulate 250 ft on level surface without assistive device independently for improved safety with community ambulation.   Time 6   Period Weeks   Status New            Plan - 03/11/16 1455    Clinical Impression Statement Patient is a 82 year old female s/p left lumpectomy and sentinel lymph node biopsy on 11/14/15 who will finish up chemotherapy on 03/13/16. She presents with complaints of gait instability, muscle weakness, and neuropathy resultant from chemotherapy. She demonstrates deceased bilateral LE strength,  gait deviations, decreased gait speed, and decreased standing balance. She would benefit from PT to address these issues for improved safety with ADLs and walking.   Rehab Potential Good   Clinical Impairments Affecting Rehab Potential current chemotherapy and will soon begin radiation   PT Frequency 2x / week   PT Duration 6 weeks   PT Treatment/Interventions ADLs/Self Care Home Management;Electrical Stimulation;Moist Heat;Neuromuscular re-education;Balance training;Therapeutic exercise;Therapeutic activities;Gait training;DME Instruction;Patient/family education;Manual techniques;Manual lymph drainage;Passive range  of motion;Taping   PT Next Visit Plan Begin LE strengthening and balance exercises and gait training; possibly try NuStep; further balance assessment   Consulted and Agree with Plan of Care Patient      Patient will benefit from skilled therapeutic intervention in order to improve the following deficits and impairments:  Decreased balance, Decreased strength, Difficulty walking, Postural dysfunction, Pain, Abnormal gait  Visit Diagnosis: Difficulty in walking, not elsewhere classified  Unsteadiness on feet  Other symptoms and signs involving the musculoskeletal system     Problem List Patient Active Problem List   Diagnosis Date Noted  . Anemia due to antineoplastic chemotherapy 03/06/2016  . Hypersensitivity reaction 01/07/2016  . Port catheter in place 01/03/2016  . Malignant neoplasm of upper outer quadrant of female breast ([place]) 10/30/2015  . Primary osteoarthritis of right knee 02/28/2015  . Post-traumatic arthritis of ankle 05/10/2014  . MGUS (monoclonal gammopathy of unknown significance) 02/17/2012  . Diverticulosis of colon (without mention of hemorrhage) 01/21/2011  . Family history of malignant neoplasm of gastrointestinal tract 01/21/2011  . Special screening for malignant neoplasms, colon 01/21/2011  . Other symptoms involving digestive system(787.99) 01/21/2011    Mellody Life 03/11/2016, 3:08 PM  [facility] [place], [name], [address] Phone: [phone]   Fax:  [phone]  Name: Julie [name] MRN: [id number] Date of Birth: 1932-11-03  Saverio Danker, SPT  This entire session was guided, instructed, and directly supervised by Serafina Royals, PT.  Read, reviewed, edited and agree with student's findings and recommendations.   Serafina Royals, PT 03/11/16 3:59 PM

## 2016-03-12 ENCOUNTER — Telehealth: Payer: Self-pay | Admitting: Hematology

## 2016-03-12 DIAGNOSIS — M25511 Pain in right shoulder: Secondary | ICD-10-CM | POA: Diagnosis not present

## 2016-03-12 DIAGNOSIS — M19011 Primary osteoarthritis, right shoulder: Secondary | ICD-10-CM | POA: Diagnosis not present

## 2016-03-12 DIAGNOSIS — M542 Cervicalgia: Secondary | ICD-10-CM | POA: Diagnosis not present

## 2016-03-12 DIAGNOSIS — M47816 Spondylosis without myelopathy or radiculopathy, lumbar region: Secondary | ICD-10-CM | POA: Diagnosis not present

## 2016-03-12 NOTE — Telephone Encounter (Signed)
Received call around 10:13AM from patient regarding her 10/20 appointment. The patient believed that she was to see the MD on 10/20 along with her other scheduled appointments for that day. Per the patient, she has a lump near her port that needs to be seen.

## 2016-03-12 NOTE — Progress Notes (Signed)
Location of Breast Cancer: Malignant neoplasm of left upper outer quadrant of female breast   Histology per Pathology Report:   11/14/15 Diagnosis 1. Breast, lumpectomy, Left - INVASIVE DUCTAL CARCINOMA, GRADE III/III, SPANNING 1.1 CM. - THE RESECTION MARGINS ARE NEGATIVE FOR CARCINOMA. - SEE ONCOLOGY TABLE BELOW. 2. Lymph node, sentinel, biopsy, Left Axillary - BENIGN BREAST PARENCHYMA. - LYMPH NODAL TISSUE IS NOT IDENTIFIED. - THERE IS NO EVIDENCE OF MALIGNANCY.  10/22/15 Diagnosis Breast, left, needle core biopsy, 1:00 o'clock - INVASIVE DUCTAL CARCINOMA. - SEE COMMENT.  Receptor Status: ER(0%), PR (0%), Her2-neu (neg), Ki-(80%)  Did patient present with symptoms (if so, please note symptoms) or was this found on screening mammography?: screening mammogram  Past/Anticipated interventions by surgeon, if any: 11/14/15 - Procedure: RADIOACTIVE SEED GUIDED LEFT BREAST LUMPECTOMY WITH AXILLARY SENTINEL LYMPH NODE BIOPSY;  Surgeon: Rolm Bookbinder, MD  Past/Anticipated interventions by medical oncology, if any: weekly Taxol '80mg'$ /m2, started on 12/20/2015, changed to Abraxane '100mg'$ /m2 weekly from week 3 due to infusion reaction, and dose reduction from week 9 due to neuropathy  Lymphedema issues, if any:  no  Pain issues, if any:  no   OB Gyn history: Menarchal: 16, LMP: 49 (hystrectomy), Contraceptive: 2, HRT: 1 yr, G5P5: 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  Her mother had breast cancer and her sister had colon cancer.   SAFETY ISSUES:  Prior radiation? no  Pacemaker/ICD? no  Possible current pregnancy?no  Is the patient on methotrexate? No - last injection was 3-4 months ago.  Current Complaints / other details:  Patient has a history of MGUS and is seeing Dr. Julien Nordmann.  Patient is here with her daughter.    BP 134/71 (BP Location: Right Wrist, Patient Position: Sitting)   Pulse 68   Temp 98 F (36.7 C) (Oral)   Ht '5\' 3"'$  (1.6 m)   Wt 219 lb 12.8 oz (99.7 kg)   SpO2  100%   BMI 38.94 kg/m    Wt Readings from Last 3 Encounters:  03/18/16 219 lb 12.8 oz (99.7 kg)  03/06/16 218 lb (98.9 kg)  02/28/16 219 lb 12.8 oz (99.7 kg)    Orman Matsumura, Craige Cotta, RN 03/12/2016,8:53 AM

## 2016-03-13 ENCOUNTER — Other Ambulatory Visit (HOSPITAL_BASED_OUTPATIENT_CLINIC_OR_DEPARTMENT_OTHER): Payer: Medicare Other

## 2016-03-13 ENCOUNTER — Ambulatory Visit (HOSPITAL_BASED_OUTPATIENT_CLINIC_OR_DEPARTMENT_OTHER): Payer: Medicare Other

## 2016-03-13 ENCOUNTER — Other Ambulatory Visit: Payer: Self-pay | Admitting: Hematology

## 2016-03-13 VITALS — BP 125/89 | HR 63 | Temp 98.1°F | Resp 16

## 2016-03-13 DIAGNOSIS — C50412 Malignant neoplasm of upper-outer quadrant of left female breast: Secondary | ICD-10-CM

## 2016-03-13 DIAGNOSIS — Z5111 Encounter for antineoplastic chemotherapy: Secondary | ICD-10-CM

## 2016-03-13 LAB — CBC WITH DIFFERENTIAL/PLATELET
BASO%: 2.5 % — AB (ref 0.0–2.0)
Basophils Absolute: 0.1 10*3/uL (ref 0.0–0.1)
EOS%: 2.3 % (ref 0.0–7.0)
Eosinophils Absolute: 0.1 10*3/uL (ref 0.0–0.5)
HCT: 30.9 % — ABNORMAL LOW (ref 34.8–46.6)
HGB: 10 g/dL — ABNORMAL LOW (ref 11.6–15.9)
LYMPH%: 30.6 % (ref 14.0–49.7)
MCH: 27.6 pg (ref 25.1–34.0)
MCHC: 32.5 g/dL (ref 31.5–36.0)
MCV: 85.1 fL (ref 79.5–101.0)
MONO#: 0.3 10*3/uL (ref 0.1–0.9)
MONO%: 10.6 % (ref 0.0–14.0)
NEUT#: 1.7 10*3/uL (ref 1.5–6.5)
NEUT%: 54 % (ref 38.4–76.8)
PLATELETS: 269 10*3/uL (ref 145–400)
RBC: 3.63 10*6/uL — AB (ref 3.70–5.45)
RDW: 18.2 % — ABNORMAL HIGH (ref 11.2–14.5)
WBC: 3.1 10*3/uL — ABNORMAL LOW (ref 3.9–10.3)
lymph#: 0.9 10*3/uL (ref 0.9–3.3)

## 2016-03-13 LAB — COMPREHENSIVE METABOLIC PANEL
ALT: 9 U/L (ref 0–55)
ANION GAP: 11 meq/L (ref 3–11)
AST: 19 U/L (ref 5–34)
Albumin: 3.6 g/dL (ref 3.5–5.0)
Alkaline Phosphatase: 67 U/L (ref 40–150)
BUN: 30.1 mg/dL — ABNORMAL HIGH (ref 7.0–26.0)
CHLORIDE: 108 meq/L (ref 98–109)
CO2: 22 meq/L (ref 22–29)
CREATININE: 1.1 mg/dL (ref 0.6–1.1)
Calcium: 9.4 mg/dL (ref 8.4–10.4)
EGFR: 57 mL/min/{1.73_m2} — AB (ref >90)
Glucose: 78 mg/dl (ref 70–140)
Potassium: 3.9 mEq/L (ref 3.5–5.1)
Sodium: 142 mEq/L (ref 136–145)
Total Bilirubin: 0.55 mg/dL (ref 0.20–1.20)
Total Protein: 6.8 g/dL (ref 6.4–8.3)

## 2016-03-13 MED ORDER — FAMOTIDINE IN NACL 20-0.9 MG/50ML-% IV SOLN
INTRAVENOUS | Status: AC
  Filled 2016-03-13: qty 50

## 2016-03-13 MED ORDER — SODIUM CHLORIDE 0.9 % IV SOLN
Freq: Once | INTRAVENOUS | Status: AC
  Administered 2016-03-13: 09:00:00 via INTRAVENOUS

## 2016-03-13 MED ORDER — FAMOTIDINE IN NACL 20-0.9 MG/50ML-% IV SOLN
20.0000 mg | Freq: Once | INTRAVENOUS | Status: AC
  Administered 2016-03-13: 20 mg via INTRAVENOUS

## 2016-03-13 MED ORDER — ONDANSETRON HCL 8 MG PO TABS
ORAL_TABLET | ORAL | Status: AC
  Filled 2016-03-13: qty 1

## 2016-03-13 MED ORDER — PACLITAXEL PROTEIN-BOUND CHEMO INJECTION 100 MG
40.0000 mg/m2 | Freq: Once | INTRAVENOUS | Status: AC
  Administered 2016-03-13: 75 mg via INTRAVENOUS
  Filled 2016-03-13: qty 15

## 2016-03-13 MED ORDER — ONDANSETRON HCL 8 MG PO TABS
8.0000 mg | ORAL_TABLET | Freq: Once | ORAL | Status: AC
  Administered 2016-03-13: 8 mg via ORAL

## 2016-03-13 NOTE — Patient Instructions (Signed)
[facility] Discharge Instructions for Patients Receiving Chemotherapy  Today you received the following chemotherapy agents: Abraxane   To help prevent nausea and vomiting after your treatment, we encourage you to take your nausea medication as directed.    If you develop nausea and vomiting that is not controlled by your nausea medication, call the clinic.   BELOW ARE SYMPTOMS THAT SHOULD BE REPORTED IMMEDIATELY:  *FEVER GREATER THAN 100.5 F  *CHILLS WITH OR WITHOUT FEVER  NAUSEA AND VOMITING THAT IS NOT CONTROLLED WITH YOUR NAUSEA MEDICATION  *UNUSUAL SHORTNESS OF BREATH  *UNUSUAL BRUISING OR BLEEDING  TENDERNESS IN MOUTH AND THROAT WITH OR WITHOUT PRESENCE OF ULCERS  *URINARY PROBLEMS  *BOWEL PROBLEMS  UNUSUAL RASH Items with * indicate a potential emergency and should be followed up as soon as possible.  Feel free to call the clinic you have any questions or concerns. The clinic phone number is [phone].  Please show the CHEMO ALERT CARD at check-in to the Emergency Department and triage nurse.   

## 2016-03-16 ENCOUNTER — Other Ambulatory Visit: Payer: Self-pay | Admitting: General Surgery

## 2016-03-16 ENCOUNTER — Ambulatory Visit: Payer: Medicare Other | Admitting: Radiation Oncology

## 2016-03-16 ENCOUNTER — Ambulatory Visit: Payer: Medicare Other

## 2016-03-16 DIAGNOSIS — R2681 Unsteadiness on feet: Secondary | ICD-10-CM | POA: Diagnosis not present

## 2016-03-16 DIAGNOSIS — R29898 Other symptoms and signs involving the musculoskeletal system: Secondary | ICD-10-CM

## 2016-03-16 DIAGNOSIS — R262 Difficulty in walking, not elsewhere classified: Secondary | ICD-10-CM | POA: Diagnosis not present

## 2016-03-16 DIAGNOSIS — M6281 Muscle weakness (generalized): Secondary | ICD-10-CM

## 2016-03-16 NOTE — Progress Notes (Signed)
Surgery on 03/25/2016,.  Need orders in [facility]PI[facility]. Thank You

## 2016-03-16 NOTE — Therapy (Signed)
[place], [name], [address] Phone: [phone]   Fax:  [phone]  Physical Therapy Treatment  Patient Details  Name: Julie [name] MRN: [id number] Date of Birth: 11-26-1932 Referring Provider: Truitt Merle, MD  Encounter Date: 03/16/2016      PT End of Session - 03/16/16 1032    Visit Number 2   Number of Visits 13   Date for PT Re-Evaluation 04/24/16   PT Start Time 0935   PT Stop Time 1024   PT Time Calculation (min) 49 min   Activity Tolerance Patient tolerated treatment well   Behavior During Therapy [facility] for tasks assessed/performed      Past Medical History:  Diagnosis Date  . Anemia   . Anxiety    takes Klonopin at bedtime  . Arthritis    takes Methotrexate weekly and Prednisone  . Blood transfusion    no abnormal reaction noted  . Cataracts, bilateral    immature  . Complication of anesthesia    wakes up during surgery  . Depression   . Diabetes mellitus without complication ([place])   . Fibromyalgia    takes Lyrica daily  . GERD (gastroesophageal reflux disease)    takes Nexium daily  . Glaucoma   . Hard of hearing   . Heart murmur   . Histoplasmosis   . History of colon polyps   . History of gout   . History of hiatal hernia   . History of shingles   . Hyperlipidemia   . Hypertension    takes Amlodipine,Metoprolol,and Losartan daily  . Incontinence of bowel   . Interstitial cystitis   . Joint pain   . Nocturia   . Peripheral neuropathy ([place])   . PONV (postoperative nausea and vomiting)   . Ulcer ([place]) 1970   duodenal  . Urinary frequency   . Urinary urgency   . Weakness    numbness in both hands and feet    Past Surgical History:  Procedure Laterality Date  . ABDOMINAL HYSTERECTOMY    . accupuncture  3 yrs ago  . ANKLE FUSION Left 05/10/2014   Procedure: LEFT ANKLE ARTHRODESIS ;  Surgeon: Wylene Simmer, MD;  Location: [place];  Service: Orthopedics;   Laterality: Left;  . ANKLE SURGERY Left   . arm surgery Left    radius  . bladder tacked     . CERVICAL FUSION    . CHOLECYSTECTOMY    . COLONOSCOPY    . CYSTOCELE REPAIR    . DILATION AND CURETTAGE OF UTERUS    . ESOPHAGOGASTRODUODENOSCOPY    . PORT A CATH REVISION N/A 12/27/2015   Procedure: PORT A CATH REVISION;  Surgeon: Rolm Bookbinder, MD;  Location: [place];  Service: General;  Laterality: N/A;  . PORTACATH PLACEMENT Right 12/27/2015   Procedure: INSERTION PORT-A-CATH WITH ULTRASOUND ;  Surgeon: Rolm Bookbinder, MD;  Location: [place];  Service: General;  Laterality: Right;  . RADIOACTIVE SEED GUIDED MASTECTOMY WITH AXILLARY SENTINEL LYMPH NODE BIOPSY Left 11/14/2015   Procedure: RADIOACTIVE SEED GUIDED LEFT BREAST LUMPECTOMY WITH AXILLARY SENTINEL LYMPH NODE BIOPSY;  Surgeon: Rolm Bookbinder, MD;  Location: [place];  Service: General;  Laterality: Left;  RADIOACTIVE SEED GUIDED LEFT BREAST LUMPECTOMY WITH AXILLARY SENTINEL LYMPH NODE BIOPSY  . SALPINGOOPHORECTOMY Bilateral   . TOTAL KNEE ARTHROPLASTY Right 02/28/2015   Procedure: RIGHT TOTAL KNEE ARTHROPLASTY;  Surgeon: Rod Can, MD;  Location[facility];  Service:  Orthopedics;  Laterality: Right;  . UPPER GASTROINTESTINAL ENDOSCOPY    . VAGINAL HYSTERECTOMY      There were no vitals filed for this visit.      Subjective Assessment - 03/16/16 0939    Subjective I'm doing okay, no changes from my evaluation last week.    Pertinent History Patient diagnosed with triple negative invasive ductal carcinoma of left breast s/p left lumpectomy and sentinel lymph node biopsy on 11/14/15. She began chemotherapy on 12/20/15 and finishes that on 03/13/16; then will begin radiation after.    Patient Stated Goals to start walking better, be able to walk without aid   Currently in Pain? Yes   Pain Score 10-Worst pain ever   Pain Location Toe (Comment which one)  bil great toe   Pain  Orientation Right;Left   Pain Descriptors / Indicators Constant   Pain Type Acute pain   Pain Onset Today   Aggravating Factors  covered shoes   Pain Relieving Factors nothing as of yet                         [facility] Adult PT Treatment/Exercise - 03/16/16 0001      Static Standing Balance   Single Leg Stance - Right Leg --  In // bars 30 second holds with +1 UE support   Single Leg Stance - Left Leg --  In // bars 30 second hold with +1 UE support   Tandem Stance - Right Leg --  In // bars 30 second hold minimal UE support   Tandem Stance - Left Leg --  In // bars 30 second holds minimal UE support     High Level Balance   High Level Balance Activities Tandem walking   High Level Balance Comments 2 sets in // bars with moderate UE support, very challenging for pt     Knee/Hip Exercises: Aerobic   Nustep Level 3 for 10 minutes with therapist present to assess pt throughout. No c/o increase pain or CIPN symptoms after     Knee/Hip Exercises: Seated   Long Arc Quad Strengthening;Both;1 set;10 reps  5 seconds   Ball Squeeze 20 times   Clamshell with TheraBand Red  20 times   Marching Strengthening;Both;10 reps     Knee/Hip Exercises: Supine   Short Arc Quad Sets Strengthening;Both;10 reps  Hold 5 seconds   Bridges Strengthening;Both;10 reps   Straight Leg Raises Strengthening;Both;10 reps                PT Education - 03/16/16 1040    Education provided Yes   Education Details Bil LE strength in sitting and supine   Person(s) Educated Patient   Methods Explanation;Demonstration;Handout;Verbal cues   Comprehension Verbalized understanding;Returned demonstration;Need further instruction;Verbal cues required           Short Term Clinic Goals - 03/11/16 1502      CC Short Term Goal  #1   Title Patient will achieve bilateral hip extensor strength of at least 4/5 for improved safety with ambulation.   Time 3   Period Weeks   Status New      CC Short Term Goal  #2   Title Patient will achieve right hip abduction strength of at least 4/5 for improved safety with ambulation.   Time 4   Period Weeks   Status New     CC Short Term Goal  #3   Title Patient will be able to stand with  feet together with eyes open for at least 30 seconds independently for improved safety with ADLs.   Time 3   Period Weeks   Status New             Long Term Clinic Goals - 03/11/16 1505      CC Long Term Goal  #1   Title Patient will improve gait speed by 0.1 m/s for improved safety with ambulation.   Time 6   Period Weeks   Status New     CC Long Term Goal  #2   Title Patient will be independent with HEP for LE strengthening.   Time 6   Period Weeks   Status New     CC Long Term Goal  #3   Title Patient will be able to ambulate 250 ft on level surface without assistive device independently for improved safety with community ambulation.   Time 6   Period Weeks   Status New            Plan - 03/16/16 1033    Clinical Impression Statement Pt tolerated first session of exercise and balance activities well reporting no increase pain after and only minimal LE fatigue. Issued initial HEP today for LE strengthening in sitting and supine. Pt did well with all of these returning good demonstration. Began balance activities at end of session in standing in // bars which were challenging for pt and she will benefit from continued focused on high level balance activities and bil LE strength.    Rehab Potential Good   Clinical Impairments Affecting Rehab Potential current chemotherapy and will soon begin radiation   PT Frequency 2x / week   PT Duration 6 weeks   PT Treatment/Interventions ADLs/Self Care Home Management;Electrical Stimulation;Moist Heat;Neuromuscular re-education;Balance training;Therapeutic exercise;Therapeutic activities;Gait training;DME Instruction;Patient/family education;Manual techniques;Manual lymph drainage;Passive  range of motion;Taping   PT Next Visit Plan Assess goals next visit to include bil hip extensor and Rt abd strength and timing narrow BOS support stance with eyes open. Review HEP issued today progressing with ankle weights as able. Cont balance exercises, gait training, and NuStep; further balance assessment.    PT Home Exercise Plan see education section   Consulted and Agree with Plan of Care Patient      Patient will benefit from skilled therapeutic intervention in order to improve the following deficits and impairments:  Decreased balance, Decreased strength, Difficulty walking, Postural dysfunction, Pain, Abnormal gait  Visit Diagnosis: Difficulty in walking, not elsewhere classified  Unsteadiness on feet  Other symptoms and signs involving the musculoskeletal system  Muscle weakness (generalized)     Problem List Patient Active Problem List   Diagnosis Date Noted  . Anemia due to antineoplastic chemotherapy 03/06/2016  . Hypersensitivity reaction 01/07/2016  . Port catheter in place 01/03/2016  . Malignant neoplasm of upper outer quadrant of female breast ([place]) 10/30/2015  . Primary osteoarthritis of right knee 02/28/2015  . Post-traumatic arthritis of ankle 05/10/2014  . MGUS (monoclonal gammopathy of unknown significance) 02/17/2012  . Diverticulosis of colon (without mention of hemorrhage) 01/21/2011  . Family history of malignant neoplasm of gastrointestinal tract 01/21/2011  . Special screening for malignant neoplasms, colon 01/21/2011  . Other symptoms involving digestive system(787.99) 01/21/2011    Otelia Limes, PTA 03/16/2016, 10:48 AM  [place], [name], [address] Phone: [phone]   Fax:  [phone]  Name: Julie [name] MRN: [id number] Date of Birth: 01-23-1933

## 2016-03-16 NOTE — Patient Instructions (Signed)
KNEE: Extension, Long Arc Quads - Sitting    Raise leg until knee is straight. _10__ reps per set, hold for 5 seconds each,  _2-3__ sets per day.  Copyright  VHI. All rights reserved.  Hip (Front)    Begin sitting tall, both feet flat on floor. Inhale, then exhale while lifting knee as high as is comfortable, keeping upper body straight and still. Slowly return to starting position. Repeat __10__ times each leg. Do _1-2___ sets per session. Do _2-3___ sessions per day.  Copyright  VHI. All rights reserved.   Adduction: Hip - Knees Together (Sitting)    Sit with towel roll between knees. Push knees together. Hold for _5__ seconds. Repeat _20__ times. Do _2__ times a day.  Copyright  VHI. All rights reserved.   ABDUCTION: Sitting - Resistance Band (Active)    Sit with feet flat. Open legs to past shoulder width holding for 5 seconds, then slowly bring knees back together. Complete  _20__ repetitions. Perform _2-3__ sessions per day.  Copyright  VHI. All rights reserved.   Bridging    Slowly raise buttocks from floor, keeping stomach tight. Then lower back to bed in a controlled manner. Repeat __10__ times per set holding for 3-5 seconds. Do _1-2___ sets per session. Do _2___ sessions per day.  http://orth.exer.us/1097   Copyright  VHI. All rights reserved.   Straight Leg Raise    Tighten stomach and slowly raise locked right leg to height of Lt knee, then slowly lower back to bed. Then switch legs.  Repeat _10___ times per set. Do _1-2___ sets per sessions. Do _2-3___ sessions per day.  http://orth.exer.us/1103   Copyright  VHI. All rights reserved.   Short Arc [employer] a large can or rolled towel under leg. Straighten knee and leg. Hold __5__ seconds. Repeat with other leg. Repeat _10-20___ times. Do __2-3__ sessions per day.  http://gt2.exer.us/366   Copyright  VHI. All rights reserved.

## 2016-03-17 ENCOUNTER — Encounter (HOSPITAL_COMMUNITY): Payer: Self-pay | Admitting: *Deleted

## 2016-03-18 ENCOUNTER — Ambulatory Visit
Admission: RE | Admit: 2016-03-18 | Discharge: 2016-03-18 | Disposition: A | Discharge status: Home / Self Care | Payer: Medicare Other | Source: Ambulatory Visit | Attending: Radiation Oncology | Admitting: Radiation Oncology

## 2016-03-18 ENCOUNTER — Encounter: Payer: Self-pay | Admitting: Radiation Oncology

## 2016-03-18 ENCOUNTER — Ambulatory Visit: Payer: Medicare Other

## 2016-03-18 ENCOUNTER — Telehealth: Payer: Self-pay | Admitting: *Deleted

## 2016-03-18 VITALS — BP 134/71 | HR 68 | Temp 98.0°F | Ht 63.0 in | Wt 219.8 lb

## 2016-03-18 DIAGNOSIS — R2681 Unsteadiness on feet: Secondary | ICD-10-CM

## 2016-03-18 DIAGNOSIS — M6281 Muscle weakness (generalized): Secondary | ICD-10-CM

## 2016-03-18 DIAGNOSIS — Z9221 Personal history of antineoplastic chemotherapy: Secondary | ICD-10-CM | POA: Diagnosis not present

## 2016-03-18 DIAGNOSIS — Z510 Encounter for antineoplastic radiation therapy: Secondary | ICD-10-CM | POA: Diagnosis not present

## 2016-03-18 DIAGNOSIS — Z171 Estrogen receptor negative status [ER-]: Principal | ICD-10-CM

## 2016-03-18 DIAGNOSIS — R262 Difficulty in walking, not elsewhere classified: Secondary | ICD-10-CM | POA: Diagnosis not present

## 2016-03-18 DIAGNOSIS — R29898 Other symptoms and signs involving the musculoskeletal system: Secondary | ICD-10-CM | POA: Diagnosis not present

## 2016-03-18 DIAGNOSIS — C50412 Malignant neoplasm of upper-outer quadrant of left female breast: Secondary | ICD-10-CM

## 2016-03-18 NOTE — Therapy (Signed)
[facility] [address] [place], [place], [address] Phone: [phone]   Fax:  [phone]  Physical Therapy Treatment  Patient Details  Name: Julie [name] MRN: [id number] Date of Birth: [birth year] Referring Provider: Malachy Mood, MD  Encounter Date: 03/18/2016      PT End of Session - 03/18/16 1106    Visit Number 3   Number of Visits 13   Date for PT Re-Evaluation 04/24/16   PT Start Time 1018   PT Stop Time 1103   PT Time Calculation (min) 45 min   Activity Tolerance Patient tolerated treatment well   Behavior During Therapy [facility] for tasks assessed/performed      Past Medical History:  Diagnosis Date  . Anemia   . Anxiety    takes Klonopin at bedtime  . Arthritis    takes Methotrexate weekly and Prednisone  . Blood transfusion    no abnormal reaction noted  . Cataracts, bilateral    immature  . Complication of anesthesia    wakes up during surgery  . Depression   . Diabetes mellitus without complication ([facility])   . Dyspnea    with exertion since chemo   . Fibromyalgia    takes Lyrica daily  . GERD (gastroesophageal reflux disease)    takes Nexium daily  . Glaucoma   . Hard of hearing   . Heart murmur   . Histoplasmosis   . History of colon polyps   . History of gout   . History of hiatal hernia   . History of shingles   . Hyperlipidemia   . Hypertension    takes Amlodipine,Metoprolol,and Losartan daily  . Incontinence of bowel   . Interstitial cystitis   . Joint pain   . Nocturia   . Peripheral neuropathy ([facility])   . PONV (postoperative nausea and vomiting)   . Ulcer ([facility]) 1970   duodenal  . Urinary frequency   . Urinary urgency   . Weakness    numbness in both hands and feet    Past Surgical History:  Procedure Laterality Date  . ABDOMINAL HYSTERECTOMY    . accupuncture  3 yrs ago  . ANKLE FUSION Left 05/10/2014   Procedure: LEFT ANKLE ARTHRODESIS ;  Surgeon: Toni Arthurs, MD;   Location: [facility];  Service: Orthopedics;  Laterality: Left;  . ANKLE SURGERY Left   . arm surgery Left    radius  . bladder tacked     . CERVICAL FUSION    . CHOLECYSTECTOMY    . COLONOSCOPY    . CYSTOCELE REPAIR    . DILATION AND CURETTAGE OF UTERUS    . ESOPHAGOGASTRODUODENOSCOPY    . PORT A CATH REVISION N/A 12/27/2015   Procedure: PORT A CATH REVISION;  Surgeon: Emelia Loron, MD;  Location: [facility];  Service: General;  Laterality: N/A;  . PORTACATH PLACEMENT Right 12/27/2015   Procedure: INSERTION PORT-A-CATH WITH ULTRASOUND ;  Surgeon: Emelia Loron, MD;  Location: [facility];  Service: General;  Laterality: Right;  . RADIOACTIVE SEED GUIDED MASTECTOMY WITH AXILLARY SENTINEL LYMPH NODE BIOPSY Left 11/14/2015   Procedure: RADIOACTIVE SEED GUIDED LEFT BREAST LUMPECTOMY WITH AXILLARY SENTINEL LYMPH NODE BIOPSY;  Surgeon: Emelia Loron, MD;  Location: [facility];  Service: General;  Laterality: Left;  RADIOACTIVE SEED GUIDED LEFT BREAST LUMPECTOMY WITH AXILLARY SENTINEL LYMPH NODE BIOPSY  . SALPINGOOPHORECTOMY Bilateral   . TOTAL KNEE ARTHROPLASTY Right 02/28/2015   Procedure: RIGHT TOTAL KNEE ARTHROPLASTY;  Surgeon: Rod Can, MD;  [facility];  Service: Orthopedics;  Laterality: Right;  . UPPER GASTROINTESTINAL ENDOSCOPY    . VAGINAL HYSTERECTOMY      There were no vitals filed for this visit.      Subjective Assessment - 03/18/16 1025    Subjective I started taking Biotin for the neuropathy symptoms (it's OTC and Dr. Burr Medico suggested it) and B-12 vitamin. I think the Biotin is starting to help because I can make a fist now. I didn't try any of the exercises you gave me Monday. My toes are feeling alot better, only hurt now when something brushes them like the sheet or I try to wear shoes.    Pertinent History Patient diagnosed with triple negative invasive ductal carcinoma of left breast s/p left lumpectomy and sentinel  lymph node biopsy on 11/14/15. She began chemotherapy on 12/20/15 and finishes that on 03/13/16; then will begin radiation after.    Patient Stated Goals to start walking better, be able to walk without aid   Currently in Pain? No/denies            [facility] PT Assessment - 03/18/16 0001      Strength   Right Hip Extension 3+/5   Right Hip ABduction 4-/5   Left Hip Extension 3+/5                     [facility] Adult PT Treatment/Exercise - 03/18/16 0001      Balance   Balance comment In // bars: Crossover in front walking to Rt and Lt 2 times each; static standing with eyes open 30 seconds without UE support, then with eyes closed still with no UE support but with SBA     Static Standing Balance   Single Leg Stance - Right Leg --  30 seconds trial in // bars with min UE support   Single Leg Stance - Left Leg --  30 seconds trial in // bars with min UE support     High Level Balance   High Level Balance Activities Tandem walking   High Level Balance Comments 2 sets in // bars with moderate UE support, very challenging for pt, front and backwards.     Knee/Hip Exercises: Aerobic   Nustep Level 4 for 7 minutes with PTA present to assess pt.     Knee/Hip Exercises: Seated   Long Arc Quad Strengthening;Both;20 reps  2 lbs   Marching Strengthening;Both;10 reps  2 lbs     Knee/Hip Exercises: Supine   Short Arc Quad Sets Strengthening;Both;2 sets;10 reps  2 lbs   Bridges Strengthening;Both;10 reps   Straight Leg Raises Strengthening;Both;10 reps  2 lbs                   Short Term Clinic Goals - 03/18/16 1054      CC Short Term Goal  #1   Title Patient will achieve bilateral hip extensor strength of at least 4/5 for improved safety with ambulation.   Baseline Bil hip 3+/5 03/18/16   Status On-going     CC Short Term Goal  #2   Title Patient will achieve right hip abduction strength of at least 4/5 for improved safety with ambulation.   Baseline In 4-/5  03/18/16   Status On-going     CC Short Term Goal  #3   Title Patient will be able to stand with feet together with eyes open for at least 30 seconds independently for improved safety  with ADLs.   Status Achieved             Long Term Clinic Goals - 03/11/16 1505      CC Long Term Goal  #1   Title Patient will improve gait speed by 0.1 m/s for improved safety with ambulation.   Time 6   Period Weeks   Status New     CC Long Term Goal  #2   Title Patient will be independent with HEP for LE strengthening.   Time 6   Period Weeks   Status New     CC Long Term Goal  #3   Title Patient will be able to ambulate 250 ft on level surface without assistive device independently for improved safety with community ambulation.   Time 6   Period Weeks   Status New            Plan - 03/18/16 1106    Clinical Impression Statement Pt reports she had tolerated the first session well without any increase pain. She did not do the HEP yet so undetermined at this time if pt will be compliant with this. She did well with progression of exercise today to include weights with LE strenghtening and tolerated more balance activities today as well. Her bil LE strength has improved some since eval (see goals) and she has met her STG for balance.   Rehab Potential Good   Clinical Impairments Affecting Rehab Potential current chemotherapy and will soon begin radiation   PT Frequency 2x / week   PT Duration 6 weeks   PT Treatment/Interventions ADLs/Self Care Home Management;Electrical Stimulation;Moist Heat;Neuromuscular re-education;Balance training;Therapeutic exercise;Therapeutic activities;Gait training;DME Instruction;Patient/family education;Manual techniques;Manual lymph drainage;Passive range of motion;Taping   PT Next Visit Plan Cont NuStep. Review HEP prn and see if pt did this at all over weekend. Cont bil LE strength with weights and higher level balance activities. Also cont to encourage pt  with compliance if HEP.   Consulted and Agree with Plan of Care Patient      Patient will benefit from skilled therapeutic intervention in order to improve the following deficits and impairments:  Decreased balance, Decreased strength, Difficulty walking, Postural dysfunction, Pain, Abnormal gait  Visit Diagnosis: Difficulty in walking, not elsewhere classified  Unsteadiness on feet  Other symptoms and signs involving the musculoskeletal system  Muscle weakness (generalized)     Problem List Patient Active Problem List   Diagnosis Date Noted  . Anemia due to antineoplastic chemotherapy 03/06/2016  . Hypersensitivity reaction 01/07/2016  . Port catheter in place 01/03/2016  . Malignant neoplasm of upper outer quadrant of female breast ([place]) 10/30/2015  . Primary osteoarthritis of right knee 02/28/2015  . Post-traumatic arthritis of ankle 05/10/2014  . MGUS (monoclonal gammopathy of unknown significance) 02/17/2012  . Diverticulosis of colon (without mention of hemorrhage) 01/21/2011  . Family history of malignant neoplasm of gastrointestinal tract 01/21/2011  . Special screening for malignant neoplasms, colon 01/21/2011  . Other symptoms involving digestive system(787.99) 01/21/2011    Otelia Limes, PTA 03/18/2016, 11:13 AM  [place], [name], [address] Phone: [phone]   Fax:  [phone]  Name: SHANITHA TWINING MRN: [id number] Date of Birth: [birth year]

## 2016-03-18 NOTE — Progress Notes (Signed)
Radiation Oncology         [phone]) [phone] ________________________________  Initial Outpatient Consultation  Name: Julie [name] MRN: [id number]  Date: 03/18/2016  DOB: 16-Mar-1933  HW:EXHBZ Maudie Mercury, MD  Truitt Merle, MD   REFERRING PHYSICIAN: Truitt Merle, MD  DIAGNOSIS: The encounter diagnosis was Malignant neoplasm of upper-outer quadrant of left breast in female, estrogen receptor negative ([place]).   Stage IA (pT1c, pN0) grade 3 invasive ductal carcinoma of the UOQ of the left breast (triple negative)  HISTORY OF PRESENT ILLNESS::Julie [name] is a 82 y.o. female who had a screening mammogram on 10/09/15 showing a possible left breast mass. Subsequent imaging and biopsy led to a left lumpectomy and sentinel lymph node biopsy on 11/14/15. This revealed grade 3 invasive ductal carcinoma spanning 1.1 cm with the resection margins negative for carcinoma (triple negative, Ki67 80%). Biopsy of a left axillary sentinel lymph node revealed benign breast parenchyma and no lymph nodal tissue was identified. Surgery was performed by Dr. [name].  Due to the aggressive nature of triple negative breast cancer and 20-30% risk of distant recurrence in the next 5-10 years, Dr. Burr Medico recommended Taxol for 12 weeks as adjuvant chemotherapy to reduce her risk of cancer recurrence. The patient was given weekly Taxol 80 mg/m, changed to Abraxane 100 mg/m from week 3 due to infusion reaction, and dose reduced from week 9 (down to 60 mg/m) due to neuropathy. The patient completed chemotherapy on 03/13/16.  The patient and her daughter present today to discuss the role of radiation in the management of her disease.  Oncology History   Malignant neoplasm of upper outer quadrant of female breast [facility])   Staging form: Breast, AJCC 7th Edition     Pathologic stage from 11/14/2015: Stage IA (T1c, N0, cM0) - Signed by Truitt Merle, MD on 11/25/2015     Clinical stage from 11/20/2015: Stage IA (T1c, N0, M0) - Signed by Curt Bears, MD on 11/20/2015       Malignant neoplasm of upper outer quadrant of female breast ([place])   10/09/2015 Mammogram    Screening bilateral mammograms showed a possible left breast mass. Diagnostic mammogram and ultrasound showed a 7 x 7 x 7 mm mass in the left breast at 11:00 position.      10/22/2015 Receptors her2    ER negative, PR negative, HER-2 not amplified      10/22/2015 Initial Biopsy    Left breast needle core biopsy at the 1:00 position showed invasive ductal carcinoma.      10/22/2015 Initial Diagnosis    Malignant neoplasm of upper outer quadrant of female breast ([place])      11/14/2015 Surgery    Left breast lumpectomy and sentinel lymph node biopsy      11/14/2015 Pathology Results    Left breast lumpectomy showed invasive ductal carcinoma, grade 3, 1.1 cm, resection margins were negative, sentinel lymph node biopsy showed benign breast parenchyma, lymph node tissue not identified. Repeated ER PR and HER-2 were all negative.      12/20/2015 -  Chemotherapy    Weekly Taxol 80 mg/m, changed to Abraxane 100 mg/m from week 3 due to infusion reaction, and dose reduced from week 9 due to neuropathy.       PREVIOUS RADIATION THERAPY: No  PAST MEDICAL HISTORY:  has a past medical history of Anemia; Anxiety; Arthritis; Blood transfusion; Cataracts, bilateral; Complication of anesthesia; Depression; Diabetes mellitus without complication ([place]); Dyspnea; Fibromyalgia; GERD (gastroesophageal reflux disease); Glaucoma; Hard of hearing;  Heart murmur; Histoplasmosis; History of colon polyps; History of gout; History of hiatal hernia; History of shingles; Hyperlipidemia; Hypertension; Incontinence of bowel; Interstitial cystitis; Joint pain; Nocturia; Peripheral neuropathy ([place]); PONV (postoperative nausea and vomiting); Ulcer ([place]) (1970); Urinary frequency; Urinary urgency; and Weakness.    PAST SURGICAL HISTORY: Past Surgical History:  Procedure Laterality Date  . ABDOMINAL  HYSTERECTOMY    . accupuncture  3 yrs ago  . ANKLE FUSION Left 05/10/2014   Procedure: LEFT ANKLE ARTHRODESIS ;  Surgeon: Wylene Simmer, MD;  Location: [place];  Service: Orthopedics;  Laterality: Left;  . ANKLE SURGERY Left   . arm surgery Left    radius  . bladder tacked     . CERVICAL FUSION    . CHOLECYSTECTOMY    . COLONOSCOPY    . CYSTOCELE REPAIR    . DILATION AND CURETTAGE OF UTERUS    . ESOPHAGOGASTRODUODENOSCOPY    . PORT A CATH REVISION N/A 12/27/2015   Procedure: PORT A CATH REVISION;  Surgeon: Rolm Bookbinder, MD;  Location: [place];  Service: General;  Laterality: N/A;  . PORTACATH PLACEMENT Right 12/27/2015   Procedure: INSERTION PORT-A-CATH WITH ULTRASOUND ;  Surgeon: Rolm Bookbinder, MD;  Location: [place];  Service: General;  Laterality: Right;  . RADIOACTIVE SEED GUIDED MASTECTOMY WITH AXILLARY SENTINEL LYMPH NODE BIOPSY Left 11/14/2015   Procedure: RADIOACTIVE SEED GUIDED LEFT BREAST LUMPECTOMY WITH AXILLARY SENTINEL LYMPH NODE BIOPSY;  Surgeon: Rolm Bookbinder, MD;  Location: [place];  Service: General;  Laterality: Left;  RADIOACTIVE SEED GUIDED LEFT BREAST LUMPECTOMY WITH AXILLARY SENTINEL LYMPH NODE BIOPSY  . SALPINGOOPHORECTOMY Bilateral   . TOTAL KNEE ARTHROPLASTY Right 02/28/2015   Procedure: RIGHT TOTAL KNEE ARTHROPLASTY;  Surgeon: Rod Can, MD;  Location: [facility];  Service: Orthopedics;  Laterality: Right;  . UPPER GASTROINTESTINAL ENDOSCOPY    . VAGINAL HYSTERECTOMY      FAMILY HISTORY: family history includes Brain cancer in her other; Breast cancer in her mother; Colon cancer (age of onset: 44) in her sister; Esophageal cancer (age of onset: 46) in her brother.  SOCIAL HISTORY:  reports that she has never smoked. She has never used smokeless tobacco. She reports that she does not drink alcohol or use drugs.  ALLERGIES: Other and Aspirin  MEDICATIONS:  Current Outpatient Prescriptions  Medication  Sig Dispense Refill  . acetaminophen (TYLENOL) 500 MG tablet Take 500 mg by mouth every 6 (six) hours as needed.    [name] amLODipine (NORVASC) 10 MG tablet Take 10 mg by mouth every morning.     . B-D TB SYRINGE 1CC/25GX5/8" 25G X 5/8" 1 ML MISC USE WITH METHOTREXATE  0  . calcium-vitamin D (OSCAL WITH D) 500-200 MG-UNIT per tablet Take 1 tablet by mouth every morning.    . ezetimibe (ZETIA) 10 MG tablet Take 10 mg by mouth every other day. Trial, taking every other day    . fish oil-omega-3 fatty acids 1000 MG capsule Take 1 g by mouth every morning.     . folic acid (FOLVITE) 1 MG tablet Take 2 mg by mouth every morning.     . lidocaine-prilocaine (EMLA) cream Apply to affected area once 30 g 3  . meloxicam (MOBIC) 15 MG tablet Take 15 mg by mouth daily.  11  . metFORMIN (GLUCOPHAGE) 500 MG tablet Take by mouth 2 (two) times daily with a meal.    . metoprolol succinate (TOPROL-XL) 50 MG 24 hr tablet TAKE 1 TABLET AT  BEDTIME ONCE A DAY ORALLY 90 DAYS  2  . ondansetron (ZOFRAN) 8 MG tablet Take 1 tablet (8 mg total) by mouth 2 (two) times daily as needed (Nausea or vomiting). 30 tablet 1  . pantoprazole (PROTONIX) 40 MG tablet TAKE 1 TABLET (40 MG TOTAL) BY MOUTH DAILY. 30 tablet 0  . pregabalin (LYRICA) 75 MG capsule Take 75 mg by mouth 2 (two) times daily.    . prochlorperazine (COMPAZINE) 10 MG tablet Take 1 tablet (10 mg total) by mouth every 6 (six) hours as needed (Nausea or vomiting). 30 tablet 1  . valsartan-hydrochlorothiazide (DIOVAN-HCT) 320-12.5 MG per tablet Take 1 tablet by mouth daily.    . vitamin B-12 (CYANOCOBALAMIN) 1000 MCG tablet Take 1,000 mcg by mouth daily.    . vitamin E 400 UNIT capsule Take 400 Units by mouth daily.    . clonazePAM (KLONOPIN) 0.5 MG tablet Take 0.5 mg by mouth at bedtime.    . methotrexate (50 MG/ML) 1 g injection Inject into the skin once a week.    . traMADol (ULTRAM) 50 MG tablet Take by mouth every 6 (six) hours as needed. Reported on 11/25/2015     No  current facility-administered medications for this encounter.     REVIEW OF SYSTEMS:  A 15 point review of systems is documented in the electronic medical record. This was obtained by the nursing staff. However, I reviewed this with the patient to discuss relevant findings and make appropriate changes.  Pertinent items noted in HPI and remainder of comprehensive ROS otherwise negative.   The patient reports occasional sharp pains in her left breast and peripheral neuropathy. The patient was referred to physical therapy for neuropathy and gait stability.   PHYSICAL EXAM:  height is '5\' 3"'$  (1.6 m) and weight is 219 lb 12.8 oz (99.7 kg). Her oral temperature is 98 F (36.7 C). Her blood pressure is 134/71 and her pulse is 68. Her oxygen saturation is 100%.   General: Alert and oriented, in no acute distress HEENT: Head is normocephalic. Extraocular movements are intact. Oropharynx is clear. Neck: Neck is supple, no palpable cervical or supraclavicular lymphadenopathy. Heart: Regular in rate and rhythm with no murmurs, rubs, or gallops. Chest: Clear to auscultation bilaterally, with no rhonchi, wheezes, or rales. Abdomen: Soft, nontender, nondistended, with no rigidity or guarding. Extremities: No cyanosis or edema. Lymphatics: see Neck Exam. No axillary adenopathy. Skin: No concerning lesions. Musculoskeletal: symmetric strength and muscle tone throughout. Neurologic: Cranial nerves II through XII are grossly intact. No obvious focalities. Speech is fluent. Coordination is intact. Psychiatric: Judgment and insight are intact. Affect is appropriate. Breast: Right breast no palpable mass or nipple discharge. Left breast periareolar scar in the UOQ, separate scar in the axillary region from a sentinel procedure, no dominant mass appreciated in the breast, no nipple discharge or bleeding.  ECOG = 1  LABORATORY DATA:  Lab Results  Component Value Date   WBC 3.1 (L) 03/13/2016   HGB 10.0 (L)  03/13/2016   HCT 30.9 (L) 03/13/2016   MCV 85.1 03/13/2016   PLT 269 03/13/2016   NEUTROABS 1.7 03/13/2016   Lab Results  Component Value Date   NA 142 03/13/2016   K 3.9 03/13/2016   CL 108 03/01/2015   CO2 22 03/13/2016   GLUCOSE 78 03/13/2016   CREATININE 1.1 03/13/2016   CALCIUM 9.4 03/13/2016      RADIOGRAPHY: No results found.    IMPRESSION: Stage IA (pT1c, pN0) grade 3 invasive ductal  carcinoma of the UOQ of the left breast (triple negative)  The patient is a candidate for radiation therapy as part of her breast conservation treatment.  I spoke to the patient today regarding her diagnosis and options for treatment. We discussed the equivalence in terms of survival and local failure between mastectomy and breast conservation. We discussed the role of radiation in decreasing local failures in patients who undergo lumpectomy. We discussed the process of simulation and the placement tattoos. We discussed 4-6 weeks of treatment as an outpatient. We discussed the possibility of asymptomatic lung damage. We discussed the low likelihood of secondary malignancies. We discussed the possible side effects including but not limited to skin redness, fatigue, permanent skin darkening, and breast swelling.  We discussed the use of cardiac sparing with deep inspiration breath hold if needed.  PLAN: The patient signed a consent form and a copy was placed in her medical chart. CT simulation is scheduled on 04/01/16 at [facility]. The patient completed chemotherapy on 03/13/16 and has her port-a-cath scheduled for removal on 03/25/16.     ------------------------------------------------  Blair Promise, PhD, MD  This document serves as a record of services personally performed by Gery Pray, MD. It was created on his behalf by Darcus Austin, a trained medical scribe. The creation of this record is based on the scribe's personal observations and the provider's statements to them. This document has been  checked and approved by the attending provider.

## 2016-03-18 NOTE — Telephone Encounter (Signed)
Santiago Glad from Radiation Oncology called to say that patient is expecting Dr. Burr Medico to call her in something for her neuropathy to her pharmacy.  Will let Dr. Burr Medico know.

## 2016-03-18 NOTE — Progress Notes (Signed)
Please see the Nurse Progress Note in the MD Initial Consult Encounter for this patient. 

## 2016-03-19 NOTE — Addendum Note (Signed)
Encounter addended by: Jacqulyn Liner, RN on: 03/19/2016  9:03 AM<BR>    Actions taken: Charge Capture section accepted

## 2016-03-23 ENCOUNTER — Ambulatory Visit: Payer: Medicare Other | Admitting: Physical Therapy

## 2016-03-23 ENCOUNTER — Encounter: Payer: Self-pay | Admitting: Physical Therapy

## 2016-03-23 ENCOUNTER — Other Ambulatory Visit: Payer: Self-pay | Admitting: Hematology

## 2016-03-23 DIAGNOSIS — R2681 Unsteadiness on feet: Secondary | ICD-10-CM | POA: Diagnosis not present

## 2016-03-23 DIAGNOSIS — R262 Difficulty in walking, not elsewhere classified: Secondary | ICD-10-CM

## 2016-03-23 DIAGNOSIS — R29898 Other symptoms and signs involving the musculoskeletal system: Secondary | ICD-10-CM

## 2016-03-23 DIAGNOSIS — M6281 Muscle weakness (generalized): Secondary | ICD-10-CM | POA: Diagnosis not present

## 2016-03-23 MED ORDER — GABAPENTIN 100 MG PO CAPS: 100.0000 mg | ORAL_CAPSULE | Freq: Three times a day (TID) | ORAL | 1 refills | Status: DC

## 2016-03-23 NOTE — Therapy (Addendum)
[place], [name], [address] Phone: [phone]   Fax:  [phone]  Physical Therapy Treatment  Patient Details  Name: Julie [name] MRN: [id number] Date of Birth: 05-Jan-1933 Referring Provider: Truitt Merle, MD  Encounter Date: 03/23/2016      PT End of Session - 03/23/16 1202    Visit Number 4   Number of Visits 13   Date for PT Re-Evaluation 04/24/16   PT Start Time 1016   PT Stop Time 1058   PT Time Calculation (min) 42 min   Activity Tolerance Patient tolerated treatment well   Behavior During Therapy WFL for tasks assessed/performed      Past Medical History:  Diagnosis Date  . Anemia   . Anxiety    takes Klonopin at bedtime  . Arthritis    takes Methotrexate weekly and Prednisone  . Blood transfusion    no abnormal reaction noted  . Cataracts, bilateral    immature  . Complication of anesthesia    wakes up during surgery  . Depression   . Diabetes mellitus without complication ([place])   . Dyspnea    with exertion since chemo   . Fibromyalgia    takes Lyrica daily  . GERD (gastroesophageal reflux disease)    takes Nexium daily  . Glaucoma   . Hard of hearing   . Heart murmur   . Histoplasmosis   . History of colon polyps   . History of gout   . History of hiatal hernia   . History of shingles   . Hyperlipidemia   . Hypertension    takes Amlodipine,Metoprolol,and Losartan daily  . Incontinence of bowel   . Interstitial cystitis   . Joint pain   . Nocturia   . Peripheral neuropathy ([place])   . PONV (postoperative nausea and vomiting)   . Ulcer ([place]) 1970   duodenal  . Urinary frequency   . Urinary urgency   . Weakness    numbness in both hands and feet    Past Surgical History:  Procedure Laterality Date  . ABDOMINAL HYSTERECTOMY    . accupuncture  3 yrs ago  . ANKLE FUSION Left 05/10/2014   Procedure: LEFT ANKLE ARTHRODESIS ;  Surgeon: Wylene Simmer, MD;   Location: [place];  Service: Orthopedics;  Laterality: Left;  . ANKLE SURGERY Left   . arm surgery Left    radius  . bladder tacked     . CERVICAL FUSION    . CHOLECYSTECTOMY    . COLONOSCOPY    . CYSTOCELE REPAIR    . DILATION AND CURETTAGE OF UTERUS    . ESOPHAGOGASTRODUODENOSCOPY    . PORT A CATH REVISION N/A 12/27/2015   Procedure: PORT A CATH REVISION;  Surgeon: Rolm Bookbinder, MD;  Location: [place];  Service: General;  Laterality: N/A;  . PORTACATH PLACEMENT Right 12/27/2015   Procedure: INSERTION PORT-A-CATH WITH ULTRASOUND ;  Surgeon: Rolm Bookbinder, MD;  Location: [place];  Service: General;  Laterality: Right;  . RADIOACTIVE SEED GUIDED MASTECTOMY WITH AXILLARY SENTINEL LYMPH NODE BIOPSY Left 11/14/2015   Procedure: RADIOACTIVE SEED GUIDED LEFT BREAST LUMPECTOMY WITH AXILLARY SENTINEL LYMPH NODE BIOPSY;  Surgeon: Rolm Bookbinder, MD;  Location: [place];  Service: General;  Laterality: Left;  RADIOACTIVE SEED GUIDED LEFT BREAST LUMPECTOMY WITH AXILLARY SENTINEL LYMPH NODE BIOPSY  . SALPINGOOPHORECTOMY Bilateral   . TOTAL KNEE ARTHROPLASTY Right 02/28/2015   Procedure: RIGHT TOTAL KNEE ARTHROPLASTY;  Surgeon: Rod Can, MD;  [facility];  Service: Orthopedics;  Laterality: Right;  . UPPER GASTROINTESTINAL ENDOSCOPY    . VAGINAL HYSTERECTOMY      There were no vitals filed for this visit.      Subjective Assessment - 03/23/16 1018    Subjective Patient reports she has been doing a lot of walking at home and has been using the stationary bike but has not been doing the other exercises. I have been doing 15 min in the morning and 15 min in the evening.    Pertinent History Patient diagnosed with triple negative invasive ductal carcinoma of left breast s/p left lumpectomy and sentinel lymph node biopsy on 11/14/15. She began chemotherapy on 12/20/15 and finishes that on 03/13/16; then will begin radiation after.     Patient Stated Goals to start walking better, be able to walk without aid   Currently in Pain? Yes   Pain Score 2    Pain Location Hip   Pain Orientation Left   Pain Descriptors / Indicators Penetrating   Pain Type Chronic pain   Pain Onset More than a month ago   Pain Frequency Constant   Pain Descriptors / Indicators Crying                         [facility] Adult PT Treatment/Exercise - 03/23/16 0001      Balance   Balance comment In // bars: grapevine walking to Rt and Lt 2 times each; static standing with eyes closed 30 seconds without UE support, then with eyes closed in tandem with occasional brief UE support but with SBA     High Level Balance   High Level Balance Activities Tandem walking   High Level Balance Comments 2 sets in // bars with moderate UE support, moderately challenging for pt, front and backwards.     Knee/Hip Exercises: Aerobic   Nustep Level 4 for 10 minutes     Knee/Hip Exercises: Standing   Hip Extension Stengthening;Both;1 set;10 reps  2 lb ankle weights, increased left hip discomfort     Knee/Hip Exercises: Seated   Long Arc Quad Strengthening;Both;20 reps  2 lbs   Marching Strengthening;Both;10 reps  2 lbs     Knee/Hip Exercises: Supine   Short Arc Quad Sets Strengthening;Both;2 sets;10 reps  2 lbs   Bridges Strengthening;Both;10 reps   Straight Leg Raises Strengthening;Both;10 reps  2 lbs                   Short Term Clinic Goals - 03/18/16 1054      CC Short Term Goal  #1   Title Patient will achieve bilateral hip extensor strength of at least 4/5 for improved safety with ambulation.   Baseline Bil hip 3+/5 03/18/16   Status On-going     CC Short Term Goal  #2   Title Patient will achieve right hip abduction strength of at least 4/5 for improved safety with ambulation.   Baseline In 4-/5 03/18/16   Status On-going     CC Short Term Goal  #3   Title Patient will be able to stand with feet together with eyes  open for at least 30 seconds independently for improved safety with ADLs.   Status Achieved             Long Term Clinic Goals - 03/11/16 1505      CC Long Term Goal  #1   Title Patient will  improve gait speed by 0.1 m/s for improved safety with ambulation.   Time 6   Period Weeks   Status New     CC Long Term Goal  #2   Title Patient will be independent with HEP for LE strengthening.   Time 6   Period Weeks   Status New     CC Long Term Goal  #3   Title Patient will be able to ambulate 250 ft on level surface without assistive device independently for improved safety with community ambulation.   Time 6   Period Weeks   Status New            Plan - 03/23/16 1202    Clinical Impression Statement Patient demonstrates improvement with her balance this visit. She has not completed exercises at home but has been using her stationary bike 15 min 2x/day and walking. She is having left posterior hip pain since she started chemotherapy and I think she would benefit from piriformis stretches to help decrease discomfort. Added standing hip extension exercises today.    Rehab Potential Good   Clinical Impairments Affecting Rehab Potential current chemotherapy and will soon begin radiation   PT Frequency 2x / week   PT Duration 6 weeks   PT Treatment/Interventions ADLs/Self Care Home Management;Electrical Stimulation;Moist Heat;Neuromuscular re-education;Balance training;Therapeutic exercise;Therapeutic activities;Gait training;DME Instruction;Patient/family education;Manual techniques;Manual lymph drainage;Passive range of motion;Taping   PT Next Visit Plan increase weight to 3 lb, add more standing LE strengthening exercises, higher level balance and piriformis stretching   Consulted and Agree with Plan of Care Patient      Patient will benefit from skilled therapeutic intervention in order to improve the following deficits and impairments:  Decreased balance, Decreased strength,  Difficulty walking, Postural dysfunction, Pain, Abnormal gait  Visit Diagnosis: Difficulty in walking, not elsewhere classified  Unsteadiness on feet  Other symptoms and signs involving the musculoskeletal system  Muscle weakness (generalized)     Problem List Patient Active Problem List   Diagnosis Date Noted  . Anemia due to antineoplastic chemotherapy 03/06/2016  . Hypersensitivity reaction 01/07/2016  . Port catheter in place 01/03/2016  . Malignant neoplasm of upper outer quadrant of female breast ([place]) 10/30/2015  . Primary osteoarthritis of right knee 02/28/2015  . Post-traumatic arthritis of ankle 05/10/2014  . MGUS (monoclonal gammopathy of unknown significance) 02/17/2012  . Diverticulosis of colon (without mention of hemorrhage) 01/21/2011  . Family history of malignant neoplasm of gastrointestinal tract 01/21/2011  . Special screening for malignant neoplasms, colon 01/21/2011  . Other symptoms involving digestive system(787.99) 01/21/2011    Alexia Freestone 03/23/2016, 12:06 PM  [place], [name], [address] Phone: [phone]   Fax:  [phone]  Name: Julie [name] MRN: [id number] Date of Birth: November 07, 1932  Allyson Sabal, PT 03/23/16 12:06 PM  PHYSICAL THERAPY DISCHARGE SUMMARY  Visits from Start of Care: 4  Current functional level related to goals / functional outcomes: See above - Pt called and wanted to cancel all of her appointments due to an increase in back pain and knee arthritis. Pt states her oncologist suggested she "give up therapy for a while." Pt wishes to be discharged at this time.    Remaining deficits: See above   Education / Equipment: HEP Plan: Patient agrees to discharge.  Patient goals were not met. Patient is being discharged due to the patient's request.  ?????

## 2016-03-24 ENCOUNTER — Ambulatory Visit
Admission: RE | Admit: 2016-03-24 | Discharge: 2016-03-24 | Disposition: A | Discharge status: Home / Self Care | Payer: Medicare Other | Source: Ambulatory Visit | Attending: Radiation Oncology | Admitting: Radiation Oncology

## 2016-03-24 DIAGNOSIS — Z171 Estrogen receptor negative status [ER-]: Secondary | ICD-10-CM | POA: Diagnosis not present

## 2016-03-24 DIAGNOSIS — C50412 Malignant neoplasm of upper-outer quadrant of left female breast: Secondary | ICD-10-CM

## 2016-03-24 DIAGNOSIS — Z510 Encounter for antineoplastic radiation therapy: Secondary | ICD-10-CM | POA: Diagnosis not present

## 2016-03-24 NOTE — Progress Notes (Signed)
  Radiation Oncology         [phone] ________________________________  Name: Julie [name] MRN: [id number]  Date: 03/24/2016  DOB: March 27, 1933  SIMULATION AND TREATMENT PLANNING NOTE    ICD-9-CM ICD-10-CM   1. Malignant neoplasm of upper-outer quadrant of left breast in female, estrogen receptor negative (HCC) 174.4 C50.412    V86.1 Z17.1     DIAGNOSIS:  Stage IA (pT1c, pN0) grade 3 invasive ductal carcinoma of the UOQ of the left breast (triple negative)  NARRATIVE:  The patient was brought to the [facility].  Identity was confirmed.  All relevant records and images related to the planned course of therapy were reviewed.  The patient freely provided informed written consent to proceed with treatment after reviewing the details related to the planned course of therapy. The consent form was witnessed and verified by the simulation staff.  Then, the patient was set-up in a stable reproducible  supine position for radiation therapy.  CT images were obtained.  Surface markings were placed.  The CT images were loaded into the planning software.  Then the target and avoidance structures were contoured.  Treatment planning then occurred.  The radiation prescription was entered and confirmed.  Then, I designed and supervised the construction of a total of 16 medically necessary complex treatment devices.  I have requested : 3D Simulation  I have requested a DVH of the following structures: heart lungs, lumpectomy cavity.  I have ordered:dose calc.  PLAN:  The patient will receive 50.4 Gy in 28 fractions.  Hypofx tx will be used if technically possible.    Special treatment procedure was performed today due to the extra time and effort required by myself to plan and prepare this patient for deep inspiration breath hold technique.  I have determined cardiac sparing to be of benefit to this patient to prevent long term cardiac damage due to radiation of the heart.  Bellows were  placed on the patient's abdomen. To facilitate cardiac sparing, the patient was coached by the radiation therapists on breath hold techniques and breathing practice was performed. Practice waveforms were obtained. The patient was then scanned while maintaining breath hold in the treatment position.  This image was then transferred over to the imaging specialist. The imaging specialist then created a fusion of the free breathing and breath hold scans using the chest wall as the stable structure. I personally reviewed the fusion in axial, coronal and sagittal image planes.  Excellent cardiac sparing was obtained.  I felt the patient is an appropriate candidate for breath hold and the patient will be treated as such.  The image fusion was then reviewed with the patient to reinforce the necessity of reproducible breath hold.  -----------------------------------,  Blair Promise, PhD, MD  This document serves as a record of services personally performed by Gery Pray, MD. It was created on his behalf by Bethann Humble, a trained medical scribe. The creation of this record is based on the scribe's personal observations and the provider's statements to them. This document has been checked and approved by the attending provider.

## 2016-03-24 NOTE — Progress Notes (Signed)
Spoke with patient and informed her to take Gabapentin, Amlodipine and pantoprazole in the morning  On 03/25/2016 with a sip of water to get them down. Patient verbalized understanding.

## 2016-03-24 NOTE — Anesthesia Preprocedure Evaluation (Addendum)
Anesthesia Evaluation  Patient identified by MRN, date of birth, ID band Patient awake    Reviewed: Allergy & Precautions, NPO status , Patient's Chart, lab work & pertinent test results  History of Anesthesia Complications (+) PONV and history of anesthetic complications  Airway Mallampati: II   Neck ROM: full    Dental no notable dental hx. (+) Dental Advisory Given   Pulmonary    breath sounds clear to auscultation       Cardiovascular hypertension,  Rhythm:regular Rate:Normal     Neuro/Psych Anxiety Depression  Neuromuscular disease    GI/Hepatic hiatal hernia, GERD  ,  Endo/Other  diabetes, Type 2obese  Renal/GU      Musculoskeletal  (+) Arthritis , Fibromyalgia -  Abdominal   Peds  Hematology   Anesthesia Other Findings   Reproductive/Obstetrics                            Anesthesia Physical  Anesthesia Plan  ASA: III  Anesthesia Plan: MAC   Post-op Pain Management:    Induction:   Airway Management Planned: Natural Airway and Simple Face Mask  Additional Equipment:   Intra-op Plan:   Post-operative Plan:   Informed Consent: I have reviewed the patients History and Physical, chart, labs and discussed the procedure including the risks, benefits and alternatives for the proposed anesthesia with the patient or authorized representative who has indicated his/her understanding and acceptance.   Dental advisory given  Plan Discussed with: CRNA and Anesthesiologist  Anesthesia Plan Comments:        Anesthesia Quick Evaluation

## 2016-03-25 ENCOUNTER — Encounter (HOSPITAL_COMMUNITY)
Admission: RE | Disposition: A | Discharge status: Home / Self Care | Payer: Self-pay | Source: Ambulatory Visit | Attending: General Surgery

## 2016-03-25 ENCOUNTER — Ambulatory Visit (HOSPITAL_COMMUNITY): Payer: Medicare Other | Admitting: Anesthesiology

## 2016-03-25 ENCOUNTER — Ambulatory Visit (HOSPITAL_COMMUNITY)
Admission: RE | Admit: 2016-03-25 | Discharge: 2016-03-25 | Disposition: A | Discharge status: Home / Self Care | Payer: Medicare Other | Source: Ambulatory Visit | Attending: General Surgery | Admitting: General Surgery

## 2016-03-25 ENCOUNTER — Encounter (HOSPITAL_COMMUNITY): Payer: Self-pay | Admitting: *Deleted

## 2016-03-25 ENCOUNTER — Encounter: Payer: Self-pay | Admitting: Physical Therapy

## 2016-03-25 DIAGNOSIS — E669 Obesity, unspecified: Secondary | ICD-10-CM | POA: Diagnosis not present

## 2016-03-25 DIAGNOSIS — M797 Fibromyalgia: Secondary | ICD-10-CM | POA: Diagnosis not present

## 2016-03-25 DIAGNOSIS — I10 Essential (primary) hypertension: Secondary | ICD-10-CM | POA: Insufficient documentation

## 2016-03-25 DIAGNOSIS — Z96651 Presence of right artificial knee joint: Secondary | ICD-10-CM | POA: Diagnosis not present

## 2016-03-25 DIAGNOSIS — Z452 Encounter for adjustment and management of vascular access device: Secondary | ICD-10-CM | POA: Insufficient documentation

## 2016-03-25 DIAGNOSIS — Z7984 Long term (current) use of oral hypoglycemic drugs: Secondary | ICD-10-CM | POA: Insufficient documentation

## 2016-03-25 DIAGNOSIS — Z791 Long term (current) use of non-steroidal anti-inflammatories (NSAID): Secondary | ICD-10-CM | POA: Insufficient documentation

## 2016-03-25 DIAGNOSIS — Z79899 Other long term (current) drug therapy: Secondary | ICD-10-CM | POA: Insufficient documentation

## 2016-03-25 DIAGNOSIS — E1142 Type 2 diabetes mellitus with diabetic polyneuropathy: Secondary | ICD-10-CM | POA: Diagnosis not present

## 2016-03-25 DIAGNOSIS — K5730 Diverticulosis of large intestine without perforation or abscess without bleeding: Secondary | ICD-10-CM | POA: Diagnosis not present

## 2016-03-25 DIAGNOSIS — M1711 Unilateral primary osteoarthritis, right knee: Secondary | ICD-10-CM | POA: Diagnosis not present

## 2016-03-25 DIAGNOSIS — K219 Gastro-esophageal reflux disease without esophagitis: Secondary | ICD-10-CM | POA: Insufficient documentation

## 2016-03-25 DIAGNOSIS — C50411 Malignant neoplasm of upper-outer quadrant of right female breast: Secondary | ICD-10-CM | POA: Diagnosis not present

## 2016-03-25 DIAGNOSIS — Z853 Personal history of malignant neoplasm of breast: Secondary | ICD-10-CM | POA: Insufficient documentation

## 2016-03-25 DIAGNOSIS — M1990 Unspecified osteoarthritis, unspecified site: Secondary | ICD-10-CM | POA: Diagnosis not present

## 2016-03-25 DIAGNOSIS — Z6838 Body mass index (BMI) 38.0-38.9, adult: Secondary | ICD-10-CM | POA: Diagnosis not present

## 2016-03-25 DIAGNOSIS — F419 Anxiety disorder, unspecified: Secondary | ICD-10-CM | POA: Insufficient documentation

## 2016-03-25 DIAGNOSIS — E785 Hyperlipidemia, unspecified: Secondary | ICD-10-CM | POA: Diagnosis not present

## 2016-03-25 HISTORY — DX: Dyspnea, unspecified: R06.00

## 2016-03-25 HISTORY — PX: PORT-A-CATH REMOVAL: SHX5289

## 2016-03-25 LAB — GLUCOSE, CAPILLARY
GLUCOSE-CAPILLARY: 85 mg/dL (ref 65–99)
Glucose-Capillary: 88 mg/dL (ref 65–99)

## 2016-03-25 SURGERY — REMOVAL PORT-A-CATH
Anesthesia: Monitor Anesthesia Care

## 2016-03-25 MED ORDER — PROPOFOL 10 MG/ML IV BOLUS
INTRAVENOUS | Status: DC | PRN
  Administered 2016-03-25 (×8): 20 mg via INTRAVENOUS

## 2016-03-25 MED ORDER — LIDOCAINE 2% (20 MG/ML) 5 ML SYRINGE
INTRAMUSCULAR | Status: DC | PRN
  Administered 2016-03-25: 50 mg via INTRAVENOUS

## 2016-03-25 MED ORDER — DEXAMETHASONE SODIUM PHOSPHATE 10 MG/ML IJ SOLN
INTRAMUSCULAR | Status: AC
  Filled 2016-03-25: qty 1

## 2016-03-25 MED ORDER — ONDANSETRON HCL 4 MG/2ML IJ SOLN
INTRAMUSCULAR | Status: AC
  Filled 2016-03-25: qty 2

## 2016-03-25 MED ORDER — PROPOFOL 10 MG/ML IV BOLUS
INTRAVENOUS | Status: AC
Start: 2016-03-25 — End: 2016-03-25
  Filled 2016-03-25: qty 20

## 2016-03-25 MED ORDER — ONDANSETRON HCL 4 MG/2ML IJ SOLN
INTRAMUSCULAR | Status: DC | PRN
  Administered 2016-03-25: 4 mg via INTRAVENOUS

## 2016-03-25 MED ORDER — 0.9 % SODIUM CHLORIDE (POUR BTL) OPTIME
TOPICAL | Status: DC | PRN
  Administered 2016-03-25: 1000 mL

## 2016-03-25 MED ORDER — PROMETHAZINE HCL 25 MG/ML IJ SOLN: 6.2500 mg | INTRAMUSCULAR | Status: DC | PRN

## 2016-03-25 MED ORDER — BUPIVACAINE HCL (PF) 0.5 % IJ SOLN
INTRAMUSCULAR | Status: AC
  Filled 2016-03-25: qty 30

## 2016-03-25 MED ORDER — FENTANYL CITRATE (PF) 100 MCG/2ML IJ SOLN
INTRAMUSCULAR | Status: AC
  Filled 2016-03-25: qty 2

## 2016-03-25 MED ORDER — LACTATED RINGERS IV SOLN
INTRAVENOUS | Status: DC
  Administered 2016-03-25: 08:00:00 via INTRAVENOUS

## 2016-03-25 MED ORDER — LIDOCAINE 2% (20 MG/ML) 5 ML SYRINGE
INTRAMUSCULAR | Status: AC
  Filled 2016-03-25: qty 5

## 2016-03-25 MED ORDER — CEFAZOLIN SODIUM-DEXTROSE 2-4 GM/100ML-% IV SOLN
INTRAVENOUS | Status: AC
  Filled 2016-03-25: qty 100

## 2016-03-25 MED ORDER — FENTANYL CITRATE (PF) 100 MCG/2ML IJ SOLN
INTRAMUSCULAR | Status: DC | PRN
  Administered 2016-03-25 (×2): 50 ug via INTRAVENOUS

## 2016-03-25 MED ORDER — CEFAZOLIN SODIUM-DEXTROSE 2-4 GM/100ML-% IV SOLN
2.0000 g | INTRAVENOUS | Status: AC
  Administered 2016-03-25: 2 g via INTRAVENOUS
  Filled 2016-03-25: qty 100

## 2016-03-25 MED ORDER — BUPIVACAINE HCL (PF) 0.5 % IJ SOLN
INTRAMUSCULAR | Status: DC | PRN
  Administered 2016-03-25: 9 mL

## 2016-03-25 MED ORDER — HYDROMORPHONE HCL 1 MG/ML IJ SOLN: 0.2500 mg | INTRAMUSCULAR | Status: DC | PRN

## 2016-03-25 MED ORDER — DEXAMETHASONE SODIUM PHOSPHATE 10 MG/ML IJ SOLN
INTRAMUSCULAR | Status: DC | PRN
  Administered 2016-03-25: 10 mg via INTRAVENOUS

## 2016-03-25 SURGICAL SUPPLY — 29 items
BLADE HEX COATED 2.75 (ELECTRODE) ×3 IMPLANT
BLADE SURG 15 STRL LF DISP TIS (BLADE) ×1 IMPLANT
BLADE SURG 15 STRL SS (BLADE) ×2
COVER SURGICAL LIGHT HANDLE (MISCELLANEOUS) ×3 IMPLANT
DECANTER SPIKE VIAL GLASS SM (MISCELLANEOUS) IMPLANT
DERMABOND ADVANCED (GAUZE/BANDAGES/DRESSINGS) ×2
DERMABOND ADVANCED .7 DNX12 (GAUZE/BANDAGES/DRESSINGS) ×1 IMPLANT
DRAPE LAPAROTOMY TRNSV 102X78 (DRAPE) ×3 IMPLANT
ELECT PENCIL ROCKER SW 15FT (MISCELLANEOUS) ×3 IMPLANT
ELECT REM PT RETURN 9FT ADLT (ELECTROSURGICAL) ×3
ELECTRODE REM PT RTRN 9FT ADLT (ELECTROSURGICAL) ×1 IMPLANT
GAUZE SPONGE 4X4 12PLY STRL (GAUZE/BANDAGES/DRESSINGS) IMPLANT
GLOVE BIO SURGEON STRL SZ7 (GLOVE) ×3 IMPLANT
GLOVE BIOGEL PI IND STRL 7.5 (GLOVE) ×1 IMPLANT
GLOVE BIOGEL PI INDICATOR 7.5 (GLOVE) ×2
GOWN STRL NON-REIN LRG LVL3 (GOWN DISPOSABLE) ×3 IMPLANT
GOWN STRL REUS W/TWL XL LVL3 (GOWN DISPOSABLE) ×3 IMPLANT
KIT BASIN OR (CUSTOM PROCEDURE TRAY) ×3 IMPLANT
NEEDLE HYPO 25X1 1.5 SAFETY (NEEDLE) ×3 IMPLANT
PACK BASIC VI WITH GOWN DISP (CUSTOM PROCEDURE TRAY) ×3 IMPLANT
SPONGE LAP 4X18 X RAY DECT (DISPOSABLE) ×3 IMPLANT
SUT MNCRL AB 4-0 PS2 18 (SUTURE) ×3 IMPLANT
SUT VIC AB 3-0 SH 27 (SUTURE) ×2
SUT VIC AB 3-0 SH 27XBRD (SUTURE) ×1 IMPLANT
SYR 20CC LL (SYRINGE) ×3 IMPLANT
SYR BULB IRRIGATION 50ML (SYRINGE) IMPLANT
TOWEL OR 17X26 10 PK STRL BLUE (TOWEL DISPOSABLE) ×3 IMPLANT
TOWEL OR NON WOVEN STRL DISP B (DISPOSABLE) ×3 IMPLANT
YANKAUER SUCT BULB TIP 10FT TU (MISCELLANEOUS) IMPLANT

## 2016-03-25 NOTE — Op Note (Signed)
Preoperative diagnosis: breast cancer no longer needs venous access Postoperative diagnosis: same as above Procedure: right ij port removal Surgeon: Dr Serita Grammes EBL: minimal Anes: local mac Specimens none Complications none Drains none Sponge count correct Dispo to pacu stable  Indications: This is an 77 yof who has completed chemotherapy and desires port removal.    Procedure: After informed consent was obtained the patient was taken to the operating room. She was placed under MAC.  Then she was prepped and draped in the standard sterile surgical fashion. Surgical timeout was then performed.  I reentered her old incision. I then removed the port and line in their entirety. Hemostasis was obtained.  I then closed this with 3-0 Vicryl and 4-0 Monocryl. Dermabond was placed on both the incisions.A dressing was placed. She tolerated this well and was transferred to the recovery room in stable condition.

## 2016-03-25 NOTE — H&P (Signed)
Julie [name] is an 82 y.o. female.   Chief Complaint: breast cancer HPI:  50 yof who presents in follow up after left breast lumpectomy with attempt at sn biopsy. her pathology shows idc grade III spanning 1.1 cm with negative margins. Her tracer nor her dye travelled to her axilla. I thought I saw a node so removed that but that is breast tissue. she has no sn. she is doing well without complaint today. She has completed therapy and now is ready for port removal.   Past Medical History:  Diagnosis Date  . Anemia   . Anxiety    takes Klonopin at bedtime  . Arthritis    takes Methotrexate weekly and Prednisone  . Blood transfusion    no abnormal reaction noted  . Cataracts, bilateral    immature  . Complication of anesthesia    wakes up during surgery  . Depression   . Diabetes mellitus without complication ([place])   . Dyspnea    with exertion since chemo   . Fibromyalgia    takes Lyrica daily  . GERD (gastroesophageal reflux disease)    takes Nexium daily  . Glaucoma   . Hard of hearing   . Heart murmur   . Histoplasmosis   . History of colon polyps   . History of gout   . History of hiatal hernia   . History of shingles   . Hyperlipidemia   . Hypertension    takes Amlodipine,Metoprolol,and Losartan daily  . Incontinence of bowel   . Interstitial cystitis   . Joint pain   . Nocturia   . Peripheral neuropathy ([place])   . PONV (postoperative nausea and vomiting)   . Ulcer ([place]) 1970   duodenal  . Urinary frequency   . Urinary urgency   . Weakness    numbness in both hands and feet    Past Surgical History:  Procedure Laterality Date  . ABDOMINAL HYSTERECTOMY    . accupuncture  3 yrs ago  . ANKLE FUSION Left 05/10/2014   Procedure: LEFT ANKLE ARTHRODESIS ;  Surgeon: Wylene Simmer, MD;  Location: [facility];  Service: Orthopedics;  Laterality: Left;  . ANKLE SURGERY Left   . arm surgery Left    radius  . bladder tacked     . CERVICAL FUSION    . CHOLECYSTECTOMY     . COLONOSCOPY    . CYSTOCELE REPAIR    . DILATION AND CURETTAGE OF UTERUS    . ESOPHAGOGASTRODUODENOSCOPY    . PORT A CATH REVISION N/A 12/27/2015   Procedure: PORT A CATH REVISION;  Surgeon: Rolm Bookbinder, MD;  Location: [facility];  Service: General;  Laterality: N/A;  . PORTACATH PLACEMENT Right 12/27/2015   Procedure: INSERTION PORT-A-CATH WITH ULTRASOUND ;  Surgeon: Rolm Bookbinder, MD;  Location: [facility];  Service: General;  Laterality: Right;  . RADIOACTIVE SEED GUIDED MASTECTOMY WITH AXILLARY SENTINEL LYMPH NODE BIOPSY Left 11/14/2015   Procedure: RADIOACTIVE SEED GUIDED LEFT BREAST LUMPECTOMY WITH AXILLARY SENTINEL LYMPH NODE BIOPSY;  Surgeon: Rolm Bookbinder, MD;  Location: [facility];  Service: General;  Laterality: Left;  RADIOACTIVE SEED GUIDED LEFT BREAST LUMPECTOMY WITH AXILLARY SENTINEL LYMPH NODE BIOPSY  . SALPINGOOPHORECTOMY Bilateral   . TOTAL KNEE ARTHROPLASTY Right 02/28/2015   Procedure: RIGHT TOTAL KNEE ARTHROPLASTY;  Surgeon: Rod Can, MD;  Location: [facility];  Service: Orthopedics;  Laterality: Right;  . UPPER GASTROINTESTINAL ENDOSCOPY    . VAGINAL HYSTERECTOMY  Family History  Problem Relation Age of Onset  . Colon cancer Sister 44  . Esophageal cancer Brother 52  . Breast cancer Mother   . Brain cancer Other    Social History:  reports that she has never smoked. She has never used smokeless tobacco. She reports that she does not drink alcohol or use drugs.  Allergies:  Allergies  Allergen Reactions  . Other     Walnuts-anaphylaxis (patient denies allergy states that she just does not eat WALNUTS)  . Aspirin Other (See Comments)    Duodenal ulcer- cannot take aspirin when active... Does well with 81 mg. HYPERSENSITIVE  TO  REGULAR DOSE.      Medications Prior to Admission  Medication Sig Dispense Refill  . amLODipine (NORVASC) 10 MG tablet Take 10 mg by mouth every morning.     . Biotin  10000 MCG TABS Take 1 tablet by mouth daily.    . calcium-vitamin D (OSCAL WITH D) 500-200 MG-UNIT per tablet Take 1 tablet by mouth every morning.    . ezetimibe (ZETIA) 10 MG tablet Take 10 mg by mouth daily.     . fish oil-omega-3 fatty acids 1000 MG capsule Take 1 g by mouth every morning.     . folic acid (FOLVITE) 1 MG tablet Take 2 mg by mouth every morning.     . gabapentin (NEURONTIN) 100 MG capsule Take 1 capsule (100 mg total) by mouth 3 (three) times daily. 90 capsule 1  . hydroxypropyl methylcellulose / hypromellose (ISOPTO TEARS / GONIOVISC) 2.5 % ophthalmic solution Place 1 drop into both eyes daily as needed for dry eyes.    . meloxicam (MOBIC) 15 MG tablet Take 15 mg by mouth daily.  11  . metFORMIN (GLUCOPHAGE) 500 MG tablet Take 500 mg by mouth daily.     . metoprolol succinate (TOPROL-XL) 50 MG 24 hr tablet TAKE 1 TABLET AT BEDTIME ONCE A DAY ORALLY 90 DAYS  2  . ondansetron (ZOFRAN) 8 MG tablet Take 1 tablet (8 mg total) by mouth 2 (two) times daily as needed (Nausea or vomiting). 30 tablet 1  . pantoprazole (PROTONIX) 40 MG tablet TAKE 1 TABLET (40 MG TOTAL) BY MOUTH DAILY. 30 tablet 0  . pregabalin (LYRICA) 75 MG capsule Take 75 mg by mouth at bedtime.     . prochlorperazine (COMPAZINE) 10 MG tablet Take 1 tablet (10 mg total) by mouth every 6 (six) hours as needed (Nausea or vomiting). 30 tablet 1  . valsartan-hydrochlorothiazide (DIOVAN-HCT) 320-12.5 MG per tablet Take 1 tablet by mouth daily.    . vitamin B-12 (CYANOCOBALAMIN) 1000 MCG tablet Take 1,000 mcg by mouth daily.    . vitamin E 400 UNIT capsule Take 400 Units by mouth daily.    [name] acetaminophen (TYLENOL) 500 MG tablet Take 500 mg by mouth every 6 (six) hours as needed (for pain/fever.).     [name] clonazePAM (KLONOPIN) 0.5 MG tablet Take 0.5 mg by mouth at bedtime.    . lidocaine-prilocaine (EMLA) cream Apply to affected area once (Patient not taking: Reported on 03/20/2016) 30 g 3    Results for orders placed or  performed during the hospital encounter of 03/25/16 (from the past 48 hour(s))  Glucose, capillary     Status: None   Collection Time: 03/25/16  7:08 AM  Result Value Ref Range   Glucose-Capillary 85 65 - 99 mg/dL   No results found.  ROS Negative  Blood pressure 118/69, pulse 78, temperature 98 F (36.7  C), temperature source Oral, resp. rate 18, height 5\' 3"  (1.6 m), weight 99.3 kg (219 lb), SpO2 98 %. Physical Exam  cv rrr pulm clear bilaterally Port in place  Assessment/Plan Breast cancer  Port removal  [place], MD 03/25/2016, 8:10 AM

## 2016-03-25 NOTE — Anesthesia Postprocedure Evaluation (Signed)
Anesthesia Post Note  Patient: Julie [name]  Procedure(s) Performed: Procedure(s) (LRB): REMOVAL PORT-A-CATH (N/A)  Patient location during evaluation: PACU Anesthesia Type: MAC Level of consciousness: awake and alert Pain management: pain level controlled Vital Signs Assessment: post-procedure vital signs reviewed and stable Respiratory status: spontaneous breathing and respiratory function stable Cardiovascular status: stable Anesthetic complications: no    Last Vitals:  Vitals:   03/25/16 0945 03/25/16 0958  BP: 138/75 (!) 141/91  Pulse: (!) 58 (!) 57  Resp: 15 12  Temp: 36.3 C 36.4 C    Last Pain:  Vitals:   03/25/16 0945  TempSrc:   PainSc: 0-No pain                 Allante Whitmire DANIEL

## 2016-03-25 NOTE — Interval H&P Note (Signed)
History and Physical Interval Note:  03/25/2016 8:12 AM  Julie [name]  has presented today for surgery, with the diagnosis of BREAST CANCER  The various methods of treatment have been discussed with the patient and family. After consideration of risks, benefits and other options for treatment, the patient has consented to  Procedure(s): REMOVAL PORT-A-CATH (N/A) as a surgical intervention .  The patient's history has been reviewed, patient examined, no change in status, stable for surgery.  I have reviewed the patient's chart and labs.  Questions were answered to the patient's satisfaction.     Wynona Duhamel

## 2016-03-25 NOTE — Transfer of Care (Signed)
Immediate Anesthesia Transfer of Care Note  Patient: Julie [name]  Procedure(s) Performed: Procedure(s): REMOVAL PORT-A-CATH (N/A)  Patient Location: PACU  Anesthesia Type:MAC  Level of Consciousness:  sedated, patient cooperative and responds to stimulation  Airway & Oxygen Therapy:Patient Spontanous Breathing and Patient connected to face mask oxgen  Post-op Assessment:  Report given to PACU RN and Post -op Vital signs reviewed and stable  Post vital signs:  Reviewed and stable  Last Vitals:  Vitals:   03/25/16 0716  BP: 118/69  Pulse: 78  Resp: 18  Temp: 123XX123 C    Complications: No apparent anesthesia complications

## 2016-03-25 NOTE — Discharge Instructions (Signed)
° ° ° ° °  Always review your discharge instruction sheet given to you by the facility where your surgery was performed.   1. A prescription for pain medication may be given to you upon discharge. Take your pain medication as prescribed, if needed. If narcotic pain medicine is not needed, then you make take acetaminophen (Tylenol) or ibuprofen (Advil) as needed.  2. Take your usually prescribed medications unless otherwise directed. 3. If you need a refill on your pain medication, please contact our office. All narcotic pain medicine now requires a paper prescription.  Phoned in and fax refills are no longer allowed by law.  Prescriptions will not be filled after 5 pm or on weekends.  4. You should follow a light diet for the remainder of the day after your procedure. 5. Most patients will experience some mild swelling and/or bruising in the area of the incision. It may take several days to resolve. 6. It is common to experience some constipation if taking pain medication after surgery. Increasing fluid intake and taking a stool softener (such as Colace) will usually help or prevent this problem from occurring. A mild laxative (Milk of Magnesia or Miralax) should be taken according to package directions if there are no bowel movements after 48 hours.  7. Unless discharge instructions indicate otherwise, you may remove your bandages 48 hours after surgery, and you may shower at that time. You may have steri-strips (small white skin tapes) in place directly over the incision.  These strips should be left on the skin for 7-10 days.  If your surgeon used Dermabond (skin glue) on the incision, you may shower in 24 hours.  The glue will flake off over the next 2-3 weeks.  8. If your port is left accessed at the end of surgery (needle left in port), the dressing cannot get wet and should only by changed by a healthcare professional. When the port is no longer accessed (when the needle has been removed), follow step  7.   9. ACTIVITIES:  Limit activity involving your arms for the next 72 hours. Do no strenuous exercise or activity for 1 week. You may drive when you are no longer taking prescription pain medication, you can comfortably wear a seatbelt, and you can maneuver your car. 10.You may need to see your doctor in the office for a follow-up appointment.  Please       check with your doctor.    WHEN TO CALL YOUR DOCTOR [phone]): 1. Fever over 101.0 2. Chills 3. Continued bleeding from incision 4. Increased redness and tenderness at the site 5. Shortness of breath, difficulty breathing   The clinic staff is available to answer your questions during regular business hours. Please dont hesitate to call and ask to speak to one of the nurses or medical assistants for clinical concerns. If you have a medical emergency, go to the nearest emergency room or call 911.  A surgeon from [facility] is always on call at the hospital.     For further information, please visit [email or web address]

## 2016-03-26 ENCOUNTER — Telehealth: Payer: Self-pay | Admitting: *Deleted

## 2016-03-26 NOTE — Telephone Encounter (Signed)
Please schedule her for lab and see me at 11:30am tomorrow. Let her know it's OK to take Tylenol or NSADs for pain now.   Thanks   Truitt Merle MD

## 2016-03-26 NOTE — Telephone Encounter (Signed)
"  I need an appointment tomorrow with Dr. Burr Medico.  I've received my last chemotherapy but my left hip hurts.  Dr. [name] removed my port-a-cath told me to contact Dr. Burr Medico about this pain.  It really started hurting Tuesday.  It really hurts when I walk, eight out of ten on pain scale.  Feels like a pressure on the left hip so no walking unless I have to.  Have not taken anything for pain.     I walk with a cane, history right total knee.  I fell in radiation Monday.  I was asked if I could get up.  The tech and two others helped me up into a wheelchair and proceeded with my treatment.  I walk like a drunken sailor." Will notify Dr. Burr Medico.  Return number [id number] or mobile [phone].

## 2016-03-26 NOTE — Telephone Encounter (Signed)
Spoke with daughter [name], and gave her appts for pt on Fri 03/27/16  -   11 am lab, visit with Dr. Burr Medico at 87 am.  [name] voiced understanding.

## 2016-03-27 ENCOUNTER — Telehealth: Payer: Self-pay | Admitting: Hematology

## 2016-03-27 ENCOUNTER — Other Ambulatory Visit (HOSPITAL_BASED_OUTPATIENT_CLINIC_OR_DEPARTMENT_OTHER): Payer: Medicare Other

## 2016-03-27 ENCOUNTER — Encounter: Payer: Self-pay | Admitting: Hematology

## 2016-03-27 ENCOUNTER — Other Ambulatory Visit: Payer: Self-pay | Admitting: Hematology

## 2016-03-27 ENCOUNTER — Ambulatory Visit (HOSPITAL_COMMUNITY)
Admission: RE | Admit: 2016-03-27 | Discharge: 2016-03-27 | Disposition: A | Discharge status: Home / Self Care | Payer: Medicare Other | Source: Ambulatory Visit | Attending: Hematology | Admitting: Hematology

## 2016-03-27 ENCOUNTER — Ambulatory Visit (HOSPITAL_BASED_OUTPATIENT_CLINIC_OR_DEPARTMENT_OTHER): Payer: Medicare Other | Admitting: Hematology

## 2016-03-27 VITALS — BP 154/70 | HR 63 | Temp 98.1°F | Resp 17 | Ht 63.0 in | Wt 220.8 lb

## 2016-03-27 DIAGNOSIS — M545 Low back pain: Secondary | ICD-10-CM | POA: Diagnosis not present

## 2016-03-27 DIAGNOSIS — M7918 Myalgia, other site: Secondary | ICD-10-CM

## 2016-03-27 DIAGNOSIS — M5136 Other intervertebral disc degeneration, lumbar region: Secondary | ICD-10-CM | POA: Diagnosis not present

## 2016-03-27 DIAGNOSIS — G620 Drug-induced polyneuropathy: Secondary | ICD-10-CM | POA: Diagnosis not present

## 2016-03-27 DIAGNOSIS — D649 Anemia, unspecified: Secondary | ICD-10-CM | POA: Diagnosis not present

## 2016-03-27 DIAGNOSIS — Z171 Estrogen receptor negative status [ER-]: Secondary | ICD-10-CM | POA: Diagnosis not present

## 2016-03-27 DIAGNOSIS — M25552 Pain in left hip: Secondary | ICD-10-CM | POA: Diagnosis not present

## 2016-03-27 DIAGNOSIS — M791 Myalgia: Secondary | ICD-10-CM | POA: Diagnosis present

## 2016-03-27 DIAGNOSIS — C50412 Malignant neoplasm of upper-outer quadrant of left female breast: Secondary | ICD-10-CM

## 2016-03-27 DIAGNOSIS — D472 Monoclonal gammopathy: Secondary | ICD-10-CM

## 2016-03-27 LAB — CBC WITH DIFFERENTIAL/PLATELET
BASO%: 0.8 % (ref 0.0–2.0)
BASOS ABS: 0.1 10*3/uL (ref 0.0–0.1)
EOS%: 2.6 % (ref 0.0–7.0)
Eosinophils Absolute: 0.2 10*3/uL (ref 0.0–0.5)
HCT: 32.2 % — ABNORMAL LOW (ref 34.8–46.6)
HGB: 10.2 g/dL — ABNORMAL LOW (ref 11.6–15.9)
LYMPH%: 21.2 % (ref 14.0–49.7)
MCH: 27.8 pg (ref 25.1–34.0)
MCHC: 31.7 g/dL (ref 31.5–36.0)
MCV: 87.7 fL (ref 79.5–101.0)
MONO#: 1 10*3/uL — ABNORMAL HIGH (ref 0.1–0.9)
MONO%: 13.6 % (ref 0.0–14.0)
NEUT#: 4.4 10*3/uL (ref 1.5–6.5)
NEUT%: 61.8 % (ref 38.4–76.8)
Platelets: 252 10*3/uL (ref 145–400)
RBC: 3.67 10*6/uL — ABNORMAL LOW (ref 3.70–5.45)
RDW: 17.7 % — ABNORMAL HIGH (ref 11.2–14.5)
WBC: 7.2 10*3/uL (ref 3.9–10.3)
lymph#: 1.5 10*3/uL (ref 0.9–3.3)

## 2016-03-27 LAB — COMPREHENSIVE METABOLIC PANEL
ALK PHOS: 66 U/L (ref 40–150)
ALT: 11 U/L (ref 0–55)
AST: 18 U/L (ref 5–34)
Albumin: 3.5 g/dL (ref 3.5–5.0)
Anion Gap: 10 mEq/L (ref 3–11)
BUN: 31.9 mg/dL — ABNORMAL HIGH (ref 7.0–26.0)
CO2: 26 meq/L (ref 22–29)
Calcium: 9.6 mg/dL (ref 8.4–10.4)
Chloride: 109 mEq/L (ref 98–109)
Creatinine: 1 mg/dL (ref 0.6–1.1)
EGFR: 63 mL/min/{1.73_m2} — AB (ref >90)
GLUCOSE: 86 mg/dL (ref 70–140)
POTASSIUM: 4.7 meq/L (ref 3.5–5.1)
SODIUM: 145 meq/L (ref 136–145)
Total Bilirubin: 0.58 mg/dL (ref 0.20–1.20)
Total Protein: 6.6 g/dL (ref 6.4–8.3)

## 2016-03-27 MED ORDER — ACETAMINOPHEN-CODEINE #3 300-30 MG PO TABS: 1.0000 | ORAL_TABLET | Freq: Four times a day (QID) | ORAL | 0 refills | Status: DC | PRN

## 2016-03-27 NOTE — Progress Notes (Addendum)
[place]  [phone] Fax:([phone]) [phone]  Clinic follow up Note   Patient Care Team: [name], MD as PCP - General (Internal Medicine) Valinda Party, MD as Referring Physician (Rheumatology) 03/27/2016  CHIEF COMPLAINTS:  Follow up left breast cancer   Oncology History   Malignant neoplasm of upper outer quadrant of female breast [facility])   Staging form: Breast, AJCC 7th Edition     Pathologic stage from 11/14/2015: Stage IA (T1c, N0, cM0) - Signed by Truitt Merle, MD on 11/25/2015     Clinical stage from 11/20/2015: Stage IA (T1c, N0, M0) - Signed by Curt Bears, MD on 11/20/2015       Malignant neoplasm of upper outer quadrant of female breast ([place])   10/09/2015 Mammogram    Screening bilateral mammograms showed a possible left breast mass. Diagnostic mammogram and ultrasound showed a 7 x 7 x 7 mm mass in the left breast at 11:00 position.      10/22/2015 Receptors her2    ER negative, PR negative, HER-2 not amplified      10/22/2015 Initial Biopsy    Left breast needle core biopsy at the 1:00 position showed invasive ductal carcinoma.      10/22/2015 Initial Diagnosis    Malignant neoplasm of upper outer quadrant of female breast ([place])      11/14/2015 Surgery    Left breast lumpectomy and sentinel lymph node biopsy      11/14/2015 Pathology Results    Left breast lumpectomy showed invasive ductal carcinoma, grade 3, 1.1 cm, resection margins were negative, sentinel lymph node biopsy showed benign breast parenchyma, lymph node tissue not identified. Repeated ER PR and HER-2 were all negative.      12/20/2015 -  Chemotherapy    Weekly Taxol 80 mg/m, changed to Abraxane 100 mg/m from week 3 due to infusion reaction, and dose reduced from week 9 due to neuropathy.       HISTORY OF PRESENTING ILLNESS:  Julie [name] 82 y.o. female is here because of her recently diagnosed left breast cancer. She has been seen my partner Dr. Julien Nordmann for MGUS,  and was referred to me to discuss her adjuvant therapy for breast cancer. She is accompanied by her daughter to the clinic today.  Her breast cancer was discovered by screening mammogram. She has no palpable breast mass, skin change or nipple discharge. She denies any new constitutional symptoms. The diagnostic mammogram and ultrasound showed a 7 mm mass in the left breast, biopsy showed invasive ductal carcinoma, triple negative. She was seen by breast surgeon Dr. [name], and underwent left rest lumpectomy and sentinel lymph node biopsy on 11/14/2015.   She had a multiple orthopedic surgeries in the past, takes tramadol as needed. She lives with her daughter, pretty independent, does not exercise regularly, but it goes out for shopping, playing cards, etc.  GYN HISTORY  Menarchal: 16 LMP: 68 (hysrectomy)  Contraceptive: 2 HRT: 1 yr  G5P5: she did breast feeding to 2 children   CURRENT THERAPY: weekly Taxol 3m/m2, started on 12/20/2015, changed to Abraxane 1067mm2 weekly from week 3 due to infusion reaction, and dose reduction from week 9 due to neuropathy  INTERIM HISTORY: NoMeridetheturns for follow-up. States that her butt is hurting on the left side. This pain is getting worse with a burning sensation and beginning to radiate towards her anterior groin. It feels as though there is pressure squeezing down on her back and rates it a constant 8/10 on  pain scale. She is not taking medication to relieve this and has not had any imaging studies since her fall. This pain does not radiate past her knee. Her low back does not hurt with palpation. She denies current hip or back problems. Years prior she did have back problems but has not since receiving acupuncture about 5 years ago. She has been using a cane for years for balance. She showed a picture of bruising on her left leg after a previous fall - this bruising has resolved. She had her port removed two days ago. She is unable to wear closed toe  shoes due to toe nail sensitivity. She also reports shooting pain in her breast since surgery.   MEDICAL HISTORY:  Past Medical History:  Diagnosis Date  . Anemia   . Anxiety    takes Klonopin at bedtime  . Arthritis    takes Methotrexate weekly and Prednisone  . Blood transfusion    no abnormal reaction noted  . Cataracts, bilateral    immature  . Complication of anesthesia    wakes up during surgery  . Depression   . Diabetes mellitus without complication ([place])   . Dyspnea    with exertion since chemo   . Fibromyalgia    takes Lyrica daily  . GERD (gastroesophageal reflux disease)    takes Nexium daily  . Glaucoma   . Hard of hearing   . Heart murmur   . Histoplasmosis   . History of colon polyps   . History of gout   . History of hiatal hernia   . History of shingles   . Hyperlipidemia   . Hypertension    takes Amlodipine,Metoprolol,and Losartan daily  . Incontinence of bowel   . Interstitial cystitis   . Joint pain   . Nocturia   . Peripheral neuropathy ([place])   . PONV (postoperative nausea and vomiting)   . Ulcer ([place]) 1970   duodenal  . Urinary frequency   . Urinary urgency   . Weakness    numbness in both hands and feet    SURGICAL HISTORY: Past Surgical History:  Procedure Laterality Date  . ABDOMINAL HYSTERECTOMY    . accupuncture  3 yrs ago  . ANKLE FUSION Left 05/10/2014   Procedure: LEFT ANKLE ARTHRODESIS ;  Surgeon: Wylene Simmer, MD;  Location: [facility];  Service: Orthopedics;  Laterality: Left;  . ANKLE SURGERY Left   . arm surgery Left    radius  . bladder tacked     . CERVICAL FUSION    . CHOLECYSTECTOMY    . COLONOSCOPY    . CYSTOCELE REPAIR    . DILATION AND CURETTAGE OF UTERUS    . ESOPHAGOGASTRODUODENOSCOPY    . PORT A CATH REVISION N/A 12/27/2015   Procedure: PORT A CATH REVISION;  Surgeon: Rolm Bookbinder, MD;  Location: [place];  Service: General;  Laterality: N/A;  . PORT-A-CATH REMOVAL N/A 03/25/2016    Procedure: REMOVAL PORT-A-CATH;  Surgeon: Rolm Bookbinder, MD;  Location: [facility];  Service: General;  Laterality: N/A;  . PORTACATH PLACEMENT Right 12/27/2015   Procedure: INSERTION PORT-A-CATH WITH ULTRASOUND ;  Surgeon: Rolm Bookbinder, MD;  Location: [facility] [facility];  Service: General;  Laterality: Right;  . RADIOACTIVE SEED GUIDED MASTECTOMY WITH AXILLARY SENTINEL LYMPH NODE BIOPSY Left 11/14/2015   Procedure: RADIOACTIVE SEED GUIDED LEFT BREAST LUMPECTOMY WITH AXILLARY SENTINEL LYMPH NODE BIOPSY;  Surgeon: Rolm Bookbinder, MD;  Location: [place];  Service: General;  Laterality: Left;  RADIOACTIVE SEED GUIDED LEFT BREAST LUMPECTOMY WITH AXILLARY SENTINEL LYMPH NODE BIOPSY  . SALPINGOOPHORECTOMY Bilateral   . TOTAL KNEE ARTHROPLASTY Right 02/28/2015   Procedure: RIGHT TOTAL KNEE ARTHROPLASTY;  Surgeon: Rod Can, MD;  Location: [facility];  Service: Orthopedics;  Laterality: Right;  . UPPER GASTROINTESTINAL ENDOSCOPY    . VAGINAL HYSTERECTOMY      SOCIAL HISTORY: Social History   Social History  . Marital status: Divorced    Spouse name: N/A  . Number of children: 5  . Years of education: N/A   Occupational History  . Not on file.   Social History Main Topics  . Smoking status: Never Smoker  . Smokeless tobacco: Never Used  . Alcohol use No     Comment: IN [place] ONLY  . Drug use: No  . Sexual activity: Not on file   Other Topics Concern  . Not on file   Social History Narrative  . No narrative on file    FAMILY HISTORY: Family History  Problem Relation Age of Onset  . Colon cancer Sister 35  . Esophageal cancer Brother 32  . Breast cancer Mother   . Brain cancer Other     ALLERGIES:  is allergic to other and aspirin.  MEDICATIONS:  Current Outpatient Prescriptions  Medication Sig Dispense Refill  . acetaminophen (TYLENOL) 500 MG tablet Take 500 mg by mouth every 6 (six) hours as needed (for pain/fever.).     [name] amLODipine  (NORVASC) 10 MG tablet Take 10 mg by mouth every morning.     . Biotin 10000 MCG TABS Take 1 tablet by mouth daily.    . calcium-vitamin D (OSCAL WITH D) 500-200 MG-UNIT per tablet Take 1 tablet by mouth every morning.    . clonazePAM (KLONOPIN) 0.5 MG tablet Take 0.5 mg by mouth at bedtime.    [name] ezetimibe (ZETIA) 10 MG tablet Take 10 mg by mouth daily.     . fish oil-omega-3 fatty acids 1000 MG capsule Take 1 g by mouth every morning.     . folic acid (FOLVITE) 1 MG tablet Take 2 mg by mouth every morning.     . gabapentin (NEURONTIN) 100 MG capsule Take 1 capsule (100 mg total) by mouth 3 (three) times daily. 90 capsule 1  . hydroxypropyl methylcellulose / hypromellose (ISOPTO TEARS / GONIOVISC) 2.5 % ophthalmic solution Place 1 drop into both eyes daily as needed for dry eyes.    [name] lidocaine-prilocaine (EMLA) cream Apply to affected area once 30 g 3  . meloxicam (MOBIC) 15 MG tablet Take 15 mg by mouth daily.  11  . metFORMIN (GLUCOPHAGE) 500 MG tablet Take 500 mg by mouth daily.     . metoprolol succinate (TOPROL-XL) 50 MG 24 hr tablet TAKE 1 TABLET AT BEDTIME ONCE A DAY ORALLY 90 DAYS  2  . ondansetron (ZOFRAN) 8 MG tablet Take 1 tablet (8 mg total) by mouth 2 (two) times daily as needed (Nausea or vomiting). 30 tablet 1  . pantoprazole (PROTONIX) 40 MG tablet TAKE 1 TABLET (40 MG TOTAL) BY MOUTH DAILY. 30 tablet 0  . pregabalin (LYRICA) 75 MG capsule Take 75 mg by mouth at bedtime.     . prochlorperazine (COMPAZINE) 10 MG tablet Take 1 tablet (10 mg total) by mouth every 6 (six) hours as needed (Nausea or vomiting). 30 tablet 1  . valsartan-hydrochlorothiazide (DIOVAN-HCT) 320-12.5 MG per tablet Take 1 tablet by mouth daily.    . vitamin B-12 (CYANOCOBALAMIN)  1000 MCG tablet Take 1,000 mcg by mouth daily.    . vitamin E 400 UNIT capsule Take 400 Units by mouth daily.    [name] acetaminophen-codeine (TYLENOL #3) 300-30 MG tablet Take 1 tablet by mouth every 6 (six) hours as needed for moderate  pain. 15 tablet 0   No current facility-administered medications for this visit.     REVIEW OF SYSTEMS:   Constitutional: Denies fevers, chills or abnormal night sweats Eyes: Denies blurriness of vision, double vision or watery eyes Ears, nose, mouth, throat, and face: Denies mucositis or sore throat Respiratory: Denies cough, dyspnea or wheezes Cardiovascular: Denies palpitation, chest discomfort or lower extremity swelling Gastrointestinal:  Denies nausea, heartburn or change in bowel habits Skin: Denies abnormal skin rashes Musculoskeletal: (+) back pain, left hip pain Lymphatics: Denies new lymphadenopathy or easy bruising Neurological:Denies numbness, tingling or new weaknesses Behavioral/Psych: Mood is stable, no new changes  All other systems were reviewed with the patient and are negative.   PHYSICAL EXAMINATION: ECOG PERFORMANCE STATUS: 1 - Symptomatic but completely ambulatory Vitals:   03/27/16 1200  BP: (!) 154/70  Pulse: 63  Resp: 17  Temp: 98.1 F (36.7 C)   GENERAL:alert, no distress and comfortable SKIN: skin color, texture, turgor are normal, no rashes or significant lesions. Her right 3rd toe nail is loose, no significant other nail changes. There is a boil above her viginal area  EYES: normal, conjunctiva are pink and non-injected, sclera clear OROPHARYNX:no exudate, no erythema and lips, buccal mucosa, and tongue normal  NECK: supple, thyroid normal size, non-tender, without nodularity LYMPH:  no palpable lymphadenopathy in the cervical, axillary or inguinal LUNGS: clear to auscultation and percussion with normal breathing effort HEART: regular rate & rhythm and no murmurs and no lower extremity edema ABDOMEN:abdomen soft, non-tender and normal bowel sounds Musculoskeletal:no cyanosis of digits and no clubbing. No tenderness with palpation of lower back or left hip PSYCH: alert & oriented x 3 with fluent speech NEURO: no focal motor/sensory deficits, except  mild decreased vibration sensation on her hands    LABORATORY DATA:  I have reviewed the data as listed CBC Latest Ref Rng & Units 03/27/2016 03/13/2016 03/06/2016  WBC 3.9 - 10.3 10e3/uL 7.2 3.1(L) 3.4(L)  Hemoglobin 11.6 - 15.9 g/dL 10.2(L) 10.0(L) 9.3(L)  Hematocrit 34.8 - 46.6 % 32.2(L) 30.9(L) 29.1(L)  Platelets 145 - 400 10e3/uL 252 269 260   CMP Latest Ref Rng & Units 03/27/2016 03/13/2016 03/06/2016  Glucose 70 - 140 mg/dl 86 78 90  BUN 7.0 - 26.0 mg/dL 31.9(H) 30.1(H) 27.4(H)  Creatinine 0.6 - 1.1 mg/dL 1.0 1.1 0.9  Sodium 136 - 145 mEq/L 145 142 142  Potassium 3.5 - 5.1 mEq/L 4.7 3.9 3.8  Chloride 101 - 111 mmol/L - - -  CO2 22 - 29 mEq/L _0 Calcium 8.4 - 10.4 mg/dL 9.6 9.4 9.3  Total Protein 6.4 - 8.3 g/dL 6.6 6.8 6.6  Total Bilirubin 0.20 - 1.20 mg/dL 0.58 0.55 0.61  Alkaline Phos 40 - 150 U/L 66 67 68  AST 5 - 34 U/L _1 ALT 0 - 55 U/L 11 9 <6   ANC 2.1 today   PATHOLOGY REPORT  Diagnosis 11/14/2015 1. Breast, lumpectomy, Left - INVASIVE DUCTAL CARCINOMA, GRADE III/III, SPANNING 1.1 CM. - THE RESECTION MARGINS ARE NEGATIVE FOR CARCINOMA. - SEE ONCOLOGY TABLE BELOW. 2. Lymph node, sentinel, biopsy, Left Axillary - BENIGN BREAST PARENCHYMA. - LYMPH NODAL TISSUE IS NOT IDENTIFIED. - THERE IS  NO EVIDENCE OF MALIGNANCY. Microscopic Comment 1. BREAST, INVASIVE TUMOR, WITH LYMPH NODES PRESENT Specimen, including laterality and lymph node sampling (sentinel, non-sentinel): Left breast Procedure: Seed localized lumpectomy Histologic type: Ductal Grade: III Tubule formation: 2 Nuclear pleomorphism: 3 Mitotic: 3 Tumor size (gross measurement): 1.1 cm Margins: Greater than 0.2 cm to all margins Lymphovascular invasion: Not identified Ductal carcinoma in situ: Not identified. Lobular neoplasia: Not identified. Tumor focality: Unifocal Treatment effect: N/A Extent of tumor: Confined to breast parenchyma Lymph nodes: None examined. Breast prognostic  profile: Case [id number] Estrogen receptor: 0% Progesterone receptor: 0% Her 2 neu: No amplification was detected. Ki-67: 80% A breast prognostic profile will be repeated on the case and the results reported separately. Non-neoplastic breast: No significant findings. TNM: pT1c, pNX (JBK:kh 11-15-15) 2 of 4 FINAL for Roemer, Jermany L ([id number]) Enid Cutter MD Pathologist, Electronic  Results: IMMUNOHISTOCHEMICAL AND MORPHOMETRIC ANALYSIS PERFORMED MANUALLY Estrogen Receptor: 0%, NEGATIVE Progesterone Receptor: 0%, NEGATIVE  Results: HER2 - NEGATIVE RATIO OF HER2/CEP17 SIGNALS 1.32 AVERAGE HER2 COPY NUMBER PER CELL 3.10   RADIOGRAPHIC STUDIES: I have personally reviewed the radiological images as listed and agreed with the findings in the report. Dg Lumbar Spine Complete  Result Date: 03/27/2016 CLINICAL DATA:  Low back pain for 3 weeks, no known injury, initial encounter EXAM: LUMBAR SPINE - COMPLETE 4+ VIEW COMPARISON:  03/12/2016 FINDINGS: Five lumbar type vertebral bodies are again well visualized. Vertebral body height is well maintained. Multilevel facet hypertrophic changes are noted. Mild anterolisthesis of L4-5 of a degenerative nature is again identified. Disc space narrowing is noted from L2-S1. No significant soft tissue abnormality is noted aside from aortic calcifications. IMPRESSION: Multilevel degenerative change stable from the prior exam. No acute abnormality noted. Electronically Signed   By: Inez Catalina M.D.   On: 03/27/2016 13:40   Dg Hip Unilat With Pelvis 2-3 Views Left  Result Date: 03/27/2016 CLINICAL DATA:  Left hip pain for several weeks, no known injury, initial encounter EXAM: DG HIP (WITH OR WITHOUT PELVIS) 2-3V LEFT COMPARISON:  None. FINDINGS: The pelvic ring is intact. Mild degenerative changes of the hip joints are noted. Degenerative change of the pubic symphysis is seen. No acute fracture or dislocation is noted. No soft tissue abnormality is  seen. IMPRESSION: No acute abnormality noted. Electronically Signed   By: Inez Catalina M.D.   On: 03/27/2016 13:40    ASSESSMENT & PLAN:  82 year old female, with past medical history of MGUS, hypertension, arthritis, with good performance status, was found to have stage I triple negative left breast cancer by screening mammogram.  1. Malignant or new present of upper outer quadrant of left female breast, invasive ductal carcinoma, grade 3, pT1cN0M0, stage IA, ER-/PR-/HER2- --I discussed her breast mammogram, ultrasound, biopsy and final surgical path result in details -She had sentinel lymph node biopsy, however it showed normal breast parenchyma, no lymphoid tissue, her pathological node status is unknown, no ultrasound evidence of adenopathy. -We reviewed the aggressive nature of triple negative breast cancer. Giving her stage I disease, I think she probably has 20-30% risk of distant recurrence in the next 5-10 years.  -I recommend weekly Taxol for 12 weeks as adjuvant chemotherapy to reduce her risk of cancer recurrence. -She has been tolerating chemo well, Taxol was changed to Abraxane from week 3 due to infusion reaction. . -She is tolerating chemotherapy well, except neuropathy. I'll further decrease her Abraxane to 82m/m2 for the last two weeks treatments  2. MGUS -will continue follow up SPEP/IFE,  and light chain levels every 3 months  3. Anemia  -she has chronic anemia, worse last year due to her surgery  -Overall stable, we'll continue monitoring  4. Joint and muscular pain, Rheumatoid arthritis -She has constant low back and left hip pain, possible related to Taxol and RA (she is off MTX and prednisone now)  -I prescribed Tylenol #3 today -While undergoing XRT, she was discontinued from methotrexate previously prescribed by her rheumatologist Dr. Dossie Der. After XRT is completed, we will discuss restarting this medication. -I will send Dr. Dossie Der a copy of our note today. I encourage  pt to follow up with Dr. Dossie Der  -I encouraged her to take the calcium and magnesium over-the-counter, drink fluids adequately.  -We'll continue monitoring   5. Mild peripheral neuropathy, G1 -No impact on her function, we'll continue monitor closely -We'll decrease her Abraxane dose for the last 2 treatments -She has mild gait instability due to neuropathy and fatigue, I recommend her to get physical therapy, she is interested.  Plan -She will continue with XRT with Dr. Sondra Come, if he agrees, pt can restart prednisone 52m daily  -I will send a copy of our note today to her rheumatologist Dr. SDossie Der-I have ordered X-ray of lower back and left hip for later today -I called in Tylenol #3 to her pharmacy -I'll see her back on 11/30 after she has completed XRT. The current follow up appointment scheduled on 11/10 will be cancelled.   All questions were answered. The patient knows to call the clinic with any problems, questions or concerns.  I spent 20 minutes counseling the patient face to face. The total time spent in the appointment was 25 minutes and more than 50% was on counseling.  This document serves as a record of services personally performed by YTruitt Merle MD. It was created on her behalf by EArlyce Harman a trained medical scribe. The creation of this record is based on the scribe's personal observations and the provider's statements to them. This document has been checked and approved by the attending provider.     FTruitt Merle MD 03/27/2016   Addendum I called pt and informed her x-ray results from yesterday. No fracture, some degenerative changes in lumbar spine.  She voiced good understanding.  FTruitt Merle

## 2016-03-27 NOTE — Telephone Encounter (Signed)
Lab, Follow up visit and Infusion cancelled for 04/03/16 per 03/27/16 los. Appointments scheduled per 03/27/16 los. AVS report and appointment schedule given to patient per 03/27/16 los.

## 2016-03-30 ENCOUNTER — Ambulatory Visit: Payer: Self-pay | Admitting: Hematology

## 2016-03-30 ENCOUNTER — Other Ambulatory Visit: Payer: Self-pay

## 2016-03-30 ENCOUNTER — Encounter: Payer: Self-pay | Admitting: *Deleted

## 2016-03-30 ENCOUNTER — Ambulatory Visit: Payer: Medicare Other

## 2016-03-30 DIAGNOSIS — M549 Dorsalgia, unspecified: Secondary | ICD-10-CM | POA: Diagnosis not present

## 2016-03-30 DIAGNOSIS — M0579 Rheumatoid arthritis with rheumatoid factor of multiple sites without organ or systems involvement: Secondary | ICD-10-CM | POA: Diagnosis not present

## 2016-03-30 DIAGNOSIS — Z171 Estrogen receptor negative status [ER-]: Secondary | ICD-10-CM | POA: Diagnosis not present

## 2016-03-30 DIAGNOSIS — C50412 Malignant neoplasm of upper-outer quadrant of left female breast: Secondary | ICD-10-CM | POA: Diagnosis not present

## 2016-03-30 DIAGNOSIS — Z510 Encounter for antineoplastic radiation therapy: Secondary | ICD-10-CM | POA: Diagnosis not present

## 2016-03-30 DIAGNOSIS — M6281 Muscle weakness (generalized): Secondary | ICD-10-CM | POA: Diagnosis not present

## 2016-03-30 DIAGNOSIS — Z79899 Other long term (current) drug therapy: Secondary | ICD-10-CM | POA: Diagnosis not present

## 2016-03-30 NOTE — Progress Notes (Signed)
[facility] Psychosocial Distress Screening Clinical Social Work  Clinical Social Work was referred by distress screening protocol.  The patient scored a 5 on the Psychosocial Distress Thermometer which indicates moderate distress. Clinical Social Worker contacted patient at home to assess for distress and other psychosocial needs.  CSW left a message offering support and information on the support team at [facility].  CSW encouraged patient to call with questions or concerns.   ONCBCN DISTRESS SCREENING 03/18/2016  Screening Type Initial Screening  Distress experienced in past week (1-10) 5  Emotional problem type Nervousness/Anxiety;Adjusting to illness;Feeling hopeless  Spiritual/Religous concerns type Facing my mortality  Physical Problem type Tingling hands/feet;Skin dry/itchy    Julie [name], MSW, LCSW, OSW-C Clinical Social Worker [place] [facility] [phone]

## 2016-03-31 ENCOUNTER — Ambulatory Visit
Admission: RE | Admit: 2016-03-31 | Discharge: 2016-03-31 | Disposition: A | Discharge status: Home / Self Care | Payer: Medicare Other | Source: Ambulatory Visit | Attending: Radiation Oncology | Admitting: Radiation Oncology

## 2016-03-31 DIAGNOSIS — C50412 Malignant neoplasm of upper-outer quadrant of left female breast: Secondary | ICD-10-CM | POA: Diagnosis not present

## 2016-03-31 DIAGNOSIS — Z171 Estrogen receptor negative status [ER-]: Secondary | ICD-10-CM | POA: Diagnosis not present

## 2016-03-31 DIAGNOSIS — Z510 Encounter for antineoplastic radiation therapy: Secondary | ICD-10-CM | POA: Diagnosis not present

## 2016-03-31 NOTE — Progress Notes (Signed)
  Radiation Oncology         [phone] ________________________________  Name: Julie [name] MRN: [id number]  Date: 03/31/2016  DOB: [birth year]  Simulation Verification Note    ICD-9-CM ICD-10-CM   1. Malignant neoplasm of upper-outer quadrant of left breast in female, estrogen receptor negative ([place]) 174.4 C50.412    V86.1 Z17.1     Status: outpatient  NARRATIVE: The patient was brought to the treatment unit and placed in the planned treatment position. The clinical setup was verified. Then port films were obtained and uploaded to the radiation oncology medical record software.  The treatment beams were carefully compared against the planned radiation fields. The position location and shape of the radiation fields was reviewed. They targeted volume of tissue appears to be appropriately covered by the radiation beams. Organs at [name] appear to be excluded as planned.  Based on my personal review, I approved the simulation verification. The patient's treatment will proceed as planned.  -----------------------------------  Blair Promise, PhD, MD

## 2016-04-01 ENCOUNTER — Ambulatory Visit
Admission: RE | Admit: 2016-04-01 | Discharge: 2016-04-01 | Disposition: A | Discharge status: Home / Self Care | Payer: Medicare Other | Source: Ambulatory Visit | Attending: Radiation Oncology | Admitting: Radiation Oncology

## 2016-04-01 ENCOUNTER — Ambulatory Visit: Payer: Medicare Other | Attending: Hematology

## 2016-04-01 DIAGNOSIS — Z510 Encounter for antineoplastic radiation therapy: Secondary | ICD-10-CM | POA: Diagnosis not present

## 2016-04-01 DIAGNOSIS — C50412 Malignant neoplasm of upper-outer quadrant of left female breast: Secondary | ICD-10-CM | POA: Diagnosis not present

## 2016-04-01 DIAGNOSIS — Z171 Estrogen receptor negative status [ER-]: Secondary | ICD-10-CM | POA: Diagnosis not present

## 2016-04-02 ENCOUNTER — Ambulatory Visit
Admission: RE | Admit: 2016-04-02 | Discharge: 2016-04-02 | Disposition: A | Discharge status: Home / Self Care | Payer: Medicare Other | Source: Ambulatory Visit | Attending: Radiation Oncology | Admitting: Radiation Oncology

## 2016-04-02 DIAGNOSIS — Z171 Estrogen receptor negative status [ER-]: Secondary | ICD-10-CM | POA: Diagnosis not present

## 2016-04-02 DIAGNOSIS — Z510 Encounter for antineoplastic radiation therapy: Secondary | ICD-10-CM | POA: Diagnosis not present

## 2016-04-02 DIAGNOSIS — C50412 Malignant neoplasm of upper-outer quadrant of left female breast: Secondary | ICD-10-CM | POA: Diagnosis not present

## 2016-04-03 ENCOUNTER — Other Ambulatory Visit: Payer: Self-pay

## 2016-04-03 ENCOUNTER — Ambulatory Visit: Payer: Self-pay | Admitting: Nurse Practitioner

## 2016-04-03 ENCOUNTER — Ambulatory Visit
Admission: RE | Admit: 2016-04-03 | Discharge: 2016-04-03 | Disposition: A | Discharge status: Home / Self Care | Payer: Medicare Other | Source: Ambulatory Visit | Attending: Radiation Oncology | Admitting: Radiation Oncology

## 2016-04-03 ENCOUNTER — Ambulatory Visit: Payer: Self-pay | Admitting: Hematology

## 2016-04-03 DIAGNOSIS — C50412 Malignant neoplasm of upper-outer quadrant of left female breast: Secondary | ICD-10-CM | POA: Diagnosis not present

## 2016-04-03 DIAGNOSIS — Z510 Encounter for antineoplastic radiation therapy: Secondary | ICD-10-CM | POA: Diagnosis not present

## 2016-04-03 DIAGNOSIS — Z171 Estrogen receptor negative status [ER-]: Secondary | ICD-10-CM | POA: Diagnosis not present

## 2016-04-06 ENCOUNTER — Ambulatory Visit: Payer: Medicare Other

## 2016-04-06 ENCOUNTER — Ambulatory Visit
Admission: RE | Admit: 2016-04-06 | Discharge: 2016-04-06 | Disposition: A | Discharge status: Home / Self Care | Payer: Medicare Other | Source: Ambulatory Visit | Attending: Radiation Oncology | Admitting: Radiation Oncology

## 2016-04-06 DIAGNOSIS — Z171 Estrogen receptor negative status [ER-]: Secondary | ICD-10-CM | POA: Diagnosis not present

## 2016-04-06 DIAGNOSIS — C50412 Malignant neoplasm of upper-outer quadrant of left female breast: Secondary | ICD-10-CM | POA: Diagnosis not present

## 2016-04-06 DIAGNOSIS — Z510 Encounter for antineoplastic radiation therapy: Secondary | ICD-10-CM | POA: Diagnosis not present

## 2016-04-07 ENCOUNTER — Ambulatory Visit
Admission: RE | Admit: 2016-04-07 | Discharge: 2016-04-07 | Disposition: A | Discharge status: Home / Self Care | Payer: Medicare Other | Source: Ambulatory Visit | Attending: Radiation Oncology | Admitting: Radiation Oncology

## 2016-04-07 ENCOUNTER — Encounter: Payer: Self-pay | Admitting: Radiation Oncology

## 2016-04-07 VITALS — BP 138/59 | HR 63 | Temp 98.2°F | Ht 63.0 in | Wt 211.0 lb

## 2016-04-07 DIAGNOSIS — C50412 Malignant neoplasm of upper-outer quadrant of left female breast: Secondary | ICD-10-CM

## 2016-04-07 DIAGNOSIS — Z171 Estrogen receptor negative status [ER-]: Secondary | ICD-10-CM | POA: Diagnosis not present

## 2016-04-07 DIAGNOSIS — Z510 Encounter for antineoplastic radiation therapy: Secondary | ICD-10-CM | POA: Diagnosis not present

## 2016-04-07 MED ORDER — ALRA NON-METALLIC DEODORANT (RAD-ONC)
1.0000 "application " | Freq: Once | TOPICAL | Status: AC
  Administered 2016-04-07: 1 via TOPICAL

## 2016-04-07 MED ORDER — RADIAPLEXRX EX GEL
Freq: Once | CUTANEOUS | Status: AC
  Administered 2016-04-07: 14:00:00 via TOPICAL

## 2016-04-07 NOTE — Progress Notes (Signed)
  Radiation Oncology         [phone] ________________________________  Name: Julie [name] MRN: [id number]  Date: 04/07/2016  DOB: 1932/10/25  Weekly Radiation Therapy Management    ICD-9-CM ICD-10-CM   1. Malignant neoplasm of upper-outer quadrant of left breast in female, estrogen receptor negative ([facility]) 174.4 C50.412    V86.1 Z17.1      Current Dose: 9 Gy     Planned Dose:  50.4 Gy  Narrative . . . . . . . . The patient presents for routine under treatment assessment.                                   The patient is without complaint.  No significant fatigue, itching or discomfort within the left breast                                 Set-up films were reviewed.                                 The chart was checked. Physical Findings. . .  height is 5\' 3"  (1.6 m) and weight is 211 lb (95.7 kg). Her oral temperature is 98.2 F (36.8 C). Her blood pressure is 138/59 (abnormal) and her pulse is 63. Her oxygen saturation is 100%. . Weight essentially stable.  Lungs are clear to auscultation bilaterally. Heart has regular rate and rhythm. No palpable cervical, supraclavicular, or axillary adenopathy. Abdomen soft, non-tender, normal bowel sounds. Left breast no significant radiation reaction at this point Impression . . . . . . . The patient is tolerating radiation. Plan . . . . . . . . . . . . Continue treatment as planned.  ________________________________   Blair Promise, PhD, MD

## 2016-04-07 NOTE — Progress Notes (Signed)
Julie [name] has completed 5 fractions to her left breast.  She reports having pain in her lower back due to scholoisis.  She also reports that she fell on her consult day on her right knee.  She denies having fatigue.  The skin on her left breast is intact.    BP (!) 138/59 (BP Location: Right Arm, Patient Position: Sitting)   Pulse 63   Temp 98.2 F (36.8 C) (Oral)   Ht 5\' 3"  (1.6 m)   Wt 211 lb (95.7 kg)   SpO2 100%   BMI 37.38 kg/m    Wt Readings from Last 3 Encounters:  04/07/16 211 lb (95.7 kg)  03/27/16 220 lb 12.8 oz (100.2 kg)  03/25/16 219 lb (99.3 kg)

## 2016-04-08 ENCOUNTER — Telehealth: Payer: Self-pay | Admitting: *Deleted

## 2016-04-08 ENCOUNTER — Ambulatory Visit
Admission: RE | Admit: 2016-04-08 | Discharge: 2016-04-08 | Disposition: A | Discharge status: Home / Self Care | Payer: Medicare Other | Source: Ambulatory Visit | Attending: Radiation Oncology | Admitting: Radiation Oncology

## 2016-04-08 DIAGNOSIS — Z510 Encounter for antineoplastic radiation therapy: Secondary | ICD-10-CM | POA: Diagnosis not present

## 2016-04-08 DIAGNOSIS — C50412 Malignant neoplasm of upper-outer quadrant of left female breast: Secondary | ICD-10-CM | POA: Diagnosis not present

## 2016-04-08 DIAGNOSIS — Z171 Estrogen receptor negative status [ER-]: Secondary | ICD-10-CM | POA: Diagnosis not present

## 2016-04-08 NOTE — Telephone Encounter (Signed)
  Oncology Nurse Navigator Documentation  Navigator Location: [facility] (04/08/16 1500)   )Navigator Encounter Type: Treatment (04/08/16 1500)                     Patient Visit Type: RadOnc (04/08/16 1500) Treatment Phase: First Radiation Tx (04/08/16 1500)                            Time Spent with Patient: 15 (04/08/16 1500)

## 2016-04-09 ENCOUNTER — Ambulatory Visit
Admission: RE | Admit: 2016-04-09 | Discharge: 2016-04-09 | Disposition: A | Discharge status: Home / Self Care | Payer: Medicare Other | Source: Ambulatory Visit | Attending: Radiation Oncology | Admitting: Radiation Oncology

## 2016-04-09 DIAGNOSIS — Z510 Encounter for antineoplastic radiation therapy: Secondary | ICD-10-CM | POA: Diagnosis not present

## 2016-04-09 DIAGNOSIS — M0579 Rheumatoid arthritis with rheumatoid factor of multiple sites without organ or systems involvement: Secondary | ICD-10-CM | POA: Diagnosis not present

## 2016-04-09 DIAGNOSIS — C50412 Malignant neoplasm of upper-outer quadrant of left female breast: Secondary | ICD-10-CM | POA: Diagnosis not present

## 2016-04-09 DIAGNOSIS — M6281 Muscle weakness (generalized): Secondary | ICD-10-CM | POA: Diagnosis not present

## 2016-04-09 DIAGNOSIS — M549 Dorsalgia, unspecified: Secondary | ICD-10-CM | POA: Diagnosis not present

## 2016-04-09 DIAGNOSIS — Z79899 Other long term (current) drug therapy: Secondary | ICD-10-CM | POA: Diagnosis not present

## 2016-04-09 DIAGNOSIS — Z171 Estrogen receptor negative status [ER-]: Secondary | ICD-10-CM | POA: Diagnosis not present

## 2016-04-10 ENCOUNTER — Ambulatory Visit
Admission: RE | Admit: 2016-04-10 | Discharge: 2016-04-10 | Disposition: A | Discharge status: Home / Self Care | Payer: Medicare Other | Source: Ambulatory Visit | Attending: Radiation Oncology | Admitting: Radiation Oncology

## 2016-04-10 DIAGNOSIS — Z171 Estrogen receptor negative status [ER-]: Secondary | ICD-10-CM | POA: Diagnosis not present

## 2016-04-10 DIAGNOSIS — C50412 Malignant neoplasm of upper-outer quadrant of left female breast: Secondary | ICD-10-CM | POA: Diagnosis not present

## 2016-04-10 DIAGNOSIS — Z510 Encounter for antineoplastic radiation therapy: Secondary | ICD-10-CM | POA: Diagnosis not present

## 2016-04-12 ENCOUNTER — Ambulatory Visit
Admission: RE | Admit: 2016-04-12 | Discharge: 2016-04-12 | Disposition: A | Discharge status: Home / Self Care | Payer: Medicare Other | Source: Ambulatory Visit | Attending: Radiation Oncology | Admitting: Radiation Oncology

## 2016-04-12 DIAGNOSIS — Z171 Estrogen receptor negative status [ER-]: Secondary | ICD-10-CM | POA: Diagnosis not present

## 2016-04-12 DIAGNOSIS — Z510 Encounter for antineoplastic radiation therapy: Secondary | ICD-10-CM | POA: Diagnosis not present

## 2016-04-12 DIAGNOSIS — C50412 Malignant neoplasm of upper-outer quadrant of left female breast: Secondary | ICD-10-CM | POA: Diagnosis not present

## 2016-04-13 ENCOUNTER — Ambulatory Visit
Admission: RE | Admit: 2016-04-13 | Discharge: 2016-04-13 | Disposition: A | Discharge status: Home / Self Care | Payer: Medicare Other | Source: Ambulatory Visit | Attending: Radiation Oncology | Admitting: Radiation Oncology

## 2016-04-13 ENCOUNTER — Encounter: Payer: Self-pay | Admitting: Physical Therapy

## 2016-04-13 DIAGNOSIS — C50412 Malignant neoplasm of upper-outer quadrant of left female breast: Secondary | ICD-10-CM | POA: Diagnosis not present

## 2016-04-13 DIAGNOSIS — Z171 Estrogen receptor negative status [ER-]: Secondary | ICD-10-CM | POA: Diagnosis not present

## 2016-04-13 DIAGNOSIS — Z510 Encounter for antineoplastic radiation therapy: Secondary | ICD-10-CM | POA: Diagnosis not present

## 2016-04-14 ENCOUNTER — Ambulatory Visit
Admission: RE | Admit: 2016-04-14 | Discharge: 2016-04-14 | Disposition: A | Discharge status: Home / Self Care | Payer: Medicare Other | Source: Ambulatory Visit | Attending: Radiation Oncology | Admitting: Radiation Oncology

## 2016-04-14 ENCOUNTER — Encounter: Payer: Self-pay | Admitting: Radiation Oncology

## 2016-04-14 VITALS — BP 140/80 | HR 57 | Temp 98.2°F | Resp 18 | Ht 63.0 in | Wt 209.2 lb

## 2016-04-14 DIAGNOSIS — Z171 Estrogen receptor negative status [ER-]: Secondary | ICD-10-CM | POA: Diagnosis not present

## 2016-04-14 DIAGNOSIS — Z510 Encounter for antineoplastic radiation therapy: Secondary | ICD-10-CM | POA: Diagnosis not present

## 2016-04-14 DIAGNOSIS — C50412 Malignant neoplasm of upper-outer quadrant of left female breast: Secondary | ICD-10-CM | POA: Diagnosis not present

## 2016-04-14 NOTE — Progress Notes (Addendum)
Julie [name] has completed 11 fractions to her left breast.  She reports having pain in her lower back due to scholoisis.  She also reports that she fell on her consult day on her right knee.   Reports  Having mild fatigue.  The skin on her left breast is intact without any skin change.  Reports tenderness of the left breast.   Appetite is good.  Radiaplex gel and Alra deodorant given. Wt Readings from Last 3 Encounters:  04/14/16 209 lb 3.2 oz (94.9 kg)  04/07/16 211 lb (95.7 kg)  03/27/16 220 lb 12.8 oz (100.2 kg)  BP 140/80   Pulse (!) 57   Temp 98.2 F (36.8 C) (Oral)   Resp 18   Ht 5\' 3"  (1.6 m)   Wt 209 lb 3.2 oz (94.9 kg)   SpO2 100%   BMI 37.06 kg/m

## 2016-04-14 NOTE — Progress Notes (Signed)
  Radiation Oncology         [phone] ________________________________  Name: Julie [name] MRN: [id number]  Date: 04/14/2016  DOB: 01-03-1933  Weekly Radiation Therapy Management    ICD-9-CM ICD-10-CM   1. Malignant neoplasm of upper-outer quadrant of left breast in female, estrogen receptor negative (HCC) 174.4 C50.412    V86.1 Z17.1      Current Dose: 19.8 Gy     Planned Dose:  50.4 Gy  Narrative . . . . . . . . The patient presents for routine under treatment assessment.                                   The patient is without complaint.  The patient has completed 11 fractions to her left breast. She reported having pain in her lower back, which she attributes to scoliosis. She also reports that she [name] on her right knee on her consult day. The patient reports mild fatigue. She reports tenderness of the left breast. Her appetite is good.                                 Set-up films were reviewed.                                 The chart was checked. Physical Findings. . .  height is 5\' 3"  (1.6 m) and weight is 209 lb 3.2 oz (94.9 kg). Her oral temperature is 98.2 F (36.8 C). Her blood pressure is 140/80 and her pulse is 57 (abnormal). Her respiration is 18 and oxygen saturation is 100%. . Weight essentially stable.  Lungs are clear to auscultation bilaterally. Heart has regular rate and rhythm. No palpable cervical, supraclavicular, or axillary adenopathy. Abdomen soft, non-tender, normal bowel sounds.  Mild hyperpigmentation changes of the left breast. Impression . . . . . . . The patient is tolerating radiation. Plan . . . . . . . . . . . . Continue treatment as planned.  ________________________________   Blair Promise, PhD, MD  This document serves as a record of services personally performed by Gery Pray, MD. It was created on his behalf by Maryla Morrow, a trained medical scribe. The creation of this record is based on the scribe's personal observations and the  provider's statements to them. This document has been checked and approved by the attending provider.

## 2016-04-15 ENCOUNTER — Ambulatory Visit
Admission: RE | Admit: 2016-04-15 | Discharge: 2016-04-15 | Disposition: A | Discharge status: Home / Self Care | Payer: Medicare Other | Source: Ambulatory Visit | Attending: Radiation Oncology | Admitting: Radiation Oncology

## 2016-04-15 DIAGNOSIS — Z510 Encounter for antineoplastic radiation therapy: Secondary | ICD-10-CM | POA: Diagnosis not present

## 2016-04-15 DIAGNOSIS — C50412 Malignant neoplasm of upper-outer quadrant of left female breast: Secondary | ICD-10-CM | POA: Diagnosis not present

## 2016-04-15 DIAGNOSIS — Z171 Estrogen receptor negative status [ER-]: Secondary | ICD-10-CM | POA: Diagnosis not present

## 2016-04-17 ENCOUNTER — Ambulatory Visit: Payer: Medicare Other

## 2016-04-20 ENCOUNTER — Other Ambulatory Visit: Payer: Self-pay | Admitting: Hematology

## 2016-04-20 ENCOUNTER — Ambulatory Visit
Admission: RE | Admit: 2016-04-20 | Discharge: 2016-04-20 | Disposition: A | Discharge status: Home / Self Care | Payer: Medicare Other | Source: Ambulatory Visit | Attending: Radiation Oncology | Admitting: Radiation Oncology

## 2016-04-20 DIAGNOSIS — C50412 Malignant neoplasm of upper-outer quadrant of left female breast: Secondary | ICD-10-CM | POA: Diagnosis not present

## 2016-04-20 DIAGNOSIS — Z510 Encounter for antineoplastic radiation therapy: Secondary | ICD-10-CM | POA: Diagnosis not present

## 2016-04-20 DIAGNOSIS — Z171 Estrogen receptor negative status [ER-]: Secondary | ICD-10-CM | POA: Diagnosis not present

## 2016-04-21 ENCOUNTER — Ambulatory Visit
Admission: RE | Admit: 2016-04-21 | Discharge: 2016-04-21 | Disposition: A | Discharge status: Home / Self Care | Payer: Medicare Other | Source: Ambulatory Visit | Attending: Radiation Oncology | Admitting: Radiation Oncology

## 2016-04-21 ENCOUNTER — Encounter: Payer: Self-pay | Admitting: Radiation Oncology

## 2016-04-21 VITALS — BP 145/68 | HR 58 | Temp 97.7°F | Resp 18 | Ht 63.0 in | Wt 212.6 lb

## 2016-04-21 DIAGNOSIS — Z510 Encounter for antineoplastic radiation therapy: Secondary | ICD-10-CM | POA: Diagnosis not present

## 2016-04-21 DIAGNOSIS — C50412 Malignant neoplasm of upper-outer quadrant of left female breast: Secondary | ICD-10-CM | POA: Diagnosis not present

## 2016-04-21 DIAGNOSIS — M25552 Pain in left hip: Secondary | ICD-10-CM | POA: Insufficient documentation

## 2016-04-21 DIAGNOSIS — Z171 Estrogen receptor negative status [ER-]: Secondary | ICD-10-CM | POA: Diagnosis not present

## 2016-04-21 DIAGNOSIS — C50411 Malignant neoplasm of upper-outer quadrant of right female breast: Secondary | ICD-10-CM | POA: Insufficient documentation

## 2016-04-21 MED ORDER — RADIAPLEXRX EX GEL
Freq: Once | CUTANEOUS | Status: AC
  Administered 2016-04-21: 12:00:00 via TOPICAL

## 2016-04-21 NOTE — Progress Notes (Signed)
  Radiation Oncology         [phone] ________________________________  Name: Julie [name] MRN: [id number]  Date: 04/21/2016  DOB: 04/01/1933  Weekly Radiation Therapy Management    ICD-9-CM ICD-10-CM   1. Malignant neoplasm of upper-outer quadrant of right female breast, unspecified estrogen receptor status (HCC) 174.4 C50.411 hyaluronate sodium (RADIAPLEXRX) gel     Current Dose: 25.2 Gy     Planned Dose:  50.4 Gy  Narrative . . . . . . . . The patient presents for routine under treatment assessment.                                      The patient has completed 14 fractions to her left breast. She reports having pain in her left hip which she rates 3/10 in severity; she is taking Tramadol for this pain. She denies fatigue. The patient reports her appetite is good. She is using radiaplex gel bid as directed. The patient reports darkening of the skin in the treatment area and asks if this is normal. Additionally, the patient's daughter had some questions via phone.                                  Set-up films were reviewed.                                 The chart was checked. Physical Findings. . .  height is 5\' 3"  (1.6 m) and weight is 212 lb 9.6 oz (96.4 kg). Her oral temperature is 97.7 F (36.5 C). Her blood pressure is 145/68 (abnormal) and her pulse is 58 (abnormal). Her respiration is 18 and oxygen saturation is 100%. . Weight essentially stable.  Lungs are clear to auscultation bilaterally. Heart has regular rate and rhythm. No palpable cervical, supraclavicular, or axillary adenopathy. Abdomen soft, non-tender, normal bowel sounds.  Mild hyperpigmentation changes of the left breast. Impression . . . . . . . The patient is tolerating radiation. Plan . . . . . . . . . . . . Continue treatment as planned. I discussed her skin changes with the patient, and reassured her that nothing looks out of the ordinary for this stage of  treatment.  ________________________________   Blair Promise, PhD, MD  This document serves as a record of services personally performed by Gery Pray, MD. It was created on his behalf by Maryla Morrow, a trained medical scribe. The creation of this record is based on the scribe's personal observations and the provider's statements to them. This document has been checked and approved by the attending provider.

## 2016-04-21 NOTE — Addendum Note (Signed)
Encounter addended by: Malena Edman, RN on: 04/21/2016 11:32 AM<BR>    Actions taken: MAR administration accepted

## 2016-04-21 NOTE — Progress Notes (Signed)
Alyzon Stapel has completed 14 fractions to her left breast. She reports having pain in her left hip 3/10 taking Tramadol.  Denies fatigue. The skin on her left breast mild hyperpigmentation.  Appetite is good.  Radiaplex gel bid. Wt Readings from Last 3 Encounters:  04/21/16 212 lb 9.6 oz (96.4 kg)  04/14/16 209 lb 3.2 oz (94.9 kg)  04/07/16 211 lb (95.7 kg)  BP (!) 145/68   Pulse (!) 58   Temp 97.7 F (36.5 C) (Oral)   Resp 18   Ht 5\' 3"  (1.6 m)   Wt 212 lb 9.6 oz (96.4 kg)   SpO2 100%   BMI 37.66 kg/m

## 2016-04-22 ENCOUNTER — Ambulatory Visit
Admission: RE | Admit: 2016-04-22 | Discharge: 2016-04-22 | Disposition: A | Discharge status: Home / Self Care | Payer: Medicare Other | Source: Ambulatory Visit | Attending: Radiation Oncology | Admitting: Radiation Oncology

## 2016-04-22 DIAGNOSIS — Z171 Estrogen receptor negative status [ER-]: Secondary | ICD-10-CM | POA: Diagnosis not present

## 2016-04-22 DIAGNOSIS — Z510 Encounter for antineoplastic radiation therapy: Secondary | ICD-10-CM | POA: Diagnosis not present

## 2016-04-22 DIAGNOSIS — C50412 Malignant neoplasm of upper-outer quadrant of left female breast: Secondary | ICD-10-CM | POA: Diagnosis not present

## 2016-04-23 ENCOUNTER — Other Ambulatory Visit: Payer: Self-pay | Admitting: Hematology

## 2016-04-23 ENCOUNTER — Ambulatory Visit
Admission: RE | Admit: 2016-04-23 | Discharge: 2016-04-23 | Disposition: A | Discharge status: Home / Self Care | Payer: Medicare Other | Source: Ambulatory Visit | Attending: Radiation Oncology | Admitting: Radiation Oncology

## 2016-04-23 ENCOUNTER — Ambulatory Visit: Payer: Self-pay | Admitting: Hematology

## 2016-04-23 ENCOUNTER — Ambulatory Visit: Payer: Medicare Other

## 2016-04-23 ENCOUNTER — Other Ambulatory Visit: Payer: Self-pay

## 2016-04-23 DIAGNOSIS — C50411 Malignant neoplasm of upper-outer quadrant of right female breast: Secondary | ICD-10-CM

## 2016-04-23 DIAGNOSIS — Z170 Estrogen receptor positive status [ER+]: Principal | ICD-10-CM

## 2016-04-23 DIAGNOSIS — Z171 Estrogen receptor negative status [ER-]: Principal | ICD-10-CM

## 2016-04-23 DIAGNOSIS — C50412 Malignant neoplasm of upper-outer quadrant of left female breast: Secondary | ICD-10-CM | POA: Diagnosis not present

## 2016-04-23 DIAGNOSIS — Z510 Encounter for antineoplastic radiation therapy: Secondary | ICD-10-CM | POA: Diagnosis not present

## 2016-04-23 LAB — GLUCOSE, CAPILLARY
GLUCOSE-CAPILLARY: 54 mg/dL — AB (ref 65–99)
GLUCOSE-CAPILLARY: 60 mg/dL — AB (ref 65–99)
Glucose-Capillary: 93 mg/dL (ref 65–99)

## 2016-04-23 NOTE — Progress Notes (Signed)
  Radiation Oncology         [phone] ________________________________  Name: Julie [name] MRN: [id number]  Date: 04/23/2016  DOB: [birth year]  Weekly Radiation Therapy Management  No diagnosis found.   Current Dose: 27 Gy     Planned Dose:  50.4 Gy  Narrative . . . . . . . . The patient presents for routine under treatment assessment.                                      The patient presented to the clinic feeling dizzy. Blood sugar was 54. Patient was given a coke and several small candy bars. The patient is now talking normally and reports feeling better. Blood sugar was rechecked and is now 60.                                   Set-up films were reviewed.                                 The chart was checked. Physical Findings. . .  Vitals with BMI 04/23/2016  Height   Weight   BMI   Systolic [id number]  Diastolic 72  Pulse 72  Respirations    Weight essentially stable.  Lungs are clear to auscultation bilaterally. Heart has regular rate and rhythm.   Impression . . . . . . . The patient is tolerating radiation. The patient is feeling better after eating some food.  Plan . . . . . . . . . . . . Continue treatment as planned.  ________________________________   Blair Promise, PhD, MD  This document serves as a record of services personally performed by Gery Pray, MD. It was created on his behalf by Bethann Humble, a trained medical scribe. The creation of this record is based on the scribe's personal observations and the provider's statements to them. This document has been checked and approved by the attending provider.

## 2016-04-23 NOTE — Progress Notes (Signed)
Patient had eaten ham from the sandwich given earlier, rechecked her blood glucose on finger, result=93, Patient states"that;'s good for me, paged MD of result 12:46 PM

## 2016-04-23 NOTE — Progress Notes (Deleted)
[place]  [phone] [phone]  Clinic follow up Note   Patient Care Team: [name], MD as PCP - General (Internal Medicine) Valinda Party, MD as Referring Physician (Rheumatology) 04/23/2016  CHIEF COMPLAINTS:  Follow up left breast cancer   Oncology History   Malignant neoplasm of upper outer quadrant of female breast [facility])   Staging form: Breast, AJCC 7th Edition     Pathologic stage from 11/14/2015: Stage IA (T1c, N0, cM0) - Signed by Truitt Merle, MD on 11/25/2015     Clinical stage from 11/20/2015: Stage IA (T1c, N0, M0) - Signed by Curt Bears, MD on 11/20/2015       Malignant neoplasm of upper outer quadrant of female breast ([place])   10/09/2015 Mammogram    Screening bilateral mammograms showed a possible left breast mass. Diagnostic mammogram and ultrasound showed a 7 x 7 x 7 mm mass in the left breast at 11:00 position.      10/22/2015 Receptors her2    ER negative, PR negative, HER-2 not amplified      10/22/2015 Initial Biopsy    Left breast needle core biopsy at the 1:00 position showed invasive ductal carcinoma.      10/22/2015 Initial Diagnosis    Malignant neoplasm of upper outer quadrant of female breast ([place])      11/14/2015 Surgery    Left breast lumpectomy and sentinel lymph node biopsy      11/14/2015 Pathology Results    Left breast lumpectomy showed invasive ductal carcinoma, grade 3, 1.1 cm, resection margins were negative, sentinel lymph node biopsy showed benign breast parenchyma, lymph node tissue not identified. Repeated ER PR and HER-2 were all negative.      12/20/2015 -  Chemotherapy    Weekly Taxol 80 mg/m, changed to Abraxane 100 mg/m from week 3 due to infusion reaction, and dose reduced from week 9 due to neuropathy.       HISTORY OF PRESENTING ILLNESS:  Julie [name] 82 y.o. female is here because of her recently diagnosed left breast cancer. She has been seen my partner Dr. Julien Nordmann for MGUS,  and was referred to me to discuss her adjuvant therapy for breast cancer. She is accompanied by her daughter to the clinic today.  Her breast cancer was discovered by screening mammogram. She has no palpable breast mass, skin change or nipple discharge. She denies any new constitutional symptoms. The diagnostic mammogram and ultrasound showed a 7 mm mass in the left breast, biopsy showed invasive ductal carcinoma, triple negative. She was seen by breast surgeon Dr. [name], and underwent left rest lumpectomy and sentinel lymph node biopsy on 11/14/2015.   She had a multiple orthopedic surgeries in the past, takes tramadol as needed. She lives with her daughter, pretty independent, does not exercise regularly, but it goes out for shopping, playing cards, etc.  GYN HISTORY  Menarchal: 16 LMP: 62 (hysrectomy)  Contraceptive: 2 HRT: 1 yr  G5P5: she did breast feeding to 2 children   CURRENT THERAPY: weekly Taxol 43m/m2, started on 12/20/2015, changed to Abraxane 1068mm2 weekly from week 3 due to infusion reaction, and dose reduction from week 9 due to neuropathy  INTERIM HISTORY: NoElyzaeturns for follow-up. States that her butt is hurting on the left side. This pain is getting worse with a burning sensation and beginning to radiate towards her anterior groin. It feels as though there is pressure squeezing down on her back and rates it a constant 8/10 on  pain scale. She is not taking medication to relieve this and has not had any imaging studies since her fall. This pain does not radiate past her knee. Her low back does not hurt with palpation. She denies current hip or back problems. Years prior she did have back problems but has not since receiving acupuncture about 5 years ago. She has been using a cane for years for balance. She showed a picture of bruising on her left leg after a previous fall - this bruising has resolved. She had her port removed two days ago. She is unable to wear closed toe  shoes due to toe nail sensitivity. She also reports shooting pain in her breast since surgery.   MEDICAL HISTORY:  Past Medical History:  Diagnosis Date  . Anemia   . Anxiety    takes Klonopin at bedtime  . Arthritis    takes Methotrexate weekly and Prednisone  . Blood transfusion    no abnormal reaction noted  . Cataracts, bilateral    immature  . Complication of anesthesia    wakes up during surgery  . Depression   . Diabetes mellitus without complication ([place])   . Dyspnea    with exertion since chemo   . Fibromyalgia    takes Lyrica daily  . GERD (gastroesophageal reflux disease)    takes Nexium daily  . Glaucoma   . Hard of hearing   . Heart murmur   . Histoplasmosis   . History of colon polyps   . History of gout   . History of hiatal hernia   . History of shingles   . Hyperlipidemia   . Hypertension    takes Amlodipine,Metoprolol,and Losartan daily  . Incontinence of bowel   . Interstitial cystitis   . Joint pain   . Nocturia   . Peripheral neuropathy ([place])   . PONV (postoperative nausea and vomiting)   . Ulcer ([place]) 1970   duodenal  . Urinary frequency   . Urinary urgency   . Weakness    numbness in both hands and feet    SURGICAL HISTORY: Past Surgical History:  Procedure Laterality Date  . ABDOMINAL HYSTERECTOMY    . accupuncture  3 yrs ago  . ANKLE FUSION Left 05/10/2014   Procedure: LEFT ANKLE ARTHRODESIS ;  Surgeon: Wylene Simmer, MD;  Location: [facility];  Service: Orthopedics;  Laterality: Left;  . ANKLE SURGERY Left   . arm surgery Left    radius  . bladder tacked     . CERVICAL FUSION    . CHOLECYSTECTOMY    . COLONOSCOPY    . CYSTOCELE REPAIR    . DILATION AND CURETTAGE OF UTERUS    . ESOPHAGOGASTRODUODENOSCOPY    . PORT A CATH REVISION N/A 12/27/2015   Procedure: PORT A CATH REVISION;  Surgeon: Rolm Bookbinder, MD;  Location: [facility];  Service: General;  Laterality: N/A;  . PORT-A-CATH REMOVAL N/A 03/25/2016    Procedure: REMOVAL PORT-A-CATH;  Surgeon: Rolm Bookbinder, MD;  Location: [facility];  Service: General;  Laterality: N/A;  . PORTACATH PLACEMENT Right 12/27/2015   Procedure: INSERTION PORT-A-CATH WITH ULTRASOUND ;  Surgeon: Rolm Bookbinder, MD;  Location: [facility];  Service: General;  Laterality: Right;  . RADIOACTIVE SEED GUIDED MASTECTOMY WITH AXILLARY SENTINEL LYMPH NODE BIOPSY Left 11/14/2015   Procedure: RADIOACTIVE SEED GUIDED LEFT BREAST LUMPECTOMY WITH AXILLARY SENTINEL LYMPH NODE BIOPSY;  Surgeon: Rolm Bookbinder, MD;  Location: [facility];  Service: General;  Laterality: Left;  RADIOACTIVE SEED GUIDED LEFT BREAST LUMPECTOMY WITH AXILLARY SENTINEL LYMPH NODE BIOPSY  . SALPINGOOPHORECTOMY Bilateral   . TOTAL KNEE ARTHROPLASTY Right 02/28/2015   Procedure: RIGHT TOTAL KNEE ARTHROPLASTY;  Surgeon: Rod Can, MD;  Location: [facility];  Service: Orthopedics;  Laterality: Right;  . UPPER GASTROINTESTINAL ENDOSCOPY    . VAGINAL HYSTERECTOMY      SOCIAL HISTORY: Social History   Social History  . Marital status: Divorced    Spouse name: N/A  . Number of children: 5  . Years of education: N/A   Occupational History  . Not on file.   Social History Main Topics  . Smoking status: Never Smoker  . Smokeless tobacco: Never Used  . Alcohol use No     Comment: IN [place] ONLY  . Drug use: No  . Sexual activity: Not on file   Other Topics Concern  . Not on file   Social History Narrative  . No narrative on file    FAMILY HISTORY: Family History  Problem Relation Age of Onset  . Colon cancer Sister 80  . Esophageal cancer Brother 7  . Breast cancer Mother   . Brain cancer Other     ALLERGIES:  is allergic to codeine; other; and aspirin.  MEDICATIONS:  Current Outpatient Prescriptions  Medication Sig Dispense Refill  . acetaminophen (TYLENOL) 500 MG tablet Take 500 mg by mouth every 6 (six) hours as needed (for pain/fever.).     [name]  amLODipine (NORVASC) 10 MG tablet Take 10 mg by mouth every morning.     . Biotin 10000 MCG TABS Take 1 tablet by mouth daily.    . calcium-vitamin D (OSCAL WITH D) 500-200 MG-UNIT per tablet Take 1 tablet by mouth every morning.    . clonazePAM (KLONOPIN) 0.5 MG tablet Take 0.5 mg by mouth at bedtime.    [name] ezetimibe (ZETIA) 10 MG tablet Take 10 mg by mouth daily.     . fish oil-omega-3 fatty acids 1000 MG capsule Take 1 g by mouth every morning.     . folic acid (FOLVITE) 1 MG tablet Take 2 mg by mouth every morning.     . gabapentin (NEURONTIN) 100 MG capsule TAKE ONE CAPSULE 3 TIMES A DAY 90 capsule 0  . hyaluronate sodium (RADIAPLEXRX) GEL Apply 1 application topically 2 (two) times daily.    . hydroxypropyl methylcellulose / hypromellose (ISOPTO TEARS / GONIOVISC) 2.5 % ophthalmic solution Place 1 drop into both eyes daily as needed for dry eyes.    . meloxicam (MOBIC) 15 MG tablet Take 15 mg by mouth daily.  11  . metFORMIN (GLUCOPHAGE) 500 MG tablet Take 500 mg by mouth daily.     . metoprolol succinate (TOPROL-XL) 50 MG 24 hr tablet TAKE 1 TABLET AT BEDTIME ONCE A DAY ORALLY 90 DAYS  2  . non-metallic deodorant (ALRA) MISC Apply 1 application topically daily as needed.    . ondansetron (ZOFRAN) 8 MG tablet Take 1 tablet (8 mg total) by mouth 2 (two) times daily as needed (Nausea or vomiting). 30 tablet 1  . pantoprazole (PROTONIX) 40 MG tablet TAKE 1 TABLET (40 MG TOTAL) BY MOUTH DAILY. 30 tablet 0  . pregabalin (LYRICA) 75 MG capsule Take 75 mg by mouth at bedtime.     . prochlorperazine (COMPAZINE) 10 MG tablet Take 1 tablet (10 mg total) by mouth every 6 (six) hours as needed (Nausea or vomiting). 30 tablet 1  . traMADol (ULTRAM) 50 MG tablet  TAKE 1 TABLET BY MOUTH EVERY 6-8 HOURS AS NEEDED X 30 DAYS  0  . valsartan-hydrochlorothiazide (DIOVAN-HCT) 320-12.5 MG per tablet Take 1 tablet by mouth daily.    . vitamin B-12 (CYANOCOBALAMIN) 1000 MCG tablet Take 1,000 mcg by mouth daily.      . vitamin E 400 UNIT capsule Take 400 Units by mouth daily.     No current facility-administered medications for this visit.     REVIEW OF SYSTEMS:   Constitutional: Denies fevers, chills or abnormal night sweats Eyes: Denies blurriness of vision, double vision or watery eyes Ears, nose, mouth, throat, and face: Denies mucositis or sore throat Respiratory: Denies cough, dyspnea or wheezes Cardiovascular: Denies palpitation, chest discomfort or lower extremity swelling Gastrointestinal:  Denies nausea, heartburn or change in bowel habits Skin: Denies abnormal skin rashes Musculoskeletal: (+) back pain, left hip pain Lymphatics: Denies new lymphadenopathy or easy bruising Neurological:Denies numbness, tingling or new weaknesses Behavioral/Psych: Mood is stable, no new changes  All other systems were reviewed with the patient and are negative.   PHYSICAL EXAMINATION: ECOG PERFORMANCE STATUS: 1 - Symptomatic but completely ambulatory There were no vitals filed for this visit. GENERAL:alert, no distress and comfortable SKIN: skin color, texture, turgor are normal, no rashes or significant lesions. Her right 3rd toe nail is loose, no significant other nail changes. There is a boil above her viginal area  EYES: normal, conjunctiva are pink and non-injected, sclera clear OROPHARYNX:no exudate, no erythema and lips, buccal mucosa, and tongue normal  NECK: supple, thyroid normal size, non-tender, without nodularity LYMPH:  no palpable lymphadenopathy in the cervical, axillary or inguinal LUNGS: clear to auscultation and percussion with normal breathing effort HEART: regular rate & rhythm and no murmurs and no lower extremity edema ABDOMEN:abdomen soft, non-tender and normal bowel sounds Musculoskeletal:no cyanosis of digits and no clubbing. No tenderness with palpation of lower back or left hip PSYCH: alert & oriented x 3 with fluent speech NEURO: no focal motor/sensory deficits, except mild  decreased vibration sensation on her hands    LABORATORY DATA:  I have reviewed the data as listed CBC Latest Ref Rng & Units 03/27/2016 03/13/2016 03/06/2016  WBC 3.9 - 10.3 10e3/uL 7.2 3.1(L) 3.4(L)  Hemoglobin 11.6 - 15.9 g/dL 10.2(L) 10.0(L) 9.3(L)  Hematocrit 34.8 - 46.6 % 32.2(L) 30.9(L) 29.1(L)  Platelets 145 - 400 10e3/uL 252 269 260   CMP Latest Ref Rng & Units 03/27/2016 03/13/2016 03/06/2016  Glucose 70 - 140 mg/dl 86 78 90  BUN 7.0 - 26.0 mg/dL 31.9(H) 30.1(H) 27.4(H)  Creatinine 0.6 - 1.1 mg/dL 1.0 1.1 0.9  Sodium 136 - 145 mEq/L 145 142 142  Potassium 3.5 - 5.1 mEq/L 4.7 3.9 3.8  Chloride 101 - 111 mmol/L - - -  CO2 22 - 29 mEq/L _0 Calcium 8.4 - 10.4 mg/dL 9.6 9.4 9.3  Total Protein 6.4 - 8.3 g/dL 6.6 6.8 6.6  Total Bilirubin 0.20 - 1.20 mg/dL 0.58 0.55 0.61  Alkaline Phos 40 - 150 U/L 66 67 68  AST 5 - 34 U/L _1 ALT 0 - 55 U/L 11 9 <6   M-protein: 02/10/2012: 0.74 12/20/2015: 0.9 03/06/2016: 0.4  IgA 02/10/2012: 822 12/20/2015: 783  Kappa, lambda light chains (mg/L) and ratio  02/10/2012: 41.6, 21.9, 1.90 12/20/2015: 40.3, 12.8, 3.15  03/06/2016: 46.9, 14.3, 3.28  PATHOLOGY REPORT  Diagnosis 11/14/2015 1. Breast, lumpectomy, Left - INVASIVE DUCTAL CARCINOMA, GRADE III/III, SPANNING 1.1 CM. - THE RESECTION MARGINS ARE NEGATIVE  FOR CARCINOMA. - SEE ONCOLOGY TABLE BELOW. 2. Lymph node, sentinel, biopsy, Left Axillary - BENIGN BREAST PARENCHYMA. - LYMPH NODAL TISSUE IS NOT IDENTIFIED. - THERE IS NO EVIDENCE OF MALIGNANCY. Microscopic Comment 1. BREAST, INVASIVE TUMOR, WITH LYMPH NODES PRESENT Specimen, including laterality and lymph node sampling (sentinel, non-sentinel): Left breast Procedure: Seed localized lumpectomy Histologic type: Ductal Grade: III Tubule formation: 2 Nuclear pleomorphism: 3 Mitotic: 3 Tumor size (gross measurement): 1.1 cm Margins: Greater than 0.2 cm to all margins Lymphovascular invasion: Not identified Ductal  carcinoma in situ: Not identified. Lobular neoplasia: Not identified. Tumor focality: Unifocal Treatment effect: N/A Extent of tumor: Confined to breast parenchyma Lymph nodes: None examined. Breast prognostic profile: Case [id number] Estrogen receptor: 0% Progesterone receptor: 0% Her 2 neu: No amplification was detected. Ki-67: 80% A breast prognostic profile will be repeated on the case and the results reported separately. Non-neoplastic breast: No significant findings. TNM: pT1c, pNX (JBK:kh 11-15-15) 2 of 4 FINAL for Soffer, Bernita L ([id number]) Enid Cutter MD Pathologist, Electronic  Results: IMMUNOHISTOCHEMICAL AND MORPHOMETRIC ANALYSIS PERFORMED MANUALLY Estrogen Receptor: 0%, NEGATIVE Progesterone Receptor: 0%, NEGATIVE  Results: HER2 - NEGATIVE RATIO OF HER2/CEP17 SIGNALS 1.32 AVERAGE HER2 COPY NUMBER PER CELL 3.10   RADIOGRAPHIC STUDIES: I have personally reviewed the radiological images as listed and agreed with the findings in the report. Dg Lumbar Spine Complete  Result Date: 03/27/2016 CLINICAL DATA:  Low back pain for 3 weeks, no known injury, initial encounter EXAM: LUMBAR SPINE - COMPLETE 4+ VIEW COMPARISON:  03/12/2016 FINDINGS: Five lumbar type vertebral bodies are again well visualized. Vertebral body height is well maintained. Multilevel facet hypertrophic changes are noted. Mild anterolisthesis of L4-5 of a degenerative nature is again identified. Disc space narrowing is noted from L2-S1. No significant soft tissue abnormality is noted aside from aortic calcifications. IMPRESSION: Multilevel degenerative change stable from the prior exam. No acute abnormality noted. Electronically Signed   By: Inez Catalina M.D.   On: 03/27/2016 13:40   Dg Hip Unilat With Pelvis 2-3 Views Left  Result Date: 03/27/2016 CLINICAL DATA:  Left hip pain for several weeks, no known injury, initial encounter EXAM: DG HIP (WITH OR WITHOUT PELVIS) 2-3V LEFT COMPARISON:  None.  FINDINGS: The pelvic ring is intact. Mild degenerative changes of the hip joints are noted. Degenerative change of the pubic symphysis is seen. No acute fracture or dislocation is noted. No soft tissue abnormality is seen. IMPRESSION: No acute abnormality noted. Electronically Signed   By: Inez Catalina M.D.   On: 03/27/2016 13:40    ASSESSMENT & PLAN:  82 year old female, with past medical history of MGUS, hypertension, arthritis, with good performance status, was found to have stage I triple negative left breast cancer by screening mammogram.  1. Malignant neoplasm of upper outer quadrant of left female breast, invasive ductal carcinoma, grade 3, pT1cN0M0, stage IA, ER-/PR-/HER2- --I discussed her breast mammogram, ultrasound, biopsy and final surgical path result in details -She had sentinel lymph node biopsy, however it showed normal breast parenchyma, no lymphoid tissue, her pathological node status is unknown, no ultrasound evidence of adenopathy. -We reviewed the aggressive nature of triple negative breast cancer. Giving her stage I disease, I think she probably has 20-30% risk of distant recurrence in the next 5-10 years.  -I recommend weekly Taxol for 12 weeks as adjuvant chemotherapy to reduce her risk of cancer recurrence. -She has been tolerating chemo well, Taxol was changed to Abraxane from week 3 due to infusion reaction. . -She is  tolerating chemotherapy well, except neuropathy. I'll further decrease her Abraxane to 77m/m2 for the last two weeks treatments  2. MGUS -will continue follow up SPEP/IFE, and light chain levels every 3 months  3. Anemia  -she has chronic anemia, worse last year due to her surgery  -Overall stable, we'll continue monitoring  4. Joint and muscular pain, Rheumatoid arthritis -She has constant low back and left hip pain, possible related to Taxol and RA (she is off MTX and prednisone now)  -I prescribed Tylenol #3 today -While undergoing XRT, she was  discontinued from methotrexate previously prescribed by her rheumatologist Dr. SDossie Der After XRT is completed, we will discuss restarting this medication. -I will send Dr. SDossie Dera copy of our note today. I encourage pt to follow up with Dr. SDossie Der -I encouraged her to take the calcium and magnesium over-the-counter, drink fluids adequately.  -We'll continue monitoring   5. Mild peripheral neuropathy, G1 -No impact on her function, we'll continue monitor closely -We'll decrease her Abraxane dose for the last 2 treatments -She has mild gait instability due to neuropathy and fatigue, I recommend her to get physical therapy, she is interested.  Plan -She will continue with XRT with Dr. KSondra Come if he agrees, pt can restart prednisone 583mdaily  -I will send a copy of our note today to her rheumatologist Dr. SyDossie DerI have ordered X-ray of lower back and left hip for later today -I called in Tylenol #3 to her pharmacy -I'll see her back on 11/30 after she has completed XRT. The current follow up appointment scheduled on 11/10 will be cancelled.   All questions were answered. The patient knows to call the clinic with any problems, questions or concerns.  I spent 20 minutes counseling the patient face to face. The total time spent in the appointment was 25 minutes and more than 50% was on counseling.  This document serves as a record of services personally performed by YaTruitt MerleMD. It was created on her behalf by ElArlyce Harmana trained medical scribe. The creation of this record is based on the scribe's personal observations and the provider's statements to them. This document has been checked and approved by the attending provider.     FeTruitt MerleMD 04/23/2016   Addendum I called pt and informed her x-ray results from yesterday. No fracture, some degenerative changes in lumbar spine.  She voiced good understanding.  FeTruitt Merle

## 2016-04-23 NOTE — Progress Notes (Addendum)
Patient reports feeling dizzy and thinks that her blood sugar is low.  Patient is lethargic and slow to answer questions.  Blood sugar was 54.  Gave patient a coke and several small candy bars.  She is now talking normaly and says she feels better.  Blood sugar rechecked and is now 60.  Encouraged patient to eat a sandwich.  Will continue to monitor.  BP (!) 147/72 (BP Location: Right Arm, Patient Position: Sitting)   Pulse 72

## 2016-04-24 ENCOUNTER — Telehealth: Payer: Self-pay | Admitting: Hematology

## 2016-04-24 ENCOUNTER — Ambulatory Visit
Admission: RE | Admit: 2016-04-24 | Discharge: 2016-04-24 | Disposition: A | Discharge status: Home / Self Care | Payer: Medicare Other | Source: Ambulatory Visit | Attending: Radiation Oncology | Admitting: Radiation Oncology

## 2016-04-24 DIAGNOSIS — C50412 Malignant neoplasm of upper-outer quadrant of left female breast: Secondary | ICD-10-CM | POA: Diagnosis not present

## 2016-04-24 DIAGNOSIS — Z171 Estrogen receptor negative status [ER-]: Secondary | ICD-10-CM | POA: Diagnosis not present

## 2016-04-24 DIAGNOSIS — Z510 Encounter for antineoplastic radiation therapy: Secondary | ICD-10-CM | POA: Diagnosis not present

## 2016-04-24 NOTE — Telephone Encounter (Signed)
Appointments scheduled per 12/1 LOS. Patient given AVS report and calendars with future scheduled appointments. °

## 2016-04-27 ENCOUNTER — Ambulatory Visit
Admission: RE | Admit: 2016-04-27 | Discharge: 2016-04-27 | Disposition: A | Discharge status: Home / Self Care | Payer: Medicare Other | Source: Ambulatory Visit | Attending: Radiation Oncology | Admitting: Radiation Oncology

## 2016-04-27 DIAGNOSIS — H2511 Age-related nuclear cataract, right eye: Secondary | ICD-10-CM | POA: Diagnosis not present

## 2016-04-27 DIAGNOSIS — Z171 Estrogen receptor negative status [ER-]: Secondary | ICD-10-CM | POA: Diagnosis not present

## 2016-04-27 DIAGNOSIS — H25013 Cortical age-related cataract, bilateral: Secondary | ICD-10-CM | POA: Diagnosis not present

## 2016-04-27 DIAGNOSIS — Z510 Encounter for antineoplastic radiation therapy: Secondary | ICD-10-CM | POA: Diagnosis not present

## 2016-04-27 DIAGNOSIS — H2513 Age-related nuclear cataract, bilateral: Secondary | ICD-10-CM | POA: Diagnosis not present

## 2016-04-27 DIAGNOSIS — C50412 Malignant neoplasm of upper-outer quadrant of left female breast: Secondary | ICD-10-CM | POA: Diagnosis not present

## 2016-04-28 ENCOUNTER — Encounter: Payer: Self-pay | Admitting: Radiation Oncology

## 2016-04-28 ENCOUNTER — Ambulatory Visit
Admission: RE | Admit: 2016-04-28 | Discharge: 2016-04-28 | Disposition: A | Discharge status: Home / Self Care | Payer: Medicare Other | Source: Ambulatory Visit | Attending: Radiation Oncology | Admitting: Radiation Oncology

## 2016-04-28 VITALS — BP 145/69 | HR 54 | Temp 97.5°F | Resp 20 | Wt 211.0 lb

## 2016-04-28 DIAGNOSIS — Z510 Encounter for antineoplastic radiation therapy: Secondary | ICD-10-CM | POA: Diagnosis not present

## 2016-04-28 DIAGNOSIS — C50412 Malignant neoplasm of upper-outer quadrant of left female breast: Secondary | ICD-10-CM | POA: Diagnosis not present

## 2016-04-28 DIAGNOSIS — Z171 Estrogen receptor negative status [ER-]: Principal | ICD-10-CM

## 2016-04-28 DIAGNOSIS — C50411 Malignant neoplasm of upper-outer quadrant of right female breast: Secondary | ICD-10-CM

## 2016-04-28 NOTE — Progress Notes (Signed)
  Radiation Oncology         [phone] ________________________________  Name: Julie [name] MRN: [id number]  Date: 04/28/2016  DOB: 1932-11-23  Weekly Radiation Therapy Management  No diagnosis found.   Current Dose: 34.2 Gy     Planned Dose:  50.4 Gy  Narrative . . . . . . . . The patient presents for routine under treatment assessment.                                      The patient has completed 19 treatments to the left breast. She denies pain in her breast, reporting some neuropathy in her hands and feet. She reports a good appetite.                                  Set-up films were reviewed.                                 The chart was checked. Physical Findings. . .  Vitals:   04/28/16 1431  BP: (!) 145/69  Pulse: (!) 54  Resp: 20  Temp: 97.5 F (36.4 C)  Weight essentially stable.  Lungs are clear to auscultation bilaterally. Heart has regular rate and rhythm. Mild hyperpigmentation changes in the left breast. Impression . . . . . . . The patient is tolerating radiation. The patient is in a wheelchair today. Plan . . . . . . . . . . . . Continue treatment as planned.  ________________________________   Blair Promise, PhD, MD  This document serves as a record of services personally performed by Gery Pray, MD. It was created on his behalf by Maryla Morrow, a trained medical scribe. The creation of this record is based on the scribe's personal observations and the provider's statements to them. This document has been checked and approved by the attending provider.

## 2016-04-28 NOTE — Progress Notes (Addendum)
Weekly rad txs 19 completed to left breast, mild tanning, skin intact, uses radiaplex bid, appetite good,  In w/c, no c/o pain in breast, just c/o neuropathy in hands and feet 2:37 PM BP (!) 145/69 (BP Location: Right Arm, Patient Position: Sitting, Cuff Size: Normal)   Pulse (!) 54   Temp 97.5 F (36.4 C) (Oral)   Resp 20   Wt 211 lb (95.7 kg)   BMI 37.38 kg/m   Wt Readings from Last 3 Encounters:  04/28/16 211 lb (95.7 kg)  04/21/16 212 lb 9.6 oz (96.4 kg)  04/14/16 209 lb 3.2 oz (94.9 kg)

## 2016-04-29 ENCOUNTER — Ambulatory Visit
Admission: RE | Admit: 2016-04-29 | Discharge: 2016-04-29 | Disposition: A | Discharge status: Home / Self Care | Payer: Medicare Other | Source: Ambulatory Visit | Attending: Radiation Oncology | Admitting: Radiation Oncology

## 2016-04-29 DIAGNOSIS — Z510 Encounter for antineoplastic radiation therapy: Secondary | ICD-10-CM | POA: Diagnosis not present

## 2016-04-29 DIAGNOSIS — Z171 Estrogen receptor negative status [ER-]: Secondary | ICD-10-CM | POA: Diagnosis not present

## 2016-04-29 DIAGNOSIS — C50412 Malignant neoplasm of upper-outer quadrant of left female breast: Secondary | ICD-10-CM | POA: Diagnosis not present

## 2016-04-30 ENCOUNTER — Ambulatory Visit
Admission: RE | Admit: 2016-04-30 | Discharge: 2016-04-30 | Disposition: A | Discharge status: Home / Self Care | Payer: Medicare Other | Source: Ambulatory Visit | Attending: Radiation Oncology | Admitting: Radiation Oncology

## 2016-04-30 DIAGNOSIS — C50412 Malignant neoplasm of upper-outer quadrant of left female breast: Secondary | ICD-10-CM | POA: Diagnosis not present

## 2016-04-30 DIAGNOSIS — Z171 Estrogen receptor negative status [ER-]: Secondary | ICD-10-CM | POA: Diagnosis not present

## 2016-04-30 DIAGNOSIS — Z510 Encounter for antineoplastic radiation therapy: Secondary | ICD-10-CM | POA: Diagnosis not present

## 2016-05-01 ENCOUNTER — Ambulatory Visit
Admission: RE | Admit: 2016-05-01 | Discharge: 2016-05-01 | Disposition: A | Discharge status: Home / Self Care | Payer: Medicare Other | Source: Ambulatory Visit | Attending: Radiation Oncology | Admitting: Radiation Oncology

## 2016-05-01 DIAGNOSIS — Z510 Encounter for antineoplastic radiation therapy: Secondary | ICD-10-CM | POA: Diagnosis not present

## 2016-05-01 DIAGNOSIS — Z171 Estrogen receptor negative status [ER-]: Secondary | ICD-10-CM | POA: Diagnosis not present

## 2016-05-01 DIAGNOSIS — C50412 Malignant neoplasm of upper-outer quadrant of left female breast: Secondary | ICD-10-CM | POA: Diagnosis not present

## 2016-05-04 ENCOUNTER — Ambulatory Visit
Admission: RE | Admit: 2016-05-04 | Discharge: 2016-05-04 | Disposition: A | Discharge status: Home / Self Care | Payer: Medicare Other | Source: Ambulatory Visit | Attending: Radiation Oncology | Admitting: Radiation Oncology

## 2016-05-04 DIAGNOSIS — Z510 Encounter for antineoplastic radiation therapy: Secondary | ICD-10-CM | POA: Diagnosis not present

## 2016-05-04 DIAGNOSIS — C50412 Malignant neoplasm of upper-outer quadrant of left female breast: Secondary | ICD-10-CM | POA: Diagnosis not present

## 2016-05-04 DIAGNOSIS — Z171 Estrogen receptor negative status [ER-]: Secondary | ICD-10-CM | POA: Diagnosis not present

## 2016-05-04 NOTE — Progress Notes (Signed)
[place]  Telephone:(336) [phone] Fax:(336) [phone]  Clinic follow up Note   Patient Care Team: [name], MD as PCP - General (Internal Medicine) Valinda Party, MD as Referring Physician (Rheumatology) 05/05/2016  CHIEF COMPLAINTS:  Follow up left breast cancer   Oncology History   Malignant neoplasm of upper outer quadrant of female breast [facility])   Staging form: Breast, AJCC 7th Edition     Pathologic stage from 11/14/2015: Stage IA (T1c, N0, cM0) - Signed by Truitt Merle, MD on 11/25/2015     Clinical stage from 11/20/2015: Stage IA (T1c, N0, M0) - Signed by Curt Bears, MD on 11/20/2015       Malignant neoplasm of upper outer quadrant of female breast ([place])   10/09/2015 Mammogram    Screening bilateral mammograms showed a possible left breast mass. Diagnostic mammogram and ultrasound showed a 7 x 7 x 7 mm mass in the left breast at 11:00 position.      10/22/2015 Receptors her2    ER negative, PR negative, HER-2 not amplified      10/22/2015 Initial Biopsy    Left breast needle core biopsy at the 1:00 position showed invasive ductal carcinoma.      10/22/2015 Initial Diagnosis    Malignant neoplasm of upper outer quadrant of female breast ([place])      11/14/2015 Surgery    Left breast lumpectomy and sentinel lymph node biopsy      11/14/2015 Pathology Results    Left breast lumpectomy showed invasive ductal carcinoma, grade 3, 1.1 cm, resection margins were negative, sentinel lymph node biopsy showed benign breast parenchyma, lymph node tissue not identified. Repeated ER PR and HER-2 were all negative.      12/20/2015 - 03/13/2016 Chemotherapy    Weekly Taxol 80 mg/m, changed to Abraxane 100 mg/m from week 3 due to infusion reaction, and dose reduced from week 9 due to neuropathy.      04/01/2016 -  Radiation Therapy    Adjuvant breast radiation        HISTORY OF PRESENTING ILLNESS:  Julie [name] 82 y.o. female is here because of her recently  diagnosed left breast cancer. She has been seen my partner Dr. Julien Nordmann for MGUS, and was referred to me to discuss her adjuvant therapy for breast cancer. She is accompanied by her daughter to the clinic today.  Her breast cancer was discovered by screening mammogram. She has no palpable breast mass, skin change or nipple discharge. She denies any new constitutional symptoms. The diagnostic mammogram and ultrasound showed a 7 mm mass in the left breast, biopsy showed invasive ductal carcinoma, triple negative. She was seen by breast surgeon Dr. [name], and underwent left rest lumpectomy and sentinel lymph node biopsy on 11/14/2015.   She had a multiple orthopedic surgeries in the past, takes tramadol as needed. She lives with her daughter, pretty independent, does not exercise regularly, but it goes out for shopping, playing cards, etc.  GYN HISTORY  Menarchal: 16 LMP: 11 (hysterectomy) Contraceptive: 2 HRT: 1 yr  G5P5: she did breast feeding to 2 children   CURRENT THERAPY: adjuvant radiation   INTERIM HISTORY: Julie [name] returns for follow-up. She is tolerating radiation well overall.  The patient complains of finger and toe numbness. States the Gabapentin is not helping. She states she is unable to feel anything that is hot when she first touches it. Reports pain in her fingers and toes at times that she states is an 8/10. She stopped  taking Gabapentin about 2 days ago since she ran out. She states she has been taking Lyrica for years. She takes Tramadol for pain and takes it at night and in the morning. The pain sometimes wakes her up at night. She is worried about radiation. She reports the skin in her treatment area is "black." The patient also complains of back and left hip pain. She states she saw a physician and reports she was diagnosed with scoliosis. The patient states she was offered an injection for the pain, but the patient declined it. The patient also reports short term memory loss and  balance issues. The patient reports tenderness of the left areola. I spoke with the patient's daughter by phone during our visit today.  MEDICAL HISTORY:  Past Medical History:  Diagnosis Date  . Anemia   . Anxiety    takes Klonopin at bedtime  . Arthritis    takes Methotrexate weekly and Prednisone  . Blood transfusion    no abnormal reaction noted  . Cataracts, bilateral    immature  . Complication of anesthesia    wakes up during surgery  . Depression   . Diabetes mellitus without complication ([place])   . Dyspnea    with exertion since chemo   . Fibromyalgia    takes Lyrica daily  . GERD (gastroesophageal reflux disease)    takes Nexium daily  . Glaucoma   . Hard of hearing   . Heart murmur   . Histoplasmosis   . History of colon polyps   . History of gout   . History of hiatal hernia   . History of shingles   . Hyperlipidemia   . Hypertension    takes Amlodipine,Metoprolol,and Losartan daily  . Incontinence of bowel   . Interstitial cystitis   . Joint pain   . Nocturia   . Peripheral neuropathy ([place])   . PONV (postoperative nausea and vomiting)   . Ulcer ([place]) 1970   duodenal  . Urinary frequency   . Urinary urgency   . Weakness    numbness in both hands and feet    SURGICAL HISTORY: Past Surgical History:  Procedure Laterality Date  . ABDOMINAL HYSTERECTOMY    . accupuncture  3 yrs ago  . ANKLE FUSION Left 05/10/2014   Procedure: LEFT ANKLE ARTHRODESIS ;  Surgeon: Wylene Simmer, MD;  Location: [place];  Service: Orthopedics;  Laterality: Left;  . ANKLE SURGERY Left   . arm surgery Left    radius  . bladder tacked     . CERVICAL FUSION    . CHOLECYSTECTOMY    . COLONOSCOPY    . CYSTOCELE REPAIR    . DILATION AND CURETTAGE OF UTERUS    . ESOPHAGOGASTRODUODENOSCOPY    . PORT A CATH REVISION N/A 12/27/2015   Procedure: PORT A CATH REVISION;  Surgeon: Rolm Bookbinder, MD;  Location: [place];  Service: General;  Laterality: N/A;  .  PORT-A-CATH REMOVAL N/A 03/25/2016   Procedure: REMOVAL PORT-A-CATH;  Surgeon: Rolm Bookbinder, MD;  Location: [facility];  Service: General;  Laterality: N/A;  . PORTACATH PLACEMENT Right 12/27/2015   Procedure: INSERTION PORT-A-CATH WITH ULTRASOUND ;  Surgeon: Rolm Bookbinder, MD;  Location: [place];  Service: General;  Laterality: Right;  . RADIOACTIVE SEED GUIDED MASTECTOMY WITH AXILLARY SENTINEL LYMPH NODE BIOPSY Left 11/14/2015   Procedure: RADIOACTIVE SEED GUIDED LEFT BREAST LUMPECTOMY WITH AXILLARY SENTINEL LYMPH NODE BIOPSY;  Surgeon: Rolm Bookbinder, MD;  Location: [facility];  Service: General;  Laterality: Left;  RADIOACTIVE SEED GUIDED LEFT BREAST LUMPECTOMY WITH AXILLARY SENTINEL LYMPH NODE BIOPSY  . SALPINGOOPHORECTOMY Bilateral   . TOTAL KNEE ARTHROPLASTY Right 02/28/2015   Procedure: RIGHT TOTAL KNEE ARTHROPLASTY;  Surgeon: Samson Frederic, MD;  Location: [facility];  Service: Orthopedics;  Laterality: Right;  . UPPER GASTROINTESTINAL ENDOSCOPY    . VAGINAL HYSTERECTOMY      SOCIAL HISTORY: Social History   Social History  . Marital status: Divorced    Spouse name: N/A  . Number of children: 5  . Years of education: N/A   Occupational History  . Not on file.   Social History Main Topics  . Smoking status: Never Smoker  . Smokeless tobacco: Never Used  . Alcohol use No     Comment: IN [place] ONLY  . Drug use: No  . Sexual activity: Not on file   Other Topics Concern  . Not on file   Social History Narrative  . No narrative on file    FAMILY HISTORY: Family History  Problem Relation Age of Onset  . Colon cancer Sister 50  . Esophageal cancer Brother 66  . Breast cancer Mother   . Brain cancer Other     ALLERGIES:  is allergic to codeine; other; and aspirin.  MEDICATIONS:  Current Outpatient Prescriptions  Medication Sig Dispense Refill  . acetaminophen (TYLENOL) 500 MG tablet Take 500 mg by mouth every 6 (six) hours as  needed (for pain/fever.).     [name] amLODipine (NORVASC) 10 MG tablet Take 10 mg by mouth every morning.     . Biotin 33780 MCG TABS Take 1 tablet by mouth daily.    . calcium-vitamin D (OSCAL WITH D) 500-200 MG-UNIT per tablet Take 1 tablet by mouth every morning.    . clonazePAM (KLONOPIN) 0.5 MG tablet Take 0.5 mg by mouth at bedtime.    [name] ezetimibe (ZETIA) 10 MG tablet Take 10 mg by mouth daily.     . fish oil-omega-3 fatty acids 1000 MG capsule Take 1 g by mouth every morning.     . folic acid (FOLVITE) 1 MG tablet Take 2 mg by mouth every morning.     . gabapentin (NEURONTIN) 100 MG capsule TAKE ONE CAPSULE 3 TIMES A DAY 90 capsule 0  . hyaluronate sodium (RADIAPLEXRX) GEL Apply 1 application topically 2 (two) times daily.    . hydroxypropyl methylcellulose / hypromellose (ISOPTO TEARS / GONIOVISC) 2.5 % ophthalmic solution Place 1 drop into both eyes daily as needed for dry eyes.    . meloxicam (MOBIC) 15 MG tablet Take 15 mg by mouth daily.  11  . metFORMIN (GLUCOPHAGE) 500 MG tablet Take 500 mg by mouth daily.     . metoprolol succinate (TOPROL-XL) 50 MG 24 hr tablet TAKE 1 TABLET AT BEDTIME ONCE A DAY ORALLY 90 DAYS  2  . non-metallic deodorant (ALRA) MISC Apply 1 application topically daily as needed.    . ondansetron (ZOFRAN) 8 MG tablet Take 1 tablet (8 mg total) by mouth 2 (two) times daily as needed (Nausea or vomiting). 30 tablet 1  . pantoprazole (PROTONIX) 40 MG tablet TAKE 1 TABLET (40 MG TOTAL) BY MOUTH DAILY. 30 tablet 1  . pregabalin (LYRICA) 75 MG capsule Take 75 mg by mouth at bedtime.     . prochlorperazine (COMPAZINE) 10 MG tablet Take 1 tablet (10 mg total) by mouth every 6 (six) hours as needed (Nausea or vomiting). 30 tablet 1  .  traMADol (ULTRAM) 50 MG tablet TAKE 1 TABLET BY MOUTH EVERY 6-8 HOURS AS NEEDED X 30 DAYS 60 tablet 0  . valsartan-hydrochlorothiazide (DIOVAN-HCT) 320-12.5 MG per tablet Take 1 tablet by mouth daily.    . vitamin B-12 (CYANOCOBALAMIN) 1000 MCG  tablet Take 1,000 mcg by mouth daily.    . vitamin E 400 UNIT capsule Take 400 Units by mouth daily.     No current facility-administered medications for this visit.     REVIEW OF SYSTEMS: Constitutional: Denies fevers, chills or abnormal night sweats Eyes: Denies blurriness of vision, double vision or watery eyes Ears, nose, mouth, throat, and face: Denies mucositis or sore throat Respiratory: Denies cough, dyspnea or wheezes Cardiovascular: Denies palpitation, chest discomfort or lower extremity swelling Gastrointestinal:  Denies nausea, heartburn or change in bowel habits Skin: Denies abnormal skin rashes Musculoskeletal: (+) back pain, left hip pain, finger and toe pain. Lymphatics: Denies new lymphadenopathy or easy bruising Neurological: (+) Reports numbness of her fingers and toes. (+) Difficulty with balance. Behavioral/Psych: Mood is stable, no new changes. (+) short-term memory loss All other systems were reviewed with the patient and are negative.   PHYSICAL EXAMINATION: ECOG PERFORMANCE STATUS: 1 - Symptomatic but completely ambulatory Vitals:   05/05/16 0850  BP: (!) 142/80  Pulse: (!) 58  Resp: 18  Temp: 97.7 F (36.5 C)   GENERAL:alert, no distress and comfortable SKIN: skin color, texture, turgor are normal, no rashes or significant lesions. EYES: normal, conjunctiva are pink and non-injected, sclera clear OROPHARYNX:no exudate, no erythema and lips, buccal mucosa, and tongue normal  NECK: supple, thyroid normal size, non-tender, without nodularity LYMPH:  no palpable lymphadenopathy in the cervical, axillary or inguinal LUNGS: clear to auscultation and percussion with normal breathing effort HEART: regular rate & rhythm and no murmurs and no lower extremity edema ABDOMEN:abdomen soft, non-tender and normal bowel sounds MUSCULOSKELETAL:no cyanosis of digits and no clubbing. Ambulatory with a wheelchair and cane. PSYCH: alert & oriented x 3 with fluent  speech NEURO: no focal motor/sensory deficits BREAST: Pigmentation of the entire left breast from radiation. No ulcers or blisters.  LABORATORY DATA:  I have reviewed the data as listed CBC Latest Ref Rng & Units 05/05/2016 03/27/2016 03/13/2016  WBC 3.9 - 10.3 10e3/uL 5.2 7.2 3.1(L)  Hemoglobin 11.6 - 15.9 g/dL 11.6 10.2(L) 10.0(L)  Hematocrit 34.8 - 46.6 % 36.6 32.2(L) 30.9(L)  Platelets 145 - 400 10e3/uL 244 252 269   CMP Latest Ref Rng & Units 05/05/2016 03/27/2016 03/13/2016  Glucose 70 - 140 mg/dl 90 86 78  BUN 7.0 - 26.0 mg/dL 27.4(H) 31.9(H) 30.1(H)  Creatinine 0.6 - 1.1 mg/dL 1.0 1.0 1.1  Sodium 136 - 145 mEq/L 142 145 142  Potassium 3.5 - 5.1 mEq/L 4.2 4.7 3.9  Chloride 101 - 111 mmol/L - - -  CO2 22 - 29 mEq/L '27 26 22  '$ Calcium 8.4 - 10.4 mg/dL 9.5 9.6 9.4  Total Protein 6.4 - 8.3 g/dL 7.1 6.6 6.8  Total Bilirubin 0.20 - 1.20 mg/dL 0.64 0.58 0.55  Alkaline Phos 40 - 150 U/L 76 66 67  AST 5 - 34 U/L '24 18 19  '$ ALT 0 - 55 U/L '16 11 9   '$ ANC 4.4 (03/27/16)  PATHOLOGY REPORT  Diagnosis 11/14/2015 1. Breast, lumpectomy, Left - INVASIVE DUCTAL CARCINOMA, GRADE III/III, SPANNING 1.1 CM. - THE RESECTION MARGINS ARE NEGATIVE FOR CARCINOMA. - SEE ONCOLOGY TABLE BELOW. 2. Lymph node, sentinel, biopsy, Left Axillary - BENIGN BREAST PARENCHYMA. - LYMPH NODAL  TISSUE IS NOT IDENTIFIED. - THERE IS NO EVIDENCE OF MALIGNANCY. Microscopic Comment 1. BREAST, INVASIVE TUMOR, WITH LYMPH NODES PRESENT Specimen, including laterality and lymph node sampling (sentinel, non-sentinel): Left breast Procedure: Seed localized lumpectomy Histologic type: Ductal Grade: III Tubule formation: 2 Nuclear pleomorphism: 3 Mitotic: 3 Tumor size (gross measurement): 1.1 cm Margins: Greater than 0.2 cm to all margins Lymphovascular invasion: Not identified Ductal carcinoma in situ: Not identified. Lobular neoplasia: Not identified. Tumor focality: Unifocal Treatment effect: N/A Extent of tumor:  Confined to breast parenchyma Lymph nodes: None examined. Breast prognostic profile: Case [id number] Estrogen receptor: 0% Progesterone receptor: 0% Her 2 neu: No amplification was detected. Ki-67: 80% A breast prognostic profile will be repeated on the case and the results reported separately. Non-neoplastic breast: No significant findings. TNM: pT1c, pNX (JBK:kh 11-15-15) 2 of 4 FINAL for Reddoch, Beatrix L ([id number]) Enid Cutter MD Pathologist, Electronic  Results: IMMUNOHISTOCHEMICAL AND MORPHOMETRIC ANALYSIS PERFORMED MANUALLY Estrogen Receptor: 0%, NEGATIVE Progesterone Receptor: 0%, NEGATIVE  Results: HER2 - NEGATIVE RATIO OF HER2/CEP17 SIGNALS 1.32 AVERAGE HER2 COPY NUMBER PER CELL 3.10   RADIOGRAPHIC STUDIES: I have personally reviewed the radiological images as listed and agreed with the findings in the report. No results found.  ASSESSMENT & PLAN:  82 y.o. female, with past medical history of MGUS, hypertension, arthritis, with good performance status, was found to have stage I triple negative left breast cancer by screening mammogram.  1. Malignant or new present of upper outer quadrant of left female breast, invasive ductal carcinoma, grade 3, pT1cN0M0, stage IA, ER-/PR-/HER2- --I previously discussed her breast mammogram, ultrasound, biopsy and final surgical path result in details -She had sentinel lymph node biopsy, however it showed normal breast parenchyma, no lymphoid tissue, her pathological node status is unknown, no ultrasound evidence of adenopathy. -We previously reviewed the aggressive nature of triple negative breast cancer. Giving her stage I disease, I think she probably has 20-30% risk of distant recurrence in the next 5-10 years.  -I recommend weekly Taxol for 12 weeks as adjuvant chemotherapy to reduce her risk of cancer recurrence. -She has completed chemotherapy. Taxol was changed to Abraxane from week 3 due to infusion reaction. -She has  residual neuropathy. We discuss that neuropathy recover slowly  - I discussed breast cancer surveillance and survivorship. The patient is interested in a survivorship referral. I will see her every 3-4 months for the next 2-3 years, then every 6 -12 months afterwards. She will continue annual mammogram -I encourage her to eat healy and exercise as much as possible   2. MGUS -will continue follow up SPEP/IFE, and light chain levels every 3 months -Next labs will be obtained before her next visit in February 2018.  3. Anemia  -she has chronic anemia, worse last year due to her surgery  -Overall improved, we'll continue monitoring -I encouraged the patient to continue eating well.  4. Joint and muscular pain, Rheumatoid arthritis -She has constant low back and left hip pain, possible related to Taxol and RA (she is off MTX and prednisone now) -While undergoing XRT, she was discontinued from methotrexate previously prescribed by her rheumatologist Dr. Dossie Der. After XRT is completed, we will discuss restarting this medication. -I previously encouraged her to take the calcium and magnesium over-the-counter, drink fluids adequately. -We'll continue monitoring -Tramadol refilled today.  5. Mild peripheral neuropathy, G1-2 -No impact on her function, we'll continue monitor closely -We decreased her Abraxane dose for the last 2 treatments. -She has mild gait instability due  to neuropathy and fatigue, I previously recommended her to get physical therapy, she is interested.  6. Cataracts -The patient will have surgery for cataracts in the near future on 06/03/2016 (right eye).  Plan -She will continue with XRT with Dr. Sondra Come, plan to finish on 12/18  -Survivorship clinic referral to our Survivorship Navigator, Mike Craze, NP. -Continue Tramadol for pain. I refilled Tramadol '50mg'$  60 tablets. -MGUS labs in February 2018, 1 week before her follow up in 2 months   All questions were answered.  The patient knows to call the clinic with any problems, questions or concerns.  I spent 20 minutes counseling the patient face to face. The total time spent in the appointment was 25 minutes and more than 50% was on counseling.     Truitt Merle, MD 05/05/2016   This document serves as a record of services personally performed by Truitt Merle, MD. It was created on her behalf by Darcus Austin, a trained medical scribe. The creation of this record is based on the scribe's personal observations and the provider's statements to them. This document has been checked and approved by the attending provider.

## 2016-05-05 ENCOUNTER — Ambulatory Visit
Admission: RE | Admit: 2016-05-05 | Discharge: 2016-05-05 | Disposition: A | Discharge status: Home / Self Care | Payer: Medicare Other | Source: Ambulatory Visit | Attending: Radiation Oncology | Admitting: Radiation Oncology

## 2016-05-05 ENCOUNTER — Other Ambulatory Visit (HOSPITAL_BASED_OUTPATIENT_CLINIC_OR_DEPARTMENT_OTHER): Payer: Medicare Other

## 2016-05-05 ENCOUNTER — Encounter: Payer: Self-pay | Admitting: Hematology

## 2016-05-05 ENCOUNTER — Ambulatory Visit (HOSPITAL_BASED_OUTPATIENT_CLINIC_OR_DEPARTMENT_OTHER): Payer: Medicare Other | Admitting: Hematology

## 2016-05-05 VITALS — BP 142/80 | HR 58 | Temp 97.7°F | Resp 18 | Ht 63.0 in | Wt 213.5 lb

## 2016-05-05 DIAGNOSIS — C50411 Malignant neoplasm of upper-outer quadrant of right female breast: Secondary | ICD-10-CM

## 2016-05-05 DIAGNOSIS — D649 Anemia, unspecified: Secondary | ICD-10-CM | POA: Diagnosis not present

## 2016-05-05 DIAGNOSIS — Z171 Estrogen receptor negative status [ER-]: Secondary | ICD-10-CM | POA: Diagnosis not present

## 2016-05-05 DIAGNOSIS — C50412 Malignant neoplasm of upper-outer quadrant of left female breast: Secondary | ICD-10-CM | POA: Diagnosis present

## 2016-05-05 DIAGNOSIS — G629 Polyneuropathy, unspecified: Secondary | ICD-10-CM

## 2016-05-05 DIAGNOSIS — H269 Unspecified cataract: Secondary | ICD-10-CM

## 2016-05-05 DIAGNOSIS — D472 Monoclonal gammopathy: Secondary | ICD-10-CM

## 2016-05-05 DIAGNOSIS — Z510 Encounter for antineoplastic radiation therapy: Secondary | ICD-10-CM | POA: Diagnosis not present

## 2016-05-05 LAB — CBC WITH DIFFERENTIAL/PLATELET
BASO%: 1.2 % (ref 0.0–2.0)
Basophils Absolute: 0.1 10*3/uL (ref 0.0–0.1)
EOS%: 3.4 % (ref 0.0–7.0)
Eosinophils Absolute: 0.2 10*3/uL (ref 0.0–0.5)
HCT: 36.6 % (ref 34.8–46.6)
HGB: 11.6 g/dL (ref 11.6–15.9)
LYMPH%: 14.2 % (ref 14.0–49.7)
MCH: 26.9 pg (ref 25.1–34.0)
MCHC: 31.7 g/dL (ref 31.5–36.0)
MCV: 84.7 fL (ref 79.5–101.0)
MONO#: 0.6 10*3/uL (ref 0.1–0.9)
MONO%: 11.2 % (ref 0.0–14.0)
NEUT#: 3.6 10*3/uL (ref 1.5–6.5)
NEUT%: 70 % (ref 38.4–76.8)
PLATELETS: 244 10*3/uL (ref 145–400)
RBC: 4.32 10*6/uL (ref 3.70–5.45)
RDW: 15.6 % — ABNORMAL HIGH (ref 11.2–14.5)
WBC: 5.2 10*3/uL (ref 3.9–10.3)
lymph#: 0.7 10*3/uL — ABNORMAL LOW (ref 0.9–3.3)

## 2016-05-05 LAB — COMPREHENSIVE METABOLIC PANEL
ALT: 16 U/L (ref 0–55)
ANION GAP: 8 meq/L (ref 3–11)
AST: 24 U/L (ref 5–34)
Albumin: 3.6 g/dL (ref 3.5–5.0)
Alkaline Phosphatase: 76 U/L (ref 40–150)
BUN: 27.4 mg/dL — ABNORMAL HIGH (ref 7.0–26.0)
CHLORIDE: 106 meq/L (ref 98–109)
CO2: 27 meq/L (ref 22–29)
CREATININE: 1 mg/dL (ref 0.6–1.1)
Calcium: 9.5 mg/dL (ref 8.4–10.4)
EGFR: 62 mL/min/{1.73_m2} — ABNORMAL LOW (ref >90)
Glucose: 90 mg/dl (ref 70–140)
Potassium: 4.2 mEq/L (ref 3.5–5.1)
Sodium: 142 mEq/L (ref 136–145)
Total Bilirubin: 0.64 mg/dL (ref 0.20–1.20)
Total Protein: 7.1 g/dL (ref 6.4–8.3)

## 2016-05-05 MED ORDER — TRAMADOL HCL 50 MG PO TABS: ORAL_TABLET | ORAL | 0 refills | Status: DC

## 2016-05-05 NOTE — Progress Notes (Signed)
  Radiation Oncology         [phone] ________________________________  Name: Julie [name] MRN: [id number]  Date: 05/05/2016  DOB: July 08, 1932  Weekly Radiation Therapy Management     ICD-9-CM ICD-10-CM   1. Malignant neoplasm of upper-outer quadrant of left breast in female, estrogen receptor negative ([facility]CC) 174.4 C50.412    V86.1 Z17.1        Current Dose: 43.2 Gy     Planned Dose:  50.4 Gy  Narrative . . . . . . . . The patient presents for routine under treatment assessment.                                      The patient has completed 24 fractions to her left breast. She reports a burning, aching pain that she is rating at a 4/10 in severity in her left breast; she is taking Tramadol for the pain. She denies fatigue or difficulty sleeping at night. The patient reports using radiaplex as directed. She saw Dr. Burr Medico today.                                  Set-up films were reviewed.                                 The chart was checked. Physical Findings. . .  Vitals with BMI 05/05/2016  Height 5\' 3"   Weight 213 lbs 8 oz  BMI [id number]  Systolic [id number]  Diastolic 80  Pulse 58  Respirations 18  Vitals taken with Dr. Burr Medico today. Weight essentially stable.  Lungs are clear to auscultation bilaterally. Heart has regular rate and rhythm. Hyperpigmentation changes in the breast, with some erythema, no skin breakdown noted. Impression . . . . . . . The patient is tolerating radiation. Plan . . . . . . . . . . . . Continue treatment as planned.  ________________________________   Blair Promise, PhD, MD  This document serves as a record of services personally performed by Gery Pray, MD. It was created on his behalf by Maryla Morrow, a trained medical scribe. The creation of this record is based on the scribe's personal observations and the provider's statements to them. This document has been checked and approved by the attending provider.

## 2016-05-05 NOTE — Progress Notes (Signed)
Julie [name] has completed 24 fractions to her left breast.  She reports having burning pain and aching that she is rating at a 4/10 in her left breast.  She denies having fatigue.  She is using radiaplex.  She saw Dr. Burr Medico today.  The skin on her left breast is red.  Wt Readings from Last 3 Encounters:  05/05/16 213 lb 8 oz (96.8 kg)  04/28/16 211 lb (95.7 kg)  04/21/16 212 lb 9.6 oz (96.4 kg)

## 2016-05-06 ENCOUNTER — Ambulatory Visit
Admission: RE | Admit: 2016-05-06 | Discharge: 2016-05-06 | Disposition: A | Discharge status: Home / Self Care | Payer: Medicare Other | Source: Ambulatory Visit | Attending: Radiation Oncology | Admitting: Radiation Oncology

## 2016-05-06 DIAGNOSIS — C50412 Malignant neoplasm of upper-outer quadrant of left female breast: Secondary | ICD-10-CM | POA: Diagnosis not present

## 2016-05-06 DIAGNOSIS — Z510 Encounter for antineoplastic radiation therapy: Secondary | ICD-10-CM | POA: Diagnosis not present

## 2016-05-06 DIAGNOSIS — Z171 Estrogen receptor negative status [ER-]: Secondary | ICD-10-CM | POA: Diagnosis not present

## 2016-05-07 ENCOUNTER — Telehealth: Payer: Self-pay | Admitting: Hematology

## 2016-05-07 ENCOUNTER — Ambulatory Visit
Admission: RE | Admit: 2016-05-07 | Discharge: 2016-05-07 | Disposition: A | Discharge status: Home / Self Care | Payer: Medicare Other | Source: Ambulatory Visit | Attending: Radiation Oncology | Admitting: Radiation Oncology

## 2016-05-07 DIAGNOSIS — Z510 Encounter for antineoplastic radiation therapy: Secondary | ICD-10-CM | POA: Diagnosis not present

## 2016-05-07 DIAGNOSIS — Z171 Estrogen receptor negative status [ER-]: Secondary | ICD-10-CM | POA: Diagnosis not present

## 2016-05-07 DIAGNOSIS — C50412 Malignant neoplasm of upper-outer quadrant of left female breast: Secondary | ICD-10-CM | POA: Diagnosis not present

## 2016-05-07 NOTE — Telephone Encounter (Signed)
Appointments scheduled per 12/14 LOS. Patient given AVS report and calendars with future scheduled appointments.  °

## 2016-05-08 ENCOUNTER — Ambulatory Visit
Admission: RE | Admit: 2016-05-08 | Discharge: 2016-05-08 | Disposition: A | Discharge status: Home / Self Care | Payer: Medicare Other | Source: Ambulatory Visit | Attending: Radiation Oncology | Admitting: Radiation Oncology

## 2016-05-08 DIAGNOSIS — C50412 Malignant neoplasm of upper-outer quadrant of left female breast: Secondary | ICD-10-CM | POA: Diagnosis not present

## 2016-05-08 DIAGNOSIS — Z171 Estrogen receptor negative status [ER-]: Secondary | ICD-10-CM | POA: Diagnosis not present

## 2016-05-08 DIAGNOSIS — Z510 Encounter for antineoplastic radiation therapy: Secondary | ICD-10-CM | POA: Diagnosis not present

## 2016-05-11 ENCOUNTER — Encounter: Payer: Self-pay | Admitting: Radiation Oncology

## 2016-05-11 ENCOUNTER — Telehealth: Payer: Self-pay | Admitting: *Deleted

## 2016-05-11 ENCOUNTER — Ambulatory Visit
Admission: RE | Admit: 2016-05-11 | Discharge: 2016-05-11 | Disposition: A | Discharge status: Home / Self Care | Payer: Medicare Other | Source: Ambulatory Visit | Attending: Radiation Oncology | Admitting: Radiation Oncology

## 2016-05-11 VITALS — BP 154/76 | HR 57 | Temp 98.0°F | Ht 63.0 in | Wt 211.4 lb

## 2016-05-11 DIAGNOSIS — Z510 Encounter for antineoplastic radiation therapy: Secondary | ICD-10-CM | POA: Diagnosis not present

## 2016-05-11 DIAGNOSIS — Z171 Estrogen receptor negative status [ER-]: Secondary | ICD-10-CM | POA: Diagnosis not present

## 2016-05-11 DIAGNOSIS — C50411 Malignant neoplasm of upper-outer quadrant of right female breast: Secondary | ICD-10-CM

## 2016-05-11 DIAGNOSIS — C50412 Malignant neoplasm of upper-outer quadrant of left female breast: Secondary | ICD-10-CM | POA: Diagnosis not present

## 2016-05-11 MED ORDER — RADIAPLEXRX EX GEL
Freq: Once | CUTANEOUS | Status: AC
  Administered 2016-05-11: 12:00:00 via TOPICAL

## 2016-05-11 NOTE — Telephone Encounter (Signed)
  Oncology Nurse Navigator Documentation  Navigator Location: [facility] (05/11/16 1300)   )Navigator Encounter Type: Telephone (05/11/16 1300) Telephone: [place] Call (05/11/16 1300)                   Patient Visit Type: [id number] (05/11/16 1300) Treatment Phase: Final Radiation Tx (05/11/16 1300)                            Time Spent with Patient: 15 (05/11/16 1300)

## 2016-05-11 NOTE — Progress Notes (Signed)
  Radiation Oncology         [phone] ________________________________  Name: Julie [name] MRN: [id number]  Date: 05/11/2016  DOB: February 01, 1933  Weekly Radiation Therapy Management     ICD-9-CM ICD-10-CM   1. Malignant neoplasm of upper-outer quadrant of left breast in female, estrogen receptor negative ([facility]CC) 174.4 C50.412    V86.1 Z17.1        Current Dose: 50.4 Gy     Planned Dose:  50.4 Gy  Narrative . . . . . . . . The patient presents for routine under treatment assessment.                                      The patient has completed 28 fractions to her left breast. She reports discomfort under her left breast. She denies having fatigue. She is using Radiaplex and has been provided with a refill. Per nursing, skin on her left breast is erythematous with hyperpigmentation.                                   Set-up films were reviewed.                                 The chart was checked. Physical Findings. . Julie [name] with BMI 05/11/2016  Height 5\' 3"   Weight 211 lbs 6 oz  BMI [id number]  Systolic 123456  Diastolic 76  Pulse 57  Respirations     Weight essentially stable.  Lungs are clear to auscultation bilaterally. Heart has regular rate and rhythm. Hyperpigmentation changes in the left breast, with some erythema, no skin breakdown noted. Impression . . . . . . . The patient sis toleratied radiation. Plan . . . . . . . . . . . .  One month follow-up appointment card has been given to the patient.  ________________________________   Blair Promise, PhD, MD  This document serves as a record of services personally performed by Gery Pray, MD. It was created on his behalf by Bethann Humble, a trained medical scribe. The creation of this record is based on the scribe's personal observations and the provider's statements to them. This document has been checked and approved by the attending provider.

## 2016-05-11 NOTE — Progress Notes (Signed)
Julie [name] has completed 28 fractions to her left breast.  She reports having some discomfort under her left breast.  She denies having fatigue.  She is using radiaplex and has been provided with a refill.  She has been given a one month follow up appointment.  The skin on her left breast is red with hyperpigmentation..     BP (!) 154/76 (BP Location: Right Arm, Patient Position: Sitting)   Pulse (!) 57   Temp 98 F (36.7 C) (Oral)   Ht 5\' 3"  (1.6 m)   Wt 211 lb 6.4 oz (95.9 kg)   SpO2 100%   BMI 37.45 kg/m    Wt Readings from Last 3 Encounters:  05/11/16 211 lb 6.4 oz (95.9 kg)  05/05/16 213 lb 8 oz (96.8 kg)  04/28/16 211 lb (95.7 kg)

## 2016-05-12 ENCOUNTER — Encounter (HOSPITAL_COMMUNITY): Payer: Self-pay

## 2016-05-12 ENCOUNTER — Emergency Department (HOSPITAL_COMMUNITY)
Admission: EM | Admit: 2016-05-12 | Discharge: 2016-05-12 | Disposition: A | Discharge status: Home / Self Care | Payer: Medicare Other | Attending: Emergency Medicine | Admitting: Emergency Medicine

## 2016-05-12 DIAGNOSIS — E1140 Type 2 diabetes mellitus with diabetic neuropathy, unspecified: Secondary | ICD-10-CM | POA: Diagnosis not present

## 2016-05-12 DIAGNOSIS — Z96651 Presence of right artificial knee joint: Secondary | ICD-10-CM | POA: Diagnosis not present

## 2016-05-12 DIAGNOSIS — K5903 Drug induced constipation: Secondary | ICD-10-CM | POA: Diagnosis not present

## 2016-05-12 DIAGNOSIS — Z853 Personal history of malignant neoplasm of breast: Secondary | ICD-10-CM | POA: Insufficient documentation

## 2016-05-12 DIAGNOSIS — Z7984 Long term (current) use of oral hypoglycemic drugs: Secondary | ICD-10-CM | POA: Insufficient documentation

## 2016-05-12 DIAGNOSIS — Z79899 Other long term (current) drug therapy: Secondary | ICD-10-CM | POA: Insufficient documentation

## 2016-05-12 DIAGNOSIS — I10 Essential (primary) hypertension: Secondary | ICD-10-CM | POA: Insufficient documentation

## 2016-05-12 DIAGNOSIS — K5641 Fecal impaction: Secondary | ICD-10-CM

## 2016-05-12 MED ORDER — FLEET ENEMA 7-19 GM/118ML RE ENEM
1.0000 | ENEMA | Freq: Once | RECTAL | Status: AC
Start: 2016-05-12 — End: 2016-05-12
  Administered 2016-05-12: 1 via RECTAL
  Filled 2016-05-12: qty 1

## 2016-05-12 MED ORDER — POLYETHYLENE GLYCOL 3350 17 GM/SCOOP PO POWD: ORAL | 0 refills | Status: DC

## 2016-05-12 MED ORDER — LIDOCAINE HCL 2 % EX GEL
1.0000 "application " | Freq: Once | CUTANEOUS | Status: AC
  Administered 2016-05-12: 1 via TOPICAL
  Filled 2016-05-12: qty 20

## 2016-05-12 NOTE — ED Notes (Signed)
Pt able to ambulate independently 

## 2016-05-12 NOTE — ED Notes (Signed)
Pt attempted BM on bedside commode after digital disimpaction without any success.  Pt crying, stating it hurts too much.  Pt stated it felt like "my rectum is hanging out".  This RN verified no prolapse and assisted pt back to bed.  Pt now requesting enema.  Primary RN aware.

## 2016-05-12 NOTE — ED Provider Notes (Signed)
[facility] DEPT Provider Note   CSN: [id number] Arrival date & time: 05/12/16  1454     History   Chief Complaint Chief Complaint  Patient presents with  . Fecal Impaction    HPI Julie [name] is a 82 y.o. female.  The history is provided by the patient.  Constipation   This is a new problem. Episode onset: 4 days ago. The stool is described as formed. There is no fiber in the patient's diet. Treatments tried: t 730 with no change. Risk factors: chemo and radiation, on chronic pain regimen.    Past Medical History:  Diagnosis Date  . Anemia   . Anxiety    takes Klonopin at bedtime  . Arthritis    takes Methotrexate weekly and Prednisone  . Blood transfusion    no abnormal reaction noted  . Cataracts, bilateral    immature  . Complication of anesthesia    wakes up during surgery  . Depression   . Diabetes mellitus without complication ([place])   . Dyspnea    with exertion since chemo   . Fibromyalgia    takes Lyrica daily  . GERD (gastroesophageal reflux disease)    takes Nexium daily  . Glaucoma   . Hard of hearing   . Heart murmur   . Histoplasmosis   . History of colon polyps   . History of gout   . History of hiatal hernia   . History of shingles   . Hyperlipidemia   . Hypertension    takes Amlodipine,Metoprolol,and Losartan daily  . Incontinence of bowel   . Interstitial cystitis   . Joint pain   . Nocturia   . Peripheral neuropathy ([place])   . PONV (postoperative nausea and vomiting)   . Ulcer ([place]) 1970   duodenal  . Urinary frequency   . Urinary urgency   . Weakness    numbness in both hands and feet    Patient Active Problem List   Diagnosis Date Noted  . Anemia due to antineoplastic chemotherapy 03/06/2016  . Hypersensitivity reaction 01/07/2016  . Port catheter in place 01/03/2016  . Malignant neoplasm of upper outer quadrant of female breast ([place]) 10/30/2015  . Primary osteoarthritis of right knee 02/28/2015  . Post-traumatic  arthritis of ankle 05/10/2014  . MGUS (monoclonal gammopathy of unknown significance) 02/17/2012  . Diverticulosis of colon (without mention of hemorrhage) 01/21/2011  . Family history of malignant neoplasm of gastrointestinal tract 01/21/2011  . Special screening for malignant neoplasms, colon 01/21/2011  . Other symptoms involving digestive system(787.99) 01/21/2011    Past Surgical History:  Procedure Laterality Date  . ABDOMINAL HYSTERECTOMY    . accupuncture  3 yrs ago  . ANKLE FUSION Left 05/10/2014   Procedure: LEFT ANKLE ARTHRODESIS ;  Surgeon: Wylene Simmer, MD;  Location: [facility];  Service: Orthopedics;  Laterality: Left;  . ANKLE SURGERY Left   . arm surgery Left    radius  . bladder tacked     . CERVICAL FUSION    . CHOLECYSTECTOMY    . COLONOSCOPY    . CYSTOCELE REPAIR    . DILATION AND CURETTAGE OF UTERUS    . ESOPHAGOGASTRODUODENOSCOPY    . PORT A CATH REVISION N/A 12/27/2015   Procedure: PORT A CATH REVISION;  Surgeon: Rolm Bookbinder, MD;  Location: [facility];  Service: General;  Laterality: N/A;  . PORT-A-CATH REMOVAL N/A 03/25/2016   Procedure: REMOVAL PORT-A-CATH;  Surgeon: Rolm Bookbinder, MD;  Location: [facility];  Service: General;  Laterality: N/A;  . PORTACATH PLACEMENT Right 12/27/2015   Procedure: INSERTION PORT-A-CATH WITH ULTRASOUND ;  Surgeon: Rolm Bookbinder, MD;  Location: [facility];  Service: General;  Laterality: Right;  . RADIOACTIVE SEED GUIDED MASTECTOMY WITH AXILLARY SENTINEL LYMPH NODE BIOPSY Left 11/14/2015   Procedure: RADIOACTIVE SEED GUIDED LEFT BREAST LUMPECTOMY WITH AXILLARY SENTINEL LYMPH NODE BIOPSY;  Surgeon: Rolm Bookbinder, MD;  Location: [facility];  Service: General;  Laterality: Left;  RADIOACTIVE SEED GUIDED LEFT BREAST LUMPECTOMY WITH AXILLARY SENTINEL LYMPH NODE BIOPSY  . SALPINGOOPHORECTOMY Bilateral   . TOTAL KNEE ARTHROPLASTY Right 02/28/2015   Procedure: RIGHT TOTAL KNEE  ARTHROPLASTY;  Surgeon: Rod Can, MD;  Location: [facility];  Service: Orthopedics;  Laterality: Right;  . UPPER GASTROINTESTINAL ENDOSCOPY    . VAGINAL HYSTERECTOMY      OB History    No data available       Home Medications    Prior to Admission medications   Medication Sig Start Date End Date Taking? Authorizing Provider  acetaminophen (TYLENOL) 500 MG tablet Take 500 mg by mouth every 6 (six) hours as needed (for pain/fever.).     Historical Provider, MD  amLODipine (NORVASC) 10 MG tablet Take 10 mg by mouth every morning.     Historical Provider, MD  Biotin 10000 MCG TABS Take 1 tablet by mouth daily.    Historical Provider, MD  calcium-vitamin D (OSCAL WITH D) 500-200 MG-UNIT per tablet Take 1 tablet by mouth every morning.    Historical Provider, MD  clonazePAM (KLONOPIN) 0.5 MG tablet Take 0.5 mg by mouth at bedtime.    Historical Provider, MD  ezetimibe (ZETIA) 10 MG tablet Take 10 mg by mouth daily.     Historical Provider, MD  fish oil-omega-3 fatty acids 1000 MG capsule Take 1 g by mouth every morning.     Historical Provider, MD  folic acid (FOLVITE) 1 MG tablet Take 2 mg by mouth every morning.     Historical Provider, MD  gabapentin (NEURONTIN) 100 MG capsule TAKE ONE CAPSULE 3 TIMES A DAY Patient not taking: Reported on 05/11/2016 04/21/16   Truitt Merle, MD  hyaluronate sodium (RADIAPLEXRX) GEL Apply 1 application topically 2 (two) times daily.    Historical Provider, MD  hydroxypropyl methylcellulose / hypromellose (ISOPTO TEARS / GONIOVISC) 2.5 % ophthalmic solution Place 1 drop into both eyes daily as needed for dry eyes.    Historical Provider, MD  meloxicam (MOBIC) 15 MG tablet Take 15 mg by mouth daily. 11/09/15   Historical Provider, MD  metFORMIN (GLUCOPHAGE) 500 MG tablet Take 500 mg by mouth daily.     Historical Provider, MD  metoprolol succinate (TOPROL-XL) 50 MG 24 hr tablet TAKE 1 TABLET AT BEDTIME ONCE A DAY ORALLY 90 DAYS 08/29/15   Historical Provider, MD    non-metallic deodorant Jethro Poling) MISC Apply 1 application topically daily as needed.    Historical Provider, MD  ondansetron (ZOFRAN) 8 MG tablet Take 1 tablet (8 mg total) by mouth 2 (two) times daily as needed (Nausea or vomiting). 12/20/15   Truitt Merle, MD  pantoprazole (PROTONIX) 40 MG tablet TAKE 1 TABLET (40 MG TOTAL) BY MOUTH DAILY. 04/24/16   Truitt Merle, MD  pregabalin (LYRICA) 75 MG capsule Take 75 mg by mouth at bedtime.     Historical Provider, MD  prochlorperazine (COMPAZINE) 10 MG tablet Take 1 tablet (10 mg total) by mouth every 6 (six) hours as needed (Nausea or vomiting). 12/20/15  Truitt Merle, MD  traMADol (ULTRAM) 50 MG tablet TAKE 1 TABLET BY MOUTH EVERY 6-8 HOURS AS NEEDED X 30 DAYS 05/05/16   Truitt Merle, MD  valsartan-hydrochlorothiazide (DIOVAN-HCT) 320-12.5 MG per tablet Take 1 tablet by mouth daily.    Historical Provider, MD  vitamin B-12 (CYANOCOBALAMIN) 1000 MCG tablet Take 1,000 mcg by mouth daily.    Historical Provider, MD  vitamin E 400 UNIT capsule Take 400 Units by mouth daily.    Historical Provider, MD    Family History Family History  Problem Relation Age of Onset  . Colon cancer Sister 67  . Esophageal cancer Brother 51  . Breast cancer Mother   . Brain cancer Other     Social History Social History  Substance Use Topics  . Smoking status: Never Smoker  . Smokeless tobacco: Never Used  . Alcohol use No     Comment: IN [facility] ONLY     Allergies   Codeine; Other; and Aspirin   Review of Systems Review of Systems  Gastrointestinal: Positive for constipation.  All other systems reviewed and are negative.    Physical Exam Updated Vital Signs BP 149/74   Pulse (!) 59   Temp 98 F (36.7 C) (Oral)   Resp 18   Ht 5\' 3"  (1.6 m)   Wt 211 lb (95.7 kg)   SpO2 97%   BMI 37.38 kg/m   Physical Exam  Constitutional: She is oriented to person, place, and time. She appears well-developed and well-nourished. No distress.  HENT:  Head: Normocephalic.   Nose: Nose normal.  Eyes: Conjunctivae are normal.  Neck: Neck supple. No tracheal deviation present.  Cardiovascular: Normal rate and regular rhythm.   Pulmonary/Chest: Effort normal. No respiratory distress.  Abdominal: Soft. She exhibits no distension. There is no tenderness. There is no rebound and no guarding.  Genitourinary:  Genitourinary Comments: Impacted hard stool ball in rectal vault  Neurological: She is alert and oriented to person, place, and time.  Skin: Skin is warm and dry.  Psychiatric: She has a normal mood and affect.     ED Treatments / Results  Labs (all labs ordered are listed, but only abnormal results are displayed) Labs Reviewed - No data to display  EKG  EKG Interpretation None       Radiology No results found.  Procedures Procedures (including critical care time)  Medications Ordered in ED Medications - No data to display   Initial Impression / Assessment and Plan / ED Course  I have reviewed the triage vital signs and the nursing notes.  Pertinent labs & imaging results that were available during my care of the patient were reviewed by me and considered in my medical decision making (see chart for details).  Clinical Course     82 year old female on chronic pain regimen presents with constipation over the last 4 days and feeling of rectal pain. She has impacted stool ball in the rectum which was manually disimpacted with chaperone present. She was allowed to have a bowel movement in the emergency department with relief of her symptoms. Patient was provided instructions for MiraLAX bowel clean out and enemas as needed for home therapy. Plan to follow up with PCP as needed and return precautions discussed for worsening or new concerning symptoms.   Final Clinical Impressions(s) / ED Diagnoses   Final diagnoses:  Fecal impaction ([place])  Drug-induced constipation    New Prescriptions Discharge Medication List as of 05/12/2016  7:34 PM  START taking these medications   Details  polyethylene glycol powder (MIRALAX) powder TAKE 6 CAPFULS OF MIRALAX IN A 32 OUNCE GATORADE AND DRINK THE WHOLE BEVERAGE FOLLOWED BY 3 CAPFULS TWICE A DAY FOR THE NEXT WEEK AND FOLLOW UP WITH YOUR PRIMARY CARE PHYSICIAN., Print         Leo Grosser, MD 05/13/16 [phone]

## 2016-05-12 NOTE — ED Triage Notes (Signed)
Per PT, Pt is coming from home with complaints of constipation "for a couple days." Pt unable to state when she has had a bowel movement. Reports pain in abdomen.

## 2016-05-12 NOTE — Progress Notes (Signed)
  Radiation Oncology         [phone] ________________________________  Name: Julie [name] MRN: [id number]  Date: 05/11/2016  DOB: [birth year]  End of Treatment Note  Diagnosis:   Stage IA (pT1c, pN0) grade 3 invasive ductal carcinoma of the UOQ of the left breast (triple negative)     Indication for treatment: Curative, with chemotherapy, post-op to reduce the risk of recurrence  Radiation treatment dates: 04/01/16 - 05/11/16  Site/dose:  Left breast: 50.4 Gy in 28 fractions  Beams/energy:   3D // [employer]  Narrative: The patient tolerated radiation treatment relatively well. The patient developed discomfort under the left breast and a burning/aching pain in the left breast for which she took Tramadol. There was erythema and hyperpigmentation changes in the left breast with no skin breakdown.  Plan: The patient has completed radiation treatment. The patient will return to radiation oncology clinic for routine followup in one month. I advised them to call or return sooner if they have any questions or concerns related to their recovery or treatment.  -----------------------------------  Blair Promise, PhD, MD  This document serves as a record of services personally performed by Gery Pray, MD. It was created on his behalf by Darcus Austin, a trained medical scribe. The creation of this record is based on the scribe's personal observations and the provider's statements to them. This document has been checked and approved by the attending provider.

## 2016-05-12 NOTE — ED Notes (Signed)
Pt able to have BM. Pt requesting additional enema.

## 2016-05-15 ENCOUNTER — Other Ambulatory Visit: Payer: Self-pay | Admitting: Medical Oncology

## 2016-05-15 DIAGNOSIS — Z170 Estrogen receptor positive status [ER+]: Principal | ICD-10-CM

## 2016-05-15 DIAGNOSIS — C50411 Malignant neoplasm of upper-outer quadrant of right female breast: Secondary | ICD-10-CM

## 2016-05-15 MED ORDER — PANTOPRAZOLE SODIUM 40 MG PO TBEC: 40.0000 mg | DELAYED_RELEASE_TABLET | Freq: Every day | ORAL | 0 refills | Status: DC

## 2016-05-20 DIAGNOSIS — Z471 Aftercare following joint replacement surgery: Secondary | ICD-10-CM | POA: Diagnosis not present

## 2016-05-20 DIAGNOSIS — Z96651 Presence of right artificial knee joint: Secondary | ICD-10-CM | POA: Diagnosis not present

## 2016-05-21 ENCOUNTER — Other Ambulatory Visit: Payer: Medicare Other

## 2016-05-28 ENCOUNTER — Ambulatory Visit: Payer: Medicare Other | Admitting: Internal Medicine

## 2016-06-03 DIAGNOSIS — H2512 Age-related nuclear cataract, left eye: Secondary | ICD-10-CM | POA: Diagnosis not present

## 2016-06-03 DIAGNOSIS — H2511 Age-related nuclear cataract, right eye: Secondary | ICD-10-CM | POA: Diagnosis not present

## 2016-06-04 ENCOUNTER — Telehealth: Payer: Self-pay | Admitting: *Deleted

## 2016-06-04 NOTE — Telephone Encounter (Signed)
"  I have an appointment tomorrow with Julie [name].  This wasn't explained to me. Who is she?  Where is she located?  What do I need to do to prepare for this appointment?"  Provided Julie [name] location first floor office visit for Survivorship visit to discuss her current and future care plan.

## 2016-06-05 ENCOUNTER — Ambulatory Visit (HOSPITAL_BASED_OUTPATIENT_CLINIC_OR_DEPARTMENT_OTHER): Payer: Medicare Other | Admitting: Adult Health

## 2016-06-05 ENCOUNTER — Encounter: Payer: Self-pay | Admitting: Adult Health

## 2016-06-05 VITALS — BP 161/67 | HR 65 | Temp 97.7°F | Resp 18 | Ht 63.0 in | Wt 210.4 lb

## 2016-06-05 DIAGNOSIS — K5903 Drug induced constipation: Secondary | ICD-10-CM

## 2016-06-05 DIAGNOSIS — Z171 Estrogen receptor negative status [ER-]: Secondary | ICD-10-CM | POA: Diagnosis not present

## 2016-06-05 DIAGNOSIS — C50412 Malignant neoplasm of upper-outer quadrant of left female breast: Secondary | ICD-10-CM | POA: Diagnosis not present

## 2016-06-05 DIAGNOSIS — T402X5A Adverse effect of other opioids, initial encounter: Secondary | ICD-10-CM

## 2016-06-05 DIAGNOSIS — C50411 Malignant neoplasm of upper-outer quadrant of right female breast: Secondary | ICD-10-CM

## 2016-06-05 DIAGNOSIS — G620 Drug-induced polyneuropathy: Secondary | ICD-10-CM

## 2016-06-05 MED ORDER — POLYETHYLENE GLYCOL 3350 17 G PO PACK: 17.0000 g | PACK | Freq: Every day | ORAL | 11 refills | Status: DC

## 2016-06-05 NOTE — Progress Notes (Signed)
CLINIC:  Survivorship   REASON FOR VISIT:  Routine follow-up post-treatment for a recent history of breast cancer.  BRIEF ONCOLOGIC HISTORY:  Oncology History   Malignant neoplasm of upper outer quadrant of female breast [facility])   Staging form: Breast, AJCC 7th Edition     Pathologic stage from 11/14/2015: Stage IA (T1c, N0, cM0) - Signed by Truitt Merle, MD on 11/25/2015     Clinical stage from 11/20/2015: Stage IA (T1c, N0, M0) - Signed by Curt Bears, MD on 11/20/2015       Malignant neoplasm of upper outer quadrant of female breast ([place])   10/09/2015 Mammogram    Screening bilateral mammograms showed a possible left breast mass. Diagnostic mammogram and ultrasound showed a 7 x 7 x 7 mm mass in the left breast at 11:00 position.      10/22/2015 Receptors her2    ER negative, PR negative, HER-2 not amplified      10/22/2015 Initial Biopsy    Left breast needle core biopsy at the 1:00 position showed invasive ductal carcinoma.      10/22/2015 Initial Diagnosis    Malignant neoplasm of upper outer quadrant of female breast ([place])      11/14/2015 Surgery    Left breast lumpectomy and sentinel lymph node biopsy [name])      11/14/2015 Pathology Results    Left breast lumpectomy showed invasive ductal carcinoma, grade 3, 1.1 cm, resection margins were negative, sentinel lymph node biopsy showed benign breast parenchyma, lymph node tissue not identified. Repeated ER PR and HER-2 were all negative.      12/20/2015 - 03/13/2016 Chemotherapy    Weekly Taxol 80 mg/m x 2 cycles, then changed to Abraxane 100 mg/m for cycles 3-12 due to infusion reaction. Abraxane dose-reduced cycle #9, then again for cycles #11 & #12 due to neuropathy. [total 12 cycles chemo]       04/01/2016 - 05/11/2016 Radiation Therapy    Adjuvant breast radiation ([place]). Left breast: 50.4 Gy in 28 fractions       INTERVAL HISTORY:  Julie [name] presents to the [facility] today for our initial  meeting to review her survivorship care plan detailing her treatment course for breast cancer, as well as monitoring long-term side effects of that treatment, education regarding health maintenance, screening, and overall wellness and health promotion.     Overall, Julie [name] reports feeling quite well since completing her radiation therapy approximately 1 month ago.  She tells me her skin is healing well; she is continuing to use the Radioplex lotion she received from [place].    She continues to struggle with peripheral neuropathy to her hands and feet. She is unable to wear shoes that rub against her toes; she has to wear sandals with socks. The neuropathy in her hands is very painful at times.  She is taking Lyrica 75 mg currently, but tells me she does have an appointment with a neurologist on Monday, 06/08/16 for further evaluation.   She has arthritis pain in her left hip. She sees a rheumatologist for this. Julie [name] tells me she recently had an X-ray, which showed a curvature in her spine which is causing more degeneration in her hip joint.  Her rheumatologist recommends joint injections, but Julie [name] is not interested in this option.  She is able to control the pain with Tramadol, rest, and heating pad.   She did have an ED visit on 05/12/16 for fecal impaction.  She tells me she does  struggle with constipation. She has taken Miralax in the past, but does not take it currently. "I drink a special tea that helps me go and sometimes I eat prunes to help me too."     REVIEW OF SYSTEMS:  Review of Systems  Constitutional: Negative.   HENT:  Negative.   Eyes: Negative.   Respiratory: Negative.   Cardiovascular: Negative.   Gastrointestinal: Positive for constipation.  Endocrine: Negative.   Genitourinary: Negative.    Musculoskeletal: Positive for arthralgias.  Neurological:       (+) peripheral neuropathy to hands and feet  Hematological: Negative.   Psychiatric/Behavioral:  Negative.   Breast: Denies any new nodularity, masses, nipple changes, or nipple discharge. Intermittent left breast tenderness.    A 14-point review of systems was completed and was negative, except as noted above.   ONCOLOGY TREATMENT TEAM:  1. Surgeon:  Dr. Dwain Sarna at [facility] 2. Medical Oncologist: Dr. Mosetta Putt 3. Radiation Oncologist: Dr. Roselind Messier    PAST MEDICAL/SURGICAL HISTORY:  Past Medical History:  Diagnosis Date  . Anemia   . Anxiety    takes Klonopin at bedtime  . Arthritis    takes Methotrexate weekly and Prednisone  . Blood transfusion    no abnormal reaction noted  . Cataracts, bilateral    immature  . Complication of anesthesia    wakes up during surgery  . Depression   . Diabetes mellitus without complication (HCC)   . Dyspnea    with exertion since chemo   . Fibromyalgia    takes Lyrica daily  . GERD (gastroesophageal reflux disease)    takes Nexium daily  . Glaucoma   . Hard of hearing   . Heart murmur   . Histoplasmosis   . History of colon polyps   . History of gout   . History of hiatal hernia   . History of shingles   . Hyperlipidemia   . Hypertension    takes Amlodipine,Metoprolol,and Losartan daily  . Incontinence of bowel   . Interstitial cystitis   . Joint pain   . Nocturia   . Peripheral neuropathy (HCC)   . PONV (postoperative nausea and vomiting)   . Ulcer (HCC) 1970   duodenal  . Urinary frequency   . Urinary urgency   . Weakness    numbness in both hands and feet   Past Surgical History:  Procedure Laterality Date  . ABDOMINAL HYSTERECTOMY    . accupuncture  3 yrs ago  . ANKLE FUSION Left 05/10/2014   Procedure: LEFT ANKLE ARTHRODESIS ;  Surgeon: Toni Arthurs, MD;  Location: [facility] OR;  Service: Orthopedics;  Laterality: Left;  . ANKLE SURGERY Left   . arm surgery Left    radius  . bladder tacked     . CERVICAL FUSION    . CHOLECYSTECTOMY    . COLONOSCOPY    . CYSTOCELE REPAIR    . DILATION AND CURETTAGE  OF UTERUS    . ESOPHAGOGASTRODUODENOSCOPY    . PORT A CATH REVISION N/A 12/27/2015   Procedure: PORT A CATH REVISION;  Surgeon: Emelia Loron, MD;  Location: [facility];  Service: General;  Laterality: N/A;  . PORT-A-CATH REMOVAL N/A 03/25/2016   Procedure: REMOVAL PORT-A-CATH;  Surgeon: Emelia Loron, MD;  Location: [facility];  Service: General;  Laterality: N/A;  . PORTACATH PLACEMENT Right 12/27/2015   Procedure: INSERTION PORT-A-CATH WITH ULTRASOUND ;  Surgeon: Emelia Loron, MD;  Location: [facility];  Service: General;  Laterality:  Right;  [name] RADIOACTIVE SEED GUIDED MASTECTOMY WITH AXILLARY SENTINEL LYMPH NODE BIOPSY Left 11/14/2015   Procedure: RADIOACTIVE SEED GUIDED LEFT BREAST LUMPECTOMY WITH AXILLARY SENTINEL LYMPH NODE BIOPSY;  Surgeon: Rolm Bookbinder, MD;  Location: [facility];  Service: General;  Laterality: Left;  RADIOACTIVE SEED GUIDED LEFT BREAST LUMPECTOMY WITH AXILLARY SENTINEL LYMPH NODE BIOPSY  . SALPINGOOPHORECTOMY Bilateral   . TOTAL KNEE ARTHROPLASTY Right 02/28/2015   Procedure: RIGHT TOTAL KNEE ARTHROPLASTY;  Surgeon: Rod Can, MD;  Location: [facility];  Service: Orthopedics;  Laterality: Right;  . UPPER GASTROINTESTINAL ENDOSCOPY    . VAGINAL HYSTERECTOMY       ALLERGIES:  Allergies  Allergen Reactions  . Codeine Shortness Of Breath  . Other     Walnuts-anaphylaxis (patient denies allergy states that she just does not eat WALNUTS)  . Aspirin Other (See Comments)    Duodenal ulcer- cannot take aspirin when active... Does well with 81 mg. HYPERSENSITIVE  TO  REGULAR DOSE.       CURRENT MEDICATIONS:  Outpatient Encounter Prescriptions as of 06/05/2016  Medication Sig Note  . acetaminophen (TYLENOL) 500 MG tablet Take 500 mg by mouth every 6 (six) hours as needed (for pain/fever.).    [name] amLODipine (NORVASC) 10 MG tablet Take 10 mg by mouth every morning.    . Biotin 10000 MCG TABS Take 1 tablet by mouth  daily.   . calcium-vitamin D (OSCAL WITH D) 500-200 MG-UNIT per tablet Take 1 tablet by mouth every morning.   . ezetimibe (ZETIA) 10 MG tablet Take 10 mg by mouth daily.    . fish oil-omega-3 fatty acids 1000 MG capsule Take 1 g by mouth every morning.    . folic acid (FOLVITE) 1 MG tablet Take 2 mg by mouth every morning.    . hyaluronate sodium (RADIAPLEXRX) GEL Apply 1 application topically 2 (two) times daily. 05/11/2016: Refill given 05/11/16  . meloxicam (MOBIC) 15 MG tablet Take 15 mg by mouth daily.   . metFORMIN (GLUCOPHAGE) 500 MG tablet Take 500 mg by mouth daily.    . metoprolol succinate (TOPROL-XL) 50 MG 24 hr tablet TAKE 1 TABLET AT BEDTIME ONCE A DAY ORALLY 90 DAYS   . pantoprazole (PROTONIX) 40 MG tablet Take 1 tablet (40 mg total) by mouth daily.   . pregabalin (LYRICA) 75 MG capsule Take 75 mg by mouth at bedtime.    . traMADol (ULTRAM) 50 MG tablet TAKE 1 TABLET BY MOUTH EVERY 6-8 HOURS AS NEEDED X 30 DAYS   . valsartan-hydrochlorothiazide (DIOVAN-HCT) 320-12.5 MG per tablet Take 1 tablet by mouth daily. 02/14/2015: Takes with amlodipine   . vitamin B-12 (CYANOCOBALAMIN) 1000 MCG tablet Take 1,000 mcg by mouth daily.   . vitamin E 400 UNIT capsule Take 400 Units by mouth daily.   . hydroxypropyl methylcellulose / hypromellose (ISOPTO TEARS / GONIOVISC) 2.5 % ophthalmic solution Place 1 drop into both eyes daily as needed for dry eyes.   . polyethylene glycol (MIRALAX) packet Take 17 g by mouth daily.   . [DISCONTINUED] clonazePAM (KLONOPIN) 0.5 MG tablet Take 0.5 mg by mouth at bedtime.   . [DISCONTINUED] gabapentin (NEURONTIN) 100 MG capsule TAKE ONE CAPSULE 3 TIMES A DAY (Patient not taking: Reported on 05/11/2016)   . [DISCONTINUED] non-metallic deodorant (ALRA) MISC Apply 1 application topically daily as needed.   . [DISCONTINUED] ondansetron (ZOFRAN) 8 MG tablet Take 1 tablet (8 mg total) by mouth 2 (two) times daily as needed (Nausea or vomiting).   . [  DISCONTINUED]  polyethylene glycol powder (MIRALAX) powder TAKE 6 CAPFULS OF MIRALAX IN A 32 OUNCE GATORADE AND DRINK THE WHOLE BEVERAGE FOLLOWED BY 3 CAPFULS TWICE A DAY FOR THE NEXT WEEK AND FOLLOW UP WITH YOUR PRIMARY CARE PHYSICIAN.   . [DISCONTINUED] prochlorperazine (COMPAZINE) 10 MG tablet Take 1 tablet (10 mg total) by mouth every 6 (six) hours as needed (Nausea or vomiting).    No facility-administered encounter medications on file as of 06/05/2016.      ONCOLOGIC FAMILY HISTORY:  Family History  Problem Relation Age of Onset  . Colon cancer Sister 79  . Esophageal cancer Brother 36  . Breast cancer Mother   . Brain cancer Other      GENETIC COUNSELING/TESTING: None.   SOCIAL HISTORY:  Julie [name] lives in [place], [name]; her youngest daughter lives with her.  She has 5 children; 2 sons and 3 daughters. She has 3 grandchildren.  Ms. Zaro is retired. She denies any current tobacco, alcohol, or illicit drug use.    PHYSICAL EXAMINATION:  Vital Signs:   Vitals:   06/05/16 1419  BP: (!) 161/67  Pulse: 65  Resp: 18  Temp: 97.7 F (36.5 C)   Filed Weights   06/05/16 1419  Weight: 210 lb 6.4 oz (95.4 kg)   General: Well-nourished, well-appearing female in no acute distress.  She is accompanied by her daughter today.   HEENT: Head is normocephalic.  Pupils equal and reactive to light. Conjunctivae clear without exudate.  Sclerae anicteric. Oral mucosa is pink, moist.  Oropharynx is pink without lesions or erythema.  Lymph: No cervical, supraclavicular, or infraclavicular lymphadenopathy noted on palpation.  Cardiovascular: Regular rate and rhythm. Respiratory: Clear to auscultation bilaterally. Chest expansion symmetric; breathing non-labored.  GI: Abdomen soft and round; non-tender, non-distended. Bowel sounds normoactive.  GU: Deferred.  Neuro: No focal deficits. Steady gait.  Psych: Mood and affect normal and appropriate for situation.  Extremities: No edema. Skin: Warm  and dry.  LABORATORY DATA:  None for this visit.  DIAGNOSTIC IMAGING:  None for this visit.      ASSESSMENT AND PLAN:  Ms.. [name] is a pleasant 83 y.o. female with Stage IA left breast invasive ductal carcinoma, ER-/PR-/HER2-, diagnosed in 09/2015;  treated with lumpectomy, adjuvant chemotherapy with Taxol x 2 (stopped after 2nd cycle d/t infusion reaction), then Abraxane x 9 cycles (cycles #9-12 dose-reduced several times d/t peripheral neuropathy).  She went on to complete adjuvant radiation therapy on 05/11/16.  She presents to the Survivorship Clinic for our initial meeting and routine follow-up post-completion of treatment for breast cancer.    1. Stage IA left breast cancer:  Julie [name] is continuing to recover from definitive treatment for breast cancer. She will follow-up with her medical oncologist, Dr. Burr Medico in 06/2016 with history and physical exam per surveillance protocol.  Today, a comprehensive survivorship care plan and treatment summary was reviewed with the patient today detailing her breast cancer diagnosis, treatment course, potential late/long-term effects of treatment, appropriate follow-up care with recommendations for the future, and patient education resources.  A copy of this summary, along with a letter will be sent to the patient's primary care provider via mail/fax/In Basket message after today's visit.    2. Peripheral neuropathy likely secondary to taxane chemotherapy: Peripheral neuropathy is a very common side effect of taxane chemotherapeutic agents like Taxol and Abraxane. She is currently taking Lyrica 75 mg, which has not been optimal.  She was started on gabapentin in the past,  but she reports she is not taking it now "and I don't know if it ever really helped either."  Fortunately, she is scheduled to see a neurology specialist on Monday, 06/08/16. We discussed a referral to physical therapy can also be helpful in managing peripheral neuropathy, but she prefers  to wait to see what the neurologist recommends on Monday.   3. Constipation secondary to opiate therapy: Julie [name] had recent ED visit for fecal impaction. I explained the importance of adequate bowel regimen when taking opiate medications like Tramadol.  She tells me Miralax was helpful in the past and requests a prescription for this medication as it is cheaper for her with her insurance.  Therefore, I e-prescribed Miralax 17 gm once daily to her pharmacy. She understands she can use the Miralax as needed, with the goal of having a bowel movement at a minimum of every other day while taking opiates.   4. Bone health:  Given Julie [name]'s age & history of breast cancer, she is at risk for bone demineralization.  We do not have any DEXA scan records available for review.  Given that hswe had triple negative breast cancer and will not require adjuvant anti-estrogen therapy that could decrease bone density, I will defer any future DEXA imaging to her PCP or medical oncologist, as clinically indicated.   5. Cancer screening:  Due to Julie [name]'s history and her age, she should receive screening for skin cancers .  The information and recommendations are listed on the patient's comprehensive care plan/treatment summary and were reviewed in detail with the patient.    6. Health maintenance and wellness promotion: Julie [name] was encouraged to consume 5-7 servings of fruits and vegetables per day. We reviewed the "Nutrition Rainbow" handout, as well as the handout "Take Control of Your Health and Reduce Your Cancer Risk" from the [employer].  She was also encouraged to engage in moderate to vigorous exercise for 30 minutes per day most days of the week. We discussed the [employer] fitness program, which is designed for cancer survivors to help them become more physically fit after cancer treatments.  She was instructed to limit her alcohol consumption and continue to abstain from tobacco use.      7. Support services/counseling: It is not uncommon for this period of the patient's cancer care trajectory to be one of many emotions and stressors.  We discussed an opportunity for her to participate in the next session of [facility] ("Finding Your New Normal") support group series designed for patients after they have completed treatment.   Julie [name] was encouraged to take advantage of our many other support services programs, support groups, and/or counseling in coping with her new life as a cancer survivor after completing anti-cancer treatment.  She was offered support today through active listening and expressive supportive counseling.  She was given information regarding our available services and encouraged to contact me with any questions or for help enrolling in any of our support group/programs.    Dispo:   -Return to cancer center to see Dr. Mosetta Putt in 06/2016.  -She is welcome to return back to the Survivorship Clinic at any time; no additional follow-up needed at this time.  -Consider referral back to survivorship as a long-term survivor for continued surveillance   A total of 55 minutes of face-to-face time was spent with this patient with greater than 50% of that time in counseling and care-coordination.   Lubertha Basque, NP Survivorship Program [facility]  [place]   Note: PRIMARY CARE PROVIDER Julie Gravel, MD [phone]

## 2016-06-08 ENCOUNTER — Ambulatory Visit (INDEPENDENT_AMBULATORY_CARE_PROVIDER_SITE_OTHER): Payer: Medicare Other | Admitting: Neurology

## 2016-06-08 ENCOUNTER — Encounter: Payer: Self-pay | Admitting: Neurology

## 2016-06-08 ENCOUNTER — Other Ambulatory Visit: Payer: Self-pay | Admitting: *Deleted

## 2016-06-08 VITALS — BP 138/63 | HR 55 | Ht 63.0 in | Wt 208.2 lb

## 2016-06-08 DIAGNOSIS — R269 Unspecified abnormalities of gait and mobility: Secondary | ICD-10-CM

## 2016-06-08 DIAGNOSIS — R799 Abnormal finding of blood chemistry, unspecified: Secondary | ICD-10-CM | POA: Diagnosis not present

## 2016-06-08 DIAGNOSIS — G620 Drug-induced polyneuropathy: Secondary | ICD-10-CM | POA: Diagnosis not present

## 2016-06-08 DIAGNOSIS — R8290 Unspecified abnormal findings in urine: Secondary | ICD-10-CM | POA: Diagnosis not present

## 2016-06-08 DIAGNOSIS — G629 Polyneuropathy, unspecified: Secondary | ICD-10-CM | POA: Insufficient documentation

## 2016-06-08 DIAGNOSIS — M545 Low back pain, unspecified: Secondary | ICD-10-CM | POA: Insufficient documentation

## 2016-06-08 DIAGNOSIS — R52 Pain, unspecified: Secondary | ICD-10-CM | POA: Diagnosis not present

## 2016-06-08 MED ORDER — PREGABALIN 75 MG PO CAPS: 75.0000 mg | ORAL_CAPSULE | Freq: Three times a day (TID) | ORAL | 5 refills | Status: DC
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
## 2016-06-09 ENCOUNTER — Telehealth: Payer: Self-pay | Admitting: Neurology

## 2016-06-09 LAB — URINALYSIS
Bilirubin, UA: NEGATIVE
Glucose, UA: NEGATIVE
Ketones, UA: NEGATIVE
Nitrite, UA: NEGATIVE
PH UA: 6.5 (ref 5.0–7.5)
Protein, UA: NEGATIVE
RBC, UA: NEGATIVE
Specific Gravity, UA: 1.013 (ref 1.005–1.030)
UUROB: 0.2 mg/dL (ref 0.2–1.0)

## 2016-06-09 NOTE — Telephone Encounter (Signed)
Spoke to patient's daughter on HIPAA - they are aware of results.

## 2016-06-09 NOTE — Telephone Encounter (Signed)
Please call patient, lab showed mildly elevated A1c 5.8, rest of the test were normal.

## 2016-06-10 DIAGNOSIS — H2512 Age-related nuclear cataract, left eye: Secondary | ICD-10-CM | POA: Diagnosis not present

## 2016-06-10 DIAGNOSIS — H2511 Age-related nuclear cataract, right eye: Secondary | ICD-10-CM | POA: Diagnosis not present

## 2016-06-10 LAB — SEDIMENTATION RATE: Sed Rate: 22 mm/hr (ref 0–40)

## 2016-06-10 LAB — HEMOGLOBIN A1C
Est. average glucose Bld gHb Est-mCnc: 120 mg/dL
Hgb A1c MFr Bld: 5.8 % — ABNORMAL HIGH (ref 4.8–5.6)

## 2016-06-10 LAB — COPPER, SERUM: COPPER: 97 ug/dL (ref 72–166)

## 2016-06-10 LAB — THYROID PANEL WITH TSH
FREE THYROXINE INDEX: 1.7 (ref 1.2–4.9)
T3 Uptake Ratio: 30 % (ref 24–39)
T4 TOTAL: 5.6 ug/dL (ref 4.5–12.0)
TSH: 0.962 u[IU]/mL (ref 0.450–4.500)

## 2016-06-10 LAB — VITAMIN B12: VITAMIN B 12: 1507 pg/mL — AB (ref 232–1245)

## 2016-06-10 LAB — C-REACTIVE PROTEIN: CRP: 0.8 mg/L (ref 0.0–4.9)

## 2016-06-10 LAB — ANA W/REFLEX: Anti Nuclear Antibody(ANA): NEGATIVE

## 2016-06-12 ENCOUNTER — Telehealth: Payer: Self-pay | Admitting: Neurology

## 2016-06-12 NOTE — Telephone Encounter (Signed)
Please call patient laboratory evaluation showed mild elevated A1c 5.8, rest of the laboratory evaluations were normal.

## 2016-06-12 NOTE — Telephone Encounter (Signed)
Spoke to patient's daughter on HIPAA - they are aware of results.

## 2016-06-15 ENCOUNTER — Other Ambulatory Visit: Payer: Self-pay | Admitting: Hematology

## 2016-06-15 DIAGNOSIS — C50411 Malignant neoplasm of upper-outer quadrant of right female breast: Secondary | ICD-10-CM

## 2016-06-15 DIAGNOSIS — Z170 Estrogen receptor positive status [ER+]: Principal | ICD-10-CM

## 2016-06-16 DIAGNOSIS — E119 Type 2 diabetes mellitus without complications: Secondary | ICD-10-CM | POA: Diagnosis not present

## 2016-06-16 DIAGNOSIS — E7800 Pure hypercholesterolemia, unspecified: Secondary | ICD-10-CM | POA: Diagnosis not present

## 2016-06-16 DIAGNOSIS — I10 Essential (primary) hypertension: Secondary | ICD-10-CM | POA: Diagnosis not present

## 2016-06-18 DIAGNOSIS — E7800 Pure hypercholesterolemia, unspecified: Secondary | ICD-10-CM | POA: Diagnosis not present

## 2016-06-18 DIAGNOSIS — E119 Type 2 diabetes mellitus without complications: Secondary | ICD-10-CM | POA: Diagnosis not present

## 2016-06-18 DIAGNOSIS — E559 Vitamin D deficiency, unspecified: Secondary | ICD-10-CM | POA: Diagnosis not present

## 2016-06-18 DIAGNOSIS — Z0000 Encounter for general adult medical examination without abnormal findings: Secondary | ICD-10-CM | POA: Diagnosis not present

## 2016-06-18 DIAGNOSIS — I10 Essential (primary) hypertension: Secondary | ICD-10-CM | POA: Diagnosis not present

## 2016-06-19 ENCOUNTER — Ambulatory Visit
Admission: RE | Admit: 2016-06-19 | Discharge: 2016-06-19 | Disposition: A | Discharge status: Home / Self Care | Payer: Medicare Other | Source: Ambulatory Visit | Attending: Neurology | Admitting: Neurology

## 2016-06-19 DIAGNOSIS — M545 Low back pain, unspecified: Secondary | ICD-10-CM

## 2016-06-19 DIAGNOSIS — R269 Unspecified abnormalities of gait and mobility: Secondary | ICD-10-CM

## 2016-06-19 DIAGNOSIS — G620 Drug-induced polyneuropathy: Secondary | ICD-10-CM

## 2016-06-19 DIAGNOSIS — M48061 Spinal stenosis, lumbar region without neurogenic claudication: Secondary | ICD-10-CM | POA: Diagnosis not present

## 2016-06-25 ENCOUNTER — Ambulatory Visit
Admission: RE | Admit: 2016-06-25 | Discharge: 2016-06-25 | Disposition: A | Discharge status: Home / Self Care | Payer: Medicare Other | Source: Ambulatory Visit | Attending: Radiation Oncology | Admitting: Radiation Oncology

## 2016-06-25 ENCOUNTER — Encounter: Payer: Self-pay | Admitting: Radiation Oncology

## 2016-06-25 DIAGNOSIS — Z885 Allergy status to narcotic agent status: Secondary | ICD-10-CM | POA: Insufficient documentation

## 2016-06-25 DIAGNOSIS — Z923 Personal history of irradiation: Secondary | ICD-10-CM | POA: Diagnosis not present

## 2016-06-25 DIAGNOSIS — Z171 Estrogen receptor negative status [ER-]: Secondary | ICD-10-CM | POA: Insufficient documentation

## 2016-06-25 DIAGNOSIS — Z886 Allergy status to analgesic agent status: Secondary | ICD-10-CM | POA: Diagnosis not present

## 2016-06-25 DIAGNOSIS — C50411 Malignant neoplasm of upper-outer quadrant of right female breast: Secondary | ICD-10-CM | POA: Insufficient documentation

## 2016-06-25 DIAGNOSIS — Z888 Allergy status to other drugs, medicaments and biological substances status: Secondary | ICD-10-CM | POA: Diagnosis not present

## 2016-06-25 NOTE — Progress Notes (Addendum)
Radiation Oncology         [phone] ________________________________  Name: Julie [name] MRN: [id number]  Date: 06/25/2016  DOB: [birth year]  Follow-Up Visit Note  CC: [name], MD  Rolm Bookbinder, MD    Diagnosis: Stage IA (pT1c, pN0) grade 3 invasive ductal carcinoma of the UOQ of the left breast (triple negative)     Interval Since Last Radiation:  6 weeks 04/01/16-05/11/16 50.4 Gy to the left breast in 28 fractions  Narrative:  The patient returns today for routine follow-up. She reports pain in her left hip due to arthritis. She also notes a sharp pain in her left breast.  She also notes fatigue. Per nursing, the skin on her left breast is intact with slight hyperpigmentation.                             ALLERGIES:  is allergic to codeine; other; and aspirin.  Meds: Current Outpatient Prescriptions  Medication Sig Dispense Refill  . acetaminophen (TYLENOL) 500 MG tablet Take 500 mg by mouth every 6 (six) hours as needed (for pain/fever.).     [name] amLODipine (NORVASC) 10 MG tablet Take 10 mg by mouth every morning.     . Biotin 10000 MCG TABS Take 1 tablet by mouth daily.    . calcium-vitamin D (OSCAL WITH D) 500-200 MG-UNIT per tablet Take 1 tablet by mouth every morning.    . ezetimibe (ZETIA) 10 MG tablet Take 10 mg by mouth daily.     . fish oil-omega-3 fatty acids 1000 MG capsule Take 1 g by mouth every morning.     . folic acid (FOLVITE) 1 MG tablet Take 2 mg by mouth every morning.     . hyaluronate sodium (RADIAPLEXRX) GEL Apply 1 application topically 2 (two) times daily.    . hydroxypropyl methylcellulose / hypromellose (ISOPTO TEARS / GONIOVISC) 2.5 % ophthalmic solution Place 1 drop into both eyes daily as needed for dry eyes.    [name] linaclotide (LINZESS) 145 MCG CAPS capsule Take 145 mcg by mouth daily before breakfast.    . meloxicam (MOBIC) 15 MG tablet Take 15 mg by mouth daily.  11  . metFORMIN (GLUCOPHAGE) 500 MG tablet Take 500 mg by mouth daily.       . metoprolol succinate (TOPROL-XL) 50 MG 24 hr tablet TAKE 1 TABLET AT BEDTIME ONCE A DAY ORALLY 90 DAYS  2  . pantoprazole (PROTONIX) 40 MG tablet Take 1 tablet (40 mg total) by mouth daily. 90 tablet 0  . polyethylene glycol (MIRALAX) packet Take 17 g by mouth daily. 30 each 11  . pregabalin (LYRICA) 75 MG capsule Take 1 capsule (75 mg total) by mouth 3 (three) times daily. 90 capsule 5  . traMADol (ULTRAM) 50 MG tablet TAKE 1 TABLET BY MOUTH EVERY 6 TO 8 HOURS AS NEEDED FOR 30 DAYS 60 tablet 0  . valsartan-hydrochlorothiazide (DIOVAN-HCT) 320-12.5 MG per tablet Take 1 tablet by mouth daily.    . vitamin B-12 (CYANOCOBALAMIN) 1000 MCG tablet Take 1,000 mcg by mouth daily.    . vitamin E 400 UNIT capsule Take 400 Units by mouth daily.     No current facility-administered medications for this encounter.     Physical Findings: The patient is in no acute distress. Patient is alert and oriented.  height is 5\' 3"  (1.6 m) and weight is 210 lb 3.2 oz (95.3 kg). Her oral temperature is 98.8 F (37.1  C). Her blood pressure is 139/73 and her pulse is 57 (abnormal). Her oxygen saturation is 100%. .  No significant changes. Lungs are clear to auscultation bilaterally. Heart has regular rate and rhythm. No palpable cervical, supraclavicular, or axillary adenopathy. Abdomen soft, non-tender, normal bowel sounds. Right breast no palpable masses or nipple discharge. Left breast has some mild hyperpigmentation and skin is well healed. There are no dominant masses, nipple discharge, or bleeding in the left breast.  Lab Findings: Lab Results  Component Value Date   WBC 5.2 05/05/2016   HGB 11.6 05/05/2016   HCT 36.6 05/05/2016   MCV 84.7 05/05/2016   PLT 244 05/05/2016    Radiographic Findings: Mr Lumbar Spine Wo Contrast  Result Date: 06/19/2016 CLINICAL DATA:  83 y/o F; lower back and left buttocks pain for 6 months. EXAM: MRI LUMBAR SPINE WITHOUT CONTRAST TECHNIQUE: Multiplanar, multisequence MR  imaging of the lumbar spine was performed. No intravenous contrast was administered. COMPARISON:  03/27/2016 lumbar radiographs.  08/27/2008 lumbar MRI. FINDINGS: Segmentation:  Standard.  Rudimentary S1-2 disc. Alignment:  Physiologic. Vertebrae: No fracture, evidence of discitis, or bone lesion. Left-greater-than-right generated facet effusions at L5-S1. Mild degenerative endplate edema at [id number]. Conus medullaris: Extends to the L2 level and appears normal. Paraspinal and other soft tissues: Parapelvic renal cysts bilaterally. Disc levels: L1-2: Small disc bulge with mild facet and ligamentum flavum hypertrophy greater on the left. Mild foraminal narrowing bilaterally. No significant canal stenosis. Left ligamentum flavum hypertrophy effaces the left posterior thecal sac. L2-3: Disc bulge with moderate facet and ligamentum flavum hypertrophy. Small central superiorly directed disc extrusion measuring 11 mm craniocaudal. Moderate bilateral foraminal narrowing and mild-to-moderate canal stenosis with crowding of cauda equina. L3-4: Moderate disc bulge with moderate facet and ligamentum flavum hypertrophy greater on the right. Moderate to severe right and moderate left foraminal narrowing. Moderate canal stenosis. L4-5: Moderate disc bulge eccentric to the right, right greater than left advanced facet, and moderate ligamentum flavum hypertrophy. Mild left and severe right foraminal narrowing. Mild canal stenosis. L5-S1: Moderate disc bulge with advanced facet and moderate ligamentum flavum hypertrophy. Moderate bilateral foraminal narrowing, narrowing of lateral recesses, no significant canal stenosis. IMPRESSION: 1. Diffuse lumbar spondylosis with discogenic and facet degenerative changes progressed from 2010 lumbar MRI. 2. Multilevel canal stenosis is mild-to-moderate at L2-3, moderate L3-4, and mild at L4-5. 3. Multiple levels of mild and moderate neural foraminal narrowing with severe foraminal narrowing at the  right L3-4 and L4-5 levels. Electronically Signed   By: Kristine Garbe M.D.   On: 06/19/2016 14:04    Impression:  The patient is recovering from the effects of radiation. No evidence of recurrence on clinical exam today. Patient is doing well clinically.   Plan:  Follow up with radiation oncology prn. Follow up with Dr. Burr Medico on 07/15/16. The patient will continue to follow up with medical oncology and surgery.  ____________________________________    This document serves as a record of services personally performed by Gery Pray, MD. It was created on his behalf by Bethann Humble, a trained medical scribe. The creation of this record is based on the scribe's personal observations and the provider's statements to them. This document has been checked and approved by the attending provider.

## 2016-06-25 NOTE — Progress Notes (Addendum)
Julie [name] is here for follow up after treatment to her left breast.  She reports having pain in her left hip due to arthritis.  She reports having some fatigue.  The skin on her left breast is intact with slight hyperpigmentation.  Her bp was elevated at 152/99 on arrival and 139/73 when rechecked.  BP (!) 152/99 (BP Location: Right Arm, Patient Position: Sitting)   Pulse (!) 57   Temp 98.8 F (37.1 C) (Oral)   Ht 5\' 3"  (1.6 m)   Wt 210 lb 3.2 oz (95.3 kg)   SpO2 100%   BMI 37.24 kg/m    Wt Readings from Last 3 Encounters:  06/25/16 210 lb 3.2 oz (95.3 kg)  06/08/16 208 lb 4 oz (94.5 kg)  06/05/16 210 lb 6.4 oz (95.4 kg)

## 2016-06-29 DIAGNOSIS — Z79899 Other long term (current) drug therapy: Secondary | ICD-10-CM | POA: Diagnosis not present

## 2016-06-29 DIAGNOSIS — M0579 Rheumatoid arthritis with rheumatoid factor of multiple sites without organ or systems involvement: Secondary | ICD-10-CM | POA: Diagnosis not present

## 2016-06-29 DIAGNOSIS — M549 Dorsalgia, unspecified: Secondary | ICD-10-CM | POA: Diagnosis not present

## 2016-06-29 DIAGNOSIS — M6281 Muscle weakness (generalized): Secondary | ICD-10-CM | POA: Diagnosis not present

## 2016-07-02 DIAGNOSIS — C50412 Malignant neoplasm of upper-outer quadrant of left female breast: Secondary | ICD-10-CM | POA: Diagnosis not present

## 2016-07-08 ENCOUNTER — Other Ambulatory Visit (HOSPITAL_BASED_OUTPATIENT_CLINIC_OR_DEPARTMENT_OTHER): Payer: Medicare Other

## 2016-07-08 DIAGNOSIS — D472 Monoclonal gammopathy: Secondary | ICD-10-CM | POA: Diagnosis not present

## 2016-07-08 DIAGNOSIS — C50412 Malignant neoplasm of upper-outer quadrant of left female breast: Secondary | ICD-10-CM | POA: Diagnosis not present

## 2016-07-08 LAB — CBC WITH DIFFERENTIAL/PLATELET
BASO%: 1.3 % (ref 0.0–2.0)
Basophils Absolute: 0.1 10*3/uL (ref 0.0–0.1)
EOS%: 3.8 % (ref 0.0–7.0)
Eosinophils Absolute: 0.2 10*3/uL (ref 0.0–0.5)
HCT: 35.8 % (ref 34.8–46.6)
HGB: 11.6 g/dL (ref 11.6–15.9)
LYMPH%: 22.9 % (ref 14.0–49.7)
MCH: 26.9 pg (ref 25.1–34.0)
MCHC: 32.5 g/dL (ref 31.5–36.0)
MCV: 82.7 fL (ref 79.5–101.0)
MONO#: 0.5 10*3/uL (ref 0.1–0.9)
MONO%: 11.7 % (ref 0.0–14.0)
NEUT%: 60.3 % (ref 38.4–76.8)
NEUTROS ABS: 2.4 10*3/uL (ref 1.5–6.5)
Platelets: 209 10*3/uL (ref 145–400)
RBC: 4.32 10*6/uL (ref 3.70–5.45)
RDW: 14.9 % — ABNORMAL HIGH (ref 11.2–14.5)
WBC: 4 10*3/uL (ref 3.9–10.3)
lymph#: 0.9 10*3/uL (ref 0.9–3.3)

## 2016-07-08 LAB — COMPREHENSIVE METABOLIC PANEL
ALT: 11 U/L (ref 0–55)
AST: 20 U/L (ref 5–34)
Albumin: 3.6 g/dL (ref 3.5–5.0)
Alkaline Phosphatase: 85 U/L (ref 40–150)
Anion Gap: 9 mEq/L (ref 3–11)
BILIRUBIN TOTAL: 0.6 mg/dL (ref 0.20–1.20)
BUN: 17.8 mg/dL (ref 7.0–26.0)
CHLORIDE: 108 meq/L (ref 98–109)
CO2: 27 meq/L (ref 22–29)
CREATININE: 1 mg/dL (ref 0.6–1.1)
Calcium: 9.6 mg/dL (ref 8.4–10.4)
EGFR: 62 mL/min/{1.73_m2} — ABNORMAL LOW (ref >90)
GLUCOSE: 88 mg/dL (ref 70–140)
Potassium: 3.9 mEq/L (ref 3.5–5.1)
SODIUM: 144 meq/L (ref 136–145)
TOTAL PROTEIN: 7 g/dL (ref 6.4–8.3)

## 2016-07-09 LAB — KAPPA/LAMBDA LIGHT CHAINS
Ig Kappa Free Light Chain: 37.3 mg/L — ABNORMAL HIGH (ref 3.3–19.4)
Ig Lambda Free Light Chain: 12.7 mg/L (ref 5.7–26.3)
KAPPA/LAMBDA FLC RATIO: 2.94 — AB (ref 0.26–1.65)

## 2016-07-12 LAB — MULTIPLE MYELOMA PANEL, SERUM
ALBUMIN SERPL ELPH-MCNC: 3.5 g/dL (ref 2.9–4.4)
ALPHA 1: 0.2 g/dL (ref 0.0–0.4)
ALPHA2 GLOB SERPL ELPH-MCNC: 0.5 g/dL (ref 0.4–1.0)
Albumin/Glob SerPl: 1.2 (ref 0.7–1.7)
B-GLOBULIN SERPL ELPH-MCNC: 1.5 g/dL — AB (ref 0.7–1.3)
GAMMA GLOB SERPL ELPH-MCNC: 0.7 g/dL (ref 0.4–1.8)
GLOBULIN, TOTAL: 3 g/dL (ref 2.2–3.9)
IGA/IMMUNOGLOBULIN A, SERUM: 763 mg/dL — AB (ref 64–422)
IGG (IMMUNOGLOBIN G), SERUM: 720 mg/dL (ref 700–1600)
IgM, Qn, Serum: 43 mg/dL (ref 26–217)
M PROTEIN SERPL ELPH-MCNC: 0.8 g/dL — AB
Total Protein: 6.5 g/dL (ref 6.0–8.5)

## 2016-07-14 NOTE — Progress Notes (Signed)
[place]  [phone] [phone]  Clinic follow up Note   Patient Care Team: [name], MD as PCP - General (Internal Medicine) Valinda Party, MD as Referring Physician (Rheumatology) 07/15/2016  CHIEF COMPLAINTS:  Follow up left breast cancer   Oncology History   Malignant neoplasm of upper outer quadrant of female breast [facility])   Staging form: Breast, AJCC 7th Edition     Pathologic stage from 11/14/2015: Stage IA (T1c, N0, cM0) - Signed by Truitt Merle, MD on 11/25/2015     Clinical stage from 11/20/2015: Stage IA (T1c, N0, M0) - Signed by Curt Bears, MD on 11/20/2015       Malignant neoplasm of upper outer quadrant of female breast ([place])   10/09/2015 Mammogram    Screening bilateral mammograms showed a possible left breast mass. Diagnostic mammogram and ultrasound showed a 7 x 7 x 7 mm mass in the left breast at 11:00 position.      10/22/2015 Receptors her2    ER negative, PR negative, HER-2 not amplified      10/22/2015 Initial Biopsy    Left breast needle core biopsy at the 1:00 position showed invasive ductal carcinoma.      10/22/2015 Initial Diagnosis    Malignant neoplasm of upper outer quadrant of female breast ([place])      11/14/2015 Surgery    Left breast lumpectomy and sentinel lymph node biopsy [name])      11/14/2015 Pathology Results    Left breast lumpectomy showed invasive ductal carcinoma, grade 3, 1.1 cm, resection margins were negative, sentinel lymph node biopsy showed benign breast parenchyma, lymph node tissue not identified. Repeated ER PR and HER-2 were all negative.      12/20/2015 - 03/13/2016 Chemotherapy    Weekly Taxol 80 mg/m x 2 cycles, then changed to Abraxane 100 mg/m for cycles 3-12 due to infusion reaction. Abraxane dose-reduced cycle #9, then again for cycles #11 & #12 due to neuropathy. [total 12 cycles chemo]       04/01/2016 - 05/11/2016 Radiation Therapy    Adjuvant breast radiation  ([place]). Left breast: 50.4 Gy in 28 fractions      06/19/2016 Imaging    MR Lumbar Spine without contrast  IMPRESSION: 1. Diffuse lumbar spondylosis with discogenic and facet degenerative changes progressed from 2010 lumbar MRI. 2. Multilevel canal stenosis is mild-to-moderate at L2-3, moderate L3-4, and mild at L4-5. 3. Multiple levels of mild and moderate neural foraminal narrowing with severe foraminal narrowing at the right L3-4 and L4-5 levels.       HISTORY OF PRESENTING ILLNESS:  Julie [name] 83 y.o. female is here because of her recently diagnosed left breast cancer. She has been seen my partner Dr. Julien Nordmann for MGUS, and was referred to me to discuss her adjuvant therapy for breast cancer. She is accompanied by her daughter to the clinic today.  Her breast cancer was discovered by screening mammogram. She has no palpable breast mass, skin change or nipple discharge. She denies any new constitutional symptoms. The diagnostic mammogram and ultrasound showed a 7 mm mass in the left breast, biopsy showed invasive ductal carcinoma, triple negative. She was seen by breast surgeon Dr. [name], and underwent left rest lumpectomy and sentinel lymph node biopsy on 11/14/2015.   She had a multiple orthopedic surgeries in the past, takes tramadol as needed. She lives with her daughter, pretty independent, does not exercise regularly, but it goes out for shopping, playing cards, etc.  GYN  HISTORY  Menarchal: 16 LMP: 38 (hysterectomy) Contraceptive: 2 HRT: 1 yr  G5P5: she did breast feeding to 2 children   CURRENT THERAPY: Surveillance  INTERIM HISTORY: Julie [name] returns for follow-up. The patient reports she is feeling significantly better since completing chemotherapy and radiation. She reports occasional shooting pains in her breast. She reports ongoing "excessive" neuropathy on fingers and toes, with some pain in the tips of her fingers. She describes both numbness and tingling in  these areas. The patient reports she has some balance issues and continues to ambulate with a cane. She is going to the gym twice a week with the goal of gaining strength and ambulating without a cane. Her discomfort does not wake her up at night. She reports she is currently taking B12 Complex. She has stopped taking Tramadol following a recent impaction. The patient reports she previously received acupuncture which relieved her discomfort from osteoarthritis.  MEDICAL HISTORY:  Past Medical History:  Diagnosis Date  . Anemia   . Anxiety    takes Klonopin at bedtime  . Arthritis    takes Methotrexate weekly and Prednisone  . Blood transfusion    no abnormal reaction noted  . Cataracts, bilateral    immature  . Complication of anesthesia    wakes up during surgery  . Depression   . Diabetes mellitus without complication ([place])   . Dyspnea    with exertion since chemo   . Fibromyalgia    takes Lyrica daily  . GERD (gastroesophageal reflux disease)    takes Nexium daily  . Glaucoma   . Hard of hearing   . Heart murmur   . Histoplasmosis   . History of colon polyps   . History of gout   . History of hiatal hernia   . History of shingles   . Hyperlipidemia   . Hypertension    takes Amlodipine,Metoprolol,and Losartan daily  . Incontinence of bowel   . Interstitial cystitis   . Joint pain   . Low back pain   . Nocturia   . Peripheral neuropathy ([place])   . Peripheral neuropathy ([place])   . PONV (postoperative nausea and vomiting)   . Ulcer ([place]) 1970   duodenal  . Urinary frequency   . Urinary urgency   . Weakness    numbness in both hands and feet    SURGICAL HISTORY: Past Surgical History:  Procedure Laterality Date  . ABDOMINAL HYSTERECTOMY    . accupuncture  3 yrs ago  . ANKLE FUSION Left 05/10/2014   Procedure: LEFT ANKLE ARTHRODESIS ;  Surgeon: Wylene Simmer, MD;  Location: [place];  Service: Orthopedics;  Laterality: Left;  . ANKLE SURGERY Left   . arm surgery Left     radius  . bladder tacked     . CERVICAL FUSION    . CHOLECYSTECTOMY    . COLONOSCOPY    . CYSTOCELE REPAIR    . DILATION AND CURETTAGE OF UTERUS    . ESOPHAGOGASTRODUODENOSCOPY    . PORT A CATH REVISION N/A 12/27/2015   Procedure: PORT A CATH REVISION;  Surgeon: Rolm Bookbinder, MD;  Location: [place];  Service: General;  Laterality: N/A;  . PORT-A-CATH REMOVAL N/A 03/25/2016   Procedure: REMOVAL PORT-A-CATH;  Surgeon: Rolm Bookbinder, MD;  Location: [facility];  Service: General;  Laterality: N/A;  . PORTACATH PLACEMENT Right 12/27/2015   Procedure: INSERTION PORT-A-CATH WITH ULTRASOUND ;  Surgeon: Rolm Bookbinder, MD;  Location: [place];  Service: General;  Laterality: Right;  . RADIOACTIVE SEED GUIDED MASTECTOMY WITH AXILLARY SENTINEL LYMPH NODE BIOPSY Left 11/14/2015   Procedure: RADIOACTIVE SEED GUIDED LEFT BREAST LUMPECTOMY WITH AXILLARY SENTINEL LYMPH NODE BIOPSY;  Surgeon: Rolm Bookbinder, MD;  Location: [place];  Service: General;  Laterality: Left;  RADIOACTIVE SEED GUIDED LEFT BREAST LUMPECTOMY WITH AXILLARY SENTINEL LYMPH NODE BIOPSY  . SALPINGOOPHORECTOMY Bilateral   . TOTAL KNEE ARTHROPLASTY Right 02/28/2015   Procedure: RIGHT TOTAL KNEE ARTHROPLASTY;  Surgeon: Rod Can, MD;  Location: [facility];  Service: Orthopedics;  Laterality: Right;  . UPPER GASTROINTESTINAL ENDOSCOPY    . VAGINAL HYSTERECTOMY      SOCIAL HISTORY: Social History   Social History  . Marital status: Divorced    Spouse name: N/A  . Number of children: 5  . Years of education: some college   Occupational History  . Retired    Social History Main Topics  . Smoking status: Never Smoker  . Smokeless tobacco: Never Used  . Alcohol use No     Comment: IN [place] ONLY  . Drug use: No  . Sexual activity: Not on file   Other Topics Concern  . Not on file   Social History Narrative   Lives at home with her daughter.   Right-handed.   0.5  cup caffeine per day.    FAMILY HISTORY: Family History  Problem Relation Age of Onset  . Colon cancer Sister 61  . Esophageal cancer Brother 2  . Breast cancer Mother   . Brain cancer Other     ALLERGIES:  is allergic to codeine; other; and aspirin.  MEDICATIONS:  Current Outpatient Prescriptions  Medication Sig Dispense Refill  . acetaminophen (TYLENOL) 500 MG tablet Take 500 mg by mouth every 6 (six) hours as needed (for pain/fever.).     [name] amLODipine (NORVASC) 10 MG tablet Take 10 mg by mouth every morning.     . Biotin 10000 MCG TABS Take 1 tablet by mouth daily.    . calcium-vitamin D (OSCAL WITH D) 500-200 MG-UNIT per tablet Take 1 tablet by mouth every morning.    . fish oil-omega-3 fatty acids 1000 MG capsule Take 1 g by mouth every morning.     . folic acid (FOLVITE) 1 MG tablet Take 2 mg by mouth every morning.     . hydroxypropyl methylcellulose / hypromellose (ISOPTO TEARS / GONIOVISC) 2.5 % ophthalmic solution Place 1 drop into both eyes daily as needed for dry eyes.    . meloxicam (MOBIC) 15 MG tablet Take 15 mg by mouth daily.  11  . metFORMIN (GLUCOPHAGE) 500 MG tablet Take 500 mg by mouth daily.     . metoprolol succinate (TOPROL-XL) 50 MG 24 hr tablet TAKE 1 TABLET AT BEDTIME ONCE A DAY ORALLY 90 DAYS  2  . pantoprazole (PROTONIX) 40 MG tablet Take 1 tablet (40 mg total) by mouth daily. 90 tablet 0  . pregabalin (LYRICA) 75 MG capsule Take 1 capsule (75 mg total) by mouth 3 (three) times daily. 90 capsule 5  . valsartan-hydrochlorothiazide (DIOVAN-HCT) 320-12.5 MG per tablet Take 1 tablet by mouth daily.    . vitamin B-12 (CYANOCOBALAMIN) 1000 MCG tablet Take 1,000 mcg by mouth daily.    . vitamin E 400 UNIT capsule Take 400 Units by mouth daily.    [name] ezetimibe (ZETIA) 10 MG tablet Take 10 mg by mouth daily.     . hyaluronate sodium (RADIAPLEXRX) GEL Apply 1 application topically 2 (two) times daily.    [name]  linaclotide (LINZESS) 145 MCG CAPS capsule Take 145 mcg by  mouth daily before breakfast.    . polyethylene glycol (MIRALAX) packet Take 17 g by mouth daily. (Patient not taking: Reported on 07/15/2016) 30 each 11  . traMADol (ULTRAM) 50 MG tablet TAKE 1 TABLET BY MOUTH EVERY 6 TO 8 HOURS AS NEEDED FOR 30 DAYS (Patient not taking: Reported on 07/15/2016) 60 tablet 0   No current facility-administered medications for this visit.     REVIEW OF SYSTEMS:  Constitutional: Denies fevers, chills or abnormal night sweats, (+) fatigue Eyes: Denies blurriness of vision, double vision or watery eyes Ears, nose, mouth, throat, and face: Denies mucositis or sore throat Respiratory: Denies cough, dyspnea or wheezes Cardiovascular: Denies palpitation, chest discomfort or lower extremity swelling Gastrointestinal:  Denies nausea, heartburn or change in bowel habits Skin: Denies abnormal skin rashes Musculoskeletal: (+) back pain, left hip pain, finger and toe pain. Lymphatics: Denies new lymphadenopathy or easy bruising Neurological: (+) Reports "excessive" neuropathy with numbness and pain to her fingers and toes. (+) Difficulty with balance. Behavioral/Psych: Mood is stable, no new changes. Breast: (+) occasional shooting pains All other systems were reviewed with the patient and are negative.   PHYSICAL EXAMINATION: ECOG PERFORMANCE STATUS: 1 - Symptomatic but completely ambulatory Vitals:   07/15/16 0918  BP: (!) 128/59  Pulse: 66  Resp: 18  Temp: 98.2 F (36.8 C)   GENERAL:alert, no distress and comfortable. SKIN: skin color, texture, turgor are normal, no rashes or significant lesions. EYES: normal, conjunctiva are pink and non-injected, sclera clear OROPHARYNX:no exudate, no erythema and lips, buccal mucosa, and tongue normal  NECK: supple, thyroid normal size, non-tender, without nodularity LYMPH:  no palpable lymphadenopathy in the cervical, axillary or inguinal LUNGS: clear to auscultation and percussion with normal breathing effort HEART:  regular rate & rhythm and no murmurs and no lower extremity edema ABDOMEN:abdomen soft, non-tender and normal bowel sounds MUSCULOSKELETAL:no cyanosis of digits and no clubbing. Ambulatory with a wheelchair and cane. PSYCH: alert & oriented x 3 with fluent speech NEURO: no focal motor/sensory deficits  LABORATORY DATA:  I have reviewed the data as listed CBC Latest Ref Rng & Units 07/08/2016 05/05/2016 03/27/2016  WBC 3.9 - 10.3 10e3/uL 4.0 5.2 7.2  Hemoglobin 11.6 - 15.9 g/dL 27.3 31.0 10.2(L)  Hematocrit 34.8 - 46.6 % 35.8 36.6 32.2(L)  Platelets 145 - 400 10e3/uL 209 244 252   CMP Latest Ref Rng & Units 07/08/2016 07/08/2016 05/05/2016  Glucose 70 - 140 mg/dl 88 - 90  BUN 7.0 - 78.8 mg/dL 11.7 - 27.4(H)  Creatinine 0.6 - 1.1 mg/dL 1.0 - 1.0  Sodium 930 - 145 mEq/L 144 - 142  Potassium 3.5 - 5.1 mEq/L 3.9 - 4.2  Chloride 101 - 111 mmol/L - - -  CO2 22 - 29 mEq/L 27 - 27  Calcium 8.4 - 10.4 mg/dL 9.6 - 9.5  Total Protein 6.0 - 8.5 g/dL 7.0 6.5 7.1  Total Bilirubin 0.20 - 1.20 mg/dL 5.01 - 5.99  Alkaline Phos 40 - 150 U/L 85 - 76  AST 5 - 34 U/L 20 - 24  ALT 0 - 55 U/L 11 - 16   SPEP M-protein (g/dl) 1/48/4803: 2.16 1/63/2828: 0.9 03/06/2016: 0.4 07/08/2016: 0.8   IgA (mg/dl) 9/61/0351: 175 07/30/4130: 783 03/06/2016: 668 07/08/2016: 763  Kappa, lambda light chain (mg/l) and ration  02/10/2012: 14.3, 16.3, 0.88 12/20/2015: 40.3, 12.8, 3.15 07/08/2016: 37.3, 12.7, 2.94   PATHOLOGY REPORT  Diagnosis 11/14/2015 1. Breast, lumpectomy,  Left - INVASIVE DUCTAL CARCINOMA, GRADE III/III, SPANNING 1.1 CM. - THE RESECTION MARGINS ARE NEGATIVE FOR CARCINOMA. - SEE ONCOLOGY TABLE BELOW. 2. Lymph node, sentinel, biopsy, Left Axillary - BENIGN BREAST PARENCHYMA. - LYMPH NODAL TISSUE IS NOT IDENTIFIED. - THERE IS NO EVIDENCE OF MALIGNANCY.  RADIOGRAPHIC STUDIES: I have personally reviewed the radiological images as listed and agreed with the findings in the report. Mr Lumbar Spine  Wo Contrast  Result Date: 06/19/2016 CLINICAL DATA:  83 y/o F; lower back and left buttocks pain for 6 months. EXAM: MRI LUMBAR SPINE WITHOUT CONTRAST TECHNIQUE: Multiplanar, multisequence MR imaging of the lumbar spine was performed. No intravenous contrast was administered. COMPARISON:  03/27/2016 lumbar radiographs.  08/27/2008 lumbar MRI. FINDINGS: Segmentation:  Standard.  Rudimentary S1-2 disc. Alignment:  Physiologic. Vertebrae: No fracture, evidence of discitis, or bone lesion. Left-greater-than-right generated facet effusions at L5-S1. Mild degenerative endplate edema at I0-9. Conus medullaris: Extends to the L2 level and appears normal. Paraspinal and other soft tissues: Parapelvic renal cysts bilaterally. Disc levels: L1-2: Small disc bulge with mild facet and ligamentum flavum hypertrophy greater on the left. Mild foraminal narrowing bilaterally. No significant canal stenosis. Left ligamentum flavum hypertrophy effaces the left posterior thecal sac. L2-3: Disc bulge with moderate facet and ligamentum flavum hypertrophy. Small central superiorly directed disc extrusion measuring 11 mm craniocaudal. Moderate bilateral foraminal narrowing and mild-to-moderate canal stenosis with crowding of cauda equina. L3-4: Moderate disc bulge with moderate facet and ligamentum flavum hypertrophy greater on the right. Moderate to severe right and moderate left foraminal narrowing. Moderate canal stenosis. L4-5: Moderate disc bulge eccentric to the right, right greater than left advanced facet, and moderate ligamentum flavum hypertrophy. Mild left and severe right foraminal narrowing. Mild canal stenosis. L5-S1: Moderate disc bulge with advanced facet and moderate ligamentum flavum hypertrophy. Moderate bilateral foraminal narrowing, narrowing of lateral recesses, no significant canal stenosis. IMPRESSION: 1. Diffuse lumbar spondylosis with discogenic and facet degenerative changes progressed from 2010 lumbar MRI. 2.  Multilevel canal stenosis is mild-to-moderate at L2-3, moderate L3-4, and mild at L4-5. 3. Multiple levels of mild and moderate neural foraminal narrowing with severe foraminal narrowing at the right L3-4 and L4-5 levels. Electronically Signed   By: Kristine Garbe M.D.   On: 06/19/2016 14:04    ASSESSMENT & PLAN:  83 y.o. female, with past medical history of MGUS, hypertension, arthritis, with good performance status, was found to have stage I triple negative left breast cancer by screening mammogram.  1. Malignant or new present of upper outer quadrant of left female breast, invasive ductal carcinoma, grade 3, pT1cN0M0, stage IA, ER-/PR-/HER2- --I previously discussed her breast mammogram, ultrasound, biopsy and final surgical path result in details -She had sentinel lymph node biopsy, however it showed normal breast parenchyma, no lymphoid tissue, her pathological node status is unknown, no ultrasound evidence of adenopathy. -We previously reviewed the aggressive nature of triple negative breast cancer. Giving her stage I disease, I think she probably has 20-30% risk of distant recurrence in the next 5-10 years.  -I recommended weekly Taxol for 12 weeks as adjuvant chemotherapy to reduce her risk of cancer recurrence. -She has completed adjuvant chemotherapy and radiation.  -She has residual neuropathy. We previously discussed that neuropathy recover slowly. -continue  breast cancer surveillance and survivorship. I will see her every 3-4 months for the next 2-3 years, then every 6 -12 months afterwards. She will continue annual mammogram, next due in May  -I have encouraged her to eat healy and  exercise as much as possible. -She will follow up with Dr. [name] in 1 year. After that time we will alternate appointments every 6 months.  2. MGUS -lab reviewed with her, her M-protein and light chain levels have been overall stable  -No clinical concern for disease progression. will  continue follow up SPEP/IFE, and light chain levels every 3-6 months. -Next labs will be obtained before her next visit in May 2018.  3. Anemia  -she has chronic anemia, worse in 2016 due to her surgery. -resolved now, we'll continue monitoring. -I encouraged the patient to continue eating well.  4. Joint and muscular pain, Rheumatoid arthritis -She has constant low back and left hip pain, possible related to Taxol and RA  -she will continue follow up with rheumatologist Dr. Dossie Der.  -I previously encouraged her to take the calcium and magnesium over-the-counter, drink fluids adequately. -We'll continue monitoring. -The patient has stopped taking Tramadol. -I reviewed her recent lumbar MRI findings. We discussed that the patient has degenerative osteoarthritis, related to age and not chenotherapy or radiation treatment. -I advised the patient to continue exercise to help with her ambulation and strength. She may follow up with her orthopedic surgeon to monitor this.  5. Mild peripheral neuropathy, G1-2 -No significant impact on her function, we'll continue to monitor closely. She understands that recovery from neuropathy is a slow process. -We decreased her Abraxane dose for the last 2 treatments. -She has mild gait instability due to neuropathy and fatigue, I previously recommended her to get physical therapy; she is currently going to the gym twice a week. -I advised the patient to continue B12.  6. Cataracts -The patient had surgery for cataracts on 06/03/2016 (right eye).  Plan -Continue B12 to help heal neuropathy. -Mammogram in 3 months. -Follow up with me in 4 months.   All questions were answered. The patient knows to call the clinic with any problems, questions or concerns.  I spent 20 minutes counseling the patient face to face. The total time spent in the appointment was 25 minutes and more than 50% was on counseling.  This document serves as a record of services personally  performed by Truitt Merle, MD. It was created on her behalf by Maryla Morrow, a trained medical scribe. The creation of this record is based on the scribe's personal observations and the provider's statements to them. This document has been checked and approved by the attending provider.    Truitt Merle, MD 07/15/2016

## 2016-07-15 ENCOUNTER — Telehealth: Payer: Self-pay | Admitting: Hematology

## 2016-07-15 ENCOUNTER — Encounter: Payer: Self-pay | Admitting: Hematology

## 2016-07-15 ENCOUNTER — Ambulatory Visit (HOSPITAL_BASED_OUTPATIENT_CLINIC_OR_DEPARTMENT_OTHER): Payer: Medicare Other | Admitting: Hematology

## 2016-07-15 VITALS — BP 128/59 | HR 66 | Temp 98.2°F | Resp 18 | Ht 63.0 in | Wt 213.1 lb

## 2016-07-15 DIAGNOSIS — M069 Rheumatoid arthritis, unspecified: Secondary | ICD-10-CM | POA: Diagnosis not present

## 2016-07-15 DIAGNOSIS — Z171 Estrogen receptor negative status [ER-]: Secondary | ICD-10-CM | POA: Diagnosis not present

## 2016-07-15 DIAGNOSIS — D649 Anemia, unspecified: Secondary | ICD-10-CM

## 2016-07-15 DIAGNOSIS — D472 Monoclonal gammopathy: Secondary | ICD-10-CM | POA: Diagnosis not present

## 2016-07-15 DIAGNOSIS — C50411 Malignant neoplasm of upper-outer quadrant of right female breast: Secondary | ICD-10-CM | POA: Diagnosis not present

## 2016-07-15 DIAGNOSIS — G620 Drug-induced polyneuropathy: Secondary | ICD-10-CM

## 2016-07-15 NOTE — Telephone Encounter (Signed)
Appointments scheduled per 2/21 LOS. Patient given AVS report and calendars with future scheduled appointments. °

## 2016-07-23 ENCOUNTER — Ambulatory Visit (INDEPENDENT_AMBULATORY_CARE_PROVIDER_SITE_OTHER): Payer: Medicare Other | Admitting: Neurology

## 2016-07-23 ENCOUNTER — Encounter: Payer: Self-pay | Admitting: Neurology

## 2016-07-23 VITALS — BP 124/64 | HR 57 | Ht 63.0 in | Wt 208.0 lb

## 2016-07-23 DIAGNOSIS — Z171 Estrogen receptor negative status [ER-]: Secondary | ICD-10-CM

## 2016-07-23 DIAGNOSIS — R269 Unspecified abnormalities of gait and mobility: Secondary | ICD-10-CM | POA: Diagnosis not present

## 2016-07-23 DIAGNOSIS — M545 Low back pain, unspecified: Secondary | ICD-10-CM

## 2016-07-23 DIAGNOSIS — D472 Monoclonal gammopathy: Secondary | ICD-10-CM | POA: Diagnosis not present

## 2016-07-23 DIAGNOSIS — G620 Drug-induced polyneuropathy: Secondary | ICD-10-CM | POA: Diagnosis not present

## 2016-07-23 DIAGNOSIS — C50411 Malignant neoplasm of upper-outer quadrant of right female breast: Secondary | ICD-10-CM

## 2016-07-23 MED ORDER — LIDOCAINE 2 % EX GEL: 1.0000 g | CUTANEOUS | 11 refills | Status: DC | PRN
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
## 2016-09-19 ENCOUNTER — Encounter (HOSPITAL_COMMUNITY): Payer: Self-pay | Admitting: *Deleted

## 2016-09-19 ENCOUNTER — Emergency Department (HOSPITAL_COMMUNITY)
Admission: EM | Admit: 2016-09-19 | Discharge: 2016-09-19 | Disposition: A | Discharge status: Home / Self Care | Payer: Medicare Other | Attending: Emergency Medicine | Admitting: Emergency Medicine

## 2016-09-19 DIAGNOSIS — Z7984 Long term (current) use of oral hypoglycemic drugs: Secondary | ICD-10-CM | POA: Diagnosis not present

## 2016-09-19 DIAGNOSIS — Z853 Personal history of malignant neoplasm of breast: Secondary | ICD-10-CM | POA: Insufficient documentation

## 2016-09-19 DIAGNOSIS — E119 Type 2 diabetes mellitus without complications: Secondary | ICD-10-CM | POA: Diagnosis not present

## 2016-09-19 DIAGNOSIS — I10 Essential (primary) hypertension: Secondary | ICD-10-CM | POA: Insufficient documentation

## 2016-09-19 DIAGNOSIS — F419 Anxiety disorder, unspecified: Secondary | ICD-10-CM | POA: Diagnosis not present

## 2016-09-19 DIAGNOSIS — R030 Elevated blood-pressure reading, without diagnosis of hypertension: Secondary | ICD-10-CM | POA: Diagnosis not present

## 2016-09-19 MED ORDER — SODIUM CHLORIDE 0.9 % IV SOLN: INTRAVENOUS | Status: DC

## 2016-09-19 NOTE — ED Notes (Signed)
Bed: WA07 Expected date:  Expected time:  Means of arrival:  Comments: 

## 2016-09-19 NOTE — ED Provider Notes (Signed)
[facility] DEPT Provider Note   CSN: [id number] Arrival date & time: 09/19/16  1103     History   Chief Complaint Chief Complaint  Patient presents with  . Hypertension    HPI Julie [name] is a 83 y.o. female.  83 year old female who presents after waking up feeling dizzy. Took her blood pressure was in the 200s. Patient states that she did not take her normal evening dose of her antihypertensive. Denied any associated chest pain or shortness of breath. No focal neurological deficits. No associated emesis. Called EMS and BP was initially elevated to 2 2 2/120. After about 10 minutes her blood pressure improved spontaneously. She feels much better at this time. Denies any other complaints      Past Medical History:  Diagnosis Date  . Anemia   . Anxiety    takes Klonopin at bedtime  . Arthritis    takes Methotrexate weekly and Prednisone  . Blood transfusion    no abnormal reaction noted  . Cataracts, bilateral    immature  . Complication of anesthesia    wakes up during surgery  . Depression   . Diabetes mellitus without complication ([place])   . Dyspnea    with exertion since chemo   . Fibromyalgia    takes Lyrica daily  . GERD (gastroesophageal reflux disease)    takes Nexium daily  . Glaucoma   . Hard of hearing   . Heart murmur   . Histoplasmosis   . History of colon polyps   . History of gout   . History of hiatal hernia   . History of shingles   . Hyperlipidemia   . Hypertension    takes Amlodipine,Metoprolol,and Losartan daily  . Incontinence of bowel   . Interstitial cystitis   . Joint pain   . Low back pain   . Nocturia   . Peripheral neuropathy   . Peripheral neuropathy   . PONV (postoperative nausea and vomiting)   . Ulcer 1970   duodenal  . Urinary frequency   . Urinary urgency   . Weakness    numbness in both hands and feet    Patient Active Problem List   Diagnosis Date Noted  . Peripheral neuropathy 06/08/2016  . Low back  pain without sciatica 06/08/2016  . Abnormality of gait 06/08/2016  . Anemia due to antineoplastic chemotherapy 03/06/2016  . Hypersensitivity reaction 01/07/2016  . Port catheter in place 01/03/2016  . Malignant neoplasm of upper outer quadrant of female breast ([place]) 10/30/2015  . Primary osteoarthritis of right knee 02/28/2015  . Post-traumatic arthritis of ankle 05/10/2014  . MGUS (monoclonal gammopathy of unknown significance) 02/17/2012  . Diverticulosis of colon (without mention of hemorrhage) 01/21/2011  . Family history of malignant neoplasm of gastrointestinal tract 01/21/2011  . Special screening for malignant neoplasms, colon 01/21/2011  . Other symptoms involving digestive system(787.99) 01/21/2011    Past Surgical History:  Procedure Laterality Date  . ABDOMINAL HYSTERECTOMY    . accupuncture  3 yrs ago  . ANKLE FUSION Left 05/10/2014   Procedure: LEFT ANKLE ARTHRODESIS ;  Surgeon: Wylene Simmer, MD;  Location: [facility];  Service: Orthopedics;  Laterality: Left;  . ANKLE SURGERY Left   . arm surgery Left    radius  . bladder tacked     . CERVICAL FUSION    . CHOLECYSTECTOMY    . COLONOSCOPY    . CYSTOCELE REPAIR    . DILATION AND CURETTAGE OF UTERUS    .  ESOPHAGOGASTRODUODENOSCOPY    . PORT A CATH REVISION N/A 12/27/2015   Procedure: PORT A CATH REVISION;  Surgeon: Rolm Bookbinder, MD;  Location: [facility];  Service: General;  Laterality: N/A;  . PORT-A-CATH REMOVAL N/A 03/25/2016   Procedure: REMOVAL PORT-A-CATH;  Surgeon: Rolm Bookbinder, MD;  [facility];  Service: General;  Laterality: N/A;  . PORTACATH PLACEMENT Right 12/27/2015   Procedure: INSERTION PORT-A-CATH WITH ULTRASOUND ;  Surgeon: Rolm Bookbinder, MD;  Location: [facility];  Service: General;  Laterality: Right;  . RADIOACTIVE SEED GUIDED MASTECTOMY WITH AXILLARY SENTINEL LYMPH NODE BIOPSY Left 11/14/2015   Procedure: RADIOACTIVE SEED GUIDED LEFT BREAST LUMPECTOMY WITH  AXILLARY SENTINEL LYMPH NODE BIOPSY;  Surgeon: Rolm Bookbinder, MD;  Location: [facility];  Service: General;  Laterality: Left;  RADIOACTIVE SEED GUIDED LEFT BREAST LUMPECTOMY WITH AXILLARY SENTINEL LYMPH NODE BIOPSY  . SALPINGOOPHORECTOMY Bilateral   . TOTAL KNEE ARTHROPLASTY Right 02/28/2015   Procedure: RIGHT TOTAL KNEE ARTHROPLASTY;  Surgeon: Rod Can, MD;  [facility];  Service: Orthopedics;  Laterality: Right;  . UPPER GASTROINTESTINAL ENDOSCOPY    . VAGINAL HYSTERECTOMY      OB History    No data available       Home Medications    Prior to Admission medications   Medication Sig Start Date End Date Taking? Authorizing Provider  acetaminophen (TYLENOL) 500 MG tablet Take 500 mg by mouth every 6 (six) hours as needed (for pain/fever.).     Historical Provider, MD  amLODipine (NORVASC) 10 MG tablet Take 10 mg by mouth every morning.     Historical Provider, MD  Biotin 10000 MCG TABS Take 1 tablet by mouth daily.    Historical Provider, MD  calcium-vitamin D (OSCAL WITH D) 500-200 MG-UNIT per tablet Take 1 tablet by mouth every morning.    Historical Provider, MD  ezetimibe (ZETIA) 10 MG tablet Take 10 mg by mouth daily.     Historical Provider, MD  fish oil-omega-3 fatty acids 1000 MG capsule Take 1 g by mouth every morning.     Historical Provider, MD  folic acid (FOLVITE) 1 MG tablet Take 2 mg by mouth every morning.     Historical Provider, MD  hydroxypropyl methylcellulose / hypromellose (ISOPTO TEARS / GONIOVISC) 2.5 % ophthalmic solution Place 1 drop into both eyes daily as needed for dry eyes.    Historical Provider, MD  Lidocaine 2 % GEL Apply 1 g topically as needed. 07/23/16   Marcial Pacas, MD  linaclotide (LINZESS) 145 MCG CAPS capsule Take 145 mcg by mouth daily before breakfast.    Historical Provider, MD  meloxicam (MOBIC) 15 MG tablet Take 15 mg by mouth daily. 11/09/15   Historical Provider, MD  metFORMIN (GLUCOPHAGE) 500 MG tablet Take 500 mg by  mouth daily.     Historical Provider, MD  metoprolol succinate (TOPROL-XL) 50 MG 24 hr tablet TAKE 1 TABLET AT BEDTIME ONCE A DAY ORALLY 90 DAYS 08/29/15   Historical Provider, MD  pantoprazole (PROTONIX) 40 MG tablet Take 1 tablet (40 mg total) by mouth daily. 05/15/16   Truitt Merle, MD  pregabalin (LYRICA) 75 MG capsule Take 1 capsule (75 mg total) by mouth 3 (three) times daily. 06/08/16   Marcial Pacas, MD  traMADol (ULTRAM) 50 MG tablet TAKE 1 TABLET BY MOUTH EVERY 6 TO 8 HOURS AS NEEDED FOR 30 DAYS 06/15/16   Truitt Merle, MD  valsartan-hydrochlorothiazide (DIOVAN-HCT) 320-12.5 MG per tablet Take 1 tablet by  mouth daily.    Historical Provider, MD  vitamin B-12 (CYANOCOBALAMIN) 1000 MCG tablet Take 1,000 mcg by mouth daily.    Historical Provider, MD  vitamin E 400 UNIT capsule Take 400 Units by mouth daily.    Historical Provider, MD    Family History Family History  Problem Relation Age of Onset  . Colon cancer Sister 3  . Esophageal cancer Brother 23  . Breast cancer Mother   . Brain cancer Other     Social History Social History  Substance Use Topics  . Smoking status: Never Smoker  . Smokeless tobacco: Never Used  . Alcohol use No     Comment: IN [place] ONLY     Allergies   Codeine; Other; and Aspirin   Review of Systems Review of Systems  All other systems reviewed and are negative.    Physical Exam Updated Vital Signs BP (!) 143/69 (BP Location: Right Arm)   Pulse 64   Temp 97.8 F (36.6 C) (Oral)   Resp 18   Ht 5\' 3"  (1.6 m)   Wt 98 kg   SpO2 100%   BMI 38.26 kg/m   Physical Exam  Constitutional: She is oriented to person, place, and time. She appears well-developed and well-nourished.  Non-toxic appearance. No distress.  HENT:  Head: Normocephalic and atraumatic.  Eyes: Conjunctivae, EOM and lids are normal. Pupils are equal, round, and reactive to light.  Neck: Normal range of motion. Neck supple. No tracheal deviation present. No thyroid mass present.    Cardiovascular: Normal rate, regular rhythm and normal heart sounds.  Exam reveals no gallop.   No murmur heard. Pulmonary/Chest: Effort normal and breath sounds normal. No stridor. No respiratory distress. She has no decreased breath sounds. She has no wheezes. She has no rhonchi. She has no rales.  Abdominal: Soft. Normal appearance and bowel sounds are normal. She exhibits no distension. There is no tenderness. There is no rebound and no CVA tenderness.  Musculoskeletal: Normal range of motion. She exhibits no edema or tenderness.  Neurological: She is alert and oriented to person, place, and time. She has normal strength. No cranial nerve deficit or sensory deficit. GCS eye subscore is 4. GCS verbal subscore is 5. GCS motor subscore is 6.  Skin: Skin is warm and dry. No abrasion and no rash noted.  Psychiatric: She has a normal mood and affect. Her speech is normal and behavior is normal.  Nursing note and vitals reviewed.    ED Treatments / Results  Labs (all labs ordered are listed, but only abnormal results are displayed) Labs Reviewed - No data to display  EKG  EKG Interpretation  Date/Time:  Saturday September 19 2016 11:38:31 EDT Ventricular Rate:  62 PR Interval:    QRS Duration: 92 QT Interval:  460 QTC Calculation: 468 R Axis:   13 Text Interpretation:  Sinus rhythm Borderline T abnormalities, lateral leads No significant change since last tracing Confirmed by Timeka Goette  MD, Reona Zendejas ([id number]) on 09/19/2016 12:33:16 PM       Radiology No results found.  Procedures Procedures (including critical care time)  Medications Ordered in ED Medications  0.9 %  sodium chloride infusion (not administered)     Initial Impression / Assessment and Plan / ED Course  I have reviewed the triage vital signs and the nursing notes.  Pertinent labs & imaging results that were available during my care of the patient were reviewed by me and considered in my medical decision making (see  chart for details).     Patient's blood pressure here noted. Does not appear to be in hypertensive emergency or crisis. Was instructed to take her normal blood pressure medication as she gets home and follow-up with her Dr.  Final Clinical Impressions(s) / ED Diagnoses   Final diagnoses:  None    New Prescriptions New Prescriptions   No medications on file     Lacretia Leigh, MD 09/19/16 1234

## 2016-09-19 NOTE — Discharge Instructions (Signed)
Take her normal evening dose of her metoprolol when you get home. Call your primary care doctor on Monday to schedule a follow-up visit

## 2016-09-19 NOTE — ED Triage Notes (Signed)
Pt bib EMS and woke up feeling dizzy. Pt took her own BP and it read in the 200s.  On EMS arrival, pt's BP was found to be 222/120.  Pt also c/o nausea. and taking her meds and prescribed.  After 10 minuites of EMS arrival, HTN, dizziness and nausea disappeared.  Hx HTN.   Pt also mentioned that she was up all night playing poker with friends. Pt has taken her medications this am along with breakfast and insulin.

## 2016-09-21 ENCOUNTER — Other Ambulatory Visit: Payer: Self-pay | Admitting: *Deleted

## 2016-09-21 DIAGNOSIS — K219 Gastro-esophageal reflux disease without esophagitis: Secondary | ICD-10-CM | POA: Diagnosis not present

## 2016-09-21 DIAGNOSIS — Z170 Estrogen receptor positive status [ER+]: Principal | ICD-10-CM

## 2016-09-21 DIAGNOSIS — R002 Palpitations: Secondary | ICD-10-CM | POA: Diagnosis not present

## 2016-09-21 DIAGNOSIS — I10 Essential (primary) hypertension: Secondary | ICD-10-CM | POA: Diagnosis not present

## 2016-09-21 DIAGNOSIS — C50411 Malignant neoplasm of upper-outer quadrant of right female breast: Secondary | ICD-10-CM

## 2016-09-21 DIAGNOSIS — R0789 Other chest pain: Secondary | ICD-10-CM | POA: Diagnosis not present

## 2016-09-21 MED ORDER — PANTOPRAZOLE SODIUM 40 MG PO TBEC: 40.0000 mg | DELAYED_RELEASE_TABLET | Freq: Every day | ORAL | 0 refills | Status: DC

## 2016-09-22 DIAGNOSIS — R011 Cardiac murmur, unspecified: Secondary | ICD-10-CM | POA: Diagnosis not present

## 2016-09-22 DIAGNOSIS — R079 Chest pain, unspecified: Secondary | ICD-10-CM | POA: Diagnosis not present

## 2016-09-22 DIAGNOSIS — E7800 Pure hypercholesterolemia, unspecified: Secondary | ICD-10-CM | POA: Diagnosis not present

## 2016-09-22 DIAGNOSIS — I10 Essential (primary) hypertension: Secondary | ICD-10-CM | POA: Diagnosis not present

## 2016-10-07 DIAGNOSIS — I10 Essential (primary) hypertension: Secondary | ICD-10-CM | POA: Diagnosis not present

## 2016-10-07 DIAGNOSIS — R011 Cardiac murmur, unspecified: Secondary | ICD-10-CM | POA: Diagnosis not present

## 2016-10-09 DIAGNOSIS — N390 Urinary tract infection, site not specified: Secondary | ICD-10-CM | POA: Diagnosis not present

## 2016-10-09 DIAGNOSIS — E119 Type 2 diabetes mellitus without complications: Secondary | ICD-10-CM | POA: Diagnosis not present

## 2016-10-09 DIAGNOSIS — E538 Deficiency of other specified B group vitamins: Secondary | ICD-10-CM | POA: Diagnosis not present

## 2016-10-09 DIAGNOSIS — E559 Vitamin D deficiency, unspecified: Secondary | ICD-10-CM | POA: Diagnosis not present

## 2016-10-09 DIAGNOSIS — I10 Essential (primary) hypertension: Secondary | ICD-10-CM | POA: Diagnosis not present

## 2016-10-09 DIAGNOSIS — M810 Age-related osteoporosis without current pathological fracture: Secondary | ICD-10-CM | POA: Diagnosis not present

## 2016-10-10 IMAGING — CR DG CHEST 1V PORT
1 series · 1 of 1 positions shown · non-contrast
Comparison: None.

CLINICAL DATA: Right-sided Port-A-Cath placement.

EXAM:
PORTABLE CHEST 1 VIEW

[AP]
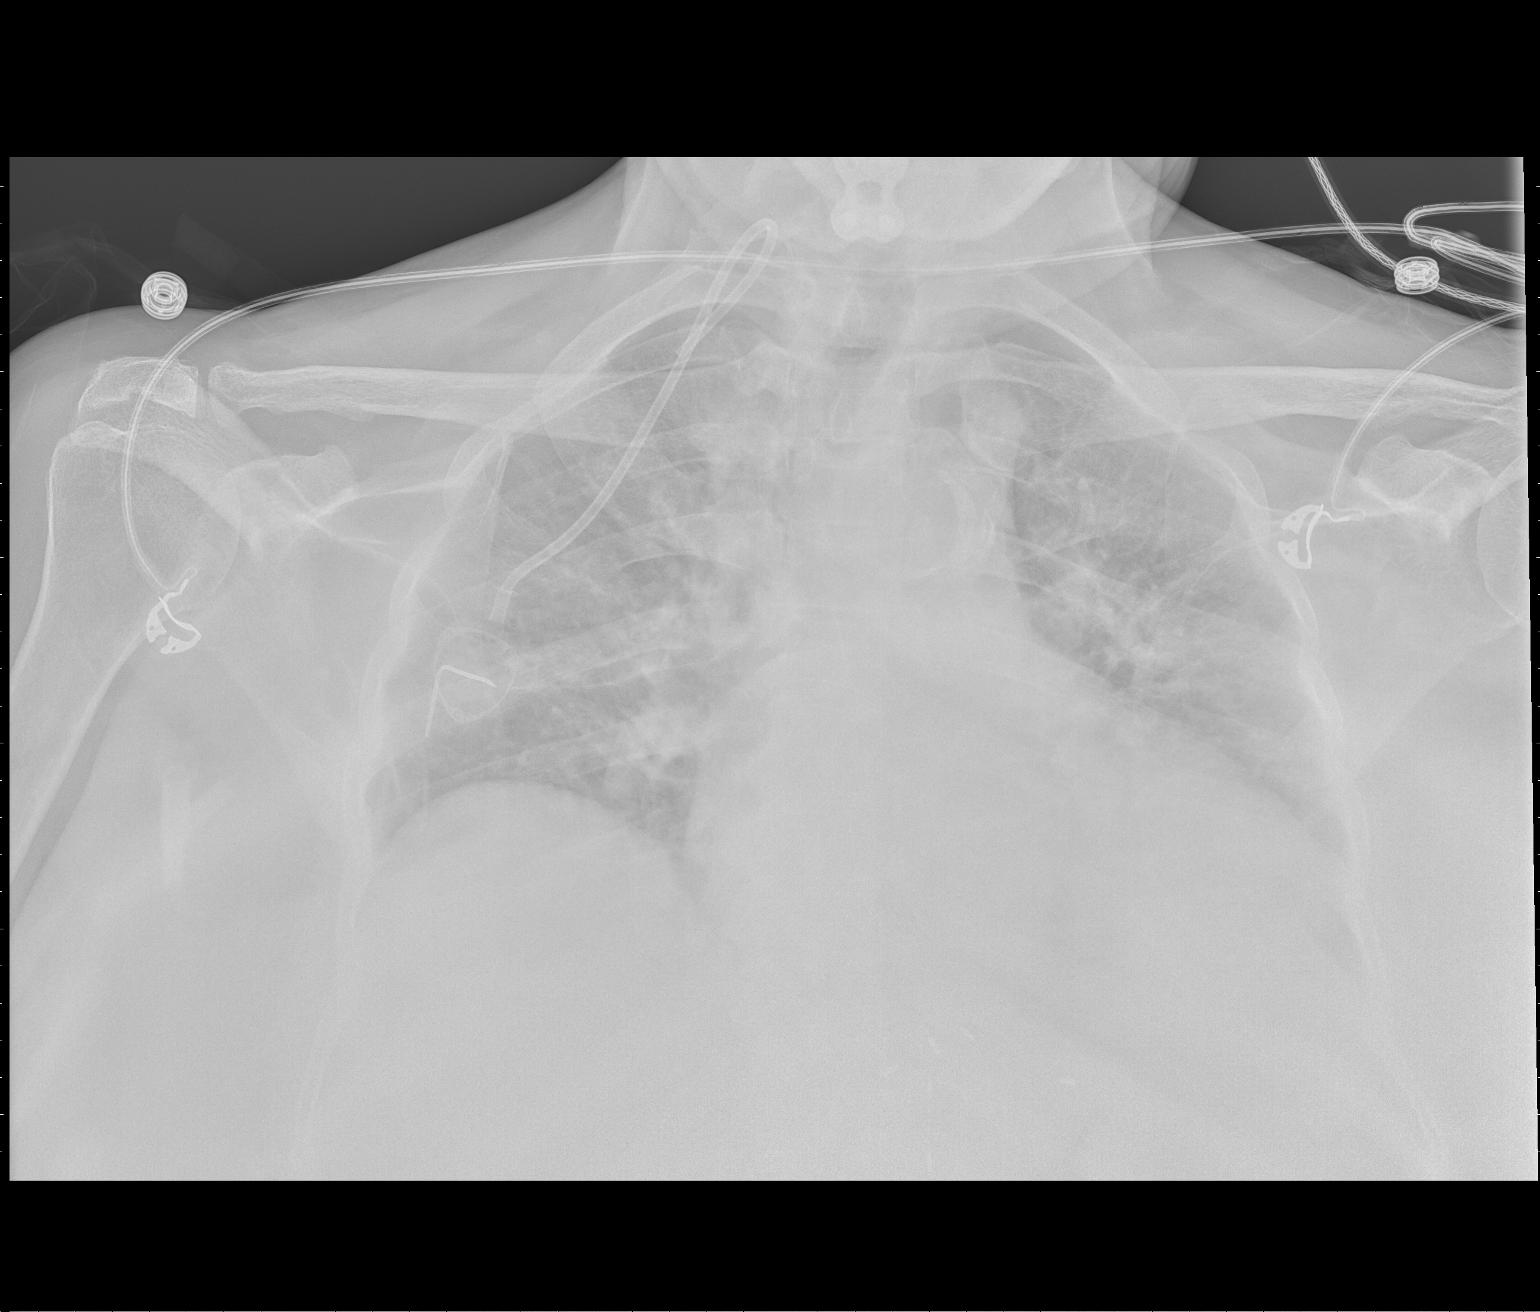

[1 of 1 positions shown; findings below may reference images not displayed]

FINDINGS: A right-sided Port-A-Cath is now in place. The catheter is accessed.
The catheter crosses over the clavicle to the right neck. The tip of
the catheter is above the clavicle. This may be within the internal
jugular vein.

Lung volumes are low. The heart is enlarged. Mild edema is present.
Left basilar airspace opacities are present. Atherosclerotic changes
are present at the aortic arch.

The visualized soft tissues and bony thorax are unremarkable.
IMPRESSION: 1. Interval placement of right IJ Port-A-Cath.
2. No pneumothorax.
3. The tip of the catheter is above the clavicle, presumably still
within the internal jugular vein. It should not be used until
further assessed. The catheter may have been pulled back by soft
tissue as the patient sat more upright.
These results were called by telephone at the time of interpretation
on 12/27/2015 at [DATE] to Dr. KLARALIZ ZAEB , who verbally
acknowledged these results.

## 2016-10-12 DIAGNOSIS — I10 Essential (primary) hypertension: Secondary | ICD-10-CM | POA: Diagnosis not present

## 2016-10-12 DIAGNOSIS — E119 Type 2 diabetes mellitus without complications: Secondary | ICD-10-CM | POA: Diagnosis not present

## 2016-10-12 DIAGNOSIS — R0789 Other chest pain: Secondary | ICD-10-CM | POA: Diagnosis not present

## 2016-10-16 DIAGNOSIS — E7800 Pure hypercholesterolemia, unspecified: Secondary | ICD-10-CM | POA: Diagnosis not present

## 2016-10-16 DIAGNOSIS — I10 Essential (primary) hypertension: Secondary | ICD-10-CM | POA: Diagnosis not present

## 2016-10-16 DIAGNOSIS — E559 Vitamin D deficiency, unspecified: Secondary | ICD-10-CM | POA: Diagnosis not present

## 2016-10-16 DIAGNOSIS — E119 Type 2 diabetes mellitus without complications: Secondary | ICD-10-CM | POA: Diagnosis not present

## 2016-10-16 DIAGNOSIS — Z0000 Encounter for general adult medical examination without abnormal findings: Secondary | ICD-10-CM | POA: Diagnosis not present

## 2016-10-26 DIAGNOSIS — I10 Essential (primary) hypertension: Secondary | ICD-10-CM | POA: Diagnosis not present

## 2016-10-26 DIAGNOSIS — E7800 Pure hypercholesterolemia, unspecified: Secondary | ICD-10-CM | POA: Diagnosis not present

## 2016-10-26 DIAGNOSIS — R079 Chest pain, unspecified: Secondary | ICD-10-CM | POA: Diagnosis not present

## 2016-10-26 DIAGNOSIS — I080 Rheumatic disorders of both mitral and aortic valves: Secondary | ICD-10-CM | POA: Diagnosis not present

## 2016-10-26 NOTE — Progress Notes (Signed)
[facility]  [phone] [phone]  Clinic follow up Note   Patient Care Team: [name], MD as PCP - General (Internal Medicine) Valinda Party, MD as Referring Physician (Rheumatology) 11/04/2016  CHIEF COMPLAINTS:  Follow up left breast cancer   Oncology History   Malignant neoplasm of upper outer quadrant of female breast [facility])   Staging form: Breast, AJCC 7th Edition     Pathologic stage from 11/14/2015: Stage IA (T1c, N0, cM0) - Signed by Truitt Merle, MD on 11/25/2015     Clinical stage from 11/20/2015: Stage IA (T1c, N0, M0) - Signed by Curt Bears, MD on 11/20/2015       Malignant neoplasm of upper outer quadrant of female breast ([place])   10/09/2015 Mammogram    Screening bilateral mammograms showed a possible left breast mass. Diagnostic mammogram and ultrasound showed a 7 x 7 x 7 mm mass in the left breast at 11:00 position.      10/22/2015 Receptors her2    ER negative, PR negative, HER-2 not amplified      10/22/2015 Initial Biopsy    Left breast needle core biopsy at the 1:00 position showed invasive ductal carcinoma.      10/22/2015 Initial Diagnosis    Malignant neoplasm of upper outer quadrant of female breast ([place])      11/14/2015 Surgery    Left breast lumpectomy and sentinel lymph node biopsy [name])      11/14/2015 Pathology Results    Left breast lumpectomy showed invasive ductal carcinoma, grade 3, 1.1 cm, resection margins were negative, sentinel lymph node biopsy showed benign breast parenchyma, lymph node tissue not identified. Repeated ER PR and HER-2 were all negative.      12/20/2015 - 03/13/2016 Chemotherapy    Weekly Taxol 80 mg/m x 2 cycles, then changed to Abraxane 100 mg/m for cycles 3-12 due to infusion reaction. Abraxane dose-reduced cycle #9, then again for cycles #11 & #12 due to neuropathy. [total 12 cycles chemo]       04/01/2016 - 05/11/2016 Radiation Therapy    Adjuvant breast radiation  ([place]). Left breast: 50.4 Gy in 28 fractions      06/19/2016 Imaging    MR Lumbar Spine without contrast  IMPRESSION: 1. Diffuse lumbar spondylosis with discogenic and facet degenerative changes progressed from 2010 lumbar MRI. 2. Multilevel canal stenosis is mild-to-moderate at L2-3, moderate L3-4, and mild at L4-5. 3. Multiple levels of mild and moderate neural foraminal narrowing with severe foraminal narrowing at the right L3-4 and L4-5 levels.       HISTORY OF PRESENTING ILLNESS:  Julie [name] 83 y.o. female is here because of her recently diagnosed left breast cancer. She has been seen my partner Dr. Julien Nordmann for MGUS, and was referred to me to discuss her adjuvant therapy for breast cancer. She is accompanied by her daughter to the clinic today.  Her breast cancer was discovered by screening mammogram. She has no palpable breast mass, skin change or nipple discharge. She denies any new constitutional symptoms. The diagnostic mammogram and ultrasound showed a 7 mm mass in the left breast, biopsy showed invasive ductal carcinoma, triple negative. She was seen by breast surgeon Dr. [name], and underwent left rest lumpectomy and sentinel lymph node biopsy on 11/14/2015.   She had a multiple orthopedic surgeries in the past, takes tramadol as needed. She lives with her daughter, pretty independent, does not exercise regularly, but it goes out for shopping, playing cards, etc.  GYN HISTORY  Menarchal: 16 LMP: 58 (hysterectomy) Contraceptive: 2 HRT: 1 yr  G5P5: she did breast feeding to 2 children   CURRENT THERAPY: Surveillance  INTERIM HISTORY:  Julie [name] returns for follow-up. She presents to the clinic today with her daughter and she reports to be doing OK. Her energy is back and she is not tired. She has numbness and tingling badly. The feeling in her fingers will not allow her to feel hotness or tough for a little bit. She is not able to pick up small items. Her toes are  getting better but she cannot put on full shoes. Denies pain. She has extreme back pain which resolves after she starts walking. She wants to know what to do about the color in her breast from radiation. She asks Dr. [name] told her she has no limitation and she reports to starting back at the gym and wonders if a new lump is from a pulled muscle. Still have shooting pain in her breast. She also has itching around her port.  Her family is going on a trip to the beach and wants to know how long she can be in the sun. She does not check herself at home.    MEDICAL HISTORY:  Past Medical History:  Diagnosis Date  . Anemia   . Anxiety    takes Klonopin at bedtime  . Arthritis    takes Methotrexate weekly and Prednisone  . Blood transfusion    no abnormal reaction noted  . Cataracts, bilateral    immature  . Complication of anesthesia    wakes up during surgery  . Depression   . Diabetes mellitus without complication ([place])   . Dyspnea    with exertion since chemo   . Fibromyalgia    takes Lyrica daily  . GERD (gastroesophageal reflux disease)    takes Nexium daily  . Glaucoma   . Hard of hearing   . Heart murmur   . Histoplasmosis   . History of colon polyps   . History of gout   . History of hiatal hernia   . History of shingles   . Hyperlipidemia   . Hypertension    takes Amlodipine,Metoprolol,and Losartan daily  . Incontinence of bowel   . Interstitial cystitis   . Joint pain   . Low back pain   . Nocturia   . Peripheral neuropathy   . Peripheral neuropathy   . PONV (postoperative nausea and vomiting)   . Ulcer 1970   duodenal  . Urinary frequency   . Urinary urgency   . Weakness    numbness in both hands and feet    SURGICAL HISTORY: Past Surgical History:  Procedure Laterality Date  . ABDOMINAL HYSTERECTOMY    . accupuncture  3 yrs ago  . ANKLE FUSION Left 05/10/2014   Procedure: LEFT ANKLE ARTHRODESIS ;  Surgeon: Wylene Simmer, MD;  Location: [place];   Service: Orthopedics;  Laterality: Left;  . ANKLE SURGERY Left   . arm surgery Left    radius  . bladder tacked     . CERVICAL FUSION    . CHOLECYSTECTOMY    . COLONOSCOPY    . CYSTOCELE REPAIR    . DILATION AND CURETTAGE OF UTERUS    . ESOPHAGOGASTRODUODENOSCOPY    . PORT A CATH REVISION N/A 12/27/2015   Procedure: PORT A CATH REVISION;  Surgeon: Rolm Bookbinder, MD;  Location: [place];  Service: General;  Laterality: N/A;  . PORT-A-CATH REMOVAL  N/A 03/25/2016   Procedure: REMOVAL PORT-A-CATH;  Surgeon: Rolm Bookbinder, MD;  Location: [facility];  Service: General;  Laterality: N/A;  . PORTACATH PLACEMENT Right 12/27/2015   Procedure: INSERTION PORT-A-CATH WITH ULTRASOUND ;  Surgeon: Rolm Bookbinder, MD;  Location: [place];  Service: General;  Laterality: Right;  . RADIOACTIVE SEED GUIDED MASTECTOMY WITH AXILLARY SENTINEL LYMPH NODE BIOPSY Left 11/14/2015   Procedure: RADIOACTIVE SEED GUIDED LEFT BREAST LUMPECTOMY WITH AXILLARY SENTINEL LYMPH NODE BIOPSY;  Surgeon: Rolm Bookbinder, MD;  Location: [place];  Service: General;  Laterality: Left;  RADIOACTIVE SEED GUIDED LEFT BREAST LUMPECTOMY WITH AXILLARY SENTINEL LYMPH NODE BIOPSY  . SALPINGOOPHORECTOMY Bilateral   . TOTAL KNEE ARTHROPLASTY Right 02/28/2015   Procedure: RIGHT TOTAL KNEE ARTHROPLASTY;  Surgeon: Rod Can, MD;  Location: [facility];  Service: Orthopedics;  Laterality: Right;  . UPPER GASTROINTESTINAL ENDOSCOPY    . VAGINAL HYSTERECTOMY      SOCIAL HISTORY: Social History   Social History  . Marital status: Divorced    Spouse name: N/A  . Number of children: 5  . Years of education: some college   Occupational History  . Retired    Social History Main Topics  . Smoking status: Never Smoker  . Smokeless tobacco: Never Used  . Alcohol use No     Comment: IN [place] ONLY  . Drug use: No  . Sexual activity: Not on file   Other Topics Concern  . Not on file    Social History Narrative   Lives at home with her daughter.   Right-handed.   0.5 cup caffeine per day.    FAMILY HISTORY: Family History  Problem Relation Age of Onset  . Colon cancer Sister 44  . Esophageal cancer Brother 63  . Breast cancer Mother   . Brain cancer Other     ALLERGIES:  is allergic to codeine; other; and aspirin.  MEDICATIONS:  Current Outpatient Prescriptions  Medication Sig Dispense Refill  . amLODipine (NORVASC) 10 MG tablet Take 10 mg by mouth every morning.     . Biotin 10000 MCG TABS Take 1 tablet by mouth daily.    . calcium-vitamin D (OSCAL WITH D) 500-200 MG-UNIT per tablet Take 1 tablet by mouth every morning.    . ezetimibe (ZETIA) 10 MG tablet Take 10 mg by mouth daily.     . fish oil-omega-3 fatty acids 1000 MG capsule Take 1 g by mouth every morning.     . folic acid (FOLVITE) 1 MG tablet Take 2 mg by mouth every morning.     . hydroxypropyl methylcellulose / hypromellose (ISOPTO TEARS / GONIOVISC) 2.5 % ophthalmic solution Place 1 drop into both eyes daily as needed for dry eyes.    . meloxicam (MOBIC) 15 MG tablet Take 15 mg by mouth daily.  11  . metFORMIN (GLUCOPHAGE) 500 MG tablet Take 500 mg by mouth daily.     . metoprolol succinate (TOPROL-XL) 50 MG 24 hr tablet TAKE 1 TABLET AT BEDTIME ONCE A DAY ORALLY 90 DAYS  2  . pantoprazole (PROTONIX) 40 MG tablet Take 1 tablet (40 mg total) by mouth daily. 90 tablet 0  . pregabalin (LYRICA) 75 MG capsule Take 1 capsule (75 mg total) by mouth 3 (three) times daily. 90 capsule 5  . valsartan-hydrochlorothiazide (DIOVAN-HCT) 320-12.5 MG per tablet Take 1 tablet by mouth daily.    . vitamin B-12 (CYANOCOBALAMIN) 1000 MCG tablet Take 1,000 mcg by mouth daily.    [name]  vitamin E 400 UNIT capsule Take 400 Units by mouth daily.    [name] acetaminophen (TYLENOL) 500 MG tablet Take 500 mg by mouth every 6 (six) hours as needed (for pain/fever.).     [name] doxazosin (CARDURA) 1 MG tablet Take 1 mg by mouth daily.  1   . Lidocaine 2 % GEL Apply 1 g topically as needed. 2 Tube 11  . pravastatin (PRAVACHOL) 10 MG tablet Take 10 mg by mouth daily.  3  . traMADol (ULTRAM) 50 MG tablet TAKE 1 TABLET BY MOUTH EVERY 6 TO 8 HOURS AS NEEDED FOR 30 DAYS (Patient not taking: Reported on 11/04/2016) 60 tablet 0   No current facility-administered medications for this visit.     REVIEW OF SYSTEMS:  Constitutional: Denies fevers, chills or abnormal night sweats, (+) fatigue Eyes: Denies blurriness of vision, double vision or watery eyes Ears, nose, mouth, throat, and face: Denies mucositis or sore throat Respiratory: Denies cough, dyspnea or wheezes Cardiovascular: Denies palpitation, chest discomfort or lower extremity swelling Gastrointestinal:  Denies nausea, heartburn or change in bowel habits Skin: Denies abnormal skin rashes Musculoskeletal: (+) back pain, left hip pain, Lymphatics: Denies new lymphadenopathy or easy bruising Neurological: (+) Reports "excessive" neuropathy with numbness to her fingers and improvement in toes. (+) Difficulty with balance. Behavioral/Psych: Mood is stable, no new changes. Breast: (+) occasional shooting pains All other systems were reviewed with the patient and are negative. (+) skin discoloration from radiation  PHYSICAL EXAMINATION: ECOG PERFORMANCE STATUS: 1 - Symptomatic but completely ambulatory Vitals:   11/04/16 0847  BP: (!) 160/72  Resp: 18  Temp: 97.8 F (36.6 C)     GENERAL:alert, no distress and comfortable. SKIN: skin color, texture, turgor are normal, no rashes or significant lesions. EYES: normal, conjunctiva are pink and non-injected, sclera clear OROPHARYNX:no exudate, no erythema and lips, buccal mucosa, and tongue normal  NECK: supple, thyroid normal size, non-tender, without nodularity LYMPH:  no palpable lymphadenopathy in the cervical, axillary or inguinal LUNGS: clear to auscultation and percussion with normal breathing effort HEART: regular  rate & rhythm and no murmurs and no lower extremity edema ABDOMEN:abdomen soft, non-tender and normal bowel sounds MUSCULOSKELETAL:no cyanosis of digits and no clubbing. Ambulatory with a wheelchair and cane. PSYCH: alert & oriented x 3 with fluent speech NEURO: no focal motor/sensory deficits Breasts: Breast inspection showed them to be symmetrical with no nipple discharge. Palpation of the breasts and axilla revealed no obvious mass that I could appreciate.(+) Scar in UOQ next to nipple in incision of left breast, non-tender. incision in left axilla is well healed.    LABORATORY DATA:  I have reviewed the data as listed CBC Latest Ref Rng & Units 11/04/2016 07/08/2016 05/05/2016  WBC 3.9 - 10.3 10e3/uL 4.1 4.0 5.2  Hemoglobin 11.6 - 15.9 g/dL 12.2 11.6 11.6  Hematocrit 34.8 - 46.6 % 38.3 35.8 36.6  Platelets 145 - 400 10e3/uL 200 209 244   CMP Latest Ref Rng & Units 11/04/2016 07/08/2016 07/08/2016  Glucose 70 - 140 mg/dl 84 88 -  BUN 7.0 - 26.0 mg/dL 25.5 17.8 -  Creatinine 0.6 - 1.1 mg/dL 1.1 1.0 -  Sodium 136 - 145 mEq/L 144 144 -  Potassium 3.5 - 5.1 mEq/L 4.1 3.9 -  Chloride 101 - 111 mmol/L - - -  CO2 22 - 29 mEq/L 28 27 -  Calcium 8.4 - 10.4 mg/dL 9.7 9.6 -  Total Protein 6.4 - 8.3 g/dL 7.2 7.0 6.5  Total Bilirubin 0.20 -  1.20 mg/dL 0.67 0.60 -  Alkaline Phos 40 - 150 U/L 95 85 -  AST 5 - 34 U/L 22 20 -  ALT 0 - 55 U/L 10 11 -    SPEP M-protein (g/dl) 02/10/2012: 0.74 12/20/2015: 0.9 03/06/2016: 0.4 07/08/2016: 0.8 11/04/16: PENDING    IgA (mg/dl) 02/10/2012: 822 12/20/2015: 783 03/06/2016: 668 07/08/2016: 763 11/04/16: PENDING   Kappa, lambda light chain (mg/l) and ration  02/10/2012: 14.3, 16.3, 0.88 12/20/2015: 40.3, 12.8, 3.15 07/08/2016: 37.3, 12.7, 2.94 11/04/16: PENDING   PATHOLOGY REPORT  Diagnosis 11/14/2015 1. Breast, lumpectomy, Left - INVASIVE DUCTAL CARCINOMA, GRADE III/III, SPANNING 1.1 CM. - THE RESECTION MARGINS ARE NEGATIVE FOR CARCINOMA. - SEE  ONCOLOGY TABLE BELOW. 2. Lymph node, sentinel, biopsy, Left Axillary - BENIGN BREAST PARENCHYMA. - LYMPH NODAL TISSUE IS NOT IDENTIFIED. - THERE IS NO EVIDENCE OF MALIGNANCY.  RADIOGRAPHIC STUDIES: I have personally reviewed the radiological images as listed and agreed with the findings in the report. No results found.  ASSESSMENT & PLAN:  83 y.o. female, with past medical history of MGUS, hypertension, arthritis, with good performance status, was found to have stage I triple negative left breast cancer by screening mammogram.  1. Malignant or new present of upper outer quadrant of left female breast, invasive ductal carcinoma, grade 3, pT1cN0M0, stage IA, ER-/PR-/HER2- --I previously discussed her breast mammogram, ultrasound, biopsy and final surgical path result in details -She had sentinel lymph node biopsy, however it showed normal breast parenchyma, no lymphoid tissue, her pathological node status is unknown, no ultrasound evidence of adenopathy. -We previously reviewed the aggressive nature of triple negative breast cancer. Giving her stage I disease, I think she probably has 20-30% risk of distant recurrence in the next 5-10 years.  -I recommended weekly Taxol for 12 weeks as adjuvant chemotherapy to reduce her risk of cancer recurrence. -She has completed adjuvant chemotherapy and radiation.  -She has residual neuropathy. We previously discussed that neuropathy recover slowly. -I have encouraged her to eat healy and exercise as much as possible. -She was overdue for next mammogram and she will call them back to schedule  -Labs reviewed, CBC and CMP are WNL. She is clinically doing well, exam was unremarkable, there is no clinical concern for recurrence. -I encouraged that in the summer for trips she can use sunscreen and hats to protect skin from over-exposure.  -Will monitor scarring in left breast with next mammogram  -Continue breast cancer surveillance. I encouraged her to do  self exam, and I'll see her every 4 months for this year, then every 6 months from next year.   2. MGUS -lab reviewed with her, her M-protein and light chain levels have been overall stable  -No clinical concern for disease progression. will continue follow up SPEP/IFE, and light chain levels every 3-6 months. -No clinical concern for disease progression. We repeated her lab today, results still pending.  3. Anemia  -she has chronic anemia, worse in 2016 due to her surgery. -resolved now, we'll continue monitoring.  4. Joint and muscular pain, Rheumatoid arthritis -She has constant low back and left hip pain, possible related to Taxol and RA  -she will continue follow up with rheumatologist Dr. Dossie Der.  -I previously encouraged her to take the calcium and magnesium over-the-counter, drink fluids adequately. -We'll continue monitoring. -The patient has stopped taking Tramadol. -I previously reviewed her recent lumbar MRI findings. We discussed that the patient has degenerative osteoarthritis, related to age and not chenotherapy or radiation treatment. -I previously  advised the patient to continue exercise to help with her ambulation and strength. She may follow up with her orthopedic surgeon to monitor this.  5. Mild peripheral neuropathy, G1-2 -No significant impact on her function, we'll continue to monitor closely. She understands that recovery from neuropathy is a slow process. -Overall stable, not resolved. We'll continue monitoring. -She has mild gait instability due to neuropathy and fatigue, I previously recommended her to get physical therapy; she is currently going to the gym twice a week. -I previously advised the patient to continue B12.   Plan -Continue breast cancer surveillance. -Mammogram to be scheduled soon -Lab and Follow up with me in 4 months.   All questions were answered. The patient knows to call the clinic with any problems, questions or concerns.  I spent 20  minutes counseling the patient face to face. The total time spent in the appointment was 25 minutes and more than 50% was on counseling.  This document serves as a record of services personally performed by Truitt Merle, MD. It was created on her behalf by Joslyn Devon, a trained medical scribe. The creation of this record is based on the scribe's personal observations and the provider's statements to them. This document has been checked and approved by the attending provider.     Truitt Merle, MD 11/04/2016

## 2016-10-29 DIAGNOSIS — Z961 Presence of intraocular lens: Secondary | ICD-10-CM | POA: Diagnosis not present

## 2016-11-04 ENCOUNTER — Encounter: Payer: Self-pay | Admitting: Hematology

## 2016-11-04 ENCOUNTER — Ambulatory Visit (HOSPITAL_BASED_OUTPATIENT_CLINIC_OR_DEPARTMENT_OTHER): Payer: Medicare Other | Admitting: Hematology

## 2016-11-04 ENCOUNTER — Other Ambulatory Visit (HOSPITAL_BASED_OUTPATIENT_CLINIC_OR_DEPARTMENT_OTHER): Payer: Medicare Other

## 2016-11-04 VITALS — BP 160/72 | Temp 97.8°F | Resp 18 | Ht 63.0 in | Wt 214.4 lb

## 2016-11-04 DIAGNOSIS — M069 Rheumatoid arthritis, unspecified: Secondary | ICD-10-CM | POA: Diagnosis not present

## 2016-11-04 DIAGNOSIS — G629 Polyneuropathy, unspecified: Secondary | ICD-10-CM

## 2016-11-04 DIAGNOSIS — D472 Monoclonal gammopathy: Secondary | ICD-10-CM

## 2016-11-04 DIAGNOSIS — Z853 Personal history of malignant neoplasm of breast: Secondary | ICD-10-CM | POA: Diagnosis not present

## 2016-11-04 DIAGNOSIS — D649 Anemia, unspecified: Secondary | ICD-10-CM | POA: Diagnosis not present

## 2016-11-04 DIAGNOSIS — Z171 Estrogen receptor negative status [ER-]: Principal | ICD-10-CM

## 2016-11-04 DIAGNOSIS — C50411 Malignant neoplasm of upper-outer quadrant of right female breast: Secondary | ICD-10-CM

## 2016-11-04 DIAGNOSIS — C50412 Malignant neoplasm of upper-outer quadrant of left female breast: Secondary | ICD-10-CM

## 2016-11-04 LAB — CBC WITH DIFFERENTIAL/PLATELET
BASO%: 1 % (ref 0.0–2.0)
Basophils Absolute: 0 10*3/uL (ref 0.0–0.1)
EOS%: 4.4 % (ref 0.0–7.0)
Eosinophils Absolute: 0.2 10*3/uL (ref 0.0–0.5)
HCT: 38.3 % (ref 34.8–46.6)
HGB: 12.2 g/dL (ref 11.6–15.9)
LYMPH#: 1 10*3/uL (ref 0.9–3.3)
LYMPH%: 25.2 % (ref 14.0–49.7)
MCH: 27.4 pg (ref 25.1–34.0)
MCHC: 31.9 g/dL (ref 31.5–36.0)
MCV: 85.9 fL (ref 79.5–101.0)
MONO#: 0.4 10*3/uL (ref 0.1–0.9)
MONO%: 9.5 % (ref 0.0–14.0)
NEUT#: 2.5 10*3/uL (ref 1.5–6.5)
NEUT%: 59.9 % (ref 38.4–76.8)
Platelets: 200 10*3/uL (ref 145–400)
RBC: 4.46 10*6/uL (ref 3.70–5.45)
RDW: 15 % — ABNORMAL HIGH (ref 11.2–14.5)
WBC: 4.1 10*3/uL (ref 3.9–10.3)

## 2016-11-04 LAB — COMPREHENSIVE METABOLIC PANEL
ALT: 10 U/L (ref 0–55)
AST: 22 U/L (ref 5–34)
Albumin: 3.7 g/dL (ref 3.5–5.0)
Alkaline Phosphatase: 95 U/L (ref 40–150)
Anion Gap: 9 mEq/L (ref 3–11)
BUN: 25.5 mg/dL (ref 7.0–26.0)
CHLORIDE: 107 meq/L (ref 98–109)
CO2: 28 meq/L (ref 22–29)
Calcium: 9.7 mg/dL (ref 8.4–10.4)
Creatinine: 1.1 mg/dL (ref 0.6–1.1)
EGFR: 57 mL/min/{1.73_m2} — ABNORMAL LOW (ref >90)
Glucose: 84 mg/dl (ref 70–140)
Potassium: 4.1 mEq/L (ref 3.5–5.1)
Sodium: 144 mEq/L (ref 136–145)
Total Bilirubin: 0.67 mg/dL (ref 0.20–1.20)
Total Protein: 7.2 g/dL (ref 6.4–8.3)

## 2016-11-05 LAB — KAPPA/LAMBDA LIGHT CHAINS
IG KAPPA FREE LIGHT CHAIN: 35.5 mg/L — AB (ref 3.3–19.4)
IG LAMBDA FREE LIGHT CHAIN: 12.8 mg/L (ref 5.7–26.3)
KAPPA/LAMBDA FLC RATIO: 2.77 — AB (ref 0.26–1.65)

## 2016-11-06 LAB — MULTIPLE MYELOMA PANEL, SERUM
ALBUMIN/GLOB SERPL: 1.3 (ref 0.7–1.7)
ALPHA2 GLOB SERPL ELPH-MCNC: 0.6 g/dL (ref 0.4–1.0)
Albumin SerPl Elph-Mcnc: 3.7 g/dL (ref 2.9–4.4)
Alpha 1: 0.2 g/dL (ref 0.0–0.4)
B-Globulin SerPl Elph-Mcnc: 1.5 g/dL — ABNORMAL HIGH (ref 0.7–1.3)
Gamma Glob SerPl Elph-Mcnc: 0.7 g/dL (ref 0.4–1.8)
Globulin, Total: 3 g/dL (ref 2.2–3.9)
IGA/IMMUNOGLOBULIN A, SERUM: 853 mg/dL — AB (ref 64–422)
IGG (IMMUNOGLOBIN G), SERUM: 723 mg/dL (ref 700–1600)
IGM (IMMUNOGLOBIN M), SRM: 53 mg/dL (ref 26–217)
M Protein SerPl Elph-Mcnc: 0.8 g/dL — ABNORMAL HIGH
TOTAL PROTEIN: 6.7 g/dL (ref 6.0–8.5)

## 2016-11-10 ENCOUNTER — Telehealth: Payer: Self-pay | Admitting: *Deleted

## 2016-11-10 NOTE — Telephone Encounter (Signed)
-----   Message from Truitt Merle, MD sent at 11/10/2016 12:18 AM EDT ----- Please call pt and let her know the lab results, the MGUS lab is stable, no concerns. Thanks.   Truitt Merle  11/10/2016

## 2016-11-10 NOTE — Telephone Encounter (Signed)
Called pt at home and spoke with daughter Julie [name].  Informed Julie [name] of pt's labs MGUS results stable, no concerns as per Dr. Ernestina Penna instructions.  Julie [name] voiced understanding and stated she would relay message to pt.

## 2016-11-11 ENCOUNTER — Ambulatory Visit
Admission: RE | Admit: 2016-11-11 | Discharge: 2016-11-11 | Disposition: A | Discharge status: Home / Self Care | Payer: Medicare Other | Source: Ambulatory Visit | Attending: Hematology | Admitting: Hematology

## 2016-11-11 DIAGNOSIS — R928 Other abnormal and inconclusive findings on diagnostic imaging of breast: Secondary | ICD-10-CM | POA: Diagnosis not present

## 2016-11-11 DIAGNOSIS — Z171 Estrogen receptor negative status [ER-]: Principal | ICD-10-CM

## 2016-11-11 DIAGNOSIS — C50411 Malignant neoplasm of upper-outer quadrant of right female breast: Secondary | ICD-10-CM

## 2016-11-12 ENCOUNTER — Telehealth: Payer: Self-pay | Admitting: *Deleted

## 2016-11-12 NOTE — Telephone Encounter (Signed)
FYI Refill denied at this time.  Returned call to [place] "Calling to inquire about refill for the Tramadol 50 mg she takes 1 every 6 hrs." Informed [facility] staff on 11-04-2016 patient reported to provider she is not taking this medication.  Provider's most recent Tramadol order was June 15, 2016.  Refill not authorized at this time.  This nurse noted expected plan is for patient to have rheumatologist and orthopedic surgeon follow up.

## 2016-12-25 DIAGNOSIS — R079 Chest pain, unspecified: Secondary | ICD-10-CM | POA: Diagnosis not present

## 2016-12-25 DIAGNOSIS — E7800 Pure hypercholesterolemia, unspecified: Secondary | ICD-10-CM | POA: Diagnosis not present

## 2017-01-09 IMAGING — DX DG LUMBAR SPINE COMPLETE 4+V
5 series · 5 of 5 positions shown · non-contrast
Comparison: 03/12/2016

CLINICAL DATA: Low back pain for 3 weeks, no known injury, initial
encounter

EXAM:
LUMBAR SPINE - COMPLETE 4+ VIEW

[l-spine ap]
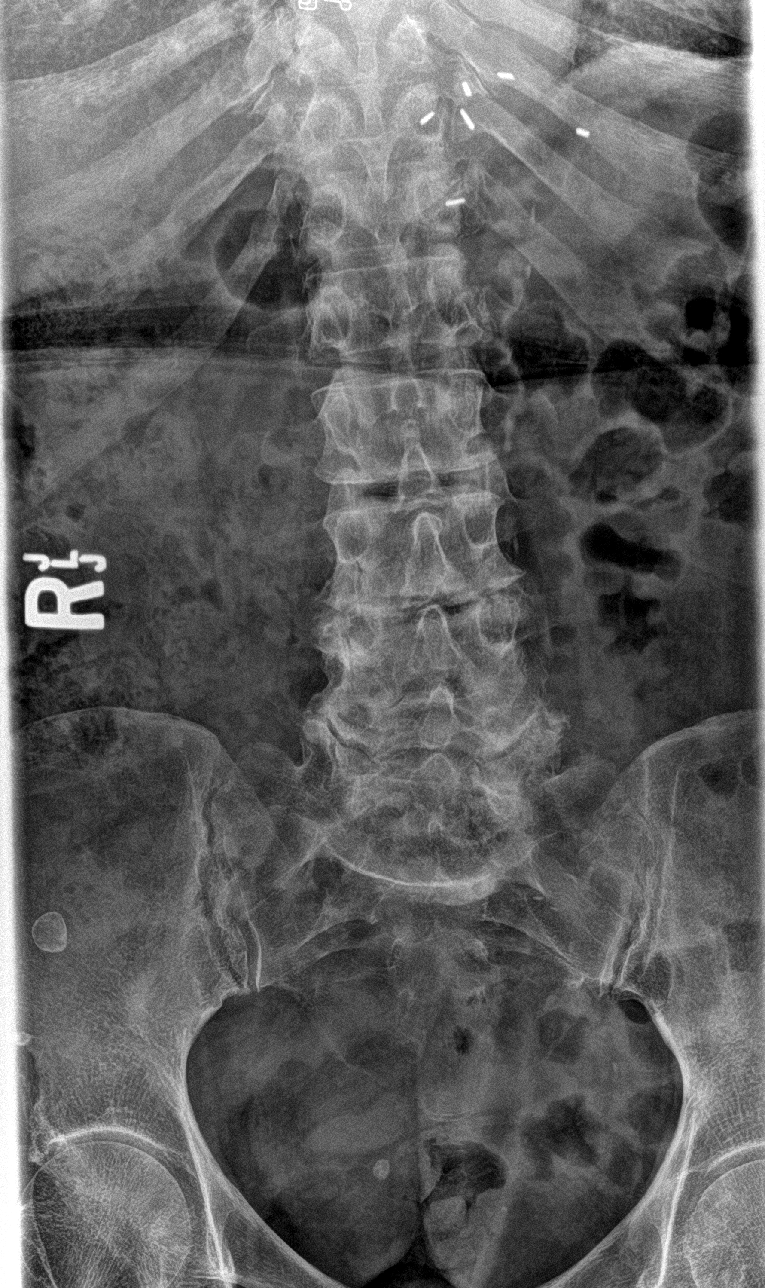

[l-spine obl (1 of 2)]
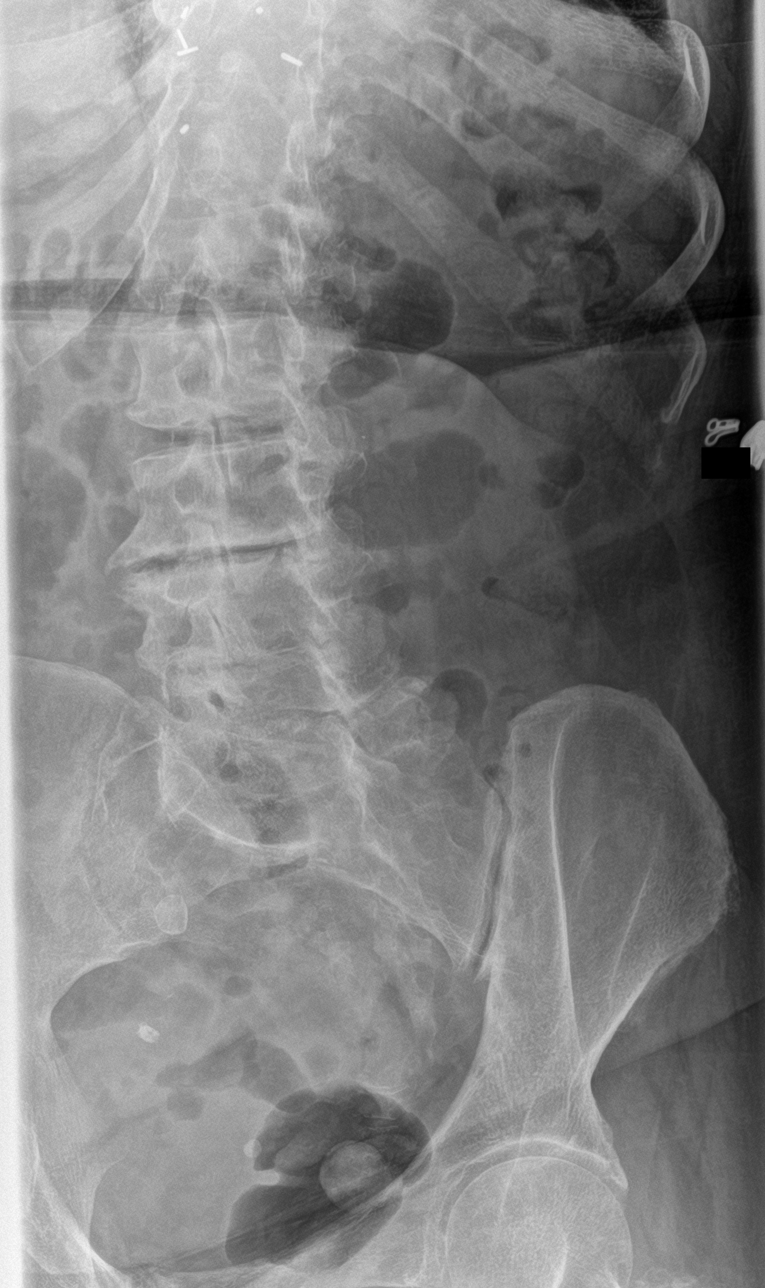

[l-spine obl (2 of 2)]
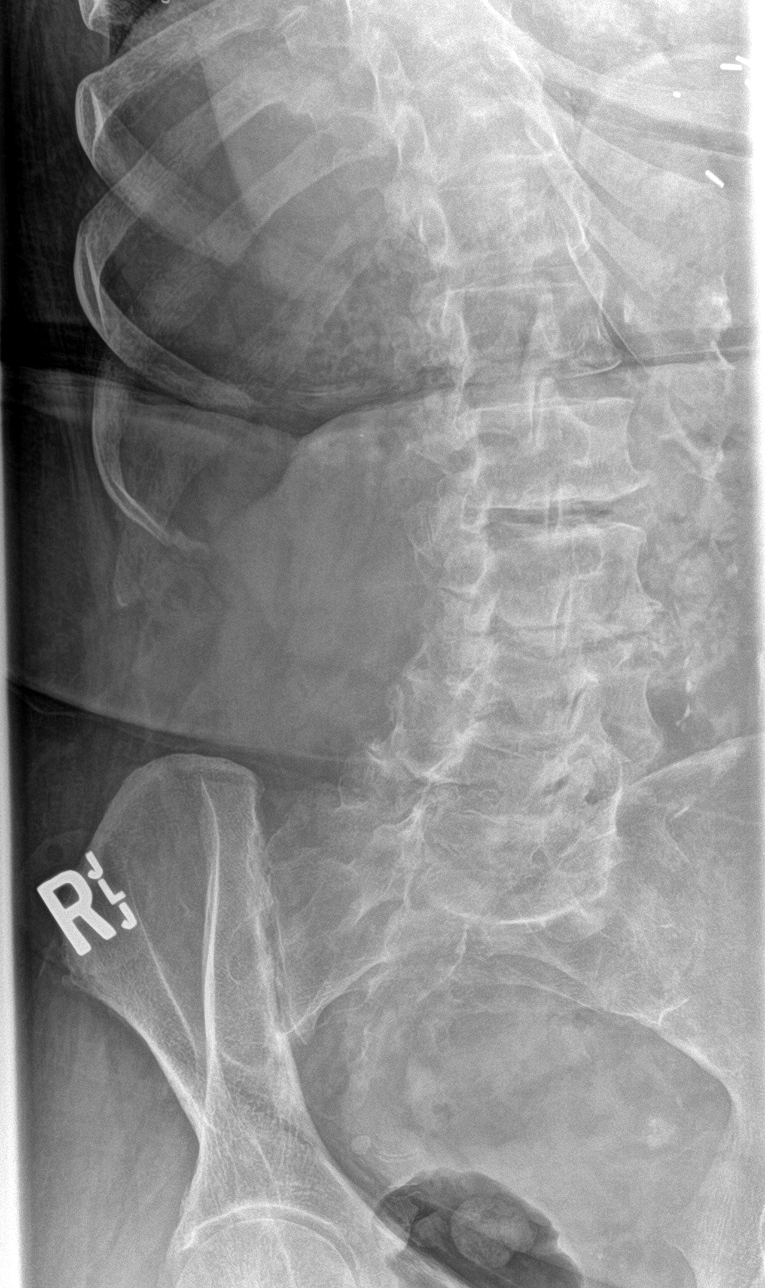

[l-spine lat]
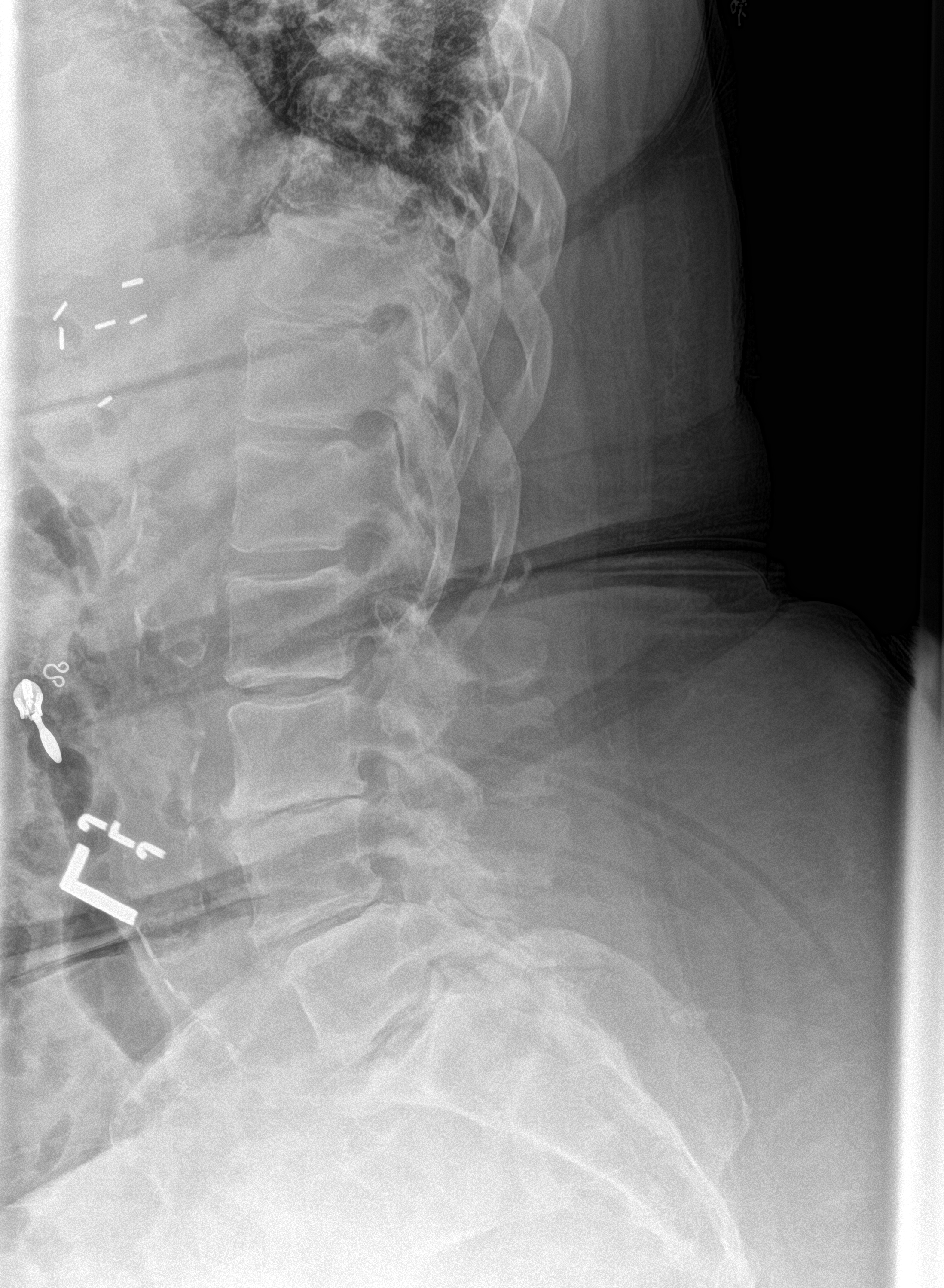

[l-spine spot]
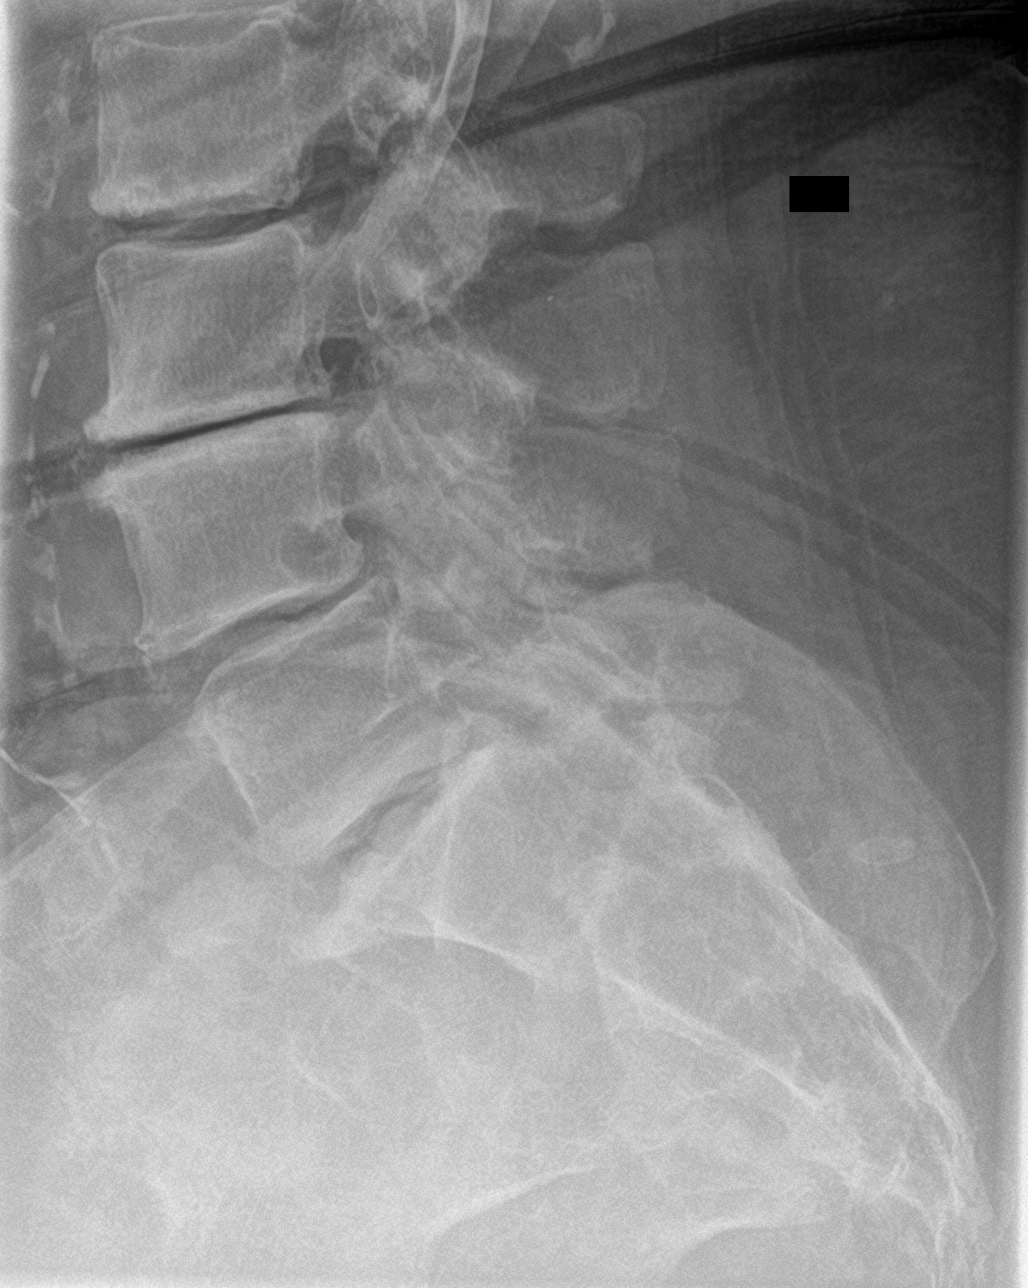

[5 of 5 positions shown; findings below may reference images not displayed]

FINDINGS: Five lumbar type vertebral bodies are again well visualized.
Vertebral body height is well maintained. Multilevel facet
hypertrophic changes are noted. Mild anterolisthesis of L4-5 of a
degenerative nature is again identified. Disc space narrowing is
noted from L2-S1. No significant soft tissue abnormality is noted
aside from aortic calcifications.
IMPRESSION: Multilevel degenerative change stable from the prior exam. No acute
abnormality noted.

## 2017-01-09 IMAGING — DX DG HIP (WITH OR WITHOUT PELVIS) 2-3V*L*
3 series · 3 of 3 positions shown · non-contrast
Comparison: None.

CLINICAL DATA: Left hip pain for several weeks, no known injury,
initial encounter

EXAM:
DG HIP (WITH OR WITHOUT PELVIS) 2-3V LEFT

[hip ap]
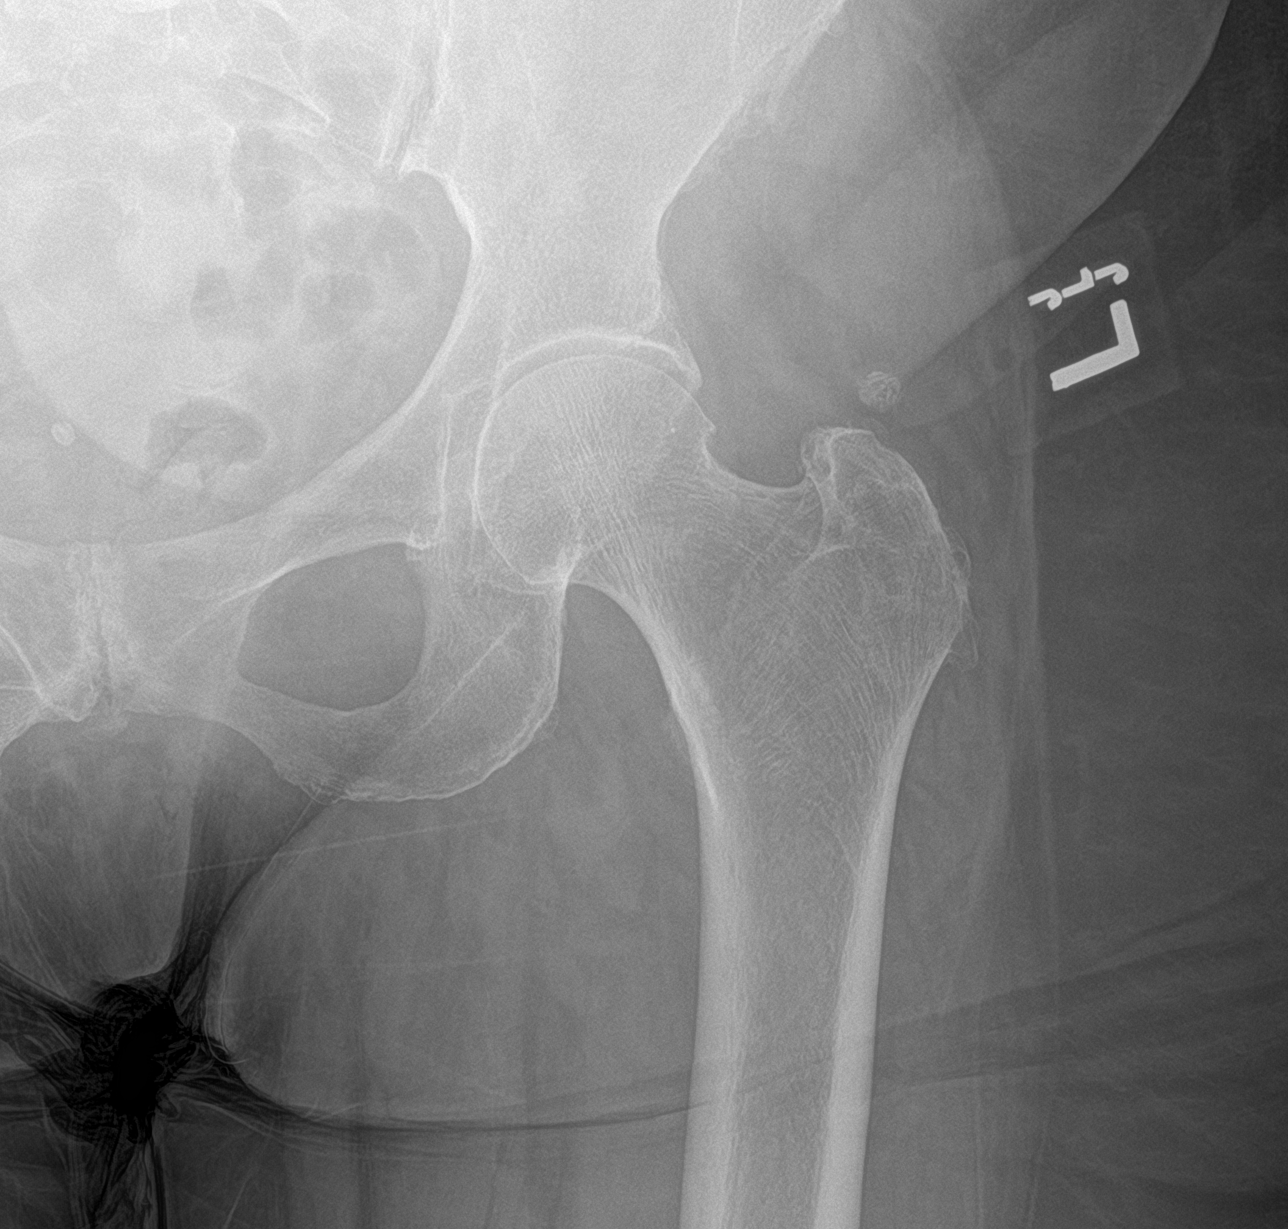

[hip lat]
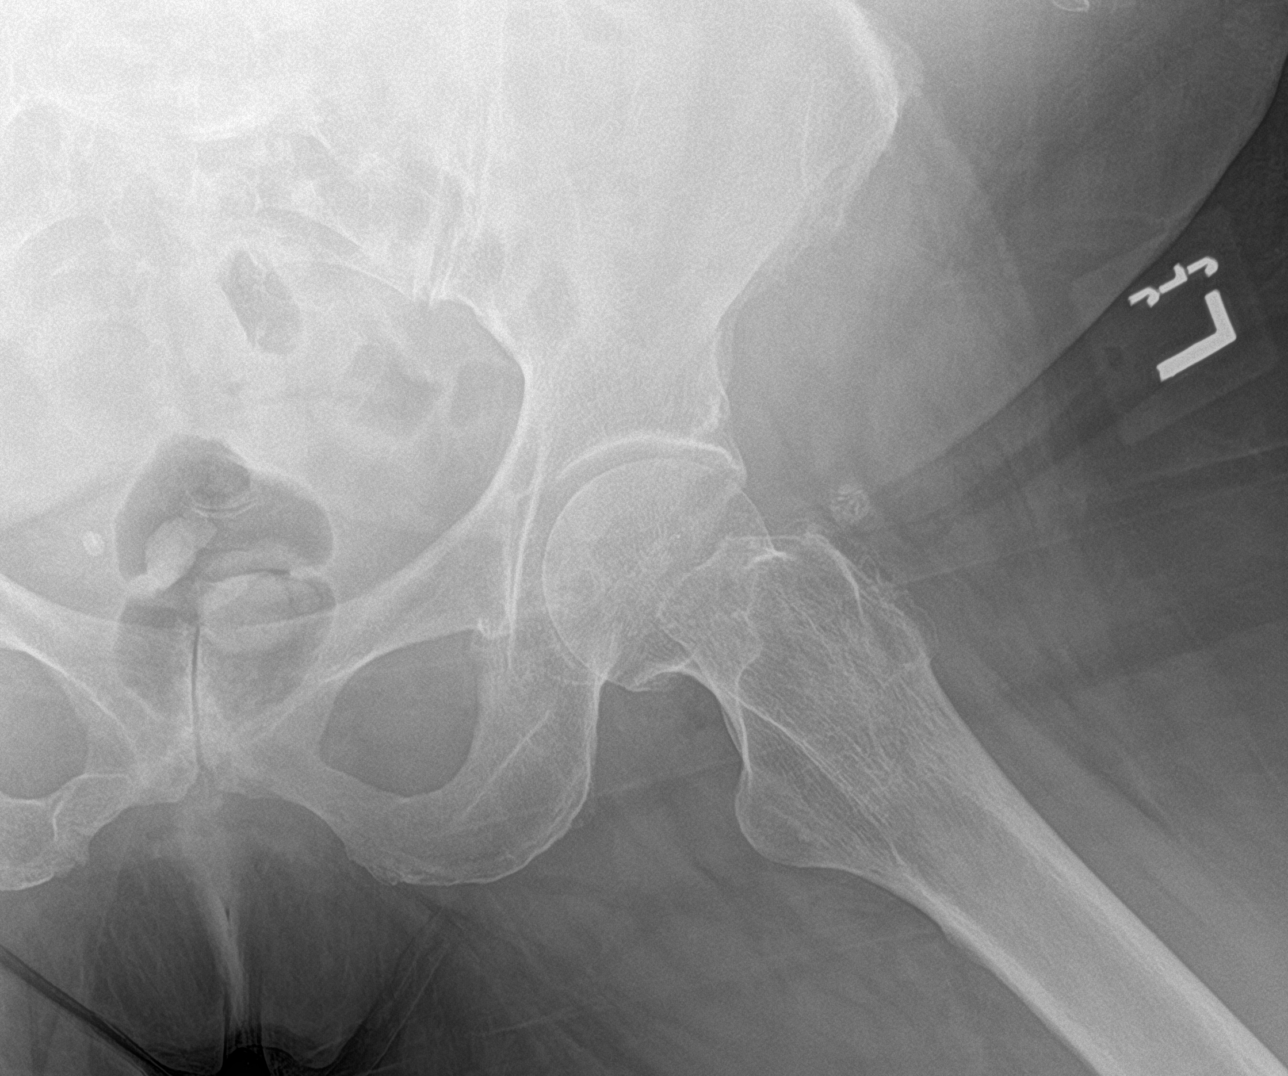

[pelvis ap]
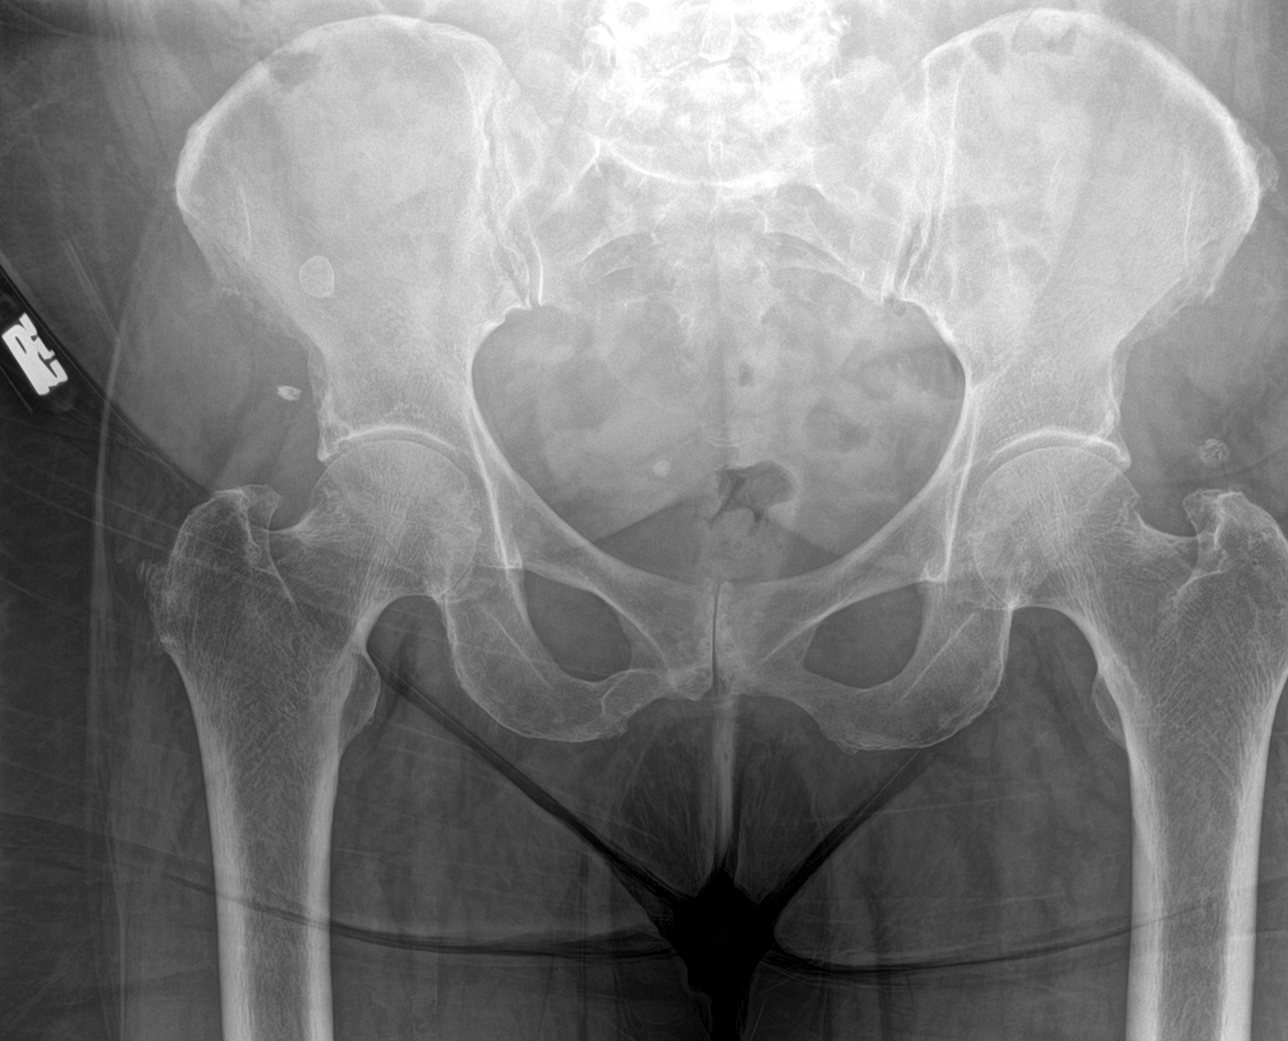

[3 of 3 positions shown; findings below may reference images not displayed]

FINDINGS: The pelvic ring is intact. Mild degenerative changes of the hip
joints are noted. Degenerative change of the pubic symphysis is
seen. No acute fracture or dislocation is noted. No soft tissue
abnormality is seen.
IMPRESSION: No acute abnormality noted.

## 2017-01-12 ENCOUNTER — Other Ambulatory Visit: Payer: Self-pay | Admitting: Neurology

## 2017-01-18 DIAGNOSIS — E785 Hyperlipidemia, unspecified: Secondary | ICD-10-CM | POA: Diagnosis not present

## 2017-01-18 DIAGNOSIS — E668 Other obesity: Secondary | ICD-10-CM | POA: Diagnosis not present

## 2017-01-18 DIAGNOSIS — I340 Nonrheumatic mitral (valve) insufficiency: Secondary | ICD-10-CM | POA: Diagnosis not present

## 2017-01-18 DIAGNOSIS — I10 Essential (primary) hypertension: Secondary | ICD-10-CM | POA: Diagnosis not present

## 2017-02-09 ENCOUNTER — Telehealth: Payer: Self-pay

## 2017-02-09 NOTE — Telephone Encounter (Signed)
Spoke with patients daughter [name]) about [facility] visit on 02/11/17.  She confirmed patient would be there.

## 2017-02-11 ENCOUNTER — Encounter: Payer: Self-pay | Admitting: Adult Health

## 2017-02-11 ENCOUNTER — Telehealth: Payer: Self-pay | Admitting: Hematology

## 2017-02-11 ENCOUNTER — Ambulatory Visit (HOSPITAL_BASED_OUTPATIENT_CLINIC_OR_DEPARTMENT_OTHER): Payer: Medicare Other | Admitting: Adult Health

## 2017-02-11 VITALS — BP 150/66 | HR 59 | Temp 98.2°F | Resp 18 | Ht 63.0 in | Wt 213.6 lb

## 2017-02-11 DIAGNOSIS — C50411 Malignant neoplasm of upper-outer quadrant of right female breast: Secondary | ICD-10-CM

## 2017-02-11 DIAGNOSIS — G629 Polyneuropathy, unspecified: Secondary | ICD-10-CM

## 2017-02-11 DIAGNOSIS — Z171 Estrogen receptor negative status [ER-]: Secondary | ICD-10-CM

## 2017-02-11 NOTE — Progress Notes (Signed)
CLINIC:  Survivorship   REASON FOR VISIT:  Routine follow-up post-treatment for a recent history of breast cancer.  BRIEF ONCOLOGIC HISTORY:  Oncology History   Malignant neoplasm of upper outer quadrant of female breast [facility])   Staging form: Breast, AJCC 7th Edition     Pathologic stage from 11/14/2015: Stage IA (T1c, N0, cM0) - Signed by Truitt Merle, MD on 11/25/2015     Clinical stage from 11/20/2015: Stage IA (T1c, N0, M0) - Signed by Curt Bears, MD on 11/20/2015       Malignant neoplasm of upper outer quadrant of female breast ([place])   10/09/2015 Mammogram    Screening bilateral mammograms showed a possible left breast mass. Diagnostic mammogram and ultrasound showed a 7 x 7 x 7 mm mass in the left breast at 11:00 position.      10/22/2015 Receptors her2    ER negative, PR negative, HER-2 not amplified      10/22/2015 Initial Biopsy    Left breast needle core biopsy at the 1:00 position showed invasive ductal carcinoma.      10/22/2015 Initial Diagnosis    Malignant neoplasm of upper outer quadrant of female breast ([place])      11/14/2015 Surgery    Left breast lumpectomy and sentinel lymph node biopsy [name])      11/14/2015 Pathology Results    Left breast lumpectomy showed invasive ductal carcinoma, grade 3, 1.1 cm, resection margins were negative, sentinel lymph node biopsy showed benign breast parenchyma, lymph node tissue not identified. Repeated ER PR and HER-2 were all negative.      12/20/2015 - 03/13/2016 Chemotherapy    Weekly Taxol 80 mg/m x 2 cycles, then changed to Abraxane 100 mg/m for cycles 3-12 due to infusion reaction. Abraxane dose-reduced cycle #9, then again for cycles #11 & #12 due to neuropathy. [total 12 cycles chemo]       04/01/2016 - 05/11/2016 Radiation Therapy    Adjuvant breast radiation ([place]). Left breast: 50.4 Gy in 28 fractions      06/19/2016 Imaging    MR Lumbar Spine without contrast  IMPRESSION: 1. Diffuse lumbar  spondylosis with discogenic and facet degenerative changes progressed from 2010 lumbar MRI. 2. Multilevel canal stenosis is mild-to-moderate at L2-3, moderate L3-4, and mild at L4-5. 3. Multiple levels of mild and moderate neural foraminal narrowing with severe foraminal narrowing at the right L3-4 and L4-5 levels.       INTERVAL HISTORY:  Ms. Pagliarulo presents to the Survivorship Clinic today for our initial meeting to review her survivorship care plan detailing her treatment course for breast cancer, as well as monitoring long-term side effects of that treatment, education regarding health maintenance, screening, and overall wellness and health promotion.     Overall, Ms. Mauri reports feeling quite well.  Her main issue is peripheral neuropathy.  It is worse in her toes.  She cannot wear close toed shoes, and has difficulty some nights with the sheets being over her feet and toes.  She also has numbness in the tips of her fingers. Sometimes the numbness is so significant that she cannot pull up her underwear while she is getting dressed.  She takes Lyrica 6m PO TID and is tolerating it well.  She says it helps, and her neuropathy is much worse if she misses a dose.  She says also since her chemo she has a decreased to minimized sense of smell.  She says that her hearing is worse, but this has been more  of an acute change over the past month.      REVIEW OF SYSTEMS:  Review of Systems  Constitutional: Negative for appetite change, chills, fatigue, fever and unexpected weight change.  HENT:   Negative for hearing loss and lump/mass.   Eyes: Negative for eye problems and icterus.  Respiratory: Negative for chest tightness, cough and shortness of breath.   Cardiovascular: Negative for chest pain, leg swelling and palpitations.  Gastrointestinal: Negative for abdominal distention, abdominal pain, constipation, diarrhea, nausea and vomiting.  Endocrine: Negative for hot flashes.    Genitourinary: Negative for difficulty urinating.   Musculoskeletal: Negative for arthralgias.  Skin: Negative for itching and rash.  Neurological: Negative for dizziness, extremity weakness and headaches.  Hematological: Negative for adenopathy. Does not bruise/bleed easily.  Psychiatric/Behavioral: Negative for depression. The patient is not nervous/anxious.   Breast: Denies any new nodularity, masses, tenderness, nipple changes, or nipple discharge.      ONCOLOGY TREATMENT TEAM:  1. Surgeon:  Dr[facility] [name] at [facility] 2. Medical Oncologist: Dr. Burr Medico  3. Radiation Oncologist: Dr. Sondra Come    PAST MEDICAL/SURGICAL HISTORY:  Past Medical History:  Diagnosis Date  . Anemia   . Anxiety    takes Klonopin at bedtime  . Arthritis    takes Methotrexate weekly and Prednisone  . Blood transfusion    no abnormal reaction noted  . Cataracts, bilateral    immature  . Complication of anesthesia    wakes up during surgery  . Depression   . Diabetes mellitus without complication ([place])   . Dyspnea    with exertion since chemo   . Fibromyalgia    takes Lyrica daily  . GERD (gastroesophageal reflux disease)    takes Nexium daily  . Glaucoma   . Hard of hearing   . Heart murmur   . Histoplasmosis   . History of colon polyps   . History of gout   . History of hiatal hernia   . History of shingles   . Hyperlipidemia   . Hypertension    takes Amlodipine,Metoprolol,and Losartan daily  . Incontinence of bowel   . Interstitial cystitis   . Joint pain   . Low back pain   . Nocturia   . Peripheral neuropathy   . Peripheral neuropathy   . PONV (postoperative nausea and vomiting)   . Ulcer 1970   duodenal  . Urinary frequency   . Urinary urgency   . Weakness    numbness in both hands and feet   Past Surgical History:  Procedure Laterality Date  . ABDOMINAL HYSTERECTOMY    . accupuncture  3 yrs ago  . ANKLE FUSION Left 05/10/2014   Procedure: LEFT ANKLE  ARTHRODESIS ;  Surgeon: Wylene Simmer, MD;  Location: [facility];  Service: Orthopedics;  Laterality: Left;  . ANKLE SURGERY Left   . arm surgery Left    radius  . bladder tacked     . CERVICAL FUSION    . CHOLECYSTECTOMY    . COLONOSCOPY    . CYSTOCELE REPAIR    . DILATION AND CURETTAGE OF UTERUS    . ESOPHAGOGASTRODUODENOSCOPY    . PORT A CATH REVISION N/A 12/27/2015   Procedure: PORT A CATH REVISION;  Surgeon: Rolm Bookbinder, MD;  Location: [facility];  Service: General;  Laterality: N/A;  . PORT-A-CATH REMOVAL N/A 03/25/2016   Procedure: REMOVAL PORT-A-CATH;  Surgeon: Rolm Bookbinder, MD;  Location: [facility];  Service: General;  Laterality: N/A;  . PORTACATH  PLACEMENT Right 12/27/2015   Procedure: INSERTION PORT-A-CATH WITH ULTRASOUND ;  Surgeon: Rolm Bookbinder, MD;  Location: [facility];  Service: General;  Laterality: Right;  . RADIOACTIVE SEED GUIDED MASTECTOMY WITH AXILLARY SENTINEL LYMPH NODE BIOPSY Left 11/14/2015   Procedure: RADIOACTIVE SEED GUIDED LEFT BREAST LUMPECTOMY WITH AXILLARY SENTINEL LYMPH NODE BIOPSY;  Surgeon: Rolm Bookbinder, MD;  Location: [facility];  Service: General;  Laterality: Left;  RADIOACTIVE SEED GUIDED LEFT BREAST LUMPECTOMY WITH AXILLARY SENTINEL LYMPH NODE BIOPSY  . SALPINGOOPHORECTOMY Bilateral   . TOTAL KNEE ARTHROPLASTY Right 02/28/2015   Procedure: RIGHT TOTAL KNEE ARTHROPLASTY;  Surgeon: Rod Can, MD;  Location: [facility];  Service: Orthopedics;  Laterality: Right;  . UPPER GASTROINTESTINAL ENDOSCOPY    . VAGINAL HYSTERECTOMY       ALLERGIES:  Allergies  Allergen Reactions  . Codeine Shortness Of Breath  . Other     Walnuts-anaphylaxis (patient denies allergy states that she just does not eat WALNUTS)  . Aspirin Other (See Comments)    Duodenal ulcer- cannot take aspirin when active.  HYPERSENSITIVE  TO  REGULAR DOSE.       CURRENT MEDICATIONS:  Outpatient Encounter Prescriptions as of  02/11/2017  Medication Sig Note  . acetaminophen (TYLENOL) 500 MG tablet Take 500 mg by mouth every 6 (six) hours as needed (for pain/fever.).    [name] amLODipine (NORVASC) 10 MG tablet Take 10 mg by mouth every morning.    . Biotin 10000 MCG TABS Take 1 tablet by mouth daily.   . calcium-vitamin D (OSCAL WITH D) 500-200 MG-UNIT per tablet Take 1 tablet by mouth every morning.   [name] doxazosin (CARDURA) 1 MG tablet Take 1 mg by mouth daily.   [name] ezetimibe (ZETIA) 10 MG tablet Take 10 mg by mouth daily.    . fish oil-omega-3 fatty acids 1000 MG capsule Take 1 g by mouth every morning.    . folic acid (FOLVITE) 1 MG tablet Take 2 mg by mouth every morning.    . hydroxypropyl methylcellulose / hypromellose (ISOPTO TEARS / GONIOVISC) 2.5 % ophthalmic solution Place 1 drop into both eyes daily as needed for dry eyes.   . Lidocaine 2 % GEL Apply 1 g topically as needed.   [name] LYRICA 75 MG capsule TAKE ONE CAPSULE BY MOUTH 3 TIMES A DAY   . meloxicam (MOBIC) 15 MG tablet Take 15 mg by mouth daily.   . metFORMIN (GLUCOPHAGE) 500 MG tablet Take 500 mg by mouth daily.    . metoprolol succinate (TOPROL-XL) 50 MG 24 hr tablet TAKE 1 TABLET AT BEDTIME ONCE A DAY ORALLY 90 DAYS   . pantoprazole (PROTONIX) 40 MG tablet Take 1 tablet (40 mg total) by mouth daily.   . pravastatin (PRAVACHOL) 10 MG tablet Take 10 mg by mouth daily.   . traMADol (ULTRAM) 50 MG tablet TAKE 1 TABLET BY MOUTH EVERY 6 TO 8 HOURS AS NEEDED FOR 30 DAYS (Patient not taking: Reported on 11/04/2016)   . valsartan-hydrochlorothiazide (DIOVAN-HCT) 320-12.5 MG per tablet Take 1 tablet by mouth daily. 02/14/2015: Takes with amlodipine   . vitamin B-12 (CYANOCOBALAMIN) 1000 MCG tablet Take 1,000 mcg by mouth daily.   . vitamin E 400 UNIT capsule Take 400 Units by mouth daily.    No facility-administered encounter medications on file as of 02/11/2017.      ONCOLOGIC FAMILY HISTORY:  Family History  Problem Relation Age of Onset  . Colon cancer Sister  35  . Esophageal cancer  Brother 13  . Breast cancer Mother   . Brain cancer Other      GENETIC COUNSELING/TESTING: Not indicated at this time  SOCIAL HISTORY:  MONIKE BRAGDON is divorced and lives with her youngest daughter Asencion Partridge, [place].  She has 3 children, all daughters, [name] lives in [place], and [place] in [place].  Ms. Ikner is currently retired.  She denies any current or history of tobacco, alcohol, or illicit drug use.     PHYSICAL EXAMINATION:  Vital Signs:   Vitals:   02/11/17 1035  BP: (!) 150/66  Pulse: (!) 59  Resp: 18  Temp: 98.2 F (36.8 C)  SpO2: 99%   Filed Weights   02/11/17 1035  Weight: 213 lb 9.6 oz (96.9 kg)   General: Well-nourished, well-appearing female in no acute distress.  She is accompanied in clinic by her daughter today.   HEENT: Head is normocephalic.  Pupils equal and reactive to light. Conjunctivae clear without exudate.  Sclerae anicteric. Oral mucosa is pink, moist.  Oropharynx is pink without lesions or erythema. Ear canals are impacted with wax Lymph: No cervical, supraclavicular, or infraclavicular lymphadenopathy noted on palpation.  Cardiovascular: Regular rate and rhythm.[name] Respiratory: Clear to auscultation bilaterally. Chest expansion symmetric; breathing non-labored.  GI: Abdomen soft and round; non-tender, non-distended. Bowel sounds normoactive.  GU: Deferred.  Neuro: No focal deficits. Steady gait.  Psych: Mood and affect normal and appropriate for situation.  Extremities: No edema. MSK: No focal spinal tenderness to palpation.  Full range of motion in bilateral upper extremities Skin: Warm and dry.  LABORATORY DATA:  None for this visit.  DIAGNOSTIC IMAGING:  None for this visit.      ASSESSMENT AND PLAN:  Ms.. Villaflor is a pleasant 83 y.o. female with Stage IA left breast invasive ductal carcinoma, ER-/PR-/HER2-, diagnosed in 09/2015, treated with lumpectomy, adjuvant chemotherapy, and adjuvant  radiation therapy.  She presents to the Survivorship Clinic for our initial meeting and routine follow-up post-completion of treatment for breast cancer.    1. Stage IA left breast cancer:  Ms. Lazcano is continuing to recover from definitive treatment for breast cancer. She will follow-up with her medical oncologist, Dr. Burr Medico in 02/2017 with history and physical exam per surveillance protocol.  Today, a comprehensive survivorship care plan and treatment summary was reviewed with the patient today detailing her breast cancer diagnosis, treatment course, potential late/long-term effects of treatment, appropriate follow-up care with recommendations for the future, and patient education resources.  A copy of this summary, along with a letter will be sent to the patient's primary care provider via mail/fax/In Basket message after today's visit.    2. Peripheral Neuropathy: This is very significant and severe for [facility].  She will continue taking her lyrica.  She is managing the neuropathy well and is keeping a positive attitude about it.    3. Hearing and smell changes:  I looked in Sameera's ears and they are significantly impacted with cerumen.  I recommended she see her pcp to get her ears cleaned.  I did recommend she see ENT about her sense of smell.    4. Bone health:  Given Ms. Capps's age/history of breast cancer, she is at risk for bone demineralization.  I will defer to her PCP regarding bone density testing and management.  She was given education on specific activities to promote bone health.  5. Cancer screening:  Due to Ms. Giraud's history and her age, she should receive screening for skin cancers, colon  cancer, and gynecologic cancers.  The information and recommendations are listed on the patient's comprehensive care plan/treatment summary and were reviewed in detail with the patient.    6. Health maintenance and wellness promotion: Ms. Rozak was encouraged to consume 5-7 servings of  fruits and vegetables per day. We reviewed the "Nutrition Rainbow" handout, as well as the handout "Take Control of Your Health and Reduce Your Cancer Risk" from the [facility].  She was also encouraged to engage in moderate to vigorous exercise for 30 minutes per day most days of the week. We discussed the [employer] fitness program, which is designed for cancer survivors to help them become more physically fit after cancer treatments.  She was instructed to limit her alcohol consumption and continue to abstain from tobacco use.     7. Support services/counseling: It is not uncommon for this period of the patient's cancer care trajectory to be one of many emotions and stressors.  We discussed an opportunity for her to participate in the next session of [facility] ("Finding Your New Normal") support group series designed for patients after they have completed treatment.   Ms. Spark was encouraged to take advantage of our many other support services programs, support groups, and/or counseling in coping with her new life as a cancer survivor after completing anti-cancer treatment.  She was offered support today through active listening and expressive supportive counseling.  She was given information regarding our available services and encouraged to contact me with any questions or for help enrolling in any of our support group/programs.    Dispo:   -Return to cancer center in 02/2018 for follow up with Dr. Burr Medico  -Mammogram due in 10/2017 -Follow up with Dr. [name] at [facility] in 06/2017 -She is welcome to return back to the Survivorship Clinic at any time; no additional follow-up needed at this time.  -Consider referral back to survivorship as a long-term survivor for continued surveillance  A total of (30) minutes of face-to-face time was spent with this patient with greater than 50% of that time in counseling and care-coordination.   Gardenia Phlegm,  [place] [phone]   Note: PRIMARY CARE PROVIDER [name], [place] [phone]

## 2017-02-11 NOTE — Telephone Encounter (Signed)
Gave patient avs and calendar with upcated dates

## 2017-02-18 ENCOUNTER — Telehealth: Payer: Self-pay | Admitting: *Deleted

## 2017-02-18 NOTE — Telephone Encounter (Signed)
Spoke with pt and gave pt appts for  Tues 02/23/17  -  Lab 0945 am, office visit with Dr. Burr Medico at 1015 am.  Pt voiced understanding.

## 2017-02-18 NOTE — Telephone Encounter (Signed)
Pt called requested a call back from nurse.  Spoke with pt and was informed that pt experienced lower back pain and both wrists pain for about 3 weeks.  Stated she did not inform Mendel Ryder, NP about the pain at her last survivorship visit on 02/11/17.   Pt stated she is still able to walk but has more pain at time;  Able to use both hands fine.  Pt wanted to know if it is ok to wait until next office visit with Dr. Burr Medico on  03/04/17 ,  Or should pt need to be seen sooner. Pt's    Phone      [phone].

## 2017-02-19 ENCOUNTER — Telehealth: Payer: Self-pay | Admitting: Hematology

## 2017-02-19 DIAGNOSIS — Z23 Encounter for immunization: Secondary | ICD-10-CM | POA: Diagnosis not present

## 2017-02-19 DIAGNOSIS — H6123 Impacted cerumen, bilateral: Secondary | ICD-10-CM | POA: Diagnosis not present

## 2017-02-19 NOTE — Progress Notes (Signed)
[place]  [phone] Fax:([phone]) [phone]  Clinic follow up Note   Patient Care Team: [name], MD as PCP - General (Internal Medicine) Valinda Party, MD as Referring Physician (Rheumatology) Rolm Bookbinder, MD as Consulting Physician (General Surgery) Truitt Merle, MD as Consulting Physician (Hematology) Gardenia Phlegm, NP as Nurse Practitioner (Hematology and Oncology) 02/23/2017  CHIEF COMPLAINTS:  Follow up left breast cancer   Oncology History   Malignant neoplasm of upper outer quadrant of female breast [facility])   Staging form: Breast, AJCC 7th Edition     Pathologic stage from 11/14/2015: Stage IA (T1c, N0, cM0) - Signed by Truitt Merle, MD on 11/25/2015     Clinical stage from 11/20/2015: Stage IA (T1c, N0, M0) - Signed by Curt Bears, MD on 11/20/2015       Malignant neoplasm of upper outer quadrant of female breast ([place])   10/09/2015 Mammogram    Screening bilateral mammograms showed a possible left breast mass. Diagnostic mammogram and ultrasound showed a 7 x 7 x 7 mm mass in the left breast at 11:00 position.      10/22/2015 Receptors her2    ER negative, PR negative, HER-2 not amplified      10/22/2015 Initial Biopsy    Left breast needle core biopsy at the 1:00 position showed invasive ductal carcinoma.      10/22/2015 Initial Diagnosis    Malignant neoplasm of upper outer quadrant of female breast ([place])      11/14/2015 Surgery    Left breast lumpectomy and sentinel lymph node biopsy [name])      11/14/2015 Pathology Results    Left breast lumpectomy showed invasive ductal carcinoma, grade 3, 1.1 cm, resection margins were negative, sentinel lymph node biopsy showed benign breast parenchyma, lymph node tissue not identified. Repeated ER PR and HER-2 were all negative.      12/20/2015 - 03/13/2016 Chemotherapy    Weekly Taxol 80 mg/m x 2 cycles, then changed to Abraxane 100 mg/m for cycles 3-12 due to infusion  reaction. Abraxane dose-reduced cycle #9, then again for cycles #11 & #12 due to neuropathy. [total 12 cycles chemo]       04/01/2016 - 05/11/2016 Radiation Therapy    Adjuvant breast radiation ([place]). Left breast: 50.4 Gy in 28 fractions      06/19/2016 Imaging    MR Lumbar Spine without contrast  IMPRESSION: 1. Diffuse lumbar spondylosis with discogenic and facet degenerative changes progressed from 2010 lumbar MRI. 2. Multilevel canal stenosis is mild-to-moderate at L2-3, moderate L3-4, and mild at L4-5. 3. Multiple levels of mild and moderate neural foraminal narrowing with severe foraminal narrowing at the right L3-4 and L4-5 levels.       HISTORY OF PRESENTING ILLNESS:  Julie [name] 83 y.o. female is here because of her recently diagnosed left breast cancer. She has been seen my partner Dr. Julien Nordmann for MGUS, and was referred to me to discuss her adjuvant therapy for breast cancer. She is accompanied by her daughter to the clinic today.  Her breast cancer was discovered by screening mammogram. She has no palpable breast mass, skin change or nipple discharge. She denies any new constitutional symptoms. The diagnostic mammogram and ultrasound showed a 7 mm mass in the left breast, biopsy showed invasive ductal carcinoma, triple negative. She was seen by breast surgeon Dr. [name], and underwent left rest lumpectomy and sentinel lymph node biopsy on 11/14/2015.   She had a multiple orthopedic surgeries in the  past, takes tramadol as needed. She lives with her daughter, pretty independent, does not exercise regularly, but it goes out for shopping, playing cards, etc.  GYN HISTORY  Menarchal: 16 LMP: 52 (hysterectomy) Contraceptive: 2 HRT: 1 yr  G5P5: she did breast feeding to 2 children   CURRENT THERAPY: Surveillance  INTERIM HISTORY:  [name] returns for follow-up. She presents to the clinic today with her daughter.  She reports her chronic back ache is bad and has been  significantly worse for the past 3 weeks. The pain radiated up to the front of her abdomen and down to her buttocks. She has a high threshold of pain so it was significant for her to be in such pain. It had hurt even if she talked. The pain is starting to subside now. Heat helped subside temporarily. She did not take any medication for the pain. The occasional back pain will come and go. She wants to know where she can go for acupuncture.   She notes to still having redidual effects from chemo and radiation. She has joint pain in her bilateral wrists. She gets bilateral shooting breast pain still from surgery and her left breast will feel hard from radiation.  She notes her taste buds had came back but then she lost her since of taste again. She will discuss with her neurologist why her taste buds are gone. Her nails are pushing out and hurt when growing out. She does not know if her Lyrica is helping her. Her handwriting is legible and she has been taking B12 complex that helps. Her daughter reports to change in memory. She has started to forget things more like what she ate for breakfast.  She reports to having already gotten her Flu shot for the year.      MEDICAL HISTORY:  Past Medical History:  Diagnosis Date  . Anemia   . Anxiety    takes Klonopin at bedtime  . Arthritis    takes Methotrexate weekly and Prednisone  . Blood transfusion    no abnormal reaction noted  . Cataracts, bilateral    immature  . Complication of anesthesia    wakes up during surgery  . Depression   . Diabetes mellitus without complication ([place])   . Dyspnea    with exertion since chemo   . Fibromyalgia    takes Lyrica daily  . GERD (gastroesophageal reflux disease)    takes Nexium daily  . Glaucoma   . Hard of hearing   . Heart murmur   . Histoplasmosis   . History of colon polyps   . History of gout   . History of hiatal hernia   . History of shingles   . Hyperlipidemia   . Hypertension    takes  Amlodipine,Metoprolol,and Losartan daily  . Incontinence of bowel   . Interstitial cystitis   . Joint pain   . Low back pain   . Nocturia   . Peripheral neuropathy   . Peripheral neuropathy   . PONV (postoperative nausea and vomiting)   . Ulcer 1970   duodenal  . Urinary frequency   . Urinary urgency   . Weakness    numbness in both hands and feet    SURGICAL HISTORY: Past Surgical History:  Procedure Laterality Date  . ABDOMINAL HYSTERECTOMY    . accupuncture  3 yrs ago  . ANKLE FUSION Left 05/10/2014   Procedure: LEFT ANKLE ARTHRODESIS ;  Surgeon: Wylene Simmer, MD;  Location: [facility];  Service: Orthopedics;  Laterality: Left;  . ANKLE SURGERY Left   . arm surgery Left    radius  . bladder tacked     . CERVICAL FUSION    . CHOLECYSTECTOMY    . COLONOSCOPY    . CYSTOCELE REPAIR    . DILATION AND CURETTAGE OF UTERUS    . ESOPHAGOGASTRODUODENOSCOPY    . PORT A CATH REVISION N/A 12/27/2015   Procedure: PORT A CATH REVISION;  Surgeon: Rolm Bookbinder, MD;  Location: [facility];  Service: General;  Laterality: N/A;  . PORT-A-CATH REMOVAL N/A 03/25/2016   Procedure: REMOVAL PORT-A-CATH;  Surgeon: Rolm Bookbinder, MD;  Location: [facility];  Service: General;  Laterality: N/A;  . PORTACATH PLACEMENT Right 12/27/2015   Procedure: INSERTION PORT-A-CATH WITH ULTRASOUND ;  Surgeon: Rolm Bookbinder, MD;  Location: [facility];  Service: General;  Laterality: Right;  . RADIOACTIVE SEED GUIDED MASTECTOMY WITH AXILLARY SENTINEL LYMPH NODE BIOPSY Left 11/14/2015   Procedure: RADIOACTIVE SEED GUIDED LEFT BREAST LUMPECTOMY WITH AXILLARY SENTINEL LYMPH NODE BIOPSY;  Surgeon: Rolm Bookbinder, MD;  Location: [facility];  Service: General;  Laterality: Left;  RADIOACTIVE SEED GUIDED LEFT BREAST LUMPECTOMY WITH AXILLARY SENTINEL LYMPH NODE BIOPSY  . SALPINGOOPHORECTOMY Bilateral   . TOTAL KNEE ARTHROPLASTY Right 02/28/2015   Procedure: RIGHT TOTAL KNEE  ARTHROPLASTY;  Surgeon: Rod Can, MD;  Location: [facility];  Service: Orthopedics;  Laterality: Right;  . UPPER GASTROINTESTINAL ENDOSCOPY    . VAGINAL HYSTERECTOMY      SOCIAL HISTORY: Social History   Social History  . Marital status: Divorced    Spouse name: N/A  . Number of children: 5  . Years of education: some college   Occupational History  . Retired    Social History Main Topics  . Smoking status: Never Smoker  . Smokeless tobacco: Never Used  . Alcohol use No     Comment: IN [place] ONLY  . Drug use: No  . Sexual activity: Not on file   Other Topics Concern  . Not on file   Social History Narrative   Lives at home with her daughter.   Right-handed.   0.5 cup caffeine per day.    FAMILY HISTORY: Family History  Problem Relation Age of Onset  . Colon cancer Sister 77  . Esophageal cancer Brother 23  . Breast cancer Mother   . Brain cancer Other     ALLERGIES:  is allergic to codeine; other; and aspirin.  MEDICATIONS:  Current Outpatient Prescriptions  Medication Sig Dispense Refill  . acetaminophen (TYLENOL) 500 MG tablet Take 500 mg by mouth every 6 (six) hours as needed (for pain/fever.).     [name] amLODipine (NORVASC) 10 MG tablet Take 10 mg by mouth every morning.     . Biotin 10000 MCG TABS Take 1 tablet by mouth daily.    . calcium-vitamin D (OSCAL WITH D) 500-200 MG-UNIT per tablet Take 1 tablet by mouth every morning.    [name] doxazosin (CARDURA) 1 MG tablet Take 1 mg by mouth daily.  1  . ezetimibe (ZETIA) 10 MG tablet Take 10 mg by mouth daily.     . fish oil-omega-3 fatty acids 1000 MG capsule Take 1 g by mouth every morning.     . folic acid (FOLVITE) 1 MG tablet Take 2 mg by mouth every morning.     [name] FREESTYLE LITE test strip TEST ONCE EVERY DAY  4  . hydroxypropyl methylcellulose / hypromellose (ISOPTO TEARS / GONIOVISC) 2.5 %  ophthalmic solution Place 1 drop into both eyes daily as needed for dry eyes.    . Lidocaine 2 % GEL Apply 1 g  topically as needed. 2 Tube 11  . lovastatin (MEVACOR) 40 MG tablet 1 (ONE) TABLET TAKE IN THE EVENING AFTER DINNER  3  . LYRICA 75 MG capsule TAKE ONE CAPSULE BY MOUTH 3 TIMES A DAY 90 capsule 5  . meloxicam (MOBIC) 15 MG tablet Take 15 mg by mouth daily.  11  . metFORMIN (GLUCOPHAGE) 500 MG tablet Take 500 mg by mouth daily.     . metoprolol succinate (TOPROL-XL) 50 MG 24 hr tablet TAKE 1 TABLET AT BEDTIME ONCE A DAY ORALLY 90 DAYS  2  . pravastatin (PRAVACHOL) 20 MG tablet Take 20 mg by mouth daily.  3  . traMADol (ULTRAM) 50 MG tablet TAKE 1 TABLET BY MOUTH EVERY 6 TO 8 HOURS AS NEEDED FOR 30 DAYS 60 tablet 0  . valsartan-hydrochlorothiazide (DIOVAN-HCT) 320-12.5 MG per tablet Take 1 tablet by mouth daily.    . vitamin B-12 (CYANOCOBALAMIN) 1000 MCG tablet Take 1,000 mcg by mouth daily.    . vitamin E 400 UNIT capsule Take 400 Units by mouth daily.     No current facility-administered medications for this visit.     REVIEW OF SYSTEMS:  Constitutional: Denies fevers, chills or abnormal night sweats, (+) fatigue Eyes: Denies blurriness of vision, double vision or watery eyes Ears, nose, mouth, throat, and face: Denies mucositis or sore throat Respiratory: Denies cough, dyspnea or wheezes Cardiovascular: Denies palpitation, chest discomfort or lower extremity swelling Gastrointestinal:  Denies nausea, heartburn or change in bowel habits Skin: Denies abnormal skin rashes (+) Painful nail growth Musculoskeletal: (+) chronic back pain (+) Bilateral wrist pain Lymphatics: Denies new lymphadenopathy or easy bruising Neurological: (+) neuropathy with numbness to her fingers and improvement in toes (+) lost sence of taste (+) Short term memeory change Behavioral/Psych: Mood is stable, no new changes. Breast: (+) occasional shooting pains All other systems were reviewed with the patient and are negative. (+) skin discoloration from radiation  PHYSICAL EXAMINATION: ECOG PERFORMANCE STATUS: 1  - Symptomatic but completely ambulatory  Vitals:   02/23/17 1049  BP: (!) 170/77  Pulse: (!) 59  Resp: 20  Temp: 98.2 F (36.8 C)  TempSrc: Oral  SpO2: 99%  Weight: 213 lb 9.6 oz (96.9 kg)  Height: _0  (1.6 m)    GENERAL:alert, no distress and comfortable. SKIN: skin color, texture, turgor are normal, no rashes or significant lesions. EYES: normal, conjunctiva are pink and non-injected, sclera clear OROPHARYNX:no exudate, no erythema and lips, buccal mucosa, and tongue normal  NECK: supple, thyroid normal size, non-tender, without nodularity LYMPH:  no palpable lymphadenopathy in the cervical, axillary or inguinal LUNGS: clear to auscultation and percussion with normal breathing effort HEART: regular rate & rhythm and no murmurs and no lower extremity edema ABDOMEN:abdomen soft, non-tender and normal bowel sounds MUSCULOSKELETAL:no cyanosis of digits and no clubbing. Ambulatory with a wheelchair and cane. PSYCH: alert & oriented x 3 with fluent speech NEURO: no focal motor/sensory deficits Breasts: (+) Breast inspection showed them to be symmetrical with no nipple discharge. Palpation of the breasts and axilla revealed no obvious mass that I could appreciate.(+) Scar in UOQ next to nipple in incision of left breast, non-tender. incision in left axilla is well healed. (+) Skin hyperpigmentation from radiation    LABORATORY DATA:  I have reviewed the data as listed CBC Latest Ref Rng & Units  02/23/2017 11/04/2016 07/08/2016  WBC 3.9 - 10.3 10e3/uL 4.3 4.1 4.0  Hemoglobin 11.6 - 15.9 g/dL 11.9 12.2 11.6  Hematocrit 34.8 - 46.6 % 36.6 38.3 35.8  Platelets 145 - 400 10e3/uL 184 200 209   CMP Latest Ref Rng & Units 02/23/2017 11/04/2016 11/04/2016  Glucose 70 - 140 mg/dl 115 84 -  BUN 7.0 - 26.0 mg/dL 23.5 25.5 -  Creatinine 0.6 - 1.1 mg/dL 0.9 1.1 -  Sodium 136 - 145 mEq/L 144 144 -  Potassium 3.5 - 5.1 mEq/L 3.8 4.1 -  Chloride 101 - 111 mmol/L - - -  CO2 22 - 29 mEq/L 29 28 -    Calcium 8.4 - 10.4 mg/dL 9.4 9.7 -  Total Protein 6.4 - 8.3 g/dL 6.8 7.2 6.7  Total Bilirubin 0.20 - 1.20 mg/dL 0.71 0.67 -  Alkaline Phos 40 - 150 U/L 84 95 -  AST 5 - 34 U/L 21 22 -  ALT 0 - 55 U/L 11 10 -    SPEP M-protein (g/dl) 02/10/2012: 0.74 12/20/2015: 0.9 03/06/2016: 0.4 07/08/2016: 0.8 11/04/16: 0.8 02/23/17: PENDING    IgA (mg/dl) 02/10/2012: 822 12/20/2015: 783 03/06/2016: 668 07/08/2016: 763 11/04/16: 853 02/23/17: PENDING    Kappa, lambda light chain (mg/l) and ration  02/10/2012: 14.3, 16.3, 0.88 12/20/2015: 40.3, 12.8, 3.15 07/08/2016: 37.3, 12.7, 2.94 11/04/16: 3.55, 1.28, 2.77 02/23/17: PENDING    PATHOLOGY REPORT  Diagnosis 11/14/2015 1. Breast, lumpectomy, Left - INVASIVE DUCTAL CARCINOMA, GRADE III/III, SPANNING 1.1 CM. - THE RESECTION MARGINS ARE NEGATIVE FOR CARCINOMA. - SEE ONCOLOGY TABLE BELOW. 2. Lymph node, sentinel, biopsy, Left Axillary - BENIGN BREAST PARENCHYMA. - LYMPH NODAL TISSUE IS NOT IDENTIFIED. - THERE IS NO EVIDENCE OF MALIGNANCY.  RADIOGRAPHIC STUDIES: I have personally reviewed the radiological images as listed and agreed with the findings in the report. No results found.   Diagnostic Mammogram 11/11/16 IMPRESSION: 1. No mammographic evidence of malignancy involving either breast. 2. Expected post lumpectomy and post radiation changes involving the left breast. 3. Post surgical changes in the right upper breast at far posterior depth corresponding to the previous Port-A-Cath site. RECOMMENDATION: Bilateral diagnostic mammography in 1 year.  ASSESSMENT & PLAN:  83 y.o. female, with past medical history of MGUS, hypertension, arthritis, with good performance status, was found to have stage I triple negative left breast cancer by screening mammogram.  1. Malignant or new present of upper outer quadrant of left female breast, invasive ductal carcinoma, grade 3, pT1cN0M0, stage IA, ER-/PR-/HER2- --I previously discussed her breast  mammogram, ultrasound, biopsy and final surgical path result in details -She had sentinel lymph node biopsy, however it showed normal breast parenchyma, no lymphoid tissue, her pathological node status is unknown, no ultrasound evidence of adenopathy. -We previously reviewed the aggressive nature of triple negative breast cancer. Giving her stage I disease, I think she probably has 20-30% risk of distant recurrence in the next 5-10 years.  -I recommended weekly Taxol for 12 weeks as adjuvant chemotherapy to reduce her risk of cancer recurrence. -She has completed adjuvant chemotherapy and radiation.  -She has residual neuropathy. We previously discussed that neuropathy recover slowly. -I have encouraged her to eat healthy and exercise as much as possible.  -she has chronic low back pain, which has been worse lately. I do not think this is concerning for metastatic disease  -Labs reviewed, CBC and CMP are overall WNL. She is clinically doing well, exam was unremarkable, 2018 mammogram was normal, there is no clinical concern for  recurrence. -Next mammogram 10/2017. I encouraged her to have bone density scan and have results sent to me.  -Continue breast cancer surveillance.  -F/u in 6 months    2. MGUS  -lab reviewed with her, her M-protein and light chain levels have been overall stable  -No clinical concern for disease progression. will continue follow up SPEP/IFE, and light chain levels every 3-6 months. -No clinical concern for disease progression. We repeated her lab today, results still pending.   3. Worsening low back pain, lumbar spondylosis  -She has chronic low back pain from lumber spondylosis -She has had worsening low back pain lately, she had lumbar MRI in January 2018, which showed diffuse degenerative change, no evidence of bone metastasis -This is unlikely related to her breast cancer or MGUS -She does not want take pain medication. She wants to try acupuncture -I encouraged  her to follow-up with her rheumatologist Dr. Dossie Der   4. Anemia  -she has chronic anemia, worse in 2016 due to her surgery. -resolved now, we'll continue monitoring.  5. Joint and muscular pain, Rheumatoid arthritis -She has constant low back and left hip pain, possible related to Taxol and RA  -she will continue follow up with rheumatologist Dr. Dossie Der.  -I previously encouraged her to take the calcium and magnesium over-the-counter, drink fluids adequately. -We'll continue monitoring. -The patient has stopped taking Tramadol. -I previously reviewed her recent lumbar MRI findings. We discussed that the patient has degenerative osteoarthritis, related to age and not chenotherapy or radiation treatment. -I previously advised the patient to continue exercise to help with her ambulation and strength. She may follow up with her orthopedic surgeon to monitor this.  6. Mild peripheral neuropathy, G1-2 -No significant impact on her function, we'll continue to monitor closely. She understands that recovery from neuropathy is a slow process. -Overall stable, not resolved. We'll continue monitoring. -She has mild gait instability due to neuropathy and fatigue, I previously recommended her to get physical therapy; she is currently going to the gym twice a week. -I previously advised the patient to continue B12.  -B12 has helped improve her neuropathy lately. She is also taking Lyrica which was prescribed by neurologist   7. HTN -Her BP has been elevated lately, 170/77 today (02/23/17). This can be related to her continued back pain.  -I encouraged her to Follow with Dr. Maudie Mercury on this.   8. Short Term Memory loss -Pt shares concern for memory loss but she denies further medication to help. She declined clinical trial option. I suggest she does mental games and read and socialize to help.    Plan -Refer to acupuncturist today  -Continue breast cancer surveillance. -Lab and Follow up with me in 6 months.     All questions were answered. The patient knows to call the clinic with any problems, questions or concerns.  I spent 20 minutes counseling the patient face to face. The total time spent in the appointment was 25 minutes and more than 50% was on counseling.  This document serves as a record of services personally performed by Truitt Merle, MD. It was created on her behalf by Joslyn Devon, a trained medical scribe. The creation of this record is based on the scribe's personal observations and the provider's statements to them. This document has been checked and approved by the attending provider.     Truitt Merle, MD 02/23/2017

## 2017-02-19 NOTE — Telephone Encounter (Signed)
R/s appt per 9/27 sch message - left message with appt date and time.

## 2017-02-23 ENCOUNTER — Encounter: Payer: Self-pay | Admitting: Hematology

## 2017-02-23 ENCOUNTER — Telehealth: Payer: Self-pay | Admitting: Hematology

## 2017-02-23 ENCOUNTER — Other Ambulatory Visit (HOSPITAL_BASED_OUTPATIENT_CLINIC_OR_DEPARTMENT_OTHER): Payer: Medicare Other

## 2017-02-23 ENCOUNTER — Ambulatory Visit (HOSPITAL_BASED_OUTPATIENT_CLINIC_OR_DEPARTMENT_OTHER): Payer: Medicare Other | Admitting: Hematology

## 2017-02-23 VITALS — BP 170/77 | HR 59 | Temp 98.2°F | Resp 20 | Ht 63.0 in | Wt 213.6 lb

## 2017-02-23 DIAGNOSIS — D472 Monoclonal gammopathy: Secondary | ICD-10-CM

## 2017-02-23 DIAGNOSIS — I10 Essential (primary) hypertension: Secondary | ICD-10-CM | POA: Diagnosis not present

## 2017-02-23 DIAGNOSIS — G629 Polyneuropathy, unspecified: Secondary | ICD-10-CM | POA: Diagnosis not present

## 2017-02-23 DIAGNOSIS — Z171 Estrogen receptor negative status [ER-]: Secondary | ICD-10-CM

## 2017-02-23 DIAGNOSIS — C50412 Malignant neoplasm of upper-outer quadrant of left female breast: Secondary | ICD-10-CM | POA: Diagnosis not present

## 2017-02-23 DIAGNOSIS — C50411 Malignant neoplasm of upper-outer quadrant of right female breast: Secondary | ICD-10-CM

## 2017-02-23 LAB — COMPREHENSIVE METABOLIC PANEL
ALBUMIN: 3.6 g/dL (ref 3.5–5.0)
ALK PHOS: 84 U/L (ref 40–150)
ALT: 11 U/L (ref 0–55)
AST: 21 U/L (ref 5–34)
Anion Gap: 7 mEq/L (ref 3–11)
BILIRUBIN TOTAL: 0.71 mg/dL (ref 0.20–1.20)
BUN: 23.5 mg/dL (ref 7.0–26.0)
CO2: 29 mEq/L (ref 22–29)
CREATININE: 0.9 mg/dL (ref 0.6–1.1)
Calcium: 9.4 mg/dL (ref 8.4–10.4)
Chloride: 108 mEq/L (ref 98–109)
EGFR: 66 mL/min/{1.73_m2} — ABNORMAL LOW (ref >90)
GLUCOSE: 115 mg/dL (ref 70–140)
Potassium: 3.8 mEq/L (ref 3.5–5.1)
SODIUM: 144 meq/L (ref 136–145)
TOTAL PROTEIN: 6.8 g/dL (ref 6.4–8.3)

## 2017-02-23 LAB — CBC WITH DIFFERENTIAL/PLATELET
BASO%: 1.1 % (ref 0.0–2.0)
Basophils Absolute: 0 10*3/uL (ref 0.0–0.1)
EOS ABS: 0.2 10*3/uL (ref 0.0–0.5)
EOS%: 4.3 % (ref 0.0–7.0)
HCT: 36.6 % (ref 34.8–46.6)
HGB: 11.9 g/dL (ref 11.6–15.9)
LYMPH%: 19.8 % (ref 14.0–49.7)
MCH: 27.4 pg (ref 25.1–34.0)
MCHC: 32.6 g/dL (ref 31.5–36.0)
MCV: 84.1 fL (ref 79.5–101.0)
MONO#: 0.4 10*3/uL (ref 0.1–0.9)
MONO%: 9.9 % (ref 0.0–14.0)
NEUT%: 64.9 % (ref 38.4–76.8)
NEUTROS ABS: 2.8 10*3/uL (ref 1.5–6.5)
Platelets: 184 10*3/uL (ref 145–400)
RBC: 4.35 10*6/uL (ref 3.70–5.45)
RDW: 15 % — ABNORMAL HIGH (ref 11.2–14.5)
WBC: 4.3 10*3/uL (ref 3.9–10.3)
lymph#: 0.8 10*3/uL — ABNORMAL LOW (ref 0.9–3.3)

## 2017-02-23 NOTE — Telephone Encounter (Signed)
Scheduled appt per 10/2 los - lab and f/u in 6 months - reminder letter sent in the mail.

## 2017-02-24 LAB — KAPPA/LAMBDA LIGHT CHAINS
IG KAPPA FREE LIGHT CHAIN: 35.6 mg/L — AB (ref 3.3–19.4)
Ig Lambda Free Light Chain: 12.7 mg/L (ref 5.7–26.3)
Kappa/Lambda FluidC Ratio: 2.8 — ABNORMAL HIGH (ref 0.26–1.65)

## 2017-02-26 LAB — MULTIPLE MYELOMA PANEL, SERUM
ALPHA 1: 0.2 g/dL (ref 0.0–0.4)
ALPHA2 GLOB SERPL ELPH-MCNC: 0.6 g/dL (ref 0.4–1.0)
Albumin SerPl Elph-Mcnc: 3.3 g/dL (ref 2.9–4.4)
Albumin/Glob SerPl: 1.2 (ref 0.7–1.7)
B-GLOBULIN SERPL ELPH-MCNC: 1.4 g/dL — AB (ref 0.7–1.3)
Gamma Glob SerPl Elph-Mcnc: 0.8 g/dL (ref 0.4–1.8)
Globulin, Total: 3 g/dL (ref 2.2–3.9)
IGG (IMMUNOGLOBIN G), SERUM: 676 mg/dL — AB (ref 700–1600)
IGM (IMMUNOGLOBIN M), SRM: 50 mg/dL (ref 26–217)
IgA, Qn, Serum: 782 mg/dL — ABNORMAL HIGH (ref 64–422)
M PROTEIN SERPL ELPH-MCNC: 0.6 g/dL — AB
Total Protein: 6.3 g/dL (ref 6.0–8.5)

## 2017-03-04 ENCOUNTER — Other Ambulatory Visit: Payer: Medicare Other

## 2017-03-04 ENCOUNTER — Ambulatory Visit: Payer: Medicare Other | Admitting: Hematology

## 2017-03-08 ENCOUNTER — Ambulatory Visit: Payer: Medicare Other | Admitting: Hematology

## 2017-03-08 ENCOUNTER — Other Ambulatory Visit: Payer: Medicare Other

## 2017-04-12 DIAGNOSIS — E559 Vitamin D deficiency, unspecified: Secondary | ICD-10-CM | POA: Diagnosis not present

## 2017-04-12 DIAGNOSIS — E119 Type 2 diabetes mellitus without complications: Secondary | ICD-10-CM | POA: Diagnosis not present

## 2017-04-12 DIAGNOSIS — I10 Essential (primary) hypertension: Secondary | ICD-10-CM | POA: Diagnosis not present

## 2017-04-21 DIAGNOSIS — E559 Vitamin D deficiency, unspecified: Secondary | ICD-10-CM | POA: Diagnosis not present

## 2017-04-21 DIAGNOSIS — E119 Type 2 diabetes mellitus without complications: Secondary | ICD-10-CM | POA: Diagnosis not present

## 2017-04-21 DIAGNOSIS — E785 Hyperlipidemia, unspecified: Secondary | ICD-10-CM | POA: Diagnosis not present

## 2017-04-21 DIAGNOSIS — I10 Essential (primary) hypertension: Secondary | ICD-10-CM | POA: Diagnosis not present

## 2017-05-07 DIAGNOSIS — M0579 Rheumatoid arthritis with rheumatoid factor of multiple sites without organ or systems involvement: Secondary | ICD-10-CM | POA: Diagnosis not present

## 2017-05-07 DIAGNOSIS — M5030 Other cervical disc degeneration, unspecified cervical region: Secondary | ICD-10-CM | POA: Diagnosis not present

## 2017-05-07 DIAGNOSIS — M6281 Muscle weakness (generalized): Secondary | ICD-10-CM | POA: Diagnosis not present

## 2017-05-07 DIAGNOSIS — M19041 Primary osteoarthritis, right hand: Secondary | ICD-10-CM | POA: Diagnosis not present

## 2017-05-07 DIAGNOSIS — M549 Dorsalgia, unspecified: Secondary | ICD-10-CM | POA: Diagnosis not present

## 2017-05-07 DIAGNOSIS — Z79899 Other long term (current) drug therapy: Secondary | ICD-10-CM | POA: Diagnosis not present

## 2017-05-07 DIAGNOSIS — M5137 Other intervertebral disc degeneration, lumbosacral region: Secondary | ICD-10-CM | POA: Diagnosis not present

## 2017-05-07 DIAGNOSIS — M19042 Primary osteoarthritis, left hand: Secondary | ICD-10-CM | POA: Diagnosis not present

## 2017-05-19 DIAGNOSIS — E668 Other obesity: Secondary | ICD-10-CM | POA: Diagnosis not present

## 2017-05-19 DIAGNOSIS — I119 Hypertensive heart disease without heart failure: Secondary | ICD-10-CM | POA: Diagnosis not present

## 2017-05-19 DIAGNOSIS — E785 Hyperlipidemia, unspecified: Secondary | ICD-10-CM | POA: Diagnosis not present

## 2017-05-19 DIAGNOSIS — I080 Rheumatic disorders of both mitral and aortic valves: Secondary | ICD-10-CM | POA: Diagnosis not present

## 2017-06-04 ENCOUNTER — Telehealth: Payer: Self-pay | Admitting: *Deleted

## 2017-06-04 NOTE — Telephone Encounter (Signed)
Spoke with Julie [name] and informed her re:  Per Dr. Burr Medico, it is unlikely loose nails were chemo related.  Instructed Ingrid to make sure pt informed her PCP of the nail problems at office visit on Monday  06/07/17.   Ingrid voiced understanding.

## 2017-06-04 NOTE — Telephone Encounter (Signed)
Received call from daughter [name] requesting a call back from nurse.  Spoke with [name], and was informed that pt experienced broken fingernail on Left Thumb yesterday;  All other fingernails including toe nails are loose - like coming off soon.  Pt denied pain, denied drainage, denied bleeding, denied foul odor.   Pt can perform all ADLs without problems. [name] stated loose nails started since pt completed radiation after May 11, 2016. Pt has appt with her PCP on Monday 06/07/17.   [name] wanted to know if there is anything pt should be concerned. Instructed Ingrid to have pt monitor closely for any changes, and to call office for further instructions.  Ingrid voiced understanding. Ingrid's    Phone       [phone].

## 2017-06-07 DIAGNOSIS — B351 Tinea unguium: Secondary | ICD-10-CM | POA: Diagnosis not present

## 2017-06-14 DIAGNOSIS — M79672 Pain in left foot: Secondary | ICD-10-CM | POA: Diagnosis not present

## 2017-06-14 DIAGNOSIS — M7742 Metatarsalgia, left foot: Secondary | ICD-10-CM | POA: Diagnosis not present

## 2017-06-14 DIAGNOSIS — B351 Tinea unguium: Secondary | ICD-10-CM | POA: Diagnosis not present

## 2017-06-16 DIAGNOSIS — L720 Epidermal cyst: Secondary | ICD-10-CM | POA: Diagnosis not present

## 2017-06-16 DIAGNOSIS — L603 Nail dystrophy: Secondary | ICD-10-CM | POA: Diagnosis not present

## 2017-07-22 ENCOUNTER — Other Ambulatory Visit: Payer: Self-pay | Admitting: Neurology

## 2017-08-25 ENCOUNTER — Encounter: Payer: Self-pay | Admitting: Nurse Practitioner

## 2017-08-25 ENCOUNTER — Inpatient Hospital Stay: Payer: Medicare Other

## 2017-08-25 ENCOUNTER — Telehealth: Payer: Self-pay | Admitting: Nurse Practitioner

## 2017-08-25 ENCOUNTER — Ambulatory Visit (HOSPITAL_COMMUNITY)
Admission: RE | Admit: 2017-08-25 | Discharge: 2017-08-25 | Disposition: A | Discharge status: Home / Self Care | Payer: Medicare Other | Source: Ambulatory Visit | Attending: Nurse Practitioner | Admitting: Nurse Practitioner

## 2017-08-25 ENCOUNTER — Inpatient Hospital Stay: Payer: Medicare Other | Attending: Hematology | Admitting: Nurse Practitioner

## 2017-08-25 VITALS — BP 159/65 | HR 65 | Temp 97.8°F | Resp 18 | Ht 63.0 in | Wt 214.3 lb

## 2017-08-25 DIAGNOSIS — D649 Anemia, unspecified: Secondary | ICD-10-CM | POA: Insufficient documentation

## 2017-08-25 DIAGNOSIS — D472 Monoclonal gammopathy: Secondary | ICD-10-CM

## 2017-08-25 DIAGNOSIS — E119 Type 2 diabetes mellitus without complications: Secondary | ICD-10-CM | POA: Insufficient documentation

## 2017-08-25 DIAGNOSIS — C50412 Malignant neoplasm of upper-outer quadrant of left female breast: Secondary | ICD-10-CM

## 2017-08-25 DIAGNOSIS — Z13820 Encounter for screening for osteoporosis: Secondary | ICD-10-CM

## 2017-08-25 DIAGNOSIS — Z853 Personal history of malignant neoplasm of breast: Secondary | ICD-10-CM | POA: Insufficient documentation

## 2017-08-25 DIAGNOSIS — Z171 Estrogen receptor negative status [ER-]: Secondary | ICD-10-CM

## 2017-08-25 DIAGNOSIS — M79604 Pain in right leg: Secondary | ICD-10-CM

## 2017-08-25 DIAGNOSIS — G629 Polyneuropathy, unspecified: Secondary | ICD-10-CM | POA: Insufficient documentation

## 2017-08-25 DIAGNOSIS — M79605 Pain in left leg: Secondary | ICD-10-CM

## 2017-08-25 DIAGNOSIS — I10 Essential (primary) hypertension: Secondary | ICD-10-CM | POA: Insufficient documentation

## 2017-08-25 DIAGNOSIS — M25551 Pain in right hip: Secondary | ICD-10-CM | POA: Diagnosis present

## 2017-08-25 DIAGNOSIS — M160 Bilateral primary osteoarthritis of hip: Secondary | ICD-10-CM | POA: Insufficient documentation

## 2017-08-25 DIAGNOSIS — C50411 Malignant neoplasm of upper-outer quadrant of right female breast: Secondary | ICD-10-CM

## 2017-08-25 DIAGNOSIS — Z780 Asymptomatic menopausal state: Secondary | ICD-10-CM

## 2017-08-25 LAB — COMPREHENSIVE METABOLIC PANEL
ALBUMIN: 3.7 g/dL (ref 3.5–5.0)
ALT: 12 U/L (ref 0–55)
AST: 24 U/L (ref 5–34)
Alkaline Phosphatase: 97 U/L (ref 40–150)
Anion gap: 9 (ref 3–11)
BILIRUBIN TOTAL: 0.6 mg/dL (ref 0.2–1.2)
BUN: 26 mg/dL (ref 7–26)
CHLORIDE: 109 mmol/L (ref 98–109)
CO2: 27 mmol/L (ref 22–29)
Calcium: 10 mg/dL (ref 8.4–10.4)
Creatinine, Ser: 1.22 mg/dL — ABNORMAL HIGH (ref 0.60–1.10)
GFR calc Af Amer: 46 mL/min — ABNORMAL LOW (ref >60)
GFR calc non Af Amer: 40 mL/min — ABNORMAL LOW (ref >60)
GLUCOSE: 54 mg/dL — AB (ref 70–140)
POTASSIUM: 3.8 mmol/L (ref 3.5–5.1)
SODIUM: 145 mmol/L (ref 136–145)
TOTAL PROTEIN: 7.5 g/dL (ref 6.4–8.3)

## 2017-08-25 LAB — CBC WITH DIFFERENTIAL/PLATELET
Basophils Absolute: 0.1 10*3/uL (ref 0.0–0.1)
Basophils Relative: 1 %
EOS PCT: 3 %
Eosinophils Absolute: 0.2 10*3/uL (ref 0.0–0.5)
HEMATOCRIT: 38.6 % (ref 34.8–46.6)
Hemoglobin: 12.1 g/dL (ref 11.6–15.9)
LYMPHS ABS: 2.5 10*3/uL (ref 0.9–3.3)
LYMPHS PCT: 34 %
MCH: 27.1 pg (ref 25.1–34.0)
MCHC: 31.3 g/dL — AB (ref 31.5–36.0)
MCV: 86.4 fL (ref 79.5–101.0)
MONO ABS: 0.8 10*3/uL (ref 0.1–0.9)
MONOS PCT: 11 %
Neutro Abs: 3.8 10*3/uL (ref 1.5–6.5)
Neutrophils Relative %: 51 %
PLATELETS: 217 10*3/uL (ref 145–400)
RBC: 4.47 MIL/uL (ref 3.70–5.45)
RDW: 15 % — AB (ref 11.2–14.5)
WBC: 7.4 10*3/uL (ref 3.9–10.3)

## 2017-08-25 NOTE — Telephone Encounter (Signed)
Scheduled appt per 4/3 los - Gave patient AVS and calender per los.  

## 2017-08-25 NOTE — Progress Notes (Signed)
[name]  [phone] [phone]  Clinic Follow up Note   Patient Care Team: [name], MD as PCP - General (Internal Medicine) Valinda Party, MD as Referring Physician (Rheumatology) Rolm Bookbinder, MD as Consulting Physician (General Surgery) Truitt Merle, MD as Consulting Physician (Hematology) Gardenia Phlegm, NP as Nurse Practitioner (Hematology and Oncology) 08/25/2017  SUMMARY OF ONCOLOGIC HISTORY: Oncology History   Malignant neoplasm of upper outer quadrant of female breast [facility] [place])   Staging form: Breast, AJCC 7th Edition     Pathologic stage from 11/14/2015: Stage IA (T1c, N0, cM0) - Signed by Truitt Merle, MD on 11/25/2015     Clinical stage from 11/20/2015: Stage IA (T1c, N0, M0) - Signed by Curt Bears, MD on 11/20/2015       Malignant neoplasm of upper outer quadrant of female breast ([place])   10/09/2015 Mammogram    Screening bilateral mammograms showed a possible left breast mass. Diagnostic mammogram and ultrasound showed a 7 x 7 x 7 mm mass in the left breast at 11:00 position.      10/22/2015 Receptors her2    ER negative, PR negative, HER-2 not amplified      10/22/2015 Initial Biopsy    Left breast needle core biopsy at the 1:00 position showed invasive ductal carcinoma.      10/22/2015 Initial Diagnosis    Malignant neoplasm of upper outer quadrant of female breast ([place])      11/14/2015 Surgery    Left breast lumpectomy and sentinel lymph node biopsy [name])      11/14/2015 Pathology Results    Left breast lumpectomy showed invasive ductal carcinoma, grade 3, 1.1 cm, resection margins were negative, sentinel lymph node biopsy showed benign breast parenchyma, lymph node tissue not identified. Repeated ER PR and HER-2 were all negative.      12/20/2015 - 03/13/2016 Chemotherapy    Weekly Taxol 80 mg/m x 2 cycles, then changed to Abraxane 100 mg/m for cycles 3-12 due to infusion reaction. Abraxane  dose-reduced cycle #9, then again for cycles #11 & #12 due to neuropathy. [total 12 cycles chemo]       04/01/2016 - 05/11/2016 Radiation Therapy    Adjuvant breast radiation ([place]). Left breast: 50.4 Gy in 28 fractions      06/19/2016 Imaging    MR Lumbar Spine without contrast  IMPRESSION: 1. Diffuse lumbar spondylosis with discogenic and facet degenerative changes progressed from 2010 lumbar MRI. 2. Multilevel canal stenosis is mild-to-moderate at L2-3, moderate L3-4, and mild at L4-5. 3. Multiple levels of mild and moderate neural foraminal narrowing with severe foraminal narrowing at the right L3-4 and L4-5 levels.     Current therapy: Surveillance  INTERVAL HISTORY: Julie [name] returns for follow-up as scheduled with her daughter.  She has slight improvement in her chronic back pain after she changed her sleeping position, now sleeps elevated.  She developed bilateral, L>R lower extremity bone/joint pain in hips, knees, and ankles/feet started in February 2019.  She recalls a fall in Feb but unsure if pain is related. Takes Tylenol without much improvement.  Pain is worse in the morning, improves with movement.  Gets occasional "charley horse" in her buttock.  Continues to report peripheral neuropathy that has not improved, she has occasional difficulty with tasks.  She continues Lyrica 3 times daily.  She does not perform breast exam.  Regular bowel movement q. 2 days with prunes.  Good appetite.  Denies fever or chills.  REVIEW OF SYSTEMS:  Constitutional: Denies fevers, chills or abnormal weight loss (+) good appetite Eyes: Denies blurriness of vision Ears, nose, mouth, throat, and face: Denies mucositis or sore throat Respiratory: Denies cough, dyspnea or wheezes Cardiovascular: Denies palpitation, chest discomfort or lower extremity swelling Gastrointestinal:  Denies nausea, vomiting, constipation, diarrhea, bowel/bladder incontinence, heartburn or change in bowel habits (+)  BM q2 days, with prunes Skin: Denies abnormal skin rashes Lymphatics: Denies new lymphadenopathy or easy bruising Neurological:Denies new weaknesses (+) abnormal gait (+) neuropathy, stable (+) loss of sense of smell and taste (+)  Behavioral/Psych: Mood is stable, no new changes  MSK: (+) back pain, improved (+) chronic wrist pain (+) bilateral L>R hips, knee, ankle/foot pain since 06/2017 Breast: Denies nipple discharge  All other systems were reviewed with the patient and are negative.  MEDICAL HISTORY:  Past Medical History:  Diagnosis Date  . Anemia   . Anxiety    takes Klonopin at bedtime  . Arthritis    takes Methotrexate weekly and Prednisone  . Blood transfusion    no abnormal reaction noted  . Cataracts, bilateral    immature  . Complication of anesthesia    wakes up during surgery  . Depression   . Diabetes mellitus without complication ([place])   . Dyspnea    with exertion since chemo   . Fibromyalgia    takes Lyrica daily  . GERD (gastroesophageal reflux disease)    takes Nexium daily  . Glaucoma   . Hard of hearing   . Heart murmur   . Histoplasmosis   . History of colon polyps   . History of gout   . History of hiatal hernia   . History of shingles   . Hyperlipidemia   . Hypertension    takes Amlodipine,Metoprolol,and Losartan daily  . Incontinence of bowel   . Interstitial cystitis   . Joint pain   . Low back pain   . Nocturia   . Peripheral neuropathy   . Peripheral neuropathy   . PONV (postoperative nausea and vomiting)   . Ulcer 1970   duodenal  . Urinary frequency   . Urinary urgency   . Weakness    numbness in both hands and feet    SURGICAL HISTORY: Past Surgical History:  Procedure Laterality Date  . ABDOMINAL HYSTERECTOMY    . accupuncture  3 yrs ago  . ANKLE FUSION Left 05/10/2014   Procedure: LEFT ANKLE ARTHRODESIS ;  Surgeon: Wylene Simmer, MD;  Location: [place];  Service: Orthopedics;  Laterality: Left;  . ANKLE SURGERY Left   .  arm surgery Left    radius  . bladder tacked     . CERVICAL FUSION    . CHOLECYSTECTOMY    . COLONOSCOPY    . CYSTOCELE REPAIR    . DILATION AND CURETTAGE OF UTERUS    . ESOPHAGOGASTRODUODENOSCOPY    . PORT A CATH REVISION N/A 12/27/2015   Procedure: PORT A CATH REVISION;  Surgeon: Rolm Bookbinder, MD;  Location: [place];  Service: General;  Laterality: N/A;  . PORT-A-CATH REMOVAL N/A 03/25/2016   Procedure: REMOVAL PORT-A-CATH;  Surgeon: Rolm Bookbinder, MD;  Location: [facility];  Service: General;  Laterality: N/A;  . PORTACATH PLACEMENT Right 12/27/2015   Procedure: INSERTION PORT-A-CATH WITH ULTRASOUND ;  Surgeon: Rolm Bookbinder, MD;  Location: [place];  Service: General;  Laterality: Right;  . RADIOACTIVE SEED GUIDED PARTIAL MASTECTOMY WITH AXILLARY SENTINEL LYMPH NODE BIOPSY Left 11/14/2015   Procedure: RADIOACTIVE SEED  GUIDED LEFT BREAST LUMPECTOMY WITH AXILLARY SENTINEL LYMPH NODE BIOPSY;  Surgeon: Rolm Bookbinder, MD;  Location: [place];  Service: General;  Laterality: Left;  RADIOACTIVE SEED GUIDED LEFT BREAST LUMPECTOMY WITH AXILLARY SENTINEL LYMPH NODE BIOPSY  . SALPINGOOPHORECTOMY Bilateral   . TOTAL KNEE ARTHROPLASTY Right 02/28/2015   Procedure: RIGHT TOTAL KNEE ARTHROPLASTY;  Surgeon: Rod Can, MD;  Location: [facility];  Service: Orthopedics;  Laterality: Right;  . UPPER GASTROINTESTINAL ENDOSCOPY    . VAGINAL HYSTERECTOMY      I have reviewed the social history and family history with the patient and they are unchanged from previous note.  ALLERGIES:  is allergic to codeine; other; and aspirin.  MEDICATIONS:  Current Outpatient Medications  Medication Sig Dispense Refill  . acetaminophen (TYLENOL) 500 MG tablet Take 500 mg by mouth every 6 (six) hours as needed (for pain/fever.).     [name] amLODipine (NORVASC) 10 MG tablet Take 10 mg by mouth every morning.     . Biotin 10000 MCG TABS Take 1 tablet by mouth daily.     . calcium-vitamin D (OSCAL WITH D) 500-200 MG-UNIT per tablet Take 1 tablet by mouth every morning.    [name] doxazosin (CARDURA) 1 MG tablet Take 1 mg by mouth daily.  1  . fish oil-omega-3 fatty acids 1000 MG capsule Take 1 g by mouth every morning.     . folic acid (FOLVITE) 1 MG tablet Take 2 mg by mouth every morning.     [name] FREESTYLE LITE test strip TEST ONCE EVERY DAY  4  . hydroxypropyl methylcellulose / hypromellose (ISOPTO TEARS / GONIOVISC) 2.5 % ophthalmic solution Place 1 drop into both eyes daily as needed for dry eyes.    . Lidocaine 2 % GEL Apply 1 g topically as needed. 2 Tube 11  . lovastatin (MEVACOR) 40 MG tablet 1 (ONE) TABLET TAKE IN THE EVENING AFTER DINNER  3  . LYRICA 75 MG capsule TAKE ONE CAPSULE BY MOUTH 3 TIMES A DAY 90 capsule 5  . meloxicam (MOBIC) 15 MG tablet Take 15 mg by mouth daily.  11  . metFORMIN (GLUCOPHAGE) 500 MG tablet Take 500 mg by mouth daily.     . metoprolol succinate (TOPROL-XL) 50 MG 24 hr tablet TAKE 1 TABLET AT BEDTIME ONCE A DAY ORALLY 90 DAYS  2  . valsartan-hydrochlorothiazide (DIOVAN-HCT) 320-12.5 MG per tablet Take 1 tablet by mouth daily.    . vitamin B-12 (CYANOCOBALAMIN) 1000 MCG tablet Take 1,000 mcg by mouth daily.    . vitamin E 400 UNIT capsule Take 400 Units by mouth daily.    [name] ezetimibe (ZETIA) 10 MG tablet Take 10 mg by mouth daily.     . pravastatin (PRAVACHOL) 20 MG tablet Take 20 mg by mouth daily.  3  . traMADol (ULTRAM) 50 MG tablet TAKE 1 TABLET BY MOUTH EVERY 6 TO 8 HOURS AS NEEDED FOR 30 DAYS (Patient not taking: Reported on 08/25/2017) 60 tablet 0   No current facility-administered medications for this visit.     PHYSICAL EXAMINATION: ECOG PERFORMANCE STATUS: 2 - Symptomatic, <50% confined to bed  Vitals:   08/25/17 1025  BP: (!) 159/65  Pulse: 65  Resp: 18  Temp: 97.8 F (36.6 C)  SpO2: 100%   Filed Weights   08/25/17 1025  Weight: 214 lb 4.8 oz (97.2 kg)    GENERAL:alert, no distress and comfortable SKIN:  skin color, texture, turgor are normal, no rashes or significant lesions EYES: normal,  Conjunctiva are pink and non-injected, sclera clear OROPHARYNX:no exudate, no erythema and lips, buccal mucosa, and tongue normal  LYMPH:  no palpable cervical, supraclavicular, or axillary lymphadenopathy LUNGS: clear to auscultation with normal breathing effort HEART: regular rate & rhythm and no murmurs and no lower extremity edema ABDOMEN:abdomen soft, non-tender and normal bowel sounds Musculoskeletal:no cyanosis of digits and no clubbing  NEURO: alert & oriented x 3 with fluent speech BREASTS: inspection shows them to be symmetrical without nipple discharge. No palpable mass in right or left breast or axilla that I could appreciate. (+) left breast s/p lumpectomy, incisions are well healed. Mild skin hyperpigmentation secondary to radiation persists.   PAC site is well healed   LABORATORY DATA:  I have reviewed the data as listed CBC Latest Ref Rng & Units 08/25/2017 02/23/2017 11/04/2016  WBC 3.9 - 10.3 K/uL 7.4 4.3 4.1  Hemoglobin 11.6 - 15.9 g/dL 12.1 11.9 12.2  Hematocrit 34.8 - 46.6 % 38.6 36.6 38.3  Platelets 145 - 400 K/uL 217 184 200     CMP Latest Ref Rng & Units 08/25/2017 02/23/2017 02/23/2017  Glucose 70 - 140 mg/dL 54(L) 115 -  BUN 7 - 26 mg/dL 26 23.5 -  Creatinine 0.60 - 1.10 mg/dL 1.22(H) 0.9 -  Sodium 136 - 145 mmol/L 145 144 -  Potassium 3.5 - 5.1 mmol/L 3.8 3.8 -  Chloride 98 - 109 mmol/L 109 - -  CO2 22 - 29 mmol/L 27 29 -  Calcium 8.4 - 10.4 mg/dL 10.0 9.4 -  Total Protein 6.4 - 8.3 g/dL 7.5 6.8 6.3  Total Bilirubin 0.2 - 1.2 mg/dL 0.6 0.71 -  Alkaline Phos 40 - 150 U/L 97 84 -  AST 5 - 34 U/L 24 21 -  ALT 0 - 55 U/L 12 11 -   SPEP M-protein (g/dl) 02/10/2012: 0.74 12/20/2015: 0.9 03/06/2016: 0.4 07/08/2016: 0.8 11/04/16: 0.8 02/23/17: 0.6 08/25/2017: PENDING   IgA (mg/dl) 02/10/2012: 822 12/20/2015: 783 03/06/2016: 668 07/08/2016: 763 11/04/16: 853 02/23/17:  782 08/25/17: PENDING   Kappa, lambda light chain (mg/l) and ration  02/10/2012: 14.3, 16.3, 0.88 12/20/2015: 40.3, 12.8, 3.15 07/08/2016: 37.3, 12.7, 2.94 11/04/16: 3.55, 1.28, 2.77 02/23/17: 3.56, 1.27, 2.80 08/25/17 PENDING    PATHOLOGY REPORT  Diagnosis 11/14/2015 1. Breast, lumpectomy, Left - INVASIVE DUCTAL CARCINOMA, GRADE III/III, SPANNING 1.1 CM. - THE RESECTION MARGINS ARE NEGATIVE FOR CARCINOMA. - SEE ONCOLOGY TABLE BELOW. 2. Lymph node, sentinel, biopsy, Left Axillary - BENIGN BREAST PARENCHYMA. - LYMPH NODAL TISSUE IS NOT IDENTIFIED. - THERE IS NO EVIDENCE OF MALIGNANCY.   RADIOGRAPHIC STUDIES: I have personally reviewed the radiological images as listed and agreed with the findings in the report. No results found.   ASSESSMENT & PLAN: 84 y.o. female, with past medical history of MGUS, hypertension, arthritis, with good performance status, was found to have stage I triple negative left breast cancer by screening mammogram.  1. Malignant or new present of upper outer quadrant of left female breast, invasive ductal carcinoma, grade 3, pT1cN0M0, stage IA, ER-/PR-/HER2- 2. MGUS 3.  Low back pain, lumbar spondylosis 4.  Anemia 5.  Joint and muscular pain, RA 6.  Peripheral neuropathy, G2 7.  HTN, DM 8.  Short term memory loss 9.  Hypoglycemia  Julie [name] appears stable.  She is nearly 2 years out from diagnosis of left breast cancer, status post left lumpectomy, followed by adjuvant chemo with weekly Taxol/Abraxane x12 then adjuvant radiation.  She has residual G2 neuropathy, on Lyrica and B  vitamin; overall stable.  She is clinically doing well from a breast cancer standpoint, exam was unremarkable, no clinical concern for recurrence.  To new breast cancer surveillance, next mammogram due 10/2017, ordered today.  She will get a bone density scan same day.    For her chronic back and bilateral lower extremity joint pain she has had multiple imaging confirming degenerative  changes in hips and lumbar spine.  Will obtain bilateral hip x-ray today given new pain although I have low suspicion for metastatic disease.  In the meantime I encouraged her to be active as tolerated and try to exercise, she plans to try the stationary bike.  Labs reviewed, CBC unremarkable.  He is hypoglycemic on CMP today, BG 54 I gave her peanut butter crackers and coke to consume during today's visit.  On metformin, I encouraged her to follow-up with Dr. Maudie Mercury for DM management, I will CC my note. Creatinine is mildly elevated today, she reports she does not drinking of water.  I encouraged her to hydrate adequately and avoid nephrotoxic medication.  MGUS labs have been stable, pending today.  Will monitor closely   Plan -Continue breast cancer surveillance, next mammogram due 11/11/2017, ordered today -DEXA with mammogram 11/11/2017, ordered today -Follow-up with PCP for DM management; CC note -Bilateral hip x-ray today -lab, f/u with Dr. Burr Medico in 6 months   Orders Placed This Encounter  Procedures  . MM DIAG BREAST TOMO BILATERAL    [facility] PF 11/11/16 @ [facility]/HX LT LUMPECTOMY/NO PROBLEMS/NO NEEDS/TOMO/BC PT COSIGN REQ    Standing Status:   Future    Standing Expiration Date:   08/26/2018    Order Specific Question:   Reason for Exam (SYMPTOM  OR DIAGNOSIS REQUIRED)    Answer:   h/o left breast cancer triple negative, s/p lumpectomy, chemo, and radiation    Order Specific Question:   Preferred imaging location?    Answer:   [facility]  . DG Bone Density    INS-[facility] PF UNKNOWN WHEN & WHERE/WT 214 LBS/PT INFORMED TO STOP CALC SUPP 2 DAYS PRIOR/NO NEEDS/BC PT COSIGN REQ    Standing Status:   Future    Standing Expiration Date:   08/25/2018    Order Specific Question:   Reason for Exam (SYMPTOM  OR DIAGNOSIS REQUIRED)    Answer:   h/o left breast cancer triple negative, s/p lumpectomy, chemo, and radiation    Order Specific Question:   Preferred imaging location?    Answer:    [facility]  . DG HIPS BILAT WITH PELVIS 3-4 VIEWS    Standing Status:   Future    Number of Occurrences:   1    Standing Expiration Date:   10/26/2018    Order Specific Question:   Reason for Exam (SYMPTOM  OR DIAGNOSIS REQUIRED)    Answer:   bilateral hip pain L>R x2 months    Order Specific Question:   Preferred imaging location?    Answer:   [facility]    Order Specific Question:   Radiology Contrast Protocol - do NOT remove file path    Answer:   [email or web address]   All questions were answered. The patient knows to call the clinic with any problems, questions or concerns. No barriers to learning was detected. I spent 25 minutes counseling the patient face to face. The total time spent in the appointment was 30 minutes and more than 50% was on counseling and review of test results     Lacie K  Kalman Shan, NP 08/25/17

## 2017-08-26 LAB — KAPPA/LAMBDA LIGHT CHAINS
KAPPA, LAMDA LIGHT CHAIN RATIO: 3.57 — AB (ref 0.26–1.65)
Kappa free light chain: 46.1 mg/L — ABNORMAL HIGH (ref 3.3–19.4)
Lambda free light chains: 12.9 mg/L (ref 5.7–26.3)

## 2017-08-30 LAB — MULTIPLE MYELOMA PANEL, SERUM
ALBUMIN SERPL ELPH-MCNC: 3.6 g/dL (ref 2.9–4.4)
ALPHA 1: 0.2 g/dL (ref 0.0–0.4)
Albumin/Glob SerPl: 1.1 (ref 0.7–1.7)
Alpha2 Glob SerPl Elph-Mcnc: 0.7 g/dL (ref 0.4–1.0)
B-GLOBULIN SERPL ELPH-MCNC: 1.6 g/dL — AB (ref 0.7–1.3)
GAMMA GLOB SERPL ELPH-MCNC: 0.7 g/dL (ref 0.4–1.8)
GLOBULIN, TOTAL: 3.3 g/dL (ref 2.2–3.9)
IgA: 984 mg/dL — ABNORMAL HIGH (ref 64–422)
IgG (Immunoglobin G), Serum: 881 mg/dL (ref 700–1600)
IgM (Immunoglobulin M), Srm: 57 mg/dL (ref 26–217)
M PROTEIN SERPL ELPH-MCNC: 0.8 g/dL — AB
Total Protein ELP: 6.9 g/dL (ref 6.0–8.5)

## 2017-09-03 ENCOUNTER — Telehealth: Payer: Self-pay | Admitting: *Deleted

## 2017-09-03 NOTE — Telephone Encounter (Signed)
"  Just talking with the doctor about my treatment or changes.  Call lost or disconnected so I'm calling back."

## 2017-09-10 ENCOUNTER — Telehealth: Payer: Self-pay | Admitting: Nurse Practitioner

## 2017-09-10 NOTE — Telephone Encounter (Signed)
I called patient to discuss medical management of her neuropathy, I was able to reach her few days ago but was cut off repeatedly due to poor connection and not able to finish conversation. I left message today for patient to return my call to discuss further. I was able to notify her that hip xray shows osteoarthritis but otherwise negative. She appreciates that information.

## 2017-09-14 ENCOUNTER — Telehealth: Payer: Self-pay | Admitting: *Deleted

## 2017-09-14 NOTE — Telephone Encounter (Signed)
Returned call to pt from yest & she was waiting on a script to be called in by Cira Rue NP.  She states that her daughter never heard from anyone.  Message left for Lacie to f/u with pt tomorrow.  She is at home now & reports legs or excruciating.

## 2017-09-15 ENCOUNTER — Telehealth: Payer: Self-pay | Admitting: Nurse Practitioner

## 2017-09-15 ENCOUNTER — Other Ambulatory Visit: Payer: Self-pay | Admitting: Nurse Practitioner

## 2017-09-15 DIAGNOSIS — M79605 Pain in left leg: Principal | ICD-10-CM

## 2017-09-15 DIAGNOSIS — M79604 Pain in right leg: Secondary | ICD-10-CM

## 2017-09-15 MED ORDER — DULOXETINE HCL 30 MG PO CPEP: 30.0000 mg | ORAL_CAPSULE | Freq: Every day | ORAL | 1 refills | Status: DC

## 2017-09-15 NOTE — Telephone Encounter (Signed)
I called patient back to discuss cymbalta for pain. I reviewed potential side effects. She is agreeable to try. She knows to call if she has unacceptable side effects, medication is not effective, or she has other concerns. I called in to her pharmacy. She verbalizes understanding.

## 2017-09-19 ENCOUNTER — Emergency Department (HOSPITAL_BASED_OUTPATIENT_CLINIC_OR_DEPARTMENT_OTHER)
Admit: 2017-09-19 | Discharge: 2017-09-19 | Disposition: A | Discharge status: Home / Self Care | Payer: Medicare Other | Attending: Emergency Medicine | Admitting: Emergency Medicine

## 2017-09-19 ENCOUNTER — Emergency Department (HOSPITAL_COMMUNITY)
Admission: EM | Admit: 2017-09-19 | Discharge: 2017-09-19 | Disposition: A | Discharge status: Home / Self Care | Payer: Medicare Other | Attending: Emergency Medicine | Admitting: Emergency Medicine

## 2017-09-19 ENCOUNTER — Encounter (HOSPITAL_COMMUNITY): Payer: Self-pay | Admitting: Emergency Medicine

## 2017-09-19 DIAGNOSIS — M79609 Pain in unspecified limb: Secondary | ICD-10-CM

## 2017-09-19 DIAGNOSIS — R200 Anesthesia of skin: Secondary | ICD-10-CM | POA: Insufficient documentation

## 2017-09-19 DIAGNOSIS — I10 Essential (primary) hypertension: Secondary | ICD-10-CM | POA: Diagnosis not present

## 2017-09-19 DIAGNOSIS — Z853 Personal history of malignant neoplasm of breast: Secondary | ICD-10-CM | POA: Diagnosis not present

## 2017-09-19 DIAGNOSIS — E1140 Type 2 diabetes mellitus with diabetic neuropathy, unspecified: Secondary | ICD-10-CM | POA: Diagnosis not present

## 2017-09-19 DIAGNOSIS — E785 Hyperlipidemia, unspecified: Secondary | ICD-10-CM | POA: Insufficient documentation

## 2017-09-19 DIAGNOSIS — M79605 Pain in left leg: Secondary | ICD-10-CM | POA: Diagnosis not present

## 2017-09-19 DIAGNOSIS — M25552 Pain in left hip: Secondary | ICD-10-CM

## 2017-09-19 DIAGNOSIS — Z79899 Other long term (current) drug therapy: Secondary | ICD-10-CM | POA: Diagnosis not present

## 2017-09-19 HISTORY — DX: Malignant (primary) neoplasm, unspecified: C80.1

## 2017-09-19 MED ORDER — KETOROLAC TROMETHAMINE 30 MG/ML IJ SOLN: 15.0000 mg | Freq: Once | INTRAMUSCULAR | Status: DC

## 2017-09-19 MED ORDER — ACETAMINOPHEN 325 MG PO TABS
650.0000 mg | ORAL_TABLET | Freq: Once | ORAL | Status: AC
  Administered 2017-09-19: 650 mg via ORAL
  Filled 2017-09-19: qty 2

## 2017-09-19 MED ORDER — CALCIUM CARBONATE ANTACID 500 MG PO CHEW: 2.0000 | CHEWABLE_TABLET | Freq: Once | ORAL | Status: DC

## 2017-09-19 NOTE — Discharge Instructions (Signed)
You may take 500 to 1000 mg of Tylenol every 6 hours as needed for pain.  Do not exceed 4000 mg of Tylenol daily.  Apply heat for 20 minutes at a time for comfort.  Take some hot baths or hot showers and do some gentle stretching exercises to avoid muscle stiffness.  We discussed that yoga, aquatic therapy, and stationary bike exercises may be helpful for your pain.  Use a cane or walker to help you walk around.  Follow-up with your orthopedic physician tomorrow as scheduled for reevaluation.  He may want to refer you to physical therapy.  Return to the emergency department immediately for any concerning signs or symptoms develop such as high fevers, weakness, swelling, or redness of the leg.

## 2017-09-19 NOTE — Progress Notes (Signed)
VASCULAR LAB PRELIMINARY  PRELIMINARY  PRELIMINARY  PRELIMINARY  Left lower extremity venous duplex completed.    Preliminary report:  There is no DVT or SVT noted in the left lower extremity.   Gave report to [facility], PA-C  Benaiah Behan, RVT 09/19/2017, 5:26 PM

## 2017-09-19 NOTE — ED Provider Notes (Signed)
[facility] DEPT Provider Note   CSN: [id number] Arrival date & time: 09/19/17  1029     History   Chief Complaint Chief Complaint  Patient presents with  . bilat hip pain  . Leg Pain    bilat    HPI Julie [name] is a 84 y.o. female with history of breast cancer currently under surveillance, anxiety, fibromyalgia, hypertension, peripheral neuropathy, duodenal ulcer presents for evaluation of worsening bilateral hip pain.  She has been experiencing chronic pain of the bilateral hips, left worse than right constantly for some time but she states that it acutely worsened in the left hip last night.  Pain is sharp, throbbing, aching, worsens with any position changes and ambulation.  She states she experiences mild relief laying flat and heat is helpful as well.  She did experience a fall a couple of weeks ago but denies head injury or loss of consciousness and states she landed on her right knee.  Pain radiates down the left lower extremity worst along the left thigh.  She endorses numbness to the lateral aspect of the left thigh which does not past the knee.  Her oncology NP recently started her on Cymbalta 30 mg which she has been taking for the past 5 days without relief of her symptoms.  She also has tried Mobic without relief.  She did recently return from a trip to [facility].  She states that the flight there was approximately 5 hours and she did not ambulate during the flight.  She drove back over the course of 5 days and states that she took breaks throughout but is unsure of the longest amount of time immobilized.  No prior history of DVT or PE.  She ambulate with a cane at baseline.  The history is provided by the patient.    Past Medical History:  Diagnosis Date  . Anemia   . Anxiety    takes Klonopin at bedtime  . Arthritis    takes Methotrexate weekly and Prednisone  . Blood transfusion    no abnormal reaction noted  . Cancer (HCC)    breast   . Cataracts, bilateral    immature  . Complication of anesthesia    wakes up during surgery  . Depression   . Diabetes mellitus without complication ([place])   . Dyspnea    with exertion since chemo   . Fibromyalgia    takes Lyrica daily  . GERD (gastroesophageal reflux disease)    takes Nexium daily  . Glaucoma   . Hard of hearing   . Heart murmur   . Histoplasmosis   . History of colon polyps   . History of gout   . History of hiatal hernia   . History of shingles   . Hyperlipidemia   . Hypertension    takes Amlodipine,Metoprolol,and Losartan daily  . Incontinence of bowel   . Interstitial cystitis   . Joint pain   . Low back pain   . Nocturia   . Peripheral neuropathy   . Peripheral neuropathy   . PONV (postoperative nausea and vomiting)   . Ulcer 1970   duodenal  . Urinary frequency   . Urinary urgency   . Weakness    numbness in both hands and feet    Patient Active Problem List   Diagnosis Date Noted  . Peripheral neuropathy 06/08/2016  . Low back pain without sciatica 06/08/2016  . Abnormality of gait 06/08/2016  . Anemia due to  antineoplastic chemotherapy 03/06/2016  . Hypersensitivity reaction 01/07/2016  . Port catheter in place 01/03/2016  . Malignant neoplasm of upper outer quadrant of female breast ([place]) 10/30/2015  . Primary osteoarthritis of right knee 02/28/2015  . Post-traumatic arthritis of ankle 05/10/2014  . MGUS (monoclonal gammopathy of unknown significance) 02/17/2012  . Diverticulosis of colon (without mention of hemorrhage) 01/21/2011  . Family history of malignant neoplasm of gastrointestinal tract 01/21/2011  . Special screening for malignant neoplasms, colon 01/21/2011  . Other symptoms involving digestive system(787.99) 01/21/2011    Past Surgical History:  Procedure Laterality Date  . ABDOMINAL HYSTERECTOMY    . accupuncture  3 yrs ago  . ANKLE FUSION Left 05/10/2014   Procedure: LEFT ANKLE ARTHRODESIS ;  Surgeon: Wylene Simmer, MD;  Location: [facility];  Service: Orthopedics;  Laterality: Left;  . ANKLE SURGERY Left   . arm surgery Left    radius  . bladder tacked     . CERVICAL FUSION    . CHOLECYSTECTOMY    . COLONOSCOPY    . CYSTOCELE REPAIR    . DILATION AND CURETTAGE OF UTERUS    . ESOPHAGOGASTRODUODENOSCOPY    . PORT A CATH REVISION N/A 12/27/2015   Procedure: PORT A CATH REVISION;  Surgeon: Rolm Bookbinder, MD;  Location: [facility];  Service: General;  Laterality: N/A;  . PORT-A-CATH REMOVAL N/A 03/25/2016   Procedure: REMOVAL PORT-A-CATH;  Surgeon: Rolm Bookbinder, MD;  Location: [facility];  Service: General;  Laterality: N/A;  . PORTACATH PLACEMENT Right 12/27/2015   Procedure: INSERTION PORT-A-CATH WITH ULTRASOUND ;  Surgeon: Rolm Bookbinder, MD;  Location: [facility];  Service: General;  Laterality: Right;  . RADIOACTIVE SEED GUIDED PARTIAL MASTECTOMY WITH AXILLARY SENTINEL LYMPH NODE BIOPSY Left 11/14/2015   Procedure: RADIOACTIVE SEED GUIDED LEFT BREAST LUMPECTOMY WITH AXILLARY SENTINEL LYMPH NODE BIOPSY;  Surgeon: Rolm Bookbinder, MD;  Location: [facility];  Service: General;  Laterality: Left;  RADIOACTIVE SEED GUIDED LEFT BREAST LUMPECTOMY WITH AXILLARY SENTINEL LYMPH NODE BIOPSY  . SALPINGOOPHORECTOMY Bilateral   . TOTAL KNEE ARTHROPLASTY Right 02/28/2015   Procedure: RIGHT TOTAL KNEE ARTHROPLASTY;  Surgeon: Rod Can, MD;  Location: [facility];  Service: Orthopedics;  Laterality: Right;  . UPPER GASTROINTESTINAL ENDOSCOPY    . VAGINAL HYSTERECTOMY       OB History   None      Home Medications    Prior to Admission medications   Medication Sig Start Date End Date Taking? Authorizing Provider  acetaminophen (TYLENOL) 500 MG tablet Take 500 mg by mouth every 6 (six) hours as needed (for pain/fever.).    Yes [provider]  amLODipine (NORVASC) 10 MG tablet Take 10 mg by mouth every morning.    Yes [provider]  Biotin  10000 MCG TABS Take 1 tablet by mouth daily.   Yes [provider]  calcium-vitamin D (OSCAL WITH D) 500-200 MG-UNIT per tablet Take 1 tablet by mouth every morning.   Yes [provider]  DULoxetine (CYMBALTA) 30 MG capsule Take 1 capsule (30 mg total) by mouth daily. 09/15/17  Yes Alla Feeling, NP  fish oil-omega-3 fatty acids 1000 MG capsule Take 1 g by mouth every morning.    Yes [provider]  folic acid (FOLVITE) 1 MG tablet Take 2 mg by mouth every morning.    Yes [provider]  lovastatin (MEVACOR) 40 MG tablet 1 (ONE) TABLET BY MOUTH TAKE IN THE EVENING AFTER DINNER 02/17/17  Yes [provider]  LYRICA 75 MG capsule TAKE ONE CAPSULE BY MOUTH 3 TIMES A DAY 01/12/17  Yes Sater, Nanine Means, MD  meloxicam (MOBIC) 15 MG tablet Take 15 mg by mouth daily. 11/09/15  Yes [provider]  metoprolol succinate (TOPROL-XL) 50 MG 24 hr tablet TAKE 1 TABLET AT BEDTIME ONCE A DAY ORALLY 90 DAYS 08/29/15  Yes [provider]  valsartan-hydrochlorothiazide (DIOVAN-HCT) 320-12.5 MG per tablet Take 1 tablet by mouth daily.   Yes [provider]  vitamin B-12 (CYANOCOBALAMIN) 1000 MCG tablet Take 1,000 mcg by mouth daily.   Yes [provider]  vitamin E 400 UNIT capsule Take 400 Units by mouth daily.   Yes [provider]  FREESTYLE LITE test strip TEST ONCE EVERY DAY 01/27/17   [provider]  hydroxypropyl methylcellulose / hypromellose (ISOPTO TEARS / GONIOVISC) 2.5 % ophthalmic solution Place 1 drop into both eyes daily as needed for dry eyes.    [provider]  Lidocaine 2 % GEL Apply 1 g topically as needed. Patient not taking: Reported on 09/19/2017 07/23/16   Marcial Pacas, MD  traMADol (ULTRAM) 50 MG tablet TAKE 1 TABLET BY MOUTH EVERY 6 TO 8 HOURS AS NEEDED FOR 30 DAYS Patient not taking: Reported on 08/25/2017 06/15/16   Truitt Merle, MD    Family History Family History  Problem Relation Age of  Onset  . Colon cancer Sister 69  . Esophageal cancer Brother 22  . Breast cancer Mother   . Brain cancer Other     Social History Social History   Tobacco Use  . Smoking status: Never Smoker  . Smokeless tobacco: Never Used  Substance Use Topics  . Alcohol use: No    Comment: IN [facility] ONLY  . Drug use: No     Allergies   Codeine; Other; and Aspirin   Review of Systems Review of Systems  Constitutional: Negative for chills and fever.  Musculoskeletal: Positive for arthralgias.  Neurological: Positive for numbness. Negative for weakness.  All other systems reviewed and are negative.    Physical Exam Updated Vital Signs BP 138/84 (BP Location: Right Arm)   Pulse (!) 51 Comment: P.A., Teryl Gubler notified  Temp 98.5 F (36.9 C) (Oral)   Resp (!) 22   Ht 5\' 4"  (1.626 m)   Wt 97.1 kg (214 lb)   SpO2 100% Comment: P.A., Elayah Klooster notified  BMI 36.73 kg/m   Physical Exam  Constitutional: She appears well-developed and well-nourished. No distress.  HENT:  Head: Normocephalic and atraumatic.  Eyes: Conjunctivae are normal. Right eye exhibits no discharge. Left eye exhibits no discharge.  Neck: No JVD present. No tracheal deviation present.  Cardiovascular: Normal rate and intact distal pulses.  2+ DP/PT pulses bilaterally, no lower extremity edema, no palpable cords. Compartments are soft.   Pulmonary/Chest: Effort normal.  Abdominal: She exhibits no distension.  Musculoskeletal: She exhibits tenderness. She exhibits no edema.  No midline spine TTP, no paraspinal muscle tenderness, no deformity, crepitus, or step-off noted.  No SI joint tenderness noted.  5/5 strength of BLE major muscle groups.  Pain elicited with active flexion of the left hip and passive internal rotation.  Neurological: She is alert.  Fluent speech with no evidence of dysarthria or aphasia, no facial droop, altered/decreased sensation of the bilateral feet.  Patient states this is chronic and unchanged due  to her peripheral neuropathy.  Sensation otherwise intact to soft touch of the bilateral lower extremities.  Ambulates with  an antalgic gait with the assistance of a cane.  Unable to heel walk or toe walk but states this is baseline for her.  Skin: Skin is warm and dry. No erythema.  Psychiatric: She has a normal mood and affect. Her behavior is normal.  Nursing note and vitals reviewed.    ED Treatments / Results  Labs (all labs ordered are listed, but only abnormal results are displayed) Labs Reviewed - No data to display  EKG None  Radiology No results found.  Procedures Procedures (including critical care time)  Medications Ordered in ED Medications  acetaminophen (TYLENOL) tablet 650 mg (650 mg Oral Given 09/19/17 1553)     Initial Impression / Assessment and Plan / ED Course  I have reviewed the triage vital signs and the nursing notes.  Pertinent labs & imaging results that were available during my care of the patient were reviewed by me and considered in my medical decision making (see chart for details).  Patient presents for evaluation of worsening chronic hip pain, left worse than right.  She is afebrile, mildly hypertensive in the ED although recheck with manual blood pressure cuff shows she is normotensive.  She is neurovascularly intact.  She is able to ambulate despite pain at baseline with her cane.  She does have a walker at home.  No evidence of cellulitis, necrotizing fasciitis, or occult fracture.  Recent radiographs show arthritis. With recent history of flight and a 5 [place] drive, we will obtain DVT study to rule out acute DVT causing worsening pain.  Her daughters do state that she has been much more active in the past couple of weeks which could also be contributing to her worsening symptoms.  She is on Cymbalta and Lyrica and declines narcotic pain medicine or muscle relaxer while in the ED.  She was given Tylenol with some improvement.  DVT  study negative.  We had a long discussion regarding nonpharmacological treatments of her pain including yoga, range of motion exercises, aquatic therapy, and physical therapy.  She has an appointment for follow-up with her orthopedist Dr. Lyla Glassing tomorrow and I encouraged her to keep this appointment and discuss her hip pain with him.  She will take Tylenol every 6 hours for the next few days and apply heat intermittently.  Discussed strict ED return precautions.  Patient and patient's daughters verbalized understanding of and agreement with plan and patient is stable for discharge home at this time.  Final Clinical Impressions(s) / ED Diagnoses   Final diagnoses:  Left hip pain    ED Discharge Orders    None       Debroah Baller 09/20/17 1508    Milton Ferguson, MD 09/24/17 1711

## 2017-09-19 NOTE — ED Triage Notes (Signed)
Pt reports having bilat hip and leg pains that have gotten worse over couple days. Reports was told having arthritis in her hip. And started on new medications on 4/23 but not helping.

## 2017-09-21 DIAGNOSIS — M545 Low back pain: Secondary | ICD-10-CM | POA: Diagnosis not present

## 2017-09-21 DIAGNOSIS — M25561 Pain in right knee: Secondary | ICD-10-CM | POA: Diagnosis not present

## 2017-09-25 DIAGNOSIS — M545 Low back pain: Secondary | ICD-10-CM | POA: Diagnosis not present

## 2017-10-04 DIAGNOSIS — M51369 Other intervertebral disc degeneration, lumbar region without mention of lumbar back pain or lower extremity pain: Secondary | ICD-10-CM | POA: Insufficient documentation

## 2017-10-04 DIAGNOSIS — M5136 Other intervertebral disc degeneration, lumbar region: Secondary | ICD-10-CM | POA: Diagnosis not present

## 2017-10-04 DIAGNOSIS — M48061 Spinal stenosis, lumbar region without neurogenic claudication: Secondary | ICD-10-CM | POA: Insufficient documentation

## 2017-10-06 ENCOUNTER — Telehealth: Payer: Self-pay

## 2017-10-06 ENCOUNTER — Telehealth: Payer: Self-pay | Admitting: *Deleted

## 2017-10-06 NOTE — Telephone Encounter (Signed)
Received call from pt stating that she is calling for information.  She states she had an MRI done on her back last week & it showed a cyst on her kidney & she wants to know if this is of any concern.  Call back # is [phone].  Message routed to Dr Feng/Pod RN

## 2017-10-06 NOTE — Telephone Encounter (Addendum)
Patient will call her Orthopedic office and have them fax [place] a copy of her MRI results.    Patient was given the fax number.   Received copy of MRI results placed on Dr. Ernestina Penna desk for review.

## 2017-10-06 NOTE — Telephone Encounter (Signed)
Her MRI is not in Epic, likely done outside. Please call pt and get a report, I will review it. She can always discussed this with her PCP or ordering physician. Thanks.   Truitt Merle MD

## 2017-10-06 NOTE — Telephone Encounter (Signed)
Called pt back & she had this done at [facility] & they were supposed to fax to Dr Burr Medico today.  Informed pt that she is out of office & we will watch for report.  She wanted Dr Ernestina Penna opinion.

## 2017-10-07 ENCOUNTER — Telehealth: Payer: Self-pay

## 2017-10-07 NOTE — Telephone Encounter (Signed)
Notified patient per Dr. Burr Medico MRI on 06/19/16 showed kidney cysts.  No concerns benign cyst, patient verbalized an understanding.

## 2017-10-11 DIAGNOSIS — M79605 Pain in left leg: Secondary | ICD-10-CM | POA: Diagnosis not present

## 2017-10-11 DIAGNOSIS — M545 Low back pain: Secondary | ICD-10-CM | POA: Diagnosis not present

## 2017-10-11 DIAGNOSIS — M79604 Pain in right leg: Secondary | ICD-10-CM | POA: Diagnosis not present

## 2017-10-11 DIAGNOSIS — M6281 Muscle weakness (generalized): Secondary | ICD-10-CM | POA: Diagnosis not present

## 2017-10-14 DIAGNOSIS — M79604 Pain in right leg: Secondary | ICD-10-CM | POA: Diagnosis not present

## 2017-10-14 DIAGNOSIS — M6281 Muscle weakness (generalized): Secondary | ICD-10-CM | POA: Diagnosis not present

## 2017-10-14 DIAGNOSIS — E119 Type 2 diabetes mellitus without complications: Secondary | ICD-10-CM | POA: Diagnosis not present

## 2017-10-14 DIAGNOSIS — M545 Low back pain: Secondary | ICD-10-CM | POA: Diagnosis not present

## 2017-10-14 DIAGNOSIS — E559 Vitamin D deficiency, unspecified: Secondary | ICD-10-CM | POA: Diagnosis not present

## 2017-10-14 DIAGNOSIS — M79605 Pain in left leg: Secondary | ICD-10-CM | POA: Diagnosis not present

## 2017-10-14 DIAGNOSIS — I10 Essential (primary) hypertension: Secondary | ICD-10-CM | POA: Diagnosis not present

## 2017-10-19 DIAGNOSIS — M79605 Pain in left leg: Secondary | ICD-10-CM | POA: Diagnosis not present

## 2017-10-19 DIAGNOSIS — M79604 Pain in right leg: Secondary | ICD-10-CM | POA: Diagnosis not present

## 2017-10-19 DIAGNOSIS — M545 Low back pain: Secondary | ICD-10-CM | POA: Diagnosis not present

## 2017-10-19 DIAGNOSIS — M6281 Muscle weakness (generalized): Secondary | ICD-10-CM | POA: Diagnosis not present

## 2017-10-21 DIAGNOSIS — I10 Essential (primary) hypertension: Secondary | ICD-10-CM | POA: Diagnosis not present

## 2017-10-21 DIAGNOSIS — K219 Gastro-esophageal reflux disease without esophagitis: Secondary | ICD-10-CM | POA: Diagnosis not present

## 2017-10-21 DIAGNOSIS — E785 Hyperlipidemia, unspecified: Secondary | ICD-10-CM | POA: Diagnosis not present

## 2017-10-21 DIAGNOSIS — M797 Fibromyalgia: Secondary | ICD-10-CM | POA: Diagnosis not present

## 2017-10-21 DIAGNOSIS — E119 Type 2 diabetes mellitus without complications: Secondary | ICD-10-CM | POA: Diagnosis not present

## 2017-10-21 DIAGNOSIS — Z0000 Encounter for general adult medical examination without abnormal findings: Secondary | ICD-10-CM | POA: Diagnosis not present

## 2017-10-22 DIAGNOSIS — M545 Low back pain: Secondary | ICD-10-CM | POA: Diagnosis not present

## 2017-10-22 DIAGNOSIS — M79604 Pain in right leg: Secondary | ICD-10-CM | POA: Diagnosis not present

## 2017-10-22 DIAGNOSIS — M6281 Muscle weakness (generalized): Secondary | ICD-10-CM | POA: Diagnosis not present

## 2017-10-22 DIAGNOSIS — M79605 Pain in left leg: Secondary | ICD-10-CM | POA: Diagnosis not present

## 2017-10-26 DIAGNOSIS — L720 Epidermal cyst: Secondary | ICD-10-CM | POA: Diagnosis not present

## 2017-10-27 DIAGNOSIS — M79605 Pain in left leg: Secondary | ICD-10-CM | POA: Diagnosis not present

## 2017-10-27 DIAGNOSIS — M6281 Muscle weakness (generalized): Secondary | ICD-10-CM | POA: Diagnosis not present

## 2017-10-27 DIAGNOSIS — M79604 Pain in right leg: Secondary | ICD-10-CM | POA: Diagnosis not present

## 2017-10-27 DIAGNOSIS — M545 Low back pain: Secondary | ICD-10-CM | POA: Diagnosis not present

## 2017-10-29 DIAGNOSIS — M79604 Pain in right leg: Secondary | ICD-10-CM | POA: Diagnosis not present

## 2017-10-29 DIAGNOSIS — M6281 Muscle weakness (generalized): Secondary | ICD-10-CM | POA: Diagnosis not present

## 2017-10-29 DIAGNOSIS — M79605 Pain in left leg: Secondary | ICD-10-CM | POA: Diagnosis not present

## 2017-10-29 DIAGNOSIS — M545 Low back pain: Secondary | ICD-10-CM | POA: Diagnosis not present

## 2017-11-03 DIAGNOSIS — M545 Low back pain: Secondary | ICD-10-CM | POA: Diagnosis not present

## 2017-11-03 DIAGNOSIS — M79605 Pain in left leg: Secondary | ICD-10-CM | POA: Diagnosis not present

## 2017-11-03 DIAGNOSIS — M79604 Pain in right leg: Secondary | ICD-10-CM | POA: Diagnosis not present

## 2017-11-03 DIAGNOSIS — M6281 Muscle weakness (generalized): Secondary | ICD-10-CM | POA: Diagnosis not present

## 2017-11-04 DIAGNOSIS — Z961 Presence of intraocular lens: Secondary | ICD-10-CM | POA: Diagnosis not present

## 2017-11-04 DIAGNOSIS — H524 Presbyopia: Secondary | ICD-10-CM | POA: Diagnosis not present

## 2017-11-04 DIAGNOSIS — E119 Type 2 diabetes mellitus without complications: Secondary | ICD-10-CM | POA: Diagnosis not present

## 2017-11-05 DIAGNOSIS — M79604 Pain in right leg: Secondary | ICD-10-CM | POA: Diagnosis not present

## 2017-11-05 DIAGNOSIS — M6281 Muscle weakness (generalized): Secondary | ICD-10-CM | POA: Diagnosis not present

## 2017-11-05 DIAGNOSIS — M545 Low back pain: Secondary | ICD-10-CM | POA: Diagnosis not present

## 2017-11-05 DIAGNOSIS — M79605 Pain in left leg: Secondary | ICD-10-CM | POA: Diagnosis not present

## 2017-11-09 DIAGNOSIS — M79604 Pain in right leg: Secondary | ICD-10-CM | POA: Diagnosis not present

## 2017-11-09 DIAGNOSIS — M79605 Pain in left leg: Secondary | ICD-10-CM | POA: Diagnosis not present

## 2017-11-09 DIAGNOSIS — M545 Low back pain: Secondary | ICD-10-CM | POA: Diagnosis not present

## 2017-11-09 DIAGNOSIS — M6281 Muscle weakness (generalized): Secondary | ICD-10-CM | POA: Diagnosis not present

## 2017-11-11 ENCOUNTER — Other Ambulatory Visit: Payer: Self-pay | Admitting: Nurse Practitioner

## 2017-11-11 DIAGNOSIS — E2839 Other primary ovarian failure: Secondary | ICD-10-CM

## 2017-11-12 ENCOUNTER — Other Ambulatory Visit: Payer: Medicare Other

## 2017-11-12 ENCOUNTER — Ambulatory Visit
Admission: RE | Admit: 2017-11-12 | Discharge: 2017-11-12 | Disposition: A | Discharge status: Home / Self Care | Payer: Medicare Other | Source: Ambulatory Visit | Attending: Nurse Practitioner | Admitting: Nurse Practitioner

## 2017-11-12 DIAGNOSIS — E2839 Other primary ovarian failure: Secondary | ICD-10-CM

## 2017-11-12 DIAGNOSIS — M810 Age-related osteoporosis without current pathological fracture: Secondary | ICD-10-CM | POA: Diagnosis not present

## 2017-11-12 DIAGNOSIS — C50411 Malignant neoplasm of upper-outer quadrant of right female breast: Secondary | ICD-10-CM

## 2017-11-12 DIAGNOSIS — Z171 Estrogen receptor negative status [ER-]: Principal | ICD-10-CM

## 2017-11-12 DIAGNOSIS — R928 Other abnormal and inconclusive findings on diagnostic imaging of breast: Secondary | ICD-10-CM | POA: Diagnosis not present

## 2017-11-12 DIAGNOSIS — M85851 Other specified disorders of bone density and structure, right thigh: Secondary | ICD-10-CM | POA: Diagnosis not present

## 2017-11-12 HISTORY — DX: Personal history of antineoplastic chemotherapy: Z92.21

## 2017-11-12 HISTORY — DX: Personal history of irradiation: Z92.3

## 2017-11-15 ENCOUNTER — Telehealth: Payer: Self-pay

## 2017-11-15 ENCOUNTER — Other Ambulatory Visit: Payer: Self-pay | Admitting: Hematology

## 2017-11-15 DIAGNOSIS — I119 Hypertensive heart disease without heart failure: Secondary | ICD-10-CM | POA: Diagnosis not present

## 2017-11-15 DIAGNOSIS — C50411 Malignant neoplasm of upper-outer quadrant of right female breast: Secondary | ICD-10-CM

## 2017-11-15 DIAGNOSIS — Z170 Estrogen receptor positive status [ER+]: Principal | ICD-10-CM

## 2017-11-15 DIAGNOSIS — E785 Hyperlipidemia, unspecified: Secondary | ICD-10-CM | POA: Diagnosis not present

## 2017-11-15 DIAGNOSIS — I080 Rheumatic disorders of both mitral and aortic valves: Secondary | ICD-10-CM | POA: Diagnosis not present

## 2017-11-15 DIAGNOSIS — E668 Other obesity: Secondary | ICD-10-CM | POA: Diagnosis not present

## 2017-11-15 MED ORDER — TRAMADOL HCL 50 MG PO TABS: ORAL_TABLET | ORAL | 0 refills | Status: DC

## 2017-11-15 NOTE — Telephone Encounter (Signed)
-----   Message from Truitt Merle, MD sent at 11/13/2017  4:13 PM EDT ----- Malachy Mood, please let pt know her know the DEXA result. She has osteoporosis, high risk for fracture. I will discuss medical treatment when I see her next time, or she can discuss with her PCP. Thanks  Truitt Merle  11/13/2017

## 2017-11-15 NOTE — Telephone Encounter (Signed)
Spoke with patient concerning her DEXA result.  Per Dr. Burr Medico she has osteoporosis which puts her at a high risk for fracture.  Patient states she has a lot of lower extremity pain and wants to know what she can take for that?    She has taken Tramadol in the past and wants to know if Dr. Burr Medico will considering sending some in to [facility].

## 2017-11-15 NOTE — Telephone Encounter (Signed)
Spoke with patient informed Tramadol sent into [facility], instructed to not take this at the same time as Cymbalta.  Patient verbalized an understanding.

## 2017-11-17 DIAGNOSIS — M79604 Pain in right leg: Secondary | ICD-10-CM | POA: Diagnosis not present

## 2017-11-17 DIAGNOSIS — M79605 Pain in left leg: Secondary | ICD-10-CM | POA: Diagnosis not present

## 2017-11-17 DIAGNOSIS — M545 Low back pain: Secondary | ICD-10-CM | POA: Diagnosis not present

## 2017-11-17 DIAGNOSIS — M6281 Muscle weakness (generalized): Secondary | ICD-10-CM | POA: Diagnosis not present

## 2017-11-19 DIAGNOSIS — M79605 Pain in left leg: Secondary | ICD-10-CM | POA: Diagnosis not present

## 2017-11-19 DIAGNOSIS — M545 Low back pain: Secondary | ICD-10-CM | POA: Diagnosis not present

## 2017-11-19 DIAGNOSIS — M6281 Muscle weakness (generalized): Secondary | ICD-10-CM | POA: Diagnosis not present

## 2017-11-19 DIAGNOSIS — M79604 Pain in right leg: Secondary | ICD-10-CM | POA: Diagnosis not present

## 2017-11-24 DIAGNOSIS — M6281 Muscle weakness (generalized): Secondary | ICD-10-CM | POA: Diagnosis not present

## 2017-11-24 DIAGNOSIS — M79604 Pain in right leg: Secondary | ICD-10-CM | POA: Diagnosis not present

## 2017-11-24 DIAGNOSIS — M79605 Pain in left leg: Secondary | ICD-10-CM | POA: Diagnosis not present

## 2017-11-24 DIAGNOSIS — M545 Low back pain: Secondary | ICD-10-CM | POA: Diagnosis not present

## 2017-11-29 DIAGNOSIS — M48061 Spinal stenosis, lumbar region without neurogenic claudication: Secondary | ICD-10-CM | POA: Diagnosis not present

## 2017-11-29 DIAGNOSIS — M5136 Other intervertebral disc degeneration, lumbar region: Secondary | ICD-10-CM | POA: Diagnosis not present

## 2017-12-01 DIAGNOSIS — M6281 Muscle weakness (generalized): Secondary | ICD-10-CM | POA: Diagnosis not present

## 2017-12-01 DIAGNOSIS — M79605 Pain in left leg: Secondary | ICD-10-CM | POA: Diagnosis not present

## 2017-12-01 DIAGNOSIS — M79604 Pain in right leg: Secondary | ICD-10-CM | POA: Diagnosis not present

## 2017-12-01 DIAGNOSIS — M545 Low back pain: Secondary | ICD-10-CM | POA: Diagnosis not present

## 2017-12-03 DIAGNOSIS — M79604 Pain in right leg: Secondary | ICD-10-CM | POA: Diagnosis not present

## 2017-12-03 DIAGNOSIS — M79605 Pain in left leg: Secondary | ICD-10-CM | POA: Diagnosis not present

## 2017-12-03 DIAGNOSIS — M545 Low back pain: Secondary | ICD-10-CM | POA: Diagnosis not present

## 2017-12-03 DIAGNOSIS — M6281 Muscle weakness (generalized): Secondary | ICD-10-CM | POA: Diagnosis not present

## 2017-12-07 ENCOUNTER — Other Ambulatory Visit: Payer: Self-pay | Admitting: Nurse Practitioner

## 2017-12-07 DIAGNOSIS — M79604 Pain in right leg: Secondary | ICD-10-CM

## 2017-12-07 DIAGNOSIS — M79605 Pain in left leg: Principal | ICD-10-CM

## 2017-12-08 DIAGNOSIS — M79605 Pain in left leg: Secondary | ICD-10-CM | POA: Diagnosis not present

## 2017-12-08 DIAGNOSIS — M6281 Muscle weakness (generalized): Secondary | ICD-10-CM | POA: Diagnosis not present

## 2017-12-08 DIAGNOSIS — M79604 Pain in right leg: Secondary | ICD-10-CM | POA: Diagnosis not present

## 2017-12-08 DIAGNOSIS — M545 Low back pain: Secondary | ICD-10-CM | POA: Diagnosis not present

## 2017-12-10 ENCOUNTER — Other Ambulatory Visit: Payer: Self-pay | Admitting: Hematology

## 2017-12-10 ENCOUNTER — Other Ambulatory Visit: Payer: Self-pay

## 2017-12-10 DIAGNOSIS — C50411 Malignant neoplasm of upper-outer quadrant of right female breast: Secondary | ICD-10-CM

## 2017-12-10 DIAGNOSIS — Z170 Estrogen receptor positive status [ER+]: Principal | ICD-10-CM

## 2017-12-10 DIAGNOSIS — M6281 Muscle weakness (generalized): Secondary | ICD-10-CM | POA: Diagnosis not present

## 2017-12-10 DIAGNOSIS — M79605 Pain in left leg: Secondary | ICD-10-CM | POA: Diagnosis not present

## 2017-12-10 DIAGNOSIS — M545 Low back pain: Secondary | ICD-10-CM | POA: Diagnosis not present

## 2017-12-10 DIAGNOSIS — M79604 Pain in right leg: Secondary | ICD-10-CM | POA: Diagnosis not present

## 2017-12-10 MED ORDER — TRAMADOL HCL 50 MG PO TABS: ORAL_TABLET | ORAL | 0 refills | Status: DC

## 2017-12-13 DIAGNOSIS — M6281 Muscle weakness (generalized): Secondary | ICD-10-CM | POA: Diagnosis not present

## 2017-12-13 DIAGNOSIS — M545 Low back pain: Secondary | ICD-10-CM | POA: Diagnosis not present

## 2017-12-13 DIAGNOSIS — M79605 Pain in left leg: Secondary | ICD-10-CM | POA: Diagnosis not present

## 2017-12-13 DIAGNOSIS — M79604 Pain in right leg: Secondary | ICD-10-CM | POA: Diagnosis not present

## 2017-12-14 DIAGNOSIS — M5136 Other intervertebral disc degeneration, lumbar region: Secondary | ICD-10-CM | POA: Diagnosis not present

## 2017-12-14 DIAGNOSIS — M48061 Spinal stenosis, lumbar region without neurogenic claudication: Secondary | ICD-10-CM | POA: Diagnosis not present

## 2017-12-17 DIAGNOSIS — M6281 Muscle weakness (generalized): Secondary | ICD-10-CM | POA: Diagnosis not present

## 2017-12-17 DIAGNOSIS — M79604 Pain in right leg: Secondary | ICD-10-CM | POA: Diagnosis not present

## 2017-12-17 DIAGNOSIS — M545 Low back pain: Secondary | ICD-10-CM | POA: Diagnosis not present

## 2017-12-17 DIAGNOSIS — M79605 Pain in left leg: Secondary | ICD-10-CM | POA: Diagnosis not present

## 2017-12-24 DIAGNOSIS — M545 Low back pain: Secondary | ICD-10-CM | POA: Diagnosis not present

## 2017-12-24 DIAGNOSIS — M6281 Muscle weakness (generalized): Secondary | ICD-10-CM | POA: Diagnosis not present

## 2017-12-24 DIAGNOSIS — M79605 Pain in left leg: Secondary | ICD-10-CM | POA: Diagnosis not present

## 2017-12-24 DIAGNOSIS — M79604 Pain in right leg: Secondary | ICD-10-CM | POA: Diagnosis not present

## 2017-12-28 DIAGNOSIS — I10 Essential (primary) hypertension: Secondary | ICD-10-CM | POA: Diagnosis not present

## 2017-12-28 DIAGNOSIS — E7800 Pure hypercholesterolemia, unspecified: Secondary | ICD-10-CM | POA: Diagnosis not present

## 2017-12-28 DIAGNOSIS — M545 Low back pain: Secondary | ICD-10-CM | POA: Diagnosis not present

## 2017-12-29 DIAGNOSIS — M545 Low back pain: Secondary | ICD-10-CM | POA: Diagnosis not present

## 2017-12-29 DIAGNOSIS — M79605 Pain in left leg: Secondary | ICD-10-CM | POA: Diagnosis not present

## 2017-12-29 DIAGNOSIS — M79604 Pain in right leg: Secondary | ICD-10-CM | POA: Diagnosis not present

## 2017-12-29 DIAGNOSIS — M6281 Muscle weakness (generalized): Secondary | ICD-10-CM | POA: Diagnosis not present

## 2017-12-31 DIAGNOSIS — M79604 Pain in right leg: Secondary | ICD-10-CM | POA: Diagnosis not present

## 2017-12-31 DIAGNOSIS — M79605 Pain in left leg: Secondary | ICD-10-CM | POA: Diagnosis not present

## 2017-12-31 DIAGNOSIS — M6281 Muscle weakness (generalized): Secondary | ICD-10-CM | POA: Diagnosis not present

## 2017-12-31 DIAGNOSIS — M545 Low back pain: Secondary | ICD-10-CM | POA: Diagnosis not present

## 2018-01-05 DIAGNOSIS — M6281 Muscle weakness (generalized): Secondary | ICD-10-CM | POA: Diagnosis not present

## 2018-01-05 DIAGNOSIS — M545 Low back pain: Secondary | ICD-10-CM | POA: Diagnosis not present

## 2018-01-05 DIAGNOSIS — M79604 Pain in right leg: Secondary | ICD-10-CM | POA: Diagnosis not present

## 2018-01-05 DIAGNOSIS — M79605 Pain in left leg: Secondary | ICD-10-CM | POA: Diagnosis not present

## 2018-01-07 DIAGNOSIS — M79604 Pain in right leg: Secondary | ICD-10-CM | POA: Diagnosis not present

## 2018-01-07 DIAGNOSIS — M6281 Muscle weakness (generalized): Secondary | ICD-10-CM | POA: Diagnosis not present

## 2018-01-07 DIAGNOSIS — M79605 Pain in left leg: Secondary | ICD-10-CM | POA: Diagnosis not present

## 2018-01-07 DIAGNOSIS — M545 Low back pain: Secondary | ICD-10-CM | POA: Diagnosis not present

## 2018-01-10 DIAGNOSIS — M5136 Other intervertebral disc degeneration, lumbar region: Secondary | ICD-10-CM | POA: Diagnosis not present

## 2018-01-11 DIAGNOSIS — M79604 Pain in right leg: Secondary | ICD-10-CM | POA: Diagnosis not present

## 2018-01-11 DIAGNOSIS — M545 Low back pain: Secondary | ICD-10-CM | POA: Diagnosis not present

## 2018-01-11 DIAGNOSIS — M79605 Pain in left leg: Secondary | ICD-10-CM | POA: Diagnosis not present

## 2018-01-11 DIAGNOSIS — M6281 Muscle weakness (generalized): Secondary | ICD-10-CM | POA: Diagnosis not present

## 2018-01-19 DIAGNOSIS — M545 Low back pain: Secondary | ICD-10-CM | POA: Diagnosis not present

## 2018-01-19 DIAGNOSIS — M79605 Pain in left leg: Secondary | ICD-10-CM | POA: Diagnosis not present

## 2018-01-19 DIAGNOSIS — M79604 Pain in right leg: Secondary | ICD-10-CM | POA: Diagnosis not present

## 2018-01-19 DIAGNOSIS — M6281 Muscle weakness (generalized): Secondary | ICD-10-CM | POA: Diagnosis not present

## 2018-01-25 DIAGNOSIS — E559 Vitamin D deficiency, unspecified: Secondary | ICD-10-CM | POA: Diagnosis not present

## 2018-01-25 DIAGNOSIS — M797 Fibromyalgia: Secondary | ICD-10-CM | POA: Diagnosis not present

## 2018-01-25 DIAGNOSIS — E119 Type 2 diabetes mellitus without complications: Secondary | ICD-10-CM | POA: Diagnosis not present

## 2018-01-25 DIAGNOSIS — I10 Essential (primary) hypertension: Secondary | ICD-10-CM | POA: Diagnosis not present

## 2018-01-26 DIAGNOSIS — Z23 Encounter for immunization: Secondary | ICD-10-CM | POA: Diagnosis not present

## 2018-02-21 ENCOUNTER — Telehealth: Payer: Self-pay | Admitting: Hematology

## 2018-02-21 NOTE — Telephone Encounter (Signed)
R/s appt per 9/30 sch message - left message with appt date and time.

## 2018-02-22 ENCOUNTER — Telehealth: Payer: Self-pay | Admitting: Hematology

## 2018-02-22 NOTE — Telephone Encounter (Signed)
Per message from Rn cheryl - pt unable to come in 10/3 - left message for patient to call back to r/s appt

## 2018-02-22 NOTE — Telephone Encounter (Signed)
Tried to reach regarding voicemail. I ,did leave a message

## 2018-02-23 ENCOUNTER — Inpatient Hospital Stay: Payer: Medicare Other | Admitting: Hematology

## 2018-02-23 ENCOUNTER — Inpatient Hospital Stay: Payer: Medicare Other

## 2018-02-24 ENCOUNTER — Inpatient Hospital Stay: Payer: Medicare Other

## 2018-02-24 ENCOUNTER — Inpatient Hospital Stay: Payer: Medicare Other | Admitting: Hematology

## 2018-03-01 DIAGNOSIS — Z471 Aftercare following joint replacement surgery: Secondary | ICD-10-CM | POA: Diagnosis not present

## 2018-03-01 DIAGNOSIS — M1711 Unilateral primary osteoarthritis, right knee: Secondary | ICD-10-CM | POA: Diagnosis not present

## 2018-03-01 DIAGNOSIS — Z96651 Presence of right artificial knee joint: Secondary | ICD-10-CM | POA: Diagnosis not present

## 2018-03-01 DIAGNOSIS — M7051 Other bursitis of knee, right knee: Secondary | ICD-10-CM | POA: Diagnosis not present

## 2018-03-07 ENCOUNTER — Telehealth: Payer: Self-pay

## 2018-03-07 ENCOUNTER — Inpatient Hospital Stay (HOSPITAL_BASED_OUTPATIENT_CLINIC_OR_DEPARTMENT_OTHER): Payer: Medicare Other | Admitting: Hematology

## 2018-03-07 ENCOUNTER — Inpatient Hospital Stay: Payer: Medicare Other | Attending: Hematology

## 2018-03-07 DIAGNOSIS — Z853 Personal history of malignant neoplasm of breast: Secondary | ICD-10-CM | POA: Insufficient documentation

## 2018-03-07 DIAGNOSIS — D649 Anemia, unspecified: Secondary | ICD-10-CM

## 2018-03-07 DIAGNOSIS — I10 Essential (primary) hypertension: Secondary | ICD-10-CM

## 2018-03-07 DIAGNOSIS — M810 Age-related osteoporosis without current pathological fracture: Secondary | ICD-10-CM

## 2018-03-07 DIAGNOSIS — M069 Rheumatoid arthritis, unspecified: Secondary | ICD-10-CM | POA: Diagnosis not present

## 2018-03-07 DIAGNOSIS — D472 Monoclonal gammopathy: Secondary | ICD-10-CM

## 2018-03-07 DIAGNOSIS — C50412 Malignant neoplasm of upper-outer quadrant of left female breast: Secondary | ICD-10-CM

## 2018-03-07 DIAGNOSIS — Z171 Estrogen receptor negative status [ER-]: Principal | ICD-10-CM

## 2018-03-07 DIAGNOSIS — C50411 Malignant neoplasm of upper-outer quadrant of right female breast: Secondary | ICD-10-CM

## 2018-03-07 LAB — CBC WITH DIFFERENTIAL/PLATELET
Abs Immature Granulocytes: 0.01 10*3/uL (ref 0.00–0.07)
BASOS PCT: 1 %
Basophils Absolute: 0.1 10*3/uL (ref 0.0–0.1)
EOS ABS: 0.2 10*3/uL (ref 0.0–0.5)
Eosinophils Relative: 4 %
HCT: 36.4 % (ref 36.0–46.0)
Hemoglobin: 11.3 g/dL — ABNORMAL LOW (ref 12.0–15.0)
Immature Granulocytes: 0 %
Lymphocytes Relative: 20 %
Lymphs Abs: 1.1 10*3/uL (ref 0.7–4.0)
MCH: 26.7 pg (ref 26.0–34.0)
MCHC: 31 g/dL (ref 30.0–36.0)
MCV: 85.8 fL (ref 80.0–100.0)
MONO ABS: 0.5 10*3/uL (ref 0.1–1.0)
MONOS PCT: 10 %
NEUTROS PCT: 65 %
Neutro Abs: 3.5 10*3/uL (ref 1.7–7.7)
PLATELETS: 240 10*3/uL (ref 150–400)
RBC: 4.24 MIL/uL (ref 3.87–5.11)
RDW: 14.4 % (ref 11.5–15.5)
WBC: 5.4 10*3/uL (ref 4.0–10.5)
nRBC: 0 % (ref 0.0–0.2)

## 2018-03-07 LAB — COMPREHENSIVE METABOLIC PANEL
ALBUMIN: 3.5 g/dL (ref 3.5–5.0)
ALK PHOS: 88 U/L (ref 38–126)
ALT: 11 U/L (ref 0–44)
AST: 21 U/L (ref 15–41)
Anion gap: 11 (ref 5–15)
BILIRUBIN TOTAL: 0.6 mg/dL (ref 0.3–1.2)
BUN: 17 mg/dL (ref 8–23)
CALCIUM: 9.4 mg/dL (ref 8.9–10.3)
CO2: 28 mmol/L (ref 22–32)
CREATININE: 0.97 mg/dL (ref 0.44–1.00)
Chloride: 105 mmol/L (ref 98–111)
GFR calc Af Amer: >60 mL/min (ref >60)
GFR calc non Af Amer: 53 mL/min — ABNORMAL LOW (ref >60)
GLUCOSE: 113 mg/dL — AB (ref 70–99)
Potassium: 3.8 mmol/L (ref 3.5–5.1)
SODIUM: 144 mmol/L (ref 135–145)
TOTAL PROTEIN: 7.3 g/dL (ref 6.5–8.1)

## 2018-03-07 NOTE — Telephone Encounter (Signed)
Printed avs and calender of upcoming appointment. Per 10/14 verbal return los

## 2018-03-07 NOTE — Progress Notes (Signed)
[place]  Telephone:([phone]) [phone] Fax:(336) [phone]  Clinic follow up Note   Patient Care Team: [name], MD as PCP - General (Internal Medicine) Valinda Party, MD as Referring Physician (Rheumatology) Rolm Bookbinder, MD as Consulting Physician (General Surgery) Truitt Merle, MD as Consulting Physician (Hematology) Delice Bison Charlestine Massed, NP as Nurse Practitioner (Hematology and Oncology)   Date of Service:  03/07/2018  CHIEF COMPLAINTS:  Follow up left breast cancer   Oncology History   Malignant neoplasm of upper outer quadrant of female breast [facility])   Staging form: Breast, AJCC 7th Edition     Pathologic stage from 11/14/2015: Stage IA (T1c, N0, cM0) - Signed by Truitt Merle, MD on 11/25/2015     Clinical stage from 11/20/2015: Stage IA (T1c, N0, M0) - Signed by Curt Bears, MD on 11/20/2015       Malignant neoplasm of upper outer quadrant of female breast ([place])   10/09/2015 Mammogram    Screening bilateral mammograms showed a possible left breast mass. Diagnostic mammogram and ultrasound showed a 7 x 7 x 7 mm mass in the left breast at 11:00 position.    10/22/2015 Receptors her2    ER negative, PR negative, HER-2 not amplified    10/22/2015 Initial Biopsy    Left breast needle core biopsy at the 1:00 position showed invasive ductal carcinoma.    10/22/2015 Initial Diagnosis    Malignant neoplasm of upper outer quadrant of female breast ([place])    11/14/2015 Surgery    Left breast lumpectomy and sentinel lymph node biopsy [name])    11/14/2015 Pathology Results    Left breast lumpectomy showed invasive ductal carcinoma, grade 3, 1.1 cm, resection margins were negative, sentinel lymph node biopsy showed benign breast parenchyma, lymph node tissue not identified. Repeated ER PR and HER-2 were all negative.    12/20/2015 - 03/13/2016 Chemotherapy    Weekly Taxol 80 mg/m x 2 cycles, then changed to Abraxane 100 mg/m for cycles 3-12 due to infusion  reaction. Abraxane dose-reduced cycle #9, then again for cycles #11 & #12 due to neuropathy. [total 12 cycles chemo]     04/01/2016 - 05/11/2016 Radiation Therapy    Adjuvant breast radiation ([place]). Left breast: 50.4 Gy in 28 fractions    06/19/2016 Imaging    MR Lumbar Spine without contrast  IMPRESSION: 1. Diffuse lumbar spondylosis with discogenic and facet degenerative changes progressed from 2010 lumbar MRI. 2. Multilevel canal stenosis is mild-to-moderate at L2-3, moderate L3-4, and mild at L4-5. 3. Multiple levels of mild and moderate neural foraminal narrowing with severe foraminal narrowing at the right L3-4 and L4-5 levels.    11/12/2017 Imaging    11/12/2017 DEXA ASSESSMENT: The BMD measured at Forearm Radius 33% is 0.648 g/cm2 with a T-score of -2.7. This patient is considered osteoporotic according to [facility]) criteria.    11/12/2017 Mammogram    11/12/2017 Diagnostic Mammogram IMPRESSION: No mammographic evidence of malignancy in either breast, status post left lumpectomy.     HISTORY OF PRESENTING ILLNESS:  Julie [name] 84 y.o. female is here because of her recently diagnosed left breast cancer. She has been seen my partner Dr. Julien Nordmann for MGUS, and was referred to me to discuss her adjuvant therapy for breast cancer. She is accompanied by her daughter to the clinic today.  Her breast cancer was discovered by screening mammogram. She has no palpable breast mass, skin change or nipple discharge. She denies any new constitutional symptoms. The diagnostic mammogram  and ultrasound showed a 7 mm mass in the left breast, biopsy showed invasive ductal carcinoma, triple negative. She was seen by breast surgeon Dr. [name], and underwent left rest lumpectomy and sentinel lymph node biopsy on 11/14/2015.   She had a multiple orthopedic surgeries in the past, takes tramadol as needed. She lives with her daughter, pretty independent, does not exercise  regularly, but it goes out for shopping, playing cards, etc.  GYN HISTORY  Menarchal: 47 LMP: 64 (hysterectomy) Contraceptive: 2 HRT: 1 yr  G5P5: she did breast feeding to 2 children   CURRENT THERAPY: Surveillance  INTERIM HISTORY:  Julie [name] returns for follow-up of her left breast cancer. She was last seen by me 1 year ago. In interim she was seen by NP Lacie 6 months ago. She presents to the clinic today with her daughter. She is doing well. She complains of generalized body pain. She has tingling in her legs and buttock. She experiences back pain and was informed that her back is "compressing". She states that she uses a cane to walk because of gait disturbance and frequent falls that she relates to her back pain. She wants to try accupuncture for her back pain.  She states that her nails hurt when she presses them.    MEDICAL HISTORY:  Past Medical History:  Diagnosis Date  . Anemia   . Anxiety    takes Klonopin at bedtime  . Arthritis    takes Methotrexate weekly and Prednisone  . Blood transfusion    no abnormal reaction noted  . Cancer (HCC)    breast   . Cataracts, bilateral    immature  . Complication of anesthesia    wakes up during surgery  . Depression   . Diabetes mellitus without complication ([place])   . Dyspnea    with exertion since chemo   . Fibromyalgia    takes Lyrica daily  . GERD (gastroesophageal reflux disease)    takes Nexium daily  . Glaucoma   . Hard of hearing   . Heart murmur   . Histoplasmosis   . History of colon polyps   . History of gout   . History of hiatal hernia   . History of shingles   . Hyperlipidemia   . Hypertension    takes Amlodipine,Metoprolol,and Losartan daily  . Incontinence of bowel   . Interstitial cystitis   . Joint pain   . Low back pain   . Nocturia   . Peripheral neuropathy   . Peripheral neuropathy   . Personal history of chemotherapy   . Personal history of radiation therapy   . PONV (postoperative nausea  and vomiting)   . Ulcer 1970   duodenal  . Urinary frequency   . Urinary urgency   . Weakness    numbness in both hands and feet    SURGICAL HISTORY: Past Surgical History:  Procedure Laterality Date  . ABDOMINAL HYSTERECTOMY    . accupuncture  3 yrs ago  . ANKLE FUSION Left 05/10/2014   Procedure: LEFT ANKLE ARTHRODESIS ;  Surgeon: Wylene Simmer, MD;  Location: [place];  Service: Orthopedics;  Laterality: Left;  . ANKLE SURGERY Left   . arm surgery Left    radius  . bladder tacked     . BREAST LUMPECTOMY Left   . CERVICAL FUSION    . CHOLECYSTECTOMY    . COLONOSCOPY    . CYSTOCELE REPAIR    . DILATION AND CURETTAGE OF UTERUS    .  ESOPHAGOGASTRODUODENOSCOPY    . PORT A CATH REVISION N/A 12/27/2015   Procedure: PORT A CATH REVISION;  Surgeon: Rolm Bookbinder, MD;  Location: [place];  Service: General;  Laterality: N/A;  . PORT-A-CATH REMOVAL N/A 03/25/2016   Procedure: REMOVAL PORT-A-CATH;  Surgeon: Rolm Bookbinder, MD;  Location: [facility];  Service: General;  Laterality: N/A;  . PORTACATH PLACEMENT Right 12/27/2015   Procedure: INSERTION PORT-A-CATH WITH ULTRASOUND ;  Surgeon: Rolm Bookbinder, MD;  Location: [place];  Service: General;  Laterality: Right;  . RADIOACTIVE SEED GUIDED PARTIAL MASTECTOMY WITH AXILLARY SENTINEL LYMPH NODE BIOPSY Left 11/14/2015   Procedure: RADIOACTIVE SEED GUIDED LEFT BREAST LUMPECTOMY WITH AXILLARY SENTINEL LYMPH NODE BIOPSY;  Surgeon: Rolm Bookbinder, MD;  Location: [place];  Service: General;  Laterality: Left;  RADIOACTIVE SEED GUIDED LEFT BREAST LUMPECTOMY WITH AXILLARY SENTINEL LYMPH NODE BIOPSY  . SALPINGOOPHORECTOMY Bilateral   . TOTAL KNEE ARTHROPLASTY Right 02/28/2015   Procedure: RIGHT TOTAL KNEE ARTHROPLASTY;  Surgeon: Rod Can, MD;  Location: [facility];  Service: Orthopedics;  Laterality: Right;  . UPPER GASTROINTESTINAL ENDOSCOPY    . VAGINAL HYSTERECTOMY      SOCIAL  HISTORY: Social History   Socioeconomic History  . Marital status: Divorced    Spouse name: Not on file  . Number of children: 5  . Years of education: some college  . Highest education level: Not on file  Occupational History  . Occupation: Retired  Scientific laboratory technician  . Financial resource strain: Not on file  . Food insecurity:    Worry: Not on file    Inability: Not on file  . Transportation needs:    Medical: Not on file    Non-medical: Not on file  Tobacco Use  . Smoking status: Never Smoker  . Smokeless tobacco: Never Used  Substance and Sexual Activity  . Alcohol use: No    Comment: IN [place] ONLY  . Drug use: No  . Sexual activity: Not on file  Lifestyle  . Physical activity:    Days per week: Not on file    Minutes per session: Not on file  . Stress: Not on file  Relationships  . Social connections:    Talks on phone: Not on file    Gets together: Not on file    Attends religious service: Not on file    Active member of club or organization: Not on file    Attends meetings of clubs or organizations: Not on file    Relationship status: Not on file  . Intimate partner violence:    Fear of current or ex partner: Not on file    Emotionally abused: Not on file    Physically abused: Not on file    Forced sexual activity: Not on file  Other Topics Concern  . Not on file  Social History Narrative   Lives at home with her daughter.   Right-handed.   0.5 cup caffeine per day.    FAMILY HISTORY: Family History  Problem Relation Age of Onset  . Colon cancer Sister 6  . Esophageal cancer Brother 51  . Breast cancer Mother   . Brain cancer Other     ALLERGIES:  is allergic to codeine; other; and aspirin.  MEDICATIONS:  Current Outpatient Medications  Medication Sig Dispense Refill  . amLODipine (NORVASC) 10 MG tablet Take 10 mg by mouth every morning.     . Biotin 10000 MCG TABS Take 1 tablet by mouth daily.    [name]  calcium-vitamin D (OSCAL WITH D) 500-200  MG-UNIT per tablet Take 1 tablet by mouth every morning.    . fish oil-omega-3 fatty acids 1000 MG capsule Take 1 g by mouth every morning.     . folic acid (FOLVITE) 1 MG tablet Take 2 mg by mouth every morning.     [name] FREESTYLE LITE test strip TEST ONCE EVERY DAY  4  . hydroxypropyl methylcellulose / hypromellose (ISOPTO TEARS / GONIOVISC) 2.5 % ophthalmic solution Place 1 drop into both eyes daily as needed for dry eyes.    . Lidocaine 2 % GEL Apply 1 g topically as needed. 2 Tube 11  . lovastatin (MEVACOR) 40 MG tablet 1 (ONE) TABLET BY MOUTH TAKE IN THE EVENING AFTER DINNER  3  . LYRICA 75 MG capsule TAKE ONE CAPSULE BY MOUTH 3 TIMES A DAY 90 capsule 5  . meloxicam (MOBIC) 15 MG tablet Take 15 mg by mouth daily.  11  . metoprolol succinate (TOPROL-XL) 50 MG 24 hr tablet TAKE 1 TABLET AT BEDTIME ONCE A DAY ORALLY 90 DAYS  2  . valsartan-hydrochlorothiazide (DIOVAN-HCT) 320-12.5 MG per tablet Take 1 tablet by mouth daily.    . vitamin E 400 UNIT capsule Take 400 Units by mouth daily.     No current facility-administered medications for this visit.     REVIEW OF SYSTEMS:  Constitutional: Denies fevers, chills or abnormal night sweats,  Eyes: Denies blurriness of vision, double vision or watery eyes Ears, nose, mouth, throat, and face: Denies mucositis or sore throat Respiratory: Denies cough, dyspnea or wheezes Cardiovascular: Denies palpitation, chest discomfort or lower extremity swelling Gastrointestinal:  Denies nausea, heartburn or change in bowel habits Skin: Denies abnormal skin rashes (+) nail pain  Musculoskeletal: (+) chronic back pain (+) generalized body pain Lymphatics: Denies new lymphadenopathy or easy bruising Neurological: (+) neuropathy with numbness to her fingers and LLs (+) gait disturbance (+) frequent falls Behavioral/Psych: Mood is stable, no new changes. Breast: (+) occasional shooting pains All other systems were reviewed with the patient and are negative. (+)  skin discoloration from radiation  PHYSICAL EXAMINATION: ECOG PERFORMANCE STATUS: 1 - Symptomatic but completely ambulatory  There were no vitals filed for this visit.  GENERAL:alert, no distress and comfortable. SKIN: skin color, texture, turgor are normal, no rashes or significant lesions. EYES: normal, conjunctiva are pink and non-injected, sclera clear OROPHARYNX:no exudate, no erythema and lips, buccal mucosa, and tongue normal  NECK: supple, thyroid normal size, non-tender, without nodularity LYMPH:  no palpable lymphadenopathy in the cervical, axillary or inguinal LUNGS: clear to auscultation and percussion with normal breathing effort HEART: regular rate & rhythm and no murmurs and no lower extremity edema ABDOMEN:abdomen soft, non-tender and normal bowel sounds MUSCULOSKELETAL:no cyanosis of digits and no clubbing. Ambulatory with a wheelchair and cane. PSYCH: alert & oriented x 3 with fluent speech NEURO: no focal motor/sensory deficits Breasts: (+) Breast inspection showed them to be symmetrical with no nipple discharge. Palpation of the breasts and axilla revealed no obvious mass that I could appreciate.(+) Scar 0.5 cm in size in UOQ next to nipple in incision of left breast, non-tender. incision in left axilla is well healed.    LABORATORY DATA:  I have reviewed the data as listed CBC Latest Ref Rng & Units 03/07/2018 08/25/2017 02/23/2017  WBC 4.0 - 10.5 K/uL 5.4 7.4 4.3  Hemoglobin 12.0 - 15.0 g/dL 11.3(L) 12.1 11.9  Hematocrit 36.0 - 46.0 % 36.4 38.6 36.6  Platelets 150 - 400  K/uL 240 217 184   CMP Latest Ref Rng & Units 03/07/2018 08/25/2017 02/23/2017  Glucose 70 - 99 mg/dL 113(H) 54(L) 115  BUN 8 - 23 mg/dL 17 26 23.5  Creatinine 0.44 - 1.00 mg/dL 0.97 1.22(H) 0.9  Sodium 135 - 145 mmol/L 144 145 144  Potassium 3.5 - 5.1 mmol/L 3.8 3.8 3.8  Chloride 98 - 111 mmol/L 105 109 -  CO2 22 - 32 mmol/L _0 Calcium 8.9 - 10.3 mg/dL 9.4 10.0 9.4  Total Protein 6.5 -  8.1 g/dL 7.3 7.5 6.8  Total Bilirubin 0.3 - 1.2 mg/dL 0.6 0.6 0.71  Alkaline Phos 38 - 126 U/L 88 97 84  AST 15 - 41 U/L _1 ALT 0 - 44 U/L _2 SPEP M-protein (g/dl) 02/10/2012: 0.74 12/20/2015: 0.9 03/06/2016: 0.4 07/08/2016: 0.8 11/04/16: 0.8 02/23/17:  0.6 08/25/17: 0.8 03/07/18: pending   IgA (mg/dl) 02/10/2012: 822 12/20/2015: 783 03/06/2016: 668 07/08/2016: 763 11/04/16: 853 02/23/17: 782 08/25/17: 984   Kappa, lambda light chain (mg/l) and ration  02/10/2012: 14.3, 16.3, 0.88 12/20/2015: 40.3, 12.8, 3.15 07/08/2016: 37.3, 12.7, 2.94 11/04/16: 3.55, 1.28, 2.77 02/23/17: 3.56, 1.27, 2.80 08/25/17: 4.61, 1.29, 3.57   PATHOLOGY REPORT  Diagnosis 11/14/2015 1. Breast, lumpectomy, Left - INVASIVE DUCTAL CARCINOMA, GRADE III/III, SPANNING 1.1 CM. - THE RESECTION MARGINS ARE NEGATIVE FOR CARCINOMA. - SEE ONCOLOGY TABLE BELOW. 2. Lymph node, sentinel, biopsy, Left Axillary - BENIGN BREAST PARENCHYMA. - LYMPH NODAL TISSUE IS NOT IDENTIFIED. - THERE IS NO EVIDENCE OF MALIGNANCY.  RADIOGRAPHIC STUDIES: I have personally reviewed the radiological images as listed and agreed with the findings in the report. No results found.   Diagnostic Mammogram 11/12/17 IMPRESSION: No mammographic evidence of malignancy in either breast, status post left lumpectomy. RECOMMENDATION: Diagnostic mammogram is suggested in 1 year.   Bone Density Scan 11/12/17 ASSESSMENT: The BMD measured at Forearm Radius 33% is 0.648 g/cm2 with a T-score of -2.7. This patient is considered osteoporotic according to [facility]) criteria. This scan is good quality. Lumbar spine was not utilized due to advanced degenerative changes. Site Region Measured Date Measured Age YA BMD Significant CHANGE T-score Left Forearm Radius 33% 11/12/2017 83.3 -2.9 0.648 g/cm2 DualFemur Total Right 11/12/2017 83.3 -2.3 0.719 g/cm2  Diagnostic Mammogram 11/11/16 IMPRESSION: 1. No mammographic  evidence of malignancy involving either breast. 2. Expected post lumpectomy and post radiation changes involving the left breast. 3. Post surgical changes in the right upper breast at far posterior depth corresponding to the previous Port-A-Cath site. RECOMMENDATION: Bilateral diagnostic mammography in 1 year.  ASSESSMENT & PLAN:   84 y.o. female, with past medical history of MGUS, hypertension, arthritis, with good performance status, was found to have stage I triple negative left breast cancer by screening mammogram.  1. Malignant or new present of upper outer quadrant of left female breast, invasive ductal carcinoma, grade 3, pT1cN0M0, stage IA, ER-/PR-/HER2- --I previously discussed her breast mammogram, ultrasound, biopsy and final surgical path result in details -She had sentinel lymph node biopsy, however it showed normal breast parenchyma, no lymphoid tissue, her pathological node status is unknown, no ultrasound evidence of adenopathy. -We previously reviewed the aggressive nature of triple negative breast cancer. Giving her stage I disease, I think she probably has 20-30% risk of distant recurrence in the next 5-10 years.  -She has completed adjuvant chemotherapy (weekly Taxol/abraxane) and radiation in 2017 -She has residual neuropathy. We previously discussed that neuropathy recover  slowly. -I have encouraged her to eat healthy and exercise as much as possible.  -she has chronic low back pain, which has been worse. I do not think this is concerning for metastatic disease  -She is clinically doing well. Lab reviewed, her CBC showed Hg 11.3 and CMP is overall within normal limits. Her physical exam and her 10/2017 mammogram were unremarkable. There is no clinical concern for recurrence. Her 10/2017 DEXA shows osteoporisis. -He has been 2.5 years since her initial diagnosis, her risk of recurrence has decreased. -Continue breast cancer surveillance.  -F/u in 6 months    2. MGUS    -lab reviewed with her, her M-protein and light chain levels have been overall stable  -No clinical concern for disease progression. will continue follow up SPEP/IFE, and light chain levels every 3-6 months. -No clinical concern for disease progression. We repeated her lab today, results still pending.   3. Chronic low back pain, lumbar spondylosis  -She has chronic low back pain from lumber spondylosis -She has had worsening low back pain lately, she had lumbar MRI in January 2018, which showed diffuse degenerative change, no evidence of bone metastasis -This is unlikely related to her breast cancer or MGUS -She does not want take pain medication. She wants to try acupuncture -I previously encouraged her to follow-up with her rheumatologist Dr. Dossie Der   4. Anemia  -she has chronic anemia, worse in 2016 due to her surgery. -improved, we'll continue monitoring.  5. Joint and muscular pain, Rheumatoid arthritis -She has constant low back and left hip pain, possible related to Taxol and RA  -she will continue follow up with rheumatologist Dr. Dossie Der.  -I previously encouraged her to take the calcium and magnesium over-the-counter, drink fluids adequately. -We'll continue monitoring. -The patient has stopped taking Tramadol. -I previously reviewed her recent lumbar MRI findings. We discussed that the patient has degenerative osteoarthritis, related to age and not chenotherapy or radiation treatment. -I previously advised the patient to continue exercise to help with her ambulation and strength. She may follow up with her orthopedic surgeon to monitor this. -I advised her to use magnesium for her body pain. She agrees.   6. Peripheral neuropathy, G2 -No significant impact on her function, we'll continue to monitor closely. She understands that recovery from neuropathy is a slow process. -She has mild gait instability due to neuropathy and fatigue, I previously recommended her to get physical  therapy; she also try to exercise  -I previously advised the patient to continue B12.  -She is also taking Lyrica which was prescribed by neurologist  -overall stable. She has also tried acupuncture which helped.  7. HTN -Her BP has been elevated at 170/77 on 02/23/17. This can be related to her continued back pain.  -I encouraged her to Follow with Dr. Maudie Mercury on this.   8. Short Term Memory loss -Pt shares concern for memory loss but she denies further medication to help. She previously declined clinical trial option. I suggest she does mental games and read and socialize to help.    9. Osteoporosis  -10/2017 DEXA shows osteoporosis with lowest T-score of -2.7 at Forearm radius.  -I discussed her high fracture risk due to her frequent falls. I advised her to continue taking calcium and vitamin D.  -I discussed oral and injectable bisphosphonates. She agrees and prefers to injections. I will prescribe Zometa. I educated her about the importance of maintaining good dental hygine and health. She states that she cannot afford a  dentist, but will see what she can do    Plan -I will check if her insurance will approve Zometa. She will call after she discusses with her PCP and see a dentist if she decides to proceed. -f/u in 6 months -Continue breast cancer surveillance.   All questions were answered. The patient knows to call the clinic with any problems, questions or concerns.  I spent 20 minutes counseling the patient face to face. The total time spent in the appointment was 25 minutes and more than 50% was on counseling.  Dierdre Searles Dweik am acting as scribe for Dr. Truitt Merle.  I have reviewed the above documentation for accuracy and completeness, and I agree with the above.      Truitt Merle, MD 03/07/2018

## 2018-03-08 ENCOUNTER — Encounter: Payer: Self-pay | Admitting: Hematology

## 2018-03-08 LAB — KAPPA/LAMBDA LIGHT CHAINS
Kappa free light chain: 47.6 mg/L — ABNORMAL HIGH (ref 3.3–19.4)
Kappa, lambda light chain ratio: 3.26 — ABNORMAL HIGH (ref 0.26–1.65)
Lambda free light chains: 14.6 mg/L (ref 5.7–26.3)

## 2018-03-09 LAB — MULTIPLE MYELOMA PANEL, SERUM
ALBUMIN SERPL ELPH-MCNC: 3.4 g/dL (ref 2.9–4.4)
Albumin/Glob SerPl: 1.1 (ref 0.7–1.7)
Alpha 1: 0.2 g/dL (ref 0.0–0.4)
Alpha2 Glob SerPl Elph-Mcnc: 0.7 g/dL (ref 0.4–1.0)
B-Globulin SerPl Elph-Mcnc: 1.7 g/dL — ABNORMAL HIGH (ref 0.7–1.3)
GAMMA GLOB SERPL ELPH-MCNC: 0.7 g/dL (ref 0.4–1.8)
GLOBULIN, TOTAL: 3.3 g/dL (ref 2.2–3.9)
IGA: 969 mg/dL — AB (ref 64–422)
IgG (Immunoglobin G), Serum: 859 mg/dL (ref 700–1600)
IgM (Immunoglobulin M), Srm: 66 mg/dL (ref 26–217)
M Protein SerPl Elph-Mcnc: 0.8 g/dL — ABNORMAL HIGH
Total Protein ELP: 6.7 g/dL (ref 6.0–8.5)

## 2018-03-13 ENCOUNTER — Encounter (HOSPITAL_COMMUNITY): Payer: Self-pay | Admitting: Emergency Medicine

## 2018-03-13 ENCOUNTER — Other Ambulatory Visit: Payer: Self-pay

## 2018-03-13 ENCOUNTER — Emergency Department (HOSPITAL_COMMUNITY): Payer: Medicare Other

## 2018-03-13 ENCOUNTER — Emergency Department (HOSPITAL_COMMUNITY)
Admission: EM | Admit: 2018-03-13 | Discharge: 2018-03-13 | Disposition: A | Discharge status: Home / Self Care | Payer: Medicare Other | Attending: Emergency Medicine | Admitting: Emergency Medicine

## 2018-03-13 DIAGNOSIS — T426X5A Adverse effect of other antiepileptic and sedative-hypnotic drugs, initial encounter: Secondary | ICD-10-CM | POA: Diagnosis not present

## 2018-03-13 DIAGNOSIS — R42 Dizziness and giddiness: Secondary | ICD-10-CM | POA: Diagnosis not present

## 2018-03-13 DIAGNOSIS — E119 Type 2 diabetes mellitus without complications: Secondary | ICD-10-CM | POA: Insufficient documentation

## 2018-03-13 DIAGNOSIS — Z96651 Presence of right artificial knee joint: Secondary | ICD-10-CM | POA: Insufficient documentation

## 2018-03-13 DIAGNOSIS — T50905A Adverse effect of unspecified drugs, medicaments and biological substances, initial encounter: Secondary | ICD-10-CM | POA: Insufficient documentation

## 2018-03-13 DIAGNOSIS — Z79899 Other long term (current) drug therapy: Secondary | ICD-10-CM | POA: Diagnosis not present

## 2018-03-13 DIAGNOSIS — I10 Essential (primary) hypertension: Secondary | ICD-10-CM | POA: Insufficient documentation

## 2018-03-13 DIAGNOSIS — Z853 Personal history of malignant neoplasm of breast: Secondary | ICD-10-CM | POA: Insufficient documentation

## 2018-03-13 DIAGNOSIS — Z85028 Personal history of other malignant neoplasm of stomach: Secondary | ICD-10-CM | POA: Diagnosis not present

## 2018-03-13 DIAGNOSIS — W19XXXA Unspecified fall, initial encounter: Secondary | ICD-10-CM | POA: Diagnosis not present

## 2018-03-13 DIAGNOSIS — R001 Bradycardia, unspecified: Secondary | ICD-10-CM | POA: Diagnosis not present

## 2018-03-13 LAB — CBC WITH DIFFERENTIAL/PLATELET
Abs Immature Granulocytes: 0.03 10*3/uL (ref 0.00–0.07)
BASOS ABS: 0.1 10*3/uL (ref 0.0–0.1)
Basophils Relative: 1 %
EOS PCT: 3 %
Eosinophils Absolute: 0.2 10*3/uL (ref 0.0–0.5)
HCT: 37 % (ref 36.0–46.0)
Hemoglobin: 11.4 g/dL — ABNORMAL LOW (ref 12.0–15.0)
Immature Granulocytes: 0 %
Lymphocytes Relative: 14 %
Lymphs Abs: 1.1 10*3/uL (ref 0.7–4.0)
MCH: 26.7 pg (ref 26.0–34.0)
MCHC: 30.8 g/dL (ref 30.0–36.0)
MCV: 86.7 fL (ref 80.0–100.0)
Monocytes Absolute: 0.6 10*3/uL (ref 0.1–1.0)
Monocytes Relative: 8 %
NRBC: 0 % (ref 0.0–0.2)
Neutro Abs: 5.5 10*3/uL (ref 1.7–7.7)
Neutrophils Relative %: 74 %
Platelets: 261 10*3/uL (ref 150–400)
RBC: 4.27 MIL/uL (ref 3.87–5.11)
RDW: 14.6 % (ref 11.5–15.5)
WBC: 7.5 10*3/uL (ref 4.0–10.5)

## 2018-03-13 LAB — TROPONIN I: Troponin I: <0.03 ng/mL (ref <0.03)

## 2018-03-13 LAB — BASIC METABOLIC PANEL
Anion gap: 7 (ref 5–15)
BUN: 29 mg/dL — AB (ref 8–23)
CALCIUM: 9.2 mg/dL (ref 8.9–10.3)
CO2: 29 mmol/L (ref 22–32)
Chloride: 107 mmol/L (ref 98–111)
Creatinine, Ser: 1.01 mg/dL — ABNORMAL HIGH (ref 0.44–1.00)
GFR calc Af Amer: 58 mL/min — ABNORMAL LOW (ref >60)
GFR, EST NON AFRICAN AMERICAN: 50 mL/min — AB (ref >60)
GLUCOSE: 110 mg/dL — AB (ref 70–99)
Potassium: 3.6 mmol/L (ref 3.5–5.1)
Sodium: 143 mmol/L (ref 135–145)

## 2018-03-13 MED ORDER — PREGABALIN 25 MG PO CAPS: 25.0000 mg | ORAL_CAPSULE | Freq: Three times a day (TID) | ORAL | 0 refills | Status: DC

## 2018-03-13 MED ORDER — SODIUM CHLORIDE 0.9 % IV BOLUS
1000.0000 mL | Freq: Once | INTRAVENOUS | Status: AC
  Administered 2018-03-13: 1000 mL via INTRAVENOUS

## 2018-03-13 NOTE — ED Notes (Signed)
Pt ambulated in hall with one person assist

## 2018-03-13 NOTE — ED Provider Notes (Signed)
[facility] DEPT Provider Note   CSN: [id number] Arrival date & time: 03/13/18  0759     History   Chief Complaint Chief Complaint  Patient presents with  . Dizziness    HPI Julie [name] is a 84 y.o. female.  HPI 84 year old female with past medical history as below here with dizziness.  Patient states that over the last 4 days, she has had progressively worsening sensation of dizziness and drowsiness.  The patient states that she feels like she is off balance and has been running into walls.  She has been leaning both directions.  She feels like the room is spinning.  She also feels a "out of body" feeling and feels like she is disconnected.  Of note, this began after she just recently reinitiated her pregabalin.  She also reports generalized weakness.  No focal weakness.  No vision changes.  No dysarthria or dysphagia.  No choking.  Denies any headache.  No fevers or chills.  No other recent medication changes.  Symptoms seem to be fairly constant, though slightly worse when she goes from sitting to standing.  She is been able to eat and drink without difficulty.  Past Medical History:  Diagnosis Date  . Anemia   . Anxiety    takes Klonopin at bedtime  . Arthritis    takes Methotrexate weekly and Prednisone  . Blood transfusion    no abnormal reaction noted  . Cancer (HCC)    breast   . Cataracts, bilateral    immature  . Complication of anesthesia    wakes up during surgery  . Depression   . Diabetes mellitus without complication ([place])   . Dyspnea    with exertion since chemo   . Fibromyalgia    takes Lyrica daily  . GERD (gastroesophageal reflux disease)    takes Nexium daily  . Glaucoma   . Hard of hearing   . Heart murmur   . Histoplasmosis   . History of colon polyps   . History of gout   . History of hiatal hernia   . History of shingles   . Hyperlipidemia   . Hypertension    takes Amlodipine,Metoprolol,and Losartan daily   . Incontinence of bowel   . Interstitial cystitis   . Joint pain   . Low back pain   . Nocturia   . Peripheral neuropathy   . Peripheral neuropathy   . Personal history of chemotherapy   . Personal history of radiation therapy   . PONV (postoperative nausea and vomiting)   . Ulcer 1970   duodenal  . Urinary frequency   . Urinary urgency   . Weakness    numbness in both hands and feet    Patient Active Problem List   Diagnosis Date Noted  . Peripheral neuropathy 06/08/2016  . Low back pain without sciatica 06/08/2016  . Abnormality of gait 06/08/2016  . Anemia due to antineoplastic chemotherapy 03/06/2016  . Hypersensitivity reaction 01/07/2016  . Port catheter in place 01/03/2016  . Malignant neoplasm of upper outer quadrant of female breast ([place]) 10/30/2015  . Primary osteoarthritis of right knee 02/28/2015  . Post-traumatic arthritis of ankle 05/10/2014  . MGUS (monoclonal gammopathy of unknown significance) 02/17/2012  . Diverticulosis of colon (without mention of hemorrhage) 01/21/2011  . Family history of malignant neoplasm of gastrointestinal tract 01/21/2011  . Special screening for malignant neoplasms, colon 01/21/2011  . Other symptoms involving digestive system(787.99) 01/21/2011    Past Surgical  History:  Procedure Laterality Date  . ABDOMINAL HYSTERECTOMY    . accupuncture  3 yrs ago  . ANKLE FUSION Left 05/10/2014   Procedure: LEFT ANKLE ARTHRODESIS ;  Surgeon: Wylene Simmer, MD;  Location: [facility];  Service: Orthopedics;  Laterality: Left;  . ANKLE SURGERY Left   . arm surgery Left    radius  . bladder tacked     . BREAST LUMPECTOMY Left   . CERVICAL FUSION    . CHOLECYSTECTOMY    . COLONOSCOPY    . CYSTOCELE REPAIR    . DILATION AND CURETTAGE OF UTERUS    . ESOPHAGOGASTRODUODENOSCOPY    . PORT A CATH REVISION N/A 12/27/2015   Procedure: PORT A CATH REVISION;  Surgeon: Rolm Bookbinder, MD;  Location: [facility];  Service: General;   Laterality: N/A;  . PORT-A-CATH REMOVAL N/A 03/25/2016   Procedure: REMOVAL PORT-A-CATH;  Surgeon: Rolm Bookbinder, MD;  Location: [facility];  Service: General;  Laterality: N/A;  . PORTACATH PLACEMENT Right 12/27/2015   Procedure: INSERTION PORT-A-CATH WITH ULTRASOUND ;  Surgeon: Rolm Bookbinder, MD;  Location: [facility];  Service: General;  Laterality: Right;  . RADIOACTIVE SEED GUIDED PARTIAL MASTECTOMY WITH AXILLARY SENTINEL LYMPH NODE BIOPSY Left 11/14/2015   Procedure: RADIOACTIVE SEED GUIDED LEFT BREAST LUMPECTOMY WITH AXILLARY SENTINEL LYMPH NODE BIOPSY;  Surgeon: Rolm Bookbinder, MD;  Location: [facility];  Service: General;  Laterality: Left;  RADIOACTIVE SEED GUIDED LEFT BREAST LUMPECTOMY WITH AXILLARY SENTINEL LYMPH NODE BIOPSY  . SALPINGOOPHORECTOMY Bilateral   . TOTAL KNEE ARTHROPLASTY Right 02/28/2015   Procedure: RIGHT TOTAL KNEE ARTHROPLASTY;  Surgeon: Rod Can, MD;  Location: [facility];  Service: Orthopedics;  Laterality: Right;  . UPPER GASTROINTESTINAL ENDOSCOPY    . VAGINAL HYSTERECTOMY       OB History   None      Home Medications    Prior to Admission medications   Medication Sig Start Date End Date Taking? Authorizing Provider  amLODipine (NORVASC) 10 MG tablet Take 10 mg by mouth every morning.     [provider]  Biotin 10000 MCG TABS Take 1 tablet by mouth daily.    [provider]  calcium-vitamin D (OSCAL WITH D) 500-200 MG-UNIT per tablet Take 1 tablet by mouth every morning.    [provider]  fish oil-omega-3 fatty acids 1000 MG capsule Take 1 g by mouth every morning.     [provider]  folic acid (FOLVITE) 1 MG tablet Take 2 mg by mouth every morning.     [provider]  FREESTYLE LITE test strip TEST ONCE EVERY DAY 01/27/17   [provider]  hydroxypropyl methylcellulose / hypromellose (ISOPTO TEARS / GONIOVISC) 2.5 % ophthalmic solution Place 1 drop into both  eyes daily as needed for dry eyes.    [provider]  Lidocaine 2 % GEL Apply 1 g topically as needed. 07/23/16   Marcial Pacas, MD  lovastatin (MEVACOR) 40 MG tablet 1 (ONE) TABLET BY MOUTH TAKE IN THE EVENING AFTER DINNER 02/17/17   [provider]  meloxicam (MOBIC) 15 MG tablet Take 15 mg by mouth daily. 11/09/15   [provider]  metoprolol succinate (TOPROL-XL) 50 MG 24 hr tablet TAKE 1 TABLET AT BEDTIME ONCE A DAY ORALLY 90 DAYS 08/29/15   [provider]  pregabalin (LYRICA) 25 MG capsule Take 1 capsule (25 mg total) by mouth 3 (three) times daily. 03/13/18 04/12/18  Duffy Bruce, MD  valsartan-hydrochlorothiazide (DIOVAN-HCT) 320-12.5 MG per tablet Take 1 tablet by mouth daily.    [provider]  vitamin E 400 UNIT capsule Take 400 Units by mouth daily.    [provider]    Family History Family History  Problem Relation Age of Onset  . Colon cancer Sister 1  . Esophageal cancer Brother 90  . Breast cancer Mother   . Brain cancer Other     Social History Social History   Tobacco Use  . Smoking status: Never Smoker  . Smokeless tobacco: Never Used  Substance Use Topics  . Alcohol use: No    Comment: IN [facility] ONLY  . Drug use: No     Allergies   Codeine; Other; and Aspirin   Review of Systems Review of Systems  Constitutional: Positive for fatigue. Negative for chills and fever.  HENT: Negative for congestion and rhinorrhea.   Eyes: Negative for visual disturbance.  Respiratory: Negative for cough, shortness of breath and wheezing.   Cardiovascular: Negative for chest pain and leg swelling.  Gastrointestinal: Negative for abdominal pain, diarrhea, nausea and vomiting.  Genitourinary: Negative for dysuria and flank pain.  Musculoskeletal: Negative for neck pain and neck stiffness.  Skin: Negative for rash and wound.  Allergic/Immunologic: Negative for immunocompromised state.  Neurological: Positive for  dizziness and weakness. Negative for syncope and headaches.  All other systems reviewed and are negative.    Physical Exam Updated Vital Signs BP 123/62 (BP Location: Right Arm)   Pulse (!) 59   Temp 98 F (36.7 C) (Oral)   Resp 18   SpO2 100%   Physical Exam  Constitutional: She is oriented to person, place, and time. She appears well-developed and well-nourished. No distress.  HENT:  Head: Normocephalic and atraumatic.  Eyes: Conjunctivae are normal.  Neck: Neck supple.  Cardiovascular: Normal rate, regular rhythm and normal heart sounds. Exam reveals no friction rub.  No murmur heard. Pulmonary/Chest: Effort normal and breath sounds normal. No respiratory distress. She has no wheezes. She has no rales.  Abdominal: She exhibits no distension.  Musculoskeletal: She exhibits no edema.  Neurological: She is alert and oriented to person, place, and time. She exhibits normal muscle tone.  Skin: Skin is warm. Capillary refill takes less than 2 seconds.  Psychiatric: She has a normal mood and affect.  Nursing note and vitals reviewed.   Neurological Exam:  Mental Status: Alert and oriented to person, place, and time. Attention and concentration normal. Speech clear. Recent memory is intact. Cranial Nerves: Visual fields grossly intact. EOMI and PERRLA. No nystagmus noted. Facial sensation intact at forehead, maxillary cheek, and chin/mandible bilaterally. No facial asymmetry or weakness. Hearing grossly normal. Uvula is midline, and palate elevates symmetrically. Normal SCM and trapezius strength. Tongue midline without fasciculations. Motor: Muscle strength 5/5 in proximal and distal UE and LE bilaterally. No pronator drift. Muscle tone normal. Reflexes: 2+ and symmetrical in all four extremities.  Sensation: Intact to light touch in upper and lower extremities distally bilaterally.  Gait: Normal without ataxia. Coordination: Normal FTN bilaterally.    ED Treatments / Results    Labs (all labs ordered are listed, but only abnormal results are displayed) Labs Reviewed  CBC WITH DIFFERENTIAL/PLATELET - Abnormal; Notable for the following components:      Result Value   Hemoglobin 11.4 (*)    All other components within normal limits  BASIC METABOLIC PANEL - Abnormal; Notable for the following components:   Glucose, Bld 110 (*)  BUN 29 (*)    Creatinine, Ser 1.01 (*)    GFR calc non Af Amer 50 (*)    GFR calc Af Amer 58 (*)    All other components within normal limits  TROPONIN I    EKG EKG Interpretation  Date/Time:  Sunday March 13 2018 09:01:33 EDT Ventricular Rate:  57 PR Interval:    QRS Duration: 97 QT Interval:  458 QTC Calculation: 446 R Axis:   -6 Text Interpretation:  Sinus rhythm Abnormal R-wave progression, early transition Left ventricular hypertrophy No significant change since last tracing Confirmed by Kelie Gainey ([id number]) on 03/13/2018 9:06:15 AM   Radiology Mr Brain Wo Contrast  Result Date: 03/13/2018 CLINICAL DATA:  Intermittent dizziness for 3 days with some progression today. EXAM: MRI HEAD WITHOUT CONTRAST TECHNIQUE: Multiplanar, multiecho pulse sequences of the brain and surrounding structures were obtained without intravenous contrast. COMPARISON:  MRI brain 06/12/2012 FINDINGS: Brain: Diffusion-weighted images demonstrate no acute or subacute infarction. Advanced atrophy and white matter disease is again seen. No acute infarct, hemorrhage, or mass lesion is present. Dilated perivascular spaces are present throughout the basal ganglia. Ventricles are proportionate to the degree of atrophy. Remote ischemic changes in the thalami bilaterally are stable. White matter changes extend into the brainstem. The internal auditory canals are within normal limits. Vascular: Flow is present in the major intracranial arteries. Skull and upper cervical spine: The skull base is within normal limits. Degenerative changes are present at C2-3 and  C3-4. Anterior fusion is noted at C4-5. Marrow signal is normal. Sinuses/Orbits: The paranasal sinuses and mastoid air cells are clear. Bilateral lens replacements are present. Globes and orbits are otherwise within normal limits. IMPRESSION: 1. No acute intracranial abnormality. 2. Similar appearance of advanced bilateral white matter disease, likely reflecting the sequela of chronic microvascular ischemia. Electronically Signed   By: Christopher  Mattern M.D.   On: 03/13/2018 11:59    Procedures Procedures (including critical care time)  Medications Ordered in ED Medications  sodium chloride 0.9 % bolus 1,000 mL (0 mLs Intravenous Stopped 03/13/18 1223)     Initial Impression / Assessment and Plan / ED Course  I have reviewed the triage vital signs and the nursing notes.  Pertinent labs & imaging results that were available during my care of the patient were reviewed by me and considered in my medical decision making (see chart for details).     85  yo F here with dizziness, generalized fatigue. Lab work is very reassuring. On exam, she has no focal neuro deficits. I suspect this is actually 2/2 increase in her pregabalin/lyrica. She was supposed to begin with one capsule nightly of 75 mg but has instead been taking 150 mg at night, and 75 in the AM. MRI is negative, with no signs of acute CVA. She's ambulatory here. Had a long discussion w/ family and pt - will prescribe a lower dose, have her hold tonight, and restart at much lower dose tomorrow.  Final Clinical Impressions(s) / ED Diagnoses   Final diagnoses:  Adverse effect of drug, initial encounter  Dizziness    ED Discharge Orders         Ordered    pregabalin (LYRICA) 25 MG capsule  3 times daily    Note to Pharmacy:  Generic   03/13/18 1242           Duffy Bruce, MD 03/13/18 [phone]

## 2018-03-13 NOTE — ED Triage Notes (Signed)
Pt BIB [facility] from home. Pt c/o intermitent dizziness since Thursday that became worse this am after taking a hot shower. Lt arm restricted. Hx of breast cancer.

## 2018-03-13 NOTE — Discharge Instructions (Addendum)
For now: STOP taking the high dose of pregabalin.  I have prescribed a new, much lower dose of pregabalin 25 mg.  I would NOT TAKE any pregabalin today.  Starting tomorrow, I would recommend taking ONE capsule at night. You can increase this to TWO capsules if you do not have any side effects after 2-3 days. You can further increase from here, based on your response, up to a maximum dose of 75 mg three times a day. You should call your doctor to discuss further increases.

## 2018-03-13 NOTE — ED Notes (Signed)
Bed: WA04 Expected date: 03/13/18 Expected time: 8:00 AM Means of arrival: Ambulance Comments: 84 yo Light headed after getting out of shower

## 2018-03-14 ENCOUNTER — Telehealth: Payer: Self-pay

## 2018-03-14 DIAGNOSIS — M797 Fibromyalgia: Secondary | ICD-10-CM | POA: Diagnosis not present

## 2018-03-14 NOTE — Telephone Encounter (Signed)
-----   Message from Truitt Merle, MD sent at 03/14/2018  7:50 AM EDT ----- Please let pt know her M-protein is stable, no concerns, thanks   Truitt Merle  03/14/2018

## 2018-03-14 NOTE — Telephone Encounter (Signed)
Per Dr. Burr Medico left voice message regarding lab results, M-protein is stable, no concerns.

## 2018-03-22 DIAGNOSIS — M5136 Other intervertebral disc degeneration, lumbar region: Secondary | ICD-10-CM | POA: Diagnosis not present

## 2018-03-22 DIAGNOSIS — R42 Dizziness and giddiness: Secondary | ICD-10-CM | POA: Diagnosis not present

## 2018-04-12 DIAGNOSIS — M797 Fibromyalgia: Secondary | ICD-10-CM | POA: Diagnosis not present

## 2018-04-12 DIAGNOSIS — E119 Type 2 diabetes mellitus without complications: Secondary | ICD-10-CM | POA: Diagnosis not present

## 2018-04-12 DIAGNOSIS — E538 Deficiency of other specified B group vitamins: Secondary | ICD-10-CM | POA: Diagnosis not present

## 2018-04-12 DIAGNOSIS — I10 Essential (primary) hypertension: Secondary | ICD-10-CM | POA: Diagnosis not present

## 2018-04-19 DIAGNOSIS — E119 Type 2 diabetes mellitus without complications: Secondary | ICD-10-CM | POA: Diagnosis not present

## 2018-04-19 DIAGNOSIS — Z0000 Encounter for general adult medical examination without abnormal findings: Secondary | ICD-10-CM | POA: Diagnosis not present

## 2018-04-19 DIAGNOSIS — E785 Hyperlipidemia, unspecified: Secondary | ICD-10-CM | POA: Diagnosis not present

## 2018-04-19 DIAGNOSIS — I119 Hypertensive heart disease without heart failure: Secondary | ICD-10-CM | POA: Diagnosis not present

## 2018-04-19 DIAGNOSIS — M5136 Other intervertebral disc degeneration, lumbar region: Secondary | ICD-10-CM | POA: Diagnosis not present

## 2018-05-02 ENCOUNTER — Emergency Department (HOSPITAL_COMMUNITY)
Admission: EM | Admit: 2018-05-02 | Discharge: 2018-05-03 | Disposition: A | Discharge status: Home / Self Care | Payer: Medicare Other | Attending: Emergency Medicine | Admitting: Emergency Medicine

## 2018-05-02 DIAGNOSIS — Z5321 Procedure and treatment not carried out due to patient leaving prior to being seen by health care provider: Secondary | ICD-10-CM | POA: Diagnosis not present

## 2018-05-02 DIAGNOSIS — K6289 Other specified diseases of anus and rectum: Secondary | ICD-10-CM | POA: Diagnosis not present

## 2018-05-03 ENCOUNTER — Other Ambulatory Visit: Payer: Self-pay

## 2018-05-03 ENCOUNTER — Encounter (HOSPITAL_COMMUNITY): Payer: Self-pay | Admitting: Emergency Medicine

## 2018-05-03 NOTE — ED Triage Notes (Signed)
Pt reports rectal pain that started around 9:30pm when urinating and used sitz bath. Pt reported using suppository without relief.

## 2018-05-04 DIAGNOSIS — K649 Unspecified hemorrhoids: Secondary | ICD-10-CM | POA: Diagnosis not present

## 2018-05-04 DIAGNOSIS — K5900 Constipation, unspecified: Secondary | ICD-10-CM | POA: Diagnosis not present

## 2018-05-24 ENCOUNTER — Telehealth: Payer: Self-pay

## 2018-05-24 NOTE — Telephone Encounter (Signed)
Patient calls stating she feels rope like area in left breast, non painful, wants appointment to see Dr. Burr Medico only, offered her to see Sandi Mealy in [facility], patient refused, patient was given next available appointment with Dr. Burr Medico on 06/03/18 at 9:15.

## 2018-05-27 DIAGNOSIS — M549 Dorsalgia, unspecified: Secondary | ICD-10-CM | POA: Diagnosis not present

## 2018-05-27 DIAGNOSIS — M0579 Rheumatoid arthritis with rheumatoid factor of multiple sites without organ or systems involvement: Secondary | ICD-10-CM | POA: Diagnosis not present

## 2018-05-27 DIAGNOSIS — M130 Polyarthritis, unspecified: Secondary | ICD-10-CM | POA: Diagnosis not present

## 2018-05-27 DIAGNOSIS — M5137 Other intervertebral disc degeneration, lumbosacral region: Secondary | ICD-10-CM | POA: Diagnosis not present

## 2018-05-27 DIAGNOSIS — Z79899 Other long term (current) drug therapy: Secondary | ICD-10-CM | POA: Diagnosis not present

## 2018-05-27 DIAGNOSIS — M5030 Other cervical disc degeneration, unspecified cervical region: Secondary | ICD-10-CM | POA: Diagnosis not present

## 2018-05-27 DIAGNOSIS — M6281 Muscle weakness (generalized): Secondary | ICD-10-CM | POA: Diagnosis not present

## 2018-06-01 NOTE — Progress Notes (Signed)
[place]   [phone] [phone]) [phone]   Clinic Follow up Note   Patient Care Team: [name], DO as PCP - General (Family Medicine) Valinda Party, MD as Referring Physician (Rheumatology) Rolm Bookbinder, MD as Consulting Physician (General Surgery) Truitt Merle, MD as Consulting Physician (Hematology) Delice Bison Charlestine Massed, NP as Nurse Practitioner (Hematology and Oncology)  Date of Service:  06/03/2018  CHIEF COMPLAINT:  Follow up left breast cancer   SUMMARY OF ONCOLOGIC HISTORY: Oncology History   Malignant neoplasm of upper outer quadrant of female breast [facility])   Staging form: Breast, AJCC 7th Edition     Pathologic stage from 11/14/2015: Stage IA (T1c, N0, cM0) - Signed by Truitt Merle, MD on 11/25/2015     Clinical stage from 11/20/2015: Stage IA (T1c, N0, M0) - Signed by Curt Bears, MD on 11/20/2015       Malignant neoplasm of upper outer quadrant of female breast ([place])   10/09/2015 Mammogram    Screening bilateral mammograms showed a possible left breast mass. Diagnostic mammogram and ultrasound showed a 7 x 7 x 7 mm mass in the left breast at 11:00 position.    10/22/2015 Receptors her2    ER negative, PR negative, HER-2 not amplified    10/22/2015 Initial Biopsy    Left breast needle core biopsy at the 1:00 position showed invasive ductal carcinoma.    10/22/2015 Initial Diagnosis    Malignant neoplasm of upper outer quadrant of female breast ([place])    11/14/2015 Surgery    Left breast lumpectomy and sentinel lymph node biopsy [name])    11/14/2015 Pathology Results    Left breast lumpectomy showed invasive ductal carcinoma, grade 3, 1.1 cm, resection margins were negative, sentinel lymph node biopsy showed benign breast parenchyma, lymph node tissue not identified. Repeated ER PR and HER-2 were all negative.    12/20/2015 - 03/13/2016 Chemotherapy    Weekly Taxol 80 mg/m x 2 cycles, then changed to Abraxane 100 mg/m for  cycles 3-12 due to infusion reaction. Abraxane dose-reduced cycle #9, then again for cycles #11 & #12 due to neuropathy. [total 12 cycles chemo]     04/01/2016 - 05/11/2016 Radiation Therapy    Adjuvant breast radiation ([place]). Left breast: 50.4 Gy in 28 fractions    06/19/2016 Imaging    MR Lumbar Spine without contrast  IMPRESSION: 1. Diffuse lumbar spondylosis with discogenic and facet degenerative changes progressed from 2010 lumbar MRI. 2. Multilevel canal stenosis is mild-to-moderate at L2-3, moderate L3-4, and mild at L4-5. 3. Multiple levels of mild and moderate neural foraminal narrowing with severe foraminal narrowing at the right L3-4 and L4-5 levels.    11/12/2017 Imaging    11/12/2017 DEXA ASSESSMENT: The BMD measured at Forearm Radius 33% is 0.648 g/cm2 with a T-score of -2.7. This patient is considered osteoporotic according to [facility]) criteria.    11/12/2017 Mammogram    11/12/2017 Diagnostic Mammogram IMPRESSION: No mammographic evidence of malignancy in either breast, status post left lumpectomy.      CURRENT THERAPY:  Surveillance   INTERVAL HISTORY:  Julie [name] is here for a follow up of left breast cancer.  She came in earlier than she initially scheduled, due to her recent left breast pain.  She presents to the clinic today with her daughter. She notes she is being treated for her RA with Prednisone. She notes since starting her body pain has much improved. She notes left breast shooting pain which is  occasional, she also noticed some tenderness of the breast.  REVIEW OF SYSTEMS:   Constitutional: Denies fevers, chills or abnormal weight loss Eyes: Denies blurriness of vision Ears, nose, mouth, throat, and face: Denies mucositis or sore throat Respiratory: Denies cough, dyspnea or wheezes Cardiovascular: Denies palpitation, chest discomfort or lower extremity swelling Gastrointestinal:  Denies nausea, heartburn or change in  bowel habits Skin: Denies abnormal skin rashes MSK: (+) RA with improved body pain  Lymphatics: Denies new lymphadenopathy or easy bruising Neurological:Denies numbness, tingling or new weaknesses Behavioral/Psych: Mood is stable, no new changes  BREAST: (+) shooting pain in left breast and scar tissue in left breast  All other systems were reviewed with the patient and are negative.  MEDICAL HISTORY:  Past Medical History:  Diagnosis Date  . Anemia   . Anxiety    takes Klonopin at bedtime  . Arthritis    takes Methotrexate weekly and Prednisone  . Blood transfusion    no abnormal reaction noted  . Cancer (HCC)    breast   . Cataracts, bilateral    immature  . Complication of anesthesia    wakes up during surgery  . Depression   . Diabetes mellitus without complication ([place])   . Dyspnea    with exertion since chemo   . Fibromyalgia    takes Lyrica daily  . GERD (gastroesophageal reflux disease)    takes Nexium daily  . Glaucoma   . Hard of hearing   . Heart murmur   . Histoplasmosis   . History of colon polyps   . History of gout   . History of hiatal hernia   . History of shingles   . Hyperlipidemia   . Hypertension    takes Amlodipine,Metoprolol,and Losartan daily  . Incontinence of bowel   . Interstitial cystitis   . Joint pain   . Low back pain   . Nocturia   . Peripheral neuropathy   . Peripheral neuropathy   . Personal history of chemotherapy   . Personal history of radiation therapy   . PONV (postoperative nausea and vomiting)   . Ulcer 1970   duodenal  . Urinary frequency   . Urinary urgency   . Weakness    numbness in both hands and feet    SURGICAL HISTORY: Past Surgical History:  Procedure Laterality Date  . ABDOMINAL HYSTERECTOMY    . accupuncture  3 yrs ago  . ANKLE FUSION Left 05/10/2014   Procedure: LEFT ANKLE ARTHRODESIS ;  Surgeon: Wylene Simmer, MD;  Location: [facility];  Service: Orthopedics;  Laterality: Left;  . ANKLE SURGERY Left     . arm surgery Left    radius  . bladder tacked     . BREAST LUMPECTOMY Left   . CERVICAL FUSION    . CHOLECYSTECTOMY    . COLONOSCOPY    . CYSTOCELE REPAIR    . DILATION AND CURETTAGE OF UTERUS    . ESOPHAGOGASTRODUODENOSCOPY    . PORT A CATH REVISION N/A 12/27/2015   Procedure: PORT A CATH REVISION;  Surgeon: Rolm Bookbinder, MD;  Location: [facility];  Service: General;  Laterality: N/A;  . PORT-A-CATH REMOVAL N/A 03/25/2016   Procedure: REMOVAL PORT-A-CATH;  Surgeon: Rolm Bookbinder, MD;  Location: [facility];  Service: General;  Laterality: N/A;  . PORTACATH PLACEMENT Right 12/27/2015   Procedure: INSERTION PORT-A-CATH WITH ULTRASOUND ;  Surgeon: Rolm Bookbinder, MD;  Location: [place];  Service: General;  Laterality: Right;  .  RADIOACTIVE SEED GUIDED PARTIAL MASTECTOMY WITH AXILLARY SENTINEL LYMPH NODE BIOPSY Left 11/14/2015   Procedure: RADIOACTIVE SEED GUIDED LEFT BREAST LUMPECTOMY WITH AXILLARY SENTINEL LYMPH NODE BIOPSY;  Surgeon: Rolm Bookbinder, MD;  Location: [place];  Service: General;  Laterality: Left;  RADIOACTIVE SEED GUIDED LEFT BREAST LUMPECTOMY WITH AXILLARY SENTINEL LYMPH NODE BIOPSY  . SALPINGOOPHORECTOMY Bilateral   . TOTAL KNEE ARTHROPLASTY Right 02/28/2015   Procedure: RIGHT TOTAL KNEE ARTHROPLASTY;  Surgeon: Rod Can, MD;  Location: [facility];  Service: Orthopedics;  Laterality: Right;  . UPPER GASTROINTESTINAL ENDOSCOPY    . VAGINAL HYSTERECTOMY      I have reviewed the social history and family history with the patient and they are unchanged from previous note.  ALLERGIES:  is allergic to codeine; other; and aspirin.  MEDICATIONS:  Current Outpatient Medications  Medication Sig Dispense Refill  . amLODipine (NORVASC) 10 MG tablet Take 10 mg by mouth every morning.     . Biotin 10000 MCG TABS Take 1 tablet by mouth daily.    . calcium-vitamin D (OSCAL WITH D) 500-200 MG-UNIT per tablet Take 1 tablet by  mouth every morning.    . fish oil-omega-3 fatty acids 1000 MG capsule Take 1 g by mouth every morning.     . folic acid (FOLVITE) 1 MG tablet Take 2 mg by mouth every morning.     [name] FREESTYLE LITE test strip TEST ONCE EVERY DAY  4  . Lidocaine 2 % GEL Apply 1 g topically as needed. 2 Tube 11  . lovastatin (MEVACOR) 40 MG tablet 1 (ONE) TABLET BY MOUTH TAKE IN THE EVENING AFTER DINNER  3  . meloxicam (MOBIC) 15 MG tablet Take 15 mg by mouth daily.  11  . metoprolol succinate (TOPROL-XL) 50 MG 24 hr tablet TAKE 1 TABLET AT BEDTIME ONCE A DAY ORALLY 90 DAYS  2  . valsartan-hydrochlorothiazide (DIOVAN-HCT) 320-12.5 MG per tablet Take 1 tablet by mouth daily.    . vitamin E 400 UNIT capsule Take 400 Units by mouth daily.    . hydroxypropyl methylcellulose / hypromellose (ISOPTO TEARS / GONIOVISC) 2.5 % ophthalmic solution Place 1 drop into both eyes daily as needed for dry eyes.    . pregabalin (LYRICA) 25 MG capsule Take 1 capsule (25 mg total) by mouth 3 (three) times daily. 90 capsule 0   No current facility-administered medications for this visit.     PHYSICAL EXAMINATION: ECOG PERFORMANCE STATUS: 2 - Symptomatic, <50% confined to bed  Vitals:   06/03/18 0907  BP: 130/60  Pulse: 70  Resp: 18  Temp: 97.8 F (36.6 C)  SpO2: 97%   Filed Weights   06/03/18 0907  Weight: 214 lb 14.4 oz (97.5 kg)    GENERAL:alert, no distress and comfortable SKIN: skin color, texture, turgor are normal, no rashes or significant lesions EYES: normal, Conjunctiva are pink and non-injected, sclera clear OROPHARYNX:no exudate, no erythema and lips, buccal mucosa, and tongue normal  NECK: supple, thyroid normal size, non-tender, without nodularity LYMPH:  no palpable lymphadenopathy in the cervical, axillary or inguinal LUNGS: clear to auscultation and percussion with normal breathing effort HEART: regular rate & rhythm and no murmurs and no lower extremity edema ABDOMEN:abdomen soft, non-tender and  normal bowel sounds Musculoskeletal:no cyanosis of digits and no clubbing  NEURO: alert & oriented x 3 with fluent speech, no focal motor/sensory deficits BREAST: (+) S/p left breast lumpectomy: Surgical incision healed well with 0.5cm Scar tissue of above incision at 2:00 position.  LABORATORY DATA:  I have reviewed the data as listed CBC Latest Ref Rng & Units 03/13/2018 03/07/2018 08/25/2017  WBC 4.0 - 10.5 K/uL 7.5 5.4 7.4  Hemoglobin 12.0 - 15.0 g/dL 11.4(L) 11.3(L) 12.1  Hematocrit 36.0 - 46.0 % 37.0 36.4 38.6  Platelets 150 - 400 K/uL 261 240 217     CMP Latest Ref Rng & Units 03/13/2018 03/07/2018 08/25/2017  Glucose 70 - 99 mg/dL 110(H) 113(H) 54(L)  BUN 8 - 23 mg/dL 29(H) 17 26  Creatinine 0.44 - 1.00 mg/dL 1.01(H) 0.97 1.22(H)  Sodium 135 - 145 mmol/L 143 144 145  Potassium 3.5 - 5.1 mmol/L 3.6 3.8 3.8  Chloride 98 - 111 mmol/L 107 105 109  CO2 22 - 32 mmol/L '29 28 27  '$ Calcium 8.9 - 10.3 mg/dL 9.2 9.4 10.0  Total Protein 6.5 - 8.1 g/dL - 7.3 7.5  Total Bilirubin 0.3 - 1.2 mg/dL - 0.6 0.6  Alkaline Phos 38 - 126 U/L - 88 97  AST 15 - 41 U/L - 21 24  ALT 0 - 44 U/L - 11 12      RADIOGRAPHIC STUDIES: I have personally reviewed the radiological images as listed and agreed with the findings in the report. No results found.   ASSESSMENT & PLAN:  DRAYA FELKER is a 85 y.o. female with   1. Malignant or new present of upper outer quadrant of left female breast, invasive ductal carcinoma, grade 3, pT1cN0M0, stage IA, ER-/PR-/HER2- -She was diagnosed in 09/2015. She is s/p left breast lumpectomy, adjuvant Taxol/Abraxane and adjuvant radiation.  -She is clinically doing well. Her physical exam and her 10/2017 mammogram were unremarkable except 0.5cm scar tissue above her left breast incision, which has been stable, likely surgical scar. There is no clinical concern for recurrence. I reassured her that her left breast shooting pain is related to her surgery.  -Next Mammogram in  10/2018 -F/u in 11/2018   2. MGUS  -02/2018 labs show her M-protein and light chain levels have been overall stable  -No clinical concern for disease progression. will continue follow up SPEP/IFE, and light chain levels every 3-6 months.   3. Joint and muscular pain, Rheumatoid arthritis, lumbar spondylosis -she will continue follow up with rheumatologist Dr. Dossie Der.  -She has chronic low back pain from lumber spondylosis -Recently started on Prednisone treatment by RA. Pain has much improved, controlled.   4. Anemia, Chronic   -improved, we'll continue monitoring. -On Folic Acid   5. Peripheral neuropathy, G2 -No significant impact on her function, we'll continue to monitor closely. She understands that recovery from neuropathy is a slow process. -She has also tried acupuncture which helped. -Continue taking Lyrica which was prescribed by neurologist and Vitamin B12.  -overall stable. Ambulates with cane.   6. HTN -Continue to Follow with Dr. Maudie Mercury on this.  -On amlodipine, valsartan-HCTZ, metoprolol -BP normal today, 130/60 (06/03/2018)   7. Short Term Memory loss -Pt previously shared concern for memory loss but she denied further medication to help. She previously declined clinical trial option. Practicing mental games and read and socialize to help was encouraged.   8. Osteoporosis  -10/2017 DEXA shows osteoporosis with lowest T-score of -2.7 at Forearm radius.  -She is at high fracture risk due to her frequent falls. I advised her to continue taking calcium and vitamin D.  -I again discussed oral and injectable bisphosphonate. I educated her about the importance of maintaining good dental hygiene and health, due to risk of jaw  necrosis. I provided her with reading material of alendronate. She think about it and discuss with PCP and rheumatologist -Continue Calcium and VitD    Plan Please move her next appointment from April to July 2020  [place] in 10/2018 She will  think about biphosphonate.  No problem-specific Assessment & Plan notes found for this encounter.   Orders Placed This Encounter  Procedures  . MM DIAG BREAST TOMO BILATERAL    Standing Status:   Future    Standing Expiration Date:   06/04/2019    Order Specific Question:   Reason for Exam (SYMPTOM  OR DIAGNOSIS REQUIRED)    Answer:   screening    Order Specific Question:   Preferred imaging location?    Answer:   [facility]   All questions were answered. The patient knows to call the clinic with any problems, questions or concerns. No barriers to learning was detected. I spent 20 minutes counseling the patient face to face. The total time spent in the appointment was 25 minutes and more than 50% was on counseling and review of test results     Truitt Merle, MD 06/03/2018   I, Joslyn Devon, am acting as scribe for Truitt Merle, MD.   I have reviewed the above documentation for accuracy and completeness, and I agree with the above.

## 2018-06-03 ENCOUNTER — Telehealth: Payer: Self-pay | Admitting: Hematology

## 2018-06-03 ENCOUNTER — Inpatient Hospital Stay: Payer: Medicare Other | Attending: Hematology | Admitting: Hematology

## 2018-06-03 ENCOUNTER — Encounter: Payer: Self-pay | Admitting: Hematology

## 2018-06-03 VITALS — BP 130/60 | HR 70 | Temp 97.8°F | Resp 18 | Ht 63.0 in | Wt 214.9 lb

## 2018-06-03 DIAGNOSIS — Z853 Personal history of malignant neoplasm of breast: Secondary | ICD-10-CM | POA: Insufficient documentation

## 2018-06-03 DIAGNOSIS — I10 Essential (primary) hypertension: Secondary | ICD-10-CM | POA: Diagnosis not present

## 2018-06-03 DIAGNOSIS — Z171 Estrogen receptor negative status [ER-]: Secondary | ICD-10-CM

## 2018-06-03 DIAGNOSIS — D472 Monoclonal gammopathy: Secondary | ICD-10-CM | POA: Diagnosis not present

## 2018-06-03 DIAGNOSIS — D649 Anemia, unspecified: Secondary | ICD-10-CM | POA: Insufficient documentation

## 2018-06-03 DIAGNOSIS — M47816 Spondylosis without myelopathy or radiculopathy, lumbar region: Secondary | ICD-10-CM | POA: Diagnosis not present

## 2018-06-03 DIAGNOSIS — M069 Rheumatoid arthritis, unspecified: Secondary | ICD-10-CM | POA: Insufficient documentation

## 2018-06-03 DIAGNOSIS — M810 Age-related osteoporosis without current pathological fracture: Secondary | ICD-10-CM | POA: Diagnosis not present

## 2018-06-03 DIAGNOSIS — C50411 Malignant neoplasm of upper-outer quadrant of right female breast: Secondary | ICD-10-CM

## 2018-06-03 NOTE — Telephone Encounter (Signed)
Printed calendar and avs. °

## 2018-06-06 DIAGNOSIS — M5030 Other cervical disc degeneration, unspecified cervical region: Secondary | ICD-10-CM | POA: Diagnosis not present

## 2018-06-06 DIAGNOSIS — M6281 Muscle weakness (generalized): Secondary | ICD-10-CM | POA: Diagnosis not present

## 2018-06-06 DIAGNOSIS — M0579 Rheumatoid arthritis with rheumatoid factor of multiple sites without organ or systems involvement: Secondary | ICD-10-CM | POA: Diagnosis not present

## 2018-06-06 DIAGNOSIS — Z79899 Other long term (current) drug therapy: Secondary | ICD-10-CM | POA: Diagnosis not present

## 2018-06-06 DIAGNOSIS — R700 Elevated erythrocyte sedimentation rate: Secondary | ICD-10-CM | POA: Diagnosis not present

## 2018-06-06 DIAGNOSIS — D472 Monoclonal gammopathy: Secondary | ICD-10-CM | POA: Diagnosis not present

## 2018-06-06 DIAGNOSIS — M549 Dorsalgia, unspecified: Secondary | ICD-10-CM | POA: Diagnosis not present

## 2018-06-06 DIAGNOSIS — M8580 Other specified disorders of bone density and structure, unspecified site: Secondary | ICD-10-CM | POA: Diagnosis not present

## 2018-06-06 DIAGNOSIS — M5137 Other intervertebral disc degeneration, lumbosacral region: Secondary | ICD-10-CM | POA: Diagnosis not present

## 2018-06-06 DIAGNOSIS — M130 Polyarthritis, unspecified: Secondary | ICD-10-CM | POA: Diagnosis not present

## 2018-06-13 DIAGNOSIS — E668 Other obesity: Secondary | ICD-10-CM | POA: Diagnosis not present

## 2018-06-13 DIAGNOSIS — I080 Rheumatic disorders of both mitral and aortic valves: Secondary | ICD-10-CM | POA: Diagnosis not present

## 2018-06-13 DIAGNOSIS — I119 Hypertensive heart disease without heart failure: Secondary | ICD-10-CM | POA: Diagnosis not present

## 2018-06-13 DIAGNOSIS — E785 Hyperlipidemia, unspecified: Secondary | ICD-10-CM | POA: Diagnosis not present

## 2018-06-24 DIAGNOSIS — M7051 Other bursitis of knee, right knee: Secondary | ICD-10-CM | POA: Diagnosis not present

## 2018-06-24 DIAGNOSIS — Z96651 Presence of right artificial knee joint: Secondary | ICD-10-CM | POA: Diagnosis not present

## 2018-07-18 DIAGNOSIS — M25561 Pain in right knee: Secondary | ICD-10-CM | POA: Diagnosis not present

## 2018-07-20 DIAGNOSIS — M25561 Pain in right knee: Secondary | ICD-10-CM | POA: Diagnosis not present

## 2018-07-25 DIAGNOSIS — M25561 Pain in right knee: Secondary | ICD-10-CM | POA: Diagnosis not present

## 2018-07-27 DIAGNOSIS — M25561 Pain in right knee: Secondary | ICD-10-CM | POA: Diagnosis not present

## 2018-07-31 ENCOUNTER — Other Ambulatory Visit: Payer: Self-pay

## 2018-07-31 ENCOUNTER — Emergency Department (HOSPITAL_COMMUNITY)
Admission: EM | Admit: 2018-07-31 | Discharge: 2018-07-31 | Disposition: A | Discharge status: Home / Self Care | Payer: Medicare Other | Attending: Emergency Medicine | Admitting: Emergency Medicine

## 2018-07-31 ENCOUNTER — Encounter (HOSPITAL_COMMUNITY): Payer: Self-pay | Admitting: Emergency Medicine

## 2018-07-31 ENCOUNTER — Emergency Department (HOSPITAL_COMMUNITY): Payer: Medicare Other

## 2018-07-31 DIAGNOSIS — R531 Weakness: Secondary | ICD-10-CM | POA: Insufficient documentation

## 2018-07-31 DIAGNOSIS — Z79899 Other long term (current) drug therapy: Secondary | ICD-10-CM | POA: Insufficient documentation

## 2018-07-31 DIAGNOSIS — I10 Essential (primary) hypertension: Secondary | ICD-10-CM | POA: Insufficient documentation

## 2018-07-31 DIAGNOSIS — E860 Dehydration: Secondary | ICD-10-CM | POA: Diagnosis not present

## 2018-07-31 DIAGNOSIS — R0602 Shortness of breath: Secondary | ICD-10-CM | POA: Diagnosis not present

## 2018-07-31 DIAGNOSIS — E1142 Type 2 diabetes mellitus with diabetic polyneuropathy: Secondary | ICD-10-CM | POA: Insufficient documentation

## 2018-07-31 DIAGNOSIS — R5383 Other fatigue: Secondary | ICD-10-CM | POA: Diagnosis present

## 2018-07-31 LAB — BASIC METABOLIC PANEL
ANION GAP: 5 (ref 5–15)
BUN: 27 mg/dL — ABNORMAL HIGH (ref 8–23)
CALCIUM: 9.1 mg/dL (ref 8.9–10.3)
CO2: 29 mmol/L (ref 22–32)
Chloride: 107 mmol/L (ref 98–111)
Creatinine, Ser: 0.9 mg/dL (ref 0.44–1.00)
GFR calc Af Amer: >60 mL/min (ref >60)
GFR calc non Af Amer: 59 mL/min — ABNORMAL LOW (ref >60)
GLUCOSE: 87 mg/dL (ref 70–99)
POTASSIUM: 3.4 mmol/L — AB (ref 3.5–5.1)
Sodium: 141 mmol/L (ref 135–145)

## 2018-07-31 LAB — URINALYSIS, ROUTINE W REFLEX MICROSCOPIC
Bilirubin Urine: NEGATIVE
GLUCOSE, UA: NEGATIVE mg/dL
HGB URINE DIPSTICK: NEGATIVE
Ketones, ur: NEGATIVE mg/dL
LEUKOCYTE UA: NEGATIVE
Nitrite: NEGATIVE
PROTEIN: NEGATIVE mg/dL
SPECIFIC GRAVITY, URINE: 1.009 (ref 1.005–1.030)
pH: 6 (ref 5.0–8.0)

## 2018-07-31 LAB — CBC
HCT: 38.3 % (ref 36.0–46.0)
Hemoglobin: 11.3 g/dL — ABNORMAL LOW (ref 12.0–15.0)
MCH: 26.4 pg (ref 26.0–34.0)
MCHC: 29.5 g/dL — ABNORMAL LOW (ref 30.0–36.0)
MCV: 89.5 fL (ref 80.0–100.0)
PLATELETS: 249 10*3/uL (ref 150–400)
RBC: 4.28 MIL/uL (ref 3.87–5.11)
RDW: 17.5 % — ABNORMAL HIGH (ref 11.5–15.5)
WBC: 5.3 10*3/uL (ref 4.0–10.5)
nRBC: 0 % (ref 0.0–0.2)

## 2018-07-31 LAB — CK: Total CK: 79 U/L (ref 38–234)

## 2018-07-31 LAB — CBG MONITORING, ED: GLUCOSE-CAPILLARY: 105 mg/dL — AB (ref 70–99)

## 2018-07-31 MED ORDER — SODIUM CHLORIDE 0.9 % IV BOLUS (SEPSIS)
1000.0000 mL | Freq: Once | INTRAVENOUS | Status: AC
  Administered 2018-07-31: 1000 mL via INTRAVENOUS

## 2018-07-31 NOTE — ED Triage Notes (Signed)
Pt c/o fatigue, weakness with little diarrhea for couple days. Denies being around anyone sick.

## 2018-07-31 NOTE — ED Notes (Signed)
Repeat EKG and cbg are being performed as I write this. I have asked my colleague to start u/s-guided IV d/t poor surface veins. Pt. Is in no distress.

## 2018-07-31 NOTE — ED Notes (Signed)
Pt went to restroom via wheelchair and back to bed.

## 2018-07-31 NOTE — ED Provider Notes (Signed)
[facility] DEPT Provider Note   CSN: [id number] Arrival date & time: 07/31/18  1227    History   Chief Complaint Chief Complaint  Patient presents with  . Fatigue  . Diarrhea   Patient gives permission to perform history and physical in front of family/friend  HPI Julie [name] is a 85 y.o. female.     The history is provided by the patient and a relative.  Diarrhea  Severity:  Moderate Timing:  Intermittent Progression:  Improving Relieved by:  Nothing Worsened by:  Nothing Associated symptoms: myalgias   Associated symptoms: no abdominal pain, no recent cough, no fever and no vomiting   Patient reports she takes magnesium for constipation.  She recently increased her magnesium intake and began having loose or liquid stools.  Stools are nonbloody.  No vomiting.  She has scaled back to magnesium, but her bowel habits are increased.  She also reports generalized fatigue.  She reports feeling mildly anxious.  No active chest pain.  She reports mild shortness of breath.  No cough. She also reports she started increasing her lovastatin she is having generalized weakness and muscle cramps  Past Medical History:  Diagnosis Date  . Anemia   . Anxiety    takes Klonopin at bedtime  . Arthritis    takes Methotrexate weekly and Prednisone  . Blood transfusion    no abnormal reaction noted  . Cancer (HCC)    breast   . Cataracts, bilateral    immature  . Complication of anesthesia    wakes up during surgery  . Depression   . Diabetes mellitus without complication ([place])   . Dyspnea    with exertion since chemo   . Fibromyalgia    takes Lyrica daily  . GERD (gastroesophageal reflux disease)    takes Nexium daily  . Glaucoma   . Hard of hearing   . Heart murmur   . Histoplasmosis   . History of colon polyps   . History of gout   . History of hiatal hernia   . History of shingles   . Hyperlipidemia   . Hypertension    takes  Amlodipine,Metoprolol,and Losartan daily  . Incontinence of bowel   . Interstitial cystitis   . Joint pain   . Low back pain   . Nocturia   . Peripheral neuropathy   . Peripheral neuropathy   . Personal history of chemotherapy   . Personal history of radiation therapy   . PONV (postoperative nausea and vomiting)   . Ulcer 1970   duodenal  . Urinary frequency   . Urinary urgency   . Weakness    numbness in both hands and feet    Patient Active Problem List   Diagnosis Date Noted  . Peripheral neuropathy 06/08/2016  . Low back pain without sciatica 06/08/2016  . Abnormality of gait 06/08/2016  . Anemia due to antineoplastic chemotherapy 03/06/2016  . Hypersensitivity reaction 01/07/2016  . Port catheter in place 01/03/2016  . Malignant neoplasm of upper outer quadrant of female breast ([place]) 10/30/2015  . Primary osteoarthritis of right knee 02/28/2015  . Post-traumatic arthritis of ankle 05/10/2014  . MGUS (monoclonal gammopathy of unknown significance) 02/17/2012  . Diverticulosis of colon (without mention of hemorrhage) 01/21/2011  . Family history of malignant neoplasm of gastrointestinal tract 01/21/2011  . Special screening for malignant neoplasms, colon 01/21/2011  . Other symptoms involving digestive system(787.99) 01/21/2011    Past Surgical History:  Procedure Laterality Date  .  ABDOMINAL HYSTERECTOMY    . accupuncture  3 yrs ago  . ANKLE FUSION Left 05/10/2014   Procedure: LEFT ANKLE ARTHRODESIS ;  Surgeon: Wylene Simmer, MD;  Location: [facility];  Service: Orthopedics;  Laterality: Left;  . ANKLE SURGERY Left   . arm surgery Left    radius  . bladder tacked     . BREAST LUMPECTOMY Left   . CERVICAL FUSION    . CHOLECYSTECTOMY    . COLONOSCOPY    . CYSTOCELE REPAIR    . DILATION AND CURETTAGE OF UTERUS    . ESOPHAGOGASTRODUODENOSCOPY    . PORT A CATH REVISION N/A 12/27/2015   Procedure: PORT A CATH REVISION;  Surgeon: Rolm Bookbinder, MD;  Location: [facility];  Service: General;  Laterality: N/A;  . PORT-A-CATH REMOVAL N/A 03/25/2016   Procedure: REMOVAL PORT-A-CATH;  Surgeon: Rolm Bookbinder, MD;  Location: [facility];  Service: General;  Laterality: N/A;  . PORTACATH PLACEMENT Right 12/27/2015   Procedure: INSERTION PORT-A-CATH WITH ULTRASOUND ;  Surgeon: Rolm Bookbinder, MD;  Location: [facility];  Service: General;  Laterality: Right;  . RADIOACTIVE SEED GUIDED PARTIAL MASTECTOMY WITH AXILLARY SENTINEL LYMPH NODE BIOPSY Left 11/14/2015   Procedure: RADIOACTIVE SEED GUIDED LEFT BREAST LUMPECTOMY WITH AXILLARY SENTINEL LYMPH NODE BIOPSY;  Surgeon: Rolm Bookbinder, MD;  Location: [facility];  Service: General;  Laterality: Left;  RADIOACTIVE SEED GUIDED LEFT BREAST LUMPECTOMY WITH AXILLARY SENTINEL LYMPH NODE BIOPSY  . SALPINGOOPHORECTOMY Bilateral   . TOTAL KNEE ARTHROPLASTY Right 02/28/2015   Procedure: RIGHT TOTAL KNEE ARTHROPLASTY;  Surgeon: Rod Can, MD;  Location: [facility];  Service: Orthopedics;  Laterality: Right;  . UPPER GASTROINTESTINAL ENDOSCOPY    . VAGINAL HYSTERECTOMY       OB History   No obstetric history on file.      Home Medications    Prior to Admission medications   Medication Sig Start Date End Date Taking? Authorizing Provider  amLODipine (NORVASC) 10 MG tablet Take 10 mg by mouth every morning.     [provider]  Biotin 10000 MCG TABS Take 1 tablet by mouth daily.    [provider]  calcium-vitamin D (OSCAL WITH D) 500-200 MG-UNIT per tablet Take 1 tablet by mouth every morning.    [provider]  fish oil-omega-3 fatty acids 1000 MG capsule Take 1 g by mouth every morning.     [provider]  folic acid (FOLVITE) 1 MG tablet Take 2 mg by mouth every morning.     [provider]  FREESTYLE LITE test strip TEST ONCE EVERY DAY 01/27/17   [provider]  hydroxypropyl methylcellulose / hypromellose (ISOPTO TEARS /  GONIOVISC) 2.5 % ophthalmic solution Place 1 drop into both eyes daily as needed for dry eyes.    [provider]  Lidocaine 2 % GEL Apply 1 g topically as needed. 07/23/16   Marcial Pacas, MD  lovastatin (MEVACOR) 40 MG tablet 1 (ONE) TABLET BY MOUTH TAKE IN THE EVENING AFTER DINNER 02/17/17   [provider]  meloxicam (MOBIC) 15 MG tablet Take 15 mg by mouth daily. 11/09/15   [provider]  metoprolol succinate (TOPROL-XL) 50 MG 24 hr tablet TAKE 1 TABLET AT BEDTIME ONCE A DAY ORALLY 90 DAYS 08/29/15   [provider]  pregabalin (LYRICA) 25 MG capsule Take 1 capsule (25 mg total) by mouth 3 (three) times daily. 03/13/18 04/12/18  Duffy Bruce, MD  valsartan-hydrochlorothiazide (DIOVAN-HCT) 320-12.5  MG per tablet Take 1 tablet by mouth daily.    [provider]  vitamin E 400 UNIT capsule Take 400 Units by mouth daily.    [provider]    Family History Family History  Problem Relation Age of Onset  . Colon cancer Sister 63  . Esophageal cancer Brother 46  . Breast cancer Mother   . Brain cancer Other     Social History Social History   Tobacco Use  . Smoking status: Never Smoker  . Smokeless tobacco: Never Used  Substance Use Topics  . Alcohol use: No    Comment: IN [facility] ONLY  . Drug use: No     Allergies   Codeine; Other; and Aspirin   Review of Systems Review of Systems  Constitutional: Negative for fever.  Respiratory: Positive for shortness of breath.   Cardiovascular: Negative for chest pain.  Gastrointestinal: Positive for diarrhea. Negative for abdominal pain and vomiting.  Musculoskeletal: Positive for myalgias.  Psychiatric/Behavioral: The patient is nervous/anxious.   All other systems reviewed and are negative.    Physical Exam Updated Vital Signs Resp 13   Physical Exam CONSTITUTIONAL: Well developed/well nourished, appears younger than stated age HEAD: Normocephalic/atraumatic EYES:  EOMI/PERRL ENMT: Mucous membranes moist NECK: supple no meningeal signs SPINE/BACK:entire spine nontender CV: S1/S2 noted, no murmurs/rubs/gallops noted LUNGS: Lungs are clear to auscultation bilaterally, no apparent distress ABDOMEN: soft, nontender, no rebound or guarding, bowel sounds noted throughout abdomen GU:no cva tenderness NEURO: Pt is awake/alert/appropriate, moves all extremitiesx4.  No facial droop.  No arm or leg drift EXTREMITIES: pulses normal/equal, full ROM SKIN: warm, color normal PSYCH: no abnormalities of mood noted, alert and oriented to situation   ED Treatments / Results  Labs (all labs ordered are listed, but only abnormal results are displayed) Labs Reviewed  BASIC METABOLIC PANEL - Abnormal; Notable for the following components:      Result Value   Potassium 3.4 (*)    BUN 27 (*)    GFR calc non Af Amer 59 (*)    All other components within normal limits  CBC - Abnormal; Notable for the following components:   Hemoglobin 11.3 (*)    MCHC 29.5 (*)    RDW 17.5 (*)    All other components within normal limits  URINALYSIS, ROUTINE W REFLEX MICROSCOPIC - Abnormal; Notable for the following components:   Color, Urine STRAW (*)    All other components within normal limits  CBG MONITORING, ED - Abnormal; Notable for the following components:   Glucose-Capillary 105 (*)    All other components within normal limits  CK    EKG EKG Interpretation  Date/Time:  Sunday July 31 2018 13:02:15 EDT Ventricular Rate:  58 PR Interval:    QRS Duration: 90 QT Interval:  430 QTC Calculation: 423 R Axis:   36 Text Interpretation:  Sinus rhythm Abnormal R-wave progression, early transition Interpretation limited secondary to artifact Confirmed by Ripley Fraise [phone]) on 07/31/2018 1:18:10 PM   Radiology No results found.  Procedures Procedures   Medications Ordered in ED Medications  sodium chloride 0.9 % bolus 1,000 mL (1,000 mLs Intravenous New  Bag/Given 07/31/18 1425)     Initial Impression / Assessment and Plan / ED Course  I have reviewed the triage vital signs and the nursing notes.  Pertinent labs & imaging results that were available during my care of the patient were reviewed by me and considered in my medical decision making (see chart for details).  4:11 PM  Patient found to have mild dehydration.  She overall feels improved.  She is at her baseline, taking p.o. fluids and eating a sandwich.  She feels comfortable for  discharge home.  Suspect mild dehydration due to recent loose stool from magnesium.  She was advised to  scale back use.  She is very high functioning for age, and she is appropriate for discharge home  Final Clinical Impressions(s) / ED Diagnoses   Final diagnoses:  Dehydration  Weakness    ED Discharge Orders    None       Ripley Fraise, MD 07/31/18 [phone]

## 2018-07-31 NOTE — ED Notes (Signed)
Pt provided with graham crackers and coffee

## 2018-07-31 NOTE — Discharge Instructions (Addendum)

## 2018-08-01 DIAGNOSIS — M25561 Pain in right knee: Secondary | ICD-10-CM | POA: Diagnosis not present

## 2018-08-08 DIAGNOSIS — M25561 Pain in right knee: Secondary | ICD-10-CM | POA: Diagnosis not present

## 2018-08-10 ENCOUNTER — Other Ambulatory Visit: Payer: Self-pay | Admitting: Cardiology

## 2018-08-11 ENCOUNTER — Encounter: Payer: Self-pay | Admitting: Family Medicine

## 2018-08-11 DIAGNOSIS — I119 Hypertensive heart disease without heart failure: Secondary | ICD-10-CM | POA: Diagnosis not present

## 2018-08-11 DIAGNOSIS — E785 Hyperlipidemia, unspecified: Secondary | ICD-10-CM | POA: Diagnosis not present

## 2018-08-11 DIAGNOSIS — E119 Type 2 diabetes mellitus without complications: Secondary | ICD-10-CM | POA: Diagnosis not present

## 2018-08-18 DIAGNOSIS — H9193 Unspecified hearing loss, bilateral: Secondary | ICD-10-CM | POA: Diagnosis not present

## 2018-08-18 DIAGNOSIS — H6120 Impacted cerumen, unspecified ear: Secondary | ICD-10-CM | POA: Diagnosis not present

## 2018-08-18 DIAGNOSIS — I10 Essential (primary) hypertension: Secondary | ICD-10-CM | POA: Diagnosis not present

## 2018-08-18 DIAGNOSIS — E119 Type 2 diabetes mellitus without complications: Secondary | ICD-10-CM | POA: Diagnosis not present

## 2018-08-18 DIAGNOSIS — E785 Hyperlipidemia, unspecified: Secondary | ICD-10-CM | POA: Diagnosis not present

## 2018-08-18 DIAGNOSIS — M0579 Rheumatoid arthritis with rheumatoid factor of multiple sites without organ or systems involvement: Secondary | ICD-10-CM | POA: Diagnosis not present

## 2018-09-05 ENCOUNTER — Other Ambulatory Visit: Payer: Medicare Other

## 2018-09-05 ENCOUNTER — Ambulatory Visit: Payer: Medicare Other | Admitting: Hematology

## 2018-09-05 DIAGNOSIS — M6281 Muscle weakness (generalized): Secondary | ICD-10-CM | POA: Diagnosis not present

## 2018-09-05 DIAGNOSIS — R700 Elevated erythrocyte sedimentation rate: Secondary | ICD-10-CM | POA: Diagnosis not present

## 2018-09-05 DIAGNOSIS — M8580 Other specified disorders of bone density and structure, unspecified site: Secondary | ICD-10-CM | POA: Diagnosis not present

## 2018-09-05 DIAGNOSIS — M5030 Other cervical disc degeneration, unspecified cervical region: Secondary | ICD-10-CM | POA: Diagnosis not present

## 2018-09-05 DIAGNOSIS — M0579 Rheumatoid arthritis with rheumatoid factor of multiple sites without organ or systems involvement: Secondary | ICD-10-CM | POA: Diagnosis not present

## 2018-09-05 DIAGNOSIS — D472 Monoclonal gammopathy: Secondary | ICD-10-CM | POA: Diagnosis not present

## 2018-09-05 DIAGNOSIS — M549 Dorsalgia, unspecified: Secondary | ICD-10-CM | POA: Diagnosis not present

## 2018-09-05 DIAGNOSIS — M130 Polyarthritis, unspecified: Secondary | ICD-10-CM | POA: Diagnosis not present

## 2018-09-05 DIAGNOSIS — M5137 Other intervertebral disc degeneration, lumbosacral region: Secondary | ICD-10-CM | POA: Diagnosis not present

## 2018-09-05 DIAGNOSIS — Z79899 Other long term (current) drug therapy: Secondary | ICD-10-CM | POA: Diagnosis not present

## 2018-09-20 DIAGNOSIS — M4316 Spondylolisthesis, lumbar region: Secondary | ICD-10-CM | POA: Diagnosis not present

## 2018-09-20 DIAGNOSIS — M4800 Spinal stenosis, site unspecified: Secondary | ICD-10-CM | POA: Diagnosis not present

## 2018-09-20 DIAGNOSIS — R079 Chest pain, unspecified: Secondary | ICD-10-CM | POA: Diagnosis not present

## 2018-09-20 DIAGNOSIS — F418 Other specified anxiety disorders: Secondary | ICD-10-CM | POA: Diagnosis not present

## 2018-09-20 DIAGNOSIS — H6120 Impacted cerumen, unspecified ear: Secondary | ICD-10-CM | POA: Diagnosis not present

## 2018-11-14 DIAGNOSIS — H524 Presbyopia: Secondary | ICD-10-CM | POA: Diagnosis not present

## 2018-11-14 DIAGNOSIS — Z961 Presence of intraocular lens: Secondary | ICD-10-CM | POA: Diagnosis not present

## 2018-11-14 DIAGNOSIS — E119 Type 2 diabetes mellitus without complications: Secondary | ICD-10-CM | POA: Diagnosis not present

## 2018-11-16 ENCOUNTER — Other Ambulatory Visit: Payer: Self-pay

## 2018-11-16 ENCOUNTER — Ambulatory Visit
Admission: RE | Admit: 2018-11-16 | Discharge: 2018-11-16 | Disposition: A | Discharge status: Home / Self Care | Payer: Medicare Other | Source: Ambulatory Visit | Attending: Hematology | Admitting: Hematology

## 2018-11-16 DIAGNOSIS — Z853 Personal history of malignant neoplasm of breast: Secondary | ICD-10-CM | POA: Diagnosis not present

## 2018-11-16 DIAGNOSIS — R921 Mammographic calcification found on diagnostic imaging of breast: Secondary | ICD-10-CM | POA: Diagnosis not present

## 2018-11-16 DIAGNOSIS — Z171 Estrogen receptor negative status [ER-]: Secondary | ICD-10-CM

## 2018-11-16 DIAGNOSIS — C50411 Malignant neoplasm of upper-outer quadrant of right female breast: Secondary | ICD-10-CM

## 2018-12-01 NOTE — Progress Notes (Signed)
[place]   [phone] [phone]   Clinic Follow up Note   Patient Care Team: [name], DO as PCP - General (Family Medicine) Valinda Party, MD as Referring Physician (Rheumatology) Rolm Bookbinder, MD as Consulting Physician (General Surgery) Truitt Merle, MD as Consulting Physician (Hematology) Delice Bison Charlestine Massed, NP as Nurse Practitioner (Hematology and Oncology)  Date of Service:  12/05/2018  CHIEF COMPLAINT: F/u of left breast cancer   SUMMARY OF ONCOLOGIC HISTORY: Oncology History Overview Note  Malignant neoplasm of upper outer quadrant of female breast [facility])   Staging form: Breast, AJCC 7th Edition     Pathologic stage from 11/14/2015: Stage IA (T1c, N0, cM0) - Signed by Truitt Merle, MD on 11/25/2015     Clinical stage from 11/20/2015: Stage IA (T1c, N0, M0) - Signed by Curt Bears, MD on 11/20/2015     Malignant neoplasm of upper outer quadrant of female breast ([place])  10/09/2015 Mammogram   Screening bilateral mammograms showed a possible left breast mass. Diagnostic mammogram and ultrasound showed a 7 x 7 x 7 mm mass in the left breast at 11:00 position.   10/22/2015 Receptors her2   ER negative, PR negative, HER-2 not amplified   10/22/2015 Initial Biopsy   Left breast needle core biopsy at the 1:00 position showed invasive ductal carcinoma.   10/22/2015 Initial Diagnosis   Malignant neoplasm of upper outer quadrant of female breast ([place])   11/14/2015 Surgery   Left breast lumpectomy and sentinel lymph node biopsy [name])   11/14/2015 Pathology Results   Left breast lumpectomy showed invasive ductal carcinoma, grade 3, 1.1 cm, resection margins were negative, sentinel lymph node biopsy showed benign breast parenchyma, lymph node tissue not identified. Repeated ER PR and HER-2 were all negative.   12/20/2015 - 03/13/2016 Chemotherapy   Weekly Taxol 80 mg/m x 2 cycles, then changed to Abraxane 100 mg/m for cycles  3-12 due to infusion reaction. Abraxane dose-reduced cycle #9, then again for cycles #11 & #12 due to neuropathy. [total 12 cycles chemo]    04/01/2016 - 05/11/2016 Radiation Therapy   Adjuvant breast radiation ([place]). Left breast: 50.4 Gy in 28 fractions   06/19/2016 Imaging   MR Lumbar Spine without contrast  IMPRESSION: 1. Diffuse lumbar spondylosis with discogenic and facet degenerative changes progressed from 2010 lumbar MRI. 2. Multilevel canal stenosis is mild-to-moderate at L2-3, moderate L3-4, and mild at L4-5. 3. Multiple levels of mild and moderate neural foraminal narrowing with severe foraminal narrowing at the right L3-4 and L4-5 levels.   11/12/2017 Imaging   11/12/2017 DEXA ASSESSMENT: The BMD measured at Forearm Radius 33% is 0.648 g/cm2 with a T-score of -2.7. This patient is considered osteoporotic according to [facility]) criteria.   11/12/2017 Mammogram   11/12/2017 Diagnostic Mammogram IMPRESSION: No mammographic evidence of malignancy in either breast, status post left lumpectomy.      CURRENT THERAPY:  Surveillance   INTERVAL HISTORY:  Julie [name] is here for a follow up left breast cancer. She was last seen by me 6 months ago. She presents to the clinic alone. She notes she has back pain from stenosis. She also gained 20 pounds. She is not very active due to her back pain and over eating. She would like to see a dietician. She has orthopedic surgeon but did not think she was eligible for surgery.     REVIEW OF SYSTEMS:   Constitutional: Denies fevers, chills or abnormal weight loss (+) weight  gain  Eyes: Denies blurriness of vision Ears, nose, mouth, throat, and face: Denies mucositis or sore throat Respiratory: Denies cough, dyspnea or wheezes Cardiovascular: Denies palpitation, chest discomfort or lower extremity swelling Gastrointestinal:  Denies nausea, heartburn or change in bowel habits Skin: Denies abnormal skin rashes  MSK: (+) back pain from spinal stenosis  Lymphatics: Denies new lymphadenopathy or easy bruising Neurological:Denies numbness, tingling or new weaknesses Behavioral/Psych: Mood is stable, no new changes  All other systems were reviewed with the patient and are negative.  MEDICAL HISTORY:  Past Medical History:  Diagnosis Date  . Anemia   . Anxiety    takes Klonopin at bedtime  . Arthritis    takes Methotrexate weekly and Prednisone  . Blood transfusion    no abnormal reaction noted  . Cancer (HCC)    breast   . Cataracts, bilateral    immature  . Complication of anesthesia    wakes up during surgery  . Depression   . Diabetes mellitus without complication ([place])   . Dyspnea    with exertion since chemo   . Fibromyalgia    takes Lyrica daily  . GERD (gastroesophageal reflux disease)    takes Nexium daily  . Glaucoma   . Hard of hearing   . Heart murmur   . Histoplasmosis   . History of colon polyps   . History of gout   . History of hiatal hernia   . History of shingles   . Hyperlipidemia   . Hypertension    takes Amlodipine,Metoprolol,and Losartan daily  . Incontinence of bowel   . Interstitial cystitis   . Joint pain   . Low back pain   . Nocturia   . Peripheral neuropathy   . Peripheral neuropathy   . Personal history of chemotherapy   . Personal history of radiation therapy   . PONV (postoperative nausea and vomiting)   . Ulcer 1970   duodenal  . Urinary frequency   . Urinary urgency   . Weakness    numbness in both hands and feet    SURGICAL HISTORY: Past Surgical History:  Procedure Laterality Date  . ABDOMINAL HYSTERECTOMY    . accupuncture  3 yrs ago  . ANKLE FUSION Left 05/10/2014   Procedure: LEFT ANKLE ARTHRODESIS ;  Surgeon: Wylene Simmer, MD;  Location: [place];  Service: Orthopedics;  Laterality: Left;  . ANKLE SURGERY Left   . arm surgery Left    radius  . bladder tacked     . BREAST LUMPECTOMY Left   . CERVICAL FUSION    .  CHOLECYSTECTOMY    . COLONOSCOPY    . CYSTOCELE REPAIR    . DILATION AND CURETTAGE OF UTERUS    . ESOPHAGOGASTRODUODENOSCOPY    . PORT A CATH REVISION N/A 12/27/2015   Procedure: PORT A CATH REVISION;  Surgeon: Rolm Bookbinder, MD;  Location: [place];  Service: General;  Laterality: N/A;  . PORT-A-CATH REMOVAL N/A 03/25/2016   Procedure: REMOVAL PORT-A-CATH;  Surgeon: Rolm Bookbinder, MD;  Location: [facility];  Service: General;  Laterality: N/A;  . PORTACATH PLACEMENT Right 12/27/2015   Procedure: INSERTION PORT-A-CATH WITH ULTRASOUND ;  Surgeon: Rolm Bookbinder, MD;  Location: [place];  Service: General;  Laterality: Right;  . RADIOACTIVE SEED GUIDED PARTIAL MASTECTOMY WITH AXILLARY SENTINEL LYMPH NODE BIOPSY Left 11/14/2015   Procedure: RADIOACTIVE SEED GUIDED LEFT BREAST LUMPECTOMY WITH AXILLARY SENTINEL LYMPH NODE BIOPSY;  Surgeon: Rolm Bookbinder, MD;  Location: [facility];  Service: General;  Laterality: Left;  RADIOACTIVE SEED GUIDED LEFT BREAST LUMPECTOMY WITH AXILLARY SENTINEL LYMPH NODE BIOPSY  . SALPINGOOPHORECTOMY Bilateral   . TOTAL KNEE ARTHROPLASTY Right 02/28/2015   Procedure: RIGHT TOTAL KNEE ARTHROPLASTY;  Surgeon: Rod Can, MD;  Location: [facility];  Service: Orthopedics;  Laterality: Right;  . UPPER GASTROINTESTINAL ENDOSCOPY    . VAGINAL HYSTERECTOMY      I have reviewed the social history and family history with the patient and they are unchanged from previous note.  ALLERGIES:  is allergic to codeine; other; and aspirin.  MEDICATIONS:  Current Outpatient Medications  Medication Sig Dispense Refill  . amLODipine (NORVASC) 10 MG tablet Take 10 mg by mouth every morning.     . Biotin 10000 MCG TABS Take 1 tablet by mouth daily.    . calcium-vitamin D (OSCAL WITH D) 500-200 MG-UNIT per tablet Take 1 tablet by mouth every morning.    . fish oil-omega-3 fatty acids 1000 MG capsule Take 1 g by mouth every morning.      . folic acid (FOLVITE) 1 MG tablet Take 2 mg by mouth every morning.     [name] FREESTYLE LITE test strip TEST ONCE EVERY DAY  4  . hydroxypropyl methylcellulose / hypromellose (ISOPTO TEARS / GONIOVISC) 2.5 % ophthalmic solution Place 1 drop into both eyes daily as needed for dry eyes.    . Lidocaine 2 % GEL Apply 1 g topically as needed. 2 Tube 11  . lovastatin (MEVACOR) 40 MG tablet 1 (ONE) TABLET TAKE IN THE EVENING AFTER DINNER 90 tablet 1  . meloxicam (MOBIC) 15 MG tablet Take 15 mg by mouth daily.  11  . metoprolol succinate (TOPROL-XL) 50 MG 24 hr tablet TAKE 1 TABLET AT BEDTIME ONCE A DAY ORALLY 90 DAYS  2  . valsartan-hydrochlorothiazide (DIOVAN-HCT) 320-12.5 MG per tablet Take 1 tablet by mouth daily.    . vitamin E 400 UNIT capsule Take 400 Units by mouth daily.    . pregabalin (LYRICA) 25 MG capsule Take 1 capsule (25 mg total) by mouth 3 (three) times daily. 90 capsule 0   No current facility-administered medications for this visit.     PHYSICAL EXAMINATION: ECOG PERFORMANCE STATUS: 3 - Symptomatic, >50% confined to bed  Vitals:   12/05/18 1058  BP: (!) 157/84  Pulse: 62  Resp: 20  Temp: 99.1 F (37.3 C)  SpO2: 91%   Filed Weights   12/05/18 1058  Weight: 221 lb 11.2 oz (100.6 kg)    GENERAL:alert, no distress and comfortable SKIN: skin color, texture, turgor are normal, no rashes or significant lesions EYES: normal, Conjunctiva are pink and non-injected, sclera clear  NECK: supple, thyroid normal size, non-tender, without nodularity LYMPH:  no palpable lymphadenopathy in the cervical, axillary  LUNGS: clear to auscultation and percussion with normal breathing effort HEART: regular rate & rhythm and no murmurs and no lower extremity edema ABDOMEN:abdomen soft, non-tender and normal bowel sounds Musculoskeletal:no cyanosis of digits and no clubbing  NEURO: alert & oriented x 3 with fluent speech, no focal motor/sensory deficits BREAST: S/p left lumpectomy: Surgical  incisions healed well with palpable 0.5x1cm nodule at UOQ at 2:00 position of left breast 2-3 cm from nipple (+) Skin hyperpigmentation from RT. No palpable abnormality in right breast or bilateral axilla  LABORATORY DATA:  I have reviewed the data as listed CBC Latest Ref Rng & Units 12/05/2018 07/31/2018 03/13/2018  WBC 4.0 - 10.5 K/uL 4.3 5.3 7.5  Hemoglobin 12.0 - 15.0 g/dL 11.3(L) 11.3(L) 11.4(L)  Hematocrit 36.0 - 46.0 % 36.5 38.3 37.0  Platelets 150 - 400 K/uL 201 249 261     CMP Latest Ref Rng & Units 12/05/2018 07/31/2018 03/13/2018  Glucose 70 - 99 mg/dL 88 87 110(H)  BUN 8 - 23 mg/dL 24(H) 27(H) 29(H)  Creatinine 0.44 - 1.00 mg/dL 1.10(H) 0.90 1.01(H)  Sodium 135 - 145 mmol/L 145 141 143  Potassium 3.5 - 5.1 mmol/L 4.2 3.4(L) 3.6  Chloride 98 - 111 mmol/L 108 107 107  CO2 22 - 32 mmol/L '30 29 29  '$ Calcium 8.9 - 10.3 mg/dL 9.2 9.1 9.2  Total Protein 6.5 - 8.1 g/dL 7.2 - -  Total Bilirubin 0.3 - 1.2 mg/dL 0.6 - -  Alkaline Phos 38 - 126 U/L 82 - -  AST 15 - 41 U/L 22 - -  ALT 0 - 44 U/L 13 - -      RADIOGRAPHIC STUDIES: I have personally reviewed the radiological images as listed and agreed with the findings in the report. No results found.   ASSESSMENT & PLAN:  Julie [name] is a 86 y.o. female with   1. Malignant or new present of upper outer quadrant of left female breast, invasive ductal carcinoma, grade 3, pT1cN0M0, stage IA, ER-/PR-/HER2- -She was diagnosed in 09/2015. She is s/p left breast lumpectomy, adjuvant Taxol/Abraxane and adjuvant radiation.  -She is clinically doing well. Lab reviewed, her CBC and CMP are within normal limits except Hg 11.3, BUN 24, Cr 1.10. Her physical exam and her 10/2018 mammogram show palpable fat necrosis at incision site of left breast, will monitor in the future. There is no clinical concern for recurrence. -It has been 3 years since her diagnosis. At this point her risk of recurrence has decreased.  -Continue Surveillance. Next  Mammogram in 10/2019 -F/u in 6 months with lab    2. MGUS  -02/2018 labs show her M-protein and light chain levels have been overall stable  -No clinical concern for disease progression. will continue follow up SPEP/IFE, and light chain levels every 3-6 months. -MM panel still pending today (12/05/18). M-protein has been stable at 0.8 for the past 2 years.   3.Joint and muscular pain, Rheumatoid arthritis, lumbar spondylosis -she will continue follow up with rheumatologist Dr. Dossie Der.  -She has chronic low back pain from lumber spondylosis -Recently started on Prednisone treatment by RA. Pain has much improved, controlled.  -Her chronic back pain continues to progress over the years. She has been seen by orthopedic surgeon about her spinal stenosis, but surgery was not recommended.   4. Anemia, Chronic   -improved,we'll continue monitoring. -On Folic Acid   5.Peripheral neuropathy, G1-2 -No significant impact on her function, we'll continue to monitor closely. She understands that recovery from neuropathy is a slow process. -She has also triedacupuncture which helped. -Continue taking Lyrica which was prescribed by neurologistand Vitamin B12.  -overall stable. Ambulates with walker  6. HTN -Continue to Follow with Dr. Maudie Mercury on this.  -On amlodipine, valsartan-HCTZ, metoprolol -BP 157/84 (12/05/2018)   7. Short Term Memory loss -Pt previously shared concern for memory loss but she denied further medication to help. She previously declined clinical trial option. Practicing mental games and read and socialize to help was encouraged.   8. Osteoporosis -10/2017 DEXA shows osteoporosis with lowest T-score of -2.7 at Forearm radius. -She is at high fracture risk due to her frequent falls. I advised her to continue taking calcium and  vitamin D.  -We previously discussed oral and injectable bisphosphonate. She think about it and discuss with PCP and rheumatologist -Continue  Calcium and VitD    Plan -She is clinically doing well  -Lab and f/u in 6 months    No problem-specific Assessment & Plan notes found for this encounter.   No orders of the defined types were placed in this encounter.  All questions were answered. The patient knows to call the clinic with any problems, questions or concerns. No barriers to learning was detected. I spent 20 minutes counseling the patient face to face. The total time spent in the appointment was 25 minutes and more than 50% was on counseling and review of test results     Truitt Merle, MD 12/05/2018   I, Joslyn Devon, am acting as scribe for Truitt Merle, MD.   I have reviewed the above documentation for accuracy and completeness, and I agree with the above.

## 2018-12-05 ENCOUNTER — Other Ambulatory Visit: Payer: Self-pay

## 2018-12-05 ENCOUNTER — Ambulatory Visit: Payer: Self-pay | Admitting: Cardiology

## 2018-12-05 ENCOUNTER — Telehealth: Payer: Self-pay | Admitting: Hematology

## 2018-12-05 ENCOUNTER — Encounter: Payer: Self-pay | Admitting: Hematology

## 2018-12-05 ENCOUNTER — Inpatient Hospital Stay (HOSPITAL_BASED_OUTPATIENT_CLINIC_OR_DEPARTMENT_OTHER): Payer: Medicare Other | Admitting: Hematology

## 2018-12-05 ENCOUNTER — Inpatient Hospital Stay: Payer: Medicare Other | Attending: Hematology

## 2018-12-05 VITALS — BP 157/84 | HR 62 | Temp 99.1°F | Resp 20 | Ht 68.0 in | Wt 221.7 lb

## 2018-12-05 DIAGNOSIS — M069 Rheumatoid arthritis, unspecified: Secondary | ICD-10-CM

## 2018-12-05 DIAGNOSIS — M6281 Muscle weakness (generalized): Secondary | ICD-10-CM | POA: Diagnosis not present

## 2018-12-05 DIAGNOSIS — Z853 Personal history of malignant neoplasm of breast: Secondary | ICD-10-CM | POA: Insufficient documentation

## 2018-12-05 DIAGNOSIS — M47816 Spondylosis without myelopathy or radiculopathy, lumbar region: Secondary | ICD-10-CM

## 2018-12-05 DIAGNOSIS — C50412 Malignant neoplasm of upper-outer quadrant of left female breast: Secondary | ICD-10-CM

## 2018-12-05 DIAGNOSIS — M5137 Other intervertebral disc degeneration, lumbosacral region: Secondary | ICD-10-CM | POA: Diagnosis not present

## 2018-12-05 DIAGNOSIS — R700 Elevated erythrocyte sedimentation rate: Secondary | ICD-10-CM | POA: Diagnosis not present

## 2018-12-05 DIAGNOSIS — D472 Monoclonal gammopathy: Secondary | ICD-10-CM | POA: Diagnosis not present

## 2018-12-05 DIAGNOSIS — I10 Essential (primary) hypertension: Secondary | ICD-10-CM | POA: Diagnosis not present

## 2018-12-05 DIAGNOSIS — G629 Polyneuropathy, unspecified: Secondary | ICD-10-CM

## 2018-12-05 DIAGNOSIS — M0579 Rheumatoid arthritis with rheumatoid factor of multiple sites without organ or systems involvement: Secondary | ICD-10-CM | POA: Diagnosis not present

## 2018-12-05 DIAGNOSIS — M810 Age-related osteoporosis without current pathological fracture: Secondary | ICD-10-CM

## 2018-12-05 DIAGNOSIS — M8580 Other specified disorders of bone density and structure, unspecified site: Secondary | ICD-10-CM | POA: Diagnosis not present

## 2018-12-05 DIAGNOSIS — C50411 Malignant neoplasm of upper-outer quadrant of right female breast: Secondary | ICD-10-CM

## 2018-12-05 DIAGNOSIS — D649 Anemia, unspecified: Secondary | ICD-10-CM | POA: Insufficient documentation

## 2018-12-05 DIAGNOSIS — Z79899 Other long term (current) drug therapy: Secondary | ICD-10-CM | POA: Diagnosis not present

## 2018-12-05 DIAGNOSIS — M130 Polyarthritis, unspecified: Secondary | ICD-10-CM | POA: Diagnosis not present

## 2018-12-05 DIAGNOSIS — M4800 Spinal stenosis, site unspecified: Secondary | ICD-10-CM | POA: Diagnosis not present

## 2018-12-05 DIAGNOSIS — M5030 Other cervical disc degeneration, unspecified cervical region: Secondary | ICD-10-CM | POA: Diagnosis not present

## 2018-12-05 DIAGNOSIS — M549 Dorsalgia, unspecified: Secondary | ICD-10-CM | POA: Diagnosis not present

## 2018-12-05 LAB — CBC WITH DIFFERENTIAL/PLATELET
Abs Immature Granulocytes: 0.01 10*3/uL (ref 0.00–0.07)
Basophils Absolute: 0.1 10*3/uL (ref 0.0–0.1)
Basophils Relative: 1 %
Eosinophils Absolute: 0.2 10*3/uL (ref 0.0–0.5)
Eosinophils Relative: 4 %
HCT: 36.5 % (ref 36.0–46.0)
Hemoglobin: 11.3 g/dL — ABNORMAL LOW (ref 12.0–15.0)
Immature Granulocytes: 0 %
Lymphocytes Relative: 19 %
Lymphs Abs: 0.8 10*3/uL (ref 0.7–4.0)
MCH: 27.4 pg (ref 26.0–34.0)
MCHC: 31 g/dL (ref 30.0–36.0)
MCV: 88.4 fL (ref 80.0–100.0)
Monocytes Absolute: 0.4 10*3/uL (ref 0.1–1.0)
Monocytes Relative: 10 %
Neutro Abs: 2.8 10*3/uL (ref 1.7–7.7)
Neutrophils Relative %: 66 %
Platelets: 201 10*3/uL (ref 150–400)
RBC: 4.13 MIL/uL (ref 3.87–5.11)
RDW: 15.6 % — ABNORMAL HIGH (ref 11.5–15.5)
WBC: 4.3 10*3/uL (ref 4.0–10.5)
nRBC: 0 % (ref 0.0–0.2)

## 2018-12-05 LAB — COMPREHENSIVE METABOLIC PANEL
ALT: 13 U/L (ref 0–44)
AST: 22 U/L (ref 15–41)
Albumin: 3.7 g/dL (ref 3.5–5.0)
Alkaline Phosphatase: 82 U/L (ref 38–126)
Anion gap: 7 (ref 5–15)
BUN: 24 mg/dL — ABNORMAL HIGH (ref 8–23)
CO2: 30 mmol/L (ref 22–32)
Calcium: 9.2 mg/dL (ref 8.9–10.3)
Chloride: 108 mmol/L (ref 98–111)
Creatinine, Ser: 1.1 mg/dL — ABNORMAL HIGH (ref 0.44–1.00)
GFR calc Af Amer: 53 mL/min — ABNORMAL LOW (ref >60)
GFR calc non Af Amer: 46 mL/min — ABNORMAL LOW (ref >60)
Glucose, Bld: 88 mg/dL (ref 70–99)
Potassium: 4.2 mmol/L (ref 3.5–5.1)
Sodium: 145 mmol/L (ref 135–145)
Total Bilirubin: 0.6 mg/dL (ref 0.3–1.2)
Total Protein: 7.2 g/dL (ref 6.5–8.1)

## 2018-12-05 NOTE — Telephone Encounter (Signed)
Scheduled appt per 7/13 los. °Printed and mailed appt calendar. °

## 2018-12-06 ENCOUNTER — Other Ambulatory Visit: Payer: Self-pay | Admitting: *Deleted

## 2018-12-06 DIAGNOSIS — Z20822 Contact with and (suspected) exposure to covid-19: Secondary | ICD-10-CM

## 2018-12-06 LAB — KAPPA/LAMBDA LIGHT CHAINS
Kappa free light chain: 44.1 mg/L — ABNORMAL HIGH (ref 3.3–19.4)
Kappa, lambda light chain ratio: 3.61 — ABNORMAL HIGH (ref 0.26–1.65)
Lambda free light chains: 12.2 mg/L (ref 5.7–26.3)

## 2018-12-06 LAB — MULTIPLE MYELOMA PANEL, SERUM
Albumin SerPl Elph-Mcnc: 3.8 g/dL (ref 2.9–4.4)
Albumin/Glob SerPl: 1.4 (ref 0.7–1.7)
Alpha 1: 0.2 g/dL (ref 0.0–0.4)
Alpha2 Glob SerPl Elph-Mcnc: 0.5 g/dL (ref 0.4–1.0)
B-Globulin SerPl Elph-Mcnc: 1.5 g/dL — ABNORMAL HIGH (ref 0.7–1.3)
Gamma Glob SerPl Elph-Mcnc: 0.6 g/dL (ref 0.4–1.8)
Globulin, Total: 2.8 g/dL (ref 2.2–3.9)
IgA: 949 mg/dL — ABNORMAL HIGH (ref 64–422)
IgG (Immunoglobin G), Serum: 848 mg/dL (ref 586–1602)
IgM (Immunoglobulin M), Srm: 63 mg/dL (ref 26–217)
M Protein SerPl Elph-Mcnc: 0.7 g/dL — ABNORMAL HIGH
Total Protein ELP: 6.6 g/dL (ref 6.0–8.5)

## 2018-12-07 DIAGNOSIS — R6889 Other general symptoms and signs: Secondary | ICD-10-CM | POA: Diagnosis not present

## 2018-12-09 ENCOUNTER — Telehealth: Payer: Self-pay

## 2018-12-09 NOTE — Telephone Encounter (Signed)
-----   Message from Truitt Merle, MD sent at 12/07/2018 11:20 PM EDT ----- Please let pt know her M-protein and light chain levels are stable, no concerns. Thanks   Truitt Merle  12/07/2018

## 2018-12-09 NOTE — Telephone Encounter (Signed)
Spoke with patient regarding lab results.  Per Dr. Burr Medico M-protein and light chain levels are stable, no concerns.  Patient verbalized an understanding.

## 2018-12-11 LAB — NOVEL CORONAVIRUS, NAA: SARS-CoV-2, NAA: NOT DETECTED

## 2018-12-13 ENCOUNTER — Encounter (HOSPITAL_COMMUNITY): Payer: Self-pay

## 2018-12-13 ENCOUNTER — Other Ambulatory Visit (HOSPITAL_COMMUNITY): Payer: Medicare Other

## 2018-12-13 ENCOUNTER — Observation Stay (HOSPITAL_COMMUNITY)
Admission: EM | Admit: 2018-12-13 | Discharge: 2018-12-14 | Disposition: A | Discharge status: Home / Self Care | Payer: Medicare Other | Attending: Internal Medicine | Admitting: Internal Medicine

## 2018-12-13 ENCOUNTER — Emergency Department (HOSPITAL_COMMUNITY): Payer: Medicare Other

## 2018-12-13 ENCOUNTER — Other Ambulatory Visit: Payer: Self-pay

## 2018-12-13 DIAGNOSIS — F329 Major depressive disorder, single episode, unspecified: Secondary | ICD-10-CM | POA: Diagnosis not present

## 2018-12-13 DIAGNOSIS — Z79899 Other long term (current) drug therapy: Secondary | ICD-10-CM | POA: Insufficient documentation

## 2018-12-13 DIAGNOSIS — F419 Anxiety disorder, unspecified: Secondary | ICD-10-CM | POA: Insufficient documentation

## 2018-12-13 DIAGNOSIS — E785 Hyperlipidemia, unspecified: Secondary | ICD-10-CM | POA: Diagnosis not present

## 2018-12-13 DIAGNOSIS — G4700 Insomnia, unspecified: Secondary | ICD-10-CM | POA: Insufficient documentation

## 2018-12-13 DIAGNOSIS — R55 Syncope and collapse: Secondary | ICD-10-CM

## 2018-12-13 DIAGNOSIS — M48061 Spinal stenosis, lumbar region without neurogenic claudication: Secondary | ICD-10-CM | POA: Insufficient documentation

## 2018-12-13 DIAGNOSIS — Z1159 Encounter for screening for other viral diseases: Secondary | ICD-10-CM | POA: Insufficient documentation

## 2018-12-13 DIAGNOSIS — K219 Gastro-esophageal reflux disease without esophagitis: Secondary | ICD-10-CM | POA: Insufficient documentation

## 2018-12-13 DIAGNOSIS — R531 Weakness: Principal | ICD-10-CM

## 2018-12-13 DIAGNOSIS — Z981 Arthrodesis status: Secondary | ICD-10-CM | POA: Insufficient documentation

## 2018-12-13 DIAGNOSIS — E1140 Type 2 diabetes mellitus with diabetic neuropathy, unspecified: Secondary | ICD-10-CM | POA: Diagnosis not present

## 2018-12-13 DIAGNOSIS — M797 Fibromyalgia: Secondary | ICD-10-CM | POA: Diagnosis not present

## 2018-12-13 DIAGNOSIS — R5381 Other malaise: Secondary | ICD-10-CM | POA: Diagnosis not present

## 2018-12-13 DIAGNOSIS — D472 Monoclonal gammopathy: Secondary | ICD-10-CM

## 2018-12-13 DIAGNOSIS — M1990 Unspecified osteoarthritis, unspecified site: Secondary | ICD-10-CM | POA: Diagnosis not present

## 2018-12-13 DIAGNOSIS — Z791 Long term (current) use of non-steroidal anti-inflammatories (NSAID): Secondary | ICD-10-CM | POA: Diagnosis not present

## 2018-12-13 DIAGNOSIS — G629 Polyneuropathy, unspecified: Secondary | ICD-10-CM

## 2018-12-13 DIAGNOSIS — R159 Full incontinence of feces: Secondary | ICD-10-CM | POA: Diagnosis not present

## 2018-12-13 DIAGNOSIS — Z7952 Long term (current) use of systemic steroids: Secondary | ICD-10-CM | POA: Diagnosis not present

## 2018-12-13 DIAGNOSIS — Z96651 Presence of right artificial knee joint: Secondary | ICD-10-CM | POA: Insufficient documentation

## 2018-12-13 DIAGNOSIS — H409 Unspecified glaucoma: Secondary | ICD-10-CM | POA: Diagnosis not present

## 2018-12-13 DIAGNOSIS — R7303 Prediabetes: Secondary | ICD-10-CM | POA: Insufficient documentation

## 2018-12-13 DIAGNOSIS — Z9221 Personal history of antineoplastic chemotherapy: Secondary | ICD-10-CM | POA: Diagnosis not present

## 2018-12-13 DIAGNOSIS — I10 Essential (primary) hypertension: Secondary | ICD-10-CM | POA: Diagnosis not present

## 2018-12-13 DIAGNOSIS — M2550 Pain in unspecified joint: Secondary | ICD-10-CM | POA: Diagnosis not present

## 2018-12-13 DIAGNOSIS — N3000 Acute cystitis without hematuria: Secondary | ICD-10-CM | POA: Diagnosis not present

## 2018-12-13 DIAGNOSIS — G609 Hereditary and idiopathic neuropathy, unspecified: Secondary | ICD-10-CM

## 2018-12-13 DIAGNOSIS — Z20828 Contact with and (suspected) exposure to other viral communicable diseases: Secondary | ICD-10-CM | POA: Diagnosis not present

## 2018-12-13 LAB — CBC
HCT: 40.2 % (ref 36.0–46.0)
Hemoglobin: 12.9 g/dL (ref 12.0–15.0)
MCH: 28.4 pg (ref 26.0–34.0)
MCHC: 32.1 g/dL (ref 30.0–36.0)
MCV: 88.5 fL (ref 80.0–100.0)
Platelets: 214 10*3/uL (ref 150–400)
RBC: 4.54 MIL/uL (ref 3.87–5.11)
RDW: 15 % (ref 11.5–15.5)
WBC: 5.6 10*3/uL (ref 4.0–10.5)
nRBC: 0 % (ref 0.0–0.2)

## 2018-12-13 LAB — URINALYSIS, ROUTINE W REFLEX MICROSCOPIC
Bilirubin Urine: NEGATIVE
Glucose, UA: NEGATIVE mg/dL
Hgb urine dipstick: NEGATIVE
Ketones, ur: NEGATIVE mg/dL
Leukocytes,Ua: NEGATIVE
Nitrite: NEGATIVE
Protein, ur: NEGATIVE mg/dL
Specific Gravity, Urine: 1.005 (ref 1.005–1.030)
pH: 7 (ref 5.0–8.0)

## 2018-12-13 LAB — COMPREHENSIVE METABOLIC PANEL
ALT: 19 U/L (ref 0–44)
AST: 28 U/L (ref 15–41)
Albumin: 4.3 g/dL (ref 3.5–5.0)
Alkaline Phosphatase: 79 U/L (ref 38–126)
Anion gap: 11 (ref 5–15)
BUN: 22 mg/dL (ref 8–23)
CO2: 27 mmol/L (ref 22–32)
Calcium: 9.7 mg/dL (ref 8.9–10.3)
Chloride: 103 mmol/L (ref 98–111)
Creatinine, Ser: 0.98 mg/dL (ref 0.44–1.00)
GFR calc Af Amer: >60 mL/min (ref >60)
GFR calc non Af Amer: 53 mL/min — ABNORMAL LOW (ref >60)
Glucose, Bld: 115 mg/dL — ABNORMAL HIGH (ref 70–99)
Potassium: 3.8 mmol/L (ref 3.5–5.1)
Sodium: 141 mmol/L (ref 135–145)
Total Bilirubin: 1.1 mg/dL (ref 0.3–1.2)
Total Protein: 8 g/dL (ref 6.5–8.1)

## 2018-12-13 LAB — GLUCOSE, CAPILLARY: Glucose-Capillary: 115 mg/dL — ABNORMAL HIGH (ref 70–99)

## 2018-12-13 LAB — SARS CORONAVIRUS 2 BY RT PCR (HOSPITAL ORDER, PERFORMED IN ~~LOC~~ HOSPITAL LAB): SARS Coronavirus 2: NEGATIVE

## 2018-12-13 LAB — TSH: TSH: 2.724 u[IU]/mL (ref 0.350–4.500)

## 2018-12-13 LAB — CBG MONITORING, ED
Glucose-Capillary: 89 mg/dL (ref 70–99)
Glucose-Capillary: 87 mg/dL (ref 70–99)

## 2018-12-13 MED ORDER — SODIUM CHLORIDE 0.9 % IV BOLUS (SEPSIS)
500.0000 mL | Freq: Once | INTRAVENOUS | Status: AC
  Administered 2018-12-13: 500 mL via INTRAVENOUS

## 2018-12-13 MED ORDER — CEPHALEXIN 500 MG PO CAPS: 500.0000 mg | ORAL_CAPSULE | Freq: Once | ORAL | Status: DC

## 2018-12-13 MED ORDER — INSULIN ASPART 100 UNIT/ML ~~LOC~~ SOLN
0.0000 [IU] | Freq: Three times a day (TID) | SUBCUTANEOUS | Status: DC
Start: 2018-12-13 — End: 2018-12-14
  Filled 2018-12-13: qty 0.15

## 2018-12-13 MED ORDER — AMLODIPINE BESYLATE 10 MG PO TABS
10.0000 mg | ORAL_TABLET | ORAL | Status: DC
  Administered 2018-12-13 – 2018-12-14 (×2): 10 mg via ORAL
  Filled 2018-12-13: qty 1
  Filled 2018-12-13: qty 2

## 2018-12-13 MED ORDER — HYDROCODONE-ACETAMINOPHEN 5-325 MG PO TABS: 1.0000 | ORAL_TABLET | ORAL | Status: DC | PRN

## 2018-12-13 MED ORDER — SODIUM CHLORIDE 0.9 % IV SOLN: 1000.0000 mL | INTRAVENOUS | Status: DC

## 2018-12-13 MED ORDER — VALSARTAN-HYDROCHLOROTHIAZIDE 320-12.5 MG PO TABS: 1.0000 | ORAL_TABLET | Freq: Every day | ORAL | Status: DC

## 2018-12-13 MED ORDER — ACETAMINOPHEN 325 MG PO TABS: 650.0000 mg | ORAL_TABLET | Freq: Four times a day (QID) | ORAL | Status: DC | PRN

## 2018-12-13 MED ORDER — ACETAMINOPHEN 650 MG RE SUPP: 650.0000 mg | Freq: Four times a day (QID) | RECTAL | Status: DC | PRN

## 2018-12-13 MED ORDER — PRAVASTATIN SODIUM 20 MG PO TABS: 40.0000 mg | ORAL_TABLET | Freq: Every day | ORAL | Status: DC

## 2018-12-13 MED ORDER — PREGABALIN 25 MG PO CAPS
25.0000 mg | ORAL_CAPSULE | Freq: Three times a day (TID) | ORAL | Status: DC
  Administered 2018-12-13 – 2018-12-14 (×3): 25 mg via ORAL
  Filled 2018-12-13 (×3): qty 1

## 2018-12-13 MED ORDER — INSULIN ASPART 100 UNIT/ML ~~LOC~~ SOLN
0.0000 [IU] | Freq: Every day | SUBCUTANEOUS | Status: DC
  Filled 2018-12-13: qty 0.05

## 2018-12-13 MED ORDER — METOPROLOL SUCCINATE ER 50 MG PO TB24
50.0000 mg | ORAL_TABLET | Freq: Every evening | ORAL | Status: DC
  Administered 2018-12-13: 50 mg via ORAL
  Filled 2018-12-13: qty 1

## 2018-12-13 MED ORDER — ENOXAPARIN SODIUM 40 MG/0.4ML ~~LOC~~ SOLN
40.0000 mg | SUBCUTANEOUS | Status: DC
  Administered 2018-12-13: 40 mg via SUBCUTANEOUS
  Filled 2018-12-13: qty 0.4

## 2018-12-13 MED ORDER — POTASSIUM CHLORIDE IN NACL 20-0.9 MEQ/L-% IV SOLN
INTRAVENOUS | Status: DC
  Administered 2018-12-13 – 2018-12-14 (×3): via INTRAVENOUS
  Filled 2018-12-13 (×3): qty 1000

## 2018-12-13 MED ORDER — FOLIC ACID 1 MG PO TABS
2.0000 mg | ORAL_TABLET | Freq: Every morning | ORAL | Status: DC
  Administered 2018-12-14: 2 mg via ORAL
  Filled 2018-12-13: qty 2

## 2018-12-13 MED ORDER — HYDRALAZINE HCL 20 MG/ML IJ SOLN: 10.0000 mg | Freq: Four times a day (QID) | INTRAMUSCULAR | Status: DC | PRN

## 2018-12-13 MED ORDER — IRBESARTAN 300 MG PO TABS
300.0000 mg | ORAL_TABLET | Freq: Every day | ORAL | Status: DC
  Administered 2018-12-13 – 2018-12-14 (×2): 300 mg via ORAL
  Filled 2018-12-13 (×2): qty 1

## 2018-12-13 MED ORDER — HYDROCHLOROTHIAZIDE 12.5 MG PO CAPS
12.5000 mg | ORAL_CAPSULE | Freq: Every day | ORAL | Status: DC
  Administered 2018-12-13 – 2018-12-14 (×2): 12.5 mg via ORAL
  Filled 2018-12-13 (×2): qty 1

## 2018-12-13 NOTE — ED Notes (Signed)
Patient ambulated to restroom with standby assist.  

## 2018-12-13 NOTE — ED Notes (Signed)
ED TO INPATIENT HANDOFF REPORT  ED Nurse Name and Phone #: Judye Bos Name/Age/Gender Julie [name] 85 y.o. female Room/Bed: WA21/WA21  Code Status   Code Status: DNR  Home/SNF/Other Home Patient oriented to: self, place, time and situation Is this baseline? Yes   Triage Complete: Triage complete  Chief Complaint Weakness  Triage Note Pt BIBA from home. Pt c/o generalized weakness and "not feeling well" x2 days. Pt has been unable to sleep. Pt has hx of fibro. Pt has been using hemp lotion and thinks that caused HTN.   Left arm restricted.   Allergies Allergies  Allergen Reactions  . Codeine Shortness Of Breath  . Other     Walnuts-anaphylaxis (patient denies allergy states that she just does not eat [employer])  . Aspirin Other (See Comments)    Duodenal ulcer- cannot take aspirin when active.  HYPERSENSITIVE  TO  REGULAR DOSE.      Level of Care/Admitting Diagnosis ED Disposition    ED Disposition Condition Comment   Admit  Hospital Area: [facility] [[id number]]  Level of Care: Med-Surg [16]  Covid Evaluation: Confirmed COVID Negative  Diagnosis: Generalized weakness [867619]  Admitting Physician: Darliss Cheney [[id number]]  Attending Physician: Darliss Cheney [[id number]]  PT Class (Do Not Modify): Observation [104]  PT Acc Code (Do Not Modify): Observation [10022]       B Medical/Surgery History Past Medical History:  Diagnosis Date  . Anemia   . Anxiety    takes Klonopin at bedtime  . Arthritis    takes Methotrexate weekly and Prednisone  . Blood transfusion    no abnormal reaction noted  . Cancer (HCC)    breast   . Cataracts, bilateral    immature  . Complication of anesthesia    wakes up during surgery  . Depression   . Diabetes mellitus without complication ([employer])   . Dyspnea    with exertion since chemo   . Fibromyalgia    takes Lyrica daily  . GERD (gastroesophageal reflux disease)    takes Nexium daily  . Glaucoma   .  Hard of hearing   . Heart murmur   . Histoplasmosis   . History of colon polyps   . History of gout   . History of hiatal hernia   . History of shingles   . Hyperlipidemia   . Hypertension    takes Amlodipine,Metoprolol,and Losartan daily  . Incontinence of bowel   . Interstitial cystitis   . Joint pain   . Low back pain   . Nocturia   . Peripheral neuropathy   . Peripheral neuropathy   . Personal history of chemotherapy   . Personal history of radiation therapy   . PONV (postoperative nausea and vomiting)   . Ulcer 1970   duodenal  . Urinary frequency   . Urinary urgency   . Weakness    numbness in both hands and feet   Past Surgical History:  Procedure Laterality Date  . ABDOMINAL HYSTERECTOMY    . accupuncture  3 yrs ago  . ANKLE FUSION Left 05/10/2014   Procedure: LEFT ANKLE ARTHRODESIS ;  Surgeon: Wylene Simmer, MD;  Location: [place];  Service: Orthopedics;  Laterality: Left;  . ANKLE SURGERY Left   . arm surgery Left    radius  . bladder tacked     . BREAST LUMPECTOMY Left   . CERVICAL FUSION    . CHOLECYSTECTOMY    . COLONOSCOPY    . CYSTOCELE  REPAIR    . DILATION AND CURETTAGE OF UTERUS    . ESOPHAGOGASTRODUODENOSCOPY    . PORT A CATH REVISION N/A 12/27/2015   Procedure: PORT A CATH REVISION;  Surgeon: Rolm Bookbinder, MD;  Location: [place];  Service: General;  Laterality: N/A;  . PORT-A-CATH REMOVAL N/A 03/25/2016   Procedure: REMOVAL PORT-A-CATH;  Surgeon: Rolm Bookbinder, MD;  Location: [facility];  Service: General;  Laterality: N/A;  . PORTACATH PLACEMENT Right 12/27/2015   Procedure: INSERTION PORT-A-CATH WITH ULTRASOUND ;  Surgeon: Rolm Bookbinder, MD;  Location: [employer];  Service: General;  Laterality: Right;  . RADIOACTIVE SEED GUIDED PARTIAL MASTECTOMY WITH AXILLARY SENTINEL LYMPH NODE BIOPSY Left 11/14/2015   Procedure: RADIOACTIVE SEED GUIDED LEFT BREAST LUMPECTOMY WITH AXILLARY SENTINEL LYMPH NODE BIOPSY;  Surgeon:  Rolm Bookbinder, MD;  Location: [place];  Service: General;  Laterality: Left;  RADIOACTIVE SEED GUIDED LEFT BREAST LUMPECTOMY WITH AXILLARY SENTINEL LYMPH NODE BIOPSY  . SALPINGOOPHORECTOMY Bilateral   . TOTAL KNEE ARTHROPLASTY Right 02/28/2015   Procedure: RIGHT TOTAL KNEE ARTHROPLASTY;  Surgeon: Rod Can, MD;  Location: [facility];  Service: Orthopedics;  Laterality: Right;  . UPPER GASTROINTESTINAL ENDOSCOPY    . VAGINAL HYSTERECTOMY       A IV Location/Drains/Wounds Patient Lines/Drains/Airways Status   Active Line/Drains/Airways    Name:   Placement date:   Placement time:   Site:   Days:   Peripheral IV 12/13/18 Right Antecubital   12/13/18    0923    Antecubital   less than 1          Intake/Output Last 24 hours  Intake/Output Summary (Last 24 hours) at 12/13/2018 2135 Last data filed at 12/13/2018 1107 Gross per 24 hour  Intake 500 ml  Output -  Net 500 ml    Labs/Imaging Results for orders placed or performed during the hospital encounter of 12/13/18 (from the past 48 hour(s))  SARS Coronavirus 2 (CEPHEID - Performed in [place] hospital lab), Hosp Order     Status: None   Collection Time: 12/13/18  8:25 AM   Specimen: Nasopharyngeal Swab  Result Value Ref Range   SARS Coronavirus 2 NEGATIVE NEGATIVE    Comment: (NOTE) If result is NEGATIVE SARS-CoV-2 target nucleic acids are NOT DETECTED. The SARS-CoV-2 RNA is generally detectable in upper and lower  respiratory specimens during the acute phase of infection. The lowest  concentration of SARS-CoV-2 viral copies this assay can detect is 250  copies / mL. A negative result does not preclude SARS-CoV-2 infection  and should not be used as the sole basis for treatment or other  patient management decisions.  A negative result may occur with  improper specimen collection / handling, submission of specimen other  than nasopharyngeal swab, presence of viral mutation(s) within the  areas targeted  by this assay, and inadequate number of viral copies  (<250 copies / mL). A negative result must be combined with clinical  observations, patient history, and epidemiological information. If result is POSITIVE SARS-CoV-2 target nucleic acids are DETECTED. The SARS-CoV-2 RNA is generally detectable in upper and lower  respiratory specimens dur ing the acute phase of infection.  Positive  results are indicative of active infection with SARS-CoV-2.  Clinical  correlation with patient history and other diagnostic information is  necessary to determine patient infection status.  Positive results do  not rule out bacterial infection or co-infection with other viruses. If result is PRESUMPTIVE POSTIVE SARS-CoV-2 nucleic acids  MAY BE PRESENT.   A presumptive positive result was obtained on the submitted specimen  and confirmed on repeat testing.  While 2019 novel coronavirus  (SARS-CoV-2) nucleic acids may be present in the submitted sample  additional confirmatory testing may be necessary for epidemiological  and / or clinical management purposes  to differentiate between  SARS-CoV-2 and other Sarbecovirus currently known to infect humans.  If clinically indicated additional testing with an alternate test  methodology [phone]) is advised. The SARS-CoV-2 RNA is generally  detectable in upper and lower respiratory sp ecimens during the acute  phase of infection. The expected result is Negative. Fact Sheet for Patients:  StrictlyIdeas.no Fact Sheet for Healthcare Providers: BankingDealers.co.za This test is not yet approved or cleared by the [place] FDA and has been authorized for detection and/or diagnosis of SARS-CoV-2 by FDA under an Emergency Use Authorization (EUA).  This EUA will remain in effect (meaning this test can be used) for the duration of the COVID-19 declaration under Section 564(b)(1) of the Act, 21 U.S.C. section  360bbb-3(b)(1), unless the authorization is terminated or revoked sooner. Performed at [facility], [place] [address] [place], [place] [address]   CBC     Status: None   Collection Time: 12/13/18  9:20 AM  Result Value Ref Range   WBC 5.6 4.0 - 10.5 K/uL   RBC 4.54 3.87 - 5.11 MIL/uL   Hemoglobin 12.9 12.0 - 15.0 g/dL   HCT 40.2 36.0 - 46.0 %   MCV 88.5 80.0 - 100.0 fL   MCH 28.4 26.0 - 34.0 pg   MCHC 32.1 30.0 - 36.0 g/dL   RDW 15.0 11.5 - 15.5 %   Platelets 214 150 - 400 K/uL   nRBC 0.0 0.0 - 0.2 %    Comment: Performed at [facility], [place] [address] [place], [place] [address]  Comprehensive metabolic panel     Status: Abnormal   Collection Time: 12/13/18  9:20 AM  Result Value Ref Range   Sodium 141 135 - 145 mmol/L   Potassium 3.8 3.5 - 5.1 mmol/L   Chloride 103 98 - 111 mmol/L   CO2 27 22 - 32 mmol/L   Glucose, Bld 115 (H) 70 - 99 mg/dL   BUN 22 8 - 23 mg/dL   Creatinine, Ser 0.98 0.44 - 1.00 mg/dL   Calcium 9.7 8.9 - 10.3 mg/dL   Total Protein 8.0 6.5 - 8.1 g/dL   Albumin 4.3 3.5 - 5.0 g/dL   AST 28 15 - 41 U/L   ALT 19 0 - 44 U/L   Alkaline Phosphatase 79 38 - 126 U/L   Total Bilirubin 1.1 0.3 - 1.2 mg/dL   GFR calc non Af Amer 53 (L) >60 mL/min   GFR calc Af Amer >60 >60 mL/min   Anion gap 11 5 - 15    Comment: Performed at [facility], [place] [address] [place], [place] [address]  TSH     Status: None   Collection Time: 12/13/18  9:20 AM  Result Value Ref Range   TSH 2.724 0.350 - 4.500 uIU/mL    Comment: Performed by a 3rd Generation assay with a functional sensitivity of <=0.01 uIU/mL. Performed at [facility], [place] [address], [place], [place] [address]   CBG monitoring, ED     Status: None   Collection Time: 12/13/18 12:47 PM  Result Value Ref Range   Glucose-Capillary 89 70 - 99 mg/dL  Urinalysis,  Routine w reflex microscopic     Status: Abnormal   Collection Time: 12/13/18   1:47 PM  Result Value Ref Range   Color, Urine STRAW (A) YELLOW   APPearance CLEAR CLEAR   Specific Gravity, Urine 1.005 1.005 - 1.030   pH 7.0 5.0 - 8.0   Glucose, UA NEGATIVE NEGATIVE mg/dL   Hgb urine dipstick NEGATIVE NEGATIVE   Bilirubin Urine NEGATIVE NEGATIVE   Ketones, ur NEGATIVE NEGATIVE mg/dL   Protein, ur NEGATIVE NEGATIVE mg/dL   Nitrite NEGATIVE NEGATIVE   Leukocytes,Ua NEGATIVE NEGATIVE    Comment: Performed at [place] [address], [place], [place] [address]  CBG monitoring, ED     Status: None   Collection Time: 12/13/18  6:15 PM  Result Value Ref Range   Glucose-Capillary 87 70 - 99 mg/dL   Dg Chest Portable 1 View  Result Date: 12/13/2018 CLINICAL DATA:  Generalized weakness and malaise for the past 2 days. Sleep disturbance. EXAM: PORTABLE CHEST 1 VIEW COMPARISON:  07/31/2018. FINDINGS: Stable enlarged cardiac silhouette and clear lungs with normal vascularity. Stable surgical clips in the region of the gastroesophageal junction. Thoracic spine degenerative changes. IMPRESSION: No acute abnormality. Stable cardiomegaly. Electronically Signed   By: Claudie Revering M.D.   On: 12/13/2018 09:25    Pending Labs Unresulted Labs (From admission, onward)    Start     Ordered   Signed and Held  CBC  (enoxaparin (LOVENOX)    CrCl >/= 30 ml/min)  Once,   R    Comments: Baseline for enoxaparin therapy IF NOT ALREADY DRAWN.  Notify MD if PLT < 100 K.    Signed and Held   Signed and Held  Creatinine, serum  (enoxaparin (LOVENOX)    CrCl >/= 30 ml/min)  Once,   R    Comments: Baseline for enoxaparin therapy IF NOT ALREADY DRAWN.    Signed and Held   Signed and Held  Creatinine, serum  (enoxaparin (LOVENOX)    CrCl >/= 30 ml/min)  Weekly,   R    Comments: while on enoxaparin therapy    Signed and Held   Signed and Held  Magnesium  Once,   R     Signed and Held   Signed and Held  Comprehensive metabolic panel  Tomorrow morning,   R     Signed and  Held   Signed and Held  CBC  Tomorrow morning,   R     Signed and Held          Vitals/Pain Today's Vitals   12/13/18 2027 12/13/18 2030 12/13/18 2044 12/13/18 2045  BP: (!) 166/123     Pulse: 60 68    Resp: 13 14    Temp:      TempSrc:      SpO2: 98% 98% 98%   Weight:      Height:      PainSc:    10-Worst pain ever    Isolation Precautions No active isolations  Medications Medications  amLODipine (NORVASC) tablet 10 mg (10 mg Oral Given 12/13/18 1232)  irbesartan (AVAPRO) tablet 300 mg (300 mg Oral Given 12/13/18 1232)    And  hydrochlorothiazide (MICROZIDE) capsule 12.5 mg (12.5 mg Oral Given 12/13/18 1232)  metoprolol succinate (TOPROL-XL) 24 hr tablet 50 mg (50 mg Oral Given 12/13/18 1817)  0.9 % NaCl with KCl 20 mEq/ L  infusion ( Intravenous New Bag/Given 12/13/18 1704)  HYDROcodone-acetaminophen (NORCO/VICODIN) 5-325 MG per tablet 1-2 tablet (  has no administration in time range)  insulin aspart (novoLOG) injection 0-15 Units (0 Units Subcutaneous Not Given 12/13/18 1832)  insulin aspart (novoLOG) injection 0-5 Units (has no administration in time range)  hydrALAZINE (APRESOLINE) injection 10 mg (has no administration in time range)  sodium chloride 0.9 % bolus 500 mL (0 mLs Intravenous Stopped 12/13/18 1107)    Mobility walks with device High fall risk   Focused Assessments NA   R Recommendations: See Admitting Provider Note  Report given to:   Additional Notes: NA

## 2018-12-13 NOTE — ED Notes (Addendum)
Collene Gobble, pt daughter, left a message to call her. Called daughter Feleica Fulmore at [phone] and let her know pt dx and waiting on floor bed. Daughter concerned that no one has called her since she dropped off her mother. Daughter is concerned that her mother is coded as a DNR. Daughter said this is not what the pt wants. Let her know that I will message the provider. Messaged the provider. [name] said to call her at any hour.

## 2018-12-13 NOTE — ED Notes (Signed)
Pt needs assistance to restroom. Tech uses Sarah sit-to-stand-lift when transport pat to restroom

## 2018-12-13 NOTE — ED Triage Notes (Signed)
Pt BIBA from home. Pt c/o generalized weakness and "not feeling well" x2 days. Pt has been unable to sleep. Pt has hx of fibro. Pt has been using hemp lotion and thinks that caused HTN.   Left arm restricted.

## 2018-12-13 NOTE — ED Notes (Signed)
Pt provided with meal tray, stated she wasn't hungry.  Pt encouraged to eat at least part of the tray to maintain adequate blood sugar level.

## 2018-12-13 NOTE — H&P (Addendum)
History and Physical    Julie [name] OQH:[id number] DOB: 12-Mar-1933 DOA: 12/13/2018  PCP: [name], DO  Patient coming from: Home  I have personally briefly reviewed patient's old medical records in [place]  Chief Complaint: Generalized body aches/insomnia  HPI: Julie [name] is a 85 y.o. female with medical history significant of anxiety, arthritis, fibromyalgia, lumbar spinal stenosis, hypertension, prediabetes and several other comorbidities admission below presented to ER with a complaint of generalized body aches and inability to sleep for last 2 days.  According to patient, she sees local rheumatologist Dr. Macky Lower for her lumbar spinal stenosis and fibromyalgia however she continues to have significant aches and pains in her body which has been worse since last 2 days to the point that she has not been able to sleep.  She normally uses cane to help her walking.  She has no other complaints such as chest pain, shortness of breath, fever, chills, sweating, nausea, vomiting, any problem with urination or with bowel movement.   ED Course: Upon arrival to the emergency department, she was hemodynamically stable.  Chest x-ray was done which was unremarkable.  CBC, CMP were unremarkable and she was tested negative for COVID-19.  The plan was to send her home after walking but right after she finished the walk, according to ED physician reportedly she lost consciousness for about a minute when she seemed to be having " staring gaze".  She recovered soon and was completely alert and oriented.  She does not recall part of that event.  Due to this, decision was made by the ER physician to admit the patient and hospital service was consulted for that.  Review of Systems: As per HPI otherwise negative.    Past Medical History:  Diagnosis Date  . Anemia   . Anxiety    takes Klonopin at bedtime  . Arthritis    takes Methotrexate weekly and Prednisone  . Blood transfusion    no  abnormal reaction noted  . Cancer (HCC)    breast   . Cataracts, bilateral    immature  . Complication of anesthesia    wakes up during surgery  . Depression   . Diabetes mellitus without complication ([place])   . Dyspnea    with exertion since chemo   . Fibromyalgia    takes Lyrica daily  . GERD (gastroesophageal reflux disease)    takes Nexium daily  . Glaucoma   . Hard of hearing   . Heart murmur   . Histoplasmosis   . History of colon polyps   . History of gout   . History of hiatal hernia   . History of shingles   . Hyperlipidemia   . Hypertension    takes Amlodipine,Metoprolol,and Losartan daily  . Incontinence of bowel   . Interstitial cystitis   . Joint pain   . Low back pain   . Nocturia   . Peripheral neuropathy   . Peripheral neuropathy   . Personal history of chemotherapy   . Personal history of radiation therapy   . PONV (postoperative nausea and vomiting)   . Ulcer 1970   duodenal  . Urinary frequency   . Urinary urgency   . Weakness    numbness in both hands and feet    Past Surgical History:  Procedure Laterality Date  . ABDOMINAL HYSTERECTOMY    . accupuncture  3 yrs ago  . ANKLE FUSION Left 05/10/2014   Procedure: LEFT ANKLE ARTHRODESIS ;  Surgeon:  Wylene Simmer, MD;  Location: [facility];  Service: Orthopedics;  Laterality: Left;  . ANKLE SURGERY Left   . arm surgery Left    radius  . bladder tacked     . BREAST LUMPECTOMY Left   . CERVICAL FUSION    . CHOLECYSTECTOMY    . COLONOSCOPY    . CYSTOCELE REPAIR    . DILATION AND CURETTAGE OF UTERUS    . ESOPHAGOGASTRODUODENOSCOPY    . PORT A CATH REVISION N/A 12/27/2015   Procedure: PORT A CATH REVISION;  Surgeon: Rolm Bookbinder, MD;  Location: [facility];  Service: General;  Laterality: N/A;  . PORT-A-CATH REMOVAL N/A 03/25/2016   Procedure: REMOVAL PORT-A-CATH;  Surgeon: Rolm Bookbinder, MD;  Location: [facility];  Service: General;  Laterality: N/A;  . PORTACATH PLACEMENT Right  12/27/2015   Procedure: INSERTION PORT-A-CATH WITH ULTRASOUND ;  Surgeon: Rolm Bookbinder, MD;  Location: [facility];  Service: General;  Laterality: Right;  . RADIOACTIVE SEED GUIDED PARTIAL MASTECTOMY WITH AXILLARY SENTINEL LYMPH NODE BIOPSY Left 11/14/2015   Procedure: RADIOACTIVE SEED GUIDED LEFT BREAST LUMPECTOMY WITH AXILLARY SENTINEL LYMPH NODE BIOPSY;  Surgeon: Rolm Bookbinder, MD;  Location: [facility];  Service: General;  Laterality: Left;  RADIOACTIVE SEED GUIDED LEFT BREAST LUMPECTOMY WITH AXILLARY SENTINEL LYMPH NODE BIOPSY  . SALPINGOOPHORECTOMY Bilateral   . TOTAL KNEE ARTHROPLASTY Right 02/28/2015   Procedure: RIGHT TOTAL KNEE ARTHROPLASTY;  Surgeon: Rod Can, MD;  Location[facility];  Service: Orthopedics;  Laterality: Right;  . UPPER GASTROINTESTINAL ENDOSCOPY    . VAGINAL HYSTERECTOMY       reports that she has never smoked. She has never used smokeless tobacco. She reports that she does not drink alcohol or use drugs.  Allergies  Allergen Reactions  . Codeine Shortness Of Breath  . Other     Walnuts-anaphylaxis (patient denies allergy states that she just does not eat WALNUTS)  . Aspirin Other (See Comments)    Duodenal ulcer- cannot take aspirin when active.  HYPERSENSITIVE  TO  REGULAR DOSE.      Family History  Problem Relation Age of Onset  . Colon cancer Sister 19  . Esophageal cancer Brother 32  . Breast cancer Mother   . Brain cancer Other     Prior to Admission medications   Medication Sig Start Date End Date Taking? Authorizing Provider  amLODipine (NORVASC) 10 MG tablet Take 10 mg by mouth every morning.    Yes [provider]  Biotin 10000 MCG TABS Take 10,000 mcg by mouth daily.    Yes [provider]  calcium-vitamin D (OSCAL WITH D) 500-200 MG-UNIT per tablet Take 1 tablet by mouth every morning.   Yes [provider]  fish oil-omega-3 fatty acids 1000 MG capsule Take 1 g by mouth every  morning.    Yes [provider]  folic acid (FOLVITE) 1 MG tablet Take 2 mg by mouth every morning.    Yes [provider]  Lidocaine 2 % GEL Apply 1 g topically as needed. Patient taking differently: Apply 1 g topically as needed (pain).  07/23/16  Yes Marcial Pacas, MD  lovastatin (MEVACOR) 40 MG tablet 1 (ONE) TABLET TAKE IN THE EVENING AFTER DINNER Patient taking differently: Take 40 mg by mouth every evening.  08/12/18  Yes Vyas, Sylvan Cheese, MD  meloxicam (MOBIC) 15 MG tablet Take 15 mg by mouth daily. 11/09/15  Yes [provider]  metoprolol succinate (TOPROL-XL) 50 MG 24  hr tablet Take 50 mg by mouth every evening.  08/29/15  Yes [provider]  pregabalin (LYRICA) 25 MG capsule Take 1 capsule (25 mg total) by mouth 3 (three) times daily. 03/13/18 12/13/18 Yes Duffy Bruce, MD  valsartan-hydrochlorothiazide (DIOVAN-HCT) 320-12.5 MG per tablet Take 1 tablet by mouth daily.   Yes [provider]  vitamin E 400 UNIT capsule Take 400 Units by mouth daily.   Yes [provider]  FREESTYLE LITE test strip TEST ONCE EVERY DAY 01/27/17   [provider]    Physical Exam: Vitals:   12/13/18 1130 12/13/18 1145 12/13/18 1200 12/13/18 1213  BP: (!) 152/76  (!) 153/79 (!) 182/93  Pulse: (!) 53 (!) 53 (!) 54 (!) 58  Resp: 11 10 10    Temp:      TempSrc:      SpO2: 99% 99% 99% 99%  Weight:      Height:        Constitutional: NAD, calm, comfortable Vitals:   12/13/18 1130 12/13/18 1145 12/13/18 1200 12/13/18 1213  BP: (!) 152/76  (!) 153/79 (!) 182/93  Pulse: (!) 53 (!) 53 (!) 54 (!) 58  Resp: 11 10 10    Temp:      TempSrc:      SpO2: 99% 99% 99% 99%  Weight:      Height:       Eyes: PERRL, lids and conjunctivae normal ENMT: Mucous membranes are moist. Posterior pharynx clear of any exudate or lesions.Normal dentition.  Neck: normal, supple, no masses, no thyromegaly Respiratory: clear to auscultation bilaterally, no wheezing, no  crackles. Normal respiratory effort. No accessory muscle use.  Cardiovascular: Regular rate and rhythm, no murmurs / rubs / gallops. No extremity edema. 2+ pedal pulses. No carotid bruits.  Abdomen: no tenderness, no masses palpated. No hepatosplenomegaly. Bowel sounds positive.  Musculoskeletal: no clubbing / cyanosis. No joint deformity upper and lower extremities. Good ROM, no contractures. Normal muscle tone.  Skin: no rashes, lesions, ulcers. No induration Neurologic: CN 2-12 grossly intact. Sensation intact, DTR normal. Strength 5/5 in all 4.  Psychiatric: Normal judgment and insight. Alert and oriented x 3. Normal mood.    Labs on Admission: I have personally reviewed following labs and imaging studies  CBC: Recent Labs  Lab 12/13/18 0920  WBC 5.6  HGB 12.9  HCT 40.2  MCV 88.5  PLT 811   Basic Metabolic Panel: Recent Labs  Lab 12/13/18 0920  NA 141  K 3.8  CL 103  CO2 27  GLUCOSE 115*  BUN 22  CREATININE 0.98  CALCIUM 9.7   GFR: Estimated Creatinine Clearance: 49 mL/min (by C-G formula based on SCr of 0.98 mg/dL). Liver Function Tests: Recent Labs  Lab 12/13/18 0920  AST 28  ALT 19  ALKPHOS 79  BILITOT 1.1  PROT 8.0  ALBUMIN 4.3   No results for input(s): LIPASE, AMYLASE in the last 168 hours. No results for input(s): AMMONIA in the last 168 hours. Coagulation Profile: No results for input(s): INR, PROTIME in the last 168 hours. Cardiac Enzymes: No results for input(s): CKTOTAL, CKMB, CKMBINDEX, TROPONINI in the last 168 hours. BNP (last 3 results) No results for input(s): PROBNP in the last 8760 hours. HbA1C: No results for input(s): HGBA1C in the last 72 hours. CBG: Recent Labs  Lab 12/13/18 1247  GLUCAP 89   Lipid Profile: No results for input(s): CHOL, HDL, LDLCALC, TRIG, CHOLHDL, LDLDIRECT in the last 72 hours. Thyroid Function Tests: No results for input(s):  TSH, T4TOTAL, FREET4, T3FREE, THYROIDAB in the last 72 hours. Anemia Panel: No  results for input(s): VITAMINB12, FOLATE, FERRITIN, TIBC, IRON, RETICCTPCT in the last 72 hours. Urine analysis:    Component Value Date/Time   COLORURINE STRAW (A) 07/31/2018 1339   APPEARANCEUR CLEAR 07/31/2018 1339   APPEARANCEUR Clear 06/08/2016 1154   LABSPEC 1.009 07/31/2018 1339   PHURINE 6.0 07/31/2018 1339   GLUCOSEU NEGATIVE 07/31/2018 1339   HGBUR NEGATIVE 07/31/2018 1339   BILIRUBINUR NEGATIVE 07/31/2018 1339   BILIRUBINUR Negative 06/08/2016 1154   KETONESUR NEGATIVE 07/31/2018 1339   PROTEINUR NEGATIVE 07/31/2018 1339   UROBILINOGEN 0.2 02/21/2015 0917   NITRITE NEGATIVE 07/31/2018 1339   LEUKOCYTESUR NEGATIVE 07/31/2018 1339    Radiological Exams on Admission: Dg Chest Portable 1 View  Result Date: 12/13/2018 CLINICAL DATA:  Generalized weakness and malaise for the past 2 days. Sleep disturbance. EXAM: PORTABLE CHEST 1 VIEW COMPARISON:  07/31/2018. FINDINGS: Stable enlarged cardiac silhouette and clear lungs with normal vascularity. Stable surgical clips in the region of the gastroesophageal junction. Thoracic spine degenerative changes. IMPRESSION: No acute abnormality. Stable cardiomegaly. Electronically Signed   By: Claudie Revering M.D.   On: 12/13/2018 09:25    EKG: Independently reviewed.  Sinus rhythm with no acute ST-T wave changes.  No change from previous EKG.  Assessment/Plan Active Problems:   MGUS (monoclonal gammopathy of unknown significance)   Peripheral neuropathy   Generalized weakness   Syncope    Generalized weakness/body aches: This is likely progression of her fibromyalgia, spinal stenosis and arthritis.  CBC and CMP is normal.  No signs of infection.  Chest x-ray normal.  UA pending.  Will consult PT OT to assess it.  ?  Syncope: Unsure whether this was an episode of syncope or not.  If this was, this is likely multifactorial, mainly due to her generalized body ache, weakness and insomnia for last 2 days which may have caused significant  weakness.  Admit for observation and monitor on telemetry and order transthoracic echo.  No signs of stroke or any focal neurological deficit.  Does not need CT head.  Hypertension: Upon initial arrival, her blood pressure was fine but it started to rise.  She was given her home dose of amlodipine.  Blood pressure still elevated.  Resume amlodipine and metoprolol and place her on PRN hydralazine.  Monitor closely.  Prediabetes: She is not on any antidiabetic medications.  Recent hemoglobin A1c falls in the range of prediabetes.  Will start on SSI.   DVT prophylaxis: Lovenox Code Status: DNR, confirmed with patient Family Communication: None present at bedside.  Patient alert, oriented and competent.  Discussed plan of care with the patient. Disposition Plan: Likely home in next 24 to 48 hours. Consults called: None.  Consulted PT OT Admission status: Observation   Darliss Cheney MD [facility] Pager [phone]  If 7PM-7AM, please contact night-coverage [email or web address] Password [email or web address]  12/13/2018, 1:24 PM

## 2018-12-13 NOTE — ED Provider Notes (Signed)
[facility] DEPT Provider Note   CSN: [id number] Arrival date & time: 12/13/18  8841    History   Chief Complaint Chief Complaint  Patient presents with  . Weakness    HPI Julie [name] is a 85 y.o. female.     HPI Pt states she has not slept in the last couple of days.  She has been having joint pain diffusely.  She has been trying some topical agents(hemp nirvana) and thinks it may have affected her sleep.  She started having some trouble with sweating at night and then this am she felt very fatigued.  SHe is still able to walk this am with her cane.  NO fevers or cough.  No vomiting or diarrhea.    NO  Pain with urination but she is urinating frequently the last couple of days.   No CP or discomfort.  No adbominal pain but she does have some burning in her upper abdomen.  No trouble with speech or difficulty moving arms or legs.  Past Medical History:  Diagnosis Date  . Anemia   . Anxiety    takes Klonopin at bedtime  . Arthritis    takes Methotrexate weekly and Prednisone  . Blood transfusion    no abnormal reaction noted  . Cancer (HCC)    breast   . Cataracts, bilateral    immature  . Complication of anesthesia    wakes up during surgery  . Depression   . Diabetes mellitus without complication ([facility] [place])   . Dyspnea    with exertion since chemo   . Fibromyalgia    takes Lyrica daily  . GERD (gastroesophageal reflux disease)    takes Nexium daily  . Glaucoma   . Hard of hearing   . Heart murmur   . Histoplasmosis   . History of colon polyps   . History of gout   . History of hiatal hernia   . History of shingles   . Hyperlipidemia   . Hypertension    takes Amlodipine,Metoprolol,and Losartan daily  . Incontinence of bowel   . Interstitial cystitis   . Joint pain   . Low back pain   . Nocturia   . Peripheral neuropathy   . Peripheral neuropathy   . Personal history of chemotherapy   . Personal history of radiation therapy    . PONV (postoperative nausea and vomiting)   . Ulcer 1970   duodenal  . Urinary frequency   . Urinary urgency   . Weakness    numbness in both hands and feet    Patient Active Problem List   Diagnosis Date Noted  . Peripheral neuropathy 06/08/2016  . Low back pain without sciatica 06/08/2016  . Abnormality of gait 06/08/2016  . Anemia due to antineoplastic chemotherapy 03/06/2016  . Hypersensitivity reaction 01/07/2016  . Port catheter in place 01/03/2016  . Malignant neoplasm of upper outer quadrant of female breast ([place]) 10/30/2015  . Primary osteoarthritis of right knee 02/28/2015  . Post-traumatic arthritis of ankle 05/10/2014  . MGUS (monoclonal gammopathy of unknown significance) 02/17/2012  . Diverticulosis of colon (without mention of hemorrhage) 01/21/2011  . Family history of malignant neoplasm of gastrointestinal tract 01/21/2011  . Special screening for malignant neoplasms, colon 01/21/2011  . Other symptoms involving digestive system(787.99) 01/21/2011    Past Surgical History:  Procedure Laterality Date  . ABDOMINAL HYSTERECTOMY    . accupuncture  3 yrs ago  . ANKLE FUSION Left 05/10/2014   Procedure:  LEFT ANKLE ARTHRODESIS ;  Surgeon: Wylene Simmer, MD;  Location: [facility];  Service: Orthopedics;  Laterality: Left;  . ANKLE SURGERY Left   . arm surgery Left    radius  . bladder tacked     . BREAST LUMPECTOMY Left   . CERVICAL FUSION    . CHOLECYSTECTOMY    . COLONOSCOPY    . CYSTOCELE REPAIR    . DILATION AND CURETTAGE OF UTERUS    . ESOPHAGOGASTRODUODENOSCOPY    . PORT A CATH REVISION N/A 12/27/2015   Procedure: PORT A CATH REVISION;  Surgeon: Rolm Bookbinder, MD;  Location: [facility];  Service: General;  Laterality: N/A;  . PORT-A-CATH REMOVAL N/A 03/25/2016   Procedure: REMOVAL PORT-A-CATH;  Surgeon: Rolm Bookbinder, MD;  [facility];  Service: General;  Laterality: N/A;  . PORTACATH PLACEMENT Right 12/27/2015   Procedure: INSERTION  PORT-A-CATH WITH ULTRASOUND ;  Surgeon: Rolm Bookbinder, MD;  Location: [facility];  Service: General;  Laterality: Right;  . RADIOACTIVE SEED GUIDED PARTIAL MASTECTOMY WITH AXILLARY SENTINEL LYMPH NODE BIOPSY Left 11/14/2015   Procedure: RADIOACTIVE SEED GUIDED LEFT BREAST LUMPECTOMY WITH AXILLARY SENTINEL LYMPH NODE BIOPSY;  Surgeon: Rolm Bookbinder, MD;  Location: [facility];  Service: General;  Laterality: Left;  RADIOACTIVE SEED GUIDED LEFT BREAST LUMPECTOMY WITH AXILLARY SENTINEL LYMPH NODE BIOPSY  . SALPINGOOPHORECTOMY Bilateral   . TOTAL KNEE ARTHROPLASTY Right 02/28/2015   Procedure: RIGHT TOTAL KNEE ARTHROPLASTY;  Surgeon: Rod Can, MD;  [facility];  Service: Orthopedics;  Laterality: Right;  . UPPER GASTROINTESTINAL ENDOSCOPY    . VAGINAL HYSTERECTOMY       OB History   No obstetric history on file.      Home Medications    Prior to Admission medications   Medication Sig Start Date End Date Taking? Authorizing Provider  amLODipine (NORVASC) 10 MG tablet Take 10 mg by mouth every morning.    Yes [provider]  Biotin 10000 MCG TABS Take 10,000 mcg by mouth daily.    Yes [provider]  calcium-vitamin D (OSCAL WITH D) 500-200 MG-UNIT per tablet Take 1 tablet by mouth every morning.   Yes [provider]  fish oil-omega-3 fatty acids 1000 MG capsule Take 1 g by mouth every morning.    Yes [provider]  folic acid (FOLVITE) 1 MG tablet Take 2 mg by mouth every morning.    Yes [provider]  Lidocaine 2 % GEL Apply 1 g topically as needed. Patient taking differently: Apply 1 g topically as needed (pain).  07/23/16  Yes Marcial Pacas, MD  lovastatin (MEVACOR) 40 MG tablet 1 (ONE) TABLET TAKE IN THE EVENING AFTER DINNER Patient taking differently: Take 40 mg by mouth every evening.  08/12/18  Yes Vyas, Sylvan Cheese, MD  meloxicam (MOBIC) 15 MG tablet Take 15 mg by mouth daily. 11/09/15  Yes  [provider]  metoprolol succinate (TOPROL-XL) 50 MG 24 hr tablet Take 50 mg by mouth every evening.  08/29/15  Yes [provider]  pregabalin (LYRICA) 25 MG capsule Take 1 capsule (25 mg total) by mouth 3 (three) times daily. 03/13/18 12/13/18 Yes Duffy Bruce, MD  valsartan-hydrochlorothiazide (DIOVAN-HCT) 320-12.5 MG per tablet Take 1 tablet by mouth daily.   Yes [provider]  vitamin E 400 UNIT capsule Take 400 Units by mouth daily.   Yes [provider]  FREESTYLE LITE test strip TEST ONCE EVERY DAY 01/27/17   [provider]    Family History Family History  Problem Relation Age of Onset  . Colon cancer Sister 34  . Esophageal cancer Brother 3  . Breast cancer Mother   . Brain cancer Other     Social History Social History   Tobacco Use  . Smoking status: Never Smoker  . Smokeless tobacco: Never Used  Substance Use Topics  . Alcohol use: No    Comment: IN [facility] ONLY  . Drug use: No     Allergies   Codeine, Other, and Aspirin   Review of Systems Review of Systems  All other systems reviewed and are negative.    Physical Exam Updated Vital Signs BP (!) 182/93   Pulse (!) 58   Temp 98.5 F (36.9 C) (Oral)   Resp 10   Ht 1.626 m (5\' 4" )   Wt 99.8 kg   SpO2 99%   BMI 37.76 kg/m   Physical Exam Vitals signs and nursing note reviewed.  Constitutional:      General: She is not in acute distress.    Appearance: She is well-developed.  HENT:     Head: Normocephalic and atraumatic.     Right Ear: External ear normal.     Left Ear: External ear normal.  Eyes:     General: No scleral icterus.       Right eye: No discharge.        Left eye: No discharge.     Conjunctiva/sclera: Conjunctivae normal.  Neck:     Musculoskeletal: Neck supple.     Trachea: No tracheal deviation.  Cardiovascular:     Rate and Rhythm: Normal rate and regular rhythm.  Pulmonary:     Effort: Pulmonary effort is normal. No  respiratory distress.     Breath sounds: Normal breath sounds. No stridor. No wheezing or rales.  Abdominal:     General: Bowel sounds are normal. There is no distension.     Palpations: Abdomen is soft.     Tenderness: There is no abdominal tenderness. There is no guarding or rebound.  Musculoskeletal:        General: No tenderness.  Skin:    General: Skin is warm and dry.     Findings: No rash.  Neurological:     General: No focal deficit present.     Mental Status: She is alert and oriented to person, place, and time.     Cranial Nerves: No cranial nerve deficit (no facial droop, extraocular movements intact, no slurred speech).     Sensory: No sensory deficit.     Motor: No abnormal muscle tone or seizure activity.     Coordination: Coordination normal.     Comments: 5/5 bilateral upper and lower extremity strength,       ED Treatments / Results  Labs (all labs ordered are listed, but only abnormal results are displayed) Labs Reviewed  COMPREHENSIVE METABOLIC PANEL - Abnormal; Notable for the following components:      Result Value   Glucose, Bld 115 (*)    GFR calc non Af Amer 53 (*)    All other components within normal limits  SARS CORONAVIRUS 2 (HOSPITAL ORDER, [facility])  CBC  URINALYSIS, ROUTINE W REFLEX MICROSCOPIC  CBG MONITORING, ED    EKG EKG Interpretation  Date/Time:  Tuesday December 13 2018 09:47:13 EDT Ventricular Rate:  54 PR Interval:    QRS Duration: 92 QT Interval:  500 QTC Calculation: 474 R Axis:   -  2 Text Interpretation:  Sinus rhythm Abnormal R-wave progression, early transition Borderline T wave abnormalities No significant change since last tracing Confirmed by Dorie Rank [phone]) on 12/13/2018 9:49:52 AM   Radiology Dg Chest Portable 1 View  Result Date: 12/13/2018 CLINICAL DATA:  Generalized weakness and malaise for the past 2 days. Sleep disturbance. EXAM: PORTABLE CHEST 1 VIEW COMPARISON:  07/31/2018.  FINDINGS: Stable enlarged cardiac silhouette and clear lungs with normal vascularity. Stable surgical clips in the region of the gastroesophageal junction. Thoracic spine degenerative changes. IMPRESSION: No acute abnormality. Stable cardiomegaly. Electronically Signed   By: Claudie Revering M.D.   On: 12/13/2018 09:25    Procedures Procedures (including critical care time)  Medications Ordered in ED Medications  sodium chloride 0.9 % bolus 500 mL (0 mLs Intravenous Stopped 12/13/18 1107)    Followed by  0.9 %  sodium chloride infusion (has no administration in time range)  amLODipine (NORVASC) tablet 10 mg (10 mg Oral Given 12/13/18 1232)  irbesartan (AVAPRO) tablet 300 mg (300 mg Oral Given 12/13/18 1232)    And  hydrochlorothiazide (MICROZIDE) capsule 12.5 mg (12.5 mg Oral Given 12/13/18 1232)  cephALEXin (KEFLEX) capsule 500 mg (has no administration in time range)     Initial Impression / Assessment and Plan / ED Course  I have reviewed the triage vital signs and the nursing notes.  Pertinent labs & imaging results that were available during my care of the patient were reviewed by me and considered in my medical decision making (see chart for details).  Clinical Course as of Dec 12 1256  Tue Dec 13, 2018  1014 No signs of pneumonia  DG Chest Portable 1 View [JK]  1014 Normal  CBC [JK]  1015 No significant abnormalities  Comprehensive metabolic panel(!) [JK]  5621 Notified to update daughter ingrid once results are back   [JK]  1256 Pt walked to the bathroom.  After that she had an episode of unresponsiveness.  Not answering questions.  No shaking.  Sx resolved at this point.  Pt is awake, answering questions.  No focal deficits   [JK]    Clinical Course User Index [JK] Dorie Rank, MD     Pt presents with general weakness.  Blood tests are reassuring.    Patient was initially normotensive.  She did not take her blood pressure medications this morning prior to arrival and her  blood pressure has now increased here in the emergency room.  Blood pressure meds were given.  While she was in the ED however she had an event where she became briefly unresponsive.  Sounds more like near syncopal episode less likely TIA.  Will admit for observation, further treatment.  Final Clinical Impressions(s) / ED Diagnoses   Final diagnoses:  Acute cystitis without hematuria  Weakness  Near syncope      Dorie Rank, MD 12/13/18 1304

## 2018-12-13 NOTE — ED Notes (Signed)
Pt called out for assistance by call bell.  Upon arrival in room the pt was struggling to sit up, unable to talk was starring.  She was not able to direct herself to give attention to my call.

## 2018-12-13 NOTE — ED Notes (Addendum)
Pt requested I call her daughter to discuss her medical care. Told her I would call ASAP. Pt said, her daughter wants to remove her DNR code status.

## 2018-12-14 ENCOUNTER — Observation Stay (HOSPITAL_BASED_OUTPATIENT_CLINIC_OR_DEPARTMENT_OTHER): Payer: Medicare Other

## 2018-12-14 DIAGNOSIS — R55 Syncope and collapse: Secondary | ICD-10-CM | POA: Diagnosis not present

## 2018-12-14 DIAGNOSIS — R531 Weakness: Secondary | ICD-10-CM | POA: Diagnosis not present

## 2018-12-14 DIAGNOSIS — I10 Essential (primary) hypertension: Secondary | ICD-10-CM | POA: Diagnosis not present

## 2018-12-14 LAB — CBC
HCT: 36.5 % (ref 36.0–46.0)
Hemoglobin: 11.4 g/dL — ABNORMAL LOW (ref 12.0–15.0)
MCH: 28 pg (ref 26.0–34.0)
MCHC: 31.2 g/dL (ref 30.0–36.0)
MCV: 89.7 fL (ref 80.0–100.0)
Platelets: 194 10*3/uL (ref 150–400)
RBC: 4.07 MIL/uL (ref 3.87–5.11)
RDW: 15 % (ref 11.5–15.5)
WBC: 5.1 10*3/uL (ref 4.0–10.5)
nRBC: 0 % (ref 0.0–0.2)

## 2018-12-14 LAB — COMPREHENSIVE METABOLIC PANEL
ALT: 15 U/L (ref 0–44)
AST: 23 U/L (ref 15–41)
Albumin: 3.5 g/dL (ref 3.5–5.0)
Alkaline Phosphatase: 66 U/L (ref 38–126)
Anion gap: 9 (ref 5–15)
BUN: 17 mg/dL (ref 8–23)
CO2: 24 mmol/L (ref 22–32)
Calcium: 8.8 mg/dL — ABNORMAL LOW (ref 8.9–10.3)
Chloride: 108 mmol/L (ref 98–111)
Creatinine, Ser: 0.78 mg/dL (ref 0.44–1.00)
GFR calc Af Amer: >60 mL/min (ref >60)
GFR calc non Af Amer: >60 mL/min (ref >60)
Glucose, Bld: 98 mg/dL (ref 70–99)
Potassium: 3.7 mmol/L (ref 3.5–5.1)
Sodium: 141 mmol/L (ref 135–145)
Total Bilirubin: 0.8 mg/dL (ref 0.3–1.2)
Total Protein: 6.6 g/dL (ref 6.5–8.1)

## 2018-12-14 LAB — GLUCOSE, CAPILLARY
Glucose-Capillary: 94 mg/dL (ref 70–99)
Glucose-Capillary: 92 mg/dL (ref 70–99)
Glucose-Capillary: 92 mg/dL (ref 70–99)
Glucose-Capillary: 95 mg/dL (ref 70–99)

## 2018-12-14 MED ORDER — BISACODYL 10 MG RE SUPP
10.0000 mg | Freq: Once | RECTAL | Status: DC
  Filled 2018-12-14: qty 1

## 2018-12-14 MED ORDER — SORBITOL 70 % SOLN
30.0000 mL | Freq: Once | Status: DC
  Filled 2018-12-14: qty 30

## 2018-12-14 NOTE — Evaluation (Signed)
Physical Therapy Evaluation Patient Details Name: Julie [name] MRN: [id number] DOB: 1932-06-23 Today's Date: 12/14/2018   History of Present Illness  85 year old female was admitted for generalized weakness.  PMH:  fibromyalgia, anxiety, arthritis, spinal stenosis, HTN, DM  Clinical Impression  Patient presents close to functional baseline able to mobilize safely with RW and has one at home.  Lives with daughter and no mobility concerns.  Will sign off as can walk with nursing.    Follow Up Recommendations No PT follow up    Equipment Recommendations  None recommended by PT    Recommendations for Other Services       Precautions / Restrictions Precautions Precautions: Fall Precaution Comments: uses RW vs cane to decrease fall risk      Mobility  Bed Mobility Overal bed mobility: Modified Independent                Transfers Overall transfer level: Modified independent Equipment used: None Transfers: Sit to/from Stand Sit to Stand: Modified independent (Device/Increase time)            Ambulation/Gait Ambulation/Gait assistance: Supervision Gait Distance (Feet): 400 Feet Assistive device: Rolling walker (2 wheeled) Gait Pattern/deviations: Step-through pattern;Decreased stride length     General Gait Details: no LOB, walker dragging, pt reports hers has 4 wheels; also walked to bathroom pushing IV pole  Stairs            Wheelchair Mobility    Modified Rankin (Stroke Patients Only)       Balance Overall balance assessment: Needs assistance   Sitting balance-Leahy Scale: Normal       Standing balance-Leahy Scale: Fair Standing balance comment: static balance without UE support, to ambulation furniture walks or uses RW                             Pertinent Vitals/Pain Faces Pain Scale: Hurts whole lot Pain Location: R hip/leg Pain Descriptors / Indicators: Sore Pain Intervention(s): Monitored during session;Repositioned     Home Living Family/patient expects to be discharged to:: Private residence Living Arrangements: Children Available Help at Discharge: Family Type of Home: House Home Access: Stairs to enter Entrance Stairs-Rails: Psychiatric nurse of Steps: 3 Home Layout: Two level Home Equipment: Mining engineer - 2 wheels      Prior Function Level of Independence: Independent with assistive device(s)         Comments: uses cane; has RWs     Hand Dominance        Extremity/Trunk Assessment        Lower Extremity Assessment Lower Extremity Assessment: Generalized weakness       Communication   Communication: No difficulties  Cognition Arousal/Alertness: Awake/alert Behavior During Therapy: WFL for tasks assessed/performed Overall Cognitive Status: Within Functional Limits for tasks assessed                                        General Comments      Exercises     Assessment/Plan    PT Assessment Patent does not need any further PT services  PT Problem List         PT Treatment Interventions      PT Goals (Current goals can be found in the Care Plan section)  Acute Rehab PT Goals PT Goal Formulation: All assessment and education complete,  DC therapy    Frequency     Barriers to discharge        Co-evaluation               AM-PAC PT "6 Clicks" Mobility  Outcome Measure Help needed turning from your back to your side while in a flat bed without using bedrails?: None Help needed moving from lying on your back to sitting on the side of a flat bed without using bedrails?: None Help needed moving to and from a bed to a chair (including a wheelchair)?: None Help needed standing up from a chair using your arms (e.g., wheelchair or bedside chair)?: None Help needed to walk in hospital room?: A Little Help needed climbing 3-5 steps with a railing? : A Little 6 Click Score: 22    End of Session Equipment Utilized  During Treatment: Gait belt Activity Tolerance: Patient tolerated treatment well Patient left: in bed;with call bell/phone within reach   PT Visit Diagnosis: Other abnormalities of gait and mobility (R26.89)    Time: 1330-1350 PT Time Calculation (min) (ACUTE ONLY): 20 min   Charges:   PT Evaluation $PT Eval Low Complexity: [place], PT Acute Rehabilitation Services [phone] 12/14/2018   Reginia Naas 12/14/2018, 5:02 PM

## 2018-12-14 NOTE — Progress Notes (Signed)
Pt alert, oriented, tolerating diet. D/C instructions given and reviewed with daughter over the phone. Pt will be d/cd home.

## 2018-12-14 NOTE — Discharge Summary (Signed)
Physician Discharge Summary  Julie [name] ZOX:[id number] DOB: [birth year] DOA: 12/13/2018  PCP: [name], DO  Admit date: 12/13/2018 Discharge date: 12/14/2018  Time spent: 45 minutes  Recommendations for Outpatient Follow-up:  1. Follow-up with [name], DO as previously scheduled on 12/19/2018.     Discharge Diagnoses:  Active Problems:   MGUS (monoclonal gammopathy of unknown significance)   Peripheral neuropathy   Generalized weakness   Syncope   Discharge Condition: Stable and improved  Diet recommendation: Heart healthy  Filed Weights   12/13/18 0804 12/14/18 0455  Weight: 99.8 kg 98.3 kg    History of present illness:  HPI per Dr. Fara Chute is a 85 y.o. female with medical history significant of anxiety, arthritis, fibromyalgia, lumbar spinal stenosis, hypertension, prediabetes and several other comorbidities admission below presented to ER with a complaint of generalized body aches and inability to sleep for last 2 days.  According to patient, she sees local rheumatologist Dr. Macky Lower for her lumbar spinal stenosis and fibromyalgia however she continues to have significant aches and pains in her body which has been worse since last 2 days to the point that she has not been able to sleep.  She normally uses cane to help her walking.  She has no other complaints such as chest pain, shortness of breath, fever, chills, sweating, nausea, vomiting, any problem with urination or with bowel movement.   ED Course: Upon arrival to the emergency department, she was hemodynamically stable.  Chest x-ray was done which was unremarkable.  CBC, CMP were unremarkable and she was tested negative for COVID-19.  The plan was to send her home after walking but right after she finished the walk, according to ED physician reportedly she lost consciousness for about a minute when she seemed to be having " staring gaze".  She recovered soon and was completely alert and oriented.   She does not recall part of that event.  Due to this, decision was made by the ER physician to admit the patient and hospital service was consulted for that.  Hospital Course:  1 generalized weakness/body aches Felt secondary to progression of patient's fibromyalgia, spinal stenosis and arthritis.  Patient had no signs or symptoms of infection.  Patient remained afebrile.  Chest x-ray was normal.  Urinalysis was unremarkable.  Patient was assessed by PT OT during the hospitalization and it was felt patient had no further PT needs a follow-up on discharge.  Patient improved clinically and was ambulating in the hallways.  Patient be discharged home in stable and improved condition and is to follow-up with PCP in the outpatient setting.  2. ??  Syncope Patient was admitted with concerns for possible syncope as patient was noted to have a staring spell in the ED.  It was felt patient's symptoms were likely multifactorial mainly secondary to generalized body aches, weakness and insomnia which patient had had for 2 days prior to admission causing significant weakness.  Patient was admitted and monitored.  2D echo which was obtained was normal.  Patient had no further episodes.  Patient had no focal neurological deficits.  Patient had no signs or symptoms of infection.  Urinalysis which was done was unremarkable.  Chest x-ray was unremarkable.  Patient remained afebrile.  Patient will be discharged home in stable and improved condition and is to follow-up with PCP in the outpatient setting.  Patient was discharged in stable condition.  3.  Hypertension Patient noted to have elevated blood pressure on admission which  was felt likely secondary to the fact that patient had not had all her blood pressure medications.  Patient was placed back on home regimen of amlodipine and metoprolol as well as hydralazine as needed.  Blood pressure improved.  Outpatient follow-up with PCP.  Procedures:  2D echo  12/14/2018  Chest x-ray 12/13/2018    Consultations:  None  Discharge Exam: Vitals:   12/14/18 1031 12/14/18 1355  BP: (!) 142/81 (!) 159/82  Pulse: (!) 57 62  Resp:    Temp:  98.9 F (37.2 C)  SpO2:  100%    General: NAD Cardiovascular: RRR Respiratory: CTAB  Discharge Instructions   Discharge Instructions    Diet - low sodium heart healthy   Complete by: As directed    Increase activity slowly   Complete by: As directed      Allergies as of 12/14/2018      Reactions   Codeine Shortness Of Breath   Other    Walnuts-anaphylaxis (patient denies allergy states that she just does not eat WALNUTS)   Aspirin Other (See Comments)   Duodenal ulcer- cannot take aspirin when active.  HYPERSENSITIVE  TO  REGULAR DOSE.        Medication List    TAKE these medications   amLODipine 10 MG tablet Commonly known as: NORVASC Take 10 mg by mouth every morning.   Biotin 10000 MCG Tabs Take 10,000 mcg by mouth daily.   calcium-vitamin D 500-200 MG-UNIT tablet Commonly known as: OSCAL WITH D Take 1 tablet by mouth every morning.   fish oil-omega-3 fatty acids 1000 MG capsule Take 1 g by mouth every morning.   folic acid 1 MG tablet Commonly known as: FOLVITE Take 2 mg by mouth every morning.   FREESTYLE LITE test strip Generic drug: glucose blood TEST ONCE EVERY DAY   Lidocaine 2 % Gel Apply 1 g topically as needed. What changed:   how to take this  reasons to take this   lovastatin 40 MG tablet Commonly known as: MEVACOR 1 (ONE) TABLET TAKE IN THE EVENING AFTER DINNER What changed: See the new instructions.   meloxicam 15 MG tablet Commonly known as: MOBIC Take 15 mg by mouth daily.   metoprolol succinate 50 MG 24 hr tablet Commonly known as: TOPROL-XL Take 50 mg by mouth every evening.   pregabalin 25 MG capsule Commonly known as: Lyrica Take 1 capsule (25 mg total) by mouth 3 (three) times daily.   valsartan-hydrochlorothiazide 320-12.5 MG  tablet Commonly known as: DIOVAN-HCT Take 1 tablet by mouth daily.   vitamin E 400 UNIT capsule Take 400 Units by mouth daily.      Allergies  Allergen Reactions  . Codeine Shortness Of Breath  . Other     Walnuts-anaphylaxis (patient denies allergy states that she just does not eat WALNUTS)  . Aspirin Other (See Comments)    Duodenal ulcer- cannot take aspirin when active.  HYPERSENSITIVE  TO  REGULAR DOSE.     Follow-up Information    [name], DO Follow up on 12/19/2018.   Specialty: Family Medicine Why: follow up as scheduled. Contact information: [address] [place] [address] [phone]            The results of significant diagnostics from this hospitalization (including imaging, microbiology, ancillary and laboratory) are listed below for reference.    Significant Diagnostic Studies: Dg Chest Portable 1 View  Result Date: 12/13/2018 CLINICAL DATA:  Generalized weakness and malaise for the past  2 days. Sleep disturbance. EXAM: PORTABLE CHEST 1 VIEW COMPARISON:  07/31/2018. FINDINGS: Stable enlarged cardiac silhouette and clear lungs with normal vascularity. Stable surgical clips in the region of the gastroesophageal junction. Thoracic spine degenerative changes. IMPRESSION: No acute abnormality. Stable cardiomegaly. Electronically Signed   By: Claudie Revering M.D.   On: 12/13/2018 09:25   Mm Diag Breast Tomo Bilateral  Result Date: 11/16/2018 CLINICAL DATA:  Patient has a history of a left lumpectomy for breast carcinoma in 2017 treated with adjuvant chemotherapy and radiation therapy.The current exam is for routine annual surveillance. EXAM: DIGITAL DIAGNOSTIC BILATERAL MAMMOGRAM WITH CAD AND TOMO COMPARISON:  Previous exam(s). ACR Breast Density Category b: There are scattered areas of fibroglandular density. FINDINGS: Fat necrosis calcifications have developed along the lumpectomy bed. There has been no other interval change. There are no  masses, areas of nonsurgical architectural distortion, areas of suspicious calcifications or areas of significant asymmetry. Mammographic images were processed with CAD. IMPRESSION: 1. No evidence of new or recurrent breast carcinoma. 2. Benign postsurgical changes on the left. RECOMMENDATION: Diagnostic mammography in 1 year per standard post lumpectomy protocol. I have discussed the findings and recommendations with the patient. Results were also provided in writing at the conclusion of the visit. If applicable, a reminder letter will be sent to the patient regarding the next appointment. BI-RADS CATEGORY  2: Benign. Electronically Signed   By: Lajean Manes M.D.   On: 11/16/2018 09:23    Microbiology: Recent Results (from the past 240 hour(s))  Novel Coronavirus, NAA ([facility])     Status: None   Collection Time: 12/07/18 12:00 AM  Result Value Ref Range Status   SARS-CoV-2, NAA Not Detected Not Detected Final    Comment: Testing was performed using the cobas(R) SARS-CoV-2 test. This test was developed and its performance characteristics determined by [employer]. This test has not been FDA cleared or approved. This test has been authorized by FDA under an Emergency Use Authorization (EUA). This test is only authorized for the duration of time the declaration that circumstances exist justifying the authorization of the emergency use of in vitro diagnostic tests for detection of SARS-CoV-2 virus and/or diagnosis of COVID-19 infection under section 564(b)(1) of the Act, 21 U.S.C. 191YNW-2(N)(5), unless the authorization is terminated or revoked sooner. When diagnostic testing is negative, the possibility of a false negative result should be considered in the context of a patient's recent exposures and the presence of clinical signs and symptoms consistent with COVID-19. An individual without symptoms of COVID-19 and who is not shedding SARS-CoV-2 virus would expect to have  a negati ve (not detected) result in this assay.   SARS Coronavirus 2 (CEPHEID - Performed in [place] hospital lab), Hosp Order     Status: None   Collection Time: 12/13/18  8:25 AM   Specimen: Nasopharyngeal Swab  Result Value Ref Range Status   SARS Coronavirus 2 NEGATIVE NEGATIVE Final    Comment: (NOTE) If result is NEGATIVE SARS-CoV-2 target nucleic acids are NOT DETECTED. The SARS-CoV-2 RNA is generally detectable in upper and lower  respiratory specimens during the acute phase of infection. The lowest  concentration of SARS-CoV-2 viral copies this assay can detect is 250  copies / mL. A negative result does not preclude SARS-CoV-2 infection  and should not be used as the sole basis for treatment or other  patient management decisions.  A negative result may occur with  improper specimen collection / handling, submission of specimen other  than  nasopharyngeal swab, presence of viral mutation(s) within the  areas targeted by this assay, and inadequate number of viral copies  (<250 copies / mL). A negative result must be combined with clinical  observations, patient history, and epidemiological information. If result is POSITIVE SARS-CoV-2 target nucleic acids are DETECTED. The SARS-CoV-2 RNA is generally detectable in upper and lower  respiratory specimens dur ing the acute phase of infection.  Positive  results are indicative of active infection with SARS-CoV-2.  Clinical  correlation with patient history and other diagnostic information is  necessary to determine patient infection status.  Positive results do  not rule out bacterial infection or co-infection with other viruses. If result is PRESUMPTIVE POSTIVE SARS-CoV-2 nucleic acids MAY BE PRESENT.   A presumptive positive result was obtained on the submitted specimen  and confirmed on repeat testing.  While 2019 novel coronavirus  (SARS-CoV-2) nucleic acids may be present in the submitted sample  additional  confirmatory testing may be necessary for epidemiological  and / or clinical management purposes  to differentiate between  SARS-CoV-2 and other Sarbecovirus currently known to infect humans.  If clinically indicated additional testing with an alternate test  methodology [phone]) is advised. The SARS-CoV-2 RNA is generally  detectable in upper and lower respiratory sp ecimens during the acute  phase of infection. The expected result is Negative. Fact Sheet for Patients:  StrictlyIdeas.no Fact Sheet for Healthcare Providers: BankingDealers.co.za This test is not yet approved or cleared by the [place] FDA and has been authorized for detection and/or diagnosis of SARS-CoV-2 by FDA under an Emergency Use Authorization (EUA).  This EUA will remain in effect (meaning this test can be used) for the duration of the COVID-19 declaration under Section 564(b)(1) of the Act, 21 U.S.C. section 360bbb-3(b)(1), unless the authorization is terminated or revoked sooner. Performed at [facility], [place] [address], [place], [place] [address]      Labs: Basic Metabolic Panel: Recent Labs  Lab 12/13/18 0920 12/14/18 0351  NA 141 141  K 3.8 3.7  CL 103 108  CO2 27 24  GLUCOSE 115* 98  BUN 22 17  CREATININE 0.98 0.78  CALCIUM 9.7 8.8*   Liver Function Tests: Recent Labs  Lab 12/13/18 0920 12/14/18 0351  AST 28 23  ALT 19 15  ALKPHOS 79 66  BILITOT 1.1 0.8  PROT 8.0 6.6  ALBUMIN 4.3 3.5   No results for input(s): LIPASE, AMYLASE in the last 168 hours. No results for input(s): AMMONIA in the last 168 hours. CBC: Recent Labs  Lab 12/13/18 0920 12/14/18 0351  WBC 5.6 5.1  HGB 12.9 11.4*  HCT 40.2 36.5  MCV 88.5 89.7  PLT 214 194   Cardiac Enzymes: No results for input(s): CKTOTAL, CKMB, CKMBINDEX, TROPONINI in the last 168 hours. BNP: BNP (last 3 results) No results for input(s): BNP in the last 8760  hours.  ProBNP (last 3 results) No results for input(s): PROBNP in the last 8760 hours.  CBG: Recent Labs  Lab 12/13/18 1647 12/13/18 1815 12/13/18 2223 12/14/18 0718 12/14/18 1141  GLUCAP 94 87 115* 92 92       Signed:  Irine Seal MD.  [facility] 12/14/2018, 3:50 PM

## 2018-12-14 NOTE — Evaluation (Signed)
Occupational Therapy Evaluation Patient Details Name: Julie [name] MRN: [id number] DOB: 01-Feb-1933 Today's Date: 12/14/2018    History of Present Illness 85 year old female was admitted for generalized weakness.  PMH:  fibromyalgia, anxiety, arthritis, spinal stenosis, HTN, DM   Clinical Impression   Pt was admitted for the above. At baseline, she is mod I and very independent natured including driving. Pt has 2 daughters who live with her. She currently needs min guard for safety when ambulating. Will follow in acute setting with mod I level goals.    Follow Up Recommendations  Home health OT;Supervision - Intermittent(depending upon progress)    Equipment Recommendations  (tba further; pt has standard commode)    Recommendations for Other Services       Precautions / Restrictions Precautions Precautions: Fall Restrictions Weight Bearing Restrictions: No      Mobility Bed Mobility Overal bed mobility: Modified Independent             General bed mobility comments: for back to bed  Transfers Overall transfer level: Needs assistance Equipment used: (grab bar) Transfers: Sit to/from Stand Sit to Stand: Supervision         General transfer comment: IV pole near by; used grab bar to stand from commode    Balance                                           ADL either performed or assessed with clinical judgement   ADL Overall ADL's : Needs assistance/impaired     Grooming: Wash/dry hands;Supervision/safety;Standing                   Toilet Transfer: Min guard;Ambulation;Comfort height toilet   Toileting- Clothing Manipulation and Hygiene: Supervision/safety;Sit to/from stand         General ADL Comments: pt needs set up and supervision for sit to stand for adls. Ambulated back from bathroom wtih IV pole with min guard assist for safety:  pt would benefit from using RW for increased safety     Vision         Perception      Praxis      Pertinent Vitals/Pain Pain Assessment: Faces Faces Pain Scale: Hurts a little bit Pain Location: generalized Pain Descriptors / Indicators: Sore Pain Intervention(s): Limited activity within patient's tolerance;Monitored during session;Premedicated before session;Repositioned     Hand Dominance     Extremity/Trunk Assessment Upper Extremity Assessment Upper Extremity Assessment: Overall WFL for tasks assessed           Communication Communication Communication: No difficulties   Cognition Arousal/Alertness: Awake/alert Behavior During Therapy: WFL for tasks assessed/performed Overall Cognitive Status: Within Functional Limits for tasks assessed                                     General Comments       Exercises     Shoulder Instructions      Home Living Family/patient expects to be discharged to:: Private residence Living Arrangements: Children Available Help at Discharge: Family Type of Home: House Home Access: Stairs to enter Technical brewer of Steps: 3 Entrance Stairs-Rails: Right;Left Home Layout: Two level     Bathroom Shower/Tub: Teacher, early years/pre: Standard     Home Equipment: Radio producer - quad  Additional Comments: 1/2 bath down      Prior Functioning/Environment Level of Independence: Independent with assistive device(s)        Comments: uses cane; has RWs        OT Problem List: Decreased strength;Decreased activity tolerance;Impaired balance (sitting and/or standing);Pain      OT Treatment/Interventions: Self-care/ADL training;Energy conservation;DME and/or AE instruction;Patient/family education;Balance training;Therapeutic activities    OT Goals(Current goals can be found in the care plan section) Acute Rehab OT Goals Patient Stated Goal: get strength back and return to independence OT Goal Formulation: With patient Time For Goal Achievement: 12/28/18 Potential to Achieve Goals:  Good ADL Goals Pt Will Transfer to Toilet: with modified independence;ambulating;regular height toilet(vs 3:1 over commode) Additional ADL Goal #1: pt will gather clothes and perform adl at mod I level  OT Frequency: Min 2X/week   Barriers to D/C:            Co-evaluation              AM-PAC OT "6 Clicks" Daily Activity     Outcome Measure Help from another person eating meals?: None Help from another person taking care of personal grooming?: A Little Help from another person toileting, which includes using toliet, bedpan, or urinal?: A Little Help from another person bathing (including washing, rinsing, drying)?: A Little Help from another person to put on and taking off regular upper body clothing?: A Little Help from another person to put on and taking off regular lower body clothing?: A Little 6 Click Score: 19   End of Session    Activity Tolerance: Patient tolerated treatment well Patient left: in bed;with call bell/phone within reach;with bed alarm set  OT Visit Diagnosis: Muscle weakness (generalized) (M62.81);Unsteadiness on feet (R26.81)                Time: 0034-9179 OT Time Calculation (min): 20 min Charges:  OT General Charges $OT Visit: 1 Visit OT Evaluation $OT Eval Low Complexity: [place], OTR/L Acute Rehabilitation Services [phone] [facility] pager [phone] office 12/14/2018  [place] 12/14/2018, 10:56 AM

## 2018-12-14 NOTE — Progress Notes (Signed)
Pt said due to some confusion she was made a DNR in the Ed last night and she does not want to be a DNR.

## 2018-12-14 NOTE — Progress Notes (Signed)
  Echocardiogram 2D Echocardiogram has been performed.  Julie [name] 12/14/2018, 9:52 AM

## 2018-12-14 NOTE — Plan of Care (Signed)
All goals met for d/c 

## 2018-12-15 DIAGNOSIS — R531 Weakness: Secondary | ICD-10-CM

## 2018-12-15 DIAGNOSIS — I10 Essential (primary) hypertension: Secondary | ICD-10-CM

## 2018-12-15 DIAGNOSIS — R55 Syncope and collapse: Secondary | ICD-10-CM

## 2018-12-19 DIAGNOSIS — I119 Hypertensive heart disease without heart failure: Secondary | ICD-10-CM | POA: Diagnosis not present

## 2018-12-19 DIAGNOSIS — M4800 Spinal stenosis, site unspecified: Secondary | ICD-10-CM | POA: Diagnosis not present

## 2018-12-19 DIAGNOSIS — M0579 Rheumatoid arthritis with rheumatoid factor of multiple sites without organ or systems involvement: Secondary | ICD-10-CM | POA: Diagnosis not present

## 2018-12-19 DIAGNOSIS — E119 Type 2 diabetes mellitus without complications: Secondary | ICD-10-CM | POA: Diagnosis not present

## 2018-12-19 DIAGNOSIS — M4316 Spondylolisthesis, lumbar region: Secondary | ICD-10-CM | POA: Diagnosis not present

## 2019-01-03 ENCOUNTER — Telehealth: Payer: Self-pay | Admitting: *Deleted

## 2019-01-03 NOTE — Telephone Encounter (Signed)
Received call from pt stating that she has been vomiting a lot lately.  She reported taking duloxetine @ 2 wks ago that made her nauseas & she vomited & hasn' taken anymore.  She has been on tramadol tid & asked if she had been taking with food.  She reports no one told her that she needed to eat with this.  She report h/o duodenal ulcer.  Dr [name] prescribed the Ultram/tramadol.  Suggests she call Dr [name].  She feels better now after vomiting.  She took 4 Tums. She also reports being on hydroxychloroquine from Rheumotologist.  Pt understands to talk with PCP.  Message routed to Dr Feng/Pod RN

## 2019-01-09 ENCOUNTER — Telehealth: Payer: Self-pay | Admitting: Neurology

## 2019-01-09 ENCOUNTER — Ambulatory Visit (INDEPENDENT_AMBULATORY_CARE_PROVIDER_SITE_OTHER): Payer: Medicare Other | Admitting: Neurology

## 2019-01-09 ENCOUNTER — Encounter: Payer: Self-pay | Admitting: Neurology

## 2019-01-09 ENCOUNTER — Other Ambulatory Visit: Payer: Self-pay

## 2019-01-09 VITALS — BP 138/64 | HR 60 | Temp 97.8°F | Ht 64.0 in | Wt 214.0 lb

## 2019-01-09 DIAGNOSIS — G3281 Cerebellar ataxia in diseases classified elsewhere: Secondary | ICD-10-CM

## 2019-01-09 DIAGNOSIS — R269 Unspecified abnormalities of gait and mobility: Secondary | ICD-10-CM

## 2019-01-09 DIAGNOSIS — R202 Paresthesia of skin: Secondary | ICD-10-CM | POA: Diagnosis not present

## 2019-01-09 NOTE — Progress Notes (Signed)
PATIENT: Julie [name] DOB: [birth year]  Chief Complaint  Patient presents with  . Jerking Movements    She is here with her daughter, [name]. Reports numbness, tingling and jerking motions in her bilateral upper extremities.  She also had an episode, while driving, when she was unable to move her right foot off the gas pedal.  She is no longer operating a car since this happened.   [name] PCP    Julie Morning, DO     HISTORICAL  Julie [name] is a 85 year old female, seen in request by her primary care physician Dr. [name], [place], for evaluation of bilateral hands and feet paresthesia, initial evaluation was on January 09, 2019.  I have reviewed and summarized the referring note from the referring physician.  She had past medical history of hypertension, hyperlipidemia, left breast cancer, status post left lobectomy, chemotherapy, in 2017, during that time, she developed bilateral fingertips and feet paresthesia, also had a history of chronic low back pain, carries a diagnosis of lumbar stenosis, has gradual onset gait abnormality  Over the past 3 years, she continue to experience worsening bilateral fingertips and feet paresthesia, numbness tingling stay at the bottom of her feet, and toes, she also complains of worsening low back pain, radiating pain to bilateral lower extremities, last week while driving, she described difficulty moving her right leg off the paddle, she also complains of bilateral hands tremor, difficulty holding on small object  I also personally reviewed hospital discharge summary on December 14, 2018, when she was admitted for generalized whole body achy pain, weakness, there was also described episode of staring spells at emergency room,  Echocardiogram December 14, 2018: Ejection fraction 60 to 65%, cavity size was normal, mild increased left ventricular wall thickness   I personally reviewed MRI of the brain without contrast on March 13, 2018: No acute abnormality,  moderate supratentorium small vessel disease  MRI of lumbar in January 2018, multilevel degenerative disc disease, mild to moderate canal stenosis at L2-3, L3-4, mild at L4-5, with variable degree of neural foraminal narrowing, severe neural foraminal narrowing at right L3-4, L4-5,   Laboratory evaluations in July 2020, normal TSH, CBC, CMP showed mild elevated glucose 115, normal CPK,  REVIEW OF SYSTEMS: Full 14 system review of systems performed and notable only for as above All other review of systems were negative.  ALLERGIES: Allergies  Allergen Reactions  . Codeine Shortness Of Breath  . Other     Walnuts-anaphylaxis (patient denies allergy states that she just does not eat WALNUTS)  . Aspirin Other (See Comments)    Duodenal ulcer- cannot take aspirin when active.  HYPERSENSITIVE  TO  REGULAR DOSE.      HOME MEDICATIONS: Current Outpatient Medications  Medication Sig Dispense Refill  . amLODipine (NORVASC) 10 MG tablet Take 10 mg by mouth every [name].     . Biotin 10000 MCG TABS Take 10,000 mcg by mouth daily.     . calcium-vitamin D (OSCAL WITH D) 500-200 MG-UNIT per tablet Take 1 tablet by mouth every [name].    . fish oil-omega-3 fatty acids 1000 MG capsule Take 1 g by mouth every [name].     . folic acid (FOLVITE) 1 MG tablet Take 2 mg by mouth every [name].     [name] FREESTYLE LITE test strip TEST ONCE EVERY DAY  4  . hydroxychloroquine (PLAQUENIL) 200 MG tablet Take 200 mg by mouth daily.    . Lidocaine 2 % GEL Apply 1  g topically as needed. (Patient taking differently: Apply 1 g topically as needed (pain). ) 2 Tube 11  . lovastatin (MEVACOR) 40 MG tablet 1 (ONE) TABLET TAKE IN THE EVENING AFTER DINNER (Patient taking differently: Take 40 mg by mouth every evening. ) 90 tablet 1  . meloxicam (MOBIC) 15 MG tablet Take 15 mg by mouth daily.  11  . metoprolol succinate (TOPROL-XL) 50 MG 24 hr tablet Take 50 mg by mouth every evening.   2  . traMADol (ULTRAM) 50 MG  tablet Take 100 mg by mouth 3 (three) times daily as needed.    . valsartan-hydrochlorothiazide (DIOVAN-HCT) 320-12.5 MG per tablet Take 1 tablet by mouth daily.    . vitamin E 400 UNIT capsule Take 400 Units by mouth daily.    . pregabalin (LYRICA) 25 MG capsule Take 1 capsule (25 mg total) by mouth 3 (three) times daily. 90 capsule 0   No current facility-administered medications for this visit.     PAST MEDICAL HISTORY: Past Medical History:  Diagnosis Date  . Anemia   . Anxiety    takes Klonopin at bedtime  . Arthritis    takes Methotrexate weekly and Prednisone  . Blood transfusion    no abnormal reaction noted  . Cancer (HCC)    breast   . Cataracts, bilateral    immature  . Complication of anesthesia    wakes up during surgery  . Depression   . Diabetes mellitus without complication ([place])   . Dyspnea    with exertion since chemo   . Fibromyalgia    takes Lyrica daily  . GERD (gastroesophageal reflux disease)    takes Nexium daily  . Glaucoma   . Hard of hearing   . Heart murmur   . Histoplasmosis   . History of colon polyps   . History of gout   . History of hiatal hernia   . History of shingles   . Hyperlipidemia   . Hypertension    takes Amlodipine,Metoprolol,and Losartan daily  . Incontinence of bowel   . Interstitial cystitis   . Joint pain   . Low back pain   . Nocturia   . Peripheral neuropathy   . Peripheral neuropathy   . Personal history of chemotherapy   . Personal history of radiation therapy   . PONV (postoperative nausea and vomiting)   . Ulcer 1970   duodenal  . Urinary frequency   . Urinary urgency   . Weakness    numbness in both hands and feet    PAST SURGICAL HISTORY: Past Surgical History:  Procedure Laterality Date  . ABDOMINAL HYSTERECTOMY    . accupuncture  3 yrs ago  . ANKLE FUSION Left 05/10/2014   Procedure: LEFT ANKLE ARTHRODESIS ;  Surgeon: Wylene Simmer, MD;  Location: [facility];  Service: Orthopedics;  Laterality: Left;   . ANKLE SURGERY Left   . arm surgery Left    radius  . bladder tacked     . BREAST LUMPECTOMY Left   . CERVICAL FUSION    . CHOLECYSTECTOMY    . COLONOSCOPY    . CYSTOCELE REPAIR    . DILATION AND CURETTAGE OF UTERUS    . ESOPHAGOGASTRODUODENOSCOPY    . PORT A CATH REVISION N/A 12/27/2015   Procedure: PORT A CATH REVISION;  Surgeon: Rolm Bookbinder, MD;  Location: [facility];  Service: General;  Laterality: N/A;  . PORT-A-CATH REMOVAL N/A 03/25/2016   Procedure: REMOVAL PORT-A-CATH;  Surgeon: Rodman Key  [name], MD;  Location: [facility];  Service: General;  Laterality: N/A;  . PORTACATH PLACEMENT Right 12/27/2015   Procedure: INSERTION PORT-A-CATH WITH ULTRASOUND ;  Surgeon: Rolm Bookbinder, MD;  Location: [facility];  Service: General;  Laterality: Right;  . RADIOACTIVE SEED GUIDED PARTIAL MASTECTOMY WITH AXILLARY SENTINEL LYMPH NODE BIOPSY Left 11/14/2015   Procedure: RADIOACTIVE SEED GUIDED LEFT BREAST LUMPECTOMY WITH AXILLARY SENTINEL LYMPH NODE BIOPSY;  Surgeon: Rolm Bookbinder, MD;  Location: [facility];  Service: General;  Laterality: Left;  RADIOACTIVE SEED GUIDED LEFT BREAST LUMPECTOMY WITH AXILLARY SENTINEL LYMPH NODE BIOPSY  . SALPINGOOPHORECTOMY Bilateral   . TOTAL KNEE ARTHROPLASTY Right 02/28/2015   Procedure: RIGHT TOTAL KNEE ARTHROPLASTY;  Surgeon: Rod Can, MD;  Location: [facility];  Service: Orthopedics;  Laterality: Right;  . UPPER GASTROINTESTINAL ENDOSCOPY    . VAGINAL HYSTERECTOMY      FAMILY HISTORY: Family History  Problem Relation Age of Onset  . Colon cancer Sister 6  . Esophageal cancer Brother 7  . Breast cancer Mother   . Brain cancer Other   . Other Father        cerebral hemorrhage    SOCIAL HISTORY: Social History   Socioeconomic History  . Marital status: Divorced    Spouse name: Not on file  . Number of children: 5  . Years of education: some college  . Highest education level: Not on file   Occupational History  . Occupation: Retired  Scientific laboratory technician  . Financial resource strain: Not on file  . Food insecurity    Worry: Not on file    Inability: Not on file  . Transportation needs    Medical: Not on file    Non-medical: Not on file  Tobacco Use  . Smoking status: Never Smoker  . Smokeless tobacco: Never Used  Substance and Sexual Activity  . Alcohol use: No    Comment: IN [place] ONLY  . Drug use: No  . Sexual activity: Not on file  Lifestyle  . Physical activity    Days per week: Not on file    Minutes per session: Not on file  . Stress: Not on file  Relationships  . Social Herbalist on phone: Not on file    Gets together: Not on file    Attends religious service: Not on file    Active member of club or organization: Not on file    Attends meetings of clubs or organizations: Not on file    Relationship status: Not on file  . Intimate partner violence    Fear of current or ex partner: Not on file    Emotionally abused: Not on file    Physically abused: Not on file    Forced sexual activity: Not on file  Other Topics Concern  . Not on file  Social History Narrative   Lives at home with her daughter.   Right-handed.   0.5 cup caffeine per day.     PHYSICAL EXAM   Vitals:   01/09/19 0850  BP: 138/64  Pulse: 60  Temp: 97.8 F (36.6 C)  Weight: 214 lb (97.1 kg)  Height: 5\' 4"  (1.626 m)    Not recorded      Body mass index is 36.73 kg/m.  PHYSICAL EXAMNIATION:  Gen: NAD, conversant, well nourised, obese, well groomed                     Cardiovascular: Regular rate rhythm,  no peripheral edema, warm, nontender. Eyes: Conjunctivae clear without exudates or hemorrhage Neck: Supple, no carotid bruits. Pulmonary: Clear to auscultation bilaterally   NEUROLOGICAL EXAM:  MENTAL STATUS: Speech:    Speech is normal; fluent and spontaneous with normal comprehension.  Cognition:     Orientation to time, place and person     Normal  recent and remote memory     Normal Attention span and concentration     Normal Language, naming, repeating,spontaneous speech     Fund of knowledge   CRANIAL NERVES: CN II: Visual fields are full to confrontation  Pupils are round equal and briskly reactive to light. CN III, IV, VI: extraocular movement are normal. No ptosis. CN V: Facial sensation is intact to pinprick in all 3 divisions bilaterally. Corneal responses are intact.  CN VII: Face is symmetric with normal eye closure and smile. CN VIII: Hearing is normal to rubbing fingers CN IX, X: Palate elevates symmetrically. Phonation is normal. CN XI: Head turning and shoulder shrug are intact CN XII: Tongue is midline with normal movements and no atrophy.  MOTOR: There is no pronator drift of out-stretched arms. Muscle bulk and tone are normal. Muscle strength is normal.  REFLEXES: Reflexes are 1 and symmetric at the biceps, triceps, knees, and ankles. Plantar responses are flexor.  SENSORY: Length dependent decreased to light touch, and vibratory sensation to ankle level  COORDINATION: Rapid alternating movements and fine finger movements are intact. There is no dysmetria on finger-to-nose and heel-knee-shin.    GAIT/STANCE: She needs push-up to get up from seated position, antalgic, unsteady  DIAGNOSTIC DATA (LABS, IMAGING, TESTING) - I reviewed patient records, labs, notes, testing and imaging myself where available.   ASSESSMENT AND PLAN  CARLETTA FEASEL is a 85 y.o. female   Bilateral upper and lower extremity paresthesia Gait abnormalities  Mild length dependent sensory changes on examinations,  Differentiation diagnosis including peripheral neuropathy, need to rule out cervical myelopathy  EMG nerve conduction study  MRI of cervical spine  Laboratory evaluations for treatable etiology of peripheral neuropathy  Referred to physical therapy  Marcial Pacas, M.D. Ph.D.  [facility] [address] [place], [place] [address] Ph: [phone] Fax: [phone]  CC: Julie Morning, DO

## 2019-01-09 NOTE — Telephone Encounter (Signed)
Medicare/[employer] order sent to [facility]. They will reach out to the patient to schedule.

## 2019-01-10 ENCOUNTER — Telehealth: Payer: Self-pay | Admitting: Neurology

## 2019-01-10 MED ORDER — CLOPIDOGREL BISULFATE 75 MG PO TABS: 75.0000 mg | ORAL_TABLET | Freq: Every day | ORAL | 4 refills | Status: DC

## 2019-01-10 NOTE — Telephone Encounter (Signed)
Pt called in to see what the update is with her Plavix prescription. Please advise.

## 2019-01-10 NOTE — Addendum Note (Signed)
Addended by: Marcial Pacas on: 01/10/2019 08:47 AM   Modules accepted: Orders

## 2019-01-10 NOTE — Telephone Encounter (Signed)
I called the patient again and was able to speak to her.  She will pick up the Plavix and start the medication.

## 2019-01-10 NOTE — Telephone Encounter (Signed)
Dr. Krista Blue has sent her Plavix prescription to [facility] this morning.  I attempted to return the call to the patient, several times, but her line is ringing busy.

## 2019-01-11 ENCOUNTER — Ambulatory Visit (INDEPENDENT_AMBULATORY_CARE_PROVIDER_SITE_OTHER): Payer: Medicare Other | Admitting: Neurology

## 2019-01-11 ENCOUNTER — Other Ambulatory Visit: Payer: Self-pay

## 2019-01-11 ENCOUNTER — Telehealth: Payer: Self-pay | Admitting: Neurology

## 2019-01-11 ENCOUNTER — Encounter (INDEPENDENT_AMBULATORY_CARE_PROVIDER_SITE_OTHER): Payer: Medicare Other | Admitting: Neurology

## 2019-01-11 DIAGNOSIS — R202 Paresthesia of skin: Secondary | ICD-10-CM

## 2019-01-11 DIAGNOSIS — G3281 Cerebellar ataxia in diseases classified elsewhere: Secondary | ICD-10-CM

## 2019-01-11 DIAGNOSIS — Z0289 Encounter for other administrative examinations: Secondary | ICD-10-CM

## 2019-01-11 DIAGNOSIS — R269 Unspecified abnormalities of gait and mobility: Secondary | ICD-10-CM

## 2019-01-11 LAB — PROTEIN ELECTROPHORESIS, SERUM
A/G Ratio: 1.2 (ref 0.7–1.7)
Albumin ELP: 3.9 g/dL (ref 2.9–4.4)
Alpha 1: 0.2 g/dL (ref 0.0–0.4)
Alpha 2: 0.6 g/dL (ref 0.4–1.0)
Beta: 1.7 g/dL — ABNORMAL HIGH (ref 0.7–1.3)
Gamma Globulin: 0.7 g/dL (ref 0.4–1.8)
Globulin, Total: 3.2 g/dL (ref 2.2–3.9)
M-Spike, %: 0.8 g/dL — ABNORMAL HIGH
Total Protein: 7.1 g/dL (ref 6.0–8.5)

## 2019-01-11 LAB — C-REACTIVE PROTEIN: CRP: <1 mg/L (ref 0–10)

## 2019-01-11 LAB — SEDIMENTATION RATE: Sed Rate: 57 mm/hr — ABNORMAL HIGH (ref 0–40)

## 2019-01-11 LAB — HEMOGLOBIN A1C
Est. average glucose Bld gHb Est-mCnc: 126 mg/dL
Hgb A1c MFr Bld: 6 % — ABNORMAL HIGH (ref 4.8–5.6)

## 2019-01-11 LAB — ANA W/REFLEX IF POSITIVE: Anti Nuclear Antibody (ANA): NEGATIVE

## 2019-01-11 LAB — VITAMIN B12: Vitamin B-12: 1527 pg/mL — ABNORMAL HIGH (ref 232–1245)

## 2019-01-11 LAB — HIV ANTIBODY (ROUTINE TESTING W REFLEX): HIV Screen 4th Generation wRfx: NONREACTIVE

## 2019-01-11 LAB — RPR: RPR Ser Ql: NONREACTIVE

## 2019-01-11 NOTE — Procedures (Signed)
Full Name: Julie [name] Gender: Female MRN #: [id number] Date of Birth: Oct 16, 1932    Visit Date: 01/11/2019 08:48 Age: 85 Years 33 Months Old Examining Physician: Marcial Pacas, MD  Referring Physician: Marcial Pacas, MD History: 85 years old female presented with bilateral upper and lower extremities paresthesia.  Summary of tests:  Nerve conduction studies: Bilateral sural sensory responses were normal.  Right superficial peroneal sensory response showed mildly decreased SNAP amplitude. Left superficial peroneal sensory response was absent.  Right median, ulnar sensory response showed moderately prolonged peak latency, with mildly decreased snap amplitude.  Right radial sensory response showed normal peak latency, with mildly decreased snap amplitude.  Bilateral tibial, right peroneal to EDB motor responses were normal.  Left peroneal to EDB motor response was absent, she had a history of left ankle surgery in the past  Right ulnar, medial motor responses were normal.  Electromyography:  Selective needle examinations were performed at right lower, upper extremity muscles, right lumbar, cervical paraspinal muscles.  There was no significant abnormality noted.  Conclusion: This is a mild abnormal study, there is evidence of left deep peroneal nerve branch neuropathy likely related to her previous left ankle surgery.  There is no evidence of large fiber peripheral neuropathy, right cervical or lumbar sacral radiculopathy.    ------------------------------- Marcial Pacas, M.D. PhD  [facility] [place], [place] [address] Tel: [phone] Fax: [phone]        [facility]    Nerve / Sites Muscle Latency Ref. Amplitude Ref. Rel Amp Segments Distance Velocity Ref. Area    ms ms mV mV %  cm m/s m/s mVms  R Median - APB     Wrist APB 4.1 ?4.4 7.1 ?4.0 100 Wrist - APB 7   22.2     Upper arm APB 9.0  6.3  89.1 Upper arm - Wrist 25 51 ?49 19.5  R Ulnar - ADM      Wrist ADM 2.9 ?3.3 6.5 ?6.0 100 Wrist - ADM 7   22.9     B.Elbow ADM 6.9  5.9  91.1 B.Elbow - Wrist 20 51 ?49 22.5     A.Elbow ADM 8.9  5.7  96.2 A.Elbow - B.Elbow 10 49 ?49 22.4         A.Elbow - Wrist      R Peroneal - EDB     Ankle EDB 4.4 ?6.5 3.2 ?2.0 100 Ankle - EDB 9   9.9     Fib head EDB 11.0  2.7  84.3 Fib head - Ankle 31 47 ?44 9.6     Pop fossa EDB 13.2  2.6  95.4 Pop fossa - Fib head 10 45 ?44 9.3         Pop fossa - Ankle      L Peroneal - EDB     Ankle EDB NR ?6.5 NR ?2.0 NR Ankle - EDB 9   NR     Fib head EDB NR  NR  NR Fib head - Ankle 28 NR ?44 NR     Pop fossa EDB NR  NR  NR Pop fossa - Fib head 10 NR ?44 NR         Pop fossa - Ankle      R Tibial - AH     Ankle AH 5.2 ?5.8 4.1 ?4.0 100 Ankle - AH 9   7.0     Pop fossa AH 14.2  3.0  73.5 Pop  fossa - Ankle 37 41 ?41 6.7  L Tibial - AH     Ankle AH 4.6 ?5.8 4.6 ?4.0 100 Ankle - AH 9   8.4     Pop fossa AH 13.6  2.1  45.8 Pop fossa - Ankle 37 41 ?41 5.5                      SNC    Nerve / Sites Rec. Site Peak Lat Ref.  Amp Ref. Segments Distance    ms ms V V  cm  R Radial - Anatomical snuff box (Forearm)     Forearm Wrist 2.5 ?2.9 10 ?15 Forearm - Wrist 10  R Sural - Ankle (Calf)     Calf Ankle 3.4 ?4.4 7 ?6 Calf - Ankle 14  L Sural - Ankle (Calf)     Calf Ankle 3.9 ?4.4 5 ?6 Calf - Ankle 14  R Superficial peroneal - Ankle     Lat leg Ankle 4.2 ?4.4 4 ?6 Lat leg - Ankle 14  L Superficial peroneal - Ankle     Lat leg Ankle NR ?4.4 NR ?6 Lat leg - Ankle 14  R Median - Orthodromic (Dig II, Mid palm)     Dig II Wrist 4.1 ?3.4 4 ?10 Dig II - Wrist 13  R Ulnar - Orthodromic, (Dig V, Mid palm)     Dig V Wrist 3.6 ?3.1 3 ?5 Dig V - Wrist 2                   F  Wave    Nerve F Lat Ref.   ms ms  R Tibial - AH 49.1 ?56.0  L Tibial - AH 55.4 ?56.0  R Ulnar - ADM 30.6 ?32.0           EMG       EMG Summary Table    Spontaneous MUAP Recruitment  Muscle IA Fib PSW Fasc Other Amp Dur. Poly Pattern  R.  Tibialis anterior Normal None None None _______ Normal Normal Normal Normal  R. Tibialis posterior Normal None None None _______ Normal Normal Normal Normal  R. Peroneus longus Normal None None None _______ Normal Normal Normal Normal  R. Gastrocnemius (Medial head) Normal None None None _______ Normal Normal Normal Normal  R. Vastus lateralis Normal None None None _______ Normal Normal Normal Normal  R. Lumbar paraspinals (mid) Normal None None None _______ Normal Normal Normal Normal  R. Lumbar paraspinals (low) Normal None None None _______ Normal Normal Normal Normal  R. First dorsal interosseous Normal None None None _______ Normal Normal Normal Normal  R. Pronator teres Normal None None None _______ Normal Normal Normal Normal  R. Extensor digitorum communis Normal None None None _______ Normal Normal Normal Normal  R. Biceps brachii Normal None None None _______ Normal Normal Normal Normal  R. Deltoid Normal None None None _______ Normal Normal Normal Normal  R. Cervical paraspinals Normal None None None _______ Normal Normal Normal Normal

## 2019-01-11 NOTE — Telephone Encounter (Signed)
I have spoken with the patient and she is aware of her results.  She is agreeable to watch her weight and diet.

## 2019-01-11 NOTE — Telephone Encounter (Signed)
Please call patient, laboratory evaluation showed mild elevated A1c 6.0, he should started with weight control, diet   very low titer M spike 0.8 on protein electrophoresis, may consider repeat electrophoresis testing later  elevated ESR and of normal C-reactive protein, above findings has unknown clinical significance  Rest of the laboratory evaluation showed no significant abnormality 

## 2019-01-13 ENCOUNTER — Telehealth: Payer: Self-pay | Admitting: Neurology

## 2019-01-13 NOTE — Telephone Encounter (Signed)
Patient called office d/t dizziness and lightheadedness since starting the gen. Plavix some 3 days ago; she is encouraged to continue and advised, that new medication can initially cause some vague SEs and they can improve with time. As long as she is not severely affected with feeling of off balance or nausea, she may feel better with increasing her fluid intake and changing positions slowly; also, plavix is not common to cause dizziness as a SE. I told her, alternatively, she could reduce to 1/2 pill for the next 3 days and see how she feels. She opted to continue. I told her I would let Dr. Krista Blue and her nurse know for further guidance next week. She demonstrated understanding and agreement.

## 2019-01-13 NOTE — Telephone Encounter (Signed)
Pt has called stating she is having a reaction to her clopidogrel (PLAVIX) 75 MG tablet.  Pt states she is light headed and dizzy.  Please call

## 2019-01-17 ENCOUNTER — Ambulatory Visit: Payer: Medicare Other | Admitting: Physical Therapy

## 2019-01-17 ENCOUNTER — Telehealth: Payer: Self-pay | Admitting: Neurology

## 2019-01-17 NOTE — Telephone Encounter (Signed)
Common side effect of Plavix is related to the bleeding complications,  What she described is less likely related to the Plavix, she does have vascular risk factor of hypertension, hyperlipidemia, Plavix was started based on abnormal MRI scan in October 2019, there was evidence of moderate chronic microvascular ischemia,  She should contact her primary care or cardiology for evaluation of above described symptoms,

## 2019-01-17 NOTE — Telephone Encounter (Signed)
Pt called stating that she is having vomiting, sweats and tightness in chest ever since she started the Plavix. Please advise.

## 2019-01-17 NOTE — Telephone Encounter (Signed)
She is aware of Dr. Rhea Belton response and agreeable to this plan.  She will keep her pending follow up on 02/21/2019.

## 2019-01-17 NOTE — Telephone Encounter (Signed)
Hold off plavix for now, may consider ecasa 81mg  or restart plavix later (following her planned Sept revisit)

## 2019-01-17 NOTE — Telephone Encounter (Signed)
I returned the call to the patient.  States her PCP, Dr. [name], instructed her to stop the Plavix today.  She did not instruct her to replace it with any medication.  The patient is not able to tolerate aspirin.  She would like to know the next step.  Her PCP also gave her ondansetron for nausea.

## 2019-01-19 DIAGNOSIS — I10 Essential (primary) hypertension: Secondary | ICD-10-CM | POA: Diagnosis not present

## 2019-01-19 DIAGNOSIS — E785 Hyperlipidemia, unspecified: Secondary | ICD-10-CM | POA: Diagnosis not present

## 2019-01-19 DIAGNOSIS — R110 Nausea: Secondary | ICD-10-CM | POA: Diagnosis not present

## 2019-01-19 DIAGNOSIS — K219 Gastro-esophageal reflux disease without esophagitis: Secondary | ICD-10-CM | POA: Diagnosis not present

## 2019-01-19 DIAGNOSIS — M5136 Other intervertebral disc degeneration, lumbar region: Secondary | ICD-10-CM | POA: Diagnosis not present

## 2019-01-19 DIAGNOSIS — M4316 Spondylolisthesis, lumbar region: Secondary | ICD-10-CM | POA: Diagnosis not present

## 2019-02-02 ENCOUNTER — Other Ambulatory Visit: Payer: Self-pay | Admitting: Cardiology

## 2019-02-02 DIAGNOSIS — Z23 Encounter for immunization: Secondary | ICD-10-CM | POA: Diagnosis not present

## 2019-02-06 ENCOUNTER — Ambulatory Visit
Admission: RE | Admit: 2019-02-06 | Discharge: 2019-02-06 | Disposition: A | Discharge status: Home / Self Care | Payer: Medicare Other | Source: Ambulatory Visit | Attending: Neurology | Admitting: Neurology

## 2019-02-06 DIAGNOSIS — G3281 Cerebellar ataxia in diseases classified elsewhere: Secondary | ICD-10-CM

## 2019-02-08 ENCOUNTER — Telehealth: Payer: Self-pay | Admitting: Neurology

## 2019-02-08 NOTE — Telephone Encounter (Signed)
Please call patient, MRI cervical showed evidence of previous anterior cervical fusion c4-5-6, no evidence of spinal canal or foraminal stenosis   IMPRESSION:   MRI cervical spine (without) demonstrating: - At C6-7: disc bulging with no spinal stenosis or foraminal narrowing   - Anterior cervical discectomy and fusion with metal hardware at C4-C5-C6.

## 2019-02-08 NOTE — Telephone Encounter (Signed)
I spoke to the patient and provided her with the MRI results.  She would like her follow up to be moved up with Dr. Krista Blue.  She has been rescheduled to 02/16/2019.

## 2019-02-09 ENCOUNTER — Ambulatory Visit: Payer: Medicare Other | Admitting: Physical Therapy

## 2019-02-15 ENCOUNTER — Encounter: Payer: Self-pay | Admitting: *Deleted

## 2019-02-16 ENCOUNTER — Other Ambulatory Visit: Payer: Self-pay

## 2019-02-16 ENCOUNTER — Ambulatory Visit (INDEPENDENT_AMBULATORY_CARE_PROVIDER_SITE_OTHER): Payer: Medicare Other | Admitting: Neurology

## 2019-02-16 ENCOUNTER — Encounter: Payer: Self-pay | Admitting: Neurology

## 2019-02-16 VITALS — BP 138/75 | HR 58 | Temp 96.8°F | Ht 64.0 in | Wt 216.5 lb

## 2019-02-16 DIAGNOSIS — R269 Unspecified abnormalities of gait and mobility: Secondary | ICD-10-CM | POA: Diagnosis not present

## 2019-02-16 DIAGNOSIS — R202 Paresthesia of skin: Secondary | ICD-10-CM

## 2019-02-16 NOTE — Progress Notes (Signed)
PATIENT: Julie [name] DOB: 1932-11-07  Chief Complaint  Patient presents with  . Numbness    She is here with her daughter, [name], to review her MRI findings.     HISTORICAL  Julie [name] is a 85 year old female, seen in request by her primary care physician Dr. [name], [place], for evaluation of bilateral hands and feet paresthesia, initial evaluation was on January 09, 2019.  I have reviewed and summarized the referring note from the referring physician.  She had past medical history of hypertension, hyperlipidemia, left breast cancer, status post left lobectomy, chemotherapy, in 2017, during that time, she developed bilateral fingertips and feet paresthesia, also had a history of chronic low back pain, carries a diagnosis of lumbar stenosis, has gradual onset gait abnormality  Over the past 3 years, she continue to experience worsening bilateral fingertips and feet paresthesia, numbness tingling stay at the bottom of her feet, and toes, she also complains of worsening low back pain, radiating pain to bilateral lower extremities, last week while driving, she described difficulty moving her right leg off the paddle, she also complains of bilateral hands tremor, difficulty holding on small object  I also personally reviewed hospital discharge summary on December 14, 2018, when she was admitted for generalized whole body achy pain, weakness, there was also described episode of staring spells at emergency room,  Echocardiogram December 14, 2018: Ejection fraction 60 to 65%, cavity size was normal, mild increased left ventricular wall thickness   I personally reviewed MRI of the brain without contrast on March 13, 2018: No acute abnormality, moderate supratentorium small vessel disease  MRI of lumbar in January 2018, multilevel degenerative disc disease, mild to moderate canal stenosis at L2-3, L3-4, mild at L4-5, with variable degree of neural foraminal narrowing, severe neural foraminal  narrowing at right L3-4, L4-5,   Laboratory evaluations in July 2020, normal TSH, CBC, CMP showed mild elevated glucose 115, normal CPK,  UPDATE Sept 24 2020: She has low back pain, no radiating pain to her leg, intermittent bilateral foot numbness, gait abnormality  We personally reviewed MRI cervical spine September 2020: Multilevel degenerative changes, evidence of anterior cervical discectomy and fusion at C 4, 5, 6  Laboratory evaluation August 2020 showed negative HIV, RPR, A1c of 6.0, mild elevated M spike 0.8 DL on protein electrophoresis, normal B12, C-reactive protein,  EMG nerve conduction study in August 2020 showed no large fiber peripheral neuropathy  REVIEW OF SYSTEMS: Full 14 system review of systems performed and notable only for as above All other review of systems were negative.  ALLERGIES: Allergies  Allergen Reactions  . Codeine Shortness Of Breath  . Other     Walnuts-anaphylaxis (patient denies allergy states that she just does not eat WALNUTS)  . Aspirin Other (See Comments)    Duodenal ulcer- cannot take aspirin when active.  HYPERSENSITIVE  TO  REGULAR DOSE.      HOME MEDICATIONS: Current Outpatient Medications  Medication Sig Dispense Refill  . amLODipine (NORVASC) 10 MG tablet Take 10 mg by mouth every morning.     . folic acid (FOLVITE) 1 MG tablet Take 2 mg by mouth every morning.     [name] FREESTYLE LITE test strip TEST ONCE EVERY DAY  4  . hydroxychloroquine (PLAQUENIL) 200 MG tablet Take 200 mg by mouth daily.    [name] lovastatin (MEVACOR) 40 MG tablet 1 (ONE) TABLET TAKE IN THE EVENING AFTER DINNER (Patient taking differently: Take 40 mg by mouth every evening. )  90 tablet 1  . metoprolol succinate (TOPROL-XL) 50 MG 24 hr tablet Take 50 mg by mouth every evening.   2  . traMADol (ULTRAM) 50 MG tablet Take 100 mg by mouth 3 (three) times daily as needed.    . valsartan-hydrochlorothiazide (DIOVAN-HCT) 320-12.5 MG per tablet Take 1 tablet by mouth daily.     . vitamin E 400 UNIT capsule Take 400 Units by mouth daily.    . pregabalin (LYRICA) 25 MG capsule Take 1 capsule (25 mg total) by mouth 3 (three) times daily. 90 capsule 0   No current facility-administered medications for this visit.     PAST MEDICAL HISTORY: Past Medical History:  Diagnosis Date  . Anemia   . Anxiety    takes Klonopin at bedtime  . Arthritis    takes Methotrexate weekly and Prednisone  . Blood transfusion    no abnormal reaction noted  . Cancer (HCC)    breast   . Cataracts, bilateral    immature  . Complication of anesthesia    wakes up during surgery  . Depression   . Diabetes mellitus without complication ([place])   . Dyspnea    with exertion since chemo   . Fibromyalgia    takes Lyrica daily  . GERD (gastroesophageal reflux disease)    takes Nexium daily  . Glaucoma   . Hard of hearing   . Heart murmur   . Histoplasmosis   . History of colon polyps   . History of gout   . History of hiatal hernia   . History of shingles   . Hyperlipidemia   . Hypertension    takes Amlodipine,Metoprolol,and Losartan daily  . Incontinence of bowel   . Interstitial cystitis   . Joint pain   . Low back pain   . Nocturia   . Peripheral neuropathy   . Peripheral neuropathy   . Personal history of chemotherapy   . Personal history of radiation therapy   . PONV (postoperative nausea and vomiting)   . Ulcer 1970   duodenal  . Urinary frequency   . Urinary urgency   . Weakness    numbness in both hands and feet    PAST SURGICAL HISTORY: Past Surgical History:  Procedure Laterality Date  . ABDOMINAL HYSTERECTOMY    . accupuncture  3 yrs ago  . ANKLE FUSION Left 05/10/2014   Procedure: LEFT ANKLE ARTHRODESIS ;  Surgeon: Wylene Simmer, MD;  Location: [facility];  Service: Orthopedics;  Laterality: Left;  . ANKLE SURGERY Left   . arm surgery Left    radius  . bladder tacked     . BREAST LUMPECTOMY Left   . CERVICAL FUSION    . CHOLECYSTECTOMY    .  COLONOSCOPY    . CYSTOCELE REPAIR    . DILATION AND CURETTAGE OF UTERUS    . ESOPHAGOGASTRODUODENOSCOPY    . PORT A CATH REVISION N/A 12/27/2015   Procedure: PORT A CATH REVISION;  Surgeon: Rolm Bookbinder, MD;  Location: [facility];  Service: General;  Laterality: N/A;  . PORT-A-CATH REMOVAL N/A 03/25/2016   Procedure: REMOVAL PORT-A-CATH;  Surgeon: Rolm Bookbinder, MD;  Location: [facility];  Service: General;  Laterality: N/A;  . PORTACATH PLACEMENT Right 12/27/2015   Procedure: INSERTION PORT-A-CATH WITH ULTRASOUND ;  Surgeon: Rolm Bookbinder, MD;  Location: [facility];  Service: General;  Laterality: Right;  . RADIOACTIVE SEED GUIDED PARTIAL MASTECTOMY WITH AXILLARY SENTINEL LYMPH NODE BIOPSY Left 11/14/2015  Procedure: RADIOACTIVE SEED GUIDED LEFT BREAST LUMPECTOMY WITH AXILLARY SENTINEL LYMPH NODE BIOPSY;  Surgeon: Rolm Bookbinder, MD;  Location: [facility];  Service: General;  Laterality: Left;  RADIOACTIVE SEED GUIDED LEFT BREAST LUMPECTOMY WITH AXILLARY SENTINEL LYMPH NODE BIOPSY  . SALPINGOOPHORECTOMY Bilateral   . TOTAL KNEE ARTHROPLASTY Right 02/28/2015   Procedure: RIGHT TOTAL KNEE ARTHROPLASTY;  Surgeon: Rod Can, MD;  Location: [facility];  Service: Orthopedics;  Laterality: Right;  . UPPER GASTROINTESTINAL ENDOSCOPY    . VAGINAL HYSTERECTOMY      FAMILY HISTORY: Family History  Problem Relation Age of Onset  . Colon cancer Sister 63  . Esophageal cancer Brother 44  . Breast cancer Mother   . Brain cancer Other   . Other Father        cerebral hemorrhage    SOCIAL HISTORY: Social History   Socioeconomic History  . Marital status: Divorced    Spouse name: Not on file  . Number of children: 5  . Years of education: some college  . Highest education level: Not on file  Occupational History  . Occupation: Retired  Scientific laboratory technician  . Financial resource strain: Not on file  . Food insecurity    Worry: Not on file     Inability: Not on file  . Transportation needs    Medical: Not on file    Non-medical: Not on file  Tobacco Use  . Smoking status: Never Smoker  . Smokeless tobacco: Never Used  Substance and Sexual Activity  . Alcohol use: No    Comment: IN [place] ONLY  . Drug use: No  . Sexual activity: Not on file  Lifestyle  . Physical activity    Days per week: Not on file    Minutes per session: Not on file  . Stress: Not on file  Relationships  . Social Herbalist on phone: Not on file    Gets together: Not on file    Attends religious service: Not on file    Active member of club or organization: Not on file    Attends meetings of clubs or organizations: Not on file    Relationship status: Not on file  . Intimate partner violence    Fear of current or ex partner: Not on file    Emotionally abused: Not on file    Physically abused: Not on file    Forced sexual activity: Not on file  Other Topics Concern  . Not on file  Social History Narrative   Lives at home with her daughter.   Right-handed.   0.5 cup caffeine per day.     PHYSICAL EXAM   Vitals:   02/16/19 0900  BP: 138/75  Pulse: (!) 58  Temp: (!) 96.8 F (36 C)  Weight: 216 lb 8 oz (98.2 kg)  Height: 5\' 4"  (1.626 m)    Not recorded      Body mass index is 37.16 kg/m.  PHYSICAL EXAMNIATION:  Gen: NAD, conversant, well nourised, obese, well groomed                     Cardiovascular: Regular rate rhythm, no peripheral edema, warm, nontender. Eyes: Conjunctivae clear without exudates or hemorrhage Neck: Supple, no carotid bruits. Pulmonary: Clear to auscultation bilaterally   NEUROLOGICAL EXAM:  MENTAL STATUS: Speech:    Speech is normal; fluent and spontaneous with normal comprehension.  Cognition:     Orientation to time, place and person  Normal recent and remote memory     Normal Attention span and concentration     Normal Language, naming, repeating,spontaneous speech     Fund of  knowledge   CRANIAL NERVES: CN II: Visual fields are full to confrontation  Pupils are round equal and briskly reactive to light. CN III, IV, VI: extraocular movement are normal. No ptosis. CN V: Facial sensation is intact to pinprick in all 3 divisions bilaterally. Corneal responses are intact.  CN VII: Face is symmetric with normal eye closure and smile. CN VIII: Hearing is normal to casual conversation bilaterally CN IX, X: Palate elevates symmetrically. Phonation is normal. CN XI: Head turning and shoulder shrug are intact CN XII: Tongue is midline with normal movements and no atrophy.  MOTOR: There is no pronator drift of out-stretched arms. Muscle bulk and tone are normal. Muscle strength is normal.  REFLEXES: Reflexes are 1 and symmetric at the biceps, triceps, knees, and ankles. Plantar responses are flexor.  SENSORY: Length dependent decreased to light touch, and vibratory sensation to ankle level  COORDINATION: Rapid alternating movements and fine finger movements are intact. There is no dysmetria on finger-to-nose and heel-knee-shin.    GAIT/STANCE: She needs push-up to get up from seated position, antalgic, unsteady  DIAGNOSTIC DATA (LABS, IMAGING, TESTING) - I reviewed patient records, labs, notes, testing and imaging myself where available.   ASSESSMENT AND PLAN  Julie [name] is a 85 y.o. female   Bilateral upper and lower extremity paresthesia Gait abnormalities  No evidence of large fiber peripheral neuropathy on EMG nerve conduction study  Repeat MRI of cervical spine showed previous surgery, but no evidence of cord or foraminal narrowing  Her gait abnormality most likely due to deconditioning, low back pain, obesity,  Refer her to physical therapy    Marcial Pacas, M.D. Ph.D.  [facility] [address] [place], [place] [address] Ph: [phone] Fax: [phone]  CC: [name], DO

## 2019-02-21 ENCOUNTER — Ambulatory Visit: Payer: Self-pay | Admitting: Neurology

## 2019-02-23 ENCOUNTER — Ambulatory Visit: Payer: Medicare Other | Attending: Neurology | Admitting: Physical Therapy

## 2019-02-23 ENCOUNTER — Other Ambulatory Visit: Payer: Self-pay

## 2019-02-23 ENCOUNTER — Encounter: Payer: Self-pay | Admitting: Physical Therapy

## 2019-02-23 DIAGNOSIS — R269 Unspecified abnormalities of gait and mobility: Secondary | ICD-10-CM | POA: Diagnosis not present

## 2019-02-23 DIAGNOSIS — M6281 Muscle weakness (generalized): Secondary | ICD-10-CM | POA: Diagnosis not present

## 2019-02-23 DIAGNOSIS — R2681 Unsteadiness on feet: Secondary | ICD-10-CM | POA: Diagnosis not present

## 2019-02-23 DIAGNOSIS — R2689 Other abnormalities of gait and mobility: Secondary | ICD-10-CM | POA: Insufficient documentation

## 2019-02-24 NOTE — Therapy (Signed)
[facility] [address] [place], [name], [address] Phone: [phone]   Fax:  [phone]  Physical Therapy Evaluation  Patient Details  Name: Julie [name] MRN: [id number] Date of Birth: [birth year] Referring Provider (PT): Dr. Marcial Pacas   Encounter Date: 02/23/2019  PT End of Session - 02/24/19 2040    Visit Number  1    Number of Visits  9    Date for PT Re-Evaluation  03/26/19    Authorization Type  Medicare/Aetna    Authorization Time Period  02-23-19 - 05-24-19    PT Start Time  0800    PT Stop Time  0848    PT Time Calculation (min)  48 min    Activity Tolerance  Patient tolerated treatment well    Behavior During Therapy  [facility] for tasks assessed/performed       Past Medical History:  Diagnosis Date  . Anemia   . Anxiety    takes Klonopin at bedtime  . Arthritis    takes Methotrexate weekly and Prednisone  . Blood transfusion    no abnormal reaction noted  . Cancer (HCC)    breast   . Cataracts, bilateral    immature  . Complication of anesthesia    wakes up during surgery  . Depression   . Diabetes mellitus without complication ([place])   . Dyspnea    with exertion since chemo   . Fibromyalgia    takes Lyrica daily  . GERD (gastroesophageal reflux disease)    takes Nexium daily  . Glaucoma   . Hard of hearing   . Heart murmur   . Histoplasmosis   . History of colon polyps   . History of gout   . History of hiatal hernia   . History of shingles   . Hyperlipidemia   . Hypertension    takes Amlodipine,Metoprolol,and Losartan daily  . Incontinence of bowel   . Interstitial cystitis   . Joint pain   . Low back pain   . Nocturia   . Peripheral neuropathy   . Peripheral neuropathy   . Personal history of chemotherapy   . Personal history of radiation therapy   . PONV (postoperative nausea and vomiting)   . Ulcer 1970   duodenal  . Urinary frequency   . Urinary urgency   . Weakness     numbness in both hands and feet    Past Surgical History:  Procedure Laterality Date  . ABDOMINAL HYSTERECTOMY    . accupuncture  3 yrs ago  . ANKLE FUSION Left 05/10/2014   Procedure: LEFT ANKLE ARTHRODESIS ;  Surgeon: Wylene Simmer, MD;  Location: [facility];  Service: Orthopedics;  Laterality: Left;  . ANKLE SURGERY Left   . arm surgery Left    radius  . bladder tacked     . BREAST LUMPECTOMY Left   . CERVICAL FUSION    . CHOLECYSTECTOMY    . COLONOSCOPY    . CYSTOCELE REPAIR    . DILATION AND CURETTAGE OF UTERUS    . ESOPHAGOGASTRODUODENOSCOPY    . PORT A CATH REVISION N/A 12/27/2015   Procedure: PORT A CATH REVISION;  Surgeon: Rolm Bookbinder, MD;  Location: [facility];  Service: General;  Laterality: N/A;  . PORT-A-CATH REMOVAL N/A 03/25/2016   Procedure: REMOVAL PORT-A-CATH;  Surgeon: Rolm Bookbinder, MD;  [facility];  Service: General;  Laterality: N/A;  . PORTACATH PLACEMENT Right 12/27/2015   Procedure: INSERTION PORT-A-CATH WITH  ULTRASOUND ;  Surgeon: Rolm Bookbinder, MD;  Location: [facility];  Service: General;  Laterality: Right;  . RADIOACTIVE SEED GUIDED PARTIAL MASTECTOMY WITH AXILLARY SENTINEL LYMPH NODE BIOPSY Left 11/14/2015   Procedure: RADIOACTIVE SEED GUIDED LEFT BREAST LUMPECTOMY WITH AXILLARY SENTINEL LYMPH NODE BIOPSY;  Surgeon: Rolm Bookbinder, MD;  Location: [facility];  Service: General;  Laterality: Left;  RADIOACTIVE SEED GUIDED LEFT BREAST LUMPECTOMY WITH AXILLARY SENTINEL LYMPH NODE BIOPSY  . SALPINGOOPHORECTOMY Bilateral   . TOTAL KNEE ARTHROPLASTY Right 02/28/2015   Procedure: RIGHT TOTAL KNEE ARTHROPLASTY;  Surgeon: Rod Can, MD;  [facility];  Service: Orthopedics;  Laterality: Right;  . UPPER GASTROINTESTINAL ENDOSCOPY    . VAGINAL HYSTERECTOMY      There were no vitals filed for this visit.       [facility] PT Assessment - 02/24/19 0001      Assessment   Medical Diagnosis  Gait  Instability: Paresthesias    Referring Provider (PT)  Dr. Marcial Pacas    Onset Date/Surgical Date  --   approx. 2 yrs ago for decline in mobility; referral 01-09-19     Precautions   Precautions  Fall      Balance Screen   Has the patient fallen in the past 6 months  No    Has the patient had a decrease in activity level because of a fear of falling?   No    Is the patient reluctant to leave their home because of a fear of falling?   No      Home Environment   Living Environment  Private residence    Type of [place] Access  Stairs to enter    Entrance Stairs-Number of Steps  3    Entrance Stairs-Rails  Left    Home Layout  Two level    Alternate Level Stairs-Number of Steps  12    Home Equipment  Shower seat      Prior Function   Level of Independence  Independent with basic ADLs;Independent with household mobility with device;Independent with community mobility with device;Needs assistance with homemaking      ROM / Strength   AROM / PROM / Strength  Strength      Strength   Overall Strength  Within functional limits for tasks performed    Overall Strength Comments  c/o pain in low back and RLE with resistance    Strength Assessment Site  Hip;Knee;Ankle      Transfers   Transfers  Sit to Stand;Stand to Sit    Number of Reps  Other reps (comment)   2   Comments  UE support needed from mat table      Ambulation/Gait   Ambulation/Gait  Yes    Ambulation/Gait Assistance  5: Supervision    Ambulation Distance (Feet)  100 Feet    Assistive device  Straight cane    Gait Pattern  Step-through pattern;Antalgic;Lateral trunk lean to left   due to c/o pain in RLE   Ambulation Surface  Level;Indoor    Gait velocity  19.36 secs = 1.69 ft/sec with [facility]      Standardized Balance Assessment   Standardized Balance Assessment  Berg Balance Test;Timed Up and Go Test      Berg Balance Test   Sit to Stand  Able to stand using hands after several tries    Standing  Unsupported  Able to stand safely 2 minutes    Sitting with  Back Unsupported but Feet Supported on Floor or Stool  Able to sit safely and securely 2 minutes    Stand to Sit  Controls descent by using hands    Transfers  Able to transfer safely, minor use of hands    Standing Unsupported with Eyes Closed  Able to stand 10 seconds with supervision    Standing Unsupported with Feet Together  Able to place feet together independently and stand for 1 minute with supervision    From Standing, Reach Forward with Outstretched Arm  Can reach confidently >25 cm (10")    From Standing Position, Pick up Object from [place] to pick up shoe safely and easily    From Standing Position, Turn to Look Behind Over each Shoulder  Looks behind from both sides and weight shifts well    Turn 360 Degrees  Able to turn 360 degrees safely but slowly   R=8.72   Standing Unsupported, Alternately Place Feet on Step/Stool  Able to complete >2 steps/needs minimal assist    Standing Unsupported, One Foot in Front  Able to plae foot ahead of the other independently and hold 30 seconds    Standing on One Leg  Unable to try or needs assist to prevent fall    Total Score  41      Timed Up and Go Test   Normal TUG (seconds)  18.35   with SPC               Objective measurements completed on examination: See above findings.              PT Education - 02/24/19 2038    Education Details  eval results; discussed need for RW (rollator? with seat) for rest periods prn    Person(s) Educated  Patient    Methods  Explanation    Comprehension  Verbalized understanding       PT Short Term Goals - 02/24/19 2049      PT SHORT TERM GOAL #1   Title  same as LTG's        PT Long Term Goals - 02/24/19 2049      PT LONG TERM GOAL #1   Title  Improve Berg balance test score from 41/56 to >/= 46/56 to decrease fall risk.    Baseline  41/56 on 02-23-19    Time  4    Period  Weeks    Status  New     Target Date  03/26/19      PT LONG TERM GOAL #2   Title  Increase gait velocity from 1.69 ft/sec with SPC to >/= 2.1 ft/sec with SPC for incr. gait efficiency.    Time  4    Period  Weeks    Status  New    Target Date  03/26/19      PT LONG TERM GOAL #3   Title  Improve TUG score from 18.35 secs to </= 15.0 secs with SPC for reduced fall risk.    Baseline  18.35 secs with SPC - 02-23-19    Time  4    Period  Weeks    Status  New    Target Date  03/26/19      PT LONG TERM GOAL #4   Title  Pt will amb.300' with RW (or rollator) with supervision for incr. community accessibility.    Baseline  100' with SPC on 02-23-19    Time  4    Period  Weeks    Status  New    Target Date  03/26/19      PT LONG TERM GOAL #5   Title  Independent in HEP for balance and strengthening exs.    Time  4    Period  Weeks    Status  New    Target Date  03/26/19             Plan - 02/24/19 2041    Clinical Impression Statement  Pt is an 85 yr old lady with paresthesias, DM with peripheral neuropathy, fibromyalgia, gait instability, chronic pain and balance impairment.  Pt is at risk for fall per TUG score of 18.35 secs; pt is using a SPC for assistance with ambulation but needs a RW for safety and stability with ambulation.  Pt will benefit from PT to address strength, gait and balance deficits.    Personal Factors and Comorbidities  Behavior Pattern;Fitness;Comorbidity 2;Time since onset of injury/illness/exacerbation;Transportation    Comorbidities  h/o breast cancer, DM, fibromyalgia, HTN, periperal neuropathy, and dyspnea; OA; chronic low back pain    Examination-Activity Limitations  Stairs;Stand;Squat;Locomotion Level;Transfers    Examination-Participation Restrictions  Meal Prep;Cleaning;Community Activity;Interpersonal Relationship;Shop    Stability/Clinical Decision Making  Evolving/Moderate complexity    Clinical Decision Making  Moderate    Rehab Potential  Good    PT Frequency  2x /  week    PT Duration  4 weeks   will renew if pt makes progress   PT Treatment/Interventions  ADLs/Self Care Home Management;Aquatic Therapy;DME Instruction;Gait training;Stair training;Therapeutic activities;Therapeutic exercise;Balance training;Neuromuscular re-education;Patient/family education;Manual techniques;Moist Heat    PT Next Visit Plan  assess gait with rollator vs. standard RW; request script for device selected; begin balance HEP    PT Home Exercise Plan  balance    Consulted and Agree with Plan of Care  Patient       Patient will benefit from skilled therapeutic intervention in order to improve the following deficits and impairments:  Abnormal gait, Decreased activity tolerance, Decreased balance, Decreased strength, Pain, Impaired sensation  Visit Diagnosis: Muscle weakness (generalized) - Plan: PT plan of care cert/re-cert  Other abnormalities of gait and mobility - Plan: PT plan of care cert/re-cert  Unsteadiness on feet - Plan: PT plan of care cert/re-cert     Problem List Patient Active Problem List   Diagnosis Date Noted  . Gait abnormality 01/09/2019  . Paresthesia 01/09/2019  . Near syncope   . Weakness   . Essential hypertension   . Generalized weakness 12/13/2018  . Syncope 12/13/2018  . Peripheral neuropathy 06/08/2016  . Low back pain without sciatica 06/08/2016  . Abnormality of gait 06/08/2016  . Anemia due to antineoplastic chemotherapy 03/06/2016  . Hypersensitivity reaction 01/07/2016  . Port catheter in place 01/03/2016  . Malignant neoplasm of upper outer quadrant of female breast ([place]) 10/30/2015  . Primary osteoarthritis of right knee 02/28/2015  . Post-traumatic arthritis of ankle 05/10/2014  . MGUS (monoclonal gammopathy of unknown significance) 02/17/2012  . Diverticulosis of colon (without mention of hemorrhage) 01/21/2011  . Family history of malignant neoplasm of gastrointestinal tract 01/21/2011  . Special screening for malignant  neoplasms, colon 01/21/2011  . Other symptoms involving digestive system(787.99) 01/21/2011    Jordain Radin, [place], PT 02/24/2019, 9:02 PM  [facility] [address] [place], [name], [address] Phone: [phone]   Fax:  [phone]  Name: LUCILIA MICHAL MRN: [id number] Date of Birth: 1932-08-27

## 2019-02-28 ENCOUNTER — Ambulatory Visit: Payer: Medicare Other | Admitting: Physical Therapy

## 2019-03-01 ENCOUNTER — Other Ambulatory Visit: Payer: Self-pay

## 2019-03-01 ENCOUNTER — Encounter (HOSPITAL_COMMUNITY): Payer: Self-pay | Admitting: Emergency Medicine

## 2019-03-01 ENCOUNTER — Emergency Department (HOSPITAL_COMMUNITY)
Admission: EM | Admit: 2019-03-01 | Discharge: 2019-03-01 | Disposition: A | Discharge status: Home / Self Care | Payer: Medicare Other | Attending: Emergency Medicine | Admitting: Emergency Medicine

## 2019-03-01 DIAGNOSIS — E1142 Type 2 diabetes mellitus with diabetic polyneuropathy: Secondary | ICD-10-CM | POA: Insufficient documentation

## 2019-03-01 DIAGNOSIS — Z79899 Other long term (current) drug therapy: Secondary | ICD-10-CM | POA: Diagnosis not present

## 2019-03-01 DIAGNOSIS — M47812 Spondylosis without myelopathy or radiculopathy, cervical region: Secondary | ICD-10-CM | POA: Insufficient documentation

## 2019-03-01 DIAGNOSIS — I10 Essential (primary) hypertension: Secondary | ICD-10-CM | POA: Insufficient documentation

## 2019-03-01 DIAGNOSIS — R52 Pain, unspecified: Secondary | ICD-10-CM | POA: Diagnosis not present

## 2019-03-01 DIAGNOSIS — M542 Cervicalgia: Secondary | ICD-10-CM

## 2019-03-01 MED ORDER — PREDNISONE 20 MG PO TABS
40.0000 mg | ORAL_TABLET | Freq: Once | ORAL | Status: AC
  Administered 2019-03-01: 40 mg via ORAL
  Filled 2019-03-01: qty 2

## 2019-03-01 MED ORDER — TRAMADOL HCL 50 MG PO TABS: 50.0000 mg | ORAL_TABLET | Freq: Four times a day (QID) | ORAL | 1 refills | Status: DC | PRN

## 2019-03-01 MED ORDER — PREDNISONE 20 MG PO TABS: 20.0000 mg | ORAL_TABLET | Freq: Two times a day (BID) | ORAL | 0 refills | Status: DC

## 2019-03-01 MED ORDER — TRAMADOL HCL 50 MG PO TABS
50.0000 mg | ORAL_TABLET | Freq: Once | ORAL | Status: AC
  Administered 2019-03-01: 50 mg via ORAL
  Filled 2019-03-01: qty 1

## 2019-03-01 NOTE — ED Provider Notes (Signed)
[facility] DEPT Provider Note   CSN: [id number] Arrival date & time: 03/01/19  [id number]     History   Chief Complaint Chief Complaint  Patient presents with  . Neck Pain    HPI Julie [name] is a 85 y.o. female.     HPI   She presents by EMS, from home for evaluation of atraumatic neck pain radiating to her head, present for 2 days.  She is worried that her cervical spinal stenosis is gotten worse.  She also has bilateral hip pain.  She is actively receiving physical therapy for gait disorder and seizures the legs, initiated by neurology.  She is taking her usual medications without relief.  She denies fever, chills, nausea or vomiting.  There are no other known modifying factors.  Past Medical History:  Diagnosis Date  . Anemia   . Anxiety    takes Klonopin at bedtime  . Arthritis    takes Methotrexate weekly and Prednisone  . Blood transfusion    no abnormal reaction noted  . Cancer (HCC)    breast   . Cataracts, bilateral    immature  . Complication of anesthesia    wakes up during surgery  . Depression   . Diabetes mellitus without complication ([place])   . Dyspnea    with exertion since chemo   . Fibromyalgia    takes Lyrica daily  . GERD (gastroesophageal reflux disease)    takes Nexium daily  . Glaucoma   . Hard of hearing   . Heart murmur   . Histoplasmosis   . History of colon polyps   . History of gout   . History of hiatal hernia   . History of shingles   . Hyperlipidemia   . Hypertension    takes Amlodipine,Metoprolol,and Losartan daily  . Incontinence of bowel   . Interstitial cystitis   . Joint pain   . Low back pain   . Nocturia   . Peripheral neuropathy   . Peripheral neuropathy   . Personal history of chemotherapy   . Personal history of radiation therapy   . PONV (postoperative nausea and vomiting)   . Ulcer 1970   duodenal  . Urinary frequency   . Urinary urgency   . Weakness    numbness in both  hands and feet    Patient Active Problem List   Diagnosis Date Noted  . Gait abnormality 01/09/2019  . Paresthesia 01/09/2019  . Near syncope   . Weakness   . Essential hypertension   . Generalized weakness 12/13/2018  . Syncope 12/13/2018  . Peripheral neuropathy 06/08/2016  . Low back pain without sciatica 06/08/2016  . Abnormality of gait 06/08/2016  . Anemia due to antineoplastic chemotherapy 03/06/2016  . Hypersensitivity reaction 01/07/2016  . Port catheter in place 01/03/2016  . Malignant neoplasm of upper outer quadrant of female breast ([place]) 10/30/2015  . Primary osteoarthritis of right knee 02/28/2015  . Post-traumatic arthritis of ankle 05/10/2014  . MGUS (monoclonal gammopathy of unknown significance) 02/17/2012  . Diverticulosis of colon (without mention of hemorrhage) 01/21/2011  . Family history of malignant neoplasm of gastrointestinal tract 01/21/2011  . Special screening for malignant neoplasms, colon 01/21/2011  . Other symptoms involving digestive system(787.99) 01/21/2011    Past Surgical History:  Procedure Laterality Date  . ABDOMINAL HYSTERECTOMY    . accupuncture  3 yrs ago  . ANKLE FUSION Left 05/10/2014   Procedure: LEFT ANKLE ARTHRODESIS ;  Surgeon: Wylene Simmer, MD;  Location: [facility];  Service: Orthopedics;  Laterality: Left;  . ANKLE SURGERY Left   . arm surgery Left    radius  . bladder tacked     . BREAST LUMPECTOMY Left   . CERVICAL FUSION    . CHOLECYSTECTOMY    . COLONOSCOPY    . CYSTOCELE REPAIR    . DILATION AND CURETTAGE OF UTERUS    . ESOPHAGOGASTRODUODENOSCOPY    . PORT A CATH REVISION N/A 12/27/2015   Procedure: PORT A CATH REVISION;  Surgeon: Rolm Bookbinder, MD;  Location: [facility];  Service: General;  Laterality: N/A;  . PORT-A-CATH REMOVAL N/A 03/25/2016   Procedure: REMOVAL PORT-A-CATH;  Surgeon: Rolm Bookbinder, MD;  [facility];  Service: General;  Laterality: N/A;  . PORTACATH PLACEMENT Right  12/27/2015   Procedure: INSERTION PORT-A-CATH WITH ULTRASOUND ;  Surgeon: Rolm Bookbinder, MD;  Location: [facility];  Service: General;  Laterality: Right;  . RADIOACTIVE SEED GUIDED PARTIAL MASTECTOMY WITH AXILLARY SENTINEL LYMPH NODE BIOPSY Left 11/14/2015   Procedure: RADIOACTIVE SEED GUIDED LEFT BREAST LUMPECTOMY WITH AXILLARY SENTINEL LYMPH NODE BIOPSY;  Surgeon: Rolm Bookbinder, MD;  Location: [facility];  Service: General;  Laterality: Left;  RADIOACTIVE SEED GUIDED LEFT BREAST LUMPECTOMY WITH AXILLARY SENTINEL LYMPH NODE BIOPSY  . SALPINGOOPHORECTOMY Bilateral   . TOTAL KNEE ARTHROPLASTY Right 02/28/2015   Procedure: RIGHT TOTAL KNEE ARTHROPLASTY;  Surgeon: Rod Can, MD;  [facility];  Service: Orthopedics;  Laterality: Right;  . UPPER GASTROINTESTINAL ENDOSCOPY    . VAGINAL HYSTERECTOMY       OB History   No obstetric history on file.      Home Medications    Prior to Admission medications   Medication Sig Start Date End Date Taking? Authorizing Provider  amLODipine (NORVASC) 10 MG tablet Take 10 mg by mouth every morning.    Yes [provider]  folic acid (FOLVITE) 1 MG tablet Take 2 mg by mouth every morning.    Yes [provider]  hydroxychloroquine (PLAQUENIL) 200 MG tablet Take 200 mg by mouth daily. 12/27/18  Yes [provider]  lovastatin (MEVACOR) 40 MG tablet 1 (ONE) TABLET TAKE IN THE EVENING AFTER DINNER Patient taking differently: Take 40 mg by mouth every evening.  08/12/18  Yes Vyas, Sylvan Cheese, MD  metoprolol succinate (TOPROL-XL) 50 MG 24 hr tablet Take 50 mg by mouth every evening.  08/29/15  Yes [provider]  pregabalin (LYRICA) 75 MG capsule Take 75 mg by mouth 2 (two) times daily.   Yes [provider]  valsartan-hydrochlorothiazide (DIOVAN-HCT) 320-12.5 MG per tablet Take 1 tablet by mouth daily.   Yes [provider]  FREESTYLE LITE test strip TEST ONCE EVERY DAY  01/27/17   [provider]  predniSONE (DELTASONE) 20 MG tablet Take 1 tablet (20 mg total) by mouth 2 (two) times daily. 03/01/19   Daleen Bo, MD  pregabalin (LYRICA) 25 MG capsule Take 1 capsule (25 mg total) by mouth 3 (three) times daily. Patient not taking: Reported on 03/01/2019 03/13/18 03/01/19  Duffy Bruce, MD  traMADol (ULTRAM) 50 MG tablet Take 1 tablet (50 mg total) by mouth every 6 (six) hours as needed for moderate pain. 03/01/19   Daleen Bo, MD    Family History Family History  Problem Relation Age of Onset  . Colon cancer Sister 52  . Esophageal cancer Brother 2  . Breast cancer Mother   . Brain cancer Other   .  Other Father        cerebral hemorrhage    Social History Social History   Tobacco Use  . Smoking status: Never Smoker  . Smokeless tobacco: Never Used  Substance Use Topics  . Alcohol use: No    Comment: IN [facility] ONLY  . Drug use: No     Allergies   Codeine, Other, and Aspirin   Review of Systems Review of Systems  All other systems reviewed and are negative.    Physical Exam Updated Vital Signs BP (!) 134/91   Pulse 67   Resp 17   SpO2 96%   Physical Exam Vitals signs and nursing note reviewed.  Constitutional:      Appearance: She is well-developed.  HENT:     Head: Normocephalic and atraumatic.     Right Ear: External ear normal.     Left Ear: External ear normal.  Eyes:     Conjunctiva/sclera: Conjunctivae normal.     Pupils: Pupils are equal, round, and reactive to light.  Neck:     Musculoskeletal: Normal range of motion and neck supple.     Trachea: Phonation normal.  Cardiovascular:     Rate and Rhythm: Normal rate.  Pulmonary:     Effort: Pulmonary effort is normal.  Musculoskeletal:        General: No swelling or tenderness.     Comments: Somewhat limited range of motion neck lateral bending, rotation, and flexion secondary to pain.  Good strength hands, legs and feet bilaterally  Skin:     General: Skin is warm and dry.  Neurological:     Mental Status: She is alert and oriented to person, place, and time.     Cranial Nerves: No cranial nerve deficit.     Sensory: No sensory deficit.     Motor: No abnormal muscle tone.     Coordination: Coordination normal.  Psychiatric:        Mood and Affect: Mood normal.        Behavior: Behavior normal.        Thought Content: Thought content normal.        Judgment: Judgment normal.      ED Treatments / Results  Labs (all labs ordered are listed, but only abnormal results are displayed) Labs Reviewed - No data to display  EKG None  Radiology No results found.  Procedures Procedures (including critical care time)  Medications Ordered in ED Medications  traMADol (ULTRAM) tablet 50 mg (50 mg Oral Given 03/01/19 1300)  predniSONE (DELTASONE) tablet 40 mg (40 mg Oral Given 03/01/19 1303)     Initial Impression / Assessment and Plan / ED Course  I have reviewed the triage vital signs and the nursing notes.  Pertinent labs & imaging results that were available during my care of the patient were reviewed by me and considered in my medical decision making (see chart for details).         Patient Vitals for the past 24 hrs:  BP Pulse Resp SpO2  03/01/19 1600 (!) 134/91 67 17 96 %  03/01/19 1500 (!) 141/75 69 18 96 %  03/01/19 1300 140/73 69 17 96 %    4:44 PM Reevaluation with update and discussion. After initial assessment and treatment, an updated evaluation reveals patient reports mild improvement of pain, with current treatment.  Extensive conversation with the patient and her daughter who is now here.  All questions answered. Daleen Bo   Medical Decision Making: Neck pain likely  from cervical arthritis.  Doubt cervical myelopathy, cervical fracture or muscle spasm as cause.  No indication for further ED treatment at this time  CRITICAL CARE-no Performed by: Daleen Bo  Nursing Notes Reviewed/ Care  Coordinated Applicable Imaging Reviewed Interpretation of Laboratory Data incorporated into ED treatment  The patient appears reasonably screened and/or stabilized for discharge and I doubt any other medical condition or other [facility] requiring further screening, evaluation, or treatment in the ED at this time prior to discharge.  Plan: Home Medications-continue usual, increase tramadol to 4 times a day; Home Treatments-heat treatment for neck pain; return here if the recommended treatment, does not improve the symptoms; Recommended follow up-PCP or neurology, PRN for further care of the neck.   Final Clinical Impressions(s) / ED Diagnoses   Final diagnoses:  Neck pain  Spondylosis of cervical region without myelopathy or radiculopathy    ED Discharge Orders         Ordered    predniSONE (DELTASONE) 20 MG tablet  2 times daily     03/01/19 1643    traMADol (ULTRAM) 50 MG tablet  Every 6 hours PRN     03/01/19 1643           Daleen Bo, MD 03/01/19 1645

## 2019-03-01 NOTE — ED Triage Notes (Signed)
Per [facility] from home for neck pains that's gotten worse over the past 3 days. Vitals: 141/82, 70HR, 16R, 98% on RA. 98.1 temp.

## 2019-03-01 NOTE — ED Notes (Signed)
ED Provider at bedside. 

## 2019-03-01 NOTE — Discharge Instructions (Signed)
The pain in your neck appears to be caused from arthritis in your neck.  To treat this we are prescribing prednisone, to treat inflammation.  We are also increasing her tramadol to 4 times a day, from 2 times a day, to improve your pain control.  Additionally, use heat on the sore area 4 or 5 times a day can help.  Follow-up with your primary care doctor or your neurologist for further care and treatment.

## 2019-03-02 ENCOUNTER — Ambulatory Visit: Payer: Medicare Other | Admitting: Physical Therapy

## 2019-03-02 ENCOUNTER — Encounter: Payer: Self-pay | Admitting: Physical Therapy

## 2019-03-02 DIAGNOSIS — M6281 Muscle weakness (generalized): Secondary | ICD-10-CM | POA: Diagnosis not present

## 2019-03-02 DIAGNOSIS — R2689 Other abnormalities of gait and mobility: Secondary | ICD-10-CM | POA: Diagnosis not present

## 2019-03-02 DIAGNOSIS — R269 Unspecified abnormalities of gait and mobility: Secondary | ICD-10-CM | POA: Diagnosis not present

## 2019-03-02 DIAGNOSIS — R2681 Unsteadiness on feet: Secondary | ICD-10-CM

## 2019-03-02 NOTE — Patient Instructions (Signed)
Access Code: [email or web address]  URL: [email or web address]  Date: 03/02/2019  Prepared by: Willow Ora   Exercises Sit to Stand with Armchair - 10 reps - 1 sets - 1x daily - 5x weekly Standing Hip Abduction with Counter Support - 10 reps - 1 sets - 1x daily - 5x weekly Heel rises with counter support - 10 reps - 1 sets - 1x daily - 5x weekly

## 2019-03-03 NOTE — Therapy (Signed)
[name], [name], [address] Phone: [phone]   Fax:  [phone]  Physical Therapy Treatment  Patient Details  Name: Julie [name] MRN: [id number] Date of Birth: Dec 17, 1932 Referring Provider (PT): Dr. Marcial Pacas   Encounter Date: 03/02/2019  PT End of Session - 03/02/19 1630    Visit Number  2    Number of Visits  9    Date for PT Re-Evaluation  03/26/19    Authorization Type  Medicare/[employer]    Authorization Time Period  02-23-19 - 05-24-19    PT Start Time  0718    PT Stop Time  0800    PT Time Calculation (min)  42 min    Equipment Utilized During Treatment  Gait belt    Activity Tolerance  Patient tolerated treatment well;No increased pain;Patient limited by fatigue    Behavior During Therapy  [facility] for tasks assessed/performed       Past Medical History:  Diagnosis Date  . Anemia   . Anxiety    takes Klonopin at bedtime  . Arthritis    takes Methotrexate weekly and Prednisone  . Blood transfusion    no abnormal reaction noted  . Cancer (HCC)    breast   . Cataracts, bilateral    immature  . Complication of anesthesia    wakes up during surgery  . Depression   . Diabetes mellitus without complication ([facility])   . Dyspnea    with exertion since chemo   . Fibromyalgia    takes Lyrica daily  . GERD (gastroesophageal reflux disease)    takes Nexium daily  . Glaucoma   . Hard of hearing   . Heart murmur   . Histoplasmosis   . History of colon polyps   . History of gout   . History of hiatal hernia   . History of shingles   . Hyperlipidemia   . Hypertension    takes Amlodipine,Metoprolol,and Losartan daily  . Incontinence of bowel   . Interstitial cystitis   . Joint pain   . Low back pain   . Nocturia   . Peripheral neuropathy   . Peripheral neuropathy   . Personal history of chemotherapy   . Personal history of radiation therapy   . PONV (postoperative nausea and  vomiting)   . Ulcer 1970   duodenal  . Urinary frequency   . Urinary urgency   . Weakness    numbness in both hands and feet    Past Surgical History:  Procedure Laterality Date  . ABDOMINAL HYSTERECTOMY    . accupuncture  3 yrs ago  . ANKLE FUSION Left 05/10/2014   Procedure: LEFT ANKLE ARTHRODESIS ;  Surgeon: Wylene Simmer, MD;  Location: [facility];  Service: Orthopedics;  Laterality: Left;  . ANKLE SURGERY Left   . arm surgery Left    radius  . bladder tacked     . BREAST LUMPECTOMY Left   . CERVICAL FUSION    . CHOLECYSTECTOMY    . COLONOSCOPY    . CYSTOCELE REPAIR    . DILATION AND CURETTAGE OF UTERUS    . ESOPHAGOGASTRODUODENOSCOPY    . PORT A CATH REVISION N/A 12/27/2015   Procedure: PORT A CATH REVISION;  Surgeon: Rolm Bookbinder, MD;  Location: [facility];  Service: General;  Laterality: N/A;  . PORT-A-CATH REMOVAL N/A 03/25/2016   Procedure: REMOVAL PORT-A-CATH;  Surgeon: Rolm Bookbinder, MD;  [facility];  Service: General;  Laterality: N/A;  . PORTACATH PLACEMENT Right 12/27/2015   Procedure: INSERTION PORT-A-CATH WITH ULTRASOUND ;  Surgeon: Rolm Bookbinder, MD;  Location: [facility];  Service: General;  Laterality: Right;  . RADIOACTIVE SEED GUIDED PARTIAL MASTECTOMY WITH AXILLARY SENTINEL LYMPH NODE BIOPSY Left 11/14/2015   Procedure: RADIOACTIVE SEED GUIDED LEFT BREAST LUMPECTOMY WITH AXILLARY SENTINEL LYMPH NODE BIOPSY;  Surgeon: Rolm Bookbinder, MD;  Location: [facility];  Service: General;  Laterality: Left;  RADIOACTIVE SEED GUIDED LEFT BREAST LUMPECTOMY WITH AXILLARY SENTINEL LYMPH NODE BIOPSY  . SALPINGOOPHORECTOMY Bilateral   . TOTAL KNEE ARTHROPLASTY Right 02/28/2015   Procedure: RIGHT TOTAL KNEE ARTHROPLASTY;  Surgeon: Rod Can, MD;  [facility];  Service: Orthopedics;  Laterality: Right;  . UPPER GASTROINTESTINAL ENDOSCOPY    . VAGINAL HYSTERECTOMY      There were no vitals filed for this  visit.  Subjective Assessment - 03/02/19 0719    Subjective  Was seen in ED yesterday for neck pain. Now on Predinsone and increased Tramadol. No new falls since evaluation. Neck pain is better today, hips still are painful.    Pertinent History  OA, h/o breast cancer, DM, fibromyalgia, HTN, peripheral neuropathy, dyspnea    Limitations  Standing;Walking;House hold activities    Diagnostic tests  MRI was done approx. 1 month ago    Patient Stated Goals  "I just want pain relief"    Pain Score  5     Pain Location  Neck    Pain Orientation  Left    Pain Descriptors / Indicators  Aching;Tightness;Dull    Pain Type  Acute pain    Pain Onset  In the past 7 days    Pain Frequency  Constant    Aggravating Factors   unknown    Pain Relieving Factors  predisone, tramadol, heat    Multiple Pain Sites  Yes    Pain Score  8    Pain Location  Hip    Pain Orientation  Right;Left   right > left   Pain Descriptors / Indicators  Aching    Pain Type  Chronic pain    Pain Radiating Towards  down to knees    Pain Onset  More than a month ago    Pain Frequency  Constant    Aggravating Factors   OA, increased activity    Pain Relieving Factors  pain medication, heat some            [facility] Adult PT Treatment/Exercise - 03/02/19 0724      Transfers   Transfers  Sit to Stand;Stand to Sit    Sit to Stand  5: Supervision;With upper extremity assist;From chair/3-in-1;From bed    Stand to Sit  5: Supervision;With upper extremity assist;To chair/3-in-1;To bed      Ambulation/Gait   Ambulation/Gait  Yes    Ambulation/Gait Assistance  5: Supervision    Ambulation/Gait Assistance Details  cues on rollator position, hand position on breaks and posture with gait. pt reports less pain with use of rollator vs cane. Pt noted to be more stable with less scissoring and decreased lateral instability with use of rollator for gait. Will send request to MD for rollator order. Did discuss with pt/daugther that  insurance will only purchase a walking device every 5 years. Both were uncertain how long it's been since she got her RW at home.     Ambulation Distance (Feet)  230 Feet   x2   Assistive device  Rollator  Gait Pattern  Step-through pattern;Antalgic;Lateral trunk lean to left   no lean with rollator   Ambulation Surface  Level;Indoor      Exercises   Exercises  Other Exercises    Other Exercises   issued ex's to HEP with cues on form/technique. Refer to [place] program for full details. No issues reported with performance in session today.       Issued the following to pt's HEP today. Access Code: [email or web address]  URL: [email or web address]  Date: 03/02/2019  Prepared by: Willow Ora   Exercises Sit to Stand with Armchair - 10 reps - 1 sets - 1x daily - 5x weekly Standing Hip Abduction with Counter Support - 10 reps - 1 sets - 1x daily - 5x weekly Heel rises with counter support - 10 reps - 1 sets - 1x daily - 5x weekly       PT Education - 03/02/19 0752    Education Details  use of/benefits of using a rollator; initial HEP    Person(s) Educated  Patient    Methods  Explanation;Demonstration;Verbal cues    Comprehension  Verbalized understanding;Returned demonstration;Tactile cues required;Need further instruction       PT Short Term Goals - 02/24/19 2049      PT SHORT TERM GOAL #1   Title  same as LTG's        PT Long Term Goals - 02/24/19 2049      PT LONG TERM GOAL #1   Title  Improve Berg balance test score from 41/56 to >/= 46/56 to decrease fall risk.    Baseline  41/56 on 02-23-19    Time  4    Period  Weeks    Status  New    Target Date  03/26/19      PT LONG TERM GOAL #2   Title  Increase gait velocity from 1.69 ft/sec with SPC to >/= 2.1 ft/sec with SPC for incr. gait efficiency.    Time  4    Period  Weeks    Status  New    Target Date  03/26/19      PT LONG TERM GOAL #3   Title  Improve TUG score from 18.35 secs to </= 15.0 secs with  SPC for reduced fall risk.    Baseline  18.35 secs with SPC - 02-23-19    Time  4    Period  Weeks    Status  New    Target Date  03/26/19      PT LONG TERM GOAL #4   Title  Pt will amb.300' with RW (or rollator) with supervision for incr. community accessibility.    Baseline  100' with SPC on 02-23-19    Time  4    Period  Weeks    Status  New    Target Date  03/26/19      PT LONG TERM GOAL #5   Title  Independent in HEP for balance and strengthening exs.    Time  4    Period  Weeks    Status  New    Target Date  03/26/19            Plan - 03/02/19 1630    Clinical Impression Statement  Today's skilled session focused on gait training with rollator and initiation of HEP to address LE strengthening. No increase in pain reported with session. The pt is interested in aquatic therapy however does not have transportation for pm appts. Will follow up with [facility]  at [address] to see if our transportation service covers those appointments. Will send request to MD for rollator order as well. The pt is progressing toward goals and should benefit from continued PT to progress toward unmet goals.    Personal Factors and Comorbidities  Behavior Pattern;Fitness;Comorbidity 2;Time since onset of injury/illness/exacerbation;Transportation    Comorbidities  h/o breast cancer, DM, fibromyalgia, HTN, periperal neuropathy, and dyspnea; OA; chronic low back pain    Examination-Activity Limitations  Stairs;Stand;Squat;Locomotion Level;Transfers    Examination-Participation Restrictions  Meal Prep;Cleaning;Community Activity;Interpersonal Relationship;Shop    Stability/Clinical Decision Making  Evolving/Moderate complexity    Rehab Potential  Good    PT Frequency  2x / week    PT Duration  4 weeks   will renew if pt makes progress   PT Treatment/Interventions  ADLs/Self Care Home Management;Aquatic Therapy;DME Instruction;Gait training;Stair training;Therapeutic activities;Therapeutic  exercise;Balance training;Neuromuscular re-education;Patient/family education;Manual techniques;Moist Heat    PT Next Visit Plan  continue with use of rollator- train on barriers/outdoor surfaces; continue to work on strengthening. begin to address balance. Address hip/IT tightness- ? Foam roller.    PT Home Exercise Plan  balance    Consulted and Agree with Plan of Care  Patient;Family member/caregiver    Family Member Consulted  daughter       Patient will benefit from skilled therapeutic intervention in order to improve the following deficits and impairments:  Abnormal gait, Decreased activity tolerance, Decreased balance, Decreased strength, Pain, Impaired sensation  Visit Diagnosis: Muscle weakness (generalized)  Other abnormalities of gait and mobility  Unsteadiness on feet     Problem List Patient Active Problem List   Diagnosis Date Noted  . Gait abnormality 01/09/2019  . Paresthesia 01/09/2019  . Near syncope   . Weakness   . Essential hypertension   . Generalized weakness 12/13/2018  . Syncope 12/13/2018  . Peripheral neuropathy 06/08/2016  . Low back pain without sciatica 06/08/2016  . Abnormality of gait 06/08/2016  . Anemia due to antineoplastic chemotherapy 03/06/2016  . Hypersensitivity reaction 01/07/2016  . Port catheter in place 01/03/2016  . Malignant neoplasm of upper outer quadrant of female breast ([place]) 10/30/2015  . Primary osteoarthritis of right knee 02/28/2015  . Post-traumatic arthritis of ankle 05/10/2014  . MGUS (monoclonal gammopathy of unknown significance) 02/17/2012  . Diverticulosis of colon (without mention of hemorrhage) 01/21/2011  . Family history of malignant neoplasm of gastrointestinal tract 01/21/2011  . Special screening for malignant neoplasms, colon 01/21/2011  . Other symptoms involving digestive system(787.99) 01/21/2011    Willow Ora, PTA, [facility] [address] [place], [place]  [address] [phone] 03/03/19, 9:30 AM   Name: Julie [name] MRN: [id number] Date of Birth: Aug 30, 1932

## 2019-03-07 ENCOUNTER — Ambulatory Visit: Payer: Medicare Other | Admitting: Physical Therapy

## 2019-03-07 ENCOUNTER — Telehealth: Payer: Self-pay | Admitting: Physical Therapy

## 2019-03-07 DIAGNOSIS — R269 Unspecified abnormalities of gait and mobility: Secondary | ICD-10-CM

## 2019-03-07 NOTE — Telephone Encounter (Signed)
Dr. Krista Blue,  Julie [name] is being treated by physical therapy for gait disorder/weakness/falls.  She will benefit from use of a rollator waker in order to improve safety with functional mobility.    If you agree, please submit request in EPIC under MD Order, Other Orders rollator walker or fax to [facility] at [phone].   Thank you,  Willow Ora, PTA, [facility] [address] [place], [place] [address] [phone] 03/07/19, 8:00 AM   [facility] [address] [place], [place]  [address] Phone:  [phone] Fax:  [phone]

## 2019-03-09 ENCOUNTER — Encounter: Payer: Self-pay | Admitting: Physical Therapy

## 2019-03-09 ENCOUNTER — Ambulatory Visit: Payer: Medicare Other | Admitting: Physical Therapy

## 2019-03-09 ENCOUNTER — Other Ambulatory Visit: Payer: Self-pay

## 2019-03-09 DIAGNOSIS — R2689 Other abnormalities of gait and mobility: Secondary | ICD-10-CM | POA: Diagnosis not present

## 2019-03-09 DIAGNOSIS — R2681 Unsteadiness on feet: Secondary | ICD-10-CM | POA: Diagnosis not present

## 2019-03-09 DIAGNOSIS — M6281 Muscle weakness (generalized): Secondary | ICD-10-CM | POA: Diagnosis not present

## 2019-03-09 DIAGNOSIS — R269 Unspecified abnormalities of gait and mobility: Secondary | ICD-10-CM | POA: Diagnosis not present

## 2019-03-10 ENCOUNTER — Ambulatory Visit: Payer: Medicare Other | Admitting: Physical Therapy

## 2019-03-12 NOTE — Therapy (Signed)
[facility] [address] [place], [name], [address] Phone: [phone]   Fax:  [phone]  Physical Therapy Treatment  Patient Details  Name: Julie [name] MRN: [id number] Date of Birth: [birth year] Referring Provider (PT): Dr. Marcial Pacas   Encounter Date: 03/09/2019   03/09/19 0721  PT Visits / Re-Eval  Visit Number 3  Number of Visits 9  Date for PT Re-Evaluation 03/26/19  Authorization  Authorization Type Medicare/Aetna  Authorization Time Period 02-23-19 - 05-24-19  PT Time Calculation  PT Start Time 0716  PT Stop Time 0757  PT Time Calculation (min) 41 min  PT - End of Session  Equipment Utilized During Treatment Gait belt  Activity Tolerance Patient tolerated treatment well;No increased pain;Patient limited by fatigue  Behavior During Therapy [facility] for tasks assessed/performed     Past Medical History:  Diagnosis Date  . Anemia   . Anxiety    takes Klonopin at bedtime  . Arthritis    takes Methotrexate weekly and Prednisone  . Blood transfusion    no abnormal reaction noted  . Cancer (HCC)    breast   . Cataracts, bilateral    immature  . Complication of anesthesia    wakes up during surgery  . Depression   . Diabetes mellitus without complication ([place])   . Dyspnea    with exertion since chemo   . Fibromyalgia    takes Lyrica daily  . GERD (gastroesophageal reflux disease)    takes Nexium daily  . Glaucoma   . Hard of hearing   . Heart murmur   . Histoplasmosis   . History of colon polyps   . History of gout   . History of hiatal hernia   . History of shingles   . Hyperlipidemia   . Hypertension    takes Amlodipine,Metoprolol,and Losartan daily  . Incontinence of bowel   . Interstitial cystitis   . Joint pain   . Low back pain   . Nocturia   . Peripheral neuropathy   . Peripheral neuropathy   . Personal history of chemotherapy   . Personal history of radiation therapy   .  PONV (postoperative nausea and vomiting)   . Ulcer 1970   duodenal  . Urinary frequency   . Urinary urgency   . Weakness    numbness in both hands and feet    Past Surgical History:  Procedure Laterality Date  . ABDOMINAL HYSTERECTOMY    . accupuncture  3 yrs ago  . ANKLE FUSION Left 05/10/2014   Procedure: LEFT ANKLE ARTHRODESIS ;  Surgeon: Wylene Simmer, MD;  Location: [facility];  Service: Orthopedics;  Laterality: Left;  . ANKLE SURGERY Left   . arm surgery Left    radius  . bladder tacked     . BREAST LUMPECTOMY Left   . CERVICAL FUSION    . CHOLECYSTECTOMY    . COLONOSCOPY    . CYSTOCELE REPAIR    . DILATION AND CURETTAGE OF UTERUS    . ESOPHAGOGASTRODUODENOSCOPY    . PORT A CATH REVISION N/A 12/27/2015   Procedure: PORT A CATH REVISION;  Surgeon: Rolm Bookbinder, MD;  Location: [facility];  Service: General;  Laterality: N/A;  . PORT-A-CATH REMOVAL N/A 03/25/2016   Procedure: REMOVAL PORT-A-CATH;  Surgeon: Rolm Bookbinder, MD;  Location[facility];  Service: General;  Laterality: N/A;  . PORTACATH PLACEMENT Right 12/27/2015   Procedure: INSERTION PORT-A-CATH WITH ULTRASOUND ;  Surgeon: Rolm Bookbinder, MD;  Location: [facility];  Service: General;  Laterality: Right;  . RADIOACTIVE SEED GUIDED PARTIAL MASTECTOMY WITH AXILLARY SENTINEL LYMPH NODE BIOPSY Left 11/14/2015   Procedure: RADIOACTIVE SEED GUIDED LEFT BREAST LUMPECTOMY WITH AXILLARY SENTINEL LYMPH NODE BIOPSY;  Surgeon: Rolm Bookbinder, MD;  Location: [facility];  Service: General;  Laterality: Left;  RADIOACTIVE SEED GUIDED LEFT BREAST LUMPECTOMY WITH AXILLARY SENTINEL LYMPH NODE BIOPSY  . SALPINGOOPHORECTOMY Bilateral   . TOTAL KNEE ARTHROPLASTY Right 02/28/2015   Procedure: RIGHT TOTAL KNEE ARTHROPLASTY;  Surgeon: Rod Can, MD;  [facility];  Service: Orthopedics;  Laterality: Right;  . UPPER GASTROINTESTINAL ENDOSCOPY    . VAGINAL HYSTERECTOMY      There were  no vitals filed for this visit.   03/09/19 0719  Symptoms/Limitations  Subjective Pt reports increased right buttock/hip pain for about a week now. Very antalgic gait with cane walking into clinic today. No falls.  Pertinent History OA, h/o breast cancer, DM, fibromyalgia, HTN, peripheral neuropathy, dyspnea  Limitations Standing;Walking;House hold activities  Diagnostic tests MRI was done approx. 1 month ago  Pain Assessment  Currently in Pain? Yes  Pain Score  ("15/10")  Pain Location Hip  Pain Orientation Right  Pain Descriptors / Indicators Aching;Sharp;Sore  Pain Type Chronic pain  Pain Radiating Towards down right leg  Pain Onset More than a month ago  Pain Frequency Constant  Aggravating Factors  unknown  Pain Relieving Factors tramadol, heat,         03/09/19 0727  Transfers  Transfers Sit to Stand;Stand to Sit  Sit to Stand 5: Supervision;With upper extremity assist;From chair/3-in-1;From bed  Stand to Sit 5: Supervision;With upper extremity assist;To chair/3-in-1;To bed  Ambulation/Gait  Ambulation/Gait Yes  Ambulation/Gait Assistance 5: Supervision;4: Min guard;4: Min assist  Ambulation/Gait Assistance Details cues on posture, hand placement on brakes of rollator and rollator position.   Ambulation Distance (Feet) 115 Feet (x2 w/rollator; in/out of gym with cane)  Assistive device Rollator;Straight cane  Gait Pattern Step-through pattern;Antalgic;Lateral trunk lean to left  Ambulation Surface Level;Indoor  Modalities  Modalities Moist Heat  Moist Heat Therapy  Number Minutes Moist Heat  (concurrent with manual therapy for 5-6 minutes only)  Moist Heat Location Hip (concurrent with manual therapy for ~6 minutes)  Manual Therapy  Manual Therapy Soft tissue mobilization;Other (comment)  Manual therapy comments all manual therapy performed for decreased muscle tightness and decreased pain. Pt with tenderness over piriformis, decreased with piriformis release. IT  band tightness addressed manually, then with foam roller with decreased pain and tighness reported afterwards.              Soft tissue mobilization IT band and glut on right side   In left side lying: pt performed right LE clamshells with no resistance for 2 sets of 10 reps, then right hip abduction for 10 reps with assistance needed.      PT Short Term Goals - 02/24/19 2049      PT SHORT TERM GOAL #1   Title  same as LTG's        PT Long Term Goals - 02/24/19 2049      PT LONG TERM GOAL #1   Title  Improve Berg balance test score from 41/56 to >/= 46/56 to decrease fall risk.    Baseline  41/56 on 02-23-19    Time  4    Period  Weeks    Status  New    Target Date  03/26/19  PT LONG TERM GOAL #2   Title  Increase gait velocity from 1.69 ft/sec with SPC to >/= 2.1 ft/sec with SPC for incr. gait efficiency.    Time  4    Period  Weeks    Status  New    Target Date  03/26/19      PT LONG TERM GOAL #3   Title  Improve TUG score from 18.35 secs to </= 15.0 secs with SPC for reduced fall risk.    Baseline  18.35 secs with SPC - 02-23-19    Time  4    Period  Weeks    Status  New    Target Date  03/26/19      PT LONG TERM GOAL #4   Title  Pt will amb.300' with RW (or rollator) with supervision for incr. community accessibility.    Baseline  100' with SPC on 02-23-19    Time  4    Period  Weeks    Status  New    Target Date  03/26/19      PT LONG TERM GOAL #5   Title  Independent in HEP for balance and strengthening exs.    Time  4    Period  Weeks    Status  New    Target Date  03/26/19         03/09/19 [id number]  Plan  Clinical Impression Statement Today's skilled session worked on decreased right hip pain/muscle tightness with pt reporting pain at a 7-8/10. Continued to work on gait with rollator as well. Have not received the order from MD as yet, will give to pt/daughter when it comes in. Did discuss with pt/daughter use of her standard RW for now to offload  hips/back with gait vs the cane. She agreed to think about it. The pt is making slow, steady progress toward goals and should benfeit from continued PT to progress toward unmet goals.  Personal Factors and Comorbidities Behavior Pattern;Fitness;Comorbidity 2;Time since onset of injury/illness/exacerbation;Transportation  Comorbidities h/o breast cancer, DM, fibromyalgia, HTN, periperal neuropathy, and dyspnea; OA; chronic low back pain at a 7-8/10 after session.  Examination-Activity Limitations Stairs;Stand;Squat;Locomotion Level;Transfers  Examination-Participation Restrictions Meal Prep;Cleaning;Community Activity;Interpersonal Relationship;Shop  Pt will benefit from skilled therapeutic intervention in order to improve on the following deficits Abnormal gait;Decreased activity tolerance;Decreased balance;Decreased strength;Pain;Impaired sensation  Stability/Clinical Decision Making Evolving/Moderate complexity  Rehab Potential Good  PT Frequency 2x / week  PT Duration 4 weeks (will renew if pt makes progress)  PT Treatment/Interventions ADLs/Self Care Home Management;Aquatic Therapy;DME Instruction;Gait training;Stair training;Therapeutic activities;Therapeutic exercise;Balance training;Neuromuscular re-education;Patient/family education;Manual techniques;Moist Heat  PT Next Visit Plan continue with use of rollator- train on barriers/outdoor surfaces; continue to work on strengthening. begin to address balance.  PT Home Exercise Plan balance  Consulted and Agree with Plan of Care Patient;Family member/caregiver  Family Member Consulted daughter          Patient will benefit from skilled therapeutic intervention in order to improve the following deficits and impairments:  Abnormal gait, Decreased activity tolerance, Decreased balance, Decreased strength, Pain, Impaired sensation  Visit Diagnosis: Muscle weakness (generalized)  Other abnormalities of gait and mobility     Problem  List Patient Active Problem List   Diagnosis Date Noted  . Gait abnormality 01/09/2019  . Paresthesia 01/09/2019  . Near syncope   . Weakness   . Essential hypertension   . Generalized weakness 12/13/2018  . Syncope 12/13/2018  . Peripheral neuropathy 06/08/2016  . Low back pain without  sciatica 06/08/2016  . Abnormality of gait 06/08/2016  . Anemia due to antineoplastic chemotherapy 03/06/2016  . Hypersensitivity reaction 01/07/2016  . Port catheter in place 01/03/2016  . Malignant neoplasm of upper outer quadrant of female breast ([place]) 10/30/2015  . Primary osteoarthritis of right knee 02/28/2015  . Post-traumatic arthritis of ankle 05/10/2014  . MGUS (monoclonal gammopathy of unknown significance) 02/17/2012  . Diverticulosis of colon (without mention of hemorrhage) 01/21/2011  . Family history of malignant neoplasm of gastrointestinal tract 01/21/2011  . Special screening for malignant neoplasms, colon 01/21/2011  . Other symptoms involving digestive system(787.99) 01/21/2011    Willow Ora, PTA, [facility] [address], [place], [place] [address] [phone] 03/12/19, 1:08 PM   Name: Julie [name] MRN: [id number] Date of Birth: Jul 05, 1932

## 2019-03-14 ENCOUNTER — Ambulatory Visit: Payer: Medicare Other | Admitting: Physical Therapy

## 2019-03-15 NOTE — Telephone Encounter (Signed)
Order for rollator walker was put in

## 2019-03-15 NOTE — Telephone Encounter (Signed)
Faxed and confirmed to the requested location below.  Orders also in [place].

## 2019-03-16 ENCOUNTER — Other Ambulatory Visit: Payer: Self-pay

## 2019-03-16 ENCOUNTER — Encounter: Payer: Self-pay | Admitting: Physical Therapy

## 2019-03-16 ENCOUNTER — Ambulatory Visit: Payer: Medicare Other | Admitting: Physical Therapy

## 2019-03-16 DIAGNOSIS — E119 Type 2 diabetes mellitus without complications: Secondary | ICD-10-CM | POA: Diagnosis not present

## 2019-03-16 DIAGNOSIS — M0579 Rheumatoid arthritis with rheumatoid factor of multiple sites without organ or systems involvement: Secondary | ICD-10-CM | POA: Diagnosis not present

## 2019-03-16 DIAGNOSIS — R269 Unspecified abnormalities of gait and mobility: Secondary | ICD-10-CM

## 2019-03-16 DIAGNOSIS — E538 Deficiency of other specified B group vitamins: Secondary | ICD-10-CM | POA: Diagnosis not present

## 2019-03-16 DIAGNOSIS — E7800 Pure hypercholesterolemia, unspecified: Secondary | ICD-10-CM | POA: Diagnosis not present

## 2019-03-16 DIAGNOSIS — E559 Vitamin D deficiency, unspecified: Secondary | ICD-10-CM | POA: Diagnosis not present

## 2019-03-16 DIAGNOSIS — M6281 Muscle weakness (generalized): Secondary | ICD-10-CM

## 2019-03-16 DIAGNOSIS — R2689 Other abnormalities of gait and mobility: Secondary | ICD-10-CM | POA: Diagnosis not present

## 2019-03-16 DIAGNOSIS — I10 Essential (primary) hypertension: Secondary | ICD-10-CM | POA: Diagnosis not present

## 2019-03-16 DIAGNOSIS — M4316 Spondylolisthesis, lumbar region: Secondary | ICD-10-CM | POA: Diagnosis not present

## 2019-03-16 DIAGNOSIS — K219 Gastro-esophageal reflux disease without esophagitis: Secondary | ICD-10-CM | POA: Diagnosis not present

## 2019-03-16 DIAGNOSIS — H109 Unspecified conjunctivitis: Secondary | ICD-10-CM | POA: Diagnosis not present

## 2019-03-16 DIAGNOSIS — M5136 Other intervertebral disc degeneration, lumbar region: Secondary | ICD-10-CM | POA: Diagnosis not present

## 2019-03-16 DIAGNOSIS — R2681 Unsteadiness on feet: Secondary | ICD-10-CM | POA: Diagnosis not present

## 2019-03-16 DIAGNOSIS — M4800 Spinal stenosis, site unspecified: Secondary | ICD-10-CM | POA: Diagnosis not present

## 2019-03-16 DIAGNOSIS — D472 Monoclonal gammopathy: Secondary | ICD-10-CM | POA: Diagnosis not present

## 2019-03-17 NOTE — Therapy (Signed)
[facility] [address] [place], [name], [address] Phone: [phone]   Fax:  [phone]  Physical Therapy Treatment  Patient Details  Name: Julie [name] MRN: [id number] Date of Birth: October 24, 1932 Referring Provider (PT): Dr. Marcial Pacas   Encounter Date: 03/16/2019  PT End of Session - 03/16/19 0721    Visit Number  4    Number of Visits  9    Date for PT Re-Evaluation  03/26/19    Authorization Type  Medicare/Aetna    Authorization Time Period  02-23-19 - 05-24-19    PT Start Time  0715    PT Stop Time  0754    PT Time Calculation (min)  39 min    Equipment Utilized During Treatment  Gait belt    Activity Tolerance  Patient tolerated treatment well;No increased pain;Patient limited by fatigue    Behavior During Therapy  [facility] for tasks assessed/performed       Past Medical History:  Diagnosis Date  . Anemia   . Anxiety    takes Klonopin at bedtime  . Arthritis    takes Methotrexate weekly and Prednisone  . Blood transfusion    no abnormal reaction noted  . Cancer (HCC)    breast   . Cataracts, bilateral    immature  . Complication of anesthesia    wakes up during surgery  . Depression   . Diabetes mellitus without complication ([place])   . Dyspnea    with exertion since chemo   . Fibromyalgia    takes Lyrica daily  . GERD (gastroesophageal reflux disease)    takes Nexium daily  . Glaucoma   . Hard of hearing   . Heart murmur   . Histoplasmosis   . History of colon polyps   . History of gout   . History of hiatal hernia   . History of shingles   . Hyperlipidemia   . Hypertension    takes Amlodipine,Metoprolol,and Losartan daily  . Incontinence of bowel   . Interstitial cystitis   . Joint pain   . Low back pain   . Nocturia   . Peripheral neuropathy   . Peripheral neuropathy   . Personal history of chemotherapy   . Personal history of radiation therapy   . PONV (postoperative nausea and  vomiting)   . Ulcer 1970   duodenal  . Urinary frequency   . Urinary urgency   . Weakness    numbness in both hands and feet    Past Surgical History:  Procedure Laterality Date  . ABDOMINAL HYSTERECTOMY    . accupuncture  3 yrs ago  . ANKLE FUSION Left 05/10/2014   Procedure: LEFT ANKLE ARTHRODESIS ;  Surgeon: Wylene Simmer, MD;  Location: [facility];  Service: Orthopedics;  Laterality: Left;  . ANKLE SURGERY Left   . arm surgery Left    radius  . bladder tacked     . BREAST LUMPECTOMY Left   . CERVICAL FUSION    . CHOLECYSTECTOMY    . COLONOSCOPY    . CYSTOCELE REPAIR    . DILATION AND CURETTAGE OF UTERUS    . ESOPHAGOGASTRODUODENOSCOPY    . PORT A CATH REVISION N/A 12/27/2015   Procedure: PORT A CATH REVISION;  Surgeon: Rolm Bookbinder, MD;  Location: [facility];  Service: General;  Laterality: N/A;  . PORT-A-CATH REMOVAL N/A 03/25/2016   Procedure: REMOVAL PORT-A-CATH;  Surgeon: Rolm Bookbinder, MD;  [facility];  Service: General;  Laterality: N/A;  . PORTACATH PLACEMENT Right 12/27/2015   Procedure: INSERTION PORT-A-CATH WITH ULTRASOUND ;  Surgeon: Rolm Bookbinder, MD;  Location: [facility];  Service: General;  Laterality: Right;  . RADIOACTIVE SEED GUIDED PARTIAL MASTECTOMY WITH AXILLARY SENTINEL LYMPH NODE BIOPSY Left 11/14/2015   Procedure: RADIOACTIVE SEED GUIDED LEFT BREAST LUMPECTOMY WITH AXILLARY SENTINEL LYMPH NODE BIOPSY;  Surgeon: Rolm Bookbinder, MD;  Location: [facility];  Service: General;  Laterality: Left;  RADIOACTIVE SEED GUIDED LEFT BREAST LUMPECTOMY WITH AXILLARY SENTINEL LYMPH NODE BIOPSY  . SALPINGOOPHORECTOMY Bilateral   . TOTAL KNEE ARTHROPLASTY Right 02/28/2015   Procedure: RIGHT TOTAL KNEE ARTHROPLASTY;  Surgeon: Rod Can, MD;  [facility];  Service: Orthopedics;  Laterality: Right;  . UPPER GASTROINTESTINAL ENDOSCOPY    . VAGINAL HYSTERECTOMY      Vitals:    Subjective Assessment -  03/16/19 0718    Subjective  Has been using the standard wide RW since last session. However did have a fall when she sat on the front of it to rest. [place] backwards. Daughters witnessed it and assisted her up. Pt reports no injuries with fall.    Pertinent History  OA, h/o breast cancer, DM, fibromyalgia, HTN, peripheral neuropathy, dyspnea    Limitations  Standing;Walking;House hold activities    Diagnostic tests  MRI was done approx. 1 month ago    Patient Stated Goals  "I just want pain relief"    Currently in Pain?  Yes    Pain Score  7     Pain Location  Hip    Pain Orientation  Right    Pain Descriptors / Indicators  Aching;Sore;Sharp    Pain Type  Chronic pain    Pain Radiating Towards  down right leg    Pain Onset  More than a month ago    Pain Frequency  Constant    Aggravating Factors   unknown    Pain Relieving Factors  tramadol, heat            [facility] Adult PT Treatment/Exercise - 03/16/19 0728      Transfers   Transfers  Sit to Stand;Stand to Sit    Sit to Stand  5: Supervision;With upper extremity assist;From chair/3-in-1;From bed    Stand to Sit  5: Supervision;With upper extremity assist;To chair/3-in-1;To bed      Ambulation/Gait   Ambulation/Gait  Yes    Ambulation/Gait Assistance  4: Min guard;5: Supervision    Ambulation/Gait Assistance Details  pt arrived with wide RW, now using this as advised for decreased pain and improved safety with gait. Changed to rollator in session with continued cues on posture, step length and rollator position needed.     Ambulation Distance (Feet)  115 Feet   x2 with rollator   Assistive device  Rolling walker;Rollator    Gait Pattern  Step-through pattern;Antalgic;Lateral trunk lean to left    Ambulation Surface  Level;Indoor      Self-Care   Self-Care  Other Self-Care Comments    Other Self-Care Comments   discussed not sitting on her walker as a fall prevention strategy. Pt's daughter also reported that the pt needed new  shoes as she tends to wear slip on shoes with no support (pt hides them so the kids will not trash them). Pt's other daughter wants to get her new ones. Gave them a care for [employer] to allow them to get more information on the best shoe options for the patient. Also provided  pt and daughter with copy of script for rollator walker.       Exercises   Exercises  Other Exercises    Other Exercises   in hooklying after manual therapy- abdominal bracing with posterior pelvic tilt, then abdominal bracing for alternating marching for 10 reps each. cues needed with each rep for the abdominal bracing.       Manual Therapy   Manual Therapy  Joint mobilization;Soft tissue mobilization;Manual Traction    Manual therapy comments  all manual therapy performed for decreased muscle tightness and decreased pain. Pt with tenderness over piriformis, decreased with piriformis release. IT band tightness addressed manually, then with foam roller with decreased pain and tighness reported afterwards.                Joint Mobilization  in sidelying: right pelvic motions passively to work on mobility and muscle stretching.     Soft tissue mobilization  IT band and glut on right side    Manual Traction  in left sidelying- right hip gentle manual distraction, then has pt lie in hooklying for right hip gentle manual distraction.                PT Short Term Goals - 02/24/19 2049      PT SHORT TERM GOAL #1   Title  same as LTG's        PT Long Term Goals - 02/24/19 2049      PT LONG TERM GOAL #1   Title  Improve Berg balance test score from 41/56 to >/= 46/56 to decrease fall risk.    Baseline  41/56 on 02-23-19    Time  4    Period  Weeks    Status  New    Target Date  03/26/19      PT LONG TERM GOAL #2   Title  Increase gait velocity from 1.69 ft/sec with SPC to >/= 2.1 ft/sec with SPC for incr. gait efficiency.    Time  4    Period  Weeks    Status  New    Target Date  03/26/19      PT LONG  TERM GOAL #3   Title  Improve TUG score from 18.35 secs to </= 15.0 secs with SPC for reduced fall risk.    Baseline  18.35 secs with SPC - 02-23-19    Time  4    Period  Weeks    Status  New    Target Date  03/26/19      PT LONG TERM GOAL #4   Title  Pt will amb.300' with RW (or rollator) with supervision for incr. community accessibility.    Baseline  100' with SPC on 02-23-19    Time  4    Period  Weeks    Status  New    Target Date  03/26/19      PT LONG TERM GOAL #5   Title  Independent in HEP for balance and strengthening exs.    Time  4    Period  Weeks    Status  New    Target Date  03/26/19            Plan - 03/16/19 [id number]    Clinical Impression Statement  Today's skilled session continued to focus on decreased pain, strengthening and gait training with rollator. The pt is making slow, steady progress toward goals.    Personal Factors and Comorbidities  Behavior Pattern;Fitness;Comorbidity 2;Time since onset of  injury/illness/exacerbation;Transportation    Comorbidities  h/o breast cancer, DM, fibromyalgia, HTN, periperal neuropathy, and dyspnea; OA; chronic low back pain at a 7-8/10 after session.    Examination-Activity Limitations  Stairs;Stand;Squat;Locomotion Level;Transfers    Examination-Participation Restrictions  Meal Prep;Cleaning;Community Activity;Interpersonal Relationship;Shop    Stability/Clinical Decision Making  Evolving/Moderate complexity    Rehab Potential  Good    PT Frequency  2x / week    PT Duration  4 weeks   will renew if pt makes progress   PT Treatment/Interventions  ADLs/Self Care Home Management;Aquatic Therapy;DME Instruction;Gait training;Stair training;Therapeutic activities;Therapeutic exercise;Balance training;Neuromuscular re-education;Patient/family education;Manual techniques;Moist Heat    PT Next Visit Plan  continue with use of rollator- train on barriers/outdoor surfaces; continue to work on strengthening. begin to address  balance.    PT Home Exercise Plan  balance    Consulted and Agree with Plan of Care  Patient;Family member/caregiver    Family Member Consulted  daughter       Patient will benefit from skilled therapeutic intervention in order to improve the following deficits and impairments:  Abnormal gait, Decreased activity tolerance, Decreased balance, Decreased strength, Pain, Impaired sensation  Visit Diagnosis: Muscle weakness (generalized)  Other abnormalities of gait and mobility  Gait abnormality  Unsteadiness on feet     Problem List Patient Active Problem List   Diagnosis Date Noted  . Gait abnormality 01/09/2019  . Paresthesia 01/09/2019  . Near syncope   . Weakness   . Essential hypertension   . Generalized weakness 12/13/2018  . Syncope 12/13/2018  . Peripheral neuropathy 06/08/2016  . Low back pain without sciatica 06/08/2016  . Abnormality of gait 06/08/2016  . Anemia due to antineoplastic chemotherapy 03/06/2016  . Hypersensitivity reaction 01/07/2016  . Port catheter in place 01/03/2016  . Malignant neoplasm of upper outer quadrant of female breast ([place]) 10/30/2015  . Primary osteoarthritis of right knee 02/28/2015  . Post-traumatic arthritis of ankle 05/10/2014  . MGUS (monoclonal gammopathy of unknown significance) 02/17/2012  . Diverticulosis of colon (without mention of hemorrhage) 01/21/2011  . Family history of malignant neoplasm of gastrointestinal tract 01/21/2011  . Special screening for malignant neoplasms, colon 01/21/2011  . Other symptoms involving digestive system(787.99) 01/21/2011    Willow Ora, PTA, [facility] [address] [place],  [address] [phone] 03/17/19, 10:14 AM   Name: GITANJALI MORMINO MRN: [id number] Date of Birth: [birth year]

## 2019-03-22 DIAGNOSIS — H2000 Unspecified acute and subacute iridocyclitis: Secondary | ICD-10-CM | POA: Diagnosis not present

## 2019-03-23 ENCOUNTER — Encounter: Payer: Self-pay | Admitting: Physical Therapy

## 2019-03-23 ENCOUNTER — Other Ambulatory Visit: Payer: Self-pay

## 2019-03-23 ENCOUNTER — Ambulatory Visit: Payer: Medicare Other | Admitting: Physical Therapy

## 2019-03-23 DIAGNOSIS — R2689 Other abnormalities of gait and mobility: Secondary | ICD-10-CM

## 2019-03-23 DIAGNOSIS — M6281 Muscle weakness (generalized): Secondary | ICD-10-CM

## 2019-03-23 DIAGNOSIS — R2681 Unsteadiness on feet: Secondary | ICD-10-CM

## 2019-03-23 DIAGNOSIS — R269 Unspecified abnormalities of gait and mobility: Secondary | ICD-10-CM | POA: Diagnosis not present

## 2019-03-24 ENCOUNTER — Ambulatory Visit: Payer: Medicare Other | Admitting: Physical Therapy

## 2019-03-24 NOTE — Therapy (Signed)
[facility] [address] [place], [name], [address] Phone: [phone]   Fax:  [phone]  Physical Therapy Treatment  Patient Details  Name: Julie [name] MRN: [id number] Date of Birth: 12-07-1932 Referring Provider (PT): Dr. Marcial Pacas   Encounter Date: 03/23/2019  PT End of Session - 03/24/19 1714    Visit Number  5    Number of Visits  13   renewal to be completed for 4 additional land visits and 4 aquatic visits   Date for PT Re-Evaluation  04/24/19    Authorization Type  Medicare/Aetna    Authorization Time Period  02-23-19 - 05-24-19    PT Start Time  0714    PT Stop Time  0800    PT Time Calculation (min)  46 min    Equipment Utilized During Treatment  Gait belt    Activity Tolerance  Patient tolerated treatment well;No increased pain;Patient limited by fatigue    Behavior During Therapy  [facility] for tasks assessed/performed       Past Medical History:  Diagnosis Date  . Anemia   . Anxiety    takes Klonopin at bedtime  . Arthritis    takes Methotrexate weekly and Prednisone  . Blood transfusion    no abnormal reaction noted  . Cancer (HCC)    breast   . Cataracts, bilateral    immature  . Complication of anesthesia    wakes up during surgery  . Depression   . Diabetes mellitus without complication ([place])   . Dyspnea    with exertion since chemo   . Fibromyalgia    takes Lyrica daily  . GERD (gastroesophageal reflux disease)    takes Nexium daily  . Glaucoma   . Hard of hearing   . Heart murmur   . Histoplasmosis   . History of colon polyps   . History of gout   . History of hiatal hernia   . History of shingles   . Hyperlipidemia   . Hypertension    takes Amlodipine,Metoprolol,and Losartan daily  . Incontinence of bowel   . Interstitial cystitis   . Joint pain   . Low back pain   . Nocturia   . Peripheral neuropathy   . Peripheral neuropathy   . Personal history of chemotherapy    . Personal history of radiation therapy   . PONV (postoperative nausea and vomiting)   . Ulcer 1970   duodenal  . Urinary frequency   . Urinary urgency   . Weakness    numbness in both hands and feet    Past Surgical History:  Procedure Laterality Date  . ABDOMINAL HYSTERECTOMY    . accupuncture  3 yrs ago  . ANKLE FUSION Left 05/10/2014   Procedure: LEFT ANKLE ARTHRODESIS ;  Surgeon: Wylene Simmer, MD;  Location: [facility];  Service: Orthopedics;  Laterality: Left;  . ANKLE SURGERY Left   . arm surgery Left    radius  . bladder tacked     . BREAST LUMPECTOMY Left   . CERVICAL FUSION    . CHOLECYSTECTOMY    . COLONOSCOPY    . CYSTOCELE REPAIR    . DILATION AND CURETTAGE OF UTERUS    . ESOPHAGOGASTRODUODENOSCOPY    . PORT A CATH REVISION N/A 12/27/2015   Procedure: PORT A CATH REVISION;  Surgeon: Rolm Bookbinder, MD;  Location: [facility];  Service: General;  Laterality: N/A;  . PORT-A-CATH REMOVAL N/A 03/25/2016   Procedure:  REMOVAL PORT-A-CATH;  Surgeon: Rolm Bookbinder, MD;  Location: [facility];  Service: General;  Laterality: N/A;  . PORTACATH PLACEMENT Right 12/27/2015   Procedure: INSERTION PORT-A-CATH WITH ULTRASOUND ;  Surgeon: Rolm Bookbinder, MD;  Location: [facility];  Service: General;  Laterality: Right;  . RADIOACTIVE SEED GUIDED PARTIAL MASTECTOMY WITH AXILLARY SENTINEL LYMPH NODE BIOPSY Left 11/14/2015   Procedure: RADIOACTIVE SEED GUIDED LEFT BREAST LUMPECTOMY WITH AXILLARY SENTINEL LYMPH NODE BIOPSY;  Surgeon: Rolm Bookbinder, MD;  Location: [facility];  Service: General;  Laterality: Left;  RADIOACTIVE SEED GUIDED LEFT BREAST LUMPECTOMY WITH AXILLARY SENTINEL LYMPH NODE BIOPSY  . SALPINGOOPHORECTOMY Bilateral   . TOTAL KNEE ARTHROPLASTY Right 02/28/2015   Procedure: RIGHT TOTAL KNEE ARTHROPLASTY;  Surgeon: Rod Can, MD;  Location: [facility];  Service: Orthopedics;  Laterality: Right;  . UPPER GASTROINTESTINAL ENDOSCOPY     . VAGINAL HYSTERECTOMY      There were no vitals filed for this visit.      [facility] PT Assessment - 03/24/19 0001      Berg Balance Test   Sit to Stand  Able to stand without using hands and stabilize independently    Standing Unsupported  Able to stand safely 2 minutes    Sitting with Back Unsupported but Feet Supported on Floor or Stool  Able to sit safely and securely 2 minutes    Stand to Sit  Sits safely with minimal use of hands    Transfers  Able to transfer safely, minor use of hands    Standing Unsupported with Eyes Closed  Able to stand 10 seconds with supervision    Standing Unsupported with Feet Together  Able to place feet together independently and stand for 1 minute with supervision    From Standing, Reach Forward with Outstretched Arm  Can reach confidently >25 cm (10")    From Standing Position, Pick up Object from [place] to pick up shoe safely and easily    From Standing Position, Turn to Look Behind Over each Shoulder  Looks behind from both sides and weight shifts well    Turn 360 Degrees  Able to turn 360 degrees safely but slowly    Standing Unsupported, Alternately Place Feet on Step/Stool  Able to complete >2 steps/needs minimal assist    Standing Unsupported, One Foot in Front  Able to plae foot ahead of the other independently and hold 30 seconds    Standing on One Leg  Tries to lift leg/unable to hold 3 seconds but remains standing independently    Total Score  45                   [facility] Adult PT Treatment/Exercise - 03/24/19 0001      Transfers   Transfers  Sit to Stand;Stand to Sit    Sit to Stand  5: Supervision;With upper extremity assist;From chair/3-in-1;From bed    Stand to Sit  5: Supervision;With upper extremity assist;To chair/3-in-1;To bed      Ambulation/Gait   Ambulation/Gait  Yes    Ambulation/Gait Assistance  5: Supervision    Ambulation Distance (Feet)  300 Feet    Assistive device  Rolling walker    Gait Pattern   Step-through pattern;Antalgic;Lateral trunk lean to left    Ambulation Surface  Level;Indoor    Gait velocity  2.09 = 2.1 ft/sec with SPC (tested with SPC for consistency as this is device pt was using at time of initial eval)  Standardized Balance Assessment   Standardized Balance Assessment  Berg Balance Test;Timed Up and Go Test      Timed Up and Go Test   TUG  Normal TUG    Normal TUG (seconds)  22.97   with [facility]            PT Education - 03/24/19 1727    Education Details  reviewed LTG's and progress; pt interested in aquatic therapy - informed pt we would inquire if [facility] transportation would take her to [facility] for this service    Person(s) Educated  Patient;Child(ren)    Methods  Explanation    Comprehension  Verbalized understanding       PT Short Term Goals - 03/24/19 1716      PT SHORT TERM GOAL #1   Title  same as LTG's        PT Long Term Goals - 03/23/19 0723      PT LONG TERM GOAL #1   Title  Improve Berg balance test score from 41/56 to >/= 46/56 to decrease fall risk. Upgraded LTG - incr. score from 45/56 to >/= 48/56 for reduced fall risk    Baseline  41/56 on 02-23-19; 45/56 on 03-23-19    Time  4    Period  Weeks    Status  Revised    Target Date  04/24/19      PT LONG TERM GOAL #2   Title  Increase gait velocity from 1.69 ft/sec with SPC to >/= 2.1 ft/sec with SPC for incr. gait efficiency.    Baseline  15.66 secs = 2.09 ft/sec with SPC on 03-23-19    Time  4    Period  Weeks    Status  Achieved      PT LONG TERM GOAL #3   Title  Improve TUG score from 18.35 secs to </= 15.0 secs with SPC for reduced fall risk.  REVISED LTG= improve TUG score from 22.97 secs with cane to </= 17 secs with cane    Baseline  18.35 secs with SPC - 02-23-19;  22.97 secs with SPC -- 03-23-19    Time  4    Period  Weeks    Status  Revised    Target Date  04/24/19      PT LONG TERM GOAL #4   Title  Pt will amb.300' with RW (or rollator) with supervision for incr.  community accessibility.    Baseline  100' with [place] on 02-23-19;  350' with RW (03-23-19)    Time  4    Period  Weeks    Status  Achieved      PT LONG TERM GOAL #5   Title  Independent in HEP for balance and strengthening exs.    Baseline  pt reports she is walking in the home for exercise, not doing the specific exercises "because I'm forgetful"    Time  4    Period  Weeks    Status  Partially Met      Additional Long Term Goals   Additional Long Term Goals  Yes      PT LONG TERM GOAL #6   Title  Independent in aquatic HEP for participation upon D/C from PT.    Time  4    Period  Weeks    Status  New    Target Date  04/24/19      PT LONG TERM GOAL #7   Title  Increase gait velocity from 2.1 ft/sec with  SPC to >/= 2.5 ft/sec with SPC for incr. gait efficiency.    Baseline  2.1 ft/sec with SPC on 03-23-19    Time  4    Period  Weeks    Status  New    Target Date  04/24/19            Plan - 03/24/19 1729    Clinical Impression Statement  Pt has partially met LTG #1 with Berg score increasing from 41/56 to 45/56;  LTG #2 met:  LTG #3 revised due to TUG score decreased today compared to score at initial eval:  LTG #4 met and LTG #5 partially met.  Pt wishes to participate in aquatic therapy if [facility] will provide transportation to the [facility].    Personal Factors and Comorbidities  Behavior Pattern;Fitness;Comorbidity 2;Time since onset of injury/illness/exacerbation;Transportation    Comorbidities  h/o breast cancer, DM, fibromyalgia, HTN, periperal neuropathy, and dyspnea; OA; chronic low back pain at a 7-8/10 after session.    Examination-Activity Limitations  Stairs;Stand;Squat;Locomotion Level;Transfers    Examination-Participation Restrictions  Meal Prep;Cleaning;Community Activity;Interpersonal Relationship;Shop    Stability/Clinical Decision Making  Evolving/Moderate complexity    Rehab Potential  Good    PT Frequency  2x / week   1 land visit/ 1 aquatic visit per week    PT Duration  4 weeks   will renew if pt makes progress   PT Treatment/Interventions  ADLs/Self Care Home Management;Aquatic Therapy;DME Instruction;Gait training;Stair training;Therapeutic activities;Therapeutic exercise;Balance training;Neuromuscular re-education;Patient/family education;Manual techniques;Moist Heat    PT Next Visit Plan  continue with use of rollator - pt to take script to [place];  continue to work on strengthening. begin to address balance.    PT Home Exercise Plan  balance    Consulted and Agree with Plan of Care  Patient;Family member/caregiver    Family Member Consulted  daughter       Patient will benefit from skilled therapeutic intervention in order to improve the following deficits and impairments:  Abnormal gait, Decreased activity tolerance, Decreased balance, Decreased strength, Pain, Impaired sensation  Visit Diagnosis: Muscle weakness (generalized) - Plan: PT plan of care cert/re-cert  Other abnormalities of gait and mobility - Plan: PT plan of care cert/re-cert  Unsteadiness on feet - Plan: PT plan of care cert/re-cert     Problem List Patient Active Problem List   Diagnosis Date Noted  . Gait abnormality 01/09/2019  . Paresthesia 01/09/2019  . Near syncope   . Weakness   . Essential hypertension   . Generalized weakness 12/13/2018  . Syncope 12/13/2018  . Peripheral neuropathy 06/08/2016  . Low back pain without sciatica 06/08/2016  . Abnormality of gait 06/08/2016  . Anemia due to antineoplastic chemotherapy 03/06/2016  . Hypersensitivity reaction 01/07/2016  . Port catheter in place 01/03/2016  . Malignant neoplasm of upper outer quadrant of female breast ([place]) 10/30/2015  . Primary osteoarthritis of right knee 02/28/2015  . Post-traumatic arthritis of ankle 05/10/2014  . MGUS (monoclonal gammopathy of unknown significance) 02/17/2012  . Diverticulosis of colon (without mention of hemorrhage) 01/21/2011  . Family history of  malignant neoplasm of gastrointestinal tract 01/21/2011  . Special screening for malignant neoplasms, colon 01/21/2011  . Other symptoms involving digestive system(787.99) 01/21/2011    Deronte Solis, [place], PT 03/24/2019, 5:37 PM  [facility] [address] [place], [name], [address] Phone: [phone]   Fax:  [phone]  Name: Julie [name] MRN: [id number] Date of Birth: 1932/07/11

## 2019-03-29 DIAGNOSIS — H2000 Unspecified acute and subacute iridocyclitis: Secondary | ICD-10-CM | POA: Diagnosis not present

## 2019-03-30 ENCOUNTER — Ambulatory Visit: Payer: Medicare Other | Attending: Neurology | Admitting: Physical Therapy

## 2019-03-30 ENCOUNTER — Encounter: Payer: Self-pay | Admitting: Physical Therapy

## 2019-03-30 ENCOUNTER — Other Ambulatory Visit: Payer: Self-pay

## 2019-03-30 DIAGNOSIS — R269 Unspecified abnormalities of gait and mobility: Secondary | ICD-10-CM | POA: Diagnosis not present

## 2019-03-30 DIAGNOSIS — M6281 Muscle weakness (generalized): Secondary | ICD-10-CM

## 2019-03-30 DIAGNOSIS — R2681 Unsteadiness on feet: Secondary | ICD-10-CM | POA: Diagnosis not present

## 2019-03-30 DIAGNOSIS — R2689 Other abnormalities of gait and mobility: Secondary | ICD-10-CM

## 2019-03-30 NOTE — Patient Instructions (Signed)
Aquatic Therapy: What to Expect!  Where:  [facility]   NOTE: You will receive an automated phone message [address]   reminding you of your appointment and it will say the  [place], [place]  [id number]    appointment is at the Rehab Center on [address] We are  [phone]     working to fix this - just know that you will meet us at        pool! How to Prepare: . Please make sure you drink 8 ounces of water about one hour prior to your pool session . A caregiver must attend the entire session with the patient.  The caregiver will be responsible for assisting with dressing as well as any toileting needs.  . Please arrive IN YOUR SUIT and a few minutes prior to your appointment - a health screen will be completed as you enter the Aquatic Center.  . Please make sure to attend to any toileting needs prior to entering the pool . Once on the pool deck your therapist will ask you to sign the Patient  Consent and Assignment of Benefits form . Your therapist may take your blood pressure prior to, during and after your session if indicated  About the pool: 1. Entering the pool Your therapist will assist you; there are multiple ways to enter including stairs with railings, a walk in ramp, a roll in chair and a mechanical lift. Your therapist will determine the most appropriate way for you. 2. Water temperature is usually between 86-87 degrees 3. There may be other swimmers in the pool at the same time   Contact Info:     Appointments: [facility]  All sessions are 45 minutes   [address]  [address]    Please call the [facility] if   [place], [place]  [phone]    you need to cancel or reschedule an appointment.  [phone]          Aquatic Therapy:  Keeping Everyone Safe!!!  We are so excited to be back in the pool for therapy and can't wait to see you in the water!! Having said that, we also want to make sure that we keep  you, your family member, and everyone one else safe.  We have been in touch with our national aquatic association as well as the CDC to develop safe guidelines for aquatic therapy. First, we want to assure you that the water is one of the safest places to be right now - chlorine and bromine kill the virus and the CDC states the virus cannot be transmitted in a pool; that's great news for us! Below are specific guidelines from the CDC that we will ask you and your caregiver to follow when coming to the  Aquatic Center for therapy. 1. Please shower AT HOME and COME IN YOUR SUIT and cover up to the pool. 2. Please ensure that you are ON TIME and ready (meaning that you are in your suit and ready to enter the pool) for your appointment - all of our pool appointments are full and there is a waiting list. If you are late, we cannot extend your time in the pool.   3. Locker rooms are open but limited to 4 people at a time. At the end of your session, you can either change into dry clothes in the locker room or plan to leave in your suit /cover up   and change at home. If you require assistance with this, your caregiver will need to provide that assistance.  4. Follow the Aquatic Center's guidelines to use bathroom/locker room facilities. Signs are posted to provide guidelines for you.  5. The Aquatic Center staff will complete a health screen for you and caregiver as you enter the building.  Once this is completed, PLEASE PROCEED DIRECTLY TO THE POOL DECK. We will be waiting for you there! 6. Masks:  your caregiver must wear a mask at all times. The CDC recommends that patients and therapists wear their masks until we enter the water and then put them back on as we leave the water. PLEASE BRING A PLASTIC BAG TO STORE YOUR MASK IN WHILE WE ARE IN THE POOL.   Additional safety measures: 1. The Aquatic Center will be practicing social distancing, wearing masks and implementing a stringent cleaning program.   2. We will only be using hard surface equipment and we will be cleaning it between all patients. 3. We will be cleaning hand rails used to enter the pool and chairs that your caregiver might use. 4. We as employees of [employer] must complete a screening every day we work prior to our shift. 5. We are only offering aquatic therapy to patients who have been determined to be at low risk for the virus - we are happy to share that screen with you if you are interested.  We are excited to be able to offer aquatic therapy again in addition to services at our outpatient clinic - thank you for the opportunity to serve you!  Suzanne Dilday, PT    Karen Pulaski, OTR/L    10/12/18  

## 2019-03-31 ENCOUNTER — Telehealth: Payer: Self-pay | Admitting: Physical Therapy

## 2019-03-31 DIAGNOSIS — R269 Unspecified abnormalities of gait and mobility: Secondary | ICD-10-CM

## 2019-03-31 NOTE — Telephone Encounter (Signed)
Thank you for the order for her rolling walker, however the DME company will not accept it as written for her to get a rollator. If you agree, please resubmit an order that states: rolling walker with 4 wheels and a seat.   Thank you for your time,  Willow Ora, PTA, [place] [facility] [address] [place], [place] [address] [phone] 03/31/19, 7:45 AM

## 2019-03-31 NOTE — Therapy (Signed)
[facility] [address] [place], [name], [address] Phone: [phone]   Fax:  [phone]  Physical Therapy Treatment  Patient Details  Name: Julie [name] MRN: [id number] Date of Birth: 1932-07-24 Referring Provider (PT): Dr. Marcial Pacas   Encounter Date: 03/30/2019  PT End of Session - 03/30/19 0806    Visit Number  6    Number of Visits  13   renewal to be completed for 4 additional land visits and 4 aquatic visits   Date for PT Re-Evaluation  04/24/19    Authorization Type  Medicare/Aetna    Authorization Time Period  02-23-19 - 05-24-19    PT Start Time  0802    PT Stop Time  0844    PT Time Calculation (min)  42 min    Equipment Utilized During Treatment  Gait belt    Activity Tolerance  Patient tolerated treatment well;No increased pain;Patient limited by fatigue    Behavior During Therapy  [facility] for tasks assessed/performed       Past Medical History:  Diagnosis Date  . Anemia   . Anxiety    takes Klonopin at bedtime  . Arthritis    takes Methotrexate weekly and Prednisone  . Blood transfusion    no abnormal reaction noted  . Cancer (HCC)    breast   . Cataracts, bilateral    immature  . Complication of anesthesia    wakes up during surgery  . Depression   . Diabetes mellitus without complication ([place])   . Dyspnea    with exertion since chemo   . Fibromyalgia    takes Lyrica daily  . GERD (gastroesophageal reflux disease)    takes Nexium daily  . Glaucoma   . Hard of hearing   . Heart murmur   . Histoplasmosis   . History of colon polyps   . History of gout   . History of hiatal hernia   . History of shingles   . Hyperlipidemia   . Hypertension    takes Amlodipine,Metoprolol,and Losartan daily  . Incontinence of bowel   . Interstitial cystitis   . Joint pain   . Low back pain   . Nocturia   . Peripheral neuropathy   . Peripheral neuropathy   . Personal history of chemotherapy    . Personal history of radiation therapy   . PONV (postoperative nausea and vomiting)   . Ulcer 1970   duodenal  . Urinary frequency   . Urinary urgency   . Weakness    numbness in both hands and feet    Past Surgical History:  Procedure Laterality Date  . ABDOMINAL HYSTERECTOMY    . accupuncture  3 yrs ago  . ANKLE FUSION Left 05/10/2014   Procedure: LEFT ANKLE ARTHRODESIS ;  Surgeon: Wylene Simmer, MD;  Location: [facility];  Service: Orthopedics;  Laterality: Left;  . ANKLE SURGERY Left   . arm surgery Left    radius  . bladder tacked     . BREAST LUMPECTOMY Left   . CERVICAL FUSION    . CHOLECYSTECTOMY    . COLONOSCOPY    . CYSTOCELE REPAIR    . DILATION AND CURETTAGE OF UTERUS    . ESOPHAGOGASTRODUODENOSCOPY    . PORT A CATH REVISION N/A 12/27/2015   Procedure: PORT A CATH REVISION;  Surgeon: Rolm Bookbinder, MD;  Location: [facility];  Service: General;  Laterality: N/A;  . PORT-A-CATH REMOVAL N/A 03/25/2016   Procedure:  REMOVAL PORT-A-CATH;  Surgeon: Rolm Bookbinder, MD;  Location: [facility];  Service: General;  Laterality: N/A;  . PORTACATH PLACEMENT Right 12/27/2015   Procedure: INSERTION PORT-A-CATH WITH ULTRASOUND ;  Surgeon: Rolm Bookbinder, MD;  Location: [facility];  Service: General;  Laterality: Right;  . RADIOACTIVE SEED GUIDED PARTIAL MASTECTOMY WITH AXILLARY SENTINEL LYMPH NODE BIOPSY Left 11/14/2015   Procedure: RADIOACTIVE SEED GUIDED LEFT BREAST LUMPECTOMY WITH AXILLARY SENTINEL LYMPH NODE BIOPSY;  Surgeon: Rolm Bookbinder, MD;  Location: [facility];  Service: General;  Laterality: Left;  RADIOACTIVE SEED GUIDED LEFT BREAST LUMPECTOMY WITH AXILLARY SENTINEL LYMPH NODE BIOPSY  . SALPINGOOPHORECTOMY Bilateral   . TOTAL KNEE ARTHROPLASTY Right 02/28/2015   Procedure: RIGHT TOTAL KNEE ARTHROPLASTY;  Surgeon: Rod Can, MD;  [facility];  Service: Orthopedics;  Laterality: Right;  . UPPER GASTROINTESTINAL ENDOSCOPY     . VAGINAL HYSTERECTOMY      There were no vitals filed for this visit.  Subjective Assessment - 03/30/19 0805    Subjective  No new complaitns. No falls. Still on the Prednisone drops.    Pertinent History  OA, h/o breast cancer, DM, fibromyalgia, HTN, peripheral neuropathy, dyspnea    Limitations  Standing;Walking;House hold activities    Patient Stated Goals  "I just want pain relief"    Currently in Pain?  Yes    Pain Score  7     Pain Location  Hip    Pain Orientation  Right    Pain Descriptors / Indicators  Aching;Sore;Sharp    Pain Type  Chronic pain    Pain Radiating Towards  down right leg    Pain Onset  More than a month ago    Pain Frequency  Constant    Aggravating Factors   unknown    Pain Relieving Factors  Tramadol, heat         03/30/19 0815  Transfers  Transfers Sit to Stand;Stand to Sit  Sit to Stand 5: Supervision;With upper extremity assist;From chair/3-in-1;From bed  Stand to Sit 5: Supervision;With upper extremity assist;To chair/3-in-1;To bed  Ambulation/Gait  Ambulation/Gait Yes  Ambulation/Gait Assistance 5: Supervision  Ambulation/Gait Assistance Details cues on posture, walker position and for increased step length with gait.   Ambulation Distance (Feet)  (around gym with session)  Assistive device Rolling walker  Gait Pattern Step-through pattern;Antalgic;Lateral trunk lean to left  Ambulation Surface Level;Indoor  Self-Care  Self-Care Other Self-Care Comments  Other Self-Care Comments  provided aquatic information to pt, her schedule which indicates which are aquatic vs land appts and addressed any questions/concerns she had. pt's 1st aquatic appt is this coming Monday 04/03/19.   Neuro Re-ed   Neuro Re-ed Details  for balance/muscle re-ed: on airex with feet hip width apart no UE support-EC no head movements, progressing towards EC head movements left<>right, then up<>down, min guard to min assist for balance; on blue mat in parallel bars-  side stepping left<>right, marching fwd/bwd for 3-4 laps each with light UE support on bars, cues on form, weight shifting and technique.   Knee/Hip Exercises: Aerobic  Other Aerobic Nustep UE/LE level 2.0 for 8 mintues with goal >/=50 rpm for strenthening and activity tolerance.         PT Short Term Goals - 03/24/19 1716      PT SHORT TERM GOAL #1   Title  same as LTG's        PT Long Term Goals - 03/23/19 0723      PT LONG  TERM GOAL #1   Title  Improve Berg balance test score from 41/56 to >/= 46/56 to decrease fall risk. Upgraded LTG - incr. score from 45/56 to >/= 48/56 for reduced fall risk    Baseline  41/56 on 02-23-19; 45/56 on 03-23-19    Time  4    Period  Weeks    Status  Revised    Target Date  04/24/19      PT LONG TERM GOAL #2   Title  Increase gait velocity from 1.69 ft/sec with SPC to >/= 2.1 ft/sec with SPC for incr. gait efficiency.    Baseline  15.66 secs = 2.09 ft/sec with SPC on 03-23-19    Time  4    Period  Weeks    Status  Achieved      PT LONG TERM GOAL #3   Title  Improve TUG score from 18.35 secs to </= 15.0 secs with SPC for reduced fall risk.  REVISED LTG= improve TUG score from 22.97 secs with cane to </= 17 secs with cane    Baseline  18.35 secs with SPC - 02-23-19;  22.97 secs with SPC -- 03-23-19    Time  4    Period  Weeks    Status  Revised    Target Date  04/24/19      PT LONG TERM GOAL #4   Title  Pt will amb.300' with RW (or rollator) with supervision for incr. community accessibility.    Baseline  100' with [place] on 02-23-19;  350' with RW (03-23-19)    Time  4    Period  Weeks    Status  Achieved      PT LONG TERM GOAL #5   Title  Independent in HEP for balance and strengthening exs.    Baseline  pt reports she is walking in the home for exercise, not doing the specific exercises "because I'm forgetful"    Time  4    Period  Weeks    Status  Partially Met      Additional Long Term Goals   Additional Long Term Goals  Yes       PT LONG TERM GOAL #6   Title  Independent in aquatic HEP for participation upon D/C from PT.    Time  4    Period  Weeks    Status  New    Target Date  04/24/19      PT LONG TERM GOAL #7   Title  Increase gait velocity from 2.1 ft/sec with SPC to >/= 2.5 ft/sec with SPC for incr. gait efficiency.    Baseline  2.1 ft/sec with SPC on 03-23-19    Time  4    Period  Weeks    Status  New    Target Date  04/24/19         03/30/19 0806  Plan  Clinical Impression Statement Today's skilled session continued to focus on strengthening and balance reactions with no increase in pain reported. The pt is making progress toward goals and should benefit from continued PT to progress toward unmet goals.  Personal Factors and Comorbidities Behavior Pattern;Fitness;Comorbidity 2;Time since onset of injury/illness/exacerbation;Transportation  Comorbidities h/o breast cancer, DM, fibromyalgia, HTN, periperal neuropathy, and dyspnea; OA; chronic low back pain at a 7-8/10 after session.  Examination-Activity Limitations Stairs;Stand;Squat;Locomotion Level;Transfers  Examination-Participation Restrictions Meal Prep;Cleaning;Community Activity;Interpersonal Relationship;Shop  Pt will benefit from skilled therapeutic intervention in order to improve on the following deficits Abnormal gait;Decreased activity  tolerance;Decreased balance;Decreased strength;Pain;Impaired sensation  Stability/Clinical Decision Making Evolving/Moderate complexity  Rehab Potential Good  PT Frequency 2x / week (1 land visit/ 1 aquatic visit per week)  PT Duration 4 weeks (will renew if pt makes progress)  PT Treatment/Interventions ADLs/Self Care Home Management;Aquatic Therapy;DME Instruction;Gait training;Stair training;Therapeutic activities;Therapeutic exercise;Balance training;Neuromuscular re-education;Patient/family education;Manual techniques;Moist Heat  PT Next Visit Plan continue with use of rollator - pt to take script to  [place] (have requested a new script for 4 wheel walker with seat from MD);  continue to work on strengthening. begin to address balance.  PT Home Exercise Plan balance  Consulted and Agree with Plan of Care Patient;Family member/caregiver  Family Member Consulted daughter          Patient will benefit from skilled therapeutic intervention in order to improve the following deficits and impairments:  Abnormal gait, Decreased activity tolerance, Decreased balance, Decreased strength, Pain, Impaired sensation  Visit Diagnosis: Muscle weakness (generalized)  Other abnormalities of gait and mobility  Unsteadiness on feet  Gait abnormality     Problem List Patient Active Problem List   Diagnosis Date Noted  . Gait abnormality 01/09/2019  . Paresthesia 01/09/2019  . Near syncope   . Weakness   . Essential hypertension   . Generalized weakness 12/13/2018  . Syncope 12/13/2018  . Peripheral neuropathy 06/08/2016  . Low back pain without sciatica 06/08/2016  . Abnormality of gait 06/08/2016  . Anemia due to antineoplastic chemotherapy 03/06/2016  . Hypersensitivity reaction 01/07/2016  . Port catheter in place 01/03/2016  . Malignant neoplasm of upper outer quadrant of female breast ([place]) 10/30/2015  . Primary osteoarthritis of right knee 02/28/2015  . Post-traumatic arthritis of ankle 05/10/2014  . MGUS (monoclonal gammopathy of unknown significance) 02/17/2012  . Diverticulosis of colon (without mention of hemorrhage) 01/21/2011  . Family history of malignant neoplasm of gastrointestinal tract 01/21/2011  . Special screening for malignant neoplasms, colon 01/21/2011  . Other symptoms involving digestive system(787.99) 01/21/2011    Willow Ora, PTA, [facility] [address] [place],  [address] [phone] 03/31/19, 1:08 PM   Name: VONITA CALLOWAY MRN: [id number] Date of Birth: Jun 08, 1932

## 2019-04-03 ENCOUNTER — Ambulatory Visit: Payer: Medicare Other | Admitting: Physical Therapy

## 2019-04-03 NOTE — Telephone Encounter (Signed)
Order was placed for rolling walker with 4 wheels and a seat.

## 2019-04-06 ENCOUNTER — Ambulatory Visit: Payer: Medicare Other | Admitting: Physical Therapy

## 2019-04-06 ENCOUNTER — Other Ambulatory Visit: Payer: Self-pay

## 2019-04-06 ENCOUNTER — Encounter: Payer: Self-pay | Admitting: Physical Therapy

## 2019-04-06 DIAGNOSIS — R2681 Unsteadiness on feet: Secondary | ICD-10-CM | POA: Diagnosis not present

## 2019-04-06 DIAGNOSIS — M6281 Muscle weakness (generalized): Secondary | ICD-10-CM

## 2019-04-06 DIAGNOSIS — R269 Unspecified abnormalities of gait and mobility: Secondary | ICD-10-CM | POA: Diagnosis not present

## 2019-04-06 DIAGNOSIS — R2689 Other abnormalities of gait and mobility: Secondary | ICD-10-CM | POA: Diagnosis not present

## 2019-04-06 NOTE — Therapy (Signed)
[facility] [address] [place], [name], [address] Phone: [phone]   Fax:  [phone]  Physical Therapy Treatment  Patient Details  Name: Julie [name] MRN: [id number] Date of Birth: [birth year] Referring Provider (PT): Dr. Marcial Pacas   Encounter Date: 04/06/2019  PT End of Session - 04/06/19 0810    Visit Number  7    Number of Visits  13   renewal to be completed for 4 additional land visits and 4 aquatic visits   Date for PT Re-Evaluation  04/24/19    Authorization Type  Medicare/Aetna    Authorization Time Period  02-23-19 - 05-24-19    PT Start Time  0802    PT Stop Time  0844    PT Time Calculation (min)  42 min    Equipment Utilized During Treatment  Gait belt    Activity Tolerance  Patient tolerated treatment well;No increased pain;Patient limited by fatigue    Behavior During Therapy  [facility] for tasks assessed/performed       Past Medical History:  Diagnosis Date  . Anemia   . Anxiety    takes Klonopin at bedtime  . Arthritis    takes Methotrexate weekly and Prednisone  . Blood transfusion    no abnormal reaction noted  . Cancer (HCC)    breast   . Cataracts, bilateral    immature  . Complication of anesthesia    wakes up during surgery  . Depression   . Diabetes mellitus without complication ([place])   . Dyspnea    with exertion since chemo   . Fibromyalgia    takes Lyrica daily  . GERD (gastroesophageal reflux disease)    takes Nexium daily  . Glaucoma   . Hard of hearing   . Heart murmur   . Histoplasmosis   . History of colon polyps   . History of gout   . History of hiatal hernia   . History of shingles   . Hyperlipidemia   . Hypertension    takes Amlodipine,Metoprolol,and Losartan daily  . Incontinence of bowel   . Interstitial cystitis   . Joint pain   . Low back pain   . Nocturia   . Peripheral neuropathy   . Peripheral neuropathy   . Personal history of chemotherapy    . Personal history of radiation therapy   . PONV (postoperative nausea and vomiting)   . Ulcer 1970   duodenal  . Urinary frequency   . Urinary urgency   . Weakness    numbness in both hands and feet    Past Surgical History:  Procedure Laterality Date  . ABDOMINAL HYSTERECTOMY    . accupuncture  3 yrs ago  . ANKLE FUSION Left 05/10/2014   Procedure: LEFT ANKLE ARTHRODESIS ;  Surgeon: Wylene Simmer, MD;  Location: [facility];  Service: Orthopedics;  Laterality: Left;  . ANKLE SURGERY Left   . arm surgery Left    radius  . bladder tacked     . BREAST LUMPECTOMY Left   . CERVICAL FUSION    . CHOLECYSTECTOMY    . COLONOSCOPY    . CYSTOCELE REPAIR    . DILATION AND CURETTAGE OF UTERUS    . ESOPHAGOGASTRODUODENOSCOPY    . PORT A CATH REVISION N/A 12/27/2015   Procedure: PORT A CATH REVISION;  Surgeon: Rolm Bookbinder, MD;  Location: [facility];  Service: General;  Laterality: N/A;  . PORT-A-CATH REMOVAL N/A 03/25/2016   Procedure:  REMOVAL PORT-A-CATH;  Surgeon: Rolm Bookbinder, MD;  [facility];  Service: General;  Laterality: N/A;  . PORTACATH PLACEMENT Right 12/27/2015   Procedure: INSERTION PORT-A-CATH WITH ULTRASOUND ;  Surgeon: Rolm Bookbinder, MD;  Location: [facility];  Service: General;  Laterality: Right;  . RADIOACTIVE SEED GUIDED PARTIAL MASTECTOMY WITH AXILLARY SENTINEL LYMPH NODE BIOPSY Left 11/14/2015   Procedure: RADIOACTIVE SEED GUIDED LEFT BREAST LUMPECTOMY WITH AXILLARY SENTINEL LYMPH NODE BIOPSY;  Surgeon: Rolm Bookbinder, MD;  Location: [facility];  Service: General;  Laterality: Left;  RADIOACTIVE SEED GUIDED LEFT BREAST LUMPECTOMY WITH AXILLARY SENTINEL LYMPH NODE BIOPSY  . SALPINGOOPHORECTOMY Bilateral   . TOTAL KNEE ARTHROPLASTY Right 02/28/2015   Procedure: RIGHT TOTAL KNEE ARTHROPLASTY;  Surgeon: Rod Can, MD;  [facility];  Service: Orthopedics;  Laterality: Right;  . UPPER GASTROINTESTINAL ENDOSCOPY     . VAGINAL HYSTERECTOMY      There were no vitals filed for this visit.  Subjective Assessment - 04/06/19 0807    Subjective  Reports transportation did not pick her up this morning, did not even call her. Missed the pool Monday because she was having an upset stomach. Continues to express concerns over getting in the pool- worried about her eye getting wet as she is on predinsone for an infection, worried about being wet for awhile while waiting on transportation.    Pertinent History  OA, h/o breast cancer, DM, fibromyalgia, HTN, peripheral neuropathy, dyspnea    Limitations  Standing;Walking;House hold activities    Diagnostic tests  MRI was done approx. 1 month ago    Patient Stated Goals  "I just want pain relief"    Currently in Pain?  Yes    Pain Score  10-Worst pain ever    Pain Location  Hip    Pain Orientation  Right    Pain Descriptors / Indicators  Aching;Sharp;Sore    Pain Type  Chronic pain    Pain Radiating Towards  down right leg    Pain Onset  More than a month ago    Pain Frequency  Constant    Aggravating Factors   unknown    Pain Relieving Factors  Tramadol, heat          [facility] Adult PT Treatment/Exercise - 04/06/19 0816      Transfers   Transfers  Sit to Stand;Stand to Sit    Sit to Stand  5: Supervision;With upper extremity assist;From chair/3-in-1;From bed    Stand to Sit  5: Supervision;With upper extremity assist;To chair/3-in-1;To bed      Ambulation/Gait   Ambulation/Gait  Yes    Ambulation/Gait Assistance  5: Supervision    Ambulation/Gait Assistance Details  cues for hand placement on brakes of rollator and rollator position with gait.     Ambulation Distance (Feet)  250 Feet   x1, plus around gym with session   Assistive device  Rollator    Gait Pattern  Step-through pattern;Antalgic;Lateral trunk lean to left    Ambulation Surface  Level;Indoor      High Level Balance   High Level Balance Activities  Side stepping;Marching  forwards;Marching backwards;Tandem walking   tandem fwd/bwd   High Level Balance Comments  in parallel bars with UE support for 3 laps each way. cues on form and technique.       Exercises   Other Exercises   seated at edge of mat: sit<>stands with yoga block between knees, then with red band around knees (cues to keep  it pulled tight) for 10 reps each. minimal UE support needed. cues for full upright standing and slow, controlled descent with sitting down.       Knee/Hip Exercises: Aerobic   Other Aerobic  Nustep UE/LE level 3.0 for 8 mintues with goal >/=50 rpm for strenthening and activity tolerance concurrent with moist hot pack to low back for decreased pain.       Moist Heat Therapy   Number Minutes Moist Heat  8 Minutes   concurrent with Nustep   Moist Heat Location  Lumbar Spine           PT Short Term Goals - 03/24/19 1716      PT SHORT TERM GOAL #1   Title  same as LTG's        PT Long Term Goals - 03/23/19 0723      PT LONG TERM GOAL #1   Title  Improve Berg balance test score from 41/56 to >/= 46/56 to decrease fall risk. Upgraded LTG - incr. score from 45/56 to >/= 48/56 for reduced fall risk    Baseline  41/56 on 02-23-19; 45/56 on 03-23-19    Time  4    Period  Weeks    Status  Revised    Target Date  04/24/19      PT LONG TERM GOAL #2   Title  Increase gait velocity from 1.69 ft/sec with SPC to >/= 2.1 ft/sec with SPC for incr. gait efficiency.    Baseline  15.66 secs = 2.09 ft/sec with SPC on 03-23-19    Time  4    Period  Weeks    Status  Achieved      PT LONG TERM GOAL #3   Title  Improve TUG score from 18.35 secs to </= 15.0 secs with SPC for reduced fall risk.  REVISED LTG= improve TUG score from 22.97 secs with cane to </= 17 secs with cane    Baseline  18.35 secs with SPC - 02-23-19;  22.97 secs with SPC -- 03-23-19    Time  4    Period  Weeks    Status  Revised    Target Date  04/24/19      PT LONG TERM GOAL #4   Title  Pt will amb.300'  with RW (or rollator) with supervision for incr. community accessibility.    Baseline  100' with [place] on 02-23-19;  350' with RW (03-23-19)    Time  4    Period  Weeks    Status  Achieved      PT LONG TERM GOAL #5   Title  Independent in HEP for balance and strengthening exs.    Baseline  pt reports she is walking in the home for exercise, not doing the specific exercises "because I'm forgetful"    Time  4    Period  Weeks    Status  Partially Met      Additional Long Term Goals   Additional Long Term Goals  Yes      PT LONG TERM GOAL #6   Title  Independent in aquatic HEP for participation upon D/C from PT.    Time  4    Period  Weeks    Status  New    Target Date  04/24/19      PT LONG TERM GOAL #7   Title  Increase gait velocity from 2.1 ft/sec with SPC to >/= 2.5 ft/sec with SPC for incr. gait efficiency.  Baseline  2.1 ft/sec with SPC on 03-23-19    Time  4    Period  Weeks    Status  New    Target Date  04/24/19            Plan - 04/06/19 0810    Clinical Impression Statement  Today's skilled session continued to focus on gait training with rollator, LE strengthening and balance reactions. Provided pt with new rollator order that states 4 wheels and seat. Pt reporting decreased pain to 7/10 after session. The pt is progressing slowly toward goals and should benefit from continued PT to progress toward unmet goals.    Personal Factors and Comorbidities  Behavior Pattern;Fitness;Comorbidity 2;Time since onset of injury/illness/exacerbation;Transportation    Comorbidities  h/o breast cancer, DM, fibromyalgia, HTN, periperal neuropathy, and dyspnea; OA; chronic low back pain at a 7-8/10 after session.    Examination-Activity Limitations  Stairs;Stand;Squat;Locomotion Level;Transfers    Examination-Participation Restrictions  Meal Prep;Cleaning;Community Activity;Interpersonal Relationship;Shop    Stability/Clinical Decision Making  Evolving/Moderate complexity    Rehab  Potential  Good    PT Frequency  2x / week   1 land visit/ 1 aquatic visit per week   PT Duration  4 weeks   will renew if pt makes progress   PT Treatment/Interventions  ADLs/Self Care Home Management;Aquatic Therapy;DME Instruction;Gait training;Stair training;Therapeutic activities;Therapeutic exercise;Balance training;Neuromuscular re-education;Patient/family education;Manual techniques;Moist Heat    PT Next Visit Plan  continue with use of rollator;  continue to work on strengthening and  balance reactions.    PT Home Exercise Plan  balance    Consulted and Agree with Plan of Care  Patient;Family member/caregiver    Family Member Consulted  daughter       Patient will benefit from skilled therapeutic intervention in order to improve the following deficits and impairments:  Abnormal gait, Decreased activity tolerance, Decreased balance, Decreased strength, Pain, Impaired sensation  Visit Diagnosis: Gait abnormality  Muscle weakness (generalized)  Other abnormalities of gait and mobility  Unsteadiness on feet     Problem List Patient Active Problem List   Diagnosis Date Noted  . Gait abnormality 01/09/2019  . Paresthesia 01/09/2019  . Near syncope   . Weakness   . Essential hypertension   . Generalized weakness 12/13/2018  . Syncope 12/13/2018  . Peripheral neuropathy 06/08/2016  . Low back pain without sciatica 06/08/2016  . Abnormality of gait 06/08/2016  . Anemia due to antineoplastic chemotherapy 03/06/2016  . Hypersensitivity reaction 01/07/2016  . Port catheter in place 01/03/2016  . Malignant neoplasm of upper outer quadrant of female breast ([place]) 10/30/2015  . Primary osteoarthritis of right knee 02/28/2015  . Post-traumatic arthritis of ankle 05/10/2014  . MGUS (monoclonal gammopathy of unknown significance) 02/17/2012  . Diverticulosis of colon (without mention of hemorrhage) 01/21/2011  . Family history of malignant neoplasm of gastrointestinal tract  01/21/2011  . Special screening for malignant neoplasms, colon 01/21/2011  . Other symptoms involving digestive system(787.99) 01/21/2011    Willow Ora, PTA, [facility] [address] [place], [place] [address] [phone] 04/06/19, 9:51 AM   Name: Julie [name] MRN: [id number] Date of Birth: 07/14/1932

## 2019-04-07 ENCOUNTER — Ambulatory Visit: Payer: Medicare Other | Admitting: Physical Therapy

## 2019-04-10 ENCOUNTER — Ambulatory Visit: Payer: Medicare Other | Admitting: Physical Therapy

## 2019-04-10 ENCOUNTER — Telehealth: Payer: Self-pay

## 2019-04-10 NOTE — Telephone Encounter (Signed)
Patient calls stating her breast was itchy and she scratched it, has a raised area near the nipple.  Instructed patient to apply hydrocortisone to the area and watch.  If it doesn't resolve call back in a couple of days to  Call back.

## 2019-04-11 DIAGNOSIS — H2000 Unspecified acute and subacute iridocyclitis: Secondary | ICD-10-CM | POA: Diagnosis not present

## 2019-04-12 ENCOUNTER — Ambulatory Visit: Payer: Medicare Other | Admitting: Physical Therapy

## 2019-04-13 ENCOUNTER — Ambulatory Visit: Payer: Medicare Other | Admitting: Physical Therapy

## 2019-04-13 DIAGNOSIS — H6120 Impacted cerumen, unspecified ear: Secondary | ICD-10-CM | POA: Diagnosis not present

## 2019-04-13 DIAGNOSIS — M4800 Spinal stenosis, site unspecified: Secondary | ICD-10-CM | POA: Diagnosis not present

## 2019-04-13 DIAGNOSIS — M7051 Other bursitis of knee, right knee: Secondary | ICD-10-CM | POA: Insufficient documentation

## 2019-04-13 DIAGNOSIS — M0579 Rheumatoid arthritis with rheumatoid factor of multiple sites without organ or systems involvement: Secondary | ICD-10-CM | POA: Diagnosis not present

## 2019-04-14 ENCOUNTER — Ambulatory Visit: Payer: Medicare Other | Admitting: Physical Therapy

## 2019-04-14 ENCOUNTER — Telehealth: Payer: Self-pay

## 2019-04-14 NOTE — Telephone Encounter (Signed)
I called pt, and she wants me to call Dr. Roxan Hockey at [phone]. I called, office is closed, will let my nurse call them on Monday, and ask Dr. [name] call me back. Thanks   Truitt Merle MD

## 2019-04-14 NOTE — Telephone Encounter (Signed)
Patient calls back to let us know that the itchy lump she had on breast is gone.   She saw her medical doctor  Dr. [name] and she wants to try her on Humara but wants to know if that is okay with Dr. Burr Medico.  Dr. [name] [phone]

## 2019-04-17 ENCOUNTER — Ambulatory Visit: Payer: Medicare Other | Admitting: Physical Therapy

## 2019-04-18 DIAGNOSIS — M6281 Muscle weakness (generalized): Secondary | ICD-10-CM | POA: Insufficient documentation

## 2019-04-18 DIAGNOSIS — M7061 Trochanteric bursitis, right hip: Secondary | ICD-10-CM | POA: Diagnosis not present

## 2019-04-18 DIAGNOSIS — Z96651 Presence of right artificial knee joint: Secondary | ICD-10-CM | POA: Diagnosis not present

## 2019-04-18 DIAGNOSIS — M7051 Other bursitis of knee, right knee: Secondary | ICD-10-CM | POA: Diagnosis not present

## 2019-04-24 ENCOUNTER — Ambulatory Visit: Payer: Medicare Other | Admitting: Physical Therapy

## 2019-04-26 ENCOUNTER — Ambulatory Visit: Payer: Medicare Other | Admitting: Physical Therapy

## 2019-04-27 ENCOUNTER — Other Ambulatory Visit: Payer: Self-pay

## 2019-04-27 ENCOUNTER — Ambulatory Visit: Payer: Medicare Other | Attending: Neurology | Admitting: Physical Therapy

## 2019-04-27 ENCOUNTER — Ambulatory Visit: Payer: Medicare Other | Admitting: Physical Therapy

## 2019-04-27 DIAGNOSIS — R2689 Other abnormalities of gait and mobility: Secondary | ICD-10-CM | POA: Diagnosis present

## 2019-04-27 DIAGNOSIS — M6281 Muscle weakness (generalized): Secondary | ICD-10-CM | POA: Diagnosis present

## 2019-04-27 DIAGNOSIS — M25551 Pain in right hip: Secondary | ICD-10-CM | POA: Insufficient documentation

## 2019-04-27 DIAGNOSIS — R2681 Unsteadiness on feet: Secondary | ICD-10-CM | POA: Insufficient documentation

## 2019-04-28 ENCOUNTER — Encounter: Payer: Self-pay | Admitting: Physical Therapy

## 2019-04-28 ENCOUNTER — Ambulatory Visit: Payer: Medicare Other | Admitting: Physical Therapy

## 2019-04-28 NOTE — Therapy (Signed)
[facility] [address] [place], [name], [address] Phone: [phone]   Fax:  [phone]  Physical Therapy Treatment  Patient Details  Name: Julie [name] MRN: [id number] Date of Birth: Oct 18, 1932 Referring Provider (PT): Dr. Marcial Pacas   Encounter Date: 04/27/2019  PT End of Session - 04/28/19 1702    Visit Number  8    Number of Visits  12   renewal to be completed for 4 additional land visits and 4 aquatic visits   Date for PT Re-Evaluation  05/28/19    Authorization Type  Medicare/Aetna    Authorization Time Period  02-23-19 - 05-24-19; 04-27-19 - 06-25-19    PT Start Time  0715    PT Stop Time  0800    PT Time Calculation (min)  45 min    Equipment Utilized During Treatment  Gait belt    Activity Tolerance  Patient tolerated treatment well;No increased pain;Patient limited by fatigue    Behavior During Therapy  [facility] for tasks assessed/performed       Past Medical History:  Diagnosis Date  . Anemia   . Anxiety    takes Klonopin at bedtime  . Arthritis    takes Methotrexate weekly and Prednisone  . Blood transfusion    no abnormal reaction noted  . Cancer (HCC)    breast   . Cataracts, bilateral    immature  . Complication of anesthesia    wakes up during surgery  . Depression   . Diabetes mellitus without complication ([place])   . Dyspnea    with exertion since chemo   . Fibromyalgia    takes Lyrica daily  . GERD (gastroesophageal reflux disease)    takes Nexium daily  . Glaucoma   . Hard of hearing   . Heart murmur   . Histoplasmosis   . History of colon polyps   . History of gout   . History of hiatal hernia   . History of shingles   . Hyperlipidemia   . Hypertension    takes Amlodipine,Metoprolol,and Losartan daily  . Incontinence of bowel   . Interstitial cystitis   . Joint pain   . Low back pain   . Nocturia   . Peripheral neuropathy   . Peripheral neuropathy   . Personal history  of chemotherapy   . Personal history of radiation therapy   . PONV (postoperative nausea and vomiting)   . Ulcer 1970   duodenal  . Urinary frequency   . Urinary urgency   . Weakness    numbness in both hands and feet    Past Surgical History:  Procedure Laterality Date  . ABDOMINAL HYSTERECTOMY    . accupuncture  3 yrs ago  . ANKLE FUSION Left 05/10/2014   Procedure: LEFT ANKLE ARTHRODESIS ;  Surgeon: Wylene Simmer, MD;  Location: [facility];  Service: Orthopedics;  Laterality: Left;  . ANKLE SURGERY Left   . arm surgery Left    radius  . bladder tacked     . BREAST LUMPECTOMY Left   . CERVICAL FUSION    . CHOLECYSTECTOMY    . COLONOSCOPY    . CYSTOCELE REPAIR    . DILATION AND CURETTAGE OF UTERUS    . ESOPHAGOGASTRODUODENOSCOPY    . PORT A CATH REVISION N/A 12/27/2015   Procedure: PORT A CATH REVISION;  Surgeon: Rolm Bookbinder, MD;  Location: [facility];  Service: General;  Laterality: N/A;  . PORT-A-CATH REMOVAL N/A 03/25/2016  Procedure: REMOVAL PORT-A-CATH;  Surgeon: Rolm Bookbinder, MD;  Location[facility];  Service: General;  Laterality: N/A;  . PORTACATH PLACEMENT Right 12/27/2015   Procedure: INSERTION PORT-A-CATH WITH ULTRASOUND ;  Surgeon: Rolm Bookbinder, MD;  Location: [facility];  Service: General;  Laterality: Right;  . RADIOACTIVE SEED GUIDED PARTIAL MASTECTOMY WITH AXILLARY SENTINEL LYMPH NODE BIOPSY Left 11/14/2015   Procedure: RADIOACTIVE SEED GUIDED LEFT BREAST LUMPECTOMY WITH AXILLARY SENTINEL LYMPH NODE BIOPSY;  Surgeon: Rolm Bookbinder, MD;  Location: [facility];  Service: General;  Laterality: Left;  RADIOACTIVE SEED GUIDED LEFT BREAST LUMPECTOMY WITH AXILLARY SENTINEL LYMPH NODE BIOPSY  . SALPINGOOPHORECTOMY Bilateral   . TOTAL KNEE ARTHROPLASTY Right 02/28/2015   Procedure: RIGHT TOTAL KNEE ARTHROPLASTY;  Surgeon: Rod Can, MD;  [facility];  Service: Orthopedics;  Laterality: Right;  . UPPER  GASTROINTESTINAL ENDOSCOPY    . VAGINAL HYSTERECTOMY      There were no vitals filed for this visit.                    [facility] Adult PT Treatment/Exercise - 04/28/19 0001      Ambulation/Gait   Ambulation/Gait  Yes    Ambulation/Gait Assistance  5: Supervision    Ambulation/Gait Assistance Details  cues for posture    Ambulation Distance (Feet)  350 Feet   rest period after 230'   Assistive device  Rollator    Gait Pattern  Step-through pattern    Ambulation Surface  Level;Indoor      Exercises   Exercises  Knee/Hip      Knee/Hip Exercises: Stretches   Other Knee/Hip Stretches  pt performed Rt iliotibial band stretch in standing 30 sec hold x 1 rep      Knee/Hip Exercises: Aerobic   Recumbent Bike  SciFit level 2.0 x 6" with UE's and LE's      Knee/Hip Exercises: Standing   Heel Raises  Both;1 set;10 reps    Forward Step Up  Both;1 set;10 reps;Hand Hold: 2;Step Height: 6"          Balance Exercises - 04/28/19 1655      Balance Exercises: Standing   Rockerboard  Anterior/posterior;10 reps;EO    Balance Beam  pt performed standing perpendicular on blue balance foam beam for improved hip strategy - no UE support used on // bars;  pt performed head turns EO 5 reps horizontally and 5 reps vertically     Other Standing Exercises  pt performed stepping over and back of black balance beam - with UE support prn 10 reps each;  tap ups to 1st step 10 reps each foot with UE support prn and then 5 reps with minimal UE support to 2nd step        PT Education - 04/28/19 1700    Education Details  educated pt in Rt IT band stretch in standing (RLE behind LLE and push Rt hip to Rt side);  also recommended to pt that she freeze a water bottle and then use for rolling on Rt hip and lateral thigh for Rt trochanteric bursitis and for IT band stretching    Person(s) Educated  Patient    Methods  Explanation;Demonstration    Comprehension  Verbalized understanding        PT Short Term Goals - 04/28/19 1709      PT SHORT TERM GOAL #1   Title  same as LTG's        PT Long Term Goals -  04/28/19 1709      PT LONG TERM GOAL #1   Title  Improve Berg balance test score from 41/56 to >/= 46/56 to decrease fall risk. Upgraded LTG - incr. score from 45/56 to >/= 48/56 for reduced fall risk    Baseline  41/56 on 02-23-19; 45/56 on 03-23-19    Time  4    Period  Weeks    Status  Revised      PT LONG TERM GOAL #2   Title  Increase gait velocity from 1.69 ft/sec with SPC to >/= 2.1 ft/sec with SPC for incr. gait efficiency.    Baseline  15.66 secs = 2.09 ft/sec with SPC on 03-23-19    Time  4    Period  Weeks    Status  Achieved      PT LONG TERM GOAL #3   Title  Improve TUG score from 18.35 secs to </= 15.0 secs with SPC for reduced fall risk.  REVISED LTG= improve TUG score from 22.97 secs with cane to </= 17 secs with cane    Baseline  18.35 secs with SPC - 02-23-19;  22.97 secs with SPC -- 03-23-19    Time  4    Period  Weeks    Status  Revised      PT LONG TERM GOAL #4   Title  Pt will amb.300' with RW (or rollator) with supervision for incr. community accessibility.    Baseline  100' with [place] on 02-23-19;  350' with RW (03-23-19)    Time  4    Period  Weeks    Status  Achieved      PT LONG TERM GOAL #5   Title  Independent in HEP for balance and strengthening exs.    Baseline  pt reports she is walking in the home for exercise, not doing the specific exercises "because I'm forgetful"    Time  4    Period  Weeks    Status  Partially Met      PT LONG TERM GOAL #6   Title  Independent in aquatic HEP for participation upon D/C from PT.    Baseline  pt decided to hold on aquatic therapy at this time due to cold weather - 04-27-19    Time  4    Period  Weeks    Status  Deferred      PT LONG TERM GOAL #7   Title  Increase gait velocity from 2.1 ft/sec with SPC to >/= 2.5 ft/sec with SPC for incr. gait efficiency.    Baseline  2.1 ft/sec with  SPC on 03-23-19    Time  4    Period  Weeks    Status  On-going    Target Date  05/24/19      PT LONG TERM GOAL #8   Title  Pt will report at least 50% improvement in Rt hip pain over Rt greater trochanter with palpation to indicate some resolution of bursitis.    Baseline  mod. to severe tenderness reported    Time  4    Period  Weeks    Status  New    Target Date  05/25/19            Plan - 04/28/19 1726    Personal Factors and Comorbidities  Behavior Pattern;Fitness;Comorbidity 2;Time since onset of injury/illness/exacerbation;Transportation    Comorbidities  h/o breast cancer, DM, fibromyalgia, HTN, periperal neuropathy, and dyspnea; OA; chronic low back pain at a  7-8/10 after session.    Examination-Activity Limitations  Stairs;Stand;Squat;Locomotion Level;Transfers    Examination-Participation Restrictions  Meal Prep;Cleaning;Community Activity;Interpersonal Relationship;Shop    Stability/Clinical Decision Making  Evolving/Moderate complexity    Rehab Potential  Good    PT Frequency  1x / week   1 land visit/ 1 aquatic visit per week   PT Duration  4 weeks   will renew if pt makes progress   PT Treatment/Interventions  ADLs/Self Care Home Management;Aquatic Therapy;DME Instruction;Gait training;Stair training;Therapeutic activities;Therapeutic exercise;Balance training;Neuromuscular re-education;Patient/family education;Manual techniques;Moist Heat;Iontophoresis 53m/ml Dexamethasone;Ultrasound    PT Next Visit Plan  Try ultrasound for Rt trochanteric bursitis (pt hesitant to agree to iontophoresis); continue with use of rollator;  continue to work on strengthening and  balance reactions. Plan is to renew for 4 visits (1x/week) per new PT order for Bursitis treatment ; needs to add on 2 more appts    PT Home Exercise Plan  balance    Consulted and Agree with Plan of Care  Patient;Family member/caregiver    Family Member Consulted  daughter       Patient will benefit from  skilled therapeutic intervention in order to improve the following deficits and impairments:  Abnormal gait, Decreased activity tolerance, Decreased balance, Decreased strength, Pain, Impaired sensation  Visit Diagnosis: Pain in right hip - Plan: PT plan of care cert/re-cert  Other abnormalities of gait and mobility - Plan: PT plan of care cert/re-cert  Muscle weakness (generalized) - Plan: PT plan of care cert/re-cert  Unsteadiness on feet - Plan: PT plan of care cert/re-cert     Problem List Patient Active Problem List   Diagnosis Date Noted  . Gait abnormality 01/09/2019  . Paresthesia 01/09/2019  . Near syncope   . Weakness   . Essential hypertension   . Generalized weakness 12/13/2018  . Syncope 12/13/2018  . Peripheral neuropathy 06/08/2016  . Low back pain without sciatica 06/08/2016  . Abnormality of gait 06/08/2016  . Anemia due to antineoplastic chemotherapy 03/06/2016  . Hypersensitivity reaction 01/07/2016  . Port catheter in place 01/03/2016  . Malignant neoplasm of upper outer quadrant of female breast ([place] 10/30/2015  . Primary osteoarthritis of right knee 02/28/2015  . Post-traumatic arthritis of ankle 05/10/2014  . MGUS (monoclonal gammopathy of unknown significance) 02/17/2012  . Diverticulosis of colon (without mention of hemorrhage) 01/21/2011  . Family history of malignant neoplasm of gastrointestinal tract 01/21/2011  . Special screening for malignant neoplasms, colon 01/21/2011  . Other symptoms involving digestive system(787.99) 01/21/2011    , LJenness Corner PT 04/28/2019, 5:29 PM  [facility] [place] [address]Phone: [phone]  Fax:  [phone] Name: Julie NEWBROUGHMRN: [id number] of Birth: [birth year]

## 2019-05-11 ENCOUNTER — Ambulatory Visit: Payer: Medicare Other | Admitting: Physical Therapy

## 2019-05-11 ENCOUNTER — Other Ambulatory Visit: Payer: Self-pay

## 2019-05-11 ENCOUNTER — Encounter: Payer: Self-pay | Admitting: Physical Therapy

## 2019-05-11 DIAGNOSIS — M25551 Pain in right hip: Secondary | ICD-10-CM | POA: Diagnosis not present

## 2019-05-11 DIAGNOSIS — M6281 Muscle weakness (generalized): Secondary | ICD-10-CM

## 2019-05-11 DIAGNOSIS — R2689 Other abnormalities of gait and mobility: Secondary | ICD-10-CM

## 2019-05-11 DIAGNOSIS — R2681 Unsteadiness on feet: Secondary | ICD-10-CM

## 2019-05-11 NOTE — Therapy (Signed)
[facility] [address] [place], [name], [address] Phone: [phone]   Fax:  [phone]  Physical Therapy Treatment  Patient Details  Name: Julie [name] MRN: [id number] Date of Birth: 06-21-1932 Referring Provider (PT): Dr. Marcial Pacas   Encounter Date: 05/11/2019  PT End of Session - 05/11/19 0807    Visit Number  9    Number of Visits  12   renewal to be completed for 4 additional land visits and 4 aquatic visits   Date for PT Re-Evaluation  05/28/19    Authorization Type  Medicare/Aetna    Authorization Time Period  02-23-19 - 05-24-19; 04-27-19 - 06-25-19    PT Start Time  0802    PT Stop Time  0841    PT Time Calculation (min)  39 min    Equipment Utilized During Treatment  Gait belt    Activity Tolerance  Patient tolerated treatment well;No increased pain;Patient limited by fatigue    Behavior During Therapy  [facility] for tasks assessed/performed       Past Medical History:  Diagnosis Date  . Anemia   . Anxiety    takes Klonopin at bedtime  . Arthritis    takes Methotrexate weekly and Prednisone  . Blood transfusion    no abnormal reaction noted  . Cancer (HCC)    breast   . Cataracts, bilateral    immature  . Complication of anesthesia    wakes up during surgery  . Depression   . Diabetes mellitus without complication ([place])   . Dyspnea    with exertion since chemo   . Fibromyalgia    takes Lyrica daily  . GERD (gastroesophageal reflux disease)    takes Nexium daily  . Glaucoma   . Hard of hearing   . Heart murmur   . Histoplasmosis   . History of colon polyps   . History of gout   . History of hiatal hernia   . History of shingles   . Hyperlipidemia   . Hypertension    takes Amlodipine,Metoprolol,and Losartan daily  . Incontinence of bowel   . Interstitial cystitis   . Joint pain   . Low back pain   . Nocturia   . Peripheral neuropathy   . Peripheral neuropathy   . Personal history  of chemotherapy   . Personal history of radiation therapy   . PONV (postoperative nausea and vomiting)   . Ulcer 1970   duodenal  . Urinary frequency   . Urinary urgency   . Weakness    numbness in both hands and feet    Past Surgical History:  Procedure Laterality Date  . ABDOMINAL HYSTERECTOMY    . accupuncture  3 yrs ago  . ANKLE FUSION Left 05/10/2014   Procedure: LEFT ANKLE ARTHRODESIS ;  Surgeon: Wylene Simmer, MD;  Location: [facility];  Service: Orthopedics;  Laterality: Left;  . ANKLE SURGERY Left   . arm surgery Left    radius  . bladder tacked     . BREAST LUMPECTOMY Left   . CERVICAL FUSION    . CHOLECYSTECTOMY    . COLONOSCOPY    . CYSTOCELE REPAIR    . DILATION AND CURETTAGE OF UTERUS    . ESOPHAGOGASTRODUODENOSCOPY    . PORT A CATH REVISION N/A 12/27/2015   Procedure: PORT A CATH REVISION;  Surgeon: Rolm Bookbinder, MD;  Location: [facility];  Service: General;  Laterality: N/A;  . PORT-A-CATH REMOVAL N/A 03/25/2016  Procedure: REMOVAL PORT-A-CATH;  Surgeon: Rolm Bookbinder, MD;  [facility];  Service: General;  Laterality: N/A;  . PORTACATH PLACEMENT Right 12/27/2015   Procedure: INSERTION PORT-A-CATH WITH ULTRASOUND ;  Surgeon: Rolm Bookbinder, MD;  Location: [facility];  Service: General;  Laterality: Right;  . RADIOACTIVE SEED GUIDED PARTIAL MASTECTOMY WITH AXILLARY SENTINEL LYMPH NODE BIOPSY Left 11/14/2015   Procedure: RADIOACTIVE SEED GUIDED LEFT BREAST LUMPECTOMY WITH AXILLARY SENTINEL LYMPH NODE BIOPSY;  Surgeon: Rolm Bookbinder, MD;  Location: [facility];  Service: General;  Laterality: Left;  RADIOACTIVE SEED GUIDED LEFT BREAST LUMPECTOMY WITH AXILLARY SENTINEL LYMPH NODE BIOPSY  . SALPINGOOPHORECTOMY Bilateral   . TOTAL KNEE ARTHROPLASTY Right 02/28/2015   Procedure: RIGHT TOTAL KNEE ARTHROPLASTY;  Surgeon: Rod Can, MD;  [facility];  Service: Orthopedics;  Laterality: Right;  . UPPER  GASTROINTESTINAL ENDOSCOPY    . VAGINAL HYSTERECTOMY      There were no vitals filed for this visit.  Subjective Assessment - 05/11/19 0805    Subjective  Reports the hip is better, denies pain "nothing to complain about". No falls. Has had a chair lift installed on her stairs at home for about 2 weeks now. Rheumatology wants her to try Humara for pain. She is not wanting to try it due to side effects.    Pertinent History  OA, h/o breast cancer, DM, fibromyalgia, HTN, peripheral neuropathy, dyspnea    Limitations  Standing;Walking;House hold activities    Diagnostic tests  MRI was done approx. 1 month ago    Patient Stated Goals  "I just want pain relief"    Currently in Pain?  No/denies    Pain Score  0-No pain             [facility] Adult PT Treatment/Exercise - 05/11/19 0808      Transfers   Transfers  Sit to Stand;Stand to Sit    Sit to Stand  5: Supervision;With upper extremity assist;From chair/3-in-1;From bed    Stand to Sit  5: Supervision;With upper extremity assist;To chair/3-in-1;To bed      Ambulation/Gait   Ambulation/Gait  Yes    Ambulation/Gait Assistance  5: Supervision    Ambulation/Gait Assistance Details  cues for upright posture and walker position with gait.     Ambulation Distance (Feet)  --   around gym with session.    Assistive device  Rollator    Gait Pattern  Step-through pattern;Trendelenburg    Ambulation Surface  Level;Indoor      High Level Balance   High Level Balance Activities  Side stepping;Marching forwards;Marching backwards    High Level Balance Comments  on blue mat in parallel bars with bil UE support:  3 laps each with cues on form and weight shifting.       Knee/Hip Exercises: Aerobic   Recumbent Bike  SciFit level 2.5 x 8 minutes with UE/LE's with goal >/= 50 rpm for strengthening and activity tolerance      Knee/Hip Exercises: Standing   Heel Raises  Both;1 set;10 reps;Limitations    Heel Raises Limitations  2# ankle weights with  cues on form/technique    Hip Abduction  AROM;Stengthening;Both;1 set;10 reps;Knee straight;Limitations    Abduction Limitations  2# weight with cues on ex form/technique    Hip Extension  AROM;Stengthening;Both;1 set;10 reps;Knee straight;Limitations    Extension Limitations  2# weight with cues on form and technique    Lateral Step Up  Both;1 set;10 reps;Hand Hold: 2;Step Height: 6";Limitations  Lateral Step Up Limitations  bil UE support with min guard assist    Forward Step Up  Both;1 set;10 reps;Hand Hold: 2;Step Height: 6";Limitations    Forward Step Up Limitations  bil UE support with min guard assist.      Modalities   Modalities  --          Balance Exercises - 05/11/19 0833      Balance Exercises: Standing   Standing Eyes Closed  Wide (BOA);Head turns;Foam/compliant surface;Other reps (comment);Limitations;30 secs      Balance Exercises: Standing   Standing Eyes Closed Limitations  on 1 inch foam with feet hip width apart and no UE support, min guard assist for balance. EC no head movements, progressing to EC head movements left<>right, up<>down           PT Short Term Goals - 04/28/19 1709      PT SHORT TERM GOAL #1   Title  same as LTG's        PT Long Term Goals - 04/28/19 1709      PT LONG TERM GOAL #1   Title  Improve Berg balance test score from 41/56 to >/= 46/56 to decrease fall risk. Upgraded LTG - incr. score from 45/56 to >/= 48/56 for reduced fall risk    Baseline  41/56 on 02-23-19; 45/56 on 03-23-19    Time  4    Period  Weeks    Status  Revised      PT LONG TERM GOAL #2   Title  Increase gait velocity from 1.69 ft/sec with SPC to >/= 2.1 ft/sec with SPC for incr. gait efficiency.    Baseline  15.66 secs = 2.09 ft/sec with SPC on 03-23-19    Time  4    Period  Weeks    Status  Achieved      PT LONG TERM GOAL #3   Title  Improve TUG score from 18.35 secs to </= 15.0 secs with SPC for reduced fall risk.  REVISED LTG= improve TUG score  from 22.97 secs with cane to </= 17 secs with cane    Baseline  18.35 secs with SPC - 02-23-19;  22.97 secs with SPC -- 03-23-19    Time  4    Period  Weeks    Status  Revised      PT LONG TERM GOAL #4   Title  Pt will amb.300' with RW (or rollator) with supervision for incr. community accessibility.    Baseline  100' with [place] on 02-23-19;  350' with RW (03-23-19)    Time  4    Period  Weeks    Status  Achieved      PT LONG TERM GOAL #5   Title  Independent in HEP for balance and strengthening exs.    Baseline  pt reports she is walking in the home for exercise, not doing the specific exercises "because I'm forgetful"    Time  4    Period  Weeks    Status  Partially Met      PT LONG TERM GOAL #6   Title  Independent in aquatic HEP for participation upon D/C from PT.    Baseline  pt decided to hold on aquatic therapy at this time due to cold weather - 04-27-19    Time  4    Period  Weeks    Status  Deferred      PT LONG TERM GOAL #7   Title  Increase  gait velocity from 2.1 ft/sec with SPC to >/= 2.5 ft/sec with SPC for incr. gait efficiency.    Baseline  2.1 ft/sec with SPC on 03-23-19    Time  4    Period  Weeks    Status  On-going    Target Date  05/24/19      PT LONG TERM GOAL #8   Title  Pt will report at least 50% improvement in Rt hip pain over Rt greater trochanter with palpation to indicate some resolution of bursitis.    Baseline  mod. to severe tenderness reported    Time  4    Period  Weeks    Status  New    Target Date  05/25/19            Plan - 05/11/19 0807    Clinical Impression Statement  Today's skilled session focused on strengthening, gait with rollator and balance reactions with only fatigue reported. Pt is not longer reporting hip pain, therefore did not utilize modalities this session. This is the pt's 1st return visit since recert, therefore she has 3 weeks left with the POC with cert end date of 09/10/3788. Will benefit from continued PT to  progress toward unmet goals.    Personal Factors and Comorbidities  Behavior Pattern;Fitness;Comorbidity 2;Time since onset of injury/illness/exacerbation;Transportation    Comorbidities  h/o breast cancer, DM, fibromyalgia, HTN, periperal neuropathy, and dyspnea; OA; chronic low back pain at a 7-8/10 after session.    Examination-Activity Limitations  Stairs;Stand;Squat;Locomotion Level;Transfers    Examination-Participation Restrictions  Meal Prep;Cleaning;Community Activity;Interpersonal Relationship;Shop    Stability/Clinical Decision Making  Evolving/Moderate complexity    Rehab Potential  Good    PT Frequency  1x / week   1 land visit/ 1 aquatic visit per week   PT Duration  4 weeks   will renew if pt makes progress   PT Treatment/Interventions  ADLs/Self Care Home Management;Aquatic Therapy;DME Instruction;Gait training;Stair training;Therapeutic activities;Therapeutic exercise;Balance training;Neuromuscular re-education;Patient/family education;Manual techniques;Moist Heat;Iontophoresis '4mg'$ /ml Dexamethasone;Ultrasound    PT Next Visit Plan 10th visit progress note next session; treat hip pain as indicated; continue to work on gait with rollator; continue to work on strenthening and balance reactions.    PT Home Exercise Plan  balance    Consulted and Agree with Plan of Care  Patient;Family member/caregiver    Family Member Consulted  daughter       Patient will benefit from skilled therapeutic intervention in order to improve the following deficits and impairments:  Abnormal gait, Decreased activity tolerance, Decreased balance, Decreased strength, Pain, Impaired sensation  Visit Diagnosis: Other abnormalities of gait and mobility  Muscle weakness (generalized)  Unsteadiness on feet     Problem List Patient Active Problem List   Diagnosis Date Noted  . Gait abnormality 01/09/2019  . Paresthesia 01/09/2019  . Near syncope   . Weakness   . Essential hypertension   .  Generalized weakness 12/13/2018  . Syncope 12/13/2018  . Peripheral neuropathy 06/08/2016  . Low back pain without sciatica 06/08/2016  . Abnormality of gait 06/08/2016  . Anemia due to antineoplastic chemotherapy 03/06/2016  . Hypersensitivity reaction 01/07/2016  . Port catheter in place 01/03/2016  . Malignant neoplasm of upper outer quadrant of female breast ([place]) 10/30/2015  . Primary osteoarthritis of right knee 02/28/2015  . Post-traumatic arthritis of ankle 05/10/2014  . MGUS (monoclonal gammopathy of unknown significance) 02/17/2012  . Diverticulosis of colon (without mention of hemorrhage) 01/21/2011  . Family history of malignant neoplasm of gastrointestinal  tract 01/21/2011  . Special screening for malignant neoplasms, colon 01/21/2011  . Other symptoms involving digestive system(787.99) 01/21/2011    Willow Ora, PTA, [facility] [address] [place], [place] [address] [phone] 05/11/19, 8:51 PM   Name: Julie [name] MRN: [id number] Date of Birth: 03-14-1933

## 2019-05-30 ENCOUNTER — Encounter: Payer: Self-pay | Admitting: Physical Therapy

## 2019-05-30 ENCOUNTER — Ambulatory Visit: Payer: Medicare Other | Attending: Neurology | Admitting: Physical Therapy

## 2019-05-30 ENCOUNTER — Other Ambulatory Visit: Payer: Self-pay

## 2019-05-30 DIAGNOSIS — R2681 Unsteadiness on feet: Secondary | ICD-10-CM | POA: Insufficient documentation

## 2019-05-30 DIAGNOSIS — R2689 Other abnormalities of gait and mobility: Secondary | ICD-10-CM | POA: Diagnosis not present

## 2019-05-30 DIAGNOSIS — M6281 Muscle weakness (generalized): Secondary | ICD-10-CM | POA: Insufficient documentation

## 2019-05-31 NOTE — Therapy (Signed)
[facility] [address] [place], [name], [address] Phone: [phone]   Fax:  [phone]   Physical Therapy Treatment & 10th visit progress note  Reporting period dates:   02-23-19 - 05-30-19 See below for progress towards goals  Patient Details  Name: Julie [name] MRN: [id number] Date of Birth: Sep 16, 1932 Referring Provider (PT): Dr. Marcial Pacas   Encounter Date: 05/30/2019  PT End of Session - 05/31/19 1325    Visit Number  10    Number of Visits  12    Date for PT Re-Evaluation  06/04/19    Authorization Type  Medicare/Aetna    Authorization Time Period  02-23-19 - 05-24-19; 04-27-19 - 06-25-19    PT Start Time  0715    PT Stop Time  0800    PT Time Calculation (min)  45 min    Activity Tolerance  Patient tolerated treatment well    Behavior During Therapy  [facility] for tasks assessed/performed       Past Medical History:  Diagnosis Date  . Anemia   . Anxiety    takes Klonopin at bedtime  . Arthritis    takes Methotrexate weekly and Prednisone  . Blood transfusion    no abnormal reaction noted  . Cancer (HCC)    breast   . Cataracts, bilateral    immature  . Complication of anesthesia    wakes up during surgery  . Depression   . Diabetes mellitus without complication ([place])   . Dyspnea    with exertion since chemo   . Fibromyalgia    takes Lyrica daily  . GERD (gastroesophageal reflux disease)    takes Nexium daily  . Glaucoma   . Hard of hearing   . Heart murmur   . Histoplasmosis   . History of colon polyps   . History of gout   . History of hiatal hernia   . History of shingles   . Hyperlipidemia   . Hypertension    takes Amlodipine,Metoprolol,and Losartan daily  . Incontinence of bowel   . Interstitial cystitis   . Joint pain   . Low back pain   . Nocturia   . Peripheral neuropathy   . Peripheral neuropathy   . Personal history of chemotherapy   . Personal history of radiation therapy    . PONV (postoperative nausea and vomiting)   . Ulcer 1970   duodenal  . Urinary frequency   . Urinary urgency   . Weakness    numbness in both hands and feet    Past Surgical History:  Procedure Laterality Date  . ABDOMINAL HYSTERECTOMY    . accupuncture  3 yrs ago  . ANKLE FUSION Left 05/10/2014   Procedure: LEFT ANKLE ARTHRODESIS ;  Surgeon: Wylene Simmer, MD;  Location: [facility];  Service: Orthopedics;  Laterality: Left;  . ANKLE SURGERY Left   . arm surgery Left    radius  . bladder tacked     . BREAST LUMPECTOMY Left   . CERVICAL FUSION    . CHOLECYSTECTOMY    . COLONOSCOPY    . CYSTOCELE REPAIR    . DILATION AND CURETTAGE OF UTERUS    . ESOPHAGOGASTRODUODENOSCOPY    . PORT A CATH REVISION N/A 12/27/2015   Procedure: PORT A CATH REVISION;  Surgeon: Rolm Bookbinder, MD;  Location: [facility];  Service: General;  Laterality: N/A;  . PORT-A-CATH REMOVAL N/A 03/25/2016   Procedure: REMOVAL PORT-A-CATH;  Surgeon: Rodman Key  [name], MD;  Location[facility];  Service: General;  Laterality: N/A;  . PORTACATH PLACEMENT Right 12/27/2015   Procedure: INSERTION PORT-A-CATH WITH ULTRASOUND ;  Surgeon: Rolm Bookbinder, MD;  Location: [facility];  Service: General;  Laterality: Right;  . RADIOACTIVE SEED GUIDED PARTIAL MASTECTOMY WITH AXILLARY SENTINEL LYMPH NODE BIOPSY Left 11/14/2015   Procedure: RADIOACTIVE SEED GUIDED LEFT BREAST LUMPECTOMY WITH AXILLARY SENTINEL LYMPH NODE BIOPSY;  Surgeon: Rolm Bookbinder, MD;  Location: [facility];  Service: General;  Laterality: Left;  RADIOACTIVE SEED GUIDED LEFT BREAST LUMPECTOMY WITH AXILLARY SENTINEL LYMPH NODE BIOPSY  . SALPINGOOPHORECTOMY Bilateral   . TOTAL KNEE ARTHROPLASTY Right 02/28/2015   Procedure: RIGHT TOTAL KNEE ARTHROPLASTY;  Surgeon: Rod Can, MD;  [facility];  Service: Orthopedics;  Laterality: Right;  . UPPER GASTROINTESTINAL ENDOSCOPY    . VAGINAL HYSTERECTOMY      There  were no vitals filed for this visit.  Subjective Assessment - 05/30/19 0719    Subjective  Pt reports no new problems or issues since previous PT session; states MD put her on low dose of prednisone for the hip bursitis and it is helping    Pertinent History  OA, h/o breast cancer, DM, fibromyalgia, HTN, peripheral neuropathy, dyspnea    Limitations  Standing;Walking;House hold activities    Diagnostic tests  MRI was done approx. 1 month ago    Patient Stated Goals  "I just want pain relief"    Currently in Pain?  No/denies                       [facility] Adult PT Treatment/Exercise - 05/31/19 0001      Transfers   Transfers  Sit to Stand    Sit to Stand  5: Supervision    Number of Reps  10 reps    Comments  no UE support used      Ambulation/Gait   Ambulation/Gait  Yes    Ambulation/Gait Assistance  5: Supervision    Ambulation Distance (Feet)  230 Feet    Assistive device  Rollator    Gait Pattern  Step-through pattern    Ambulation Surface  Level;Indoor      Knee/Hip Exercises: Standing   Heel Raises  Both;1 set;10 reps    Hip Flexion  Stengthening;Right;Left;10 reps;Knee straight;Knee bent;2 sets   1 set each - knee flexed/extended- 3# weight used   Hip Abduction  Stengthening;Right;Left;1 set;10 reps;Knee straight   3# weight   Hip Extension  Stengthening;Right;Left;1 set;10 reps;Knee straight   3# weight    Forward Step Up  Right;Both;1 set;5 reps;Hand Hold: 1;Step Height: 6"          Balance Exercises - 05/31/19 1324      Balance Exercises: Standing   Rockerboard  Anterior/posterior;EO;10 reps;UE support    Other Standing Exercises  stepping over and back of balance beam inside // bars 10 reps each leg with UE support prn          PT Short Term Goals - 05/31/19 1402      PT SHORT TERM GOAL #1   Title  same as LTG's        PT Long Term Goals - 05/31/19 1327      PT LONG TERM GOAL #1   Title  Improve Berg balance test score from 41/56  to >/= 46/56 to decrease fall risk. Upgraded LTG - incr. score from 45/56 to >/= 48/56 for reduced fall risk  Baseline  41/56 on 02-23-19; 45/56 on 03-23-19    Time  4    Period  Weeks    Status  Revised      PT LONG TERM GOAL #2   Title  Increase gait velocity from 1.69 ft/sec with SPC to >/= 2.1 ft/sec with SPC for incr. gait efficiency.    Baseline  15.66 secs = 2.09 ft/sec with SPC on 03-23-19    Time  4    Period  Weeks    Status  Achieved      PT LONG TERM GOAL #3   Title  Improve TUG score from 18.35 secs to </= 15.0 secs with SPC for reduced fall risk.  REVISED LTG= improve TUG score from 22.97 secs with cane to </= 17 secs with cane    Baseline  18.35 secs with SPC - 02-23-19;  22.97 secs with SPC -- 03-23-19    Time  4    Period  Weeks    Status  Revised      PT LONG TERM GOAL #4   Title  Pt will amb.300' with RW (or rollator) with supervision for incr. community accessibility.    Baseline  100' with [place] on 02-23-19;  350' with RW (03-23-19)    Time  4    Period  Weeks    Status  Achieved      PT LONG TERM GOAL #5   Title  Independent in HEP for balance and strengthening exs.    Baseline  pt reports she is walking in the home for exercise, not doing the specific exercises "because I'm forgetful"    Time  4    Period  Weeks    Status  Partially Met      PT LONG TERM GOAL #6   Title  Independent in aquatic HEP for participation upon D/C from PT.    Baseline  pt decided to hold on aquatic therapy at this time due to cold weather - 04-27-19    Time  4    Period  Weeks    Status  Deferred      PT LONG TERM GOAL #7   Title  Increase gait velocity from 2.1 ft/sec with SPC to >/= 2.5 ft/sec with SPC for incr. gait efficiency.    Baseline  2.1 ft/sec with SPC on 03-23-19    Time  4    Period  Weeks    Status  On-going      PT LONG TERM GOAL #8   Title  Pt will report at least 50% improvement in Rt hip pain over Rt greater trochanter with palpation to indicate some  resolution of bursitis.    Baseline  mod. to severe tenderness reported    Time  4    Period  Weeks    Status  New            Plan - 05/31/19 1326    Clinical Impression Statement  This 10th visit progress note covers dates 02-23-20 - 05-30-19;  pt is now using rollator for assistance with ambulation rather than [facility] for increased safety and stability.  Pt's Berg score = 45/56.  Pt was scheduled to receive aquatic therapy but she has declined at this time due to the cold weather - pt states she may opt to participate in this program in the spring/summer.  Pt reports improvement in Rt hip bursitis pain.  Pt is scheduled for D/C next session.    PT Frequency  1x / week    PT Duration  4 weeks    PT Treatment/Interventions  ADLs/Self Care Home Management;Aquatic Therapy;DME Instruction;Gait training;Stair training;Therapeutic activities;Therapeutic exercise;Balance training;Neuromuscular re-education;Patient/family education;Manual techniques;Moist Heat;Iontophoresis '4mg'$ /ml Dexamethasone;Ultrasound    PT Next Visit Plan  check LTG's and D/C    PT Home Exercise Plan  balance    Consulted and Agree with Plan of Care  Patient    Family Member Consulted  daughter       Patient will benefit from skilled therapeutic intervention in order to improve the following deficits and impairments:     Visit Diagnosis: Other abnormalities of gait and mobility  Muscle weakness (generalized)  Unsteadiness on feet     Problem List Patient Active Problem List   Diagnosis Date Noted  . Gait abnormality 01/09/2019  . Paresthesia 01/09/2019  . Near syncope   . Weakness   . Essential hypertension   . Generalized weakness 12/13/2018  . Syncope 12/13/2018  . Peripheral neuropathy 06/08/2016  . Low back pain without sciatica 06/08/2016  . Abnormality of gait 06/08/2016  . Anemia due to antineoplastic chemotherapy 03/06/2016  . Hypersensitivity reaction 01/07/2016  . Port catheter in place 01/03/2016   . Malignant neoplasm of upper outer quadrant of female breast ([place]) 10/30/2015  . Primary osteoarthritis of right knee 02/28/2015  . Post-traumatic arthritis of ankle 05/10/2014  . MGUS (monoclonal gammopathy of unknown significance) 02/17/2012  . Diverticulosis of colon (without mention of hemorrhage) 01/21/2011  . Family history of malignant neoplasm of gastrointestinal tract 01/21/2011  . Special screening for malignant neoplasms, colon 01/21/2011  . Other symptoms involving digestive system(787.99) 01/21/2011    Faige Seely, [place], PT 05/31/2019, 2:04 PM  [facility] [address] [place], [name], [address] Phone: [phone]   Fax:  [phone]  Name: Julie [name] MRN: [id number] Date of Birth: December 02, 1932

## 2019-06-05 ENCOUNTER — Telehealth: Payer: Self-pay | Admitting: Hematology

## 2019-06-05 NOTE — Telephone Encounter (Signed)
Called patient per providers request, patient would prefer to come in on 01/13.

## 2019-06-06 ENCOUNTER — Telehealth: Payer: Self-pay | Admitting: Hematology

## 2019-06-06 ENCOUNTER — Ambulatory Visit: Payer: Medicare Other | Admitting: Physical Therapy

## 2019-06-06 ENCOUNTER — Other Ambulatory Visit: Payer: Self-pay

## 2019-06-06 DIAGNOSIS — R2689 Other abnormalities of gait and mobility: Secondary | ICD-10-CM | POA: Diagnosis not present

## 2019-06-06 DIAGNOSIS — M6281 Muscle weakness (generalized): Secondary | ICD-10-CM

## 2019-06-06 DIAGNOSIS — R2681 Unsteadiness on feet: Secondary | ICD-10-CM

## 2019-06-06 NOTE — Telephone Encounter (Signed)
Returned patient's phone call regarding rescheduling 01/13 appointment, per patient's request appointment has moved to 01/21.

## 2019-06-07 ENCOUNTER — Other Ambulatory Visit: Payer: Medicare Other

## 2019-06-07 ENCOUNTER — Ambulatory Visit: Payer: Medicare Other | Admitting: Hematology

## 2019-06-07 ENCOUNTER — Encounter: Payer: Self-pay | Admitting: Physical Therapy

## 2019-06-07 NOTE — Therapy (Signed)
[facility] [address] [place], [name], [address] Phone: [phone]   Fax:  [phone]  Physical Therapy Treatment  Patient Details  Name: Julie [name] MRN: [id number] Date of Birth: Oct 03, 1932 Referring Provider (PT): Dr. Marcial Pacas   Encounter Date: 06/06/2019  PT End of Session - 06/07/19 2200    Visit Number  11    Number of Visits  12    Date for PT Re-Evaluation  06/04/19    Authorization Type  Medicare/Aetna    Authorization Time Period  02-23-19 - 05-24-19; 04-27-19 - 06-25-19    PT Start Time  0715    PT Stop Time  0758    PT Time Calculation (min)  43 min    Activity Tolerance  Patient tolerated treatment well    Behavior During Therapy  [facility] for tasks assessed/performed       Past Medical History:  Diagnosis Date  . Anemia   . Anxiety    takes Klonopin at bedtime  . Arthritis    takes Methotrexate weekly and Prednisone  . Blood transfusion    no abnormal reaction noted  . Cancer (HCC)    breast   . Cataracts, bilateral    immature  . Complication of anesthesia    wakes up during surgery  . Depression   . Diabetes mellitus without complication ([place])   . Dyspnea    with exertion since chemo   . Fibromyalgia    takes Lyrica daily  . GERD (gastroesophageal reflux disease)    takes Nexium daily  . Glaucoma   . Hard of hearing   . Heart murmur   . Histoplasmosis   . History of colon polyps   . History of gout   . History of hiatal hernia   . History of shingles   . Hyperlipidemia   . Hypertension    takes Amlodipine,Metoprolol,and Losartan daily  . Incontinence of bowel   . Interstitial cystitis   . Joint pain   . Low back pain   . Nocturia   . Peripheral neuropathy   . Peripheral neuropathy   . Personal history of chemotherapy   . Personal history of radiation therapy   . PONV (postoperative nausea and vomiting)   . Ulcer 1970   duodenal  . Urinary frequency   . Urinary  urgency   . Weakness    numbness in both hands and feet    Past Surgical History:  Procedure Laterality Date  . ABDOMINAL HYSTERECTOMY    . accupuncture  3 yrs ago  . ANKLE FUSION Left 05/10/2014   Procedure: LEFT ANKLE ARTHRODESIS ;  Surgeon: Wylene Simmer, MD;  Location: [facility];  Service: Orthopedics;  Laterality: Left;  . ANKLE SURGERY Left   . arm surgery Left    radius  . bladder tacked     . BREAST LUMPECTOMY Left   . CERVICAL FUSION    . CHOLECYSTECTOMY    . COLONOSCOPY    . CYSTOCELE REPAIR    . DILATION AND CURETTAGE OF UTERUS    . ESOPHAGOGASTRODUODENOSCOPY    . PORT A CATH REVISION N/A 12/27/2015   Procedure: PORT A CATH REVISION;  Surgeon: Rolm Bookbinder, MD;  Location: [facility];  Service: General;  Laterality: N/A;  . PORT-A-CATH REMOVAL N/A 03/25/2016   Procedure: REMOVAL PORT-A-CATH;  Surgeon: Rolm Bookbinder, MD;  [facility];  Service: General;  Laterality: N/A;  . PORTACATH PLACEMENT Right 12/27/2015   Procedure:  INSERTION PORT-A-CATH WITH ULTRASOUND ;  Surgeon: Rolm Bookbinder, MD;  Location: [facility];  Service: General;  Laterality: Right;  . RADIOACTIVE SEED GUIDED PARTIAL MASTECTOMY WITH AXILLARY SENTINEL LYMPH NODE BIOPSY Left 11/14/2015   Procedure: RADIOACTIVE SEED GUIDED LEFT BREAST LUMPECTOMY WITH AXILLARY SENTINEL LYMPH NODE BIOPSY;  Surgeon: Rolm Bookbinder, MD;  Location: [facility];  Service: General;  Laterality: Left;  RADIOACTIVE SEED GUIDED LEFT BREAST LUMPECTOMY WITH AXILLARY SENTINEL LYMPH NODE BIOPSY  . SALPINGOOPHORECTOMY Bilateral   . TOTAL KNEE ARTHROPLASTY Right 02/28/2015   Procedure: RIGHT TOTAL KNEE ARTHROPLASTY;  Surgeon: Rod Can, MD;  [facility];  Service: Orthopedics;  Laterality: Right;  . UPPER GASTROINTESTINAL ENDOSCOPY    . VAGINAL HYSTERECTOMY      There were no vitals filed for this visit.                    [facility] Adult PT Treatment/Exercise -  06/07/19 0001      Transfers   Transfers  Sit to Stand    Number of Reps  Other reps (comment)   2   Comments  min UE support from mat      Ambulation/Gait   Ambulation/Gait  Yes    Ambulation/Gait Assistance  5: Supervision    Ambulation Distance (Feet)  230 Feet    Assistive device  Rolling walker    Ambulation Surface  Level;Indoor    Gait velocity  13.25 secs = 2.48 ft/sec with rollator      Standardized Balance Assessment   Standardized Balance Assessment  Berg Balance Test      Berg Balance Test   Sit to Stand  Able to stand without using hands and stabilize independently    Standing Unsupported  Able to stand safely 2 minutes    Sitting with Back Unsupported but Feet Supported on Floor or Stool  Able to sit safely and securely 2 minutes    Stand to Sit  Sits safely with minimal use of hands    Transfers  Able to transfer safely, minor use of hands    Standing Unsupported with Eyes Closed  Able to stand 10 seconds safely    Standing Ubsupported with Feet Together  Able to place feet together independently and stand for 1 minute with supervision    From Standing, Reach Forward with Outstretched Arm  Can reach confidently >25 cm (10")    From Standing Position, Pick up Object from Floor  Able to pick up shoe, needs supervision    From Standing Position, Turn to Look Behind Over each Shoulder  Looks behind from both sides and weight shifts well    Turn 360 Degrees  Able to turn 360 degrees safely but slowly    Standing Unsupported, Alternately Place Feet on Step/Stool  Able to complete >2 steps/needs minimal assist    Standing Unsupported, One Foot in Front  Able to plae foot ahead of the other independently and hold 30 seconds    Standing on One Leg  Tries to lift leg/unable to hold 3 seconds but remains standing independently    Total Score  45      Timed Up and Go Test   TUG  Normal TUG    Normal TUG (seconds)  12.44   with rollator     Knee/Hip Exercises: Aerobic    Recumbent Bike  SciFit level 2.0 x 10 minutes with LE's only  PT Education - 06/07/19 2159    Education Details  reviewed progress and LTG's with pt    Person(s) Educated  Patient    Methods  Explanation    Comprehension  Verbalized understanding       PT Short Term Goals - 06/07/19 2153      PT SHORT TERM GOAL #1   Title  same as LTG's        PT Long Term Goals - 06/06/19 0717      PT LONG TERM GOAL #1   Title  Improve Berg balance test score from 41/56 to >/= 46/56 to decrease fall risk. Upgraded LTG - incr. score from 45/56 to >/= 48/56 for reduced fall risk    Baseline  41/56 on 02-23-19; 45/56 on 03-23-19    Time  4    Period  Weeks    Status  Revised      PT LONG TERM GOAL #2   Title  Increase gait velocity from 1.69 ft/sec with SPC to >/= 2.1 ft/sec with SPC for incr. gait efficiency.    Baseline  15.66 secs = 2.09 ft/sec with SPC on 03-23-19    Time  4    Period  Weeks    Status  Achieved      PT LONG TERM GOAL #3   Title  Improve TUG score from 18.35 secs to </= 15.0 secs with SPC for reduced fall risk.  REVISED LTG= improve TUG score from 22.97 secs with cane to </= 17 secs with cane    Baseline  18.35 secs with SPC - 02-23-19;  22.97 secs with SPC -- 03-23-19    Time  4    Period  Weeks    Status  Revised      PT LONG TERM GOAL #4   Title  Pt will amb.300' with RW (or rollator) with supervision for incr. community accessibility.    Baseline  100' with [place] on 02-23-19;  350' with RW (03-23-19)    Time  4    Period  Weeks    Status  Achieved      PT LONG TERM GOAL #5   Title  Independent in HEP for balance and strengthening exs.    Baseline  pt reports she is walking in the home for exercise, not doing the specific exercises "because I'm forgetful"    Time  4    Period  Weeks    Status  Partially Met      PT LONG TERM GOAL #6   Title  Independent in aquatic HEP for participation upon D/C from PT.    Baseline  pt decided to hold on  aquatic therapy at this time due to cold weather - 04-27-19    Time  4    Period  Weeks    Status  Deferred      PT LONG TERM GOAL #7   Title  Increase gait velocity from 2.1 ft/sec with SPC to >/= 2.5 ft/sec with SPC for incr. gait efficiency.    Baseline  2.1 ft/sec with SPC on 03-23-19    Time  4    Period  Weeks    Status  On-going      PT LONG TERM GOAL #8   Title  Pt will report at least 50% improvement in Rt hip pain over Rt greater trochanter with palpation to indicate some resolution of bursitis.    Baseline  mod. to severe tenderness reported    Time  4    Period  Weeks    Status  New            Plan - 06/07/19 2201    Clinical Impression Statement  Pt has met LTG's and is now using rollator for more stability and for reduced fall risk with ambulation.  Pt has completed OP PT program with no further needs identified at this time.  Pt reports improvement in Rt hip bursitis with use of presribed prednisone from MD.    PT Frequency  1x / week    PT Duration  4 weeks    PT Treatment/Interventions  ADLs/Self Care Home Management;Aquatic Therapy;DME Instruction;Gait training;Stair training;Therapeutic activities;Therapeutic exercise;Balance training;Neuromuscular re-education;Patient/family education;Manual techniques;Moist Heat;Iontophoresis '4mg'$ /ml Dexamethasone;Ultrasound    PT Next Visit Plan  N/A - D/C on 06-06-19    PT Home Exercise Plan  balance    Consulted and Agree with Plan of Care  Patient    Family Member Consulted  daughter       Patient will benefit from skilled therapeutic intervention in order to improve the following deficits and impairments:     Visit Diagnosis: Other abnormalities of gait and mobility  Muscle weakness (generalized)  Unsteadiness on feet     Problem List Patient Active Problem List   Diagnosis Date Noted  . Gait abnormality 01/09/2019  . Paresthesia 01/09/2019  . Near syncope   . Weakness   . Essential hypertension   .  Generalized weakness 12/13/2018  . Syncope 12/13/2018  . Peripheral neuropathy 06/08/2016  . Low back pain without sciatica 06/08/2016  . Abnormality of gait 06/08/2016  . Anemia due to antineoplastic chemotherapy 03/06/2016  . Hypersensitivity reaction 01/07/2016  . Port catheter in place 01/03/2016  . Malignant neoplasm of upper outer quadrant of female breast ([place]) 10/30/2015  . Primary osteoarthritis of right knee 02/28/2015  . Post-traumatic arthritis of ankle 05/10/2014  . MGUS (monoclonal gammopathy of unknown significance) 02/17/2012  . Diverticulosis of colon (without mention of hemorrhage) 01/21/2011  . Family history of malignant neoplasm of gastrointestinal tract 01/21/2011  . Special screening for malignant neoplasms, colon 01/21/2011  . Other symptoms involving digestive system(787.99) 01/21/2011    PHYSICAL THERAPY DISCHARGE SUMMARY  Visits from Start of Care: 11  Current functional level related to goals / functional outcomes: See above for progress towards goals   Remaining deficits: Continued decreased standing balance (esp. High level balance skills) Cont. Dependency with gait with pt using rollator for assistance with community ambulation   Education / Equipment: Pt has been instructed in HEP for balance and strengthening exs.; pt declines aquatic therapy at this time due to the cold weather - pt States she may want to participate in this program in spring/summer Pt has obtained rollator for assistance with amb. (no longer using SPC for assist. With community amb.) Plan: Patient agrees to discharge.  Patient goals were not met. Patient is being discharged due to meeting the stated rehab goals.  ?????        Alda Lea, PT 06/07/2019, 10:07 PM  [facility] [address] [place], [name], [address] Phone: [phone]   Fax:  [phone]  Name: Julie [name] MRN: [id number] Date  of Birth: 01-Oct-1932

## 2019-06-09 NOTE — Progress Notes (Signed)
Julie [name]   [phone] Fax:(336) [phone]   Clinic Follow up Note   Patient Care Team: [name], DO as PCP - General (Family Medicine) Valinda Party, MD as Referring Physician (Rheumatology) Rolm Bookbinder, MD as Consulting Physician (General Surgery) Truitt Merle, MD as Consulting Physician (Hematology) Delice Bison Charlestine Massed, NP as Nurse Practitioner (Hematology and Oncology)  Date of Service:  06/15/2019  CHIEF COMPLAINT: F/u of left breast cancer   SUMMARY OF ONCOLOGIC HISTORY: Oncology History Overview Note  Malignant neoplasm of upper outer quadrant of female breast [facility])   Staging form: Breast, AJCC 7th Edition     Pathologic stage from 11/14/2015: Stage IA (T1c, N0, cM0) - Signed by Truitt Merle, MD on 11/25/2015     Clinical stage from 11/20/2015: Stage IA (T1c, N0, M0) - Signed by Curt Bears, MD on 11/20/2015     Malignant neoplasm of upper outer quadrant of female breast ([place])  10/09/2015 Mammogram   Screening bilateral mammograms showed a possible left breast mass. Diagnostic mammogram and ultrasound showed a 7 x 7 x 7 mm mass in the left breast at 11:00 position.   10/22/2015 Receptors her2   ER negative, PR negative, HER-2 not amplified   10/22/2015 Initial Biopsy   Left breast needle core biopsy at the 1:00 position showed invasive ductal carcinoma.   10/22/2015 Initial Diagnosis   Malignant neoplasm of upper outer quadrant of female breast ([place])   11/14/2015 Surgery   Left breast lumpectomy and sentinel lymph node biopsy [name])   11/14/2015 Pathology Results   Left breast lumpectomy showed invasive ductal carcinoma, grade 3, 1.1 cm, resection margins were negative, sentinel lymph node biopsy showed benign breast parenchyma, lymph node tissue not identified. Repeated ER PR and HER-2 were all negative.   12/20/2015 - 03/13/2016 Chemotherapy   Weekly Taxol 80 mg/m x 2 cycles, then changed to Abraxane 100 mg/m for cycles  3-12 due to infusion reaction. Abraxane dose-reduced cycle #9, then again for cycles #11 & #12 due to neuropathy. [total 12 cycles chemo]    04/01/2016 - 05/11/2016 Radiation Therapy   Adjuvant breast radiation ([place]). Left breast: 50.4 Gy in 28 fractions   06/19/2016 Imaging   MR Lumbar Spine without contrast  IMPRESSION: 1. Diffuse lumbar spondylosis with discogenic and facet degenerative changes progressed from 2010 lumbar MRI. 2. Multilevel canal stenosis is mild-to-moderate at L2-3, moderate L3-4, and mild at L4-5. 3. Multiple levels of mild and moderate neural foraminal narrowing with severe foraminal narrowing at the right L3-4 and L4-5 levels.   11/12/2017 Imaging   11/12/2017 DEXA ASSESSMENT: The BMD measured at Forearm Radius 33% is 0.648 g/cm2 with a T-score of -2.7. This patient is considered osteoporotic according to [facility]) criteria.   11/12/2017 Mammogram   11/12/2017 Diagnostic Mammogram IMPRESSION: No mammographic evidence of malignancy in either breast, status post left lumpectomy.      CURRENT THERAPY:  Surveillance  INTERVAL HISTORY:  Julie [name] is here for a follow up of left breast cancer. She was last seen by  Me 6 months ago. She presents to the clinic alone.  She is clinically stable.  She still has moderate back pain and arthralgia from her rheumatoid arthritis, for which she is on prednisone.  Her neuropathy on feet is stable, she is on Neurontin. No recent fall.  She is able to do her ADLs and functions well at home. No new pian, or other complaints.  Review of system otherwise negative.  MEDICAL HISTORY:  Past Medical History:  Diagnosis Date  . Anemia   . Anxiety    takes Klonopin at bedtime  . Arthritis    takes Methotrexate weekly and Prednisone  . Blood transfusion    no abnormal reaction noted  . Cancer (HCC)    breast   . Cataracts, bilateral    immature  . Complication of anesthesia    wakes up during  surgery  . Depression   . Diabetes mellitus without complication ([place])   . Dyspnea    with exertion since chemo   . Fibromyalgia    takes Lyrica daily  . GERD (gastroesophageal reflux disease)    takes Nexium daily  . Glaucoma   . Hard of hearing   . Heart murmur   . Histoplasmosis   . History of colon polyps   . History of gout   . History of hiatal hernia   . History of shingles   . Hyperlipidemia   . Hypertension    takes Amlodipine,Metoprolol,and Losartan daily  . Incontinence of bowel   . Interstitial cystitis   . Joint pain   . Low back pain   . Nocturia   . Peripheral neuropathy   . Peripheral neuropathy   . Personal history of chemotherapy   . Personal history of radiation therapy   . PONV (postoperative nausea and vomiting)   . Ulcer 1970   duodenal  . Urinary frequency   . Urinary urgency   . Weakness    numbness in both hands and feet    SURGICAL HISTORY: Past Surgical History:  Procedure Laterality Date  . ABDOMINAL HYSTERECTOMY    . accupuncture  3 yrs ago  . ANKLE FUSION Left 05/10/2014   Procedure: LEFT ANKLE ARTHRODESIS ;  Surgeon: Wylene Simmer, MD;  Location: [place];  Service: Orthopedics;  Laterality: Left;  . ANKLE SURGERY Left   . arm surgery Left    radius  . bladder tacked     . BREAST LUMPECTOMY Left   . CERVICAL FUSION    . CHOLECYSTECTOMY    . COLONOSCOPY    . CYSTOCELE REPAIR    . DILATION AND CURETTAGE OF UTERUS    . ESOPHAGOGASTRODUODENOSCOPY    . PORT A CATH REVISION N/A 12/27/2015   Procedure: PORT A CATH REVISION;  Surgeon: Rolm Bookbinder, MD;  Location: [place];  Service: General;  Laterality: N/A;  . PORT-A-CATH REMOVAL N/A 03/25/2016   Procedure: REMOVAL PORT-A-CATH;  Surgeon: Rolm Bookbinder, MD;  Location[facility];  Service: General;  Laterality: N/A;  . PORTACATH PLACEMENT Right 12/27/2015   Procedure: INSERTION PORT-A-CATH WITH ULTRASOUND ;  Surgeon: Rolm Bookbinder, MD;  Location: [place];  Service: General;  Laterality: Right;  . RADIOACTIVE SEED GUIDED PARTIAL MASTECTOMY WITH AXILLARY SENTINEL LYMPH NODE BIOPSY Left 11/14/2015   Procedure: RADIOACTIVE SEED GUIDED LEFT BREAST LUMPECTOMY WITH AXILLARY SENTINEL LYMPH NODE BIOPSY;  Surgeon: Rolm Bookbinder, MD;  Location: [place];  Service: General;  Laterality: Left;  RADIOACTIVE SEED GUIDED LEFT BREAST LUMPECTOMY WITH AXILLARY SENTINEL LYMPH NODE BIOPSY  . SALPINGOOPHORECTOMY Bilateral   . TOTAL KNEE ARTHROPLASTY Right 02/28/2015   Procedure: RIGHT TOTAL KNEE ARTHROPLASTY;  Surgeon: Rod Can, MD;  [facility];  Service: Orthopedics;  Laterality: Right;  . UPPER GASTROINTESTINAL ENDOSCOPY    . VAGINAL HYSTERECTOMY      I have reviewed the social history and family history with the patient and they are unchanged from  previous note.  ALLERGIES:  is allergic to codeine; other; and aspirin.  MEDICATIONS:  Current Outpatient Medications  Medication Sig Dispense Refill  . amLODipine (NORVASC) 10 MG tablet Take 10 mg by mouth every morning.     . folic acid (FOLVITE) 1 MG tablet Take 2 mg by mouth every morning.     [name] FREESTYLE LITE test strip TEST ONCE EVERY DAY  4  . hydroxychloroquine (PLAQUENIL) 200 MG tablet Take 200 mg by mouth daily.    [name] lovastatin (MEVACOR) 40 MG tablet 1 (ONE) TABLET TAKE IN THE EVENING AFTER DINNER (Patient taking differently: Take 40 mg by mouth every evening. ) 90 tablet 1  . metoprolol succinate (TOPROL-XL) 50 MG 24 hr tablet Take 50 mg by mouth every evening.   2  . predniSONE (DELTASONE) 20 MG tablet Take 1 tablet (20 mg total) by mouth 2 (two) times daily. 10 tablet 0  . pregabalin (LYRICA) 75 MG capsule Take 75 mg by mouth 2 (two) times daily.    . pregabalin (LYRICA) 25 MG capsule Take 1 capsule (25 mg total) by mouth 3 (three) times daily. (Patient not taking: Reported on 03/01/2019) 90 capsule 0   No current facility-administered medications for this visit.     PHYSICAL EXAMINATION: ECOG PERFORMANCE STATUS: 1 - Symptomatic but completely ambulatory  Vitals:   06/15/19 0806  BP: (!) 162/80  Pulse: 64  Resp: 17  Temp: 98.3 F (36.8 C)  SpO2: 95%   Filed Weights   06/15/19 0806  Weight: 215 lb 11.2 oz (97.8 kg)   GENERAL:alert, no distress and comfortable SKIN: skin color, texture, turgor are normal, no rashes or significant lesions EYES: normal, Conjunctiva are pink and non-injected, sclera clear NECK: supple, thyroid normal size, non-tender, without nodularity LYMPH:  no palpable lymphadenopathy in the cervical, axillary  LUNGS: clear to auscultation and percussion with normal breathing effort HEART: regular rate & rhythm and no murmurs and no lower extremity edema ABDOMEN:abdomen soft, non-tender and normal bowel sounds Musculoskeletal:no cyanosis of digits and no clubbing  NEURO: alert & oriented x 3 with fluent speech, no focal motor/sensory deficits S/p left lumpectomy: Surgical incisions healed well with palpable 1cm nodule at UOQ at 2:00 position of left breast 2-3 cm from nipple (+) Skin hyperpigmentation from RT. No palpable abnormality in right breast or bilateral axilla  LABORATORY DATA:  I have reviewed the data as listed CBC Latest Ref Rng & Units 06/15/2019 12/14/2018 12/13/2018  WBC 4.0 - 10.5 K/uL 5.7 5.1 5.6  Hemoglobin 12.0 - 15.0 g/dL 12.1 11.4(L) 12.9  Hematocrit 36.0 - 46.0 % 38.3 36.5 40.2  Platelets 150 - 400 K/uL 235 194 214     CMP Latest Ref Rng & Units 06/15/2019 01/09/2019 12/14/2018  Glucose 70 - 99 mg/dL 93 - 98  BUN 8 - 23 mg/dL 26(H) - 17  Creatinine 0.44 - 1.00 mg/dL 0.97 - 0.78  Sodium 135 - 145 mmol/L 143 - 141  Potassium 3.5 - 5.1 mmol/L 3.7 - 3.7  Chloride 98 - 111 mmol/L 105 - 108  CO2 22 - 32 mmol/L 28 - 24  Calcium 8.9 - 10.3 mg/dL 8.9 - 8.8(L)  Total Protein 6.5 - 8.1 g/dL 7.3 7.1 6.6  Total Bilirubin 0.3 - 1.2 mg/dL 0.6 - 0.8  Alkaline Phos 38 - 126 U/L 86 - 66  AST 15 - 41 U/L 19 -  23  ALT 0 - 44 U/L 11 - 15      RADIOGRAPHIC STUDIES: I  have personally reviewed the radiological images as listed and agreed with the findings in the report. No results found.   ASSESSMENT & PLAN:  Julie [name] is a 86 y.o. female with    1. Malignant or new present of upper outer quadrant of left female breast, invasive ductal carcinoma, grade 3, pT1cN0M0, stage IA, ER-/PR-/HER2- -She was diagnosed in 09/2015. She is s/p left breast lumpectomy, adjuvant Taxol/Abraxane and adjuvant radiation. -She is clinically doing well. Lab reviewed, her CBC and CMP are within normal limits. Her physical exam and her 10/2018 mammogram show palpable fat necrosis at incision site of left breast, which is stable, will monitor in the future. There is no clinical concern for recurrence. -It has been 3 years since her diagnosis. At this point her risk of recurrence has decreased.  -Continue Surveillance. Next Mammogram in 10/2019 -F/u in 6 months with lab    2. MGUS  -10/2019labs showher M-protein and light chain levels have been overall stable  -No clinical concern for disease progression. will continue follow up SPEP/IFE, and light chain levels every 3-6 months. -MM panel still pending today (12/05/18). M-protein has been stable at 0.8 for the past 2 years.   3.Joint and muscular pain, Rheumatoid arthritis,lumbar spondylosis -she will continue follow up with rheumatologist Dr. Dossie Der. -She has chronic low back pain from lumber spondylosis -Recently started on Prednisone treatment by RA. Pain has much improved, controlled.she is on prednisone  -Her chronic back pain continues to progress over the years. She has been seen by orthopedic surgeon about her spinal stenosis, but surgery was not recommended.   4. Anemia, intermittent -resolved today,we'll continue monitoring. -On Folic Acid  5.Peripheral neuropathy, G1-2 -No significant impact on her function, we'll continue to monitor  closely. She understands that recovery from neuropathy is a slow process. -She has also triedacupuncture which helped. Continuetaking Lyricaand Vitamin B12. -overall stable.Ambulates with walker  6. HTN -Continueto Follow with Dr. Maudie Mercury on this.  -On amlodipine, valsartan-HCTZ, metoprolol   7. Short Term Memory loss -Ptpreviouslysharedconcern for memory loss but she deniedfurther medication to help. She previously declined clinical trial option. Practicingmental games and read and socialize to helpwas encouraged.  8. Osteoporosis -10/2017 DEXA shows osteoporosis with lowest T-score of -2.7 at Forearm radius. -She is athigh fracture risk due to her frequent falls. We previously discussed oral and injectablebisphosphonate. She think about it and discuss with PCP andrheumatologist.  I discussed steroid will make osteoporosis worse, and I encouraged her to consider oral biphosphonate or Prolia injection.  -Her PCP has stopped her calcium and vitamin D.     Plan -She is clinically doing well  -Lab and f/u in 6 months  -mammogram in 10/2019 -I spoke with her daughter during her visit    No problem-specific Assessment & Plan notes found for this encounter.   Orders Placed This Encounter  Procedures  . MM DIAG BREAST TOMO BILATERAL    Standing Status:   Future    Standing Expiration Date:   06/14/2020    Order Specific Question:   Reason for Exam (SYMPTOM  OR DIAGNOSIS REQUIRED)    Answer:   screening    Order Specific Question:   Preferred imaging location?    Answer:   [facility]   All questions were answered. The patient knows to call the clinic with any problems, questions or concerns. No barriers to learning was detected. The total time spent in the appointment was 30 minutes.     Truitt Merle, MD  06/15/2019   I, Joslyn Devon, am acting as scribe for Truitt Merle, MD.   I have reviewed the above documentation for accuracy and completeness, and I agree with  the above.

## 2019-06-13 DIAGNOSIS — M545 Low back pain: Secondary | ICD-10-CM | POA: Diagnosis not present

## 2019-06-13 DIAGNOSIS — M797 Fibromyalgia: Secondary | ICD-10-CM | POA: Diagnosis not present

## 2019-06-13 DIAGNOSIS — I00 Rheumatic fever without heart involvement: Secondary | ICD-10-CM | POA: Diagnosis not present

## 2019-06-13 DIAGNOSIS — Z79899 Other long term (current) drug therapy: Secondary | ICD-10-CM | POA: Diagnosis not present

## 2019-06-15 ENCOUNTER — Other Ambulatory Visit: Payer: Self-pay

## 2019-06-15 ENCOUNTER — Encounter: Payer: Self-pay | Admitting: Hematology

## 2019-06-15 ENCOUNTER — Inpatient Hospital Stay (HOSPITAL_BASED_OUTPATIENT_CLINIC_OR_DEPARTMENT_OTHER): Payer: Medicare Other | Admitting: Hematology

## 2019-06-15 ENCOUNTER — Inpatient Hospital Stay: Payer: Medicare Other | Attending: Hematology

## 2019-06-15 VITALS — BP 162/80 | HR 64 | Temp 98.3°F | Resp 17 | Ht 64.0 in | Wt 215.7 lb

## 2019-06-15 DIAGNOSIS — G629 Polyneuropathy, unspecified: Secondary | ICD-10-CM | POA: Diagnosis not present

## 2019-06-15 DIAGNOSIS — D472 Monoclonal gammopathy: Secondary | ICD-10-CM | POA: Diagnosis not present

## 2019-06-15 DIAGNOSIS — I10 Essential (primary) hypertension: Secondary | ICD-10-CM | POA: Insufficient documentation

## 2019-06-15 DIAGNOSIS — Z853 Personal history of malignant neoplasm of breast: Secondary | ICD-10-CM | POA: Diagnosis not present

## 2019-06-15 DIAGNOSIS — M810 Age-related osteoporosis without current pathological fracture: Secondary | ICD-10-CM | POA: Insufficient documentation

## 2019-06-15 DIAGNOSIS — M069 Rheumatoid arthritis, unspecified: Secondary | ICD-10-CM | POA: Diagnosis not present

## 2019-06-15 DIAGNOSIS — M47816 Spondylosis without myelopathy or radiculopathy, lumbar region: Secondary | ICD-10-CM | POA: Insufficient documentation

## 2019-06-15 DIAGNOSIS — C50411 Malignant neoplasm of upper-outer quadrant of right female breast: Secondary | ICD-10-CM | POA: Diagnosis not present

## 2019-06-15 DIAGNOSIS — Z171 Estrogen receptor negative status [ER-]: Secondary | ICD-10-CM

## 2019-06-15 DIAGNOSIS — C50412 Malignant neoplasm of upper-outer quadrant of left female breast: Secondary | ICD-10-CM

## 2019-06-15 LAB — COMPREHENSIVE METABOLIC PANEL
ALT: 11 U/L (ref 0–44)
AST: 19 U/L (ref 15–41)
Albumin: 3.6 g/dL (ref 3.5–5.0)
Alkaline Phosphatase: 86 U/L (ref 38–126)
Anion gap: 10 (ref 5–15)
BUN: 26 mg/dL — ABNORMAL HIGH (ref 8–23)
CO2: 28 mmol/L (ref 22–32)
Calcium: 8.9 mg/dL (ref 8.9–10.3)
Chloride: 105 mmol/L (ref 98–111)
Creatinine, Ser: 0.97 mg/dL (ref 0.44–1.00)
GFR calc Af Amer: >60 mL/min (ref >60)
GFR calc non Af Amer: 54 mL/min — ABNORMAL LOW (ref >60)
Glucose, Bld: 93 mg/dL (ref 70–99)
Potassium: 3.7 mmol/L (ref 3.5–5.1)
Sodium: 143 mmol/L (ref 135–145)
Total Bilirubin: 0.6 mg/dL (ref 0.3–1.2)
Total Protein: 7.3 g/dL (ref 6.5–8.1)

## 2019-06-15 LAB — CBC WITH DIFFERENTIAL/PLATELET
Abs Immature Granulocytes: 0.02 10*3/uL (ref 0.00–0.07)
Basophils Absolute: 0.1 10*3/uL (ref 0.0–0.1)
Basophils Relative: 1 %
Eosinophils Absolute: 0.1 10*3/uL (ref 0.0–0.5)
Eosinophils Relative: 2 %
HCT: 38.3 % (ref 36.0–46.0)
Hemoglobin: 12.1 g/dL (ref 12.0–15.0)
Immature Granulocytes: 0 %
Lymphocytes Relative: 20 %
Lymphs Abs: 1.1 10*3/uL (ref 0.7–4.0)
MCH: 27.6 pg (ref 26.0–34.0)
MCHC: 31.6 g/dL (ref 30.0–36.0)
MCV: 87.4 fL (ref 80.0–100.0)
Monocytes Absolute: 0.5 10*3/uL (ref 0.1–1.0)
Monocytes Relative: 8 %
Neutro Abs: 3.9 10*3/uL (ref 1.7–7.7)
Neutrophils Relative %: 69 %
Platelets: 235 10*3/uL (ref 150–400)
RBC: 4.38 MIL/uL (ref 3.87–5.11)
RDW: 16.5 % — ABNORMAL HIGH (ref 11.5–15.5)
WBC: 5.7 10*3/uL (ref 4.0–10.5)
nRBC: 0 % (ref 0.0–0.2)

## 2019-06-16 ENCOUNTER — Telehealth: Payer: Self-pay | Admitting: Hematology

## 2019-06-16 LAB — KAPPA/LAMBDA LIGHT CHAINS
Kappa free light chain: 52.1 mg/L — ABNORMAL HIGH (ref 3.3–19.4)
Kappa, lambda light chain ratio: 3.18 — ABNORMAL HIGH (ref 0.26–1.65)
Lambda free light chains: 16.4 mg/L (ref 5.7–26.3)

## 2019-06-16 NOTE — Telephone Encounter (Signed)
Scheduled appt per 1/21 los.  Spoke with pt and patient requested a calendar be mailed out.  Sent a message to HIM pool to get a calendar mailed out.

## 2019-06-19 LAB — MULTIPLE MYELOMA PANEL, SERUM
Albumin SerPl Elph-Mcnc: 3.5 g/dL (ref 2.9–4.4)
Albumin/Glob SerPl: 1.2 (ref 0.7–1.7)
Alpha 1: 0.2 g/dL (ref 0.0–0.4)
Alpha2 Glob SerPl Elph-Mcnc: 0.6 g/dL (ref 0.4–1.0)
B-Globulin SerPl Elph-Mcnc: 1.6 g/dL — ABNORMAL HIGH (ref 0.7–1.3)
Gamma Glob SerPl Elph-Mcnc: 0.7 g/dL (ref 0.4–1.8)
Globulin, Total: 3.1 g/dL (ref 2.2–3.9)
IgA: 1008 mg/dL — ABNORMAL HIGH (ref 64–422)
IgG (Immunoglobin G), Serum: 827 mg/dL (ref 586–1602)
IgM (Immunoglobulin M), Srm: 51 mg/dL (ref 26–217)
M Protein SerPl Elph-Mcnc: 0.9 g/dL — ABNORMAL HIGH
Total Protein ELP: 6.6 g/dL (ref 6.0–8.5)

## 2019-06-23 ENCOUNTER — Ambulatory Visit: Payer: Medicare Other

## 2019-06-27 DIAGNOSIS — E119 Type 2 diabetes mellitus without complications: Secondary | ICD-10-CM | POA: Diagnosis not present

## 2019-06-27 DIAGNOSIS — M797 Fibromyalgia: Secondary | ICD-10-CM | POA: Diagnosis not present

## 2019-06-27 DIAGNOSIS — I10 Essential (primary) hypertension: Secondary | ICD-10-CM | POA: Diagnosis not present

## 2019-06-27 DIAGNOSIS — M545 Low back pain: Secondary | ICD-10-CM | POA: Diagnosis not present

## 2019-06-29 DIAGNOSIS — E119 Type 2 diabetes mellitus without complications: Secondary | ICD-10-CM | POA: Diagnosis not present

## 2019-06-29 DIAGNOSIS — E559 Vitamin D deficiency, unspecified: Secondary | ICD-10-CM | POA: Diagnosis not present

## 2019-06-29 DIAGNOSIS — Z1159 Encounter for screening for other viral diseases: Secondary | ICD-10-CM | POA: Diagnosis not present

## 2019-06-29 DIAGNOSIS — R5383 Other fatigue: Secondary | ICD-10-CM | POA: Diagnosis not present

## 2019-06-29 DIAGNOSIS — M129 Arthropathy, unspecified: Secondary | ICD-10-CM | POA: Diagnosis not present

## 2019-06-29 DIAGNOSIS — Z79899 Other long term (current) drug therapy: Secondary | ICD-10-CM | POA: Diagnosis not present

## 2019-06-30 ENCOUNTER — Telehealth: Payer: Self-pay

## 2019-06-30 NOTE — Telephone Encounter (Signed)
I spoke with Ms Vandergriff and let her know per Dr Burr Medico, the M=protein was slightly higher than before, light chain is stable and there are no concerns.  We will continue to monitor.  Pt verbalized understanding.

## 2019-07-04 ENCOUNTER — Ambulatory Visit: Payer: Medicare Other

## 2019-07-04 ENCOUNTER — Ambulatory Visit: Payer: Medicare Other | Attending: Internal Medicine

## 2019-07-04 DIAGNOSIS — Z23 Encounter for immunization: Secondary | ICD-10-CM | POA: Insufficient documentation

## 2019-07-04 NOTE — Progress Notes (Signed)
   Covid-19 Vaccination Clinic  Name:  Julie [name]    MRN: [id number] DOB: 20-May-1933  07/04/2019  [name] was observed post Covid-19 immunization for 15 minutes without incidence. She was provided with Vaccine Information Sheet and instruction to access the V-Safe system.   [name] was instructed to call 911 with any severe reactions post vaccine: [name] Difficulty breathing  . Swelling of your face and throat  . A fast heartbeat  . A bad rash all over your body  . Dizziness and weakness    Immunizations Administered    Name Date Dose VIS Date Route   Pfizer COVID-19 Vaccine 07/04/2019 10:10 AM 0.3 mL 05/05/2019 Intramuscular   Manufacturer: [place]   Lot: [id number]   [place]: [id number]

## 2019-07-06 ENCOUNTER — Ambulatory Visit: Payer: Medicare Other

## 2019-07-20 DIAGNOSIS — M8589 Other specified disorders of bone density and structure, multiple sites: Secondary | ICD-10-CM | POA: Diagnosis not present

## 2019-07-20 DIAGNOSIS — R229 Localized swelling, mass and lump, unspecified: Secondary | ICD-10-CM | POA: Diagnosis not present

## 2019-07-20 DIAGNOSIS — M8580 Other specified disorders of bone density and structure, unspecified site: Secondary | ICD-10-CM | POA: Diagnosis not present

## 2019-07-27 DIAGNOSIS — M797 Fibromyalgia: Secondary | ICD-10-CM | POA: Diagnosis not present

## 2019-07-27 DIAGNOSIS — Z79899 Other long term (current) drug therapy: Secondary | ICD-10-CM | POA: Diagnosis not present

## 2019-07-27 DIAGNOSIS — E119 Type 2 diabetes mellitus without complications: Secondary | ICD-10-CM | POA: Diagnosis not present

## 2019-07-27 DIAGNOSIS — I10 Essential (primary) hypertension: Secondary | ICD-10-CM | POA: Diagnosis not present

## 2019-07-27 DIAGNOSIS — M545 Low back pain: Secondary | ICD-10-CM | POA: Diagnosis not present

## 2019-08-01 ENCOUNTER — Ambulatory Visit: Payer: Medicare Other | Attending: Internal Medicine

## 2019-08-01 DIAGNOSIS — Z23 Encounter for immunization: Secondary | ICD-10-CM

## 2019-08-01 NOTE — Progress Notes (Signed)
   Covid-19 Vaccination Clinic  Name:  Julie [name]    MRN: [id number] DOB: 1933/02/01  08/01/2019  [name] was observed post Covid-19 immunization for 15 minutes without incident. She was provided with Vaccine Information Sheet and instruction to access the V-Safe system.   [name] was instructed to call 911 with any severe reactions post vaccine: [name] Difficulty breathing  . Swelling of face and throat  . A fast heartbeat  . A bad rash all over body  . Dizziness and weakness   Immunizations Administered    Name Date Dose VIS Date Route   Pfizer COVID-19 Vaccine 08/01/2019  8:36 AM 0.3 mL 05/05/2019 Intramuscular   Manufacturer: [place]   Lot: [id number]   [place]: [id number]

## 2019-09-05 DIAGNOSIS — M6281 Muscle weakness (generalized): Secondary | ICD-10-CM | POA: Diagnosis not present

## 2019-09-05 DIAGNOSIS — M0579 Rheumatoid arthritis with rheumatoid factor of multiple sites without organ or systems involvement: Secondary | ICD-10-CM | POA: Diagnosis not present

## 2019-09-05 DIAGNOSIS — M5030 Other cervical disc degeneration, unspecified cervical region: Secondary | ICD-10-CM | POA: Diagnosis not present

## 2019-09-05 DIAGNOSIS — D472 Monoclonal gammopathy: Secondary | ICD-10-CM | POA: Diagnosis not present

## 2019-09-05 DIAGNOSIS — M4800 Spinal stenosis, site unspecified: Secondary | ICD-10-CM | POA: Diagnosis not present

## 2019-09-05 DIAGNOSIS — Z79899 Other long term (current) drug therapy: Secondary | ICD-10-CM | POA: Diagnosis not present

## 2019-09-05 DIAGNOSIS — M8580 Other specified disorders of bone density and structure, unspecified site: Secondary | ICD-10-CM | POA: Diagnosis not present

## 2019-09-05 DIAGNOSIS — M130 Polyarthritis, unspecified: Secondary | ICD-10-CM | POA: Diagnosis not present

## 2019-09-05 DIAGNOSIS — M5137 Other intervertebral disc degeneration, lumbosacral region: Secondary | ICD-10-CM | POA: Diagnosis not present

## 2019-09-05 DIAGNOSIS — R700 Elevated erythrocyte sedimentation rate: Secondary | ICD-10-CM | POA: Diagnosis not present

## 2019-09-05 DIAGNOSIS — M549 Dorsalgia, unspecified: Secondary | ICD-10-CM | POA: Diagnosis not present

## 2019-09-05 DIAGNOSIS — M25532 Pain in left wrist: Secondary | ICD-10-CM | POA: Diagnosis not present

## 2019-09-06 ENCOUNTER — Encounter (HOSPITAL_COMMUNITY): Payer: Self-pay | Admitting: *Deleted

## 2019-09-06 ENCOUNTER — Emergency Department (HOSPITAL_COMMUNITY): Payer: Medicare Other

## 2019-09-06 ENCOUNTER — Other Ambulatory Visit: Payer: Self-pay

## 2019-09-06 ENCOUNTER — Emergency Department (HOSPITAL_COMMUNITY)
Admission: EM | Admit: 2019-09-06 | Discharge: 2019-09-07 | Disposition: A | Discharge status: Home / Self Care | Payer: Medicare Other | Attending: Emergency Medicine | Admitting: Emergency Medicine

## 2019-09-06 DIAGNOSIS — N179 Acute kidney failure, unspecified: Secondary | ICD-10-CM | POA: Diagnosis not present

## 2019-09-06 DIAGNOSIS — Z853 Personal history of malignant neoplasm of breast: Secondary | ICD-10-CM | POA: Insufficient documentation

## 2019-09-06 DIAGNOSIS — M545 Low back pain, unspecified: Secondary | ICD-10-CM

## 2019-09-06 DIAGNOSIS — I10 Essential (primary) hypertension: Secondary | ICD-10-CM | POA: Diagnosis not present

## 2019-09-06 DIAGNOSIS — M069 Rheumatoid arthritis, unspecified: Secondary | ICD-10-CM | POA: Diagnosis not present

## 2019-09-06 DIAGNOSIS — M797 Fibromyalgia: Secondary | ICD-10-CM | POA: Insufficient documentation

## 2019-09-06 DIAGNOSIS — M5489 Other dorsalgia: Secondary | ICD-10-CM | POA: Diagnosis not present

## 2019-09-06 DIAGNOSIS — M549 Dorsalgia, unspecified: Secondary | ICD-10-CM | POA: Diagnosis not present

## 2019-09-06 MED ORDER — FENTANYL CITRATE (PF) 100 MCG/2ML IJ SOLN
25.0000 ug | Freq: Once | INTRAMUSCULAR | Status: AC
  Administered 2019-09-06: 25 ug via INTRAMUSCULAR
  Filled 2019-09-06: qty 2

## 2019-09-06 MED ORDER — METHOCARBAMOL 500 MG PO TABS
1000.0000 mg | ORAL_TABLET | Freq: Once | ORAL | Status: AC
  Administered 2019-09-06: 1000 mg via ORAL
  Filled 2019-09-06: qty 2

## 2019-09-06 MED ORDER — ACETAMINOPHEN 500 MG PO TABS
1000.0000 mg | ORAL_TABLET | Freq: Once | ORAL | Status: AC
  Administered 2019-09-06: 1000 mg via ORAL
  Filled 2019-09-06: qty 2

## 2019-09-06 NOTE — ED Provider Notes (Signed)
[facility] Emergency Department Provider Note MRN:  [id number]  Arrival date & time: 09/07/19     Chief Complaint   Back Pain   History of Present Illness   Julie [name] is a 86 y.o. year-old female with a history of fibromyalgia, rheumatoid arthritis presenting to the ED with chief complaint of back pain.  Worsening back pain for the past 2 or 3 days, unable to walk today due to the pain.  Explains that she was very busy around the house and in the kitchen during [place] time and she thinks that she "overdid it".  She denies any trauma.  Denies fever, no neck pain, no chest pain, no shortness of breath, no abdominal pain, no bowel or bladder dysfunction, no numbness or weakness to the arms or legs.  Review of Systems  A complete 10 system review of systems was obtained and all systems are negative except as noted in the HPI and PMH.   Patient's Health History    Past Medical History:  Diagnosis Date  . Anemia   . Anxiety    takes Klonopin at bedtime  . Arthritis    takes Methotrexate weekly and Prednisone  . Blood transfusion    no abnormal reaction noted  . Cancer (HCC)    breast   . Cataracts, bilateral    immature  . Complication of anesthesia    wakes up during surgery  . Depression   . Diabetes mellitus without complication ([place])   . Dyspnea    with exertion since chemo   . Fibromyalgia    takes Lyrica daily  . GERD (gastroesophageal reflux disease)    takes Nexium daily  . Glaucoma   . Hard of hearing   . Heart murmur   . Histoplasmosis   . History of colon polyps   . History of gout   . History of hiatal hernia   . History of shingles   . Hyperlipidemia   . Hypertension    takes Amlodipine,Metoprolol,and Losartan daily  . Incontinence of bowel   . Interstitial cystitis   . Joint pain   . Low back pain   . Nocturia   . Peripheral neuropathy   . Peripheral neuropathy   . Personal history of chemotherapy   . Personal  history of radiation therapy   . PONV (postoperative nausea and vomiting)   . Ulcer 1970   duodenal  . Urinary frequency   . Urinary urgency   . Weakness    numbness in both hands and feet    Past Surgical History:  Procedure Laterality Date  . ABDOMINAL HYSTERECTOMY    . accupuncture  3 yrs ago  . ANKLE FUSION Left 05/10/2014   Procedure: LEFT ANKLE ARTHRODESIS ;  Surgeon: Wylene Simmer, MD;  Location: [place];  Service: Orthopedics;  Laterality: Left;  . ANKLE SURGERY Left   . arm surgery Left    radius  . bladder tacked     . BREAST LUMPECTOMY Left   . CERVICAL FUSION    . CHOLECYSTECTOMY    . COLONOSCOPY    . CYSTOCELE REPAIR    . DILATION AND CURETTAGE OF UTERUS    . ESOPHAGOGASTRODUODENOSCOPY    . PORT A CATH REVISION N/A 12/27/2015   Procedure: PORT A CATH REVISION;  Surgeon: Rolm Bookbinder, MD;  Location: [place];  Service: General;  Laterality: N/A;  . PORT-A-CATH REMOVAL N/A 03/25/2016   Procedure: REMOVAL PORT-A-CATH;  Surgeon: Rolm Bookbinder, MD;  [facility];  Service: General;  Laterality: N/A;  . PORTACATH PLACEMENT Right 12/27/2015   Procedure: INSERTION PORT-A-CATH WITH ULTRASOUND ;  Surgeon: Rolm Bookbinder, MD;  Location: [place];  Service: General;  Laterality: Right;  . RADIOACTIVE SEED GUIDED PARTIAL MASTECTOMY WITH AXILLARY SENTINEL LYMPH NODE BIOPSY Left 11/14/2015   Procedure: RADIOACTIVE SEED GUIDED LEFT BREAST LUMPECTOMY WITH AXILLARY SENTINEL LYMPH NODE BIOPSY;  Surgeon: Rolm Bookbinder, MD;  Location: [place];  Service: General;  Laterality: Left;  RADIOACTIVE SEED GUIDED LEFT BREAST LUMPECTOMY WITH AXILLARY SENTINEL LYMPH NODE BIOPSY  . SALPINGOOPHORECTOMY Bilateral   . TOTAL KNEE ARTHROPLASTY Right 02/28/2015   Procedure: RIGHT TOTAL KNEE ARTHROPLASTY;  Surgeon: Rod Can, MD;  [facility];  Service: Orthopedics;  Laterality: Right;  . UPPER GASTROINTESTINAL ENDOSCOPY    . VAGINAL  HYSTERECTOMY      Family History  Problem Relation Age of Onset  . Colon cancer Sister 48  . Esophageal cancer Brother 47  . Breast cancer Mother   . Brain cancer Other   . Other Father        cerebral hemorrhage    Social History   Socioeconomic History  . Marital status: Divorced    Spouse name: Not on file  . Number of children: 5  . Years of education: some college  . Highest education level: Not on file  Occupational History  . Occupation: Retired  Tobacco Use  . Smoking status: Never Smoker  . Smokeless tobacco: Never Used  Substance and Sexual Activity  . Alcohol use: No    Comment: IN [place] ONLY  . Drug use: No  . Sexual activity: Not on file  Other Topics Concern  . Not on file  Social History Narrative   Lives at home with her daughter.   Right-handed.   0.5 cup caffeine per day.   Social Determinants of Health   Financial Resource Strain:   . Difficulty of Paying Living Expenses:   Food Insecurity:   . Worried About Charity fundraiser in the Last Year:   . Arboriculturist in the Last Year:   Transportation Needs:   . Film/video editor (Medical):   [name] Lack of Transportation (Non-Medical):   Physical Activity:   . Days of Exercise per Week:   . Minutes of Exercise per Session:   Stress:   . Feeling of Stress :   Social Connections:   . Frequency of Communication with Friends and Family:   . Frequency of Social Gatherings with Friends and Family:   . Attends Religious Services:   . Active Member of Clubs or Organizations:   . Attends Archivist Meetings:   [name] Marital Status:   Intimate Partner Violence:   . Fear of Current or Ex-Partner:   . Emotionally Abused:   [name] Physically Abused:   . Sexually Abused:      Physical Exam   Vitals:   09/06/19 2208 09/06/19 2300  BP: 136/73 137/69  Pulse: 68 65  Resp: 16   Temp: 98.7 F (37.1 C)   SpO2: 96% 97%    CONSTITUTIONAL: Well-appearing, NAD NEURO:  Alert and oriented x 3, no  focal deficits EYES:  eyes equal and reactive ENT/NECK:  no LAD, no JVD CARDIO: Regular rate, well-perfused, normal S1 and S2 PULM:  CTAB no wheezing or rhonchi GI/GU:  normal bowel sounds, non-distended, non-tender MSK/SPINE:  No gross deformities, no edema SKIN:  no rash, atraumatic PSYCH:  Appropriate speech and behavior  *Additional and/or pertinent findings included in MDM below  Diagnostic and Interventional Summary    EKG Interpretation  Date/Time:    Ventricular Rate:    PR Interval:    QRS Duration:   QT Interval:    QTC Calculation:   R Axis:     Text Interpretation:        Labs Reviewed  CBC  COMPREHENSIVE METABOLIC PANEL    CT Lumbar Spine Wo Contrast    (Results Pending)    Medications  fentaNYL (SUBLIMAZE) injection 25 mcg (25 mcg Intramuscular Given 09/06/19 2345)  acetaminophen (TYLENOL) tablet 1,000 mg (1,000 mg Oral Given 09/06/19 2342)  methocarbamol (ROBAXIN) tablet 1,000 mg (1,000 mg Oral Given 09/06/19 2342)     Procedures  /  Critical Care Procedures  ED Course and Medical Decision Making  I have reviewed the triage vital signs, the nursing notes, and pertinent available records from the EMR.  Listed above are laboratory and imaging tests that I personally ordered, reviewed, and interpreted and then considered in my medical decision making (see below for details).     Seems to be an acute on chronic flare of chronic low back pain due to degenerative disc disease, however she explains that it is very unlike her to not be able to ambulate.  It seems more like difficulty ambulating due to pain rather than actual weakness.  Patient is chronically on steroids and immunosuppressants, advanced age, she is at increased risk for compression fracture.  Will evaluate with CT imaging.  Currently there are no signs or symptoms of cauda equina or myelopathy and therefore no indication for MRI at this time.  Signed out to oncoming provider at shift  change.  Barth Kirks. Sedonia Small, [place] [email or web address]  Final Clinical Impressions(s) / ED Diagnoses     ICD-10-CM   1. Acute midline low back pain without sciatica  M54.5     ED Discharge Orders    None       Discharge Instructions Discussed with and Provided to Patient:     Discharge Instructions     You were evaluated in the Emergency Department and after careful evaluation, we did not find any emergent condition requiring admission or further testing in the hospital.  Your exam/testing today was overall reassuring.  Please return to the Emergency Department if you experience any worsening of your condition.  We encourage you to follow up with a primary care provider.  Thank you for allowing [place] to be a part of your care.        Maudie Flakes, MD 09/07/19 [phone]

## 2019-09-06 NOTE — ED Triage Notes (Signed)
Pt is from home where she lives with her daughter.  Pt is here due to worsening of chronic back pain.  Pt has been active since easter and has a flare up of DDD.  Pt has back brace in place.  She states that the back pain is "excruciating" even with the back brace in place.  No trauma or falls. Pt denies any focal weakness.

## 2019-09-07 ENCOUNTER — Emergency Department (HOSPITAL_COMMUNITY): Payer: Medicare Other

## 2019-09-07 DIAGNOSIS — M545 Low back pain: Secondary | ICD-10-CM | POA: Diagnosis not present

## 2019-09-07 DIAGNOSIS — M549 Dorsalgia, unspecified: Secondary | ICD-10-CM | POA: Diagnosis not present

## 2019-09-07 DIAGNOSIS — N179 Acute kidney failure, unspecified: Secondary | ICD-10-CM | POA: Diagnosis not present

## 2019-09-07 LAB — COMPREHENSIVE METABOLIC PANEL
ALT: 14 U/L (ref 0–44)
AST: 21 U/L (ref 15–41)
Albumin: 3.7 g/dL (ref 3.5–5.0)
Alkaline Phosphatase: 69 U/L (ref 38–126)
Anion gap: 12 (ref 5–15)
BUN: 27 mg/dL — ABNORMAL HIGH (ref 8–23)
CO2: 27 mmol/L (ref 22–32)
Calcium: 9.4 mg/dL (ref 8.9–10.3)
Chloride: 102 mmol/L (ref 98–111)
Creatinine, Ser: 1.24 mg/dL — ABNORMAL HIGH (ref 0.44–1.00)
GFR calc Af Amer: 46 mL/min — ABNORMAL LOW (ref >60)
GFR calc non Af Amer: 40 mL/min — ABNORMAL LOW (ref >60)
Glucose, Bld: 143 mg/dL — ABNORMAL HIGH (ref 70–99)
Potassium: 3.7 mmol/L (ref 3.5–5.1)
Sodium: 141 mmol/L (ref 135–145)
Total Bilirubin: 1.2 mg/dL (ref 0.3–1.2)
Total Protein: 7.4 g/dL (ref 6.5–8.1)

## 2019-09-07 LAB — CBC
HCT: 37.3 % (ref 36.0–46.0)
Hemoglobin: 11.6 g/dL — ABNORMAL LOW (ref 12.0–15.0)
MCH: 27.4 pg (ref 26.0–34.0)
MCHC: 31.1 g/dL (ref 30.0–36.0)
MCV: 88.2 fL (ref 80.0–100.0)
Platelets: 194 10*3/uL (ref 150–400)
RBC: 4.23 MIL/uL (ref 3.87–5.11)
RDW: 14.3 % (ref 11.5–15.5)
WBC: 8.9 10*3/uL (ref 4.0–10.5)
nRBC: 0 % (ref 0.0–0.2)

## 2019-09-07 MED ORDER — FENTANYL CITRATE (PF) 100 MCG/2ML IJ SOLN
50.0000 ug | Freq: Once | INTRAMUSCULAR | Status: AC
  Administered 2019-09-07: 02:00:00 50 ug via INTRAVENOUS
  Filled 2019-09-07: qty 2

## 2019-09-07 MED ORDER — ONDANSETRON 4 MG PO TBDP: 4.0000 mg | ORAL_TABLET | Freq: Four times a day (QID) | ORAL | 0 refills | Status: DC | PRN

## 2019-09-07 MED ORDER — HYDROCODONE-ACETAMINOPHEN 5-325 MG PO TABS: 1.0000 | ORAL_TABLET | Freq: Four times a day (QID) | ORAL | 0 refills | Status: DC | PRN

## 2019-09-07 MED ORDER — METHOCARBAMOL 500 MG PO TABS: 500.0000 mg | ORAL_TABLET | Freq: Three times a day (TID) | ORAL | 0 refills | Status: DC | PRN

## 2019-09-07 MED ORDER — DOCUSATE SODIUM 100 MG PO CAPS: 100.0000 mg | ORAL_CAPSULE | Freq: Two times a day (BID) | ORAL | 0 refills | Status: DC

## 2019-09-07 MED ORDER — SODIUM CHLORIDE 0.9 % IV BOLUS (SEPSIS)
500.0000 mL | Freq: Once | INTRAVENOUS | Status: AC
  Administered 2019-09-07: 03:00:00 500 mL via INTRAVENOUS

## 2019-09-07 NOTE — Discharge Instructions (Addendum)
You were evaluated in the Emergency Department and after careful evaluation, we did not find any emergent condition requiring admission or further testing in the hospital.  Your CT scan did not show any fracture but does show multiple areas of foraminal stenosis that could be causing pain.  We have provided you with neurosurgery follow-up information if you do not feel your pain is getting better with symptomatic treatment.  Your kidney function was mildly elevated with a creatinine of 1.24.  I recommend increasing your fluid intake at home and following up with your PCP in 1 to 2 weeks to have this rechecked.  At this time I recommend avoiding aspirin, ibuprofen/Motrin, naproxen/Aleve, Goody powders.  Otherwise your exam/testing today was overall reassuring.  You may alternate ice and heat to your back for pain control.  I recommend stretching and you may slowly resume mild activity.  I recommend that you do not lift anything over 5 pounds at this time.  I recommend close follow-up with your primary care physician as you may benefit from physical therapy as an outpatient.   Please return to the Emergency Department if you experience any worsening of your condition.  We encourage you to follow up with a primary care provider.  Thank you for allowing [place] to be a part of your care.   You are being provided a prescription for opiates (also known as narcotics) for pain control.  Opiates can be addictive and should only be used when absolutely necessary for pain control when other alternatives do not work.  We recommend you only use them for the recommended amount of time and only as prescribed.  Please do not take with other sedative medications or alcohol.  Please do not drive, operate machinery, make important decisions while taking opiates.  Please note that these medications can be addictive and have high abuse potential.  Patients can become addicted to narcotics after only taking them for a few days.   Please keep these medications locked away from children, teenagers or any family members with history of substance abuse.  Narcotic pain medicine may also make you constipated.  You may use over-the-counter medications such as MiraLAX, Colace to prevent constipation.  If you become constipated you may use over-the-counter enemas as needed.  Itching and nausea are common side effects of narcotic pain medication.  If you develop uncontrolled vomiting or a rash, please stop these medications.  We are also prescribing you with a prescription of Robaxin also called methocarbamol.  Please do not take this at the same time with hydrocodone.  I would wait at least 2 hours in between taking these medications.  They both may make you drowsy.

## 2019-09-07 NOTE — ED Provider Notes (Signed)
12:55 AM  Assumed care.  Patient is a 86 year old female with history of RA on prednisone who presents to the emergency department with progressively worsening lower back pain and difficulty walking due to pain since Easter.  No known acute injury.  No focal neurologic deficits.  CT scan pending to evaluate for possible compression fracture.  Plan is to reassess after CT imaging.  If unremarkable, will attempt to ambulate.  1:35 AM  Pt's CT scan reviewed/interpreted.  CTA reveals severe right and moderate left L3-L4 foraminal stenosis.  He also has severe right L4-L5 foraminal stenosis.  Has moderate bilateral L5-S1 foraminal stenosis.  Mild spinal canal stenosis at L3-L4.  No fractures.  Will attempt to ambulate after receiving pain medications.  2:00 AM  Pt was able to ambulate here.  Does use a walker at home.  Labs currently pending.  Will give second dose of pain medicine but suspect discharge home with outpatient follow-up.  3:20 AM  Pt's labs have been reviewed/interpreted.  Creatinine mildly elevated at 1.24 which is new for her.  Recommended we avoid NSAIDs at this time and will give 500 mL IV fluid bolus recommend follow-up with PCP to recheck creatinine.  Recommend increase fluid intake at home.  Daughter reports that she does not eat and drink as much as she should.  Will discharge with prescriptions of Vicodin and Robaxin.  Recommended close PCP follow-up as she may benefit from physical therapy as an outpatient.  We will also provide with neurosurgery outpatient information as needed.  No sign of any emergent surgical issue today.  She has no numbness or focal weakness.  No bowel or bladder incontinence.  No fever.  Patient and daughter are comfortable with this plan.  At this time, I do not feel there is any life-threatening condition present. I have reviewed, interpreted and discussed all results (EKG, imaging, lab, urine as appropriate) and exam findings with patient/family. I have  reviewed nursing notes and appropriate previous records.  I feel the patient is safe to be discharged home without further emergent workup and can continue workup as an outpatient as needed. Discussed usual and customary return precautions. Patient/family verbalize understanding and are comfortable with this plan.  Outpatient follow-up has been provided as needed. All questions have been answered.    Samarah Hogle, Delice Bison, DO 09/07/19 [phone]

## 2019-09-07 NOTE — ED Notes (Signed)
Unable to obtain bloodwork x 2. Pt and her family member state that usually IV team or phlebotomy have to be called.  RN made aware of same.

## 2019-09-07 NOTE — ED Notes (Signed)
Helped pt sit up and stand up and ambulate in room. Pt was able to sit up and stand up and take a few steps.  This activity was painful for pt and she required assistance as she walks with a walker at home.

## 2019-09-08 DIAGNOSIS — G894 Chronic pain syndrome: Secondary | ICD-10-CM | POA: Diagnosis not present

## 2019-09-08 DIAGNOSIS — Z79899 Other long term (current) drug therapy: Secondary | ICD-10-CM | POA: Diagnosis not present

## 2019-09-08 DIAGNOSIS — M797 Fibromyalgia: Secondary | ICD-10-CM | POA: Diagnosis not present

## 2019-09-08 DIAGNOSIS — M545 Low back pain: Secondary | ICD-10-CM | POA: Diagnosis not present

## 2019-09-08 DIAGNOSIS — E119 Type 2 diabetes mellitus without complications: Secondary | ICD-10-CM | POA: Diagnosis not present

## 2019-09-14 DIAGNOSIS — H6123 Impacted cerumen, bilateral: Secondary | ICD-10-CM | POA: Diagnosis not present

## 2019-09-26 DIAGNOSIS — M545 Low back pain: Secondary | ICD-10-CM | POA: Diagnosis not present

## 2019-09-26 DIAGNOSIS — E119 Type 2 diabetes mellitus without complications: Secondary | ICD-10-CM | POA: Diagnosis not present

## 2019-09-26 DIAGNOSIS — M797 Fibromyalgia: Secondary | ICD-10-CM | POA: Diagnosis not present

## 2019-09-26 DIAGNOSIS — Z79899 Other long term (current) drug therapy: Secondary | ICD-10-CM | POA: Diagnosis not present

## 2019-10-02 ENCOUNTER — Emergency Department (HOSPITAL_COMMUNITY)
Admission: EM | Admit: 2019-10-02 | Discharge: 2019-10-02 | Disposition: A | Discharge status: Home / Self Care | Payer: Medicare Other | Attending: Emergency Medicine | Admitting: Emergency Medicine

## 2019-10-02 ENCOUNTER — Other Ambulatory Visit: Payer: Self-pay

## 2019-10-02 ENCOUNTER — Encounter (HOSPITAL_COMMUNITY): Payer: Self-pay

## 2019-10-02 DIAGNOSIS — Z5321 Procedure and treatment not carried out due to patient leaving prior to being seen by health care provider: Secondary | ICD-10-CM | POA: Diagnosis not present

## 2019-10-02 DIAGNOSIS — R531 Weakness: Secondary | ICD-10-CM | POA: Insufficient documentation

## 2019-10-02 DIAGNOSIS — M79605 Pain in left leg: Secondary | ICD-10-CM | POA: Diagnosis not present

## 2019-10-02 DIAGNOSIS — Z79899 Other long term (current) drug therapy: Secondary | ICD-10-CM | POA: Diagnosis not present

## 2019-10-02 DIAGNOSIS — M545 Low back pain: Secondary | ICD-10-CM | POA: Diagnosis not present

## 2019-10-02 DIAGNOSIS — M797 Fibromyalgia: Secondary | ICD-10-CM | POA: Diagnosis not present

## 2019-10-02 DIAGNOSIS — E119 Type 2 diabetes mellitus without complications: Secondary | ICD-10-CM | POA: Diagnosis not present

## 2019-10-02 DIAGNOSIS — M79604 Pain in right leg: Secondary | ICD-10-CM | POA: Diagnosis not present

## 2019-10-02 NOTE — ED Triage Notes (Signed)
Pt c/o BLE pain and weakness. Says her "body is too weak for her bones" Per support person it has been ongoing issue but unable to walk at baseline.

## 2019-10-02 NOTE — ED Notes (Signed)
I called patient for a room and no one answered

## 2019-10-16 DIAGNOSIS — R251 Tremor, unspecified: Secondary | ICD-10-CM | POA: Diagnosis not present

## 2019-10-16 DIAGNOSIS — R413 Other amnesia: Secondary | ICD-10-CM | POA: Diagnosis not present

## 2019-10-16 DIAGNOSIS — M4316 Spondylolisthesis, lumbar region: Secondary | ICD-10-CM | POA: Diagnosis not present

## 2019-10-16 DIAGNOSIS — M4800 Spinal stenosis, site unspecified: Secondary | ICD-10-CM | POA: Diagnosis not present

## 2019-10-16 DIAGNOSIS — R1310 Dysphagia, unspecified: Secondary | ICD-10-CM | POA: Diagnosis not present

## 2019-10-16 DIAGNOSIS — K219 Gastro-esophageal reflux disease without esophagitis: Secondary | ICD-10-CM | POA: Diagnosis not present

## 2019-10-16 DIAGNOSIS — K5900 Constipation, unspecified: Secondary | ICD-10-CM | POA: Diagnosis not present

## 2019-10-26 DIAGNOSIS — E119 Type 2 diabetes mellitus without complications: Secondary | ICD-10-CM | POA: Diagnosis not present

## 2019-10-26 DIAGNOSIS — Z79899 Other long term (current) drug therapy: Secondary | ICD-10-CM | POA: Diagnosis not present

## 2019-10-26 DIAGNOSIS — M797 Fibromyalgia: Secondary | ICD-10-CM | POA: Diagnosis not present

## 2019-10-26 DIAGNOSIS — M545 Low back pain: Secondary | ICD-10-CM | POA: Diagnosis not present

## 2019-11-02 ENCOUNTER — Ambulatory Visit: Payer: Medicare Other | Admitting: Dietician

## 2019-11-09 ENCOUNTER — Telehealth: Payer: Self-pay | Admitting: Physical Therapy

## 2019-11-09 DIAGNOSIS — R2689 Other abnormalities of gait and mobility: Secondary | ICD-10-CM

## 2019-11-09 NOTE — Telephone Encounter (Signed)
Ordered entered for PT, aquatic therapy

## 2019-11-23 ENCOUNTER — Other Ambulatory Visit: Payer: Self-pay

## 2019-11-23 ENCOUNTER — Ambulatory Visit
Admission: RE | Admit: 2019-11-23 | Discharge: 2019-11-23 | Disposition: A | Discharge status: Home / Self Care | Payer: Medicare Other | Source: Ambulatory Visit | Attending: Hematology | Admitting: Hematology

## 2019-11-23 DIAGNOSIS — Z171 Estrogen receptor negative status [ER-]: Secondary | ICD-10-CM

## 2019-12-11 DIAGNOSIS — K219 Gastro-esophageal reflux disease without esophagitis: Secondary | ICD-10-CM | POA: Diagnosis not present

## 2019-12-11 DIAGNOSIS — K5900 Constipation, unspecified: Secondary | ICD-10-CM | POA: Diagnosis not present

## 2019-12-11 NOTE — Progress Notes (Signed)
Jul[name]   Telephone:([phone]) [phone] [phone]) [phone]   Clinic Follow up Note   Patient Care Team: Julie Morning, DO as PCP - General (Family Medicine) Julie Party, MD as Referring Physician (Rheumatology) Julie Bookbinder, MD as Consulting Physician (General Surgery) Julie Merle, MD as Consulting Physician (Hematology) Julie Bison Charlestine Massed, NP as Nurse Practitioner (Hematology and Oncology)  Date of Service:  12/14/2019  CHIEF COMPLAINT: F/u of left breast cancer  SUMMARY OF ONCOLOGIC HISTORY: Oncology History Overview Note  Malignant neoplasm of upper outer quadrant of female breast [facility])   Staging form: Breast, AJCC 7th Edition     Pathologic stage from 11/14/2015: Stage IA (T1c, N0, cM0) - Signed by Julie Merle, MD on 11/25/2015     Clinical stage from 11/20/2015: Stage IA (T1c, N0, M0) - Signed by Julie Bears, MD on 11/20/2015     Malignant neoplasm of upper outer quadrant of female breast ([place])  10/09/2015 Mammogram   Screening bilateral mammograms showed a possible left breast mass. Diagnostic mammogram and ultrasound showed a 7 x 7 x 7 mm mass in the left breast at 11:00 position.   10/22/2015 Receptors her2   ER negative, PR negative, HER-2 not amplified   10/22/2015 Initial Biopsy   Left breast needle core biopsy at the 1:00 position showed invasive ductal carcinoma.   10/22/2015 Initial Diagnosis   Malignant neoplasm of upper outer quadrant of female breast ([place])   11/14/2015 Surgery   Left breast lumpectomy and sentinel lymph node biopsy Julie Julie [name])   11/14/2015 Pathology Results   Left breast lumpectomy showed invasive ductal carcinoma, grade 3, 1.1 cm, resection margins were negative, sentinel lymph node biopsy showed benign breast parenchyma, lymph node tissue not identified. Repeated ER PR and HER-2 were all negative.   12/20/2015 - 03/13/2016 Chemotherapy   Weekly Taxol 80 mg/m x 2 cycles, then changed to Abraxane 100 mg/m for cycles  3-12 due to infusion reaction. Abraxane dose-reduced cycle #9, then again for cycles #11 & #12 due to neuropathy. [total 12 cycles chemo]    04/01/2016 - 05/11/2016 Radiation Therapy   Adjuvant breast radiation (Julie Julie [name]). Left breast: 50.4 Gy in 28 fractions   06/19/2016 Imaging   MR Lumbar Spine without contrast  IMPRESSION: 1. Diffuse lumbar spondylosis with discogenic and facet degenerative changes progressed from 2010 lumbar MRI. 2. Multilevel canal stenosis is mild-to-moderate at L2-3, moderate L3-4, and mild at L4-5. 3. Multiple levels of mild and moderate neural foraminal narrowing with severe foraminal narrowing at the right L3-4 and L4-5 levels.   11/12/2017 Imaging   11/12/2017 DEXA ASSESSMENT: The BMD measured at Forearm Radius 33% is 0.648 g/cm2 with a T-score of -2.7. This patient is considered osteoporotic according to [facility]) criteria.   11/12/2017 Mammogram   11/12/2017 Diagnostic Mammogram IMPRESSION: No mammographic evidence of malignancy in either breast, status post left lumpectomy.      CURRENT THERAPY:  Surveillance  INTERVAL HISTORY:  Julie Julie [name] is here for a follow up of left breast cancer. She was last seen by me 6 months ago. She presents to the clinic with her daughter. She went to ED a few times in the past 3 months after increased activity leading to worsened back pain. Her main pain is in back and right knee. She is now on Tapering prednisone pack since 2 days ago. She was also seen by Dr Julie Julie [name] with endoscopy recently. She also notes recent right arm pain. Her daughter notes occasional tremors in her  arms and hand. She plans to start PT soon.     REVIEW OF SYSTEMS:   Constitutional: Denies fevers, chills or abnormal weight loss Eyes: Denies blurriness of vision Ears, nose, mouth, throat, and face: Denies mucositis or sore throat Respiratory: Denies cough, dyspnea or wheezes Cardiovascular: Denies palpitation, chest  discomfort or lower extremity swelling Gastrointestinal:  Denies nausea, heartburn or change in bowel habits Skin: Denies abnormal skin rashes MSK: (+) back pain and right knee pain Lymphatics: Denies new lymphadenopathy or easy bruising (+) Tremors in her arms and hands.  Neurological:Denies numbness, tingling or new weaknesses Behavioral/Psych: Julie [name] is stable, no new changes  All other systems were reviewed with the patient and are negative.  MEDICAL HISTORY:  Past Medical History:  Diagnosis Date   Anemia    Anxiety    takes Klonopin at bedtime   Arthritis    takes Methotrexate weekly and Prednisone   Blood transfusion    no abnormal reaction noted   Cancer (HCC)    breast    Cataracts, bilateral    immature   Complication of anesthesia    wakes up during surgery   Depression    Diabetes mellitus without complication (HCC)    Dyspnea    with exertion since chemo    Fibromyalgia    takes Lyrica daily   GERD (gastroesophageal reflux disease)    takes Nexium daily   Glaucoma    Hard of hearing    Heart murmur    Histoplasmosis    History of colon polyps    History of gout    History of hiatal hernia    History of shingles    Hyperlipidemia    Hypertension    takes Amlodipine,Metoprolol,and Losartan daily   Incontinence of bowel    Interstitial cystitis    Joint pain    Low back pain    Nocturia    Peripheral neuropathy    Peripheral neuropathy    Personal history of chemotherapy    Personal history of radiation therapy    PONV (postoperative nausea and vomiting)    Ulcer 1970   duodenal   Urinary frequency    Urinary urgency    Weakness    numbness in both hands and feet    SURGICAL HISTORY: Past Surgical History:  Procedure Laterality Date   ABDOMINAL HYSTERECTOMY     accupuncture  3 yrs ago   ANKLE FUSION Left 05/10/2014   Procedure: LEFT ANKLE ARTHRODESIS ;  Surgeon: Wylene Simmer, MD;  Location: [place];   Service: Orthopedics;  Laterality: Left;   ANKLE SURGERY Left    arm surgery Left    radius   bladder tacked      BREAST LUMPECTOMY Left 10/2015   CERVICAL FUSION     CHOLECYSTECTOMY     COLONOSCOPY     CYSTOCELE REPAIR     DILATION AND CURETTAGE OF UTERUS     ESOPHAGOGASTRODUODENOSCOPY     PORT A CATH REVISION N/A 12/27/2015   Procedure: PORT A CATH REVISION;  Surgeon: Julie Bookbinder, MD;  Location: [place];  Service: General;  Laterality: N/A;   PORT-A-CATH REMOVAL N/A 03/25/2016   Procedure: REMOVAL PORT-A-CATH;  Surgeon: Julie Bookbinder, MD;  Location: [facility];  Service: General;  Laterality: N/A;   PORTACATH PLACEMENT Right 12/27/2015   Procedure: INSERTION PORT-A-CATH WITH ULTRASOUND ;  Surgeon: Julie Bookbinder, MD;  Location: [place];  Service: General;  Laterality: Right;   RADIOACTIVE SEED GUIDED PARTIAL  MASTECTOMY WITH AXILLARY SENTINEL LYMPH NODE BIOPSY Left 11/14/2015   Procedure: RADIOACTIVE SEED GUIDED LEFT BREAST LUMPECTOMY WITH AXILLARY SENTINEL LYMPH NODE BIOPSY;  Surgeon: Julie Bookbinder, MD;  Location: [place];  Service: General;  Laterality: Left;  RADIOACTIVE SEED GUIDED LEFT BREAST LUMPECTOMY WITH AXILLARY SENTINEL LYMPH NODE BIOPSY   SALPINGOOPHORECTOMY Bilateral    TOTAL KNEE ARTHROPLASTY Right 02/28/2015   Procedure: RIGHT TOTAL KNEE ARTHROPLASTY;  Surgeon: Rod Can, MD;  Location: [facility];  Service: Orthopedics;  Laterality: Right;   UPPER GASTROINTESTINAL ENDOSCOPY     VAGINAL HYSTERECTOMY      I have reviewed the social history and family history with the patient and they are unchanged from previous note.  ALLERGIES:  is allergic to codeine, other, and aspirin.  MEDICATIONS:  Current Outpatient Medications  Medication Sig Dispense Refill   amitriptyline (ELAVIL) 50 MG tablet Take 50 mg by mouth at bedtime.     amLODipine (NORVASC) 10 MG tablet Take 10 mg by mouth daily.      cholecalciferol (VITAMIN D3) 25 MCG (1000 UNIT) tablet Take 1,000 Units by mouth daily.     diclofenac Sodium (VOLTAREN) 1 % GEL SMARTSIG:3 Inch(es) Topical 3 Times Daily PRN     docusate sodium (COLACE) 100 MG capsule Take 1 capsule (100 mg total) by mouth every 12 (twelve) hours. (Patient taking differently: Take 100 mg by mouth as needed. ) 60 capsule 0   folic acid (FOLVITE) 1 MG tablet Take 2 mg by mouth every Julie [name].      FREESTYLE LITE test strip TEST ONCE EVERY DAY  4   hydroxychloroquine (PLAQUENIL) 200 MG tablet Take 200 mg by mouth daily.     LIDOCAINE PAIN RELIEF 4 % PTCH Apply 1 patch topically 2 (two) times daily.     lovastatin (MEVACOR) 40 MG tablet 1 (ONE) TABLET TAKE IN THE EVENING AFTER DINNER (Patient taking differently: Take 40 mg by mouth every evening. ) 90 tablet 1   metoprolol succinate (TOPROL-XL) 50 MG 24 hr tablet Take 50 mg by mouth every evening.   2   omeprazole (PRILOSEC) 40 MG capsule Take 40 mg by mouth daily.     oxyCODONE (OXY IR/ROXICODONE) 5 MG immediate release tablet Take 5 mg by mouth 3 (three) times daily as needed.     polyethylene glycol (MIRALAX / GLYCOLAX) 17 g packet Take 17 g by mouth daily.     predniSONE (DELTASONE) 5 MG tablet Take 7.5 mg by mouth daily.     predniSONE (STERAPRED UNI-PAK 21 TAB) 5 MG (21) TBPK tablet Take by mouth.     pregabalin (LYRICA) 75 MG capsule Take 75-150 mg by mouth See admin instructions. 75 Mg in the Am and 150 MG at bedtime     valsartan-hydrochlorothiazide (DIOVAN-HCT) 320-12.5 MG tablet Take 0.5 tablets by mouth daily.     No current facility-administered medications for this visit.    PHYSICAL EXAMINATION: ECOG PERFORMANCE STATUS: 3 - Symptomatic, >50% confined to bed  Vitals:   12/14/19 1117  BP: (!) 145/72  Pulse: 64  Resp: 18  Temp: 97.8 F (36.6 C)  SpO2: 100%   Filed Weights   12/14/19 1117  Weight: (!) 215 lb 11.2 oz (97.8 kg)    GENERAL:alert, no distress and  comfortable SKIN: skin color, texture, turgor are normal, no rashes or significant lesions EYES: normal, Conjunctiva are pink and non-injected, sclera clear  NECK: supple, thyroid normal size, non-tender, without nodularity LYMPH:  no palpable lymphadenopathy in the  cervical, axillary  LUNGS: clear to auscultation and percussion with normal breathing effort HEART: regular rate & rhythm and no murmurs and no lower extremity edema ABDOMEN:abdomen soft, non-tender and normal bowel sounds Musculoskeletal:no cyanosis of digits and no clubbing  NEURO: alert & oriented x 3 with fluent speech, no focal motor/sensory deficits BREAST: S/p left lumpectomy: Surgical incision healed well with mild scar tissue. No palpable mass, nodules or adenopathy bilaterally. Breast exam benign.   LABORATORY DATA:  I have reviewed the data as listed CBC Latest Ref Rng & Units 12/14/2019 09/07/2019 06/15/2019  WBC 4.0 - 10.5 K/uL 6.8 8.9 5.7  Hemoglobin 12.0 - 15.0 g/dL 10.8(L) 11.6(L) 12.1  Hematocrit 36 - 46 % 35.3(L) 37.3 38.3  Platelets 150 - 400 K/uL 258 194 235     CMP Latest Ref Rng & Units 12/14/2019 09/07/2019 06/15/2019  Glucose 70 - 99 mg/dL 167(H) 143(H) 93  BUN 8 - 23 mg/dL 24(H) 27(H) 26(H)  Creatinine 0.44 - 1.00 mg/dL 1.35(H) 1.24(H) 0.97  Sodium 135 - 145 mmol/L 142 141 143  Potassium 3.5 - 5.1 mmol/L 3.9 3.7 3.7  Chloride 98 - 111 mmol/L 105 102 105  CO2 22 - 32 mmol/L '24 27 28  '$ Calcium 8.9 - 10.3 mg/dL 9.5 9.4 8.9  Total Protein 6.5 - 8.1 g/dL 7.4 7.4 7.3  Total Bilirubin 0.3 - 1.2 mg/dL 0.4 1.2 0.6  Alkaline Phos 38 - 126 U/L 90 69 86  AST 15 - 41 U/L '22 21 19  '$ ALT 0 - 44 U/L '9 14 11      '$ RADIOGRAPHIC STUDIES: I have personally reviewed the radiological images as listed and agreed with the findings in the report. No results found.   ASSESSMENT & PLAN:  Julie Julie [name] is a 86 y.o. female with    1. Malignant or new present of upper outer quadrant of left female breast, invasive  ductal carcinoma, grade 3, pT1cN0M0, stage IA, ER-/PR-/HER2- -She was diagnosed in 09/2015. She is s/p left breast lumpectomy, adjuvant Taxol/Abraxane and adjuvant radiation. -From a breast cancer she is clinically stable. Labs reviewed, CBC and CMP WNL except Hg 10.8, BG 167, BUN 24, Cr 1.35. Physical exam unremarkable. Her 11/2019 Mammogram normal. There is no clinical concern for recurrence. -I recommend she continue to drink adequately given her recent elevated Cr.  -Continue Surveillance.Next Mammogram in 11/2020.  -F/u in 4 months    2. MGUS  -10/2019labs showher M-protein and light chain levels have been overall stable  -No clinical concern for disease progression. Will continue follow up SPE/IFE, and light chain levels every 3-6 months. -Her 05/2019 M protein did increase to 0.9. Today's MM panel is still pending. Due to her newly onset anemia and renal insufficiency, I have some concern about disease progression.  I discussed if M-Protein continues to increase will recommend bone marrow biopsy.  -Her 09/07/19 Lumbar CT was negative  -Repeat lab with MM Panel in 2 months for close follow up    3.Joint and muscular pain, Rheumatoid arthritis,lumbar spondylosis -She will continue follow up with rheumatologist Dr. Dossie Der. -She has chronic low back pain from lumber spondylosis -Her chronic back pain continues to progress over the years and was recently exacerbated by increased activity in the past 3 months. Her CT Lumbar spine from 09/07/19 showed increased spinal stenosis of her lumbar spine.  -She is currently on Prednisone taper started 2 days ago (7/20).  4. Anemia, intermittent  -resolved today,we'll continue monitoring. -On Folic Acid -Her HG has  decreased intermittently, Hg 10.8 today (12/14/19).  -She notes recent endoscopy by Dr Julie Julie [name]. I will obtain report. Last colonoscopy was in 2012.  -I will check her iron panel with next labs. I recommend she start prenatal vitamin.    5.Peripheral neuropathy, G1-2 -No significant impact on her function, we'll continue to monitor closely. She understands that recovery from neuropathy is a slow process. -She has also triedacupuncture which helped. Continuetaking Lyricaand Vitamin B12. -overall stable.Ambulates withwalker -Not mentioned today   6. HTN -Continuemedications and continue to Follow with Dr. Maudie Mercury on this.   7. Short Term Memory loss -Ptpreviouslysharedconcern for memory loss but she deniedfurther medication to help. She previously declined clinical trial option. Practicingmental games and read and socialize to helpwas encouraged.  8. Osteoporosis -10/2017 DEXA shows osteoporosis with lowest T-score of -2.7 at Forearm radius. -She is athigh fracture risk due to her frequent falls. I previously discussed steroid will make osteoporosis worse, and I encouraged her to consider bisphosphonate with oral Fosamax or Prolia injection. Her and her daughter are agreeable to start after her back pain is controlled.  -She is currently on Vit D.    [place] endoscopy report from Dr Ulyses Amor office  -I will call her with MM panel results  -Lab in 2 months for close f/u  -may recommend bone marrow biopsy based on her MM lab today and next in 2 months  -Lab and F/u in 4 months, or sooner if needed     No problem-specific Assessment & Plan notes found for this encounter.   No orders of the defined types were placed in this encounter.  All questions were answered. The patient knows to call the clinic with any problems, questions or concerns. No barriers to learning was detected. The total time spent in the appointment was 30 minutes.     Julie Merle, MD 12/14/2019   I, Joslyn Devon, am acting as scribe for Julie Merle, MD.   I have reviewed the above documentation for accuracy and completeness, and I agree with the above.

## 2019-12-12 DIAGNOSIS — Z20822 Contact with and (suspected) exposure to covid-19: Secondary | ICD-10-CM | POA: Diagnosis not present

## 2019-12-12 DIAGNOSIS — M797 Fibromyalgia: Secondary | ICD-10-CM | POA: Diagnosis not present

## 2019-12-12 DIAGNOSIS — Z79899 Other long term (current) drug therapy: Secondary | ICD-10-CM | POA: Diagnosis not present

## 2019-12-12 DIAGNOSIS — M25562 Pain in left knee: Secondary | ICD-10-CM | POA: Diagnosis not present

## 2019-12-12 DIAGNOSIS — E119 Type 2 diabetes mellitus without complications: Secondary | ICD-10-CM | POA: Diagnosis not present

## 2019-12-12 DIAGNOSIS — E559 Vitamin D deficiency, unspecified: Secondary | ICD-10-CM | POA: Diagnosis not present

## 2019-12-12 DIAGNOSIS — M25561 Pain in right knee: Secondary | ICD-10-CM | POA: Diagnosis not present

## 2019-12-12 DIAGNOSIS — M129 Arthropathy, unspecified: Secondary | ICD-10-CM | POA: Diagnosis not present

## 2019-12-14 ENCOUNTER — Telehealth: Payer: Self-pay | Admitting: Neurology

## 2019-12-14 ENCOUNTER — Ambulatory Visit (INDEPENDENT_AMBULATORY_CARE_PROVIDER_SITE_OTHER): Payer: Medicare Other | Admitting: Neurology

## 2019-12-14 ENCOUNTER — Encounter: Payer: Self-pay | Admitting: Hematology

## 2019-12-14 ENCOUNTER — Other Ambulatory Visit: Payer: Self-pay

## 2019-12-14 ENCOUNTER — Telehealth: Payer: Self-pay | Admitting: Hematology

## 2019-12-14 ENCOUNTER — Inpatient Hospital Stay: Payer: Medicare Other | Attending: Hematology | Admitting: Hematology

## 2019-12-14 ENCOUNTER — Inpatient Hospital Stay: Payer: Medicare Other

## 2019-12-14 ENCOUNTER — Encounter: Payer: Self-pay | Admitting: Neurology

## 2019-12-14 VITALS — BP 137/76 | HR 59 | Ht 64.0 in | Wt 214.5 lb

## 2019-12-14 VITALS — BP 145/72 | HR 64 | Temp 97.8°F | Resp 18 | Ht 64.0 in | Wt 215.7 lb

## 2019-12-14 DIAGNOSIS — M549 Dorsalgia, unspecified: Secondary | ICD-10-CM | POA: Insufficient documentation

## 2019-12-14 DIAGNOSIS — M25561 Pain in right knee: Secondary | ICD-10-CM | POA: Diagnosis not present

## 2019-12-14 DIAGNOSIS — M47816 Spondylosis without myelopathy or radiculopathy, lumbar region: Secondary | ICD-10-CM | POA: Diagnosis not present

## 2019-12-14 DIAGNOSIS — R251 Tremor, unspecified: Secondary | ICD-10-CM | POA: Insufficient documentation

## 2019-12-14 DIAGNOSIS — Z171 Estrogen receptor negative status [ER-]: Secondary | ICD-10-CM | POA: Diagnosis not present

## 2019-12-14 DIAGNOSIS — R269 Unspecified abnormalities of gait and mobility: Secondary | ICD-10-CM | POA: Diagnosis not present

## 2019-12-14 DIAGNOSIS — R413 Other amnesia: Secondary | ICD-10-CM | POA: Insufficient documentation

## 2019-12-14 DIAGNOSIS — Z886 Allergy status to analgesic agent status: Secondary | ICD-10-CM | POA: Diagnosis not present

## 2019-12-14 DIAGNOSIS — Z923 Personal history of irradiation: Secondary | ICD-10-CM | POA: Insufficient documentation

## 2019-12-14 DIAGNOSIS — Z90721 Acquired absence of ovaries, unilateral: Secondary | ICD-10-CM | POA: Insufficient documentation

## 2019-12-14 DIAGNOSIS — C50212 Malignant neoplasm of upper-inner quadrant of left female breast: Secondary | ICD-10-CM | POA: Insufficient documentation

## 2019-12-14 DIAGNOSIS — I679 Cerebrovascular disease, unspecified: Secondary | ICD-10-CM | POA: Insufficient documentation

## 2019-12-14 DIAGNOSIS — C50411 Malignant neoplasm of upper-outer quadrant of right female breast: Secondary | ICD-10-CM

## 2019-12-14 DIAGNOSIS — N289 Disorder of kidney and ureter, unspecified: Secondary | ICD-10-CM | POA: Diagnosis not present

## 2019-12-14 DIAGNOSIS — R296 Repeated falls: Secondary | ICD-10-CM | POA: Diagnosis not present

## 2019-12-14 DIAGNOSIS — Z7952 Long term (current) use of systemic steroids: Secondary | ICD-10-CM | POA: Diagnosis not present

## 2019-12-14 DIAGNOSIS — Z8719 Personal history of other diseases of the digestive system: Secondary | ICD-10-CM | POA: Insufficient documentation

## 2019-12-14 DIAGNOSIS — M48061 Spinal stenosis, lumbar region without neurogenic claudication: Secondary | ICD-10-CM | POA: Insufficient documentation

## 2019-12-14 DIAGNOSIS — G8929 Other chronic pain: Secondary | ICD-10-CM | POA: Diagnosis not present

## 2019-12-14 DIAGNOSIS — Z885 Allergy status to narcotic agent status: Secondary | ICD-10-CM | POA: Insufficient documentation

## 2019-12-14 DIAGNOSIS — D472 Monoclonal gammopathy: Secondary | ICD-10-CM

## 2019-12-14 DIAGNOSIS — G3184 Mild cognitive impairment, so stated: Secondary | ICD-10-CM | POA: Diagnosis not present

## 2019-12-14 DIAGNOSIS — Z79899 Other long term (current) drug therapy: Secondary | ICD-10-CM | POA: Insufficient documentation

## 2019-12-14 DIAGNOSIS — C50412 Malignant neoplasm of upper-outer quadrant of left female breast: Secondary | ICD-10-CM

## 2019-12-14 LAB — CBC WITH DIFFERENTIAL/PLATELET
Abs Immature Granulocytes: 0.01 10*3/uL (ref 0.00–0.07)
Basophils Absolute: 0 10*3/uL (ref 0.0–0.1)
Basophils Relative: 0 %
Eosinophils Absolute: 0 10*3/uL (ref 0.0–0.5)
Eosinophils Relative: 0 %
HCT: 35.3 % — ABNORMAL LOW (ref 36.0–46.0)
Hemoglobin: 10.8 g/dL — ABNORMAL LOW (ref 12.0–15.0)
Immature Granulocytes: 0 %
Lymphocytes Relative: 7 %
Lymphs Abs: 0.5 10*3/uL — ABNORMAL LOW (ref 0.7–4.0)
MCH: 26.7 pg (ref 26.0–34.0)
MCHC: 30.6 g/dL (ref 30.0–36.0)
MCV: 87.4 fL (ref 80.0–100.0)
Monocytes Absolute: 0.4 10*3/uL (ref 0.1–1.0)
Monocytes Relative: 5 %
Neutro Abs: 5.9 10*3/uL (ref 1.7–7.7)
Neutrophils Relative %: 88 %
Platelets: 258 10*3/uL (ref 150–400)
RBC: 4.04 MIL/uL (ref 3.87–5.11)
RDW: 14.2 % (ref 11.5–15.5)
WBC: 6.8 10*3/uL (ref 4.0–10.5)
nRBC: 0 % (ref 0.0–0.2)

## 2019-12-14 LAB — COMPREHENSIVE METABOLIC PANEL
ALT: 9 U/L (ref 0–44)
AST: 22 U/L (ref 15–41)
Albumin: 3.5 g/dL (ref 3.5–5.0)
Alkaline Phosphatase: 90 U/L (ref 38–126)
Anion gap: 13 (ref 5–15)
BUN: 24 mg/dL — ABNORMAL HIGH (ref 8–23)
CO2: 24 mmol/L (ref 22–32)
Calcium: 9.5 mg/dL (ref 8.9–10.3)
Chloride: 105 mmol/L (ref 98–111)
Creatinine, Ser: 1.35 mg/dL — ABNORMAL HIGH (ref 0.44–1.00)
GFR calc Af Amer: 41 mL/min — ABNORMAL LOW (ref >60)
GFR calc non Af Amer: 36 mL/min — ABNORMAL LOW (ref >60)
Glucose, Bld: 167 mg/dL — ABNORMAL HIGH (ref 70–99)
Potassium: 3.9 mmol/L (ref 3.5–5.1)
Sodium: 142 mmol/L (ref 135–145)
Total Bilirubin: 0.4 mg/dL (ref 0.3–1.2)
Total Protein: 7.4 g/dL (ref 6.5–8.1)

## 2019-12-14 NOTE — Telephone Encounter (Signed)
Scheduled per 07/22 los, patient received printed calender.

## 2019-12-14 NOTE — Progress Notes (Signed)
PATIENT: Julie [name] DOB: [birth year]  Chief Complaint  Patient presents with  . New Patient (Initial Visit)    [facility] EMG room 4. Paper referral from Dr. [name] for PD/memory issues.  [name] PCP    [name], DO     HISTORICAL  Julie [name] is a 86 year old female, seen in request by her primary care physician Dr. [name], [place], for evaluation of bilateral hands and feet paresthesia, initial evaluation was on January 09, 2019.  I have reviewed and summarized the referring note from the referring physician.   Past medical history of hypertension, hyperlipidemia, left breast cancer, status post left lobectomy, chemotherapy, in 2017, during that time, she developed bilateral fingertips and feet paresthesia, also had a history of chronic low back pain, carries a diagnosis of lumbar stenosis, has gradual onset gait abnormality  Over the past 3 years, she continue to experience worsening bilateral fingertips and feet paresthesia, numbness tingling stay at the bottom of her feet, and toes, she also complains of worsening low back pain, radiating pain to bilateral lower extremities, last week while driving, she described difficulty moving her right leg off the paddle, she also complains of bilateral hands tremor, difficulty holding on small object  Hospital admission in July 2020 for generalized whole body achy pain, weakness, there was also described episode of staring spells at emergency room,  Echocardiogram December 14, 2018: Ejection fraction 60 to 65%, cavity size was normal, mild increased left ventricular wall thickness  MRI of the brain without contrast on March 13, 2018: No acute abnormality, moderate supratentorium small vessel disease  MRI of lumbar in January 2018, multilevel degenerative disc disease, mild to moderate canal stenosis at L2-3, L3-4, mild at L4-5, with variable degree of neural foraminal narrowing, severe neural foraminal narrowing at right L3-4, L4-5,  MRI  cervical spine September 2020: Multilevel degenerative changes, evidence of anterior cervical discectomy and fusion at C 4, 5, 6  Laboratory evaluation in 2020 showed normal TSH, CBC, CMP showed mild elevated glucose 115, normal CPK, negative HIV, RPR, A1c of 6.0, mild elevated M spike 0.8 DL on protein electrophoresis, normal B12, C-reactive protein,  EMG nerve conduction study in August 2020 showed no large fiber peripheral neuropathy  UPDATE December 14 2019: She was seen in 2020 for evaluation of intermittent bilateral upper and lower extremity paresthesia, chronic low back pain, gait abnormality, she was referred for physical therapy, which is helped her, but never carried through the water aerobic as suggested  She spent most of the time watching TV, different game show, not physically active, since 2020, she developed gradual onset more noticeable intermittent body shaking, also complains of generalized weakness, weak handgrip, using both hands to hold a cup,  She is currently under pain management, taking Lyrica, also oxycodone, she had recent flareup of her arthritis pain, was treated with low dose of steroid  She now has caregiver coming every other day, spent a few hours with her, she enjoying the trip to different shops,  She also has mild memory loss, word finding difficulties, denies family history of dementia, is on polypharmacy treatment, including oxycodone 5 mg 3 times a day, Lyrica 75 mg   REVIEW OF SYSTEMS: Full 14 system review of systems performed and notable only for as above All other review of systems were negative.  ALLERGIES: Allergies  Allergen Reactions  . Codeine Shortness Of Breath  . Other     Walnuts-anaphylaxis (patient denies allergy states that she just does  not eat WALNUTS)  . Aspirin Other (See Comments)    Duodenal ulcer- cannot take aspirin when active.  HYPERSENSITIVE  TO  REGULAR DOSE.      HOME MEDICATIONS: Current Outpatient Medications   Medication Sig Dispense Refill  . cholecalciferol (VITAMIN D3) 25 MCG (1000 UNIT) tablet Take 1,000 Units by mouth daily.    [name] docusate sodium (COLACE) 100 MG capsule Take 1 capsule (100 mg total) by mouth every 12 (twelve) hours. 60 capsule 0  . folic acid (FOLVITE) 1 MG tablet Take 2 mg by mouth every morning.     [name] FREESTYLE LITE test strip TEST ONCE EVERY DAY  4  . HYDROcodone-acetaminophen (NORCO/VICODIN) 5-325 MG tablet Take 1 tablet by mouth every 6 (six) hours as needed. 15 tablet 0  . hydroxychloroquine (PLAQUENIL) 200 MG tablet Take 200 mg by mouth daily.    [name] lovastatin (MEVACOR) 40 MG tablet 1 (ONE) TABLET TAKE IN THE EVENING AFTER DINNER (Patient taking differently: Take 40 mg by mouth every evening. ) 90 tablet 1  . methocarbamol (ROBAXIN) 500 MG tablet Take 1 tablet (500 mg total) by mouth every 8 (eight) hours as needed for muscle spasms. 15 tablet 0  . metoprolol succinate (TOPROL-XL) 50 MG 24 hr tablet Take 50 mg by mouth every evening.   2  . ondansetron (ZOFRAN ODT) 4 MG disintegrating tablet Take 1 tablet (4 mg total) by mouth every 6 (six) hours as needed for nausea or vomiting. 20 tablet 0  . pregabalin (LYRICA) 25 MG capsule Take 1 capsule (25 mg total) by mouth 3 (three) times daily. (Patient not taking: Reported on 03/01/2019) 90 capsule 0  . pregabalin (LYRICA) 75 MG capsule Take 75-150 mg by mouth See admin instructions. 75 Mg in the Am and 150 MG at bedtime    . valsartan-hydrochlorothiazide (DIOVAN-HCT) 320-12.5 MG tablet Take 0.5 tablets by mouth daily.     No current facility-administered medications for this visit.    PAST MEDICAL HISTORY: Past Medical History:  Diagnosis Date  . Anemia   . Anxiety    takes Klonopin at bedtime  . Arthritis    takes Methotrexate weekly and Prednisone  . Blood transfusion    no abnormal reaction noted  . Cancer (HCC)    breast   . Cataracts, bilateral    immature  . Complication of anesthesia    wakes up during surgery   . Depression   . Diabetes mellitus without complication ([place])   . Dyspnea    with exertion since chemo   . Fibromyalgia    takes Lyrica daily  . GERD (gastroesophageal reflux disease)    takes Nexium daily  . Glaucoma   . Hard of hearing   . Heart murmur   . Histoplasmosis   . History of colon polyps   . History of gout   . History of hiatal hernia   . History of shingles   . Hyperlipidemia   . Hypertension    takes Amlodipine,Metoprolol,and Losartan daily  . Incontinence of bowel   . Interstitial cystitis   . Joint pain   . Low back pain   . Nocturia   . Peripheral neuropathy   . Peripheral neuropathy   . Personal history of chemotherapy   . Personal history of radiation therapy   . PONV (postoperative nausea and vomiting)   . Ulcer 1970   duodenal  . Urinary frequency   . Urinary urgency   . Weakness    numbness in  both hands and feet    PAST SURGICAL HISTORY: Past Surgical History:  Procedure Laterality Date  . ABDOMINAL HYSTERECTOMY    . accupuncture  3 yrs ago  . ANKLE FUSION Left 05/10/2014   Procedure: LEFT ANKLE ARTHRODESIS ;  Surgeon: Wylene Simmer, MD;  Location: [facility];  Service: Orthopedics;  Laterality: Left;  . ANKLE SURGERY Left   . arm surgery Left    radius  . bladder tacked     . BREAST LUMPECTOMY Left 10/2015  . CERVICAL FUSION    . CHOLECYSTECTOMY    . COLONOSCOPY    . CYSTOCELE REPAIR    . DILATION AND CURETTAGE OF UTERUS    . ESOPHAGOGASTRODUODENOSCOPY    . PORT A CATH REVISION N/A 12/27/2015   Procedure: PORT A CATH REVISION;  Surgeon: Rolm Bookbinder, MD;  Location: [facility];  Service: General;  Laterality: N/A;  . PORT-A-CATH REMOVAL N/A 03/25/2016   Procedure: REMOVAL PORT-A-CATH;  Surgeon: Rolm Bookbinder, MD;  Location: [facility];  Service: General;  Laterality: N/A;  . PORTACATH PLACEMENT Right 12/27/2015   Procedure: INSERTION PORT-A-CATH WITH ULTRASOUND ;  Surgeon: Rolm Bookbinder, MD;  Location: [facility];  Service: General;  Laterality: Right;  . RADIOACTIVE SEED GUIDED PARTIAL MASTECTOMY WITH AXILLARY SENTINEL LYMPH NODE BIOPSY Left 11/14/2015   Procedure: RADIOACTIVE SEED GUIDED LEFT BREAST LUMPECTOMY WITH AXILLARY SENTINEL LYMPH NODE BIOPSY;  Surgeon: Rolm Bookbinder, MD;  Location: [facility];  Service: General;  Laterality: Left;  RADIOACTIVE SEED GUIDED LEFT BREAST LUMPECTOMY WITH AXILLARY SENTINEL LYMPH NODE BIOPSY  . SALPINGOOPHORECTOMY Bilateral   . TOTAL KNEE ARTHROPLASTY Right 02/28/2015   Procedure: RIGHT TOTAL KNEE ARTHROPLASTY;  Surgeon: Rod Can, MD;  Location: [facility];  Service: Orthopedics;  Laterality: Right;  . UPPER GASTROINTESTINAL ENDOSCOPY    . VAGINAL HYSTERECTOMY      FAMILY HISTORY: Family History  Problem Relation Age of Onset  . Colon cancer Sister 26  . Esophageal cancer Brother 54  . Breast cancer Mother   . Brain cancer Other   . Other Father        cerebral hemorrhage    SOCIAL HISTORY: Social History   Socioeconomic History  . Marital status: Divorced    Spouse name: Not on file  . Number of children: 5  . Years of education: some college  . Highest education level: Not on file  Occupational History  . Occupation: Retired  Tobacco Use  . Smoking status: Never Smoker  . Smokeless tobacco: Never Used  Vaping Use  . Vaping Use: Never used  Substance and Sexual Activity  . Alcohol use: No    Comment: IN [place] ONLY  . Drug use: No  . Sexual activity: Not on file  Other Topics Concern  . Not on file  Social History Narrative   Lives at home with her daughter.   Right-handed.   0.5 cup caffeine per day.   Social Determinants of Health   Financial Resource Strain:   . Difficulty of Paying Living Expenses:   Food Insecurity:   . Worried About Charity fundraiser in the Last Year:   . Arboriculturist in the Last Year:   Transportation Needs:   . Film/video editor (Medical):   [name] Lack of  Transportation (Non-Medical):   Physical Activity:   . Days of Exercise per Week:   . Minutes of Exercise per Session:   Stress:   . Feeling of Stress :  Social Connections:   . Frequency of Communication with Friends and Family:   . Frequency of Social Gatherings with Friends and Family:   . Attends Religious Services:   . Active Member of Clubs or Organizations:   . Attends Archivist Meetings:   [name] Marital Status:   Intimate Partner Violence:   . Fear of Current or Ex-Partner:   . Emotionally Abused:   [name] Physically Abused:   . Sexually Abused:      PHYSICAL EXAM  BP 137/76, HR 59, weight 214, 5'4  PHYSICAL EXAMNIATION:  Gen: NAD, conversant, well nourised, obese, well groomed                     Cardiovascular: Regular rate rhythm, no peripheral edema, warm, nontender. Eyes: Conjunctivae clear without exudates or hemorrhage Neck: Supple, no carotid bruits. Pulmonary: Clear to auscultation bilaterally   NEUROLOGICAL EXAM:  MENTAL STATUS: MMSE - Mini Mental State Exam 12/14/2019  Orientation to time 4  Orientation to Place 5  Registration 1  Attention/ Calculation 3  Recall 3  Language- name 2 objects 2  Language- repeat 1  Language- follow 3 step command 2  Language- read & follow direction 1  Write a sentence 1  Copy design 0  Total score 23     CRANIAL NERVES: CN II: Visual fields are full to confrontation  Pupils are round equal and briskly reactive to light. CN III, IV, VI: extraocular movement are normal. No ptosis. CN V: Facial sensation is intact to pinprick in all 3 divisions bilaterally. Corneal responses are intact.  CN VII: Face is symmetric with normal eye closure and smile. CN VIII: Hearing is normal to casual conversation bilaterally CN IX, X: Palate elevates symmetrically. Phonation is normal. CN XI: Head turning and shoulder shrug are intact CN XII: Tongue is midline with normal movements and no atrophy.  MOTOR: There is no  pronator drift of out-stretched arms. Muscle bulk and tone are normal. Muscle strength is normal.  REFLEXES: Reflexes are 1 and symmetric at the biceps, triceps, knees, and ankles. Plantar responses are flexor.  SENSORY: Length dependent decreased to light touch, and vibratory sensation to ankle level  COORDINATION: Rapid alternating movements and fine finger movements are intact. There is no dysmetria on finger-to-nose and heel-knee-shin.    GAIT/STANCE: She needs push-up to get up from seated position, antalgic, unsteady  DIAGNOSTIC DATA (LABS, IMAGING, TESTING) - I reviewed patient records, labs, notes, testing and imaging myself where available.   ASSESSMENT AND PLAN  Julie [name] is a 86 y.o. female   Mild cognitive impairment Gait abnormalities Cerebrovascular disease  Her complaints of memory loss are likely due to combination of aging, central nervous system degenerative disorder, with superimposed vascular component, medication side effect  No evidence of large fiber peripheral neuropathy on EMG nerve conduction study in 2020  MRI of brain showed significant supratentorium cerebrovascular disease  Complete evaluation with ultrasound of carotid artery  Aspirin 81 mg daily  Emphasized importance of moderate exercise  Referral home physical therapy, and water aerobic  Only return to clinic for new issues   Marcial Pacas, M.D. Ph.D.  [facility] [address] [place], [place] [address] Ph: [phone] Fax: [phone]  CC: [name], DO

## 2019-12-14 NOTE — Telephone Encounter (Signed)
Patient was referred for water aerobic by physical therapy following her evaluation with Manuela Schwartz last year in 2020, I wrote her a prescription, please help her figure out where to go for water aerobic

## 2019-12-15 LAB — KAPPA/LAMBDA LIGHT CHAINS
Kappa free light chain: 55.1 mg/L — ABNORMAL HIGH (ref 3.3–19.4)
Kappa, lambda light chain ratio: 3.58 — ABNORMAL HIGH (ref 0.26–1.65)
Lambda free light chains: 15.4 mg/L (ref 5.7–26.3)

## 2019-12-18 LAB — MULTIPLE MYELOMA PANEL, SERUM
Albumin SerPl Elph-Mcnc: 3.5 g/dL (ref 2.9–4.4)
Albumin/Glob SerPl: 1.1 (ref 0.7–1.7)
Alpha 1: 0.2 g/dL (ref 0.0–0.4)
Alpha2 Glob SerPl Elph-Mcnc: 0.7 g/dL (ref 0.4–1.0)
B-Globulin SerPl Elph-Mcnc: 1.6 g/dL — ABNORMAL HIGH (ref 0.7–1.3)
Gamma Glob SerPl Elph-Mcnc: 0.8 g/dL (ref 0.4–1.8)
Globulin, Total: 3.3 g/dL (ref 2.2–3.9)
IgA: 1077 mg/dL — ABNORMAL HIGH (ref 64–422)
IgG (Immunoglobin G), Serum: 855 mg/dL (ref 586–1602)
IgM (Immunoglobulin M), Srm: 44 mg/dL (ref 26–217)
M Protein SerPl Elph-Mcnc: 0.8 g/dL — ABNORMAL HIGH
Total Protein ELP: 6.8 g/dL (ref 6.0–8.5)

## 2019-12-19 ENCOUNTER — Telehealth: Payer: Self-pay

## 2019-12-19 ENCOUNTER — Ambulatory Visit (HOSPITAL_COMMUNITY): Payer: Medicare Other

## 2019-12-19 ENCOUNTER — Encounter (HOSPITAL_COMMUNITY): Payer: Medicare Other

## 2019-12-19 DIAGNOSIS — E119 Type 2 diabetes mellitus without complications: Secondary | ICD-10-CM | POA: Diagnosis not present

## 2019-12-19 DIAGNOSIS — Z961 Presence of intraocular lens: Secondary | ICD-10-CM | POA: Diagnosis not present

## 2019-12-19 DIAGNOSIS — Z79899 Other long term (current) drug therapy: Secondary | ICD-10-CM | POA: Diagnosis not present

## 2019-12-19 DIAGNOSIS — H524 Presbyopia: Secondary | ICD-10-CM | POA: Diagnosis not present

## 2019-12-19 NOTE — Telephone Encounter (Signed)
-----  Message from Truitt Merle, MD sent at 12/19/2019  7:59 AM EDT ----- Please let pt or her daughter know her MM lab are stable, will hold bone marrow biopsy for now,and f/u closely with lab (next in 2 months), thanks   Truitt Merle

## 2019-12-19 NOTE — Telephone Encounter (Signed)
Called and spoke with Ms. Jablon and notified her that her MM lab are stable and that Dr. Burr Medico will hold the bone marrow biopsy for now and that Dr. Burr Medico will follow closely with lab in the next 2 months. Ms. Yadao verbalized understanding.

## 2019-12-21 ENCOUNTER — Other Ambulatory Visit: Payer: Self-pay

## 2019-12-21 ENCOUNTER — Ambulatory Visit (HOSPITAL_COMMUNITY)
Admission: RE | Admit: 2019-12-21 | Discharge: 2019-12-21 | Disposition: A | Discharge status: Home / Self Care | Payer: Medicare Other | Source: Ambulatory Visit | Attending: Neurology | Admitting: Neurology

## 2019-12-21 DIAGNOSIS — R269 Unspecified abnormalities of gait and mobility: Secondary | ICD-10-CM | POA: Insufficient documentation

## 2019-12-21 DIAGNOSIS — G3184 Mild cognitive impairment, so stated: Secondary | ICD-10-CM | POA: Insufficient documentation

## 2019-12-21 DIAGNOSIS — E119 Type 2 diabetes mellitus without complications: Secondary | ICD-10-CM | POA: Diagnosis not present

## 2019-12-21 DIAGNOSIS — M797 Fibromyalgia: Secondary | ICD-10-CM | POA: Diagnosis not present

## 2019-12-21 DIAGNOSIS — Z79899 Other long term (current) drug therapy: Secondary | ICD-10-CM | POA: Diagnosis not present

## 2019-12-21 DIAGNOSIS — M545 Low back pain: Secondary | ICD-10-CM | POA: Diagnosis not present

## 2019-12-25 ENCOUNTER — Telehealth: Payer: Self-pay | Admitting: Neurology

## 2019-12-25 NOTE — Telephone Encounter (Signed)
I spoke to the patient's daughter, [name] (on [name]). She verbalized understanding of the results.

## 2019-12-25 NOTE — Telephone Encounter (Signed)
Summary: Right Carotid: Velocities in the right ICA are consistent with a 1-39% stenosis.  Left Carotid: Velocities in the left ICA are consistent with a 1-39% stenosis.  Vertebrals:  Bilateral vertebral arteries demonstrate antegrade flow. Subclavians: Normal flow hemodynamics were seen in bilateral subclavian              arteries. Please call patient, ultrasound of carotid arteries showed mild less than 39% stenosis of bilateral internal carotid artery, anterograde flow of bilateral vertebral artery

## 2020-01-04 NOTE — Telephone Encounter (Signed)
Called [facility] Julie [name] is out and she does the water aerobic . Julie [name] has had a energy and she is the only one that teaches this program . I have talked to patient as well

## 2020-01-10 ENCOUNTER — Telehealth: Payer: Self-pay

## 2020-01-10 NOTE — Telephone Encounter (Signed)
Ms Kushnir's daughter [name] called regarding her mother starting "bone strengthening" medication.

## 2020-01-16 ENCOUNTER — Encounter: Payer: Self-pay | Admitting: Physical Therapy

## 2020-01-16 ENCOUNTER — Other Ambulatory Visit: Payer: Self-pay

## 2020-01-16 ENCOUNTER — Ambulatory Visit: Payer: Medicare Other | Attending: Neurology | Admitting: Physical Therapy

## 2020-01-16 DIAGNOSIS — R2689 Other abnormalities of gait and mobility: Secondary | ICD-10-CM | POA: Insufficient documentation

## 2020-01-16 DIAGNOSIS — R2681 Unsteadiness on feet: Secondary | ICD-10-CM | POA: Insufficient documentation

## 2020-01-16 DIAGNOSIS — M6281 Muscle weakness (generalized): Secondary | ICD-10-CM | POA: Diagnosis not present

## 2020-01-16 NOTE — Therapy (Signed)
[facility] [address] [place], [name], [address] Phone: [phone]   Fax:  [phone]  Physical Therapy Evaluation  Patient Details  Name: Julie [name] MRN: [id number] Date of Birth: April 05, 1933 Referring Provider (PT): Dr. Marcial Pacas, MD   Encounter Date: 01/16/2020   PT End of Session - 01/16/20 2239    Visit Number 1    Number of Visits 9    Date for PT Re-Evaluation 03/15/20    Authorization Type Medicare    Authorization Time Period 01-16-20 - 04-16-20    PT Start Time 1320    PT Stop Time 1400    PT Time Calculation (min) 40 min    Activity Tolerance Patient tolerated treatment well    Behavior During Therapy [facility] for tasks assessed/performed           Past Medical History:  Diagnosis Date  . Anemia   . Anxiety    takes Klonopin at bedtime  . Arthritis    takes Methotrexate weekly and Prednisone  . Blood transfusion    no abnormal reaction noted  . Cancer (HCC)    breast   . Cataracts, bilateral    immature  . Complication of anesthesia    wakes up during surgery  . Depression   . Diabetes mellitus without complication ([place])   . Dyspnea    with exertion since chemo   . Fibromyalgia    takes Lyrica daily  . GERD (gastroesophageal reflux disease)    takes Nexium daily  . Glaucoma   . Hard of hearing   . Heart murmur   . Histoplasmosis   . History of colon polyps   . History of gout   . History of hiatal hernia   . History of shingles   . Hyperlipidemia   . Hypertension    takes Amlodipine,Metoprolol,and Losartan daily  . Incontinence of bowel   . Interstitial cystitis   . Joint pain   . Low back pain   . Nocturia   . Peripheral neuropathy   . Peripheral neuropathy   . Personal history of chemotherapy   . Personal history of radiation therapy   . PONV (postoperative nausea and vomiting)   . Ulcer 1970   duodenal  . Urinary frequency   . Urinary urgency   . Weakness     numbness in both hands and feet    Past Surgical History:  Procedure Laterality Date  . ABDOMINAL HYSTERECTOMY    . accupuncture  3 yrs ago  . ANKLE FUSION Left 05/10/2014   Procedure: LEFT ANKLE ARTHRODESIS ;  Surgeon: Wylene Simmer, MD;  Location: [facility];  Service: Orthopedics;  Laterality: Left;  . ANKLE SURGERY Left   . arm surgery Left    radius  . bladder tacked     . BREAST LUMPECTOMY Left 10/2015  . CERVICAL FUSION    . CHOLECYSTECTOMY    . COLONOSCOPY    . CYSTOCELE REPAIR    . DILATION AND CURETTAGE OF UTERUS    . ESOPHAGOGASTRODUODENOSCOPY    . PORT A CATH REVISION N/A 12/27/2015   Procedure: PORT A CATH REVISION;  Surgeon: Rolm Bookbinder, MD;  Location: [facility];  Service: General;  Laterality: N/A;  . PORT-A-CATH REMOVAL N/A 03/25/2016   Procedure: REMOVAL PORT-A-CATH;  Surgeon: Rolm Bookbinder, MD;  Location[facility];  Service: General;  Laterality: N/A;  . PORTACATH PLACEMENT Right 12/27/2015   Procedure: INSERTION PORT-A-CATH WITH ULTRASOUND ;  Surgeon:  Rolm Bookbinder, MD;  Location: [facility];  Service: General;  Laterality: Right;  . RADIOACTIVE SEED GUIDED PARTIAL MASTECTOMY WITH AXILLARY SENTINEL LYMPH NODE BIOPSY Left 11/14/2015   Procedure: RADIOACTIVE SEED GUIDED LEFT BREAST LUMPECTOMY WITH AXILLARY SENTINEL LYMPH NODE BIOPSY;  Surgeon: Rolm Bookbinder, MD;  Location: [facility];  Service: General;  Laterality: Left;  RADIOACTIVE SEED GUIDED LEFT BREAST LUMPECTOMY WITH AXILLARY SENTINEL LYMPH NODE BIOPSY  . SALPINGOOPHORECTOMY Bilateral   . TOTAL KNEE ARTHROPLASTY Right 02/28/2015   Procedure: RIGHT TOTAL KNEE ARTHROPLASTY;  Surgeon: Rod Can, MD;  Location[facility];  Service: Orthopedics;  Laterality: Right;  . UPPER GASTROINTESTINAL ENDOSCOPY    . VAGINAL HYSTERECTOMY      There were no vitals filed for this visit.    Subjective Assessment - 01/16/20 1329    Subjective Pt is interested in doing aquatic  therapy - has done aquatic therapy about a year ago at [facility]    Patient is accompained by: Family member   daughter   Pertinent History Peripheral neuropathy, OA knees, fibromyalgia, h/o Lt breast cancer    Patient Stated Goals improve strength and balance with aquatic therapy    Currently in Pain? Yes    Pain Score 5     Pain Location --   generalized pain   Pain Orientation Right   generalized   Pain Descriptors / Indicators Aching;Burning;Discomfort    Pain Type Chronic pain    Pain Onset More than a month ago    Pain Frequency Constant              [facility] PT Assessment - 01/16/20 1333      Assessment   Medical Diagnosis Gait abnormality    Referring Provider (PT) Dr. Marcial Pacas, MD    Onset Date/Surgical Date --   August 2020   Prior Therapy pt received PT at this facility from Oct. 2020 - Jan. 2021      Precautions   Precautions Fall      Restrictions   Weight Bearing Restrictions No      Balance Screen   Has the patient fallen in the past 6 months No    Has the patient had a decrease in activity level because of a fear of falling?  No    Is the patient reluctant to leave their home because of a fear of falling?  No      Home Ecologist residence    Home Access Stairs to enter    Entrance Stairs-Number of Steps 3    Entrance Stairs-Rails Cannot reach both      Prior Function   Level of Independence Independent      ROM / Strength   AROM / PROM / Strength Strength      Strength   Overall Strength Within functional limits for tasks performed      Transfers   Transfers Sit to Stand;Stand to Sit    Five time sit to stand comments  23.31 without UE support       Ambulation/Gait   Ambulation/Gait Yes    Ambulation/Gait Assistance 5: Supervision    Ambulation Distance (Feet) 75 Feet    Assistive device Straight cane    Gait Pattern Step-through pattern    Ambulation Surface Level;Indoor    Gait velocity 19.19 secs =  1.71 ft/sec     Gait Comments pt states she is using the rollator in her house  Standardized Balance Assessment   Standardized Balance Assessment Timed Up and Go Test      Timed Up and Go Test   Normal TUG (seconds) 19.44   with SPC                     Objective measurements completed on examination: See above findings.               PT Education - 01/16/20 2238    Education Details instructions for aquatic therapy at [facility]) Educated Patient;Child(ren)    Methods Explanation;Demonstration;Handout    Comprehension Verbalized understanding            PT Short Term Goals - 01/16/20 2248      PT SHORT TERM GOAL #1   Title Pt will report pain intensity </= 3/10 after completion of aquatic therapy session.    Baseline 5/10    Time 4    Period Weeks    Status New    Target Date 02/16/20      PT SHORT TERM GOAL #2   Title Initiate HEP of aquatic exercises to be continued upon D/C from PT.    Time 4    Period Weeks    Status New    Target Date 02/16/20      PT SHORT TERM GOAL #3   Title Pt will amb. 44m across pool with CGA with ability to recover LOB.    Time 4    Period Weeks    Status New    Target Date 02/16/20             PT Long Term Goals - 01/16/20 2251      PT LONG TERM GOAL #1   Title Pt will be independent in aquatic exercise program to be continued upon D/C from PT.    Time 8    Period Weeks    Status New    Target Date 03/15/20      PT LONG TERM GOAL #2   Title Pt will amb. 28m x 2 reps without rest break with SBA with ability to recover LOB without UE support.    Time 8    Period Weeks    Status New    Target Date 03/15/20      PT LONG TERM GOAL #3   Title Pt will report reduced generalized pain to </= 2/10 after aquatic therapy session.    Time 8    Period Weeks    Status New    Target Date 03/15/20                  Plan - 01/16/20 2240    Clinical Impression Statement Pt presents with  gait instability due to OA in knees, decreased standing balance and impaired sensation due to peripheral neuropathy.  Pt is at fall risk per TUG score 19.44 secs.  Pt will benefit from aquatic therapy for strengthening, balance and gait training with use of buoyancy for spinal decompression and joint offloading for reduced pain with mobility.    Personal Factors and Comorbidities Comorbidity 2;Fitness;Past/Current Experience;Time since onset of injury/illness/exacerbation;Transportation    Comorbidities fibromyalgia, peripheral neuropathy, h/o Lt breast cancer, OA knees    Examination-Activity Limitations Locomotion Level;Squat;Stairs;Bend;Stand    Examination-Participation Restrictions Meal Prep;Cleaning;Shop;Community Activity;Laundry    Stability/Clinical Decision Making Evolving/Moderate complexity    Rehab Potential Good    PT Frequency 1x / week    PT Duration 8 weeks  PT Treatment/Interventions Aquatic Therapy    PT Next Visit Plan aquatic therapy    Consulted and Agree with Plan of Care Patient;Family member/caregiver    Family Member Consulted daughter [name]           Patient will benefit from skilled therapeutic intervention in order to improve the following deficits and impairments:  Difficulty walking, Decreased balance, Decreased strength, Pain, Impaired sensation  Visit Diagnosis: Other abnormalities of gait and mobility - Plan: PT plan of care cert/re-cert  Muscle weakness (generalized) - Plan: PT plan of care cert/re-cert  Unsteadiness on feet - Plan: PT plan of care cert/re-cert     Problem List Patient Active Problem List   Diagnosis Date Noted  . Mild cognitive impairment 12/14/2019  . Cerebral vascular disease 12/14/2019  . Gait abnormality 01/09/2019  . Paresthesia 01/09/2019  . Near syncope   . Weakness   . Essential hypertension   . Generalized weakness 12/13/2018  . Syncope 12/13/2018  . Peripheral neuropathy 06/08/2016  . Low back pain without  sciatica 06/08/2016  . Abnormality of gait 06/08/2016  . Anemia due to antineoplastic chemotherapy 03/06/2016  . Hypersensitivity reaction 01/07/2016  . Port catheter in place 01/03/2016  . Malignant neoplasm of upper outer quadrant of female breast ([place]) 10/30/2015  . Primary osteoarthritis of right knee 02/28/2015  . Post-traumatic arthritis of ankle 05/10/2014  . MGUS (monoclonal gammopathy of unknown significance) 02/17/2012  . Diverticulosis of colon (without mention of hemorrhage) 01/21/2011  . Family history of malignant neoplasm of gastrointestinal tract 01/21/2011  . Special screening for malignant neoplasms, colon 01/21/2011  . Other symptoms involving digestive system(787.99) 01/21/2011    Genevieve Ritzel, [place], PT 01/16/2020, 10:57 PM  [facility] [address] [place], [name], [address] Phone: [phone]   Fax:  [phone]  Name: MEMPHIS CRESWELL MRN: [id number] Date of Birth: May 12, 1933

## 2020-01-16 NOTE — Patient Instructions (Signed)
°  Aquatic Therapy: What to Expect! ° °Where:  °[facility] °[address] °[place], [place]  [address] °[phone] ° °NOTE:  You will receive an automated phone message reminding you of your appointment and it will say the appointment is at the Rehab [facility] on [address]  We are working to fix this- just know that you will meet us at the pool! ° °How to Prepare: °• Please make sure you drink 8 ounces of water about one hour prior to your pool session °• A caregiver MUST attend the entire session with the patient.  The caregiver will be responsible for assisting with dressing as well as any toileting needs.  If the patient will be doing a home program this should likely be the person who will assist as well.  °• Patients must wear either their street shoes or pool shoes until they are ready to enter the pool with the therapist.  Patients must also wear either street shoes or pool shoes once exiting the pool to walk to the locker room.  This will helps us prevent slips and falls.  °• Please arrive 15 minutes early to prepare for your pool therapy session °• Sign in at the front desk on the clipboard marked for [place] °• You may use the locker rooms on your right and then enter directly into the recreation pool (NOT the competition pool) °• Please make sure to attend to any toileting needs prior to entering the pool °• Please be dressed in your swim suit and on the pool deck at least 5 minutes before your appointment °• Once on the pool deck your therapist will ask you to sign the Patient  Consent and Assignment of Benefits form °• Your therapist may take your blood pressure prior to, during and after your session if indicated ° °About the pool  and parking: °1. Entering the pool °Your therapist will assist you; there are multiple ways to enter including stairs with railings, a walk in ramp, a roll in chair and a mechanical lift. Your therapist will determine the most appropriate way for  you. °2. Water temperature is usually between 86-87 degrees °3. There may be other swimmers in the pool at the same time °4. Parking is free: if you arrive and there is a parking attendant please inform them you are there for therapy and do not pay to park. Handicapped parking is located at the front entrance. ° °Contact Info:     Appointments: °[facility]  All sessions are 45 minutes   °[address]  [address]     Please call the  Ne[facility] if   °[place], [place]   [address]    you need to cancel or reschedule an appointment.  °[phone]     ° ° ° ° °

## 2020-01-17 ENCOUNTER — Ambulatory Visit: Payer: Medicare Other | Admitting: Physical Therapy

## 2020-01-17 DIAGNOSIS — R2689 Other abnormalities of gait and mobility: Secondary | ICD-10-CM | POA: Diagnosis not present

## 2020-01-17 DIAGNOSIS — R2681 Unsteadiness on feet: Secondary | ICD-10-CM

## 2020-01-17 DIAGNOSIS — M6281 Muscle weakness (generalized): Secondary | ICD-10-CM | POA: Diagnosis not present

## 2020-01-18 ENCOUNTER — Encounter: Payer: Self-pay | Admitting: Physical Therapy

## 2020-01-18 NOTE — Therapy (Signed)
[facility] [address] [place], [name], [address] Phone: [phone]   Fax:  [phone]  Physical Therapy Treatment  Patient Details  Name: Julie [name] MRN: [id number] Date of Birth: 07-01-1932 Referring Provider (PT): Dr. Marcial Pacas, MD   Encounter Date: 01/17/2020   PT End of Session - 01/18/20 1355    Visit Number 2    Number of Visits 9    Date for PT Re-Evaluation 03/15/20    Authorization Type Medicare    Authorization Time Period 01-16-20 - 04-16-20    PT Start Time 1545    PT Stop Time 1630    PT Time Calculation (min) 45 min    Equipment Utilized During Treatment --   pool bar bells, pool noodle   Activity Tolerance Patient tolerated treatment well    Behavior During Therapy [facility] for tasks assessed/performed           Past Medical History:  Diagnosis Date  . Anemia   . Anxiety    takes Klonopin at bedtime  . Arthritis    takes Methotrexate weekly and Prednisone  . Blood transfusion    no abnormal reaction noted  . Cancer (HCC)    breast   . Cataracts, bilateral    immature  . Complication of anesthesia    wakes up during surgery  . Depression   . Diabetes mellitus without complication ([place])   . Dyspnea    with exertion since chemo   . Fibromyalgia    takes Lyrica daily  . GERD (gastroesophageal reflux disease)    takes Nexium daily  . Glaucoma   . Hard of hearing   . Heart murmur   . Histoplasmosis   . History of colon polyps   . History of gout   . History of hiatal hernia   . History of shingles   . Hyperlipidemia   . Hypertension    takes Amlodipine,Metoprolol,and Losartan daily  . Incontinence of bowel   . Interstitial cystitis   . Joint pain   . Low back pain   . Nocturia   . Peripheral neuropathy   . Peripheral neuropathy   . Personal history of chemotherapy   . Personal history of radiation therapy   . PONV (postoperative nausea and vomiting)   . Ulcer 1970    duodenal  . Urinary frequency   . Urinary urgency   . Weakness    numbness in both hands and feet    Past Surgical History:  Procedure Laterality Date  . ABDOMINAL HYSTERECTOMY    . accupuncture  3 yrs ago  . ANKLE FUSION Left 05/10/2014   Procedure: LEFT ANKLE ARTHRODESIS ;  Surgeon: Wylene Simmer, MD;  Location: [facility];  Service: Orthopedics;  Laterality: Left;  . ANKLE SURGERY Left   . arm surgery Left    radius  . bladder tacked     . BREAST LUMPECTOMY Left 10/2015  . CERVICAL FUSION    . CHOLECYSTECTOMY    . COLONOSCOPY    . CYSTOCELE REPAIR    . DILATION AND CURETTAGE OF UTERUS    . ESOPHAGOGASTRODUODENOSCOPY    . PORT A CATH REVISION N/A 12/27/2015   Procedure: PORT A CATH REVISION;  Surgeon: Rolm Bookbinder, MD;  Location: [facility];  Service: General;  Laterality: N/A;  . PORT-A-CATH REMOVAL N/A 03/25/2016   Procedure: REMOVAL PORT-A-CATH;  Surgeon: Rolm Bookbinder, MD;  Location[facility];  Service: General;  Laterality: N/A;  .  PORTACATH PLACEMENT Right 12/27/2015   Procedure: INSERTION PORT-A-CATH WITH ULTRASOUND ;  Surgeon: Rolm Bookbinder, MD;  Location: [facility];  Service: General;  Laterality: Right;  . RADIOACTIVE SEED GUIDED PARTIAL MASTECTOMY WITH AXILLARY SENTINEL LYMPH NODE BIOPSY Left 11/14/2015   Procedure: RADIOACTIVE SEED GUIDED LEFT BREAST LUMPECTOMY WITH AXILLARY SENTINEL LYMPH NODE BIOPSY;  Surgeon: Rolm Bookbinder, MD;  Location: [facility];  Service: General;  Laterality: Left;  RADIOACTIVE SEED GUIDED LEFT BREAST LUMPECTOMY WITH AXILLARY SENTINEL LYMPH NODE BIOPSY  . SALPINGOOPHORECTOMY Bilateral   . TOTAL KNEE ARTHROPLASTY Right 02/28/2015   Procedure: RIGHT TOTAL KNEE ARTHROPLASTY;  Surgeon: Rod Can, MD;  Location[facility];  Service: Orthopedics;  Laterality: Right;  . UPPER GASTROINTESTINAL ENDOSCOPY    . VAGINAL HYSTERECTOMY      There were no vitals filed for this visit.   Subjective  Assessment - 01/18/20 1350    Subjective Pt presents for aquatic therapy at [facility] - accompanied by her aide, Hoyle Sauer; pt using a rollator for assistance with ambulation    Patient is accompained by: --   aide Hoyle Sauer   Pertinent History Peripheral neuropathy, OA knees, fibromyalgia, h/o Lt breast cancer    Patient Stated Goals improve strength and balance with aquatic therapy    Currently in Pain? Yes    Pain Score 5     Pain Location --   generalized pain in body   Pain Orientation --   "all over body"   Pain Descriptors / Indicators Aching;Burning;Discomfort    Pain Type Chronic pain    Pain Onset More than a month ago    Pain Frequency Constant    Aggravating Factors  no specific factors    Pain Relieving Factors pain meds help          Aquatic therapy at [facility]    Patient seen for aquatic therapy today.  Treatment took place in water 3.5-4 feet deep depending upon activity.  Pt entered and exited the pool via ramp negotiation with use of hand rails for support.  Pt gait trained in pool - 51m x 4 reps forward for warm up exercise  Sidestepping 65m x 1 rep; sidestepping with small squats 38m x 1 rep  Pt amb. Forward across pool using noodle with pushing forward/pulling back for increased resistance for UE strengthening, incr. Cardiovascular functioning and for incr. Challenge with balance;  Pt also used bar bells reciprocally during gait training of 82m  X 2 reps with these  Pt performed marching in place 10 reps each; progressed to using bar bells contralaterally for balance and coordination  Cross country skiing in place - 10 reps each - with bar bells held in each hand   Pt performed supine exercise - hip abduction/adduction 15m x 2 reps - supported by PT; bicycling LE's in supine position -- Neck doodle used for head support and use of pool noodle under arms with PT supporting pt in supine position   Simulated leg press with bil. Feet against pool wall - 10 reps; for hip  extension strengthening   Squats 10 reps with bil. UE support on wall  Pt requires buoyancy of water for off loading of joints for reduced pain and spinal decompression for reduced pain with weight bearing exercises; viscosity of water Needed for resistance for strengthening.  Current of water provides perturbations for challenges with balance.  PT Short Term Goals - 01/18/20 1400      PT SHORT TERM GOAL #1   Title Pt will report pain intensity </= 3/10 after completion of aquatic therapy session.    Baseline 5/10    Time 4    Period Weeks    Status New    Target Date 02/16/20      PT SHORT TERM GOAL #2   Title Initiate HEP of aquatic exercises to be continued upon D/C from PT.    Time 4    Period Weeks    Status New    Target Date 02/16/20      PT SHORT TERM GOAL #3   Title Pt will amb. 7m across pool with CGA with ability to recover LOB.    Time 4    Period Weeks    Status New    Target Date 02/16/20             PT Long Term Goals - 01/18/20 1400      PT LONG TERM GOAL #1   Title Pt will be independent in aquatic exercise program to be continued upon D/C from PT.    Time 8    Period Weeks    Status New      PT LONG TERM GOAL #2   Title Pt will amb. 28m x 2 reps without rest break with SBA with ability to recover LOB without UE support.    Time 8    Period Weeks    Status New      PT LONG TERM GOAL #3   Title Pt will report reduced generalized pain to </= 2/10 after aquatic therapy session.    Time 8    Period Weeks    Status New                 Plan - 01/18/20 1356    Clinical Impression Statement Pt tolerated aquatic exercises well with minimal c/o fatigue during session.  Pt had some difficulty initially maintaining balance with Ai Chi posture (soothing), indicative of decreased trunk control, however, improved with practice and repetition.  Pt did have some difficulty with balance activities  requiring SLS on each leg.  Pt will benefit from continued aquatic therapy to address LE weakness, gait and balance deficits.    Personal Factors and Comorbidities Comorbidity 2;Fitness;Past/Current Experience;Time since onset of injury/illness/exacerbation;Transportation    Comorbidities fibromyalgia, peripheral neuropathy, h/o Lt breast cancer, OA knees    Examination-Activity Limitations Locomotion Level;Squat;Stairs;Bend;Stand    Examination-Participation Restrictions Meal Prep;Cleaning;Shop;Community Activity;Laundry    Stability/Clinical Decision Making Evolving/Moderate complexity    Rehab Potential Good    PT Frequency 1x / week   2x/week for 1st week - eval + aquatic session   PT Duration 8 weeks    PT Treatment/Interventions Aquatic Therapy    PT Next Visit Plan aquatic therapy    Consulted and Agree with Plan of Care Patient;Family member/caregiver    Family Member Consulted daughter [name]           Patient will benefit from skilled therapeutic intervention in order to improve the following deficits and impairments:  Difficulty walking, Decreased balance, Decreased strength, Pain, Impaired sensation  Visit Diagnosis: Other abnormalities of gait and mobility  Muscle weakness (generalized)  Unsteadiness on feet     Problem List Patient Active Problem List   Diagnosis Date Noted  . Mild cognitive impairment 12/14/2019  . Cerebral vascular disease 12/14/2019  . Gait abnormality 01/09/2019  . Paresthesia  01/09/2019  . Near syncope   . Weakness   . Essential hypertension   . Generalized weakness 12/13/2018  . Syncope 12/13/2018  . Peripheral neuropathy 06/08/2016  . Low back pain without sciatica 06/08/2016  . Abnormality of gait 06/08/2016  . Anemia due to antineoplastic chemotherapy 03/06/2016  . Hypersensitivity reaction 01/07/2016  . Port catheter in place 01/03/2016  . Malignant neoplasm of upper outer quadrant of female breast ([place]) 10/30/2015  . Primary  osteoarthritis of right knee 02/28/2015  . Post-traumatic arthritis of ankle 05/10/2014  . MGUS (monoclonal gammopathy of unknown significance) 02/17/2012  . Diverticulosis of colon (without mention of hemorrhage) 01/21/2011  . Family history of malignant neoplasm of gastrointestinal tract 01/21/2011  . Special screening for malignant neoplasms, colon 01/21/2011  . Other symptoms involving digestive system(787.99) 01/21/2011    Dasani Crear, [place], [place], [place] 01/18/2020, 2:01 PM  [facility] [address] [place], [name], [address] Phone: [phone]   Fax:  [phone]  Name: Julie [name] MRN: [id number] Date of Birth: 10-Sep-1932

## 2020-01-22 ENCOUNTER — Ambulatory Visit: Payer: Medicare Other | Admitting: Physical Therapy

## 2020-01-22 ENCOUNTER — Other Ambulatory Visit: Payer: Self-pay | Admitting: Hematology

## 2020-01-22 ENCOUNTER — Other Ambulatory Visit: Payer: Self-pay

## 2020-01-22 DIAGNOSIS — M6281 Muscle weakness (generalized): Secondary | ICD-10-CM

## 2020-01-22 DIAGNOSIS — R2689 Other abnormalities of gait and mobility: Secondary | ICD-10-CM | POA: Diagnosis not present

## 2020-01-22 DIAGNOSIS — R2681 Unsteadiness on feet: Secondary | ICD-10-CM

## 2020-01-23 ENCOUNTER — Encounter: Payer: Self-pay | Admitting: Physical Therapy

## 2020-01-23 ENCOUNTER — Telehealth: Payer: Self-pay

## 2020-01-23 ENCOUNTER — Other Ambulatory Visit: Payer: Self-pay | Admitting: Hematology

## 2020-01-23 NOTE — Telephone Encounter (Signed)
-----   Message from Truitt Merle, MD sent at 01/23/2020  3:39 PM EDT ----- I told Julie [name] yesterday, but she may have not called her yet. Could you call her back and let her know that I recommend Prolia injection every 6 months? Due to her CKD, the injection is safer than oral or iv infusion bisphosphonate. Please schedule her first injection any time in next few weeks. Order was placed yesterday, so PA is probably still pending now.  Krista Blue   ----- Message ----- From: Kennedy Bucker, LPN Sent: 2/55/2589  12:05 PM EDT To: Truitt Merle, MD  Hello Dr Burr Medico, Patient's daughter [name] called about patient being put on medication to strengthen her bones as you have previously discussed. Thank you.  Kim LPN

## 2020-01-23 NOTE — Telephone Encounter (Signed)
Called patient's daughter to give name of medication. Advised her that scheduling will reach out to her with appointment information. She verbalized understanding.

## 2020-01-23 NOTE — Therapy (Addendum)
[facility] [address] [place], [name], [address] Phone: [phone]   Fax:  [phone]  Physical Therapy Treatment  Patient Details  Name: Julie [name] MRN: [id number] Date of Birth: 1932/06/10 Referring Provider (PT): Dr. Marcial Pacas, MD   Encounter Date: 01/22/2020   PT End of Session - 01/23/20 1904    Visit Number 3    Number of Visits 9    Date for PT Re-Evaluation 03/15/20    Authorization Type Medicare    Authorization Time Period 01-16-20 - 04-16-20    PT Start Time 1545    PT Stop Time 1630    PT Time Calculation (min) 45 min    Equipment Utilized During Treatment --   pool bar bells, pool noodle   Activity Tolerance Patient tolerated treatment well    Behavior During Therapy [facility] for tasks assessed/performed           Past Medical History:  Diagnosis Date  . Anemia   . Anxiety    takes Klonopin at bedtime  . Arthritis    takes Methotrexate weekly and Prednisone  . Blood transfusion    no abnormal reaction noted  . Cancer (HCC)    breast   . Cataracts, bilateral    immature  . Complication of anesthesia    wakes up during surgery  . Depression   . Diabetes mellitus without complication ([place])   . Dyspnea    with exertion since chemo   . Fibromyalgia    takes Lyrica daily  . GERD (gastroesophageal reflux disease)    takes Nexium daily  . Glaucoma   . Hard of hearing   . Heart murmur   . Histoplasmosis   . History of colon polyps   . History of gout   . History of hiatal hernia   . History of shingles   . Hyperlipidemia   . Hypertension    takes Amlodipine,Metoprolol,and Losartan daily  . Incontinence of bowel   . Interstitial cystitis   . Joint pain   . Low back pain   . Nocturia   . Peripheral neuropathy   . Peripheral neuropathy   . Personal history of chemotherapy   . Personal history of radiation therapy   . PONV (postoperative nausea and vomiting)   . Ulcer 1970    duodenal  . Urinary frequency   . Urinary urgency   . Weakness    numbness in both hands and feet    Past Surgical History:  Procedure Laterality Date  . ABDOMINAL HYSTERECTOMY    . accupuncture  3 yrs ago  . ANKLE FUSION Left 05/10/2014   Procedure: LEFT ANKLE ARTHRODESIS ;  Surgeon: Wylene Simmer, MD;  Location: [facility];  Service: Orthopedics;  Laterality: Left;  . ANKLE SURGERY Left   . arm surgery Left    radius  . bladder tacked     . BREAST LUMPECTOMY Left 10/2015  . CERVICAL FUSION    . CHOLECYSTECTOMY    . COLONOSCOPY    . CYSTOCELE REPAIR    . DILATION AND CURETTAGE OF UTERUS    . ESOPHAGOGASTRODUODENOSCOPY    . PORT A CATH REVISION N/A 12/27/2015   Procedure: PORT A CATH REVISION;  Surgeon: Rolm Bookbinder, MD;  Location: [facility];  Service: General;  Laterality: N/A;  . PORT-A-CATH REMOVAL N/A 03/25/2016   Procedure: REMOVAL PORT-A-CATH;  Surgeon: Rolm Bookbinder, MD;  Location: [facility];  Service: General;  Laterality: N/A;  .  PORTACATH PLACEMENT Right 12/27/2015   Procedure: INSERTION PORT-A-CATH WITH ULTRASOUND ;  Surgeon: Rolm Bookbinder, MD;  Location: [facility];  Service: General;  Laterality: Right;  . RADIOACTIVE SEED GUIDED PARTIAL MASTECTOMY WITH AXILLARY SENTINEL LYMPH NODE BIOPSY Left 11/14/2015   Procedure: RADIOACTIVE SEED GUIDED LEFT BREAST LUMPECTOMY WITH AXILLARY SENTINEL LYMPH NODE BIOPSY;  Surgeon: Rolm Bookbinder, MD;  Location: [facility];  Service: General;  Laterality: Left;  RADIOACTIVE SEED GUIDED LEFT BREAST LUMPECTOMY WITH AXILLARY SENTINEL LYMPH NODE BIOPSY  . SALPINGOOPHORECTOMY Bilateral   . TOTAL KNEE ARTHROPLASTY Right 02/28/2015   Procedure: RIGHT TOTAL KNEE ARTHROPLASTY;  Surgeon: Rod Can, MD;  Location: [facility];  Service: Orthopedics;  Laterality: Right;  . UPPER GASTROINTESTINAL ENDOSCOPY    . VAGINAL HYSTERECTOMY      There were no vitals filed for this visit.   Subjective  Assessment - 01/23/20 1904    Subjective Denies changes or falls.    Patient is accompained by: --   aide Hoyle Sauer   Pertinent History Peripheral neuropathy, OA knees, fibromyalgia, h/o Lt breast cancer    Patient Stated Goals improve strength and balance with aquatic therapy    Currently in Pain? No/denies    Pain Onset More than a month ago           Aquatic therapy at [facility]    Patient seen for aquatic therapy today.  Treatment took place in water 3.5-4 feet deep depending upon activity.  Pt entered and exited the pool via ramp negotiation with use of hand rails for support.  Pt gait trained in pool - 28m x 4 reps forward for warm up exercise  Sidestepping 31m x 1 rep; sidestepping with squats 87m x 1 rep  Pt amb. Forward across pool using bar bells with pushing forward/pulling back for increased resistance for UE strengthening, incr. Cardiovascular functioning and for incr. Challenge with balance;  Performed 27m x 6 total.  Had pt hold barbells under water for 4 of those reps.  Pt performed marching in place 10 reps each; progressed to using bar bells contralaterally for balance and coordination  Squats 10 reps with bil. UE support on wall  Performed bil LE hip flexion, hip extension, hip abd, hip add, heel raises-all x 15 reps  Ai Chi posture of soothing and gathering x 10 reps.  Pt requires buoyancy of water for off loading of joints for reduced pain and spinal decompression for reduced pain with weight bearing exercises; viscosity of water Needed for resistance for strengthening.  Current of water provides perturbations for challenges with balance.  Needs cues through out for technique.     PT Short Term Goals - 01/18/20 1400      PT SHORT TERM GOAL #1   Title Pt will report pain intensity </= 3/10 after completion of aquatic therapy session.    Baseline 5/10    Time 4    Period Weeks    Status New    Target Date 02/16/20      PT SHORT TERM GOAL #2   Title  Initiate HEP of aquatic exercises to be continued upon D/C from PT.    Time 4    Period Weeks    Status New    Target Date 02/16/20      PT SHORT TERM GOAL #3   Title Pt will amb. 45m across pool with CGA with ability to recover LOB.    Time 4    Period Weeks  Status New    Target Date 02/16/20             PT Long Term Goals - 01/18/20 1400      PT LONG TERM GOAL #1   Title Pt will be independent in aquatic exercise program to be continued upon D/C from PT.    Time 8    Period Weeks    Status New      PT LONG TERM GOAL #2   Title Pt will amb. 49m x 2 reps without rest break with SBA with ability to recover LOB without UE support.    Time 8    Period Weeks    Status New      PT LONG TERM GOAL #3   Title Pt will report reduced generalized pain to </= 2/10 after aquatic therapy session.    Time 8    Period Weeks    Status New                 Plan - 01/23/20 1904    Clinical Impression Statement Pt tolerated session well with good endurance.  Needs frequent cues to stay on task and perform correctly.  Cont per poc.    Personal Factors and Comorbidities Comorbidity 2;Fitness;Past/Current Experience;Time since onset of injury/illness/exacerbation;Transportation    Comorbidities fibromyalgia, peripheral neuropathy, h/o Lt breast cancer, OA knees    Examination-Activity Limitations Locomotion Level;Squat;Stairs;Bend;Stand    Examination-Participation Restrictions Meal Prep;Cleaning;Shop;Community Activity;Laundry    Stability/Clinical Decision Making Evolving/Moderate complexity    Rehab Potential Good    PT Frequency 1x / week   2x/week for 1st week - eval + aquatic session   PT Duration 8 weeks    PT Treatment/Interventions Aquatic Therapy    PT Next Visit Plan aquatic therapy    Consulted and Agree with Plan of Care Patient;Family member/caregiver    Family Member Consulted caregiver           Patient will benefit from skilled therapeutic intervention in  order to improve the following deficits and impairments:  Difficulty walking, Decreased balance, Decreased strength, Pain, Impaired sensation  Visit Diagnosis: Other abnormalities of gait and mobility  Muscle weakness (generalized)  Unsteadiness on feet     Problem List Patient Active Problem List   Diagnosis Date Noted  . Mild cognitive impairment 12/14/2019  . Cerebral vascular disease 12/14/2019  . Gait abnormality 01/09/2019  . Paresthesia 01/09/2019  . Near syncope   . Weakness   . Essential hypertension   . Generalized weakness 12/13/2018  . Syncope 12/13/2018  . Peripheral neuropathy 06/08/2016  . Low back pain without sciatica 06/08/2016  . Abnormality of gait 06/08/2016  . Anemia due to antineoplastic chemotherapy 03/06/2016  . Hypersensitivity reaction 01/07/2016  . Port catheter in place 01/03/2016  . Malignant neoplasm of upper outer quadrant of female breast ([place]) 10/30/2015  . Primary osteoarthritis of right knee 02/28/2015  . Post-traumatic arthritis of ankle 05/10/2014  . MGUS (monoclonal gammopathy of unknown significance) 02/17/2012  . Diverticulosis of colon (without mention of hemorrhage) 01/21/2011  . Family history of malignant neoplasm of gastrointestinal tract 01/21/2011  . Special screening for malignant neoplasms, colon 01/21/2011  . Other symptoms involving digestive system(787.99) 01/21/2011    Narda Bonds, PTA [place] 01/23/20 7:06 PM Phone: [phone] Fax: [facility] [address] [place], [name], [address] Phone: [phone]   Fax:  [phone]  Name: Julie [name] MRN: [id number] Date of  Birth: 03-14-1933

## 2020-01-24 ENCOUNTER — Telehealth: Payer: Self-pay | Admitting: Hematology

## 2020-01-24 NOTE — Telephone Encounter (Signed)
Scheduled appt per 8/31 sch msg - pt daughter [name] is aware of appt added

## 2020-01-26 DIAGNOSIS — Z79899 Other long term (current) drug therapy: Secondary | ICD-10-CM | POA: Diagnosis not present

## 2020-01-26 DIAGNOSIS — M797 Fibromyalgia: Secondary | ICD-10-CM | POA: Diagnosis not present

## 2020-01-26 DIAGNOSIS — E119 Type 2 diabetes mellitus without complications: Secondary | ICD-10-CM | POA: Diagnosis not present

## 2020-01-26 DIAGNOSIS — M545 Low back pain: Secondary | ICD-10-CM | POA: Diagnosis not present

## 2020-01-30 ENCOUNTER — Other Ambulatory Visit: Payer: Self-pay

## 2020-01-30 ENCOUNTER — Ambulatory Visit: Payer: Medicare Other

## 2020-01-31 ENCOUNTER — Ambulatory Visit: Payer: Medicare Other | Admitting: Physical Therapy

## 2020-01-31 ENCOUNTER — Other Ambulatory Visit: Payer: Self-pay

## 2020-01-31 DIAGNOSIS — R2689 Other abnormalities of gait and mobility: Secondary | ICD-10-CM

## 2020-01-31 DIAGNOSIS — M6281 Muscle weakness (generalized): Secondary | ICD-10-CM

## 2020-01-31 DIAGNOSIS — R2681 Unsteadiness on feet: Secondary | ICD-10-CM

## 2020-02-01 ENCOUNTER — Other Ambulatory Visit: Payer: Self-pay

## 2020-02-01 ENCOUNTER — Encounter: Payer: Self-pay | Admitting: Physical Therapy

## 2020-02-01 ENCOUNTER — Inpatient Hospital Stay: Payer: Medicare Other | Attending: Hematology

## 2020-02-01 ENCOUNTER — Inpatient Hospital Stay: Payer: Medicare Other

## 2020-02-01 VITALS — BP 150/83 | HR 57 | Temp 98.1°F | Resp 18

## 2020-02-01 DIAGNOSIS — M810 Age-related osteoporosis without current pathological fracture: Secondary | ICD-10-CM | POA: Insufficient documentation

## 2020-02-01 DIAGNOSIS — Z95828 Presence of other vascular implants and grafts: Secondary | ICD-10-CM

## 2020-02-01 DIAGNOSIS — C50412 Malignant neoplasm of upper-outer quadrant of left female breast: Secondary | ICD-10-CM | POA: Insufficient documentation

## 2020-02-01 LAB — CBC WITH DIFFERENTIAL/PLATELET
Abs Immature Granulocytes: 0.01 10*3/uL (ref 0.00–0.07)
Basophils Absolute: 0.1 10*3/uL (ref 0.0–0.1)
Basophils Relative: 2 %
Eosinophils Absolute: 0.2 10*3/uL (ref 0.0–0.5)
Eosinophils Relative: 4 %
HCT: 35.4 % — ABNORMAL LOW (ref 36.0–46.0)
Hemoglobin: 11 g/dL — ABNORMAL LOW (ref 12.0–15.0)
Immature Granulocytes: 0 %
Lymphocytes Relative: 22 %
Lymphs Abs: 0.9 10*3/uL (ref 0.7–4.0)
MCH: 27 pg (ref 26.0–34.0)
MCHC: 31.1 g/dL (ref 30.0–36.0)
MCV: 87 fL (ref 80.0–100.0)
Monocytes Absolute: 0.5 10*3/uL (ref 0.1–1.0)
Monocytes Relative: 13 %
Neutro Abs: 2.5 10*3/uL (ref 1.7–7.7)
Neutrophils Relative %: 59 %
Platelets: 198 10*3/uL (ref 150–400)
RBC: 4.07 MIL/uL (ref 3.87–5.11)
RDW: 15.4 % (ref 11.5–15.5)
WBC: 4.2 10*3/uL (ref 4.0–10.5)
nRBC: 0 % (ref 0.0–0.2)

## 2020-02-01 LAB — COMPREHENSIVE METABOLIC PANEL
ALT: 15 U/L (ref 0–44)
AST: 27 U/L (ref 15–41)
Albumin: 3.5 g/dL (ref 3.5–5.0)
Alkaline Phosphatase: 84 U/L (ref 38–126)
Anion gap: 7 (ref 5–15)
BUN: 25 mg/dL — ABNORMAL HIGH (ref 8–23)
CO2: 29 mmol/L (ref 22–32)
Calcium: 9.1 mg/dL (ref 8.9–10.3)
Chloride: 108 mmol/L (ref 98–111)
Creatinine, Ser: 1.29 mg/dL — ABNORMAL HIGH (ref 0.44–1.00)
GFR calc Af Amer: 44 mL/min — ABNORMAL LOW (ref >60)
GFR calc non Af Amer: 38 mL/min — ABNORMAL LOW (ref >60)
Glucose, Bld: 109 mg/dL — ABNORMAL HIGH (ref 70–99)
Potassium: 3.8 mmol/L (ref 3.5–5.1)
Sodium: 144 mmol/L (ref 135–145)
Total Bilirubin: 0.5 mg/dL (ref 0.3–1.2)
Total Protein: 6.8 g/dL (ref 6.5–8.1)

## 2020-02-01 MED ORDER — DENOSUMAB 60 MG/ML ~~LOC~~ SOSY
60.0000 mg | PREFILLED_SYRINGE | Freq: Once | SUBCUTANEOUS | Status: AC
  Administered 2020-02-01: 60 mg via SUBCUTANEOUS

## 2020-02-01 MED ORDER — DENOSUMAB 60 MG/ML ~~LOC~~ SOSY
PREFILLED_SYRINGE | SUBCUTANEOUS | Status: AC
  Filled 2020-02-01: qty 1

## 2020-02-01 NOTE — Patient Instructions (Signed)
Denosumab injection What is this medicine? DENOSUMAB (den oh sue mab) slows bone breakdown. Prolia is used to treat osteoporosis in women after menopause and in men, and in people who are taking corticosteroids for 6 months or more. Xgeva is used to treat a high calcium level due to cancer and to prevent bone fractures and other bone problems caused by multiple myeloma or cancer bone metastases. Xgeva is also used to treat giant cell tumor of the bone. This medicine may be used for other purposes; ask your health care provider or pharmacist if you have questions. COMMON BRAND NAME(S): Prolia, XGEVA What should I tell my health care provider before I take this medicine? They need to know if you have any of these conditions:  dental disease  having surgery or tooth extraction  infection  kidney disease  low levels of calcium or Vitamin D in the blood  malnutrition  on hemodialysis  skin conditions or sensitivity  thyroid or parathyroid disease  an unusual reaction to denosumab, other medicines, foods, dyes, or preservatives  pregnant or trying to get pregnant  breast-feeding How should I use this medicine? This medicine is for injection under the skin. It is given by a health care professional in a hospital or clinic setting. A special MedGuide will be given to you before each treatment. Be sure to read this information carefully each time. For Prolia, talk to your pediatrician regarding the use of this medicine in children. Special care may be needed. For Xgeva, talk to your pediatrician regarding the use of this medicine in children. While this drug may be prescribed for children as young as 13 years for selected conditions, precautions do apply. Overdosage: If you think you have taken too much of this medicine contact a poison control center or emergency room at once. NOTE: This medicine is only for you. Do not share this medicine with others. What if I miss a dose? It is  important not to miss your dose. Call your doctor or health care professional if you are unable to keep an appointment. What may interact with this medicine? Do not take this medicine with any of the following medications:  other medicines containing denosumab This medicine may also interact with the following medications:  medicines that lower your chance of fighting infection  steroid medicines like prednisone or cortisone This list may not describe all possible interactions. Give your health care provider a list of all the medicines, herbs, non-prescription drugs, or dietary supplements you use. Also tell them if you smoke, drink alcohol, or use illegal drugs. Some items may interact with your medicine. What should I watch for while using this medicine? Visit your doctor or health care professional for regular checks on your progress. Your doctor or health care professional may order blood tests and other tests to see how you are doing. Call your doctor or health care professional for advice if you get a fever, chills or sore throat, or other symptoms of a cold or flu. Do not treat yourself. This drug may decrease your body's ability to fight infection. Try to avoid being around people who are sick. You should make sure you get enough calcium and vitamin D while you are taking this medicine, unless your doctor tells you not to. Discuss the foods you eat and the vitamins you take with your health care professional. See your dentist regularly. Brush and floss your teeth as directed. Before you have any dental work done, tell your dentist you are   receiving this medicine. Do not become pregnant while taking this medicine or for 5 months after stopping it. Talk with your doctor or health care professional about your birth control options while taking this medicine. Women should inform their doctor if they wish to become pregnant or think they might be pregnant. There is a potential for serious side  effects to an unborn child. Talk to your health care professional or pharmacist for more information. What side effects may I notice from receiving this medicine? Side effects that you should report to your doctor or health care professional as soon as possible:  allergic reactions like skin rash, itching or hives, swelling of the face, lips, or tongue  bone pain  breathing problems  dizziness  jaw pain, especially after dental work  redness, blistering, peeling of the skin  signs and symptoms of infection like fever or chills; cough; sore throat; pain or trouble passing urine  signs of low calcium like fast heartbeat, muscle cramps or muscle pain; pain, tingling, numbness in the hands or feet; seizures  unusual bleeding or bruising  unusually weak or tired Side effects that usually do not require medical attention (report to your doctor or health care professional if they continue or are bothersome):  constipation  diarrhea  headache  joint pain  loss of appetite  muscle pain  runny nose  tiredness  upset stomach This list may not describe all possible side effects. Call your doctor for medical advice about side effects. You may report side effects to FDA at 1-800-FDA-1088. Where should I keep my medicine? This medicine is only given in a clinic, doctor's office, or other health care setting and will not be stored at home. NOTE: This sheet is a summary. It may not cover all possible information. If you have questions about this medicine, talk to your doctor, pharmacist, or health care provider.  2020 Elsevier/Gold Standard (2017-09-17 16:10:44)

## 2020-02-01 NOTE — Therapy (Signed)
[facility] [address] [place], [name], [address] Phone: [phone]   Fax:  [phone]  Physical Therapy Treatment  Patient Details  Name: Julie [name] MRN: [id number] Date of Birth: 02/17/1933 Referring Provider (PT): Dr. Marcial Pacas, MD   Encounter Date: 01/31/2020   PT End of Session - 02/01/20 1805    Visit Number 4    Number of Visits 9    Date for PT Re-Evaluation 03/15/20    Authorization Type Medicare    Authorization Time Period 01-16-20 - 04-16-20    PT Start Time 1500    PT Stop Time 1545    PT Time Calculation (min) 45 min    Equipment Utilized During Treatment Other (comment)   ankle buoyancy cuffs, pool noodle, bar bells   Activity Tolerance Patient tolerated treatment well    Behavior During Therapy [facility] for tasks assessed/performed           Past Medical History:  Diagnosis Date  . Anemia   . Anxiety    takes Klonopin at bedtime  . Arthritis    takes Methotrexate weekly and Prednisone  . Blood transfusion    no abnormal reaction noted  . Cancer (HCC)    breast   . Cataracts, bilateral    immature  . Complication of anesthesia    wakes up during surgery  . Depression   . Diabetes mellitus without complication ([place])   . Dyspnea    with exertion since chemo   . Fibromyalgia    takes Lyrica daily  . GERD (gastroesophageal reflux disease)    takes Nexium daily  . Glaucoma   . Hard of hearing   . Heart murmur   . Histoplasmosis   . History of colon polyps   . History of gout   . History of hiatal hernia   . History of shingles   . Hyperlipidemia   . Hypertension    takes Amlodipine,Metoprolol,and Losartan daily  . Incontinence of bowel   . Interstitial cystitis   . Joint pain   . Low back pain   . Nocturia   . Peripheral neuropathy   . Peripheral neuropathy   . Personal history of chemotherapy   . Personal history of radiation therapy   . PONV (postoperative nausea and  vomiting)   . Ulcer 1970   duodenal  . Urinary frequency   . Urinary urgency   . Weakness    numbness in both hands and feet    Past Surgical History:  Procedure Laterality Date  . ABDOMINAL HYSTERECTOMY    . accupuncture  3 yrs ago  . ANKLE FUSION Left 05/10/2014   Procedure: LEFT ANKLE ARTHRODESIS ;  Surgeon: Wylene Simmer, MD;  Location: [place];  Service: Orthopedics;  Laterality: Left;  . ANKLE SURGERY Left   . arm surgery Left    radius  . bladder tacked     . BREAST LUMPECTOMY Left 10/2015  . CERVICAL FUSION    . CHOLECYSTECTOMY    . COLONOSCOPY    . CYSTOCELE REPAIR    . DILATION AND CURETTAGE OF UTERUS    . ESOPHAGOGASTRODUODENOSCOPY    . PORT A CATH REVISION N/A 12/27/2015   Procedure: PORT A CATH REVISION;  Surgeon: Rolm Bookbinder, MD;  Location: [place];  Service: General;  Laterality: N/A;  . PORT-A-CATH REMOVAL N/A 03/25/2016   Procedure: REMOVAL PORT-A-CATH;  Surgeon: Rolm Bookbinder, MD;  Location: [facility];  Service: General;  Laterality:  N/A;  . PORTACATH PLACEMENT Right 12/27/2015   Procedure: INSERTION PORT-A-CATH WITH ULTRASOUND ;  Surgeon: Rolm Bookbinder, MD;  Location: [place];  Service: General;  Laterality: Right;  . RADIOACTIVE SEED GUIDED PARTIAL MASTECTOMY WITH AXILLARY SENTINEL LYMPH NODE BIOPSY Left 11/14/2015   Procedure: RADIOACTIVE SEED GUIDED LEFT BREAST LUMPECTOMY WITH AXILLARY SENTINEL LYMPH NODE BIOPSY;  Surgeon: Rolm Bookbinder, MD;  Location: [place];  Service: General;  Laterality: Left;  RADIOACTIVE SEED GUIDED LEFT BREAST LUMPECTOMY WITH AXILLARY SENTINEL LYMPH NODE BIOPSY  . SALPINGOOPHORECTOMY Bilateral   . TOTAL KNEE ARTHROPLASTY Right 02/28/2015   Procedure: RIGHT TOTAL KNEE ARTHROPLASTY;  Surgeon: Rod Can, MD;  Location[facility];  Service: Orthopedics;  Laterality: Right;  . UPPER GASTROINTESTINAL ENDOSCOPY    . VAGINAL HYSTERECTOMY      There were no vitals filed for this  visit.   Subjective Assessment - 02/01/20 1805    Subjective Denies any falls or changes.  Felt good after last session.    Patient is accompained by: --   aide Hoyle Sauer   Pertinent History Peripheral neuropathy, OA knees, fibromyalgia, h/o Lt breast cancer    Patient Stated Goals improve strength and balance with aquatic therapy    Currently in Pain? No/denies    Pain Onset More than a month ago           Aquatic therapy at [facility]  Patient seen for aquatic therapy today. Treatment took place in water 3.5-4 feet deep depending upon activity. Pt entered and exitedthe pool via steps with supervision with use of hand rails for support.  Pt gait trained in pool - 18m x 4 reps forward for warm up exercise  Sidestepping sidestepping with squats 48m x 2 rep  Pt amb. Forward across pool using bar bells with pushing forward/pulling back for increased resistance for UE strengthening, incr. Cardiovascular functioning and for incr. Challenge with balance; Performed 48m x 6 total.  Had pt hold barbells under water for 4 of those reps.  Pt performed marching in place 10 reps each; progressed to using bar bells contralaterally for balance and coordination  Squats 10 reps with bil. UE support on wall  Performed bil LE hip flexion, hip extension, hip abd, hip add, heel raises-all x 15 reps with bil buoyancy cuffs and intermittent UE support today.  Seated on pool bench for sit<>stand x 10 with UE support.  Seated on same bench for bicycling LE's, hip flexion and LAQ-all x 15 reps.  Pt requires buoyancy of water for off loading of joints for reduced pain and spinal decompression for reduced pain with weight bearing exercises; viscosity of water Needed for resistance for strengthening. Current of water provides perturbations for challenges with balance.  Needs cues through out for technique.     PT Short Term Goals - 01/18/20 1400      PT SHORT TERM GOAL #1   Title Pt will report  pain intensity </= 3/10 after completion of aquatic therapy session.    Baseline 5/10    Time 4    Period Weeks    Status New    Target Date 02/16/20      PT SHORT TERM GOAL #2   Title Initiate HEP of aquatic exercises to be continued upon D/C from PT.    Time 4    Period Weeks    Status New    Target Date 02/16/20      PT SHORT TERM GOAL #3   Title  Pt will amb. 43m across pool with CGA with ability to recover LOB.    Time 4    Period Weeks    Status New    Target Date 02/16/20             PT Long Term Goals - 01/18/20 1400      PT LONG TERM GOAL #1   Title Pt will be independent in aquatic exercise program to be continued upon D/C from PT.    Time 8    Period Weeks    Status New      PT LONG TERM GOAL #2   Title Pt will amb. 38m x 2 reps without rest break with SBA with ability to recover LOB without UE support.    Time 8    Period Weeks    Status New      PT LONG TERM GOAL #3   Title Pt will report reduced generalized pain to </= 2/10 after aquatic therapy session.    Time 8    Period Weeks    Status New                 Plan - 02/01/20 1806    Clinical Impression Statement Pt with improved focus on tasks today.  Did not need rest breaks during session and balance improved in water today.  Cont per poc.    Personal Factors and Comorbidities Comorbidity 2;Fitness;Past/Current Experience;Time since onset of injury/illness/exacerbation;Transportation    Comorbidities fibromyalgia, peripheral neuropathy, h/o Lt breast cancer, OA knees    Examination-Activity Limitations Locomotion Level;Squat;Stairs;Bend;Stand    Examination-Participation Restrictions Meal Prep;Cleaning;Shop;Community Activity;Laundry    Stability/Clinical Decision Making Evolving/Moderate complexity    Rehab Potential Good    PT Frequency 1x / week   2x/week for 1st week - eval + aquatic session   PT Duration 8 weeks    PT Treatment/Interventions Aquatic Therapy    PT Next Visit Plan  aquatic therapy    Consulted and Agree with Plan of Care Patient;Family member/caregiver    Family Member Consulted caregiver           Patient will benefit from skilled therapeutic intervention in order to improve the following deficits and impairments:  Difficulty walking, Decreased balance, Decreased strength, Pain, Impaired sensation  Visit Diagnosis: Other abnormalities of gait and mobility  Muscle weakness (generalized)  Unsteadiness on feet     Problem List Patient Active Problem List   Diagnosis Date Noted  . Mild cognitive impairment 12/14/2019  . Cerebral vascular disease 12/14/2019  . Gait abnormality 01/09/2019  . Paresthesia 01/09/2019  . Near syncope   . Weakness   . Essential hypertension   . Generalized weakness 12/13/2018  . Syncope 12/13/2018  . Peripheral neuropathy 06/08/2016  . Low back pain without sciatica 06/08/2016  . Abnormality of gait 06/08/2016  . Anemia due to antineoplastic chemotherapy 03/06/2016  . Hypersensitivity reaction 01/07/2016  . Port catheter in place 01/03/2016  . Malignant neoplasm of upper outer quadrant of female breast ([place]) 10/30/2015  . Primary osteoarthritis of right knee 02/28/2015  . Post-traumatic arthritis of ankle 05/10/2014  . MGUS (monoclonal gammopathy of unknown significance) 02/17/2012  . Diverticulosis of colon (without mention of hemorrhage) 01/21/2011  . Family history of malignant neoplasm of gastrointestinal tract 01/21/2011  . Special screening for malignant neoplasms, colon 01/21/2011  . Other symptoms involving digestive system(787.99) 01/21/2011     Narda Bonds, PTA [place] 02/01/20 6:11 PM Phone: [phone] Fax: [phone]  [facility] [address] [place], [name], [address] Phone: [phone]   Fax:  [phone]  Name: LAKIN RHINE MRN: [id number] Date of Birth:  04-01-1933

## 2020-02-05 ENCOUNTER — Telehealth: Payer: Self-pay

## 2020-02-05 NOTE — Telephone Encounter (Signed)
Julie [name]'s daughter, Julie [name], called stating that Julie [name] had her first Prolia injection on 02/01/2020.  She then had bone pain, muscle cramps, and worsening of her peripheral neuropathy.  She states the symptoms waxed and waned for a couple of day and now have subsided.  She just wanted [place] to know.

## 2020-02-05 NOTE — Telephone Encounter (Signed)
-----   Message from Truitt Merle, MD sent at 02/03/2020  5:34 PM EDT ----- Please let pt or her daughter know her lab results, mild anemia and CKD are stable, no new concerns, thanks   Truitt Merle

## 2020-02-05 NOTE — Telephone Encounter (Signed)
Called spoke with daughter concerning lab results and no new recommendations or orders daughter is encouraged to call for question concerns or changes nothing further call ended

## 2020-02-13 ENCOUNTER — Other Ambulatory Visit: Payer: Self-pay

## 2020-02-13 ENCOUNTER — Inpatient Hospital Stay: Payer: Medicare Other

## 2020-02-13 ENCOUNTER — Other Ambulatory Visit: Payer: Self-pay | Admitting: Hematology

## 2020-02-13 DIAGNOSIS — C50412 Malignant neoplasm of upper-outer quadrant of left female breast: Secondary | ICD-10-CM | POA: Diagnosis not present

## 2020-02-13 DIAGNOSIS — M810 Age-related osteoporosis without current pathological fracture: Secondary | ICD-10-CM | POA: Diagnosis not present

## 2020-02-13 LAB — CBC WITH DIFFERENTIAL/PLATELET
Abs Immature Granulocytes: 0.01 10*3/uL (ref 0.00–0.07)
Basophils Absolute: 0 10*3/uL (ref 0.0–0.1)
Basophils Relative: 1 %
Eosinophils Absolute: 0.2 10*3/uL (ref 0.0–0.5)
Eosinophils Relative: 4 %
HCT: 36.9 % (ref 36.0–46.0)
Hemoglobin: 11.3 g/dL — ABNORMAL LOW (ref 12.0–15.0)
Immature Granulocytes: 0 %
Lymphocytes Relative: 26 %
Lymphs Abs: 1.1 10*3/uL (ref 0.7–4.0)
MCH: 26.7 pg (ref 26.0–34.0)
MCHC: 30.6 g/dL (ref 30.0–36.0)
MCV: 87.2 fL (ref 80.0–100.0)
Monocytes Absolute: 0.5 10*3/uL (ref 0.1–1.0)
Monocytes Relative: 12 %
Neutro Abs: 2.3 10*3/uL (ref 1.7–7.7)
Neutrophils Relative %: 57 %
Platelets: 220 10*3/uL (ref 150–400)
RBC: 4.23 MIL/uL (ref 3.87–5.11)
RDW: 15.6 % — ABNORMAL HIGH (ref 11.5–15.5)
WBC: 4.1 10*3/uL (ref 4.0–10.5)
nRBC: 0 % (ref 0.0–0.2)

## 2020-02-13 LAB — COMPREHENSIVE METABOLIC PANEL
ALT: 16 U/L (ref 0–44)
AST: 25 U/L (ref 15–41)
Albumin: 3.5 g/dL (ref 3.5–5.0)
Alkaline Phosphatase: 80 U/L (ref 38–126)
Anion gap: 6 (ref 5–15)
BUN: 21 mg/dL (ref 8–23)
CO2: 27 mmol/L (ref 22–32)
Calcium: 9.1 mg/dL (ref 8.9–10.3)
Chloride: 107 mmol/L (ref 98–111)
Creatinine, Ser: 1.16 mg/dL — ABNORMAL HIGH (ref 0.44–1.00)
GFR calc Af Amer: 50 mL/min — ABNORMAL LOW (ref >60)
GFR calc non Af Amer: 43 mL/min — ABNORMAL LOW (ref >60)
Glucose, Bld: 69 mg/dL — ABNORMAL LOW (ref 70–99)
Potassium: 5.2 mmol/L — ABNORMAL HIGH (ref 3.5–5.1)
Sodium: 140 mmol/L (ref 135–145)
Total Bilirubin: 0.5 mg/dL (ref 0.3–1.2)
Total Protein: 7.1 g/dL (ref 6.5–8.1)

## 2020-02-14 ENCOUNTER — Encounter: Payer: Self-pay | Admitting: Physical Therapy

## 2020-02-14 ENCOUNTER — Ambulatory Visit: Payer: Medicare Other | Attending: Neurology | Admitting: Physical Therapy

## 2020-02-14 DIAGNOSIS — M25551 Pain in right hip: Secondary | ICD-10-CM | POA: Insufficient documentation

## 2020-02-14 DIAGNOSIS — R2681 Unsteadiness on feet: Secondary | ICD-10-CM | POA: Diagnosis not present

## 2020-02-14 DIAGNOSIS — M6281 Muscle weakness (generalized): Secondary | ICD-10-CM

## 2020-02-14 DIAGNOSIS — R269 Unspecified abnormalities of gait and mobility: Secondary | ICD-10-CM | POA: Diagnosis not present

## 2020-02-14 DIAGNOSIS — R2689 Other abnormalities of gait and mobility: Secondary | ICD-10-CM

## 2020-02-14 NOTE — Therapy (Signed)
[facility] [address] [place], [name], [address] Phone: [phone]   Fax:  [phone]  Physical Therapy Treatment  Patient Details  Name: Julie [name] MRN: [id number] Date of Birth: 09-Feb-1933 Referring Provider (PT): Dr. Marcial Pacas, MD   Encounter Date: 02/14/2020   PT End of Session - 02/14/20 1705    Visit Number 5    Number of Visits 9    Date for PT Re-Evaluation 03/15/20    Authorization Type Medicare    Authorization Time Period 01-16-20 - 04-16-20    PT Start Time 1415    PT Stop Time 1500    PT Time Calculation (min) 45 min    Equipment Utilized During Treatment Other (comment)   ankle buoyancy cuffs, pool noodle, bar bells   Activity Tolerance Patient tolerated treatment well    Behavior During Therapy [facility] for tasks assessed/performed           Past Medical History:  Diagnosis Date  . Anemia   . Anxiety    takes Klonopin at bedtime  . Arthritis    takes Methotrexate weekly and Prednisone  . Blood transfusion    no abnormal reaction noted  . Cancer (HCC)    breast   . Cataracts, bilateral    immature  . Complication of anesthesia    wakes up during surgery  . Depression   . Diabetes mellitus without complication ([place])   . Dyspnea    with exertion since chemo   . Fibromyalgia    takes Lyrica daily  . GERD (gastroesophageal reflux disease)    takes Nexium daily  . Glaucoma   . Hard of hearing   . Heart murmur   . Histoplasmosis   . History of colon polyps   . History of gout   . History of hiatal hernia   . History of shingles   . Hyperlipidemia   . Hypertension    takes Amlodipine,Metoprolol,and Losartan daily  . Incontinence of bowel   . Interstitial cystitis   . Joint pain   . Low back pain   . Nocturia   . Peripheral neuropathy   . Peripheral neuropathy   . Personal history of chemotherapy   . Personal history of radiation therapy   . PONV (postoperative nausea and  vomiting)   . Ulcer 1970   duodenal  . Urinary frequency   . Urinary urgency   . Weakness    numbness in both hands and feet    Past Surgical History:  Procedure Laterality Date  . ABDOMINAL HYSTERECTOMY    . accupuncture  3 yrs ago  . ANKLE FUSION Left 05/10/2014   Procedure: LEFT ANKLE ARTHRODESIS ;  Surgeon: Wylene Simmer, MD;  Location: [place];  Service: Orthopedics;  Laterality: Left;  . ANKLE SURGERY Left   . arm surgery Left    radius  . bladder tacked     . BREAST LUMPECTOMY Left 10/2015  . CERVICAL FUSION    . CHOLECYSTECTOMY    . COLONOSCOPY    . CYSTOCELE REPAIR    . DILATION AND CURETTAGE OF UTERUS    . ESOPHAGOGASTRODUODENOSCOPY    . PORT A CATH REVISION N/A 12/27/2015   Procedure: PORT A CATH REVISION;  Surgeon: Rolm Bookbinder, MD;  Location: [place];  Service: General;  Laterality: N/A;  . PORT-A-CATH REMOVAL N/A 03/25/2016   Procedure: REMOVAL PORT-A-CATH;  Surgeon: Rolm Bookbinder, MD;  [facility];  Service: General;  Laterality:  N/A;  . PORTACATH PLACEMENT Right 12/27/2015   Procedure: INSERTION PORT-A-CATH WITH ULTRASOUND ;  Surgeon: Rolm Bookbinder, MD;  Location: [place];  Service: General;  Laterality: Right;  . RADIOACTIVE SEED GUIDED PARTIAL MASTECTOMY WITH AXILLARY SENTINEL LYMPH NODE BIOPSY Left 11/14/2015   Procedure: RADIOACTIVE SEED GUIDED LEFT BREAST LUMPECTOMY WITH AXILLARY SENTINEL LYMPH NODE BIOPSY;  Surgeon: Rolm Bookbinder, MD;  Location: [place];  Service: General;  Laterality: Left;  RADIOACTIVE SEED GUIDED LEFT BREAST LUMPECTOMY WITH AXILLARY SENTINEL LYMPH NODE BIOPSY  . SALPINGOOPHORECTOMY Bilateral   . TOTAL KNEE ARTHROPLASTY Right 02/28/2015   Procedure: RIGHT TOTAL KNEE ARTHROPLASTY;  Surgeon: Rod Can, MD;  [facility];  Service: Orthopedics;  Laterality: Right;  . UPPER GASTROINTESTINAL ENDOSCOPY    . VAGINAL HYSTERECTOMY      There were no vitals filed for this  visit.   Subjective Assessment - 02/14/20 1704    Subjective Denies falls or changes.    Patient is accompained by: --   aide Hoyle Sauer   Pertinent History Peripheral neuropathy, OA knees, fibromyalgia, h/o Lt breast cancer    Patient Stated Goals improve strength and balance with aquatic therapy    Currently in Pain? No/denies    Pain Onset More than a month ago           Aquatic therapy at [facility]  Patient seen for aquatic therapy today. Treatment took place in water 3.5-4 feet deep depending upon activity. Pt entered and exitedthe pool via ramp due to proximity with supervision with use of hand rails for support.  Pt gait trained in pool - 34m x 4 reps forward for warm up exercise  Sidesteppingwithsquats 26m x 2 rep with hand bar bells for UE resistance  Pt amb. Forward across pool usingbar bellswith pushing forward/pulling back for increased resistance for UE strengthening, incr. Cardiovascular functioning and for incr. Challenge with balance; Performed 65m x 6 total. Had pt hold barbells under water for 4 of those reps.  25, x 2 holding pool noodle moving forward/backward for resistance.  Squats 15 reps with 1 UE support on wall  Single leg squatis x 15 with 1 UE assist  Performed bil LE hip flexion, hip extension, hip abd, hip add, heel raises, hip circles both directions, hamstring curls, straight leg hip flexion-all x 15 reps with bil buoyancy cuffs and intermittent UE support today.  Ai Chi postures of enclosing and soothing x 15 reps each.  Standing with back to pool wall with bil barbells moving into shoulder extension pushing barbells back towards wall.   Pt requires buoyancy of water for off loading of joints for reduced pain and spinal decompression for reduced pain with weight bearing exercises; viscosity of water Needed for resistance for strengthening. Current of water provides perturbations for challenges with balance. Needs cues through out for  technique.     PT Short Term Goals - 01/18/20 1400      PT SHORT TERM GOAL #1   Title Pt will report pain intensity </= 3/10 after completion of aquatic therapy session.    Baseline 5/10    Time 4    Period Weeks    Status New    Target Date 02/16/20      PT SHORT TERM GOAL #2   Title Initiate HEP of aquatic exercises to be continued upon D/C from PT.    Time 4    Period Weeks    Status New    Target Date 02/16/20  PT SHORT TERM GOAL #3   Title Pt will amb. 47m across pool with CGA with ability to recover LOB.    Time 4    Period Weeks    Status New    Target Date 02/16/20             PT Long Term Goals - 01/18/20 1400      PT LONG TERM GOAL #1   Title Pt will be independent in aquatic exercise program to be continued upon D/C from PT.    Time 8    Period Weeks    Status New      PT LONG TERM GOAL #2   Title Pt will amb. 41m x 2 reps without rest break with SBA with ability to recover LOB without UE support.    Time 8    Period Weeks    Status New      PT LONG TERM GOAL #3   Title Pt will report reduced generalized pain to </= 2/10 after aquatic therapy session.    Time 8    Period Weeks    Status New                 Plan - 02/14/20 1705    Clinical Impression Statement Pt's endurance continues to improve and reports carryover with activities at home.  Focused today during session and no rest breaks neeeded.  Cont per poc.    Personal Factors and Comorbidities Comorbidity 2;Fitness;Past/Current Experience;Time since onset of injury/illness/exacerbation;Transportation    Comorbidities fibromyalgia, peripheral neuropathy, h/o Lt breast cancer, OA knees    Examination-Activity Limitations Locomotion Level;Squat;Stairs;Bend;Stand    Examination-Participation Restrictions Meal Prep;Cleaning;Shop;Community Activity;Laundry    Stability/Clinical Decision Making Evolving/Moderate complexity    Rehab Potential Good    PT Frequency 1x / week   2x/week  for 1st week - eval + aquatic session   PT Duration 8 weeks    PT Treatment/Interventions Aquatic Therapy    PT Next Visit Plan aquatic therapy    Consulted and Agree with Plan of Care Patient;Family member/caregiver    Family Member Consulted caregiver           Patient will benefit from skilled therapeutic intervention in order to improve the following deficits and impairments:  Difficulty walking, Decreased balance, Decreased strength, Pain, Impaired sensation  Visit Diagnosis: Other abnormalities of gait and mobility  Muscle weakness (generalized)  Unsteadiness on feet     Problem List Patient Active Problem List   Diagnosis Date Noted  . Mild cognitive impairment 12/14/2019  . Cerebral vascular disease 12/14/2019  . Gait abnormality 01/09/2019  . Paresthesia 01/09/2019  . Near syncope   . Weakness   . Essential hypertension   . Generalized weakness 12/13/2018  . Syncope 12/13/2018  . Peripheral neuropathy 06/08/2016  . Low back pain without sciatica 06/08/2016  . Abnormality of gait 06/08/2016  . Anemia due to antineoplastic chemotherapy 03/06/2016  . Hypersensitivity reaction 01/07/2016  . Port catheter in place 01/03/2016  . Malignant neoplasm of upper outer quadrant of female breast ([place]) 10/30/2015  . Primary osteoarthritis of right knee 02/28/2015  . Post-traumatic arthritis of ankle 05/10/2014  . MGUS (monoclonal gammopathy of unknown significance) 02/17/2012  . Diverticulosis of colon (without mention of hemorrhage) 01/21/2011  . Family history of malignant neoplasm of gastrointestinal tract 01/21/2011  . Special screening for malignant neoplasms, colon 01/21/2011  . Other symptoms involving digestive system(787.99) 01/21/2011    Narda Bonds, PTA [place] 02/14/20  5:14 PM Phone: [phone] Fax: [facility] [address] [place], [name], [address] Phone: [phone]   Fax:  [phone]  Name: Julie [name] MRN: [id number] Date of Birth: 02-25-1933

## 2020-02-20 ENCOUNTER — Telehealth: Payer: Self-pay

## 2020-02-20 DIAGNOSIS — M545 Low back pain: Secondary | ICD-10-CM | POA: Diagnosis not present

## 2020-02-20 DIAGNOSIS — H6123 Impacted cerumen, bilateral: Secondary | ICD-10-CM | POA: Diagnosis not present

## 2020-02-20 DIAGNOSIS — Z23 Encounter for immunization: Secondary | ICD-10-CM | POA: Diagnosis not present

## 2020-02-20 DIAGNOSIS — M25551 Pain in right hip: Secondary | ICD-10-CM | POA: Diagnosis not present

## 2020-02-20 DIAGNOSIS — E119 Type 2 diabetes mellitus without complications: Secondary | ICD-10-CM | POA: Diagnosis not present

## 2020-02-20 DIAGNOSIS — Z9181 History of falling: Secondary | ICD-10-CM | POA: Diagnosis not present

## 2020-02-20 DIAGNOSIS — G894 Chronic pain syndrome: Secondary | ICD-10-CM | POA: Diagnosis not present

## 2020-02-20 NOTE — Telephone Encounter (Signed)
I spoke with Igrid, Ms Mcmeans's daughter.  I reviewed Dr. Ernestina Penna comments and recommendations.   She verbalized understanding. Ms Petre is not on any potassium supplements.  She is taking a prenatal vitamin, but this does not contain potassium.

## 2020-02-20 NOTE — Telephone Encounter (Signed)
-----   Message from Truitt Merle, MD sent at 02/13/2020 12:42 PM EDT ----- Please let pt know her lab results, mild anemia and CKD overall stable, K slightly high, I do not see K supplement on her med list, please check, and let her avoid high K diet, no other new concerns, thanks   Truitt Merle

## 2020-02-21 ENCOUNTER — Ambulatory Visit: Payer: Medicare Other | Admitting: Physical Therapy

## 2020-02-21 ENCOUNTER — Other Ambulatory Visit: Payer: Self-pay

## 2020-02-21 DIAGNOSIS — R2681 Unsteadiness on feet: Secondary | ICD-10-CM

## 2020-02-21 DIAGNOSIS — M6281 Muscle weakness (generalized): Secondary | ICD-10-CM | POA: Diagnosis not present

## 2020-02-21 DIAGNOSIS — M25551 Pain in right hip: Secondary | ICD-10-CM

## 2020-02-21 DIAGNOSIS — R2689 Other abnormalities of gait and mobility: Secondary | ICD-10-CM

## 2020-02-21 DIAGNOSIS — R269 Unspecified abnormalities of gait and mobility: Secondary | ICD-10-CM

## 2020-02-22 ENCOUNTER — Encounter: Payer: Self-pay | Admitting: Physical Therapy

## 2020-02-22 NOTE — Therapy (Signed)
[facility] [address] [place], [name], [address] Phone: [phone]   Fax:  [phone]  Physical Therapy Treatment  Patient Details  Name: Julie [name] MRN: [id number] Date of Birth: Jul 08, 1932 Referring Provider (PT): Dr. Marcial Pacas, MD   Encounter Date: 02/21/2020   PT End of Session - 02/22/20 1053    Visit Number 6    Number of Visits 9    Date for PT Re-Evaluation 03/15/20    Authorization Type Medicare    Authorization Time Period 01-16-20 - 04-16-20    PT Start Time 1430    PT Stop Time 1500    PT Time Calculation (min) 30 min    Equipment Utilized During Treatment Other (comment)   ankle buoyancy cuffs, pool noodle, bar bells   Activity Tolerance Patient tolerated treatment well    Behavior During Therapy [facility] for tasks assessed/performed           Past Medical History:  Diagnosis Date  . Anemia   . Anxiety    takes Klonopin at bedtime  . Arthritis    takes Methotrexate weekly and Prednisone  . Blood transfusion    no abnormal reaction noted  . Cancer (HCC)    breast   . Cataracts, bilateral    immature  . Complication of anesthesia    wakes up during surgery  . Depression   . Diabetes mellitus without complication ([place])   . Dyspnea    with exertion since chemo   . Fibromyalgia    takes Lyrica daily  . GERD (gastroesophageal reflux disease)    takes Nexium daily  . Glaucoma   . Hard of hearing   . Heart murmur   . Histoplasmosis   . History of colon polyps   . History of gout   . History of hiatal hernia   . History of shingles   . Hyperlipidemia   . Hypertension    takes Amlodipine,Metoprolol,and Losartan daily  . Incontinence of bowel   . Interstitial cystitis   . Joint pain   . Low back pain   . Nocturia   . Peripheral neuropathy   . Peripheral neuropathy   . Personal history of chemotherapy   . Personal history of radiation therapy   . PONV (postoperative nausea and  vomiting)   . Ulcer 1970   duodenal  . Urinary frequency   . Urinary urgency   . Weakness    numbness in both hands and feet    Past Surgical History:  Procedure Laterality Date  . ABDOMINAL HYSTERECTOMY    . accupuncture  3 yrs ago  . ANKLE FUSION Left 05/10/2014   Procedure: LEFT ANKLE ARTHRODESIS ;  Surgeon: Wylene Simmer, MD;  Location: [facility];  Service: Orthopedics;  Laterality: Left;  . ANKLE SURGERY Left   . arm surgery Left    radius  . bladder tacked     . BREAST LUMPECTOMY Left 10/2015  . CERVICAL FUSION    . CHOLECYSTECTOMY    . COLONOSCOPY    . CYSTOCELE REPAIR    . DILATION AND CURETTAGE OF UTERUS    . ESOPHAGOGASTRODUODENOSCOPY    . PORT A CATH REVISION N/A 12/27/2015   Procedure: PORT A CATH REVISION;  Surgeon: Rolm Bookbinder, MD;  Location: [facility];  Service: General;  Laterality: N/A;  . PORT-A-CATH REMOVAL N/A 03/25/2016   Procedure: REMOVAL PORT-A-CATH;  Surgeon: Rolm Bookbinder, MD;  Location[facility];  Service: General;  Laterality:  N/A;  . PORTACATH PLACEMENT Right 12/27/2015   Procedure: INSERTION PORT-A-CATH WITH ULTRASOUND ;  Surgeon: Rolm Bookbinder, MD;  Location: [facility];  Service: General;  Laterality: Right;  . RADIOACTIVE SEED GUIDED PARTIAL MASTECTOMY WITH AXILLARY SENTINEL LYMPH NODE BIOPSY Left 11/14/2015   Procedure: RADIOACTIVE SEED GUIDED LEFT BREAST LUMPECTOMY WITH AXILLARY SENTINEL LYMPH NODE BIOPSY;  Surgeon: Rolm Bookbinder, MD;  Location: [facility];  Service: General;  Laterality: Left;  RADIOACTIVE SEED GUIDED LEFT BREAST LUMPECTOMY WITH AXILLARY SENTINEL LYMPH NODE BIOPSY  . SALPINGOOPHORECTOMY Bilateral   . TOTAL KNEE ARTHROPLASTY Right 02/28/2015   Procedure: RIGHT TOTAL KNEE ARTHROPLASTY;  Surgeon: Rod Can, MD;  [facility];  Service: Orthopedics;  Laterality: Right;  . UPPER GASTROINTESTINAL ENDOSCOPY    . VAGINAL HYSTERECTOMY      There were no vitals filed for this  visit.   Subjective Assessment - 02/22/20 1051    Subjective Pt arrived late for appointment.  Denies falls or changes.    Patient is accompained by: --   aide Hoyle Sauer   Pertinent History Peripheral neuropathy, OA knees, fibromyalgia, h/o Lt breast cancer    Patient Stated Goals improve strength and balance with aquatic therapy    Currently in Pain? Yes    Pain Score 5     Pain Location Hip    Pain Orientation Right    Pain Descriptors / Indicators Aching    Pain Type Chronic pain    Pain Onset More than a month ago    Pain Frequency Intermittent    Pain Relieving Factors hip injection           Aquatic therapy at [facility]  Patient seen for aquatic therapy today. Treatment took place in water 3.5-4 feet deep depending upon activity. Pt entered and exitedthe pool viaramp due to proximity with supervisionwith use of hand rails for support.  Pt gait trained in pool - 54m x 4 reps forward for warm up exercise  Sidesteppingwithsquats 103m x2rep with hand bar bells for UE resistance  Pt amb. Forward across pool usingbar bellswith pushing forward/pulling back for increased resistance for UE strengthening, incr. Cardiovascular functioning and for incr. Challenge with balance; Performed 37m x 6 total. Had pt hold barbells under water for 4 of those reps.  25, x 2 holding pool noodle moving forward/backward for resistance.  Squats 15 reps with 1 UE support on wall  Single leg squatis x 15 with 1 UE assist  Performed bil LE hip flexion, hip extension, hip abd, hip add, hip external rotation with hip flexed at 90, hip circles both directions, hamstring curls, straight leg hip flexion-all x 20 repswith bil buoyancy cuffs and intermittent UE support today.  Half jumping jacks with pool edge for support x 10 x 2   Standing with back to pool wall with bil barbells moving into shoulder extension pushing barbells back towards wall.   Pt requires buoyancy of water for off  loading of joints for reduced pain and spinal decompression for reduced pain with weight bearing exercises; viscosity of water Needed for resistance for strengthening. Current of water provides perturbations for challenges with balance. Needs cues through out for technique.     PT Short Term Goals - 01/18/20 1400      PT SHORT TERM GOAL #1   Title Pt will report pain intensity </= 3/10 after completion of aquatic therapy session.    Baseline 5/10    Time 4    Period Weeks  Status New    Target Date 02/16/20      PT SHORT TERM GOAL #2   Title Initiate HEP of aquatic exercises to be continued upon D/C from PT.    Time 4    Period Weeks    Status New    Target Date 02/16/20      PT SHORT TERM GOAL #3   Title Pt will amb. 77m across pool with CGA with ability to recover LOB.    Time 4    Period Weeks    Status New    Target Date 02/16/20             PT Long Term Goals - 01/18/20 1400      PT LONG TERM GOAL #1   Title Pt will be independent in aquatic exercise program to be continued upon D/C from PT.    Time 8    Period Weeks    Status New      PT LONG TERM GOAL #2   Title Pt will amb. 28m x 2 reps without rest break with SBA with ability to recover LOB without UE support.    Time 8    Period Weeks    Status New      PT LONG TERM GOAL #3   Title Pt will report reduced generalized pain to </= 2/10 after aquatic therapy session.    Time 8    Period Weeks    Status New                 Plan - 02/22/20 1053    Clinical Impression Statement Skilled session continues to focus on balance, strength, endurance, rom and decreasing pain.  Pt progressing toward goals.  Cont per poc.    Personal Factors and Comorbidities Comorbidity 2;Fitness;Past/Current Experience;Time since onset of injury/illness/exacerbation;Transportation    Comorbidities fibromyalgia, peripheral neuropathy, h/o Lt breast cancer, OA knees    Examination-Activity Limitations Locomotion  Level;Squat;Stairs;Bend;Stand    Examination-Participation Restrictions Meal Prep;Cleaning;Shop;Community Activity;Laundry    Stability/Clinical Decision Making Evolving/Moderate complexity    Rehab Potential Good    PT Frequency 1x / week   2x/week for 1st week - eval + aquatic session   PT Duration 8 weeks    PT Treatment/Interventions Aquatic Therapy    PT Next Visit Plan aquatic therapy    Consulted and Agree with Plan of Care Patient;Family member/caregiver    Family Member Consulted caregiver           Patient will benefit from skilled therapeutic intervention in order to improve the following deficits and impairments:  Difficulty walking, Decreased balance, Decreased strength, Pain, Impaired sensation  Visit Diagnosis: Other abnormalities of gait and mobility  Muscle weakness (generalized)  Unsteadiness on feet  Pain in right hip  Gait abnormality     Problem List Patient Active Problem List   Diagnosis Date Noted  . Mild cognitive impairment 12/14/2019  . Cerebral vascular disease 12/14/2019  . Gait abnormality 01/09/2019  . Paresthesia 01/09/2019  . Near syncope   . Weakness   . Essential hypertension   . Generalized weakness 12/13/2018  . Syncope 12/13/2018  . Peripheral neuropathy 06/08/2016  . Low back pain without sciatica 06/08/2016  . Abnormality of gait 06/08/2016  . Anemia due to antineoplastic chemotherapy 03/06/2016  . Hypersensitivity reaction 01/07/2016  . Port catheter in place 01/03/2016  . Malignant neoplasm of upper outer quadrant of female breast ([place]) 10/30/2015  . Primary osteoarthritis of right knee 02/28/2015  . Post-traumatic  arthritis of ankle 05/10/2014  . MGUS (monoclonal gammopathy of unknown significance) 02/17/2012  . Diverticulosis of colon (without mention of hemorrhage) 01/21/2011  . Family history of malignant neoplasm of gastrointestinal tract 01/21/2011  . Special screening for malignant neoplasms, colon 01/21/2011  .  Other symptoms involving digestive system(787.99) 01/21/2011    Narda Bonds, PTA [place] 02/22/20 10:57 AM Phone: [phone] Fax: [facility] [address] [place], [name], [address] Phone: [phone]   Fax:  [phone]  Name: SHAREA GUINTHER MRN: [id number] Date of Birth: 02-11-1933

## 2020-02-27 DIAGNOSIS — Z79899 Other long term (current) drug therapy: Secondary | ICD-10-CM | POA: Diagnosis not present

## 2020-02-27 DIAGNOSIS — M797 Fibromyalgia: Secondary | ICD-10-CM | POA: Diagnosis not present

## 2020-02-27 DIAGNOSIS — M545 Low back pain, unspecified: Secondary | ICD-10-CM | POA: Diagnosis not present

## 2020-02-27 DIAGNOSIS — E119 Type 2 diabetes mellitus without complications: Secondary | ICD-10-CM | POA: Diagnosis not present

## 2020-02-27 DIAGNOSIS — Z9181 History of falling: Secondary | ICD-10-CM | POA: Diagnosis not present

## 2020-02-28 ENCOUNTER — Telehealth: Payer: Self-pay

## 2020-02-28 ENCOUNTER — Ambulatory Visit: Payer: Medicare Other | Admitting: Physical Therapy

## 2020-02-28 NOTE — Telephone Encounter (Signed)
Julie Shreiner's daughter [name] called stating that Julie [name] is complainingof her upper jaw and teeth are uncomfortable and her teeth feel loose.  This started yesterday.  I referred her to her dentists for evaluation.

## 2020-02-29 DIAGNOSIS — M4800 Spinal stenosis, site unspecified: Secondary | ICD-10-CM | POA: Diagnosis not present

## 2020-02-29 DIAGNOSIS — E785 Hyperlipidemia, unspecified: Secondary | ICD-10-CM | POA: Diagnosis not present

## 2020-02-29 DIAGNOSIS — D649 Anemia, unspecified: Secondary | ICD-10-CM | POA: Diagnosis not present

## 2020-02-29 DIAGNOSIS — K219 Gastro-esophageal reflux disease without esophagitis: Secondary | ICD-10-CM | POA: Diagnosis not present

## 2020-02-29 DIAGNOSIS — M5137 Other intervertebral disc degeneration, lumbosacral region: Secondary | ICD-10-CM | POA: Diagnosis not present

## 2020-02-29 DIAGNOSIS — I10 Essential (primary) hypertension: Secondary | ICD-10-CM | POA: Diagnosis not present

## 2020-02-29 DIAGNOSIS — M7061 Trochanteric bursitis, right hip: Secondary | ICD-10-CM | POA: Diagnosis not present

## 2020-02-29 DIAGNOSIS — M810 Age-related osteoporosis without current pathological fracture: Secondary | ICD-10-CM | POA: Diagnosis not present

## 2020-02-29 DIAGNOSIS — E119 Type 2 diabetes mellitus without complications: Secondary | ICD-10-CM | POA: Diagnosis not present

## 2020-03-04 ENCOUNTER — Ambulatory Visit: Payer: Medicare Other | Attending: Neurology | Admitting: Physical Therapy

## 2020-03-04 ENCOUNTER — Other Ambulatory Visit: Payer: Self-pay

## 2020-03-04 ENCOUNTER — Encounter: Payer: Self-pay | Admitting: Physical Therapy

## 2020-03-04 DIAGNOSIS — M6281 Muscle weakness (generalized): Secondary | ICD-10-CM | POA: Insufficient documentation

## 2020-03-04 DIAGNOSIS — R269 Unspecified abnormalities of gait and mobility: Secondary | ICD-10-CM | POA: Insufficient documentation

## 2020-03-04 DIAGNOSIS — R2681 Unsteadiness on feet: Secondary | ICD-10-CM | POA: Insufficient documentation

## 2020-03-04 DIAGNOSIS — R2689 Other abnormalities of gait and mobility: Secondary | ICD-10-CM | POA: Insufficient documentation

## 2020-03-04 DIAGNOSIS — M25551 Pain in right hip: Secondary | ICD-10-CM | POA: Insufficient documentation

## 2020-03-04 NOTE — Therapy (Signed)
[facility] [address] [place], [name], [address] Phone: [phone]   Fax:  [phone]  Physical Therapy Treatment  Patient Details  Name: Julie [name] MRN: [id number] Date of Birth: [birth year] Referring Provider (PT): Dr. Marcial Pacas, MD   Encounter Date: 03/04/2020   PT End of Session - 03/04/20 1919    Visit Number 7    Number of Visits 9    Date for PT Re-Evaluation 03/15/20    Authorization Type Medicare    Authorization Time Period 01-16-20 - 04-16-20    PT Start Time 1415    PT Stop Time 1500    PT Time Calculation (min) 45 min    Equipment Utilized During Treatment Other (comment)   ankle buoyancy cuffs, pool noodle, bar bells   Activity Tolerance Patient tolerated treatment well    Behavior During Therapy [facility] for tasks assessed/performed           Past Medical History:  Diagnosis Date  . Anemia   . Anxiety    takes Klonopin at bedtime  . Arthritis    takes Methotrexate weekly and Prednisone  . Blood transfusion    no abnormal reaction noted  . Cancer (HCC)    breast   . Cataracts, bilateral    immature  . Complication of anesthesia    wakes up during surgery  . Depression   . Diabetes mellitus without complication ([place])   . Dyspnea    with exertion since chemo   . Fibromyalgia    takes Lyrica daily  . GERD (gastroesophageal reflux disease)    takes Nexium daily  . Glaucoma   . Hard of hearing   . Heart murmur   . Histoplasmosis   . History of colon polyps   . History of gout   . History of hiatal hernia   . History of shingles   . Hyperlipidemia   . Hypertension    takes Amlodipine,Metoprolol,and Losartan daily  . Incontinence of bowel   . Interstitial cystitis   . Joint pain   . Low back pain   . Nocturia   . Peripheral neuropathy   . Peripheral neuropathy   . Personal history of chemotherapy   . Personal history of radiation therapy   . PONV (postoperative nausea and  vomiting)   . Ulcer 1970   duodenal  . Urinary frequency   . Urinary urgency   . Weakness    numbness in both hands and feet    Past Surgical History:  Procedure Laterality Date  . ABDOMINAL HYSTERECTOMY    . accupuncture  3 yrs ago  . ANKLE FUSION Left 05/10/2014   Procedure: LEFT ANKLE ARTHRODESIS ;  Surgeon: Wylene Simmer, MD;  Location: [facility];  Service: Orthopedics;  Laterality: Left;  . ANKLE SURGERY Left   . arm surgery Left    radius  . bladder tacked     . BREAST LUMPECTOMY Left 10/2015  . CERVICAL FUSION    . CHOLECYSTECTOMY    . COLONOSCOPY    . CYSTOCELE REPAIR    . DILATION AND CURETTAGE OF UTERUS    . ESOPHAGOGASTRODUODENOSCOPY    . PORT A CATH REVISION N/A 12/27/2015   Procedure: PORT A CATH REVISION;  Surgeon: Rolm Bookbinder, MD;  Location: [name];  Service: General;  Laterality: N/A;  . PORT-A-CATH REMOVAL N/A 03/25/2016   Procedure: REMOVAL PORT-A-CATH;  Surgeon: Rolm Bookbinder, MD;  [facility];  Service: General;  Laterality:  N/A;  . PORTACATH PLACEMENT Right 12/27/2015   Procedure: INSERTION PORT-A-CATH WITH ULTRASOUND ;  Surgeon: Rolm Bookbinder, MD;  Location: [facility];  Service: General;  Laterality: Right;  . RADIOACTIVE SEED GUIDED PARTIAL MASTECTOMY WITH AXILLARY SENTINEL LYMPH NODE BIOPSY Left 11/14/2015   Procedure: RADIOACTIVE SEED GUIDED LEFT BREAST LUMPECTOMY WITH AXILLARY SENTINEL LYMPH NODE BIOPSY;  Surgeon: Rolm Bookbinder, MD;  Location: [facility];  Service: General;  Laterality: Left;  RADIOACTIVE SEED GUIDED LEFT BREAST LUMPECTOMY WITH AXILLARY SENTINEL LYMPH NODE BIOPSY  . SALPINGOOPHORECTOMY Bilateral   . TOTAL KNEE ARTHROPLASTY Right 02/28/2015   Procedure: RIGHT TOTAL KNEE ARTHROPLASTY;  Surgeon: Rod Can, MD;  [facility];  Service: Orthopedics;  Laterality: Right;  . UPPER GASTROINTESTINAL ENDOSCOPY    . VAGINAL HYSTERECTOMY      There were no vitals filed for this  visit.   Subjective Assessment - 03/04/20 1918    Subjective Pt states she is going out of town next week to visit her daughter in [place].    Patient is accompained by: --   aide Hoyle Sauer   Pertinent History Peripheral neuropathy, OA knees, fibromyalgia, h/o Lt breast cancer    Patient Stated Goals improve strength and balance with aquatic therapy    Currently in Pain? No/denies    Pain Onset More than a month ago           Aquatic therapy at [facility]  Patient seen for aquatic therapy today. Treatment took place in water 3.5-4 feet deep depending upon activity. Pt entered and exitedthe pool viaramp due to proximitywith supervisionwith use of hand rails for support.  Pt performed runners stretch bil sides x 30 sec at pool edge then feet up wall x 30 seconds.  Pt gait trained in pool - 47m x 4 reps forward, 17m x 2 backwards, Sidesteppingwithsquats 71m x2repwith hand bar bells for UE resistance  Pt amb. Forward across pool usingbar bellswith pushing forward/pulling back for increased resistance for UE strengthening, incr. Cardiovascular functioning and for incr. Challenge with balance; Performed 36m x 6 total. Had pt hold barbells under water for 4 of those reps.25, x 2 holding pool noodle moving forward/backward for resistance.  Squats 15reps with 1UE support on wall Single leg squats x 15 with 1 UE assist  Performed bil LE hip flexion, hip extension, hip abd, hip add, hip external rotation with hip flexed at 90, hip circles both directions, hamstring curls, straight leg hip flexion-all x 20 repswith bil buoyancy cuffs and intermittent UE support today.  Standing with back to pool wall with bil barbells moving into shoulder extension pushing barbells back towards wall.  Pt requires buoyancy of water for off loading of joints for reduced pain and spinal decompression for reduced pain with weight bearing exercises; viscosity of water Needed for resistance for  strengthening. Current of water provides perturbations for challenges with balance. Needs cues through out for technique.    PT Short Term Goals - 01/18/20 1400      PT SHORT TERM GOAL #1   Title Pt will report pain intensity </= 3/10 after completion of aquatic therapy session.    Baseline 5/10    Time 4    Period Weeks    Status New    Target Date 02/16/20      PT SHORT TERM GOAL #2   Title Initiate HEP of aquatic exercises to be continued upon D/C from PT.    Time 4    Period Weeks  Status New    Target Date 02/16/20      PT SHORT TERM GOAL #3   Title Pt will amb. 102m across pool with CGA with ability to recover LOB.    Time 4    Period Weeks    Status New    Target Date 02/16/20             PT Long Term Goals - 01/18/20 1400      PT LONG TERM GOAL #1   Title Pt will be independent in aquatic exercise program to be continued upon D/C from PT.    Time 8    Period Weeks    Status New      PT LONG TERM GOAL #2   Title Pt will amb. 106m x 2 reps without rest break with SBA with ability to recover LOB without UE support.    Time 8    Period Weeks    Status New      PT LONG TERM GOAL #3   Title Pt will report reduced generalized pain to </= 2/10 after aquatic therapy session.    Time 8    Period Weeks    Status New                 Plan - 03/04/20 1920    Clinical Impression Statement Skilled session focused on balance, strength, endurance and flexibility.  Pt reports feeling like to sessions are helping her.  Pt expressing concerns over continuing aquatics on her own with the cooler weather approaching.  Cont PT per poc.    Personal Factors and Comorbidities Comorbidity 2;Fitness;Past/Current Experience;Time since onset of injury/illness/exacerbation;Transportation    Comorbidities fibromyalgia, peripheral neuropathy, h/o Lt breast cancer, OA knees    Examination-Activity Limitations Locomotion Level;Squat;Stairs;Bend;Stand     Examination-Participation Restrictions Meal Prep;Cleaning;Shop;Community Activity;Laundry    Stability/Clinical Decision Making Evolving/Moderate complexity    Rehab Potential Good    PT Frequency 1x / week   2x/week for 1st week - eval + aquatic session   PT Duration 8 weeks    PT Treatment/Interventions Aquatic Therapy    PT Next Visit Plan aquatic therapy    Consulted and Agree with Plan of Care Patient;Family member/caregiver    Family Member Consulted caregiver           Patient will benefit from skilled therapeutic intervention in order to improve the following deficits and impairments:  Difficulty walking, Decreased balance, Decreased strength, Pain, Impaired sensation  Visit Diagnosis: Other abnormalities of gait and mobility  Muscle weakness (generalized)  Unsteadiness on feet  Pain in right hip  Gait abnormality     Problem List Patient Active Problem List   Diagnosis Date Noted  . Mild cognitive impairment 12/14/2019  . Cerebral vascular disease 12/14/2019  . Gait abnormality 01/09/2019  . Paresthesia 01/09/2019  . Near syncope   . Weakness   . Essential hypertension   . Generalized weakness 12/13/2018  . Syncope 12/13/2018  . Peripheral neuropathy 06/08/2016  . Low back pain without sciatica 06/08/2016  . Abnormality of gait 06/08/2016  . Anemia due to antineoplastic chemotherapy 03/06/2016  . Hypersensitivity reaction 01/07/2016  . Port catheter in place 01/03/2016  . Malignant neoplasm of upper outer quadrant of female breast ([place]) 10/30/2015  . Primary osteoarthritis of right knee 02/28/2015  . Post-traumatic arthritis of ankle 05/10/2014  . MGUS (monoclonal gammopathy of unknown significance) 02/17/2012  . Diverticulosis of colon (without mention of hemorrhage) 01/21/2011  . Family history of malignant  neoplasm of gastrointestinal tract 01/21/2011  . Special screening for malignant neoplasms, colon 01/21/2011  . Other symptoms involving digestive  system(787.99) 01/21/2011    Narda Bonds, PTA [place] 03/04/20 7:26 PM Phone: [phone] Fax: [facility] [address] [place], [name], [address] Phone: [phone]   Fax:  [phone]  Name: AUDRYNA WENDT MRN: [id number] Date of Birth: 1933/03/26

## 2020-03-05 ENCOUNTER — Ambulatory Visit: Payer: Medicare Other

## 2020-03-05 DIAGNOSIS — M19032 Primary osteoarthritis, left wrist: Secondary | ICD-10-CM | POA: Diagnosis not present

## 2020-03-05 DIAGNOSIS — M5137 Other intervertebral disc degeneration, lumbosacral region: Secondary | ICD-10-CM | POA: Diagnosis not present

## 2020-03-05 DIAGNOSIS — M0579 Rheumatoid arthritis with rheumatoid factor of multiple sites without organ or systems involvement: Secondary | ICD-10-CM | POA: Diagnosis not present

## 2020-03-05 DIAGNOSIS — M79642 Pain in left hand: Secondary | ICD-10-CM | POA: Diagnosis not present

## 2020-03-05 DIAGNOSIS — M25532 Pain in left wrist: Secondary | ICD-10-CM | POA: Diagnosis not present

## 2020-03-05 DIAGNOSIS — M19041 Primary osteoarthritis, right hand: Secondary | ICD-10-CM | POA: Diagnosis not present

## 2020-03-05 DIAGNOSIS — M8580 Other specified disorders of bone density and structure, unspecified site: Secondary | ICD-10-CM | POA: Diagnosis not present

## 2020-03-05 DIAGNOSIS — M79641 Pain in right hand: Secondary | ICD-10-CM | POA: Diagnosis not present

## 2020-03-05 DIAGNOSIS — M4800 Spinal stenosis, site unspecified: Secondary | ICD-10-CM | POA: Diagnosis not present

## 2020-03-05 DIAGNOSIS — M6281 Muscle weakness (generalized): Secondary | ICD-10-CM | POA: Diagnosis not present

## 2020-03-05 DIAGNOSIS — R700 Elevated erythrocyte sedimentation rate: Secondary | ICD-10-CM | POA: Diagnosis not present

## 2020-03-05 DIAGNOSIS — Z79899 Other long term (current) drug therapy: Secondary | ICD-10-CM | POA: Diagnosis not present

## 2020-03-05 DIAGNOSIS — M19042 Primary osteoarthritis, left hand: Secondary | ICD-10-CM | POA: Diagnosis not present

## 2020-03-05 DIAGNOSIS — M130 Polyarthritis, unspecified: Secondary | ICD-10-CM | POA: Diagnosis not present

## 2020-03-05 DIAGNOSIS — M5030 Other cervical disc degeneration, unspecified cervical region: Secondary | ICD-10-CM | POA: Diagnosis not present

## 2020-03-05 DIAGNOSIS — D472 Monoclonal gammopathy: Secondary | ICD-10-CM | POA: Diagnosis not present

## 2020-03-05 DIAGNOSIS — M549 Dorsalgia, unspecified: Secondary | ICD-10-CM | POA: Diagnosis not present

## 2020-03-26 DIAGNOSIS — E119 Type 2 diabetes mellitus without complications: Secondary | ICD-10-CM | POA: Diagnosis not present

## 2020-03-26 DIAGNOSIS — M545 Low back pain, unspecified: Secondary | ICD-10-CM | POA: Diagnosis not present

## 2020-03-26 DIAGNOSIS — Z79899 Other long term (current) drug therapy: Secondary | ICD-10-CM | POA: Diagnosis not present

## 2020-03-26 DIAGNOSIS — Z9181 History of falling: Secondary | ICD-10-CM | POA: Diagnosis not present

## 2020-03-26 DIAGNOSIS — M797 Fibromyalgia: Secondary | ICD-10-CM | POA: Diagnosis not present

## 2020-04-01 ENCOUNTER — Ambulatory Visit: Payer: Medicare Other | Attending: Internal Medicine

## 2020-04-01 DIAGNOSIS — Z23 Encounter for immunization: Secondary | ICD-10-CM

## 2020-04-01 NOTE — Progress Notes (Signed)
   Covid-19 Vaccination Clinic  Name:  MOE BRIER    MRN: [id number] DOB: 1933-02-25  04/01/2020  Ms. Ecklund was observed post Covid-19 immunization for 30 minutes based on pre-vaccination screening without incident. She was provided with Vaccine Information Sheet and instruction to access the V-Safe system.   Ms. Oyster was instructed to call 911 with any severe reactions post vaccine: [name] Difficulty breathing  . Swelling of face and throat  . A fast heartbeat  . A bad rash all over body  . Dizziness and weakness

## 2020-04-11 ENCOUNTER — Inpatient Hospital Stay: Payer: Medicare Other | Admitting: Hematology

## 2020-04-11 ENCOUNTER — Inpatient Hospital Stay: Payer: Medicare Other

## 2020-04-12 NOTE — Progress Notes (Signed)
Julie [name]   [phone] [phone]   Clinic Follow up Note   Patient Care Team: [name], DO as PCP - General (Family Medicine) Valinda Party, MD as Referring Physician (Rheumatology) Rolm Bookbinder, MD as Consulting Physician (General Surgery) Truitt Merle, MD as Consulting Physician (Hematology) Delice Bison Charlestine Massed, NP as Nurse Practitioner (Hematology and Oncology)  Date of Service:  04/16/2020  CHIEF COMPLAINT: F/u of left breast cancer  SUMMARY OF ONCOLOGIC HISTORY: Oncology History Overview Note  Malignant neoplasm of upper outer quadrant of female breast [facility])   Staging form: Breast, AJCC 7th Edition     Pathologic stage from 11/14/2015: Stage IA (T1c, N0, cM0) - Signed by Truitt Merle, MD on 11/25/2015     Clinical stage from 11/20/2015: Stage IA (T1c, N0, M0) - Signed by Curt Bears, MD on 11/20/2015     Malignant neoplasm of upper outer quadrant of female breast ([place])  10/09/2015 Mammogram   Screening bilateral mammograms showed a possible left breast mass. Diagnostic mammogram and ultrasound showed a 7 x 7 x 7 mm mass in the left breast at 11:00 position.   10/22/2015 Receptors her2   ER negative, PR negative, HER-2 not amplified   10/22/2015 Initial Biopsy   Left breast needle core biopsy at the 1:00 position showed invasive ductal carcinoma.   10/22/2015 Initial Diagnosis   Malignant neoplasm of upper outer quadrant of female breast ([place])   11/14/2015 Surgery   Left breast lumpectomy and sentinel lymph node biopsy [name])   11/14/2015 Pathology Results   Left breast lumpectomy showed invasive ductal carcinoma, grade 3, 1.1 cm, resection margins were negative, sentinel lymph node biopsy showed benign breast parenchyma, lymph node tissue not identified. Repeated ER PR and HER-2 were all negative.   12/20/2015 - 03/13/2016 Chemotherapy   Weekly Taxol 80 mg/m x 2 cycles, then changed to Abraxane 100 mg/m for cycles  3-12 due to infusion reaction. Abraxane dose-reduced cycle #9, then again for cycles #11 & #12 due to neuropathy. [total 12 cycles chemo]    04/01/2016 - 05/11/2016 Radiation Therapy   Adjuvant breast radiation ([place]). Left breast: 50.4 Gy in 28 fractions   06/19/2016 Imaging   MR Lumbar Spine without contrast  IMPRESSION: 1. Diffuse lumbar spondylosis with discogenic and facet degenerative changes progressed from 2010 lumbar MRI. 2. Multilevel canal stenosis is mild-to-moderate at L2-3, moderate L3-4, and mild at L4-5. 3. Multiple levels of mild and moderate neural foraminal narrowing with severe foraminal narrowing at the right L3-4 and L4-5 levels.   11/12/2017 Imaging   11/12/2017 DEXA ASSESSMENT: The BMD measured at Forearm Radius 33% is 0.648 g/cm2 with a T-score of -2.7. This patient is considered osteoporotic according to [facility]) criteria.   11/12/2017 Mammogram   11/12/2017 Diagnostic Mammogram IMPRESSION: No mammographic evidence of malignancy in either breast, status post left lumpectomy.      CURRENT THERAPY:  Surveillance  INTERVAL HISTORY:  Julie [name] is here for a follow up of left breast cancer. She was last seen by me 4 months ago. She presents to the clinic with her daughter. She notes she has mild memory loss. She was seen by her neurologist who recommended more exercise and mental games. She has some pain in the past few days after sleeping on hard mattress. She notes buttock pain as well, which is intermittent and stable. She notes her appetite is adequate and weight is stable. She notes she is doing better on Prolia injection  initially with jaw aches.    REVIEW OF SYSTEMS:   Constitutional: Denies fevers, chills or abnormal weight loss Eyes: Denies blurriness of vision Ears, nose, mouth, throat, and face: Denies mucositis or sore throat Respiratory: Denies cough, dyspnea or wheezes Cardiovascular: Denies palpitation, chest  discomfort or lower extremity swelling Gastrointestinal:  Denies nausea, heartburn or change in bowel habits  Skin: Denies abnormal skin rashes Lymphatics: Denies new lymphadenopathy or easy bruising Neurological:Denies numbness, tingling or new weaknesses (+) decreased memory  Behavioral/Psych: Mood is stable, no new changes  All other systems were reviewed with the patient and are negative.  MEDICAL HISTORY:  Past Medical History:  Diagnosis Date  . Anemia   . Anxiety    takes Klonopin at bedtime  . Arthritis    takes Methotrexate weekly and Prednisone  . Blood transfusion    no abnormal reaction noted  . Cancer (HCC)    breast   . Cataracts, bilateral    immature  . Complication of anesthesia    wakes up during surgery  . Depression   . Diabetes mellitus without complication ([place])   . Dyspnea    with exertion since chemo   . Fibromyalgia    takes Lyrica daily  . GERD (gastroesophageal reflux disease)    takes Nexium daily  . Glaucoma   . Hard of hearing   . Heart murmur   . Histoplasmosis   . History of colon polyps   . History of gout   . History of hiatal hernia   . History of shingles   . Hyperlipidemia   . Hypertension    takes Amlodipine,Metoprolol,and Losartan daily  . Incontinence of bowel   . Interstitial cystitis   . Joint pain   . Low back pain   . Nocturia   . Peripheral neuropathy   . Peripheral neuropathy   . Personal history of chemotherapy   . Personal history of radiation therapy   . PONV (postoperative nausea and vomiting)   . Ulcer 1970   duodenal  . Urinary frequency   . Urinary urgency   . Weakness    numbness in both hands and feet    SURGICAL HISTORY: Past Surgical History:  Procedure Laterality Date  . ABDOMINAL HYSTERECTOMY    . accupuncture  3 yrs ago  . ANKLE FUSION Left 05/10/2014   Procedure: LEFT ANKLE ARTHRODESIS ;  Surgeon: Wylene Simmer, MD;  Location: [place];  Service: Orthopedics;  Laterality: Left;  . ANKLE SURGERY  Left   . arm surgery Left    radius  . bladder tacked     . BREAST LUMPECTOMY Left 10/2015  . CERVICAL FUSION    . CHOLECYSTECTOMY    . COLONOSCOPY    . CYSTOCELE REPAIR    . DILATION AND CURETTAGE OF UTERUS    . ESOPHAGOGASTRODUODENOSCOPY    . PORT A CATH REVISION N/A 12/27/2015   Procedure: PORT A CATH REVISION;  Surgeon: Rolm Bookbinder, MD;  Location: [place];  Service: General;  Laterality: N/A;  . PORT-A-CATH REMOVAL N/A 03/25/2016   Procedure: REMOVAL PORT-A-CATH;  Surgeon: Rolm Bookbinder, MD;  Location: [facility];  Service: General;  Laterality: N/A;  . PORTACATH PLACEMENT Right 12/27/2015   Procedure: INSERTION PORT-A-CATH WITH ULTRASOUND ;  Surgeon: Rolm Bookbinder, MD;  Location: [place];  Service: General;  Laterality: Right;  . RADIOACTIVE SEED GUIDED PARTIAL MASTECTOMY WITH AXILLARY SENTINEL LYMPH NODE BIOPSY Left 11/14/2015   Procedure: RADIOACTIVE SEED GUIDED LEFT BREAST  LUMPECTOMY WITH AXILLARY SENTINEL LYMPH NODE BIOPSY;  Surgeon: Emelia Loron, MD;  Location: [facility];  Service: General;  Laterality: Left;  RADIOACTIVE SEED GUIDED LEFT BREAST LUMPECTOMY WITH AXILLARY SENTINEL LYMPH NODE BIOPSY  . SALPINGOOPHORECTOMY Bilateral   . TOTAL KNEE ARTHROPLASTY Right 02/28/2015   Procedure: RIGHT TOTAL KNEE ARTHROPLASTY;  Surgeon: Samson Frederic, MD;  Location: [facility];  Service: Orthopedics;  Laterality: Right;  . UPPER GASTROINTESTINAL ENDOSCOPY    . VAGINAL HYSTERECTOMY      I have reviewed the social history and family history with the patient and they are unchanged from previous note.  ALLERGIES:  is allergic to codeine, other, and aspirin.  MEDICATIONS:  Current Outpatient Medications  Medication Sig Dispense Refill  . amitriptyline (ELAVIL) 50 MG tablet Take 50 mg by mouth at bedtime.    [name] amLODipine (NORVASC) 10 MG tablet Take 10 mg by mouth daily.    . cholecalciferol (VITAMIN D3) 25 MCG (1000 UNIT) tablet  Take 1,000 Units by mouth daily.    . diclofenac Sodium (VOLTAREN) 1 % GEL SMARTSIG:3 Inch(es) Topical 3 Times Daily PRN    . docusate sodium (COLACE) 100 MG capsule Take 1 capsule (100 mg total) by mouth every 12 (twelve) hours. (Patient not taking: Reported on 01/16/2020) 60 capsule 0  . folic acid (FOLVITE) 1 MG tablet Take 2 mg by mouth every morning.     [name] FREESTYLE LITE test strip TEST ONCE EVERY DAY (Patient not taking: Reported on 01/16/2020)  4  . hydroxychloroquine (PLAQUENIL) 200 MG tablet Take 200 mg by mouth daily.    [name] LIDOCAINE PAIN RELIEF 4 % PTCH Apply 1 patch topically 2 (two) times daily.    [name] lovastatin (MEVACOR) 40 MG tablet 1 (ONE) TABLET TAKE IN THE EVENING AFTER DINNER (Patient taking differently: Take 40 mg by mouth every evening. ) 90 tablet 1  . metoprolol succinate (TOPROL-XL) 50 MG 24 hr tablet Take 50 mg by mouth every evening.   2  . omeprazole (PRILOSEC) 40 MG capsule Take 40 mg by mouth daily.    [name] oxyCODONE (OXY IR/ROXICODONE) 5 MG immediate release tablet Take 5 mg by mouth 3 (three) times daily as needed.    . polyethylene glycol (MIRALAX / GLYCOLAX) 17 g packet Take 17 g by mouth daily.    . predniSONE (DELTASONE) 5 MG tablet Take 7.5 mg by mouth daily.    . predniSONE (STERAPRED UNI-PAK 21 TAB) 5 MG (21) TBPK tablet Take by mouth. (Patient not taking: Reported on 01/16/2020)    . pregabalin (LYRICA) 75 MG capsule Take 75-150 mg by mouth See admin instructions. 75 Mg in the Am and 150 MG at bedtime    . valsartan-hydrochlorothiazide (DIOVAN-HCT) 320-12.5 MG tablet Take 0.5 tablets by mouth daily.     No current facility-administered medications for this visit.    PHYSICAL EXAMINATION: ECOG PERFORMANCE STATUS: 1 - Symptomatic but completely ambulatory  Vitals:   04/16/20 1213  BP: (!) 153/84  Pulse: (!) 58  Resp: 17  Temp: (!) 96.7 F (35.9 C)  SpO2: 97%   Filed Weights   04/16/20 1213  Weight: 213 lb 4.8 oz (96.8 kg)      GENERAL:alert, no  distress and comfortable SKIN: skin color, texture, turgor are normal, no rashes or significant lesions EYES: normal, Conjunctiva are pink and non-injected, sclera clear  NECK: supple, thyroid normal size, non-tender, without nodularity LYMPH:  no palpable lymphadenopathy in the cervical, axillary  LUNGS: clear to auscultation and  percussion with normal breathing effort HEART: regular rate & rhythm and no murmurs and no lower extremity edema ABDOMEN:abdomen soft, non-tender and normal bowel sounds Musculoskeletal:no cyanosis of digits and no clubbing  NEURO: alert & oriented x 3 with fluent speech, no focal motor/sensory deficits BREAST: S/p left lumpectomy: Surgical incision healed well (+) 0.5cm nodule above her incision at 2:00 position at left breast, stable. Right Breast exam benign   LABORATORY DATA:  I have reviewed the data as listed CBC Latest Ref Rng & Units 04/16/2020 02/13/2020 02/01/2020  WBC 4.0 - 10.5 K/uL 4.3 4.1 4.2  Hemoglobin 12.0 - 15.0 g/dL 11.8(L) 11.3(L) 11.0(L)  Hematocrit 36 - 46 % 37.7 36.9 35.4(L)  Platelets 150 - 400 K/uL 216 220 198     CMP Latest Ref Rng & Units 04/16/2020 02/13/2020 02/01/2020  Glucose 70 - 99 mg/dL 58(L) 69(L) 109(H)  BUN 8 - 23 mg/dL 31(H) 21 25(H)  Creatinine 0.44 - 1.00 mg/dL 1.32(H) 1.16(H) 1.29(H)  Sodium 135 - 145 mmol/L 142 140 144  Potassium 3.5 - 5.1 mmol/L 4.2 5.2(H) 3.8  Chloride 98 - 111 mmol/L 109 107 108  CO2 22 - 32 mmol/L $RemoveB'27 27 29  'xFHHOAKx$ Calcium 8.9 - 10.3 mg/dL 8.5(L) 9.1 9.1  Total Protein 6.5 - 8.1 g/dL 6.9 7.1 6.8  Total Bilirubin 0.3 - 1.2 mg/dL 0.7 0.5 0.5  Alkaline Phos 38 - 126 U/L 56 80 84  AST 15 - 41 U/L $Remo'25 25 27  'GHSDe$ ALT 0 - 44 U/L $Remo'16 16 15      'rYcvt$ RADIOGRAPHIC STUDIES: I have personally reviewed the radiological images as listed and agreed with the findings in the report. No results found.   ASSESSMENT & PLAN:  Julie [name] is a 86 y.o. female with    1. Malignant or new present of upper outer quadrant of  left female breast, invasive ductal carcinoma, grade 3, pT1cN0M0, stage IA, ER-/PR-/HER2- -She was diagnosed in 09/2015. She is s/p left breast lumpectomy, adjuvant Taxol/Abraxane and adjuvant radiation. She is currently on Surveillance.  -From a breast cancer standpoint, she is clinically doing well. Lab reviewed, her CBC and CMP are within normal limits except 11.8, BG 58, BUN 31, Cr 1.32, Calcium 8.5. Her physical exam shows stable 0.5-1cm scar tissue of left breast incision and her 11/2019 mammogram was unremarkable. There is no clinical concern for recurrence. -Continue surveillance. Next mammogram in 11/2020.  -F/u in March 2022.  -She is up to date on Flu shot and had COVID booster vaccine.    2. MGUS  -Her M-protein has been around 0.8-0.9 since 2017  -No clinical concern for disease progression. Will continue follow up SPE/IFE, and light chain levels every 3-6 months. -Her 12/14/19 M protein remains stable at 0.8. Today's MM panel is still pending. Given her blood counts and kidney/liver functions are stable, will hold bone marrow biopsy for now.   3. Joint and muscular pain, Rheumatoid arthritis, lumbar spondylosis -She will continue follow up with rheumatologist Dr. Dossie Der.  -She has chronic low back pain from lumber spondylosis and sacrum. Her CT Lumbar spine from 09/07/19 showed increased spinal stenosis of her lumbar spine. -Pain is stable and controlled.     4. Anemia, intermittent   -Pt notes 2021 endoscopy by Dr Benson Norway. Last colonoscopy was in 6945.  -On Folic Acid. I also recommend she start prenatal vitamin.    5. HTN -Continue medications and continue to Follow with Dr. Maudie Mercury on this.    6. Short Term Memory  loss -She is seen by neurologist who recommends more exercise and mental games.   7. Osteoporosis -10/2017 DEXA shows osteoporosis with lowest T-score of -2.7 at Forearm radius.  -She is at high fracture risk due to her frequent falls.  -I started her on Prolia injections  q58months on 02/01/20. She tolerated well with jaw aches. She is currently on Vit D as well.  -Calcium I recommend she start Tums or calcium.     Plan -Lab, f/u and Prolia injection in March 2022    No problem-specific Assessment & Plan notes found for this encounter.   No orders of the defined types were placed in this encounter.  All questions were answered. The patient knows to call the clinic with any problems, questions or concerns. No barriers to learning was detected. The total time spent in the appointment was 30 minutes.     Truitt Merle, MD 04/16/2020   I, Joslyn Devon, am acting as scribe for Truitt Merle, MD.   I have reviewed the above documentation for accuracy and completeness, and I agree with the above.

## 2020-04-16 ENCOUNTER — Inpatient Hospital Stay (HOSPITAL_BASED_OUTPATIENT_CLINIC_OR_DEPARTMENT_OTHER): Payer: Medicare Other | Admitting: Hematology

## 2020-04-16 ENCOUNTER — Encounter: Payer: Self-pay | Admitting: Hematology

## 2020-04-16 ENCOUNTER — Inpatient Hospital Stay: Payer: Medicare Other | Attending: Hematology

## 2020-04-16 ENCOUNTER — Other Ambulatory Visit: Payer: Self-pay

## 2020-04-16 VITALS — BP 153/84 | HR 58 | Temp 96.7°F | Resp 17 | Ht 64.0 in | Wt 213.3 lb

## 2020-04-16 DIAGNOSIS — C50411 Malignant neoplasm of upper-outer quadrant of right female breast: Secondary | ICD-10-CM | POA: Diagnosis not present

## 2020-04-16 DIAGNOSIS — D649 Anemia, unspecified: Secondary | ICD-10-CM | POA: Insufficient documentation

## 2020-04-16 DIAGNOSIS — I10 Essential (primary) hypertension: Secondary | ICD-10-CM | POA: Diagnosis not present

## 2020-04-16 DIAGNOSIS — C50412 Malignant neoplasm of upper-outer quadrant of left female breast: Secondary | ICD-10-CM

## 2020-04-16 DIAGNOSIS — M810 Age-related osteoporosis without current pathological fracture: Secondary | ICD-10-CM | POA: Insufficient documentation

## 2020-04-16 DIAGNOSIS — D472 Monoclonal gammopathy: Secondary | ICD-10-CM | POA: Diagnosis not present

## 2020-04-16 DIAGNOSIS — Z853 Personal history of malignant neoplasm of breast: Secondary | ICD-10-CM | POA: Diagnosis not present

## 2020-04-16 DIAGNOSIS — Z171 Estrogen receptor negative status [ER-]: Secondary | ICD-10-CM

## 2020-04-16 LAB — COMPREHENSIVE METABOLIC PANEL
ALT: 16 U/L (ref 0–44)
AST: 25 U/L (ref 15–41)
Albumin: 3.6 g/dL (ref 3.5–5.0)
Alkaline Phosphatase: 56 U/L (ref 38–126)
Anion gap: 6 (ref 5–15)
BUN: 31 mg/dL — ABNORMAL HIGH (ref 8–23)
CO2: 27 mmol/L (ref 22–32)
Calcium: 8.5 mg/dL — ABNORMAL LOW (ref 8.9–10.3)
Chloride: 109 mmol/L (ref 98–111)
Creatinine, Ser: 1.32 mg/dL — ABNORMAL HIGH (ref 0.44–1.00)
GFR, Estimated: 40 mL/min — ABNORMAL LOW (ref >60)
Glucose, Bld: 58 mg/dL — ABNORMAL LOW (ref 70–99)
Potassium: 4.2 mmol/L (ref 3.5–5.1)
Sodium: 142 mmol/L (ref 135–145)
Total Bilirubin: 0.7 mg/dL (ref 0.3–1.2)
Total Protein: 6.9 g/dL (ref 6.5–8.1)

## 2020-04-16 LAB — CBC WITH DIFFERENTIAL/PLATELET
Abs Immature Granulocytes: 0.01 10*3/uL (ref 0.00–0.07)
Basophils Absolute: 0 10*3/uL (ref 0.0–0.1)
Basophils Relative: 1 %
Eosinophils Absolute: 0.1 10*3/uL (ref 0.0–0.5)
Eosinophils Relative: 2 %
HCT: 37.7 % (ref 36.0–46.0)
Hemoglobin: 11.8 g/dL — ABNORMAL LOW (ref 12.0–15.0)
Immature Granulocytes: 0 %
Lymphocytes Relative: 17 %
Lymphs Abs: 0.8 10*3/uL (ref 0.7–4.0)
MCH: 27.1 pg (ref 26.0–34.0)
MCHC: 31.3 g/dL (ref 30.0–36.0)
MCV: 86.5 fL (ref 80.0–100.0)
Monocytes Absolute: 0.5 10*3/uL (ref 0.1–1.0)
Monocytes Relative: 12 %
Neutro Abs: 2.9 10*3/uL (ref 1.7–7.7)
Neutrophils Relative %: 68 %
Platelets: 216 10*3/uL (ref 150–400)
RBC: 4.36 MIL/uL (ref 3.87–5.11)
RDW: 15.9 % — ABNORMAL HIGH (ref 11.5–15.5)
WBC: 4.3 10*3/uL (ref 4.0–10.5)
nRBC: 0 % (ref 0.0–0.2)

## 2020-04-17 ENCOUNTER — Telehealth: Payer: Self-pay | Admitting: Hematology

## 2020-04-17 LAB — MULTIPLE MYELOMA PANEL, SERUM
Albumin SerPl Elph-Mcnc: 3.5 g/dL (ref 2.9–4.4)
Albumin/Glob SerPl: 1.2 (ref 0.7–1.7)
Alpha 1: 0.2 g/dL (ref 0.0–0.4)
Alpha2 Glob SerPl Elph-Mcnc: 0.6 g/dL (ref 0.4–1.0)
B-Globulin SerPl Elph-Mcnc: 1.4 g/dL — ABNORMAL HIGH (ref 0.7–1.3)
Gamma Glob SerPl Elph-Mcnc: 0.8 g/dL (ref 0.4–1.8)
Globulin, Total: 3 g/dL (ref 2.2–3.9)
IgA: 990 mg/dL — ABNORMAL HIGH (ref 64–422)
IgG (Immunoglobin G), Serum: 817 mg/dL (ref 586–1602)
IgM (Immunoglobulin M), Srm: 47 mg/dL (ref 26–217)
M Protein SerPl Elph-Mcnc: 0.8 g/dL — ABNORMAL HIGH
Total Protein ELP: 6.5 g/dL (ref 6.0–8.5)

## 2020-04-17 LAB — KAPPA/LAMBDA LIGHT CHAINS
Kappa free light chain: 55.3 mg/L — ABNORMAL HIGH (ref 3.3–19.4)
Kappa, lambda light chain ratio: 4.13 — ABNORMAL HIGH (ref 0.26–1.65)
Lambda free light chains: 13.4 mg/L (ref 5.7–26.3)

## 2020-04-17 NOTE — Telephone Encounter (Signed)
Scheduled appts per 11/23 los. Pt's daughter confirmed appt date and time.

## 2020-04-19 ENCOUNTER — Telehealth: Payer: Self-pay

## 2020-04-19 NOTE — Telephone Encounter (Signed)
I spoke with [name].  I reviewed Dr Ernestina Penna comments and recommendations.  She verbalized understanding.

## 2020-04-19 NOTE — Telephone Encounter (Signed)
-----   Message from Truitt Merle, MD sent at 04/18/2020 11:17 AM EST ----- Please let pt or her daughter know her M protein and light chain level were stable, no concerns,continue monitoring. Thanks   Truitt Merle

## 2020-04-25 DIAGNOSIS — Z9181 History of falling: Secondary | ICD-10-CM | POA: Diagnosis not present

## 2020-04-25 DIAGNOSIS — M545 Low back pain, unspecified: Secondary | ICD-10-CM | POA: Diagnosis not present

## 2020-04-25 DIAGNOSIS — Z79899 Other long term (current) drug therapy: Secondary | ICD-10-CM | POA: Diagnosis not present

## 2020-04-25 DIAGNOSIS — M797 Fibromyalgia: Secondary | ICD-10-CM | POA: Diagnosis not present

## 2020-04-25 DIAGNOSIS — E119 Type 2 diabetes mellitus without complications: Secondary | ICD-10-CM | POA: Diagnosis not present

## 2020-04-30 DIAGNOSIS — Z20822 Contact with and (suspected) exposure to covid-19: Secondary | ICD-10-CM | POA: Diagnosis not present

## 2020-05-01 DIAGNOSIS — Z20822 Contact with and (suspected) exposure to covid-19: Secondary | ICD-10-CM | POA: Diagnosis not present

## 2020-05-14 DIAGNOSIS — N390 Urinary tract infection, site not specified: Secondary | ICD-10-CM | POA: Diagnosis not present

## 2020-05-14 DIAGNOSIS — R413 Other amnesia: Secondary | ICD-10-CM | POA: Diagnosis not present

## 2020-05-14 DIAGNOSIS — K5900 Constipation, unspecified: Secondary | ICD-10-CM | POA: Diagnosis not present

## 2020-05-14 DIAGNOSIS — M549 Dorsalgia, unspecified: Secondary | ICD-10-CM | POA: Diagnosis not present

## 2020-05-14 DIAGNOSIS — R8290 Unspecified abnormal findings in urine: Secondary | ICD-10-CM | POA: Diagnosis not present

## 2020-05-14 DIAGNOSIS — R143 Flatulence: Secondary | ICD-10-CM | POA: Diagnosis not present

## 2020-05-20 ENCOUNTER — Telehealth: Payer: Self-pay | Admitting: *Deleted

## 2020-05-20 DIAGNOSIS — K219 Gastro-esophageal reflux disease without esophagitis: Secondary | ICD-10-CM | POA: Diagnosis not present

## 2020-05-20 DIAGNOSIS — R1310 Dysphagia, unspecified: Secondary | ICD-10-CM | POA: Diagnosis not present

## 2020-05-20 DIAGNOSIS — K5900 Constipation, unspecified: Secondary | ICD-10-CM | POA: Diagnosis not present

## 2020-05-20 DIAGNOSIS — K2970 Gastritis, unspecified, without bleeding: Secondary | ICD-10-CM | POA: Diagnosis not present

## 2020-05-20 NOTE — Telephone Encounter (Signed)
Daughter called wanting to schedule a sooner appt for her mother. Currently scheduled for 06/17/20. I reviewed Dr. Zannie Cove schedule. There are no sooner appt. Confirmed they are on wait list. Advised I will send message to RN to let her know they would like to be called if Dr. Terrace Arabia has any sooner appt become available.

## 2020-05-20 NOTE — Telephone Encounter (Signed)
noted 

## 2020-05-28 DIAGNOSIS — M797 Fibromyalgia: Secondary | ICD-10-CM | POA: Diagnosis not present

## 2020-05-28 DIAGNOSIS — Z79899 Other long term (current) drug therapy: Secondary | ICD-10-CM | POA: Diagnosis not present

## 2020-05-28 DIAGNOSIS — Z9181 History of falling: Secondary | ICD-10-CM | POA: Diagnosis not present

## 2020-05-28 DIAGNOSIS — E119 Type 2 diabetes mellitus without complications: Secondary | ICD-10-CM | POA: Diagnosis not present

## 2020-05-28 DIAGNOSIS — M545 Low back pain, unspecified: Secondary | ICD-10-CM | POA: Diagnosis not present

## 2020-06-06 DIAGNOSIS — M545 Low back pain, unspecified: Secondary | ICD-10-CM | POA: Diagnosis not present

## 2020-06-08 DIAGNOSIS — M069 Rheumatoid arthritis, unspecified: Secondary | ICD-10-CM | POA: Diagnosis not present

## 2020-06-08 DIAGNOSIS — M25551 Pain in right hip: Secondary | ICD-10-CM | POA: Diagnosis not present

## 2020-06-08 DIAGNOSIS — M797 Fibromyalgia: Secondary | ICD-10-CM | POA: Diagnosis not present

## 2020-06-08 DIAGNOSIS — M5431 Sciatica, right side: Secondary | ICD-10-CM | POA: Diagnosis not present

## 2020-06-13 DIAGNOSIS — Z9189 Other specified personal risk factors, not elsewhere classified: Secondary | ICD-10-CM | POA: Diagnosis not present

## 2020-06-13 DIAGNOSIS — M7061 Trochanteric bursitis, right hip: Secondary | ICD-10-CM | POA: Diagnosis not present

## 2020-06-13 DIAGNOSIS — M545 Low back pain, unspecified: Secondary | ICD-10-CM | POA: Diagnosis not present

## 2020-06-17 ENCOUNTER — Telehealth: Payer: Self-pay | Admitting: Neurology

## 2020-06-17 ENCOUNTER — Encounter: Payer: Self-pay | Admitting: Neurology

## 2020-06-17 ENCOUNTER — Ambulatory Visit (INDEPENDENT_AMBULATORY_CARE_PROVIDER_SITE_OTHER): Payer: Medicare Other | Admitting: Neurology

## 2020-06-17 VITALS — BP 153/72 | HR 62 | Ht 64.0 in | Wt 217.0 lb

## 2020-06-17 DIAGNOSIS — I679 Cerebrovascular disease, unspecified: Secondary | ICD-10-CM

## 2020-06-17 DIAGNOSIS — G3281 Cerebellar ataxia in diseases classified elsewhere: Secondary | ICD-10-CM

## 2020-06-17 DIAGNOSIS — R269 Unspecified abnormalities of gait and mobility: Secondary | ICD-10-CM | POA: Diagnosis not present

## 2020-06-17 DIAGNOSIS — K2100 Gastro-esophageal reflux disease with esophagitis, without bleeding: Secondary | ICD-10-CM | POA: Diagnosis not present

## 2020-06-17 MED ORDER — ALPRAZOLAM 1 MG PO TABS: ORAL_TABLET | ORAL | 0 refills | Status: DC

## 2020-06-17 NOTE — Progress Notes (Unsigned)
PATIENT: Julie [name] DOB: 27-Nov-1932  Chief Complaint  Patient presents with  . Follow-up    MMSE - animals. She is here with her daughter [name], for worsening memory.     HISTORICAL  Julie [name] is a 87 year old female, seen in request by her primary care physician Dr. [name], [place], for evaluation of bilateral hands and feet paresthesia, initial evaluation was on January 09, 2019.  I have reviewed and summarized the referring note from the referring physician.   Past medical history of hypertension, hyperlipidemia, left breast cancer, status post left lobectomy, chemotherapy, in 2017, during that time, she developed bilateral fingertips and feet paresthesia, also had a history of chronic low back pain, carries a diagnosis of lumbar stenosis, has gradual onset gait abnormality  Over the past 3 years, she continue to experience worsening bilateral fingertips and feet paresthesia, numbness tingling stay at the bottom of her feet, and toes, she also complains of worsening low back pain, radiating pain to bilateral lower extremities, last week while driving, she described difficulty moving her right leg off the paddle, she also complains of bilateral hands tremor, difficulty holding on small object  Hospital admission in July 2020 for generalized whole body achy pain, weakness, there was also described episode of staring spells at emergency room,  Echocardiogram December 14, 2018: Ejection fraction 60 to 65%, cavity size was normal, mild increased left ventricular wall thickness  MRI of the brain without contrast on March 13, 2018: No acute abnormality, moderate supratentorium small vessel disease  MRI of lumbar in January 2018, multilevel degenerative disc disease, mild to moderate canal stenosis at L2-3, L3-4, mild at L4-5, with variable degree of neural foraminal narrowing, severe neural foraminal narrowing at right L3-4, L4-5,  MRI cervical spine September 2020: Multilevel  degenerative changes, evidence of anterior cervical discectomy and fusion at C 4, 5, 6  Laboratory evaluation in 2020 showed normal TSH, CBC, CMP showed mild elevated glucose 115, normal CPK, negative HIV, RPR, A1c of 6.0, mild elevated M spike 0.8 DL on protein electrophoresis, normal B12, C-reactive protein,  EMG nerve conduction study in August 2020 showed no large fiber peripheral neuropathy  UPDATE December 14 2019: She was seen in 2020 for evaluation of intermittent bilateral upper and lower extremity paresthesia, chronic low back pain, gait abnormality, she was referred for physical therapy, which has helped her, but never carried through the water aerobic as suggested  She spent most of the time watching TV, different game show, not physically active, since 2020, she developed gradual onset more noticeable intermittent body shaking, also complains of generalized weakness, weak handgrip, using both hands to hold a cup,  She is currently under pain management, taking Lyrica, also oxycodone, she had recent flareup of her arthritis pain, was treated with low dose of steroid  She now has caregiver coming every other day, spent a few hours with her, she enjoying the trip to different shops,  She also has mild memory loss, word finding difficulties, denies family history of dementia, is on polypharmacy treatment, including oxycodone 5 mg 3 times a day, Lyrica 75 mg  UPDATE Jun 17 2020: She is accompanied by her daughter at today's clinical visit, she complains of constant multiple joints pain, was seen by rheumatologist, taking prednisone 7.5 mg daily, also Lyrica 75 mg 3 times a day, sleep well, has good appetite, also take frequent naps during the day  Today her main concern is slow worsening memory loss, left stove on,  no longer feel comfortable using sharp knives, has caregiver coming with her few times each week, family do not feel safe to leave her alone, occasionally agitated, increased urine  urinary tract infection, improve afterwards.  NoW daughter is managing her medications now  REVIEW OF SYSTEMS: Full 14 system review of systems performed and notable only for as above All other review of systems were negative.  ALLERGIES: Allergies  Allergen Reactions  . Codeine Shortness Of Breath  . Other     Walnuts-anaphylaxis (patient denies allergy states that she just does not eat WALNUTS)  . Aspirin Other (See Comments)    Duodenal ulcer- cannot take aspirin when active.  HYPERSENSITIVE  TO  REGULAR DOSE.      HOME MEDICATIONS: Current Outpatient Medications  Medication Sig Dispense Refill  . amitriptyline (ELAVIL) 50 MG tablet Take 50 mg by mouth at bedtime.    [name] amLODipine (NORVASC) 10 MG tablet Take 10 mg by mouth daily.    . cholecalciferol (VITAMIN D3) 25 MCG (1000 UNIT) tablet Take 1,000 Units by mouth daily.    . diclofenac Sodium (VOLTAREN) 1 % GEL SMARTSIG:3 Inch(es) Topical 3 Times Daily PRN    . folic acid (FOLVITE) 1 MG tablet Take 2 mg by mouth every morning.    . hydroxychloroquine (PLAQUENIL) 200 MG tablet Take 200 mg by mouth daily.    [name] LIDOCAINE PAIN RELIEF 4 % PTCH Apply 1 patch topically 2 (two) times daily.    [name] lovastatin (MEVACOR) 40 MG tablet 1 (ONE) TABLET TAKE IN THE EVENING AFTER DINNER (Patient taking differently: Take 40 mg by mouth every evening.) 90 tablet 1  . metoprolol succinate (TOPROL-XL) 50 MG 24 hr tablet Take 50 mg by mouth every evening.   2  . omeprazole (PRILOSEC) 40 MG capsule Take 40 mg by mouth daily.    [name] oxyCODONE (OXY IR/ROXICODONE) 5 MG immediate release tablet Take 5 mg by mouth 3 (three) times daily as needed.    . polyethylene glycol (MIRALAX / GLYCOLAX) 17 g packet Take 17 g by mouth daily.    . predniSONE (DELTASONE) 5 MG tablet Take 7.5 mg by mouth daily.    . pregabalin (LYRICA) 75 MG capsule Take 75-150 mg by mouth See admin instructions. 75 Mg in the Am and 150 MG at bedtime    . valsartan-hydrochlorothiazide  (DIOVAN-HCT) 320-12.5 MG tablet Take 0.5 tablets by mouth daily.     No current facility-administered medications for this visit.    PAST MEDICAL HISTORY: Past Medical History:  Diagnosis Date  . Anemia   . Anxiety    takes Klonopin at bedtime  . Arthritis    takes Methotrexate weekly and Prednisone  . Blood transfusion    no abnormal reaction noted  . Cancer (HCC)    breast   . Cataracts, bilateral    immature  . Complication of anesthesia    wakes up during surgery  . Depression   . Diabetes mellitus without complication ([place])   . Dyspnea    with exertion since chemo   . Fibromyalgia    takes Lyrica daily  . GERD (gastroesophageal reflux disease)    takes Nexium daily  . Glaucoma   . Hard of hearing   . Heart murmur   . Histoplasmosis   . History of colon polyps   . History of gout   . History of hiatal hernia   . History of shingles   . Hyperlipidemia   . Hypertension  takes Amlodipine,Metoprolol,and Losartan daily  . Incontinence of bowel   . Interstitial cystitis   . Joint pain   . Low back pain   . Nocturia   . Peripheral neuropathy   . Peripheral neuropathy   . Personal history of chemotherapy   . Personal history of radiation therapy   . PONV (postoperative nausea and vomiting)   . Ulcer 1970   duodenal  . Urinary frequency   . Urinary urgency   . Weakness    numbness in both hands and feet    PAST SURGICAL HISTORY: Past Surgical History:  Procedure Laterality Date  . ABDOMINAL HYSTERECTOMY    . accupuncture  3 yrs ago  . ANKLE FUSION Left 05/10/2014   Procedure: LEFT ANKLE ARTHRODESIS ;  Surgeon: Wylene Simmer, MD;  Location: [facility];  Service: Orthopedics;  Laterality: Left;  . ANKLE SURGERY Left   . arm surgery Left    radius  . bladder tacked     . BREAST LUMPECTOMY Left 10/2015  . CERVICAL FUSION    . CHOLECYSTECTOMY    . COLONOSCOPY    . CYSTOCELE REPAIR    . DILATION AND CURETTAGE OF UTERUS    . ESOPHAGOGASTRODUODENOSCOPY    .  PORT A CATH REVISION N/A 12/27/2015   Procedure: PORT A CATH REVISION;  Surgeon: Rolm Bookbinder, MD;  Location: [facility];  Service: General;  Laterality: N/A;  . PORT-A-CATH REMOVAL N/A 03/25/2016   Procedure: REMOVAL PORT-A-CATH;  Surgeon: Rolm Bookbinder, MD;  Location: [facility];  Service: General;  Laterality: N/A;  . PORTACATH PLACEMENT Right 12/27/2015   Procedure: INSERTION PORT-A-CATH WITH ULTRASOUND ;  Surgeon: Rolm Bookbinder, MD;  Location: [facility];  Service: General;  Laterality: Right;  . RADIOACTIVE SEED GUIDED PARTIAL MASTECTOMY WITH AXILLARY SENTINEL LYMPH NODE BIOPSY Left 11/14/2015   Procedure: RADIOACTIVE SEED GUIDED LEFT BREAST LUMPECTOMY WITH AXILLARY SENTINEL LYMPH NODE BIOPSY;  Surgeon: Rolm Bookbinder, MD;  Location: [facility];  Service: General;  Laterality: Left;  RADIOACTIVE SEED GUIDED LEFT BREAST LUMPECTOMY WITH AXILLARY SENTINEL LYMPH NODE BIOPSY  . SALPINGOOPHORECTOMY Bilateral   . TOTAL KNEE ARTHROPLASTY Right 02/28/2015   Procedure: RIGHT TOTAL KNEE ARTHROPLASTY;  Surgeon: Rod Can, MD;  Location: [facility];  Service: Orthopedics;  Laterality: Right;  . UPPER GASTROINTESTINAL ENDOSCOPY    . VAGINAL HYSTERECTOMY      FAMILY HISTORY: Family History  Problem Relation Age of Onset  . Colon cancer Sister 61  . Esophageal cancer Brother 32  . Breast cancer Mother   . Brain cancer Other   . Other Father        cerebral hemorrhage    SOCIAL HISTORY: Social History   Socioeconomic History  . Marital status: Divorced    Spouse name: Not on file  . Number of children: 5  . Years of education: some college  . Highest education level: Not on file  Occupational History  . Occupation: Retired  Tobacco Use  . Smoking status: Never Smoker  . Smokeless tobacco: Never Used  Vaping Use  . Vaping Use: Never used  Substance and Sexual Activity  . Alcohol use: No    Comment: IN [facility] ONLY  . Drug use: No  .  Sexual activity: Not on file  Other Topics Concern  . Not on file  Social History Narrative   Lives at home with her daughter.   Right-handed.   1 cup caffeine per day.   Social Determinants of Health  Financial Resource Strain: Not on file  Food Insecurity: Not on file  Transportation Needs: Not on file  Physical Activity: Not on file  Stress: Not on file  Social Connections: Not on file  Intimate Partner Violence: Not on file     PHYSICAL EXAM  BP 137/76, HR 59, weight 214, 5'4  PHYSICAL EXAMNIATION:  Gen: NAD, conversant, well nourised, obese, well groomed                     Cardiovascular: Regular rate rhythm, no peripheral edema, warm, nontender. Eyes: Conjunctivae clear without exudates or hemorrhage Neck: Supple, no carotid bruits. Pulmonary: Clear to auscultation bilaterally   NEUROLOGICAL EXAM:  MENTAL STATUS: MMSE - Mini Mental State Exam 06/17/2020 12/14/2019  Orientation to time 5 4  Orientation to Place 5 5  Registration 3 1  Attention/ Calculation 5 3  Recall 1 3  Language- name 2 objects 2 2  Language- repeat 1 1  Language- follow 3 step command 3 2  Language- read & follow direction 1 1  Write a sentence 1 1  Copy design 0 0  Total score 27 23     CRANIAL NERVES: CN II: Visual fields are full to confrontation  Pupils are round equal and briskly reactive to light. CN III, IV, VI: extraocular movement are normal. No ptosis. CN V: Facial sensation is intact to pinprick in all 3 divisions bilaterally. Corneal responses are intact.  CN VII: Face is symmetric with normal eye closure and smile. CN VIII: Hearing is normal to casual conversation bilaterally CN IX, X: Palate elevates symmetrically. Phonation is normal. CN XI: Head turning and shoulder shrug are intact CN XII: Tongue is midline with normal movements and no atrophy.  MOTOR: There is no pronator drift of out-stretched arms. Muscle bulk and tone are normal. Muscle strength is  normal.  REFLEXES: Reflexes are 1 and symmetric at the biceps, triceps, knees, and ankles. Plantar responses are flexor.  SENSORY: Length dependent decreased to light touch, and vibratory sensation to ankle level  COORDINATION: Rapid alternating movements and fine finger movements are intact. There is no dysmetria on finger-to-nose and heel-knee-shin.    GAIT/STANCE: She needs push-up to get up from seated position, antalgic, unsteady  DIAGNOSTIC DATA (LABS, IMAGING, TESTING) - I reviewed patient records, labs, notes, testing and imaging myself where available.   ASSESSMENT AND PLAN  CATALEAH BERON is a 87 y.o. female   Worsening cognitive impairment Gait abnormalities Cerebrovascular disease  Her complaints of memory loss are likely due to combination of aging, central nervous system degenerative disorder, with superimposed vascular component, medication side effect  MRI of brain  Referral to physical therapy  Marcial Pacas, M.D. Ph.D.  [facility] [address] [place], [place] [address] Ph: [phone] Fax: [phone]  CC: [name], DO

## 2020-06-17 NOTE — Telephone Encounter (Signed)
Medicare/aetna order sent to [facility]. They will obtain the auth for aetna and reach out to the patient to schedule.  

## 2020-06-18 DIAGNOSIS — M5459 Other low back pain: Secondary | ICD-10-CM | POA: Diagnosis not present

## 2020-06-18 DIAGNOSIS — M542 Cervicalgia: Secondary | ICD-10-CM | POA: Diagnosis not present

## 2020-06-20 DIAGNOSIS — M810 Age-related osteoporosis without current pathological fracture: Secondary | ICD-10-CM | POA: Diagnosis not present

## 2020-06-20 DIAGNOSIS — D649 Anemia, unspecified: Secondary | ICD-10-CM | POA: Diagnosis not present

## 2020-06-20 DIAGNOSIS — E119 Type 2 diabetes mellitus without complications: Secondary | ICD-10-CM | POA: Diagnosis not present

## 2020-06-20 DIAGNOSIS — E785 Hyperlipidemia, unspecified: Secondary | ICD-10-CM | POA: Diagnosis not present

## 2020-06-20 DIAGNOSIS — I10 Essential (primary) hypertension: Secondary | ICD-10-CM | POA: Diagnosis not present

## 2020-06-25 DIAGNOSIS — E119 Type 2 diabetes mellitus without complications: Secondary | ICD-10-CM | POA: Diagnosis not present

## 2020-06-25 DIAGNOSIS — M545 Low back pain, unspecified: Secondary | ICD-10-CM | POA: Diagnosis not present

## 2020-06-25 DIAGNOSIS — Z9181 History of falling: Secondary | ICD-10-CM | POA: Diagnosis not present

## 2020-06-25 DIAGNOSIS — Z79899 Other long term (current) drug therapy: Secondary | ICD-10-CM | POA: Diagnosis not present

## 2020-06-25 DIAGNOSIS — M797 Fibromyalgia: Secondary | ICD-10-CM | POA: Diagnosis not present

## 2020-06-27 ENCOUNTER — Other Ambulatory Visit: Payer: Medicare Other

## 2020-06-27 DIAGNOSIS — M0579 Rheumatoid arthritis with rheumatoid factor of multiple sites without organ or systems involvement: Secondary | ICD-10-CM | POA: Diagnosis not present

## 2020-06-27 DIAGNOSIS — M5030 Other cervical disc degeneration, unspecified cervical region: Secondary | ICD-10-CM | POA: Diagnosis not present

## 2020-06-27 DIAGNOSIS — Z853 Personal history of malignant neoplasm of breast: Secondary | ICD-10-CM | POA: Diagnosis not present

## 2020-06-27 DIAGNOSIS — E785 Hyperlipidemia, unspecified: Secondary | ICD-10-CM | POA: Diagnosis not present

## 2020-06-27 DIAGNOSIS — M4800 Spinal stenosis, site unspecified: Secondary | ICD-10-CM | POA: Diagnosis not present

## 2020-06-27 DIAGNOSIS — I10 Essential (primary) hypertension: Secondary | ICD-10-CM | POA: Diagnosis not present

## 2020-06-27 DIAGNOSIS — H6123 Impacted cerumen, bilateral: Secondary | ICD-10-CM | POA: Diagnosis not present

## 2020-06-27 DIAGNOSIS — E119 Type 2 diabetes mellitus without complications: Secondary | ICD-10-CM | POA: Diagnosis not present

## 2020-06-27 DIAGNOSIS — K219 Gastro-esophageal reflux disease without esophagitis: Secondary | ICD-10-CM | POA: Diagnosis not present

## 2020-06-27 DIAGNOSIS — D649 Anemia, unspecified: Secondary | ICD-10-CM | POA: Diagnosis not present

## 2020-06-27 DIAGNOSIS — Z0000 Encounter for general adult medical examination without abnormal findings: Secondary | ICD-10-CM | POA: Diagnosis not present

## 2020-06-27 DIAGNOSIS — Z23 Encounter for immunization: Secondary | ICD-10-CM | POA: Diagnosis not present

## 2020-07-01 ENCOUNTER — Ambulatory Visit: Payer: Medicare Other | Attending: Neurology

## 2020-07-01 ENCOUNTER — Other Ambulatory Visit: Payer: Self-pay

## 2020-07-01 DIAGNOSIS — M25551 Pain in right hip: Secondary | ICD-10-CM | POA: Insufficient documentation

## 2020-07-01 DIAGNOSIS — M545 Low back pain, unspecified: Secondary | ICD-10-CM | POA: Diagnosis not present

## 2020-07-01 DIAGNOSIS — R2681 Unsteadiness on feet: Secondary | ICD-10-CM | POA: Diagnosis not present

## 2020-07-01 DIAGNOSIS — R269 Unspecified abnormalities of gait and mobility: Secondary | ICD-10-CM | POA: Insufficient documentation

## 2020-07-01 DIAGNOSIS — G8929 Other chronic pain: Secondary | ICD-10-CM | POA: Insufficient documentation

## 2020-07-01 DIAGNOSIS — R2689 Other abnormalities of gait and mobility: Secondary | ICD-10-CM | POA: Diagnosis not present

## 2020-07-01 DIAGNOSIS — M6281 Muscle weakness (generalized): Secondary | ICD-10-CM | POA: Diagnosis not present
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
## 2020-07-01 NOTE — Patient Instructions (Signed)
Access Code: [email or web address] URL: [email or web address] Date: 07/01/2020 Prepared by: Sherlynn Stalls  Exercises Seated Hip Abduction with Resistance - 1 x daily - 5 x weekly - 3 sets - 10 reps Seated March - 1 x daily - 5 x weekly - 3 sets - 10 reps Seated Lumbar Flexion Stretch - 1 x daily - 5 x weekly - 3 sets

## 2020-07-08 ENCOUNTER — Ambulatory Visit: Payer: Medicare Other | Admitting: Physical Therapy

## 2020-07-08 ENCOUNTER — Other Ambulatory Visit: Payer: Self-pay

## 2020-07-08 ENCOUNTER — Encounter: Payer: Self-pay | Admitting: Physical Therapy

## 2020-07-08 DIAGNOSIS — R2681 Unsteadiness on feet: Secondary | ICD-10-CM

## 2020-07-08 DIAGNOSIS — R269 Unspecified abnormalities of gait and mobility: Secondary | ICD-10-CM | POA: Diagnosis not present

## 2020-07-08 DIAGNOSIS — M545 Low back pain, unspecified: Secondary | ICD-10-CM | POA: Diagnosis not present

## 2020-07-08 DIAGNOSIS — G8929 Other chronic pain: Secondary | ICD-10-CM | POA: Diagnosis not present

## 2020-07-08 DIAGNOSIS — R2689 Other abnormalities of gait and mobility: Secondary | ICD-10-CM

## 2020-07-08 DIAGNOSIS — M6281 Muscle weakness (generalized): Secondary | ICD-10-CM

## 2020-07-08 NOTE — Therapy (Signed)
[place]. [place], [name], [address] Phone: [phone]   Fax:  [phone]  Physical Therapy Treatment  Patient Details  Name: Julie [name] MRN: [id number] Date of Birth: 1932/06/07 Referring Provider (PT): Dr. Marcial Pacas, MD   Encounter Date: 07/08/2020    Past Medical History:  Diagnosis Date   Anemia    Anxiety    takes Klonopin at bedtime   Arthritis    takes Methotrexate weekly and Prednisone   Blood transfusion    no abnormal reaction noted   Cancer (HCC)    breast    Cataracts, bilateral    immature   Complication of anesthesia    wakes up during surgery   Depression    Diabetes mellitus without complication (HCC)    Dyspnea    with exertion since chemo    Fibromyalgia    takes Lyrica daily   GERD (gastroesophageal reflux disease)    takes Nexium daily   Glaucoma    Hard of hearing    Heart murmur    Histoplasmosis    History of colon polyps    History of gout    History of hiatal hernia    History of shingles    Hyperlipidemia    Hypertension    takes Amlodipine,Metoprolol,and Losartan daily   Incontinence of bowel    Interstitial cystitis    Joint pain    Low back pain    Nocturia    Peripheral neuropathy    Peripheral neuropathy    Personal history of chemotherapy    Personal history of radiation therapy    PONV (postoperative nausea and vomiting)    Ulcer 1970   duodenal   Urinary frequency    Urinary urgency    Weakness    numbness in both hands and feet    Past Surgical History:  Procedure Laterality Date   ABDOMINAL HYSTERECTOMY     accupuncture  3 yrs ago   ANKLE FUSION Left 05/10/2014   Procedure: LEFT ANKLE ARTHRODESIS ;  Surgeon: Wylene Simmer, MD;  Location: [facility];  Service: Orthopedics;  Laterality: Left;   ANKLE SURGERY Left    arm surgery Left    radius   bladder tacked      BREAST LUMPECTOMY Left 10/2015    CERVICAL FUSION     CHOLECYSTECTOMY     COLONOSCOPY     CYSTOCELE REPAIR     DILATION AND CURETTAGE OF UTERUS     ESOPHAGOGASTRODUODENOSCOPY     PORT A CATH REVISION N/A 12/27/2015   Procedure: PORT A CATH REVISION;  Surgeon: Rolm Bookbinder, MD;  Location: [facility];  Service: General;  Laterality: N/A;   PORT-A-CATH REMOVAL N/A 03/25/2016   Procedure: REMOVAL PORT-A-CATH;  Surgeon: Rolm Bookbinder, MD;  [facility];  Service: General;  Laterality: N/A;   PORTACATH PLACEMENT Right 12/27/2015   Procedure: INSERTION PORT-A-CATH WITH ULTRASOUND ;  Surgeon: Rolm Bookbinder, MD;  Location: [facility];  Service: General;  Laterality: Right;   RADIOACTIVE SEED GUIDED PARTIAL MASTECTOMY WITH AXILLARY SENTINEL LYMPH NODE BIOPSY Left 11/14/2015   Procedure: RADIOACTIVE SEED GUIDED LEFT BREAST LUMPECTOMY WITH AXILLARY SENTINEL LYMPH NODE BIOPSY;  Surgeon: Rolm Bookbinder, MD;  Location: [facility];  Service: General;  Laterality: Left;  RADIOACTIVE SEED GUIDED LEFT BREAST LUMPECTOMY WITH AXILLARY SENTINEL LYMPH NODE BIOPSY   SALPINGOOPHORECTOMY Bilateral    TOTAL KNEE ARTHROPLASTY Right 02/28/2015   Procedure: RIGHT TOTAL KNEE  ARTHROPLASTY;  Surgeon: Rod Can, MD;  [facility];  Service: Orthopedics;  Laterality: Right;   UPPER GASTROINTESTINAL ENDOSCOPY     VAGINAL HYSTERECTOMY      There were no vitals filed for this visit.   Subjective Assessment - 07/08/20 1103    Subjective "Im am ok" No falls or stumbles    Currently in Pain? No/denies                             [facility] Adult PT Treatment/Exercise - 07/08/20 0001      Exercises   Exercises Lumbar;Knee/Hip      Lumbar Exercises: Aerobic   Recumbent Bike L1 x 5 min    Nustep L4 x 3 min      Lumbar Exercises: Standing   Other Standing Lumbar Exercises Standing marches SPC 2x10      Lumbar Exercises: Seated   Sit to Stand 5 reps   x2      Knee/Hip Exercises: Standing   Other Standing Knee Exercises Hip And HHA x2 2x5 each    Other Standing Knee Exercises Alt 5 in box taps with SPC      Knee/Hip Exercises: Seated   Long Arc Quad Both;2 sets;10 reps    Long Arc Quad Weight 2 lbs.    Marching Strengthening;Both;2 sets;10 reps    Marching Weights 2 lbs.    Hamstring Curl Strengthening;Both;2 sets;10 reps    Hamstring Limitations Green Tband                    PT Short Term Goals - 07/08/20 1142      PT SHORT TERM GOAL #1   Title Pt and caregiver  will be independent with inital HEP    Status Partially Met             PT Long Term Goals - 07/01/20 1226      PT LONG TERM GOAL #1   Title Pt/caregiver will be independent with advance HEP.    Time 8    Period Weeks    Status New    Target Date 08/26/20      PT LONG TERM GOAL #2   Title Pt will score at least 50/56 to demonstrate decreased falls risk.    Time 8    Period Weeks    Status New    Target Date 08/26/20      PT LONG TERM GOAL #3   Title Pt will report decreased radiating LBP to </= 2/10    Time 8    Period Weeks    Status New    Target Date 08/26/20      PT LONG TERM GOAL #4   Title Pt will amb.300' with RW (or rollator) with supervision and no LOB for incr. community accessibility.    Time 8    Period Weeks    Status New    Target Date 08/26/20      PT LONG TERM GOAL #5   Title Pt will demo improved SLS to at least 15" each leg to facilitate stability during gait and stair negotiaiton.    Time 8    Period Weeks    Status New    Target Date 08/26/20                 Plan - 07/08/20 1142    Clinical Impression Statement Pt tolerated an initial progression to TE well. Pt can  be HOH needing clear slow instructions. Cues needed for pacing not to do interventions too fast. She reports an increase in LBP with alt box taps but did not report this increase with standing marches. Cues for anterior weight shift not to allow  LEs to push against table with sit to stands.    Personal Factors and Comorbidities Comorbidity 2;Fitness;Past/Current Experience;Time since onset of injury/illness/exacerbation;Transportation    Comorbidities fibromyalgia, peripheral neuropathy, h/o Lt breast cancer, OA knees, lumbar stenosis, cervical fusion    Examination-Activity Limitations Locomotion Level;Squat;Stairs;Bend;Stand    Examination-Participation Restrictions Meal Prep;Cleaning;Shop;Community Activity;Laundry    Stability/Clinical Decision Making Evolving/Moderate complexity    Rehab Potential Good    PT Frequency 2x / week    PT Duration 8 weeks    PT Treatment/Interventions ADLs/Self Care Home Management;Electrical Stimulation;Cryotherapy;Moist Heat;DME Instruction;Neuromuscular re-education;Balance training;Therapeutic activities;Therapeutic exercise;Functional mobility training;Stair training;Gait training;Patient/family education;Manual techniques;Energy conservation;Passive range of motion    PT Next Visit Plan Progressive strength and balance as tolerated. Monitor VS as needed. Short, clear instructions.           Patient will benefit from skilled therapeutic intervention in order to improve the following deficits and impairments:  Difficulty walking,Decreased balance,Decreased strength,Pain,Impaired sensation,Abnormal gait,Increased muscle spasms,Decreased endurance,Improper body mechanics,Postural dysfunction,Decreased mobility,Decreased activity tolerance  Visit Diagnosis: Unsteadiness on feet  Muscle weakness (generalized)  Other abnormalities of gait and mobility  Gait abnormality  Chronic low back pain, unspecified back pain laterality, unspecified whether sciatica present     Problem List Patient Active Problem List   Diagnosis Date Noted   Cerebellar ataxia in diseases classified elsewhere ([place]) 06/17/2020   Mild cognitive impairment 12/14/2019   Cerebral vascular disease 12/14/2019   Gait  abnormality 01/09/2019   Paresthesia 01/09/2019   Near syncope    Weakness    Essential hypertension    Generalized weakness 12/13/2018   Syncope 12/13/2018   Peripheral neuropathy 06/08/2016   Low back pain without sciatica 06/08/2016   Abnormality of gait 06/08/2016   Anemia due to antineoplastic chemotherapy 03/06/2016   Hypersensitivity reaction 01/07/2016   Port catheter in place 01/03/2016   Malignant neoplasm of upper outer quadrant of female breast ([place]) 10/30/2015   Primary osteoarthritis of right knee 02/28/2015   Post-traumatic arthritis of ankle 05/10/2014   MGUS (monoclonal gammopathy of unknown significance) 02/17/2012   Diverticulosis of colon (without mention of hemorrhage) 01/21/2011   Family history of malignant neoplasm of gastrointestinal tract 01/21/2011   Special screening for malignant neoplasms, colon 01/21/2011   Other symptoms involving digestive system(787.99) 01/21/2011    [place]ot Jun, 07/08/2020, 11:45 AM  [facility]. [place], [name], [address] Phone: [phone]   Fax:  [phone]  Name: Julie [name] MRN: [id number] Date of Birth: Oct 15, 1932

## 2020-07-09 ENCOUNTER — Ambulatory Visit
Admission: RE | Admit: 2020-07-09 | Discharge: 2020-07-09 | Disposition: A | Discharge status: Home / Self Care | Payer: Medicare Other | Source: Ambulatory Visit | Attending: Neurology | Admitting: Neurology

## 2020-07-09 DIAGNOSIS — I679 Cerebrovascular disease, unspecified: Secondary | ICD-10-CM

## 2020-07-09 DIAGNOSIS — R269 Unspecified abnormalities of gait and mobility: Secondary | ICD-10-CM

## 2020-07-09 DIAGNOSIS — G3281 Cerebellar ataxia in diseases classified elsewhere: Secondary | ICD-10-CM

## 2020-07-10 ENCOUNTER — Ambulatory Visit: Payer: Medicare Other | Admitting: Physical Therapy

## 2020-07-10 ENCOUNTER — Telehealth: Payer: Self-pay | Admitting: Neurology

## 2020-07-10 NOTE — Telephone Encounter (Signed)
  IMPRESSION:   MRI brain (without) demonstrating: -Stable mild atrophy and moderate chronic small vessel ischemic disease. -No acute findings.   Please call patient, MRI of the brain showed no acute abnormality, stable atrophy, moderate small vessel disease.

## 2020-07-10 NOTE — Telephone Encounter (Signed)
I called and left a detailed message with results, ok per DPR. I advised patient to call us back if she has any questions or concerns.

## 2020-07-12 ENCOUNTER — Other Ambulatory Visit: Payer: Self-pay

## 2020-07-12 ENCOUNTER — Ambulatory Visit: Payer: Medicare Other

## 2020-07-12 DIAGNOSIS — R2689 Other abnormalities of gait and mobility: Secondary | ICD-10-CM

## 2020-07-12 DIAGNOSIS — M6281 Muscle weakness (generalized): Secondary | ICD-10-CM

## 2020-07-12 DIAGNOSIS — R2681 Unsteadiness on feet: Secondary | ICD-10-CM | POA: Diagnosis not present

## 2020-07-12 DIAGNOSIS — M545 Low back pain, unspecified: Secondary | ICD-10-CM

## 2020-07-12 DIAGNOSIS — M25551 Pain in right hip: Secondary | ICD-10-CM

## 2020-07-12 DIAGNOSIS — R269 Unspecified abnormalities of gait and mobility: Secondary | ICD-10-CM | POA: Diagnosis not present

## 2020-07-12 DIAGNOSIS — G8929 Other chronic pain: Secondary | ICD-10-CM | POA: Diagnosis not present
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
## 2020-07-15 ENCOUNTER — Encounter: Payer: Self-pay | Admitting: Physical Therapy

## 2020-07-15 ENCOUNTER — Other Ambulatory Visit: Payer: Self-pay

## 2020-07-15 ENCOUNTER — Ambulatory Visit: Payer: Medicare Other | Admitting: Physical Therapy

## 2020-07-15 DIAGNOSIS — R2681 Unsteadiness on feet: Secondary | ICD-10-CM | POA: Diagnosis not present

## 2020-07-15 DIAGNOSIS — R269 Unspecified abnormalities of gait and mobility: Secondary | ICD-10-CM | POA: Diagnosis not present

## 2020-07-15 DIAGNOSIS — R2689 Other abnormalities of gait and mobility: Secondary | ICD-10-CM | POA: Diagnosis not present

## 2020-07-15 DIAGNOSIS — G8929 Other chronic pain: Secondary | ICD-10-CM | POA: Diagnosis not present

## 2020-07-15 DIAGNOSIS — M6281 Muscle weakness (generalized): Secondary | ICD-10-CM | POA: Diagnosis not present

## 2020-07-15 DIAGNOSIS — M545 Low back pain, unspecified: Secondary | ICD-10-CM | POA: Diagnosis not present

## 2020-07-15 NOTE — Therapy (Signed)
[place]. [place], [name], [address] Phone: [phone]   Fax:  [phone]  Physical Therapy Treatment  Patient Details  Name: Julie [name] MRN: [id number] Date of Birth: 1932/06/10 Referring Provider (PT): Dr. Marcial Pacas, MD   Encounter Date: 07/15/2020   PT End of Session - 07/15/20 1226    Visit Number 4    Number of Visits 17    Date for PT Re-Evaluation 08/26/20    Authorization Type Medicare    PT Start Time 8341    PT Stop Time 1231    PT Time Calculation (min) 47 min    Activity Tolerance Patient tolerated treatment well    Behavior During Therapy [facility] for tasks assessed/performed           Past Medical History:  Diagnosis Date  . Anemia   . Anxiety    takes Klonopin at bedtime  . Arthritis    takes Methotrexate weekly and Prednisone  . Blood transfusion    no abnormal reaction noted  . Cancer (HCC)    breast   . Cataracts, bilateral    immature  . Complication of anesthesia    wakes up during surgery  . Depression   . Diabetes mellitus without complication ([place])   . Dyspnea    with exertion since chemo   . Fibromyalgia    takes Lyrica daily  . GERD (gastroesophageal reflux disease)    takes Nexium daily  . Glaucoma   . Hard of hearing   . Heart murmur   . Histoplasmosis   . History of colon polyps   . History of gout   . History of hiatal hernia   . History of shingles   . Hyperlipidemia   . Hypertension    takes Amlodipine,Metoprolol,and Losartan daily  . Incontinence of bowel   . Interstitial cystitis   . Joint pain   . Low back pain   . Nocturia   . Peripheral neuropathy   . Peripheral neuropathy   . Personal history of chemotherapy   . Personal history of radiation therapy   . PONV (postoperative nausea and vomiting)   . Ulcer 1970   duodenal  . Urinary frequency   . Urinary urgency   . Weakness    numbness in both hands and feet    Past Surgical History:   Procedure Laterality Date  . ABDOMINAL HYSTERECTOMY    . accupuncture  3 yrs ago  . ANKLE FUSION Left 05/10/2014   Procedure: LEFT ANKLE ARTHRODESIS ;  Surgeon: Wylene Simmer, MD;  Location: [facility] [place];  Service: Orthopedics;  Laterality: Left;  . ANKLE SURGERY Left   . arm surgery Left    radius  . bladder tacked     . BREAST LUMPECTOMY Left 10/2015  . CERVICAL FUSION    . CHOLECYSTECTOMY    . COLONOSCOPY    . CYSTOCELE REPAIR    . DILATION AND CURETTAGE OF UTERUS    . ESOPHAGOGASTRODUODENOSCOPY    . PORT A CATH REVISION N/A 12/27/2015   Procedure: PORT A CATH REVISION;  Surgeon: Rolm Bookbinder, MD;  Location: [place];  Service: General;  Laterality: N/A;  . PORT-A-CATH REMOVAL N/A 03/25/2016   Procedure: REMOVAL PORT-A-CATH;  Surgeon: Rolm Bookbinder, MD;  [facility];  Service: General;  Laterality: N/A;  . PORTACATH PLACEMENT Right 12/27/2015   Procedure: INSERTION PORT-A-CATH WITH ULTRASOUND ;  Surgeon: Rolm Bookbinder, MD;  Location: [facility];  Service: General;  Laterality: Right;  . RADIOACTIVE SEED GUIDED PARTIAL MASTECTOMY WITH AXILLARY SENTINEL LYMPH NODE BIOPSY Left 11/14/2015   Procedure: RADIOACTIVE SEED GUIDED LEFT BREAST LUMPECTOMY WITH AXILLARY SENTINEL LYMPH NODE BIOPSY;  Surgeon: Rolm Bookbinder, MD;  Location: [place];  Service: General;  Laterality: Left;  RADIOACTIVE SEED GUIDED LEFT BREAST LUMPECTOMY WITH AXILLARY SENTINEL LYMPH NODE BIOPSY  . SALPINGOOPHORECTOMY Bilateral   . TOTAL KNEE ARTHROPLASTY Right 02/28/2015   Procedure: RIGHT TOTAL KNEE ARTHROPLASTY;  Surgeon: Rod Can, MD;  [facility];  Service: Orthopedics;  Laterality: Right;  . UPPER GASTROINTESTINAL ENDOSCOPY    . VAGINAL HYSTERECTOMY      There were no vitals filed for this visit.   Subjective Assessment - 07/15/20 1145    Subjective " I am ok"    Currently in Pain? No/denies                              [facility] Adult PT Treatment/Exercise - 07/15/20 0001      Lumbar Exercises: Aerobic   UBE (Upper Arm Bike) L1 x2 min      Lumbar Exercises: Standing   Shoulder Extension Theraband;20 reps;Strengthening;Both    Theraband Level (Shoulder Extension) Level 2 (Red)      Lumbar Exercises: Seated   Sit to Stand 5 reps   x2   Other Seated Lumbar Exercises rows green 2x10, ab sets 2x10    Other Seated Lumbar Exercises Lumbar flexion/R/L ball rollouts x10      Lumbar Exercises: Supine   Other Supine Lumbar Exercises DKTC with green pBALL 10X2, LTR on green pball x10 B. Supine marches 10 x 2    Other Supine Lumbar Exercises Green ball squeezes 10 x 2. Clamshells with red TB 15 x 2      Knee/Hip Exercises: Seated   Long Arc Quad Both;2 sets;10 reps    Long Arc Quad Weight 2 lbs.    Marching Strengthening;Both;2 sets;10 reps    Marching Weights 2 lbs.      Modalities   Modalities Moist Heat      Moist Heat Therapy   Number Minutes Moist Heat 10 Minutes    Moist Heat Location Lumbar Spine;Hip                    PT Short Term Goals - 07/08/20 1142      PT SHORT TERM GOAL #1   Title Pt and caregiver  will be independent with inital HEP    Status Partially Met             PT Long Term Goals - 07/01/20 1226      PT LONG TERM GOAL #1   Title Pt/caregiver will be independent with advance HEP.    Time 8    Period Weeks    Status New    Target Date 08/26/20      PT LONG TERM GOAL #2   Title Pt will score at least 50/56 to demonstrate decreased falls risk.    Time 8    Period Weeks    Status New    Target Date 08/26/20      PT LONG TERM GOAL #3   Title Pt will report decreased radiating LBP to </= 2/10    Time 8    Period Weeks    Status New    Target Date 08/26/20      PT LONG  TERM GOAL #4   Title Pt will amb.300' with RW (or rollator) with supervision and no LOB for incr. community accessibility.    Time 8    Period Weeks    Status New    Target Date 08/26/20       PT LONG TERM GOAL #5   Title Pt will demo improved SLS to at least 15" each leg to facilitate stability during gait and stair negotiaiton.    Time 8    Period Weeks    Status New    Target Date 08/26/20                 Plan - 07/15/20 1227    Clinical Impression Statement Continues with a focus on core strength in supine and seated position. Avoided recumbent bike and NuStep warm up and elected UBE for two minutes, due to pt complaints of bike causing pain. Cues needed not to allow LE to push up against table with sit to stands. No issues reported with standing shoulder extensions. Attempted some bridges with little hip elevation but reports some pain in the buttocks, interventions discontinued.    Personal Factors and Comorbidities Comorbidity 2;Fitness;Past/Current Experience;Time since onset of injury/illness/exacerbation;Transportation    Comorbidities fibromyalgia, peripheral neuropathy, h/o Lt breast cancer, OA knees, lumbar stenosis, cervical fusion    Examination-Activity Limitations Locomotion Level;Squat;Stairs;Bend;Stand    Examination-Participation Restrictions Meal Prep;Cleaning;Shop;Community Activity;Laundry    PT Treatment/Interventions ADLs/Self Care Home Management;Electrical Stimulation;Cryotherapy;Moist Heat;DME Instruction;Neuromuscular re-education;Balance training;Therapeutic activities;Therapeutic exercise;Functional mobility training;Stair training;Gait training;Patient/family education;Manual techniques;Energy conservation;Passive range of motion    PT Next Visit Plan Progressive strength and balance as tolerated. Focus on seated and supine strengthening, short repetition standing exercises for balance based on tolerance with frequent rest breaks. D/C recumbent bike in future sessions as it has historically increased bakc pain per pt and pt daughter report. Monitor VS as needed. Short, clear instructions.           Patient will benefit from skilled  therapeutic intervention in order to improve the following deficits and impairments:  Difficulty walking,Decreased balance,Decreased strength,Pain,Impaired sensation,Abnormal gait,Increased muscle spasms,Decreased endurance,Improper body mechanics,Postural dysfunction,Decreased mobility,Decreased activity tolerance  Visit Diagnosis: Other abnormalities of gait and mobility  Muscle weakness (generalized)  Unsteadiness on feet     Problem List Patient Active Problem List   Diagnosis Date Noted  . Cerebellar ataxia in diseases classified elsewhere ([place]) 06/17/2020  . Mild cognitive impairment 12/14/2019  . Cerebral vascular disease 12/14/2019  . Gait abnormality 01/09/2019  . Paresthesia 01/09/2019  . Near syncope   . Weakness   . Essential hypertension   . Generalized weakness 12/13/2018  . Syncope 12/13/2018  . Peripheral neuropathy 06/08/2016  . Low back pain without sciatica 06/08/2016  . Abnormality of gait 06/08/2016  . Anemia due to antineoplastic chemotherapy 03/06/2016  . Hypersensitivity reaction 01/07/2016  . Port catheter in place 01/03/2016  . Malignant neoplasm of upper outer quadrant of female breast ([place]) 10/30/2015  . Primary osteoarthritis of right knee 02/28/2015  . Post-traumatic arthritis of ankle 05/10/2014  . MGUS (monoclonal gammopathy of unknown significance) 02/17/2012  . Diverticulosis of colon (without mention of hemorrhage) 01/21/2011  . Family history of malignant neoplasm of gastrointestinal tract 01/21/2011  . Special screening for malignant neoplasms, colon 01/21/2011  . Other symptoms involving digestive system(787.99) 01/21/2011    Scot Jun, PTA 07/15/2020, 12:31 PM  [place]. [place], [name], [address] Phone: [phone]   Fax:  [phone]  Name: AIRYANNA DIPALMA MRN: [id number] Date of Birth: Mar 28, 1933

## 2020-07-16 DIAGNOSIS — L821 Other seborrheic keratosis: Secondary | ICD-10-CM | POA: Diagnosis not present

## 2020-07-16 DIAGNOSIS — L910 Hypertrophic scar: Secondary | ICD-10-CM | POA: Diagnosis not present

## 2020-07-17 ENCOUNTER — Ambulatory Visit: Payer: Medicare Other | Admitting: Physical Therapy

## 2020-07-17 ENCOUNTER — Encounter: Payer: Self-pay | Admitting: Physical Therapy

## 2020-07-17 ENCOUNTER — Other Ambulatory Visit: Payer: Self-pay

## 2020-07-17 DIAGNOSIS — R2681 Unsteadiness on feet: Secondary | ICD-10-CM

## 2020-07-17 DIAGNOSIS — R2689 Other abnormalities of gait and mobility: Secondary | ICD-10-CM | POA: Diagnosis not present

## 2020-07-17 DIAGNOSIS — M6281 Muscle weakness (generalized): Secondary | ICD-10-CM | POA: Diagnosis not present

## 2020-07-17 DIAGNOSIS — R269 Unspecified abnormalities of gait and mobility: Secondary | ICD-10-CM | POA: Diagnosis not present

## 2020-07-17 DIAGNOSIS — G8929 Other chronic pain: Secondary | ICD-10-CM | POA: Diagnosis not present

## 2020-07-17 DIAGNOSIS — M545 Low back pain, unspecified: Secondary | ICD-10-CM | POA: Diagnosis not present

## 2020-07-17 NOTE — Therapy (Signed)
[facility]. [place], [name], [address] Phone: [phone]   Fax:  [phone]  Physical Therapy Treatment  Patient Details  Name: Julie [name] MRN: [id number] Date of Birth: Mar 30, 1933 Referring Provider (PT): Dr. Marcial Pacas, MD   Encounter Date: 07/17/2020   PT End of Session - 07/17/20 1055    Visit Number 5    Number of Visits 17    Date for PT Re-Evaluation 08/26/20    Authorization Type Medicare    PT Start Time 7628    PT Stop Time 1055    PT Time Calculation (min) 40 min    Activity Tolerance Patient tolerated treatment well    Behavior During Therapy [facility] for tasks assessed/performed           Past Medical History:  Diagnosis Date  . Anemia   . Anxiety    takes Klonopin at bedtime  . Arthritis    takes Methotrexate weekly and Prednisone  . Blood transfusion    no abnormal reaction noted  . Cancer (HCC)    breast   . Cataracts, bilateral    immature  . Complication of anesthesia    wakes up during surgery  . Depression   . Diabetes mellitus without complication ([place])   . Dyspnea    with exertion since chemo   . Fibromyalgia    takes Lyrica daily  . GERD (gastroesophageal reflux disease)    takes Nexium daily  . Glaucoma   . Hard of hearing   . Heart murmur   . Histoplasmosis   . History of colon polyps   . History of gout   . History of hiatal hernia   . History of shingles   . Hyperlipidemia   . Hypertension    takes Amlodipine,Metoprolol,and Losartan daily  . Incontinence of bowel   . Interstitial cystitis   . Joint pain   . Low back pain   . Nocturia   . Peripheral neuropathy   . Peripheral neuropathy   . Personal history of chemotherapy   . Personal history of radiation therapy   . PONV (postoperative nausea and vomiting)   . Ulcer 1970   duodenal  . Urinary frequency   . Urinary urgency   . Weakness    numbness in both hands and feet    Past Surgical History:   Procedure Laterality Date  . ABDOMINAL HYSTERECTOMY    . accupuncture  3 yrs ago  . ANKLE FUSION Left 05/10/2014   Procedure: LEFT ANKLE ARTHRODESIS ;  Surgeon: Wylene Simmer, MD;  Location: [place];  Service: Orthopedics;  Laterality: Left;  . ANKLE SURGERY Left   . arm surgery Left    radius  . bladder tacked     . BREAST LUMPECTOMY Left 10/2015  . CERVICAL FUSION    . CHOLECYSTECTOMY    . COLONOSCOPY    . CYSTOCELE REPAIR    . DILATION AND CURETTAGE OF UTERUS    . ESOPHAGOGASTRODUODENOSCOPY    . PORT A CATH REVISION N/A 12/27/2015   Procedure: PORT A CATH REVISION;  Surgeon: Rolm Bookbinder, MD;  Location: [place];  Service: General;  Laterality: N/A;  . PORT-A-CATH REMOVAL N/A 03/25/2016   Procedure: REMOVAL PORT-A-CATH;  Surgeon: Rolm Bookbinder, MD;  Location: [facility];  Service: General;  Laterality: N/A;  . PORTACATH PLACEMENT Right 12/27/2015   Procedure: INSERTION PORT-A-CATH WITH ULTRASOUND ;  Surgeon: Rolm Bookbinder, MD;  Location: [facility];  Service: General;  Laterality: Right;  . RADIOACTIVE SEED GUIDED PARTIAL MASTECTOMY WITH AXILLARY SENTINEL LYMPH NODE BIOPSY Left 11/14/2015   Procedure: RADIOACTIVE SEED GUIDED LEFT BREAST LUMPECTOMY WITH AXILLARY SENTINEL LYMPH NODE BIOPSY;  Surgeon: Rolm Bookbinder, MD;  Location: [facility];  Service: General;  Laterality: Left;  RADIOACTIVE SEED GUIDED LEFT BREAST LUMPECTOMY WITH AXILLARY SENTINEL LYMPH NODE BIOPSY  . SALPINGOOPHORECTOMY Bilateral   . TOTAL KNEE ARTHROPLASTY Right 02/28/2015   Procedure: RIGHT TOTAL KNEE ARTHROPLASTY;  Surgeon: Rod Can, MD;  Location: [facility];  Service: Orthopedics;  Laterality: Right;  . UPPER GASTROINTESTINAL ENDOSCOPY    . VAGINAL HYSTERECTOMY      There were no vitals filed for this visit.   Subjective Assessment - 07/17/20 1018    Subjective "I am ok, not enough pain to complain about"    Currently in Pain? No/denies                              [facility] Adult PT Treatment/Exercise - 07/17/20 0001      Ambulation/Gait   Ambulation/Gait Yes    Ambulation/Gait Assistance 5: Supervision    Ambulation Distance (Feet) 300 Feet    Assistive device Straight cane    Gait Pattern Step-through pattern    Ambulation Surface Level;Indoor      Lumbar Exercises: Aerobic   UBE (Upper Arm Bike) L1 x4 min      Lumbar Exercises: Seated   Sit to Stand 5 reps   x2   Other Seated Lumbar Exercises rows blue 2x10, ab sets 2x10; OHP 3lb 2x10      Knee/Hip Exercises: Seated   Long Arc Quad Both;2 sets;15 reps    Long Arc Quad Weight 3 lbs.    Ball Squeeze 2x15    Hamstring Curl Strengthening;Both;2 sets;10 reps    Hamstring Limitations Green Tband                    PT Short Term Goals - 07/08/20 1142      PT SHORT TERM GOAL #1   Title Pt and caregiver  will be independent with inital HEP    Status Partially Met             PT Long Term Goals - 07/17/20 1057      PT LONG TERM GOAL #1   Title Pt/caregiver will be independent with advance HEP.    Status On-going                 Plan - 07/17/20 1057    Clinical Impression Statement Pt did well progressing and meeting gait goal. Pt was easily distracted today with busy environment so constant cues to be redirected. Postural cues needed with seated rows. Cues for core engagement with seated OHP. No reports of pain throughout session.    Personal Factors and Comorbidities Comorbidity 2;Fitness;Past/Current Experience;Time since onset of injury/illness/exacerbation;Transportation    Comorbidities fibromyalgia, peripheral neuropathy, h/o Lt breast cancer, OA knees, lumbar stenosis, cervical fusion    Examination-Activity Limitations Locomotion Level;Squat;Stairs;Bend;Stand    Examination-Participation Restrictions Meal Prep;Cleaning;Shop;Community Activity;Laundry    Stability/Clinical Decision Making Evolving/Moderate complexity     Rehab Potential Good    PT Frequency 2x / week    PT Duration 8 weeks    PT Treatment/Interventions ADLs/Self Care Home Management;Electrical Stimulation;Cryotherapy;Moist Heat;DME Instruction;Neuromuscular re-education;Balance training;Therapeutic activities;Therapeutic exercise;Functional mobility training;Stair training;Gait training;Patient/family education;Manual techniques;Energy conservation;Passive range of motion  PT Next Visit Plan Progressive strength and balance as tolerated. Focus on seated and supine strengthening, short repetition standing exercises for balance based on tolerance with frequent rest breaks. D/C recumbent bike in future sessions as it has historically increased bakc pain per pt and pt daughter report. Monitor VS as needed. Short, clear instructions.           Patient will benefit from skilled therapeutic intervention in order to improve the following deficits and impairments:  Difficulty walking,Decreased balance,Decreased strength,Pain,Impaired sensation,Abnormal gait,Increased muscle spasms,Decreased endurance,Improper body mechanics,Postural dysfunction,Decreased mobility,Decreased activity tolerance  Visit Diagnosis: Muscle weakness (generalized)  Other abnormalities of gait and mobility  Unsteadiness on feet     Problem List Patient Active Problem List   Diagnosis Date Noted  . Cerebellar ataxia in diseases classified elsewhere ([place]) 06/17/2020  . Mild cognitive impairment 12/14/2019  . Cerebral vascular disease 12/14/2019  . Gait abnormality 01/09/2019  . Paresthesia 01/09/2019  . Near syncope   . Weakness   . Essential hypertension   . Generalized weakness 12/13/2018  . Syncope 12/13/2018  . Peripheral neuropathy 06/08/2016  . Low back pain without sciatica 06/08/2016  . Abnormality of gait 06/08/2016  . Anemia due to antineoplastic chemotherapy 03/06/2016  . Hypersensitivity reaction 01/07/2016  . Port catheter in place 01/03/2016  .  Malignant neoplasm of upper outer quadrant of female breast ([place]) 10/30/2015  . Primary osteoarthritis of right knee 02/28/2015  . Post-traumatic arthritis of ankle 05/10/2014  . MGUS (monoclonal gammopathy of unknown significance) 02/17/2012  . Diverticulosis of colon (without mention of hemorrhage) 01/21/2011  . Family history of malignant neoplasm of gastrointestinal tract 01/21/2011  . Special screening for malignant neoplasms, colon 01/21/2011  . Other symptoms involving digestive system(787.99) 01/21/2011    Scot Jun, PTA 07/17/2020, 11:00 AM  [facility]. [place], [name], [address] Phone: [phone]   Fax:  [phone]  Name: MALESSA ZARTMAN MRN: [id number] Date of Birth: 1932-06-13

## 2020-07-22 ENCOUNTER — Other Ambulatory Visit: Payer: Self-pay

## 2020-07-22 ENCOUNTER — Ambulatory Visit: Payer: Medicare Other

## 2020-07-22 DIAGNOSIS — M6281 Muscle weakness (generalized): Secondary | ICD-10-CM | POA: Diagnosis not present

## 2020-07-22 DIAGNOSIS — R2689 Other abnormalities of gait and mobility: Secondary | ICD-10-CM | POA: Diagnosis not present

## 2020-07-22 DIAGNOSIS — G8929 Other chronic pain: Secondary | ICD-10-CM | POA: Diagnosis not present

## 2020-07-22 DIAGNOSIS — R269 Unspecified abnormalities of gait and mobility: Secondary | ICD-10-CM | POA: Diagnosis not present

## 2020-07-22 DIAGNOSIS — M25551 Pain in right hip: Secondary | ICD-10-CM

## 2020-07-22 DIAGNOSIS — M545 Low back pain, unspecified: Secondary | ICD-10-CM | POA: Diagnosis not present

## 2020-07-22 DIAGNOSIS — R2681 Unsteadiness on feet: Secondary | ICD-10-CM | POA: Diagnosis not present
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
## 2020-07-22 NOTE — Progress Notes (Signed)
Julie [name]   [phone] [phone]   Clinic Follow up Note   Patient Care Team: [name], DO as PCP - General (Family Medicine) Valinda Party, MD as Referring Physician (Rheumatology) Rolm Bookbinder, MD as Consulting Physician (General Surgery) Truitt Merle, MD as Consulting Physician (Hematology) Delice Bison Charlestine Massed, NP as Nurse Practitioner (Hematology and Oncology)  Date of Service:  07/25/2020  CHIEF COMPLAINT: F/u of left breast cancer  SUMMARY OF ONCOLOGIC HISTORY: Oncology History Overview Note  Malignant neoplasm of upper outer quadrant of female breast [facility])   Staging form: Breast, AJCC 7th Edition     Pathologic stage from 11/14/2015: Stage IA (T1c, N0, cM0) - Signed by Truitt Merle, MD on 11/25/2015     Clinical stage from 11/20/2015: Stage IA (T1c, N0, M0) - Signed by Curt Bears, MD on 11/20/2015     Malignant neoplasm of upper outer quadrant of female breast ([place])  10/09/2015 Mammogram   Screening bilateral mammograms showed a possible left breast mass. Diagnostic mammogram and ultrasound showed a 7 x 7 x 7 mm mass in the left breast at 11:00 position.   10/22/2015 Receptors her2   ER negative, PR negative, HER-2 not amplified   10/22/2015 Initial Biopsy   Left breast needle core biopsy at the 1:00 position showed invasive ductal carcinoma.   10/22/2015 Initial Diagnosis   Malignant neoplasm of upper outer quadrant of female breast ([place])   11/14/2015 Surgery   Left breast lumpectomy and sentinel lymph node biopsy [name])   11/14/2015 Pathology Results   Left breast lumpectomy showed invasive ductal carcinoma, grade 3, 1.1 cm, resection margins were negative, sentinel lymph node biopsy showed benign breast parenchyma, lymph node tissue not identified. Repeated ER PR and HER-2 were all negative.   12/20/2015 - 03/13/2016 Chemotherapy   Weekly Taxol 80 mg/m x 2 cycles, then changed to Abraxane 100 mg/m for cycles 3-12  due to infusion reaction. Abraxane dose-reduced cycle #9, then again for cycles #11 & #12 due to neuropathy. [total 12 cycles chemo]    04/01/2016 - 05/11/2016 Radiation Therapy   Adjuvant breast radiation ([place]). Left breast: 50.4 Gy in 28 fractions   06/19/2016 Imaging   MR Lumbar Spine without contrast  IMPRESSION: 1. Diffuse lumbar spondylosis with discogenic and facet degenerative changes progressed from 2010 lumbar MRI. 2. Multilevel canal stenosis is mild-to-moderate at L2-3, moderate L3-4, and mild at L4-5. 3. Multiple levels of mild and moderate neural foraminal narrowing with severe foraminal narrowing at the right L3-4 and L4-5 levels.   11/12/2017 Imaging   11/12/2017 DEXA ASSESSMENT: The BMD measured at Forearm Radius 33% is 0.648 g/cm2 with a T-score of -2.7. This patient is considered osteoporotic according to [facility]) criteria.   11/12/2017 Mammogram   11/12/2017 Diagnostic Mammogram IMPRESSION: No mammographic evidence of malignancy in either breast, status post left lumpectomy.      CURRENT THERAPY:  Surveillance  INTERVAL HISTORY:  Julie [name] is here for a follow up of left breast cancer. She presents with her daughter today.  She is doing well overall, she is on PT now for balance and back pain.  She has intermittent back and hip pain which is worse in morning, overall tolerable  She is on omeprizole for GERD and recently increased dose, saw Dr. Benson Norway and had EGD recently  Saw Dr. Krista Blue for memory loss and confusion (resolved), had brain MRI  She had jaw aches for 2 weeks after the prolia injection 6  months ago   All other systems were reviewed with the patient and are negative.  MEDICAL HISTORY:  Past Medical History:  Diagnosis Date   Anemia    Anxiety    takes Klonopin at bedtime   Arthritis    takes Methotrexate weekly and Prednisone   Blood transfusion    no abnormal reaction noted   Cancer (HCC)    breast     Cataracts, bilateral    immature   Complication of anesthesia    wakes up during surgery   Depression    Diabetes mellitus without complication (HCC)    Dyspnea    with exertion since chemo    Fibromyalgia    takes Lyrica daily   GERD (gastroesophageal reflux disease)    takes Nexium daily   Glaucoma    Hard of hearing    Heart murmur    Histoplasmosis    History of colon polyps    History of gout    History of hiatal hernia    History of shingles    Hyperlipidemia    Hypertension    takes Amlodipine,Metoprolol,and Losartan daily   Incontinence of bowel    Interstitial cystitis    Joint pain    Low back pain    Nocturia    Peripheral neuropathy    Peripheral neuropathy    Personal history of chemotherapy    Personal history of radiation therapy    PONV (postoperative nausea and vomiting)    Ulcer 1970   duodenal   Urinary frequency    Urinary urgency    Weakness    numbness in both hands and feet    SURGICAL HISTORY: Past Surgical History:  Procedure Laterality Date   ABDOMINAL HYSTERECTOMY     accupuncture  3 yrs ago   ANKLE FUSION Left 05/10/2014   Procedure: LEFT ANKLE ARTHRODESIS ;  Surgeon: Wylene Simmer, MD;  Location: [place];  Service: Orthopedics;  Laterality: Left;   ANKLE SURGERY Left    arm surgery Left    radius   bladder tacked      BREAST LUMPECTOMY Left 10/2015   CERVICAL FUSION     CHOLECYSTECTOMY     COLONOSCOPY     CYSTOCELE REPAIR     DILATION AND CURETTAGE OF UTERUS     ESOPHAGOGASTRODUODENOSCOPY     PORT A CATH REVISION N/A 12/27/2015   Procedure: PORT A CATH REVISION;  Surgeon: Rolm Bookbinder, MD;  Location: [place];  Service: General;  Laterality: N/A;   PORT-A-CATH REMOVAL N/A 03/25/2016   Procedure: REMOVAL PORT-A-CATH;  Surgeon: Rolm Bookbinder, MD;  [facility];  Service: General;  Laterality: N/A;   PORTACATH PLACEMENT Right 12/27/2015   Procedure:  INSERTION PORT-A-CATH WITH ULTRASOUND ;  Surgeon: Rolm Bookbinder, MD;  Location: [place];  Service: General;  Laterality: Right;   RADIOACTIVE SEED GUIDED PARTIAL MASTECTOMY WITH AXILLARY SENTINEL LYMPH NODE BIOPSY Left 11/14/2015   Procedure: RADIOACTIVE SEED GUIDED LEFT BREAST LUMPECTOMY WITH AXILLARY SENTINEL LYMPH NODE BIOPSY;  Surgeon: Rolm Bookbinder, MD;  Location: [place];  Service: General;  Laterality: Left;  RADIOACTIVE SEED GUIDED LEFT BREAST LUMPECTOMY WITH AXILLARY SENTINEL LYMPH NODE BIOPSY   SALPINGOOPHORECTOMY Bilateral    TOTAL KNEE ARTHROPLASTY Right 02/28/2015   Procedure: RIGHT TOTAL KNEE ARTHROPLASTY;  Surgeon: Rod Can, MD;  [facility];  Service: Orthopedics;  Laterality: Right;   UPPER GASTROINTESTINAL ENDOSCOPY     VAGINAL HYSTERECTOMY      I  have reviewed the social history and family history with the patient and they are unchanged from previous note.  ALLERGIES:  is allergic to codeine, other, and aspirin.  MEDICATIONS:  Current Outpatient Medications  Medication Sig Dispense Refill   amitriptyline (ELAVIL) 50 MG tablet Take 50 mg by mouth at bedtime.     amLODipine (NORVASC) 10 MG tablet Take 10 mg by mouth daily.     cholecalciferol (VITAMIN D3) 25 MCG (1000 UNIT) tablet Take 1,000 Units by mouth daily.     diclofenac Sodium (VOLTAREN) 1 % GEL SMARTSIG:3 Inch(es) Topical 3 Times Daily PRN     folic acid (FOLVITE) 1 MG tablet Take 2 mg by mouth every morning.     hydroxychloroquine (PLAQUENIL) 200 MG tablet Take 200 mg by mouth daily.     LIDOCAINE PAIN RELIEF 4 % PTCH Apply 1 patch topically 2 (two) times daily.     lovastatin (MEVACOR) 40 MG tablet 1 (ONE) TABLET TAKE IN THE EVENING AFTER DINNER (Patient taking differently: Take 40 mg by mouth every evening.) 90 tablet 1   metoprolol succinate (TOPROL-XL) 50 MG 24 hr tablet Take 50 mg by mouth every evening.   2   omeprazole (PRILOSEC) 40 MG  capsule Take 40 mg by mouth daily.     oxyCODONE (OXY IR/ROXICODONE) 5 MG immediate release tablet Take 5 mg by mouth 3 (three) times daily as needed.     polyethylene glycol (MIRALAX / GLYCOLAX) 17 g packet Take 17 g by mouth daily.     predniSONE (DELTASONE) 5 MG tablet Take 7.5 mg by mouth daily.     pregabalin (LYRICA) 75 MG capsule Take 75-150 mg by mouth See admin instructions. 75 Mg in the Am and 150 MG at bedtime     valsartan-hydrochlorothiazide (DIOVAN-HCT) 320-12.5 MG tablet Take 0.5 tablets by mouth daily.     No current facility-administered medications for this visit.    PHYSICAL EXAMINATION: ECOG PERFORMANCE STATUS: 1 - Symptomatic but completely ambulatory  Vitals:   07/25/20 0802  BP: (!) 151/69  Pulse: 60  Resp: 16  Temp: (!) 97.3 F (36.3 C)  SpO2: 100%   Filed Weights   07/25/20 0802  Weight: 214 lb 3.2 oz (97.2 kg)    GENERAL:alert, no distress and comfortable SKIN: skin color, texture, turgor are normal, no rashes or significant lesions EYES: normal, Conjunctiva are pink and non-injected, sclera clear NECK: supple, thyroid normal size, non-tender, without nodularity LYMPH:  no palpable lymphadenopathy in the cervical, axillary  LUNGS: clear to auscultation and percussion with normal breathing effort HEART: regular rate & rhythm and no murmurs and no lower extremity edema ABDOMEN:abdomen soft, non-tender and normal bowel sounds Musculoskeletal:no cyanosis of digits and no clubbing  NEURO: alert & oriented x 3 with fluent speech, no focal motor/sensory deficits Breasts: Breast inspection showed them to be symmetrical with no nipple discharge. Palpation of the breasts and axilla revealed a 1cm nodule above the incision scar in left breast, no other obvious mass that I could appreciate.   LABORATORY DATA:  I have reviewed the data as listed CBC Latest Ref Rng & Units 07/25/2020 04/16/2020 02/13/2020  WBC 4.0 - 10.5 K/uL 4.1 4.3 4.1  Hemoglobin 12.0 -  15.0 g/dL 11.1(L) 11.8(L) 11.3(L)  Hematocrit 36.0 - 46.0 % 36.0 37.7 36.9  Platelets 150 - 400 K/uL 185 216 220     CMP Latest Ref Rng & Units 07/25/2020 04/16/2020 02/13/2020  Glucose 70 - 99 mg/dL 97 63(E) 68(H)  BUN  8 - 23 mg/dL 27(H) 31(H) 21  Creatinine 0.44 - 1.00 mg/dL 1.26(H) 1.32(H) 1.16(H)  Sodium 135 - 145 mmol/L 141 142 140  Potassium 3.5 - 5.1 mmol/L 3.9 4.2 5.2(H)  Chloride 98 - 111 mmol/L 107 109 107  CO2 22 - 32 mmol/L $RemoveB'26 27 27  'MRxBNpwP$ Calcium 8.9 - 10.3 mg/dL 8.6(L) 8.5(L) 9.1  Total Protein 6.5 - 8.1 g/dL 6.9 6.9 7.1  Total Bilirubin 0.3 - 1.2 mg/dL 0.6 0.7 0.5  Alkaline Phos 38 - 126 U/L 65 56 80  AST 15 - 41 U/L $Remo'24 25 25  'nRmRf$ ALT 0 - 44 U/L $Remo'14 16 16      'lIuHR$ RADIOGRAPHIC STUDIES: I have personally reviewed the radiological images as listed and agreed with the findings in the report. No results found.   ASSESSMENT & PLAN:  Julie [name] is a 87 y.o. female with   1. Malignant or new present of upper outer quadrant of left female breast, invasive ductal carcinoma, grade 3, pT1cN0M0, stage IA, ER-/PR-/HER2- -She was diagnosed in 09/2015. She is s/p left breast lumpectomy, adjuvant Taxol/Abraxane and adjuvant radiation.She is currently on Surveillance.  -She is clinically doing well. Lab reviewed, her CBC and CMP are within normal limits except mildly elevated Cr. Her physical exam and her 11/2019 mammogram were unremarkable. There is no clinical concern for recurrence. -She is almost 5 years out from her initial diagnosis, her risk of recurrence has significantly dropped -Continue annual mammogram, due in July 2022   2. MGUS  -Her M-protein has been around 0.8-0.9 since 2017  -No clinical concern for disease progression.Will continue follow up SPE/IFE, and light chain levels every 3-6 months. -Her 04/16/20 M protein remains stable at 0.8. Today's MM panel is still pending. Given her blood counts and kidney/liver functions are stable, will hold bone marrow biopsy for now.   -She is up to date on Flu shot and had COVID booster vaccine.   3.Joint and muscular pain, Rheumatoid arthritis,lumbar spondylosis -She will continue follow up with rheumatologist Dr. Dossie Der. -She has chronic low back pain from lumber spondylosis and sacrum. Her CT Lumbar spine from 09/07/19 showed increased spinal stenosis of her lumbar spine. -Pain is stable and controlled.   4. Anemia,intermittent  -Pt notes 2021 endoscopy by Dr Benson Norway. Last colonoscopy was in 9937.  -On Folic Acid. I also recommend she start prenatal vitamin.   5. HTN -Continuemedications and continueto Follow with Dr. Maudie Mercury on this.   6. Short Term Memory loss -f/u with Dr. Krista Blue   7. Osteoporosis -10/2017 DEXA shows osteoporosis with lowest T-score of -2.7 at Forearm radius. -She is athigh fracture risk due to her frequent falls.  -she started Prolia injections q27months on 02/01/20. She tolerated poorly with 2 weeks of jaw aches. Will stop  -She is currently on Vit D as well. Calcium I recommend she start Tums or calcium.  -I encouraged her to continue weightbearing exercise, she is doing physical therapy  8. Genetics -She has a history of breast cancer in her mother and maternal aunt -She is interested in genetic testing, I will refer her  Plan -Lab reviewed, she is clinically stable, continue observation -We will cancel Prolia injection due to poor tolerance -Lab and follow-up in 6 months   No problem-specific Assessment & Plan notes found for this encounter.   Orders Placed This Encounter  Procedures   Ambulatory referral to Genetics    Referral Priority:   Routine    Referral Type:   Consultation  Referral Reason:   Specialty Services Required    Number of Visits Requested:   1   All questions were answered. The patient knows to call the clinic with any problems, questions or concerns. No barriers to learning was detected. The total time spent in the appointment was 30 minutes.      Truitt Merle, MD 07/25/2020   I, Joslyn Devon, am acting as scribe for Truitt Merle, MD.   I have reviewed the above documentation for accuracy and completeness, and I agree with the above.

## 2020-07-23 DIAGNOSIS — M797 Fibromyalgia: Secondary | ICD-10-CM | POA: Diagnosis not present

## 2020-07-23 DIAGNOSIS — Z79899 Other long term (current) drug therapy: Secondary | ICD-10-CM | POA: Diagnosis not present

## 2020-07-23 DIAGNOSIS — E119 Type 2 diabetes mellitus without complications: Secondary | ICD-10-CM | POA: Diagnosis not present

## 2020-07-23 DIAGNOSIS — M545 Low back pain, unspecified: Secondary | ICD-10-CM | POA: Diagnosis not present

## 2020-07-23 DIAGNOSIS — Z6837 Body mass index (BMI) 37.0-37.9, adult: Secondary | ICD-10-CM | POA: Diagnosis not present

## 2020-07-23 DIAGNOSIS — Z9181 History of falling: Secondary | ICD-10-CM | POA: Diagnosis not present

## 2020-07-24 ENCOUNTER — Ambulatory Visit: Payer: Medicare Other | Attending: Neurology

## 2020-07-24 ENCOUNTER — Other Ambulatory Visit: Payer: Self-pay

## 2020-07-24 DIAGNOSIS — M6281 Muscle weakness (generalized): Secondary | ICD-10-CM | POA: Insufficient documentation

## 2020-07-24 DIAGNOSIS — M25551 Pain in right hip: Secondary | ICD-10-CM | POA: Insufficient documentation

## 2020-07-24 DIAGNOSIS — R2689 Other abnormalities of gait and mobility: Secondary | ICD-10-CM | POA: Diagnosis not present

## 2020-07-24 DIAGNOSIS — R269 Unspecified abnormalities of gait and mobility: Secondary | ICD-10-CM | POA: Insufficient documentation

## 2020-07-24 DIAGNOSIS — G8929 Other chronic pain: Secondary | ICD-10-CM | POA: Insufficient documentation

## 2020-07-24 DIAGNOSIS — R2681 Unsteadiness on feet: Secondary | ICD-10-CM | POA: Insufficient documentation

## 2020-07-24 DIAGNOSIS — M545 Low back pain, unspecified: Secondary | ICD-10-CM | POA: Diagnosis not present
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
## 2020-07-25 ENCOUNTER — Inpatient Hospital Stay (HOSPITAL_BASED_OUTPATIENT_CLINIC_OR_DEPARTMENT_OTHER): Payer: Medicare Other | Admitting: Hematology

## 2020-07-25 ENCOUNTER — Telehealth: Payer: Self-pay | Admitting: Hematology

## 2020-07-25 ENCOUNTER — Inpatient Hospital Stay: Payer: Medicare Other | Attending: Hematology

## 2020-07-25 ENCOUNTER — Inpatient Hospital Stay: Payer: Medicare Other

## 2020-07-25 ENCOUNTER — Other Ambulatory Visit: Payer: Self-pay

## 2020-07-25 ENCOUNTER — Encounter: Payer: Self-pay | Admitting: Hematology

## 2020-07-25 VITALS — BP 151/69 | HR 60 | Temp 97.3°F | Resp 16 | Ht 64.0 in | Wt 214.2 lb

## 2020-07-25 DIAGNOSIS — E785 Hyperlipidemia, unspecified: Secondary | ICD-10-CM | POA: Insufficient documentation

## 2020-07-25 DIAGNOSIS — C50412 Malignant neoplasm of upper-outer quadrant of left female breast: Secondary | ICD-10-CM

## 2020-07-25 DIAGNOSIS — C50411 Malignant neoplasm of upper-outer quadrant of right female breast: Secondary | ICD-10-CM

## 2020-07-25 DIAGNOSIS — D472 Monoclonal gammopathy: Secondary | ICD-10-CM

## 2020-07-25 DIAGNOSIS — Z7952 Long term (current) use of systemic steroids: Secondary | ICD-10-CM | POA: Diagnosis not present

## 2020-07-25 DIAGNOSIS — I10 Essential (primary) hypertension: Secondary | ICD-10-CM | POA: Insufficient documentation

## 2020-07-25 DIAGNOSIS — Z79899 Other long term (current) drug therapy: Secondary | ICD-10-CM | POA: Diagnosis not present

## 2020-07-25 DIAGNOSIS — Z171 Estrogen receptor negative status [ER-]: Secondary | ICD-10-CM

## 2020-07-25 DIAGNOSIS — D649 Anemia, unspecified: Secondary | ICD-10-CM | POA: Insufficient documentation

## 2020-07-25 LAB — CBC WITH DIFFERENTIAL/PLATELET
Abs Immature Granulocytes: 0.02 10*3/uL (ref 0.00–0.07)
Basophils Absolute: 0 10*3/uL (ref 0.0–0.1)
Basophils Relative: 1 %
Eosinophils Absolute: 0.2 10*3/uL (ref 0.0–0.5)
Eosinophils Relative: 4 %
HCT: 36 % (ref 36.0–46.0)
Hemoglobin: 11.1 g/dL — ABNORMAL LOW (ref 12.0–15.0)
Immature Granulocytes: 1 %
Lymphocytes Relative: 25 %
Lymphs Abs: 1 10*3/uL (ref 0.7–4.0)
MCH: 27.1 pg (ref 26.0–34.0)
MCHC: 30.8 g/dL (ref 30.0–36.0)
MCV: 87.8 fL (ref 80.0–100.0)
Monocytes Absolute: 0.4 10*3/uL (ref 0.1–1.0)
Monocytes Relative: 11 %
Neutro Abs: 2.4 10*3/uL (ref 1.7–7.7)
Neutrophils Relative %: 58 %
Platelets: 185 10*3/uL (ref 150–400)
RBC: 4.1 MIL/uL (ref 3.87–5.11)
RDW: 14.6 % (ref 11.5–15.5)
WBC: 4.1 10*3/uL (ref 4.0–10.5)
nRBC: 0 % (ref 0.0–0.2)

## 2020-07-25 LAB — COMPREHENSIVE METABOLIC PANEL
ALT: 14 U/L (ref 0–44)
AST: 24 U/L (ref 15–41)
Albumin: 3.5 g/dL (ref 3.5–5.0)
Alkaline Phosphatase: 65 U/L (ref 38–126)
Anion gap: 8 (ref 5–15)
BUN: 27 mg/dL — ABNORMAL HIGH (ref 8–23)
CO2: 26 mmol/L (ref 22–32)
Calcium: 8.6 mg/dL — ABNORMAL LOW (ref 8.9–10.3)
Chloride: 107 mmol/L (ref 98–111)
Creatinine, Ser: 1.26 mg/dL — ABNORMAL HIGH (ref 0.44–1.00)
GFR, Estimated: 42 mL/min — ABNORMAL LOW (ref >60)
Glucose, Bld: 97 mg/dL (ref 70–99)
Potassium: 3.9 mmol/L (ref 3.5–5.1)
Sodium: 141 mmol/L (ref 135–145)
Total Bilirubin: 0.6 mg/dL (ref 0.3–1.2)
Total Protein: 6.9 g/dL (ref 6.5–8.1)

## 2020-07-25 NOTE — Telephone Encounter (Signed)
Scheduled per los. Gave avs and calendar  

## 2020-07-26 LAB — KAPPA/LAMBDA LIGHT CHAINS
Kappa free light chain: 51.9 mg/L — ABNORMAL HIGH (ref 3.3–19.4)
Kappa, lambda light chain ratio: 3.9 — ABNORMAL HIGH (ref 0.26–1.65)
Lambda free light chains: 13.3 mg/L (ref 5.7–26.3)

## 2020-07-29 ENCOUNTER — Encounter: Payer: Self-pay | Admitting: Physical Therapy

## 2020-07-29 ENCOUNTER — Ambulatory Visit: Payer: Medicare Other | Admitting: Physical Therapy

## 2020-07-29 ENCOUNTER — Other Ambulatory Visit: Payer: Self-pay

## 2020-07-29 DIAGNOSIS — M6281 Muscle weakness (generalized): Secondary | ICD-10-CM

## 2020-07-29 DIAGNOSIS — R2689 Other abnormalities of gait and mobility: Secondary | ICD-10-CM | POA: Diagnosis not present

## 2020-07-29 DIAGNOSIS — M545 Low back pain, unspecified: Secondary | ICD-10-CM | POA: Diagnosis not present

## 2020-07-29 DIAGNOSIS — R2681 Unsteadiness on feet: Secondary | ICD-10-CM | POA: Diagnosis not present

## 2020-07-29 DIAGNOSIS — R269 Unspecified abnormalities of gait and mobility: Secondary | ICD-10-CM

## 2020-07-29 DIAGNOSIS — M25551 Pain in right hip: Secondary | ICD-10-CM | POA: Diagnosis not present
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
## 2020-07-30 LAB — MULTIPLE MYELOMA PANEL, SERUM
Albumin SerPl Elph-Mcnc: 3.5 g/dL (ref 2.9–4.4)
Albumin/Glob SerPl: 1.2 (ref 0.7–1.7)
Alpha 1: 0.2 g/dL (ref 0.0–0.4)
Alpha2 Glob SerPl Elph-Mcnc: 0.6 g/dL (ref 0.4–1.0)
B-Globulin SerPl Elph-Mcnc: 1.5 g/dL — ABNORMAL HIGH (ref 0.7–1.3)
Gamma Glob SerPl Elph-Mcnc: 0.6 g/dL (ref 0.4–1.8)
Globulin, Total: 3 g/dL (ref 2.2–3.9)
IgA: 812 mg/dL — ABNORMAL HIGH (ref 64–422)
IgG (Immunoglobin G), Serum: 765 mg/dL (ref 586–1602)
IgM (Immunoglobulin M), Srm: 53 mg/dL (ref 26–217)
M Protein SerPl Elph-Mcnc: 0.7 g/dL — ABNORMAL HIGH
Total Protein ELP: 6.5 g/dL (ref 6.0–8.5)

## 2020-07-31 ENCOUNTER — Encounter: Payer: Self-pay | Admitting: Physical Therapy

## 2020-07-31 ENCOUNTER — Telehealth: Payer: Self-pay

## 2020-07-31 ENCOUNTER — Ambulatory Visit: Payer: Medicare Other | Admitting: Physical Therapy

## 2020-07-31 ENCOUNTER — Other Ambulatory Visit: Payer: Self-pay

## 2020-07-31 DIAGNOSIS — R2681 Unsteadiness on feet: Secondary | ICD-10-CM

## 2020-07-31 DIAGNOSIS — R269 Unspecified abnormalities of gait and mobility: Secondary | ICD-10-CM | POA: Diagnosis not present

## 2020-07-31 DIAGNOSIS — M25551 Pain in right hip: Secondary | ICD-10-CM | POA: Diagnosis not present

## 2020-07-31 DIAGNOSIS — R2689 Other abnormalities of gait and mobility: Secondary | ICD-10-CM

## 2020-07-31 DIAGNOSIS — M545 Low back pain, unspecified: Secondary | ICD-10-CM | POA: Diagnosis not present

## 2020-07-31 DIAGNOSIS — M6281 Muscle weakness (generalized): Secondary | ICD-10-CM

## 2020-07-31 NOTE — Telephone Encounter (Signed)
Spoke with pt made aware of most recent labs no concerns or new orders call for any questions concerns or changes

## 2020-07-31 NOTE — Telephone Encounter (Signed)
-----   Message from Truitt Merle, MD sent at 07/30/2020  8:02 PM EST ----- Please let pt know her M-protein and light chain levels are stable, no concerns, thanks   Truitt Merle  07/30/2020
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
## 2020-08-05 ENCOUNTER — Encounter: Payer: Self-pay | Admitting: Physical Therapy

## 2020-08-05 ENCOUNTER — Other Ambulatory Visit: Payer: Self-pay

## 2020-08-05 ENCOUNTER — Ambulatory Visit: Payer: Medicare Other | Admitting: Physical Therapy

## 2020-08-05 DIAGNOSIS — M25551 Pain in right hip: Secondary | ICD-10-CM

## 2020-08-05 DIAGNOSIS — M545 Low back pain, unspecified: Secondary | ICD-10-CM

## 2020-08-05 DIAGNOSIS — M6281 Muscle weakness (generalized): Secondary | ICD-10-CM

## 2020-08-05 DIAGNOSIS — R2689 Other abnormalities of gait and mobility: Secondary | ICD-10-CM

## 2020-08-05 DIAGNOSIS — R2681 Unsteadiness on feet: Secondary | ICD-10-CM | POA: Diagnosis not present

## 2020-08-05 DIAGNOSIS — R269 Unspecified abnormalities of gait and mobility: Secondary | ICD-10-CM | POA: Diagnosis not present
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
## 2020-08-07 ENCOUNTER — Ambulatory Visit: Payer: Medicare Other

## 2020-08-07 ENCOUNTER — Other Ambulatory Visit: Payer: Self-pay

## 2020-08-07 DIAGNOSIS — R2681 Unsteadiness on feet: Secondary | ICD-10-CM | POA: Diagnosis not present

## 2020-08-07 DIAGNOSIS — M545 Low back pain, unspecified: Secondary | ICD-10-CM | POA: Diagnosis not present

## 2020-08-07 DIAGNOSIS — M25551 Pain in right hip: Secondary | ICD-10-CM | POA: Diagnosis not present

## 2020-08-07 DIAGNOSIS — M6281 Muscle weakness (generalized): Secondary | ICD-10-CM

## 2020-08-07 DIAGNOSIS — R269 Unspecified abnormalities of gait and mobility: Secondary | ICD-10-CM | POA: Diagnosis not present

## 2020-08-07 DIAGNOSIS — G8929 Other chronic pain: Secondary | ICD-10-CM

## 2020-08-07 DIAGNOSIS — R2689 Other abnormalities of gait and mobility: Secondary | ICD-10-CM | POA: Diagnosis not present
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
## 2020-08-12 ENCOUNTER — Inpatient Hospital Stay: Payer: Medicare Other | Admitting: Genetic Counselor

## 2020-08-26 ENCOUNTER — Ambulatory Visit: Payer: Medicare Other | Admitting: Physical Therapy

## 2020-08-27 DIAGNOSIS — Z79899 Other long term (current) drug therapy: Secondary | ICD-10-CM | POA: Diagnosis not present

## 2020-08-27 DIAGNOSIS — E669 Obesity, unspecified: Secondary | ICD-10-CM | POA: Diagnosis not present

## 2020-08-27 DIAGNOSIS — M797 Fibromyalgia: Secondary | ICD-10-CM | POA: Diagnosis not present

## 2020-08-27 DIAGNOSIS — Z9181 History of falling: Secondary | ICD-10-CM | POA: Diagnosis not present

## 2020-08-27 DIAGNOSIS — E119 Type 2 diabetes mellitus without complications: Secondary | ICD-10-CM | POA: Diagnosis not present

## 2020-08-27 DIAGNOSIS — I00 Rheumatic fever without heart involvement: Secondary | ICD-10-CM | POA: Diagnosis not present

## 2020-08-27 DIAGNOSIS — M545 Low back pain, unspecified: Secondary | ICD-10-CM | POA: Diagnosis not present

## 2020-08-27 DIAGNOSIS — M7061 Trochanteric bursitis, right hip: Secondary | ICD-10-CM | POA: Diagnosis not present

## 2020-08-28 ENCOUNTER — Ambulatory Visit: Payer: Medicare Other | Admitting: Physical Therapy

## 2020-08-29 ENCOUNTER — Inpatient Hospital Stay: Payer: Medicare Other

## 2020-08-29 ENCOUNTER — Other Ambulatory Visit: Payer: Self-pay

## 2020-08-29 ENCOUNTER — Other Ambulatory Visit: Payer: Self-pay | Admitting: Genetic Counselor

## 2020-08-29 ENCOUNTER — Inpatient Hospital Stay: Payer: Medicare Other | Attending: Genetic Counselor | Admitting: Genetic Counselor

## 2020-08-29 DIAGNOSIS — C50419 Malignant neoplasm of upper-outer quadrant of unspecified female breast: Secondary | ICD-10-CM | POA: Diagnosis not present

## 2020-08-29 DIAGNOSIS — Z800 Family history of malignant neoplasm of digestive organs: Secondary | ICD-10-CM | POA: Diagnosis not present

## 2020-08-29 DIAGNOSIS — Z803 Family history of malignant neoplasm of breast: Secondary | ICD-10-CM

## 2020-08-29 DIAGNOSIS — C50411 Malignant neoplasm of upper-outer quadrant of right female breast: Secondary | ICD-10-CM

## 2020-08-29 DIAGNOSIS — Z8051 Family history of malignant neoplasm of kidney: Secondary | ICD-10-CM

## 2020-08-29 DIAGNOSIS — Z171 Estrogen receptor negative status [ER-]: Secondary | ICD-10-CM

## 2020-08-29 DIAGNOSIS — Z8042 Family history of malignant neoplasm of prostate: Secondary | ICD-10-CM

## 2020-08-29 LAB — GENETIC SCREENING ORDER

## 2020-08-30 ENCOUNTER — Encounter: Payer: Self-pay | Admitting: Genetic Counselor

## 2020-08-30 DIAGNOSIS — Z8042 Family history of malignant neoplasm of prostate: Secondary | ICD-10-CM

## 2020-08-30 DIAGNOSIS — Z800 Family history of malignant neoplasm of digestive organs: Secondary | ICD-10-CM

## 2020-08-30 DIAGNOSIS — Z8051 Family history of malignant neoplasm of kidney: Secondary | ICD-10-CM

## 2020-08-30 DIAGNOSIS — Z803 Family history of malignant neoplasm of breast: Secondary | ICD-10-CM | POA: Insufficient documentation

## 2020-08-30 HISTORY — DX: Family history of malignant neoplasm of prostate: Z80.42

## 2020-08-30 HISTORY — DX: Family history of malignant neoplasm of kidney: Z80.51

## 2020-08-30 HISTORY — DX: Family history of malignant neoplasm of digestive organs: Z80.0

## 2020-08-30 NOTE — Progress Notes (Signed)
REFERRING PROVIDER: Malachy Mood, MD [address] [place],  [place] [address]  PRIMARY PROVIDER:  Irena Reichmann, DO  PRIMARY REASON FOR VISIT:  1. Malignant neoplasm of upper-outer quadrant of right breast in female, estrogen receptor negative (HCC)   2. Family history of breast cancer   3. Family history of prostate cancer   4. Family history of kidney cancer   5. Family history of colon cancer     HISTORY OF PRESENT ILLNESS:   Julie [name], a 87 y.o. female, was seen for a [place] cancer genetics consultation at the request of Dr. Mosetta Putt due to a personal and family history of cancer.  Julie [name] presents to clinic today with her [name], Julie [name], to discuss the possibility of a hereditary predisposition to cancer, to discuss genetic testing, and to further clarify her future cancer risks, as well as potential cancer risks for family members.   In 2017, at the age of 66, Julie [name] was diagnosed with invasive ductal carcinoma of the left breast (ER-/PR-/HER2-). The treatment plan included lumpectomy, adjuvant chemotherapy, and adjuvant radiation.   CANCER HISTORY:  Oncology History Overview Note  Malignant neoplasm of upper outer quadrant of female breast [facility])   Staging form: Breast, AJCC 7th Edition     Pathologic stage from 11/14/2015: Stage IA (T1c, N0, cM0) - Signed by Malachy Mood, MD on 11/25/2015     Clinical stage from 11/20/2015: Stage IA (T1c, N0, M0) - Signed by Si Gaul, MD on 11/20/2015     Malignant neoplasm of upper outer quadrant of female breast (HCC)  10/09/2015 Mammogram   Screening bilateral mammograms showed a possible left breast mass. Diagnostic mammogram and ultrasound showed a 7 x 7 x 7 mm mass in the left breast at 11:00 position.   10/22/2015 Receptors her2   ER negative, PR negative, HER-2 not amplified   10/22/2015 Initial Biopsy   Left breast needle core biopsy at the 1:00 position showed invasive ductal carcinoma.   10/22/2015 Initial  Diagnosis   Malignant neoplasm of upper outer quadrant of female breast (HCC)   11/14/2015 Surgery   Left breast lumpectomy and sentinel lymph node biopsy [facility])   11/14/2015 Pathology Results   Left breast lumpectomy showed invasive ductal carcinoma, grade 3, 1.1 cm, resection margins were negative, sentinel lymph node biopsy showed benign breast parenchyma, lymph node tissue not identified. Repeated ER PR and HER-2 were all negative.   12/20/2015 - 03/13/2016 Chemotherapy   Weekly Taxol 80 mg/m x 2 cycles, then changed to Abraxane 100 mg/m for cycles 3-12 due to infusion reaction. Abraxane dose-reduced cycle #9, then again for cycles #11 & #12 due to neuropathy. [total 12 cycles chemo]    04/01/2016 - 05/11/2016 Radiation Therapy   Adjuvant breast radiation ([place]). Left breast: 50.4 Gy in 28 fractions   06/19/2016 Imaging   MR Lumbar Spine without contrast  IMPRESSION: 1. Diffuse lumbar spondylosis with discogenic and facet degenerative changes progressed from 2010 lumbar MRI. 2. Multilevel canal stenosis is mild-to-moderate at L2-3, moderate L3-4, and mild at L4-5. 3. Multiple levels of mild and moderate neural foraminal narrowing with severe foraminal narrowing at the right L3-4 and L4-5 levels.   11/12/2017 Imaging   11/12/2017 DEXA ASSESSMENT: The BMD measured at Forearm Radius 33% is 0.648 g/cm2 with a T-score of -2.7. This patient is considered osteoporotic according to World Health Organization [facility]) criteria.   11/12/2017 Mammogram   11/12/2017 Diagnostic Mammogram IMPRESSION: No mammographic evidence of malignancy in either breast, status  post left lumpectomy.    RISK FACTORS:  Menarche was at age 27.  First live birth at age 51.  Ovaries intact: no.  Hysterectomy: yes.  Menopausal status: postmenopausal.  Colonoscopy: yes; approx 5-10 years ago. Mammogram within the last year: yes. Number of breast biopsies: 1.   Past Medical History:  Diagnosis Date   . Anemia   . Anxiety    takes Klonopin at bedtime  . Arthritis    takes Methotrexate weekly and Prednisone  . Blood transfusion    no abnormal reaction noted  . Cancer (HCC)    breast   . Cataracts, bilateral    immature  . Complication of anesthesia    wakes up during surgery  . Depression   . Diabetes mellitus without complication ([place])   . Dyspnea    with exertion since chemo   . Family history of colon cancer 08/30/2020  . Family history of kidney cancer 08/30/2020  . Family history of prostate cancer 08/30/2020  . Fibromyalgia    takes Lyrica daily  . GERD (gastroesophageal reflux disease)    takes Nexium daily  . Glaucoma   . Hard of hearing   . Heart murmur   . Histoplasmosis   . History of colon polyps   . History of gout   . History of hiatal hernia   . History of shingles   . Hyperlipidemia   . Hypertension    takes Amlodipine,Metoprolol,and Losartan daily  . Incontinence of bowel   . Interstitial cystitis   . Joint pain   . Low back pain   . Nocturia   . Peripheral neuropathy   . Peripheral neuropathy   . Personal history of chemotherapy   . Personal history of radiation therapy   . PONV (postoperative nausea and vomiting)   . Ulcer 1970   duodenal  . Urinary frequency   . Urinary urgency   . Weakness    numbness in both hands and feet    Past Surgical History:  Procedure Laterality Date  . ABDOMINAL HYSTERECTOMY    . accupuncture  3 yrs ago  . ANKLE FUSION Left 05/10/2014   Procedure: LEFT ANKLE ARTHRODESIS ;  Surgeon: Wylene Simmer, MD;  Location: [place];  Service: Orthopedics;  Laterality: Left;  . ANKLE SURGERY Left   . arm surgery Left    radius  . bladder tacked     . BREAST LUMPECTOMY Left 10/2015  . CERVICAL FUSION    . CHOLECYSTECTOMY    . COLONOSCOPY    . CYSTOCELE REPAIR    . DILATION AND CURETTAGE OF UTERUS    . ESOPHAGOGASTRODUODENOSCOPY    . PORT A CATH REVISION N/A 12/27/2015   Procedure: PORT A CATH REVISION;  Surgeon: Rolm Bookbinder, MD;  Location: [place];  Service: General;  Laterality: N/A;  . PORT-A-CATH REMOVAL N/A 03/25/2016   Procedure: REMOVAL PORT-A-CATH;  Surgeon: Rolm Bookbinder, MD;  Location: [facility];  Service: General;  Laterality: N/A;  . PORTACATH PLACEMENT Right 12/27/2015   Procedure: INSERTION PORT-A-CATH WITH ULTRASOUND ;  Surgeon: Rolm Bookbinder, MD;  Location: [place];  Service: General;  Laterality: Right;  . RADIOACTIVE SEED GUIDED PARTIAL MASTECTOMY WITH AXILLARY SENTINEL LYMPH NODE BIOPSY Left 11/14/2015   Procedure: RADIOACTIVE SEED GUIDED LEFT BREAST LUMPECTOMY WITH AXILLARY SENTINEL LYMPH NODE BIOPSY;  Surgeon: Rolm Bookbinder, MD;  Location: [place];  Service: General;  Laterality: Left;  RADIOACTIVE SEED GUIDED LEFT BREAST LUMPECTOMY WITH AXILLARY  SENTINEL LYMPH NODE BIOPSY  . SALPINGOOPHORECTOMY Bilateral   . TOTAL KNEE ARTHROPLASTY Right 02/28/2015   Procedure: RIGHT TOTAL KNEE ARTHROPLASTY;  Surgeon: Samson Frederic, MD;  Location: [facility];  Service: Orthopedics;  Laterality: Right;  . UPPER GASTROINTESTINAL ENDOSCOPY    . VAGINAL HYSTERECTOMY      Social History   Socioeconomic History  . Marital status: Divorced    Spouse name: Not on file  . Number of children: 5  . Years of education: some college  . Highest education level: Not on file  Occupational History  . Occupation: Retired  Tobacco Use  . Smoking status: Never Smoker  . Smokeless tobacco: Never Used  Vaping Use  . Vaping Use: Never used  Substance and Sexual Activity  . Alcohol use: No    Comment: IN [place] ONLY  . Drug use: No  . Sexual activity: Not on file  Other Topics Concern  . Not on file  Social History Narrative   Lives at home with her [name].   Right-handed.   1 cup caffeine per day.   Social Determinants of Health   Financial Resource Strain: Not on file  Food Insecurity: Not on file  Transportation Needs: Not on file  Physical  Activity: Not on file  Stress: Not on file  Social Connections: Not on file     FAMILY HISTORY:  We obtained a detailed, 4-generation family history.  Significant diagnoses are listed below: Family History  Problem Relation Age of Onset  . Colon cancer Sister 80  . Kidney cancer Brother        dx after 23  . Breast cancer Mother        dx after 34  . Brain cancer Other   . Other Father        cerebral hemorrhage  . Breast cancer Maternal Aunt        several  . Prostate cancer Brother        dx after 70    Julie [name] has two sons and three daughters, all without a cancer history.  One of her grandsons had Wilms tumor diagnosed at age 6.  Julie [name] has 11 siblings.  Several have a cancer history, including a brother kidney cancer diagnosed after age 79, a brother with prostate cancer diagnosed after age 39, and a sister with colon cancer diagnosed after the age of 40.  Julie [name] mother died at age 49 and had a history of breast cancer diagnosed after age 49.  She has several maternal aunts with breast cancer.  Julie [name] father died at age 54 and did not have cancer.  Julie [name] had limited information about her paternal family.   Julie [name] is unaware of previous family history of genetic testing for hereditary cancer risks. Patient's maternal ancestors are of [place] descent, and paternal ancestors are of [place] descent. There is no reported Ashkenazi Jewish ancestry. There is no known consanguinity.  GENETIC COUNSELING ASSESSMENT: Julie [name] is a 87 y.o. female with a personal and family history of cancer which is somewhat suggestive of a hereditary cancer syndrome and predisposition to cancer given her history of triple negative breast cancer and the presence of related cancers in her siblings and maternal family members. We, therefore, discussed and recommended the following at today's visit.   DISCUSSION: We discussed that 5 - 10% of cancer is hereditary, with  most cases of hereditary breast cancer associated with mutations in BRCA1/2.  There are other  genes that can be associated with hereditary breast cancer syndromes.  Type of cancer risk and level of risk are gene-specific.  We discussed that testing is beneficial for several reasons including knowing how to follow individuals for their cancer risks and understanding if other family members could be at risk for cancer and allowing them to undergo genetic testing.   We reviewed the characteristics, features and inheritance patterns of hereditary cancer syndromes. We also discussed genetic testing, including the appropriate family members to test, the process of testing, insurance coverage and turn-around-time for results. We discussed the implications of a negative, positive, carrier and/or variant of uncertain significant result. We recommended Julie [name] pursue genetic testing for a panel that includes genes associated with breast, kidney, colon, prostate cancer and Wilms tumor.  The Multi-Cancer + RNA Panel offered by [facility] includes sequencing and/or deletion/duplication analysis of the following 84 genes:  AIP*, ALK, APC*, ATM*, AXIN2*, BAP1*, BARD1*, BLM*, BMPR1A*, BRCA1*, BRCA2*, BRIP1*, CASR, CDC73*, CDH1*, CDK4, CDKN1B*, CDKN1C*, CDKN2A, CEBPA, CHEK2*, CTNNA1*, DICER1*, DIS3L2*, EGFR, EPCAM, FH*, FLCN*, GATA2*, GPC3, GREM1, HOXB13, HRAS, KIT, MAX*, MEN1*, MET, MITF, MLH1*, MSH2*, MSH3*, MSH6*, MUTYH*, NBN*, NF1*, NF2*, NTHL1*, PALB2*, PDGFRA, PHOX2B, PMS2*, POLD1*, POLE*, POT1*, PRKAR1A*, PTCH1*, PTEN*, RAD50*, RAD51C*, RAD51D*, RB1*, RECQL4, RET, RUNX1*, SDHA*, SDHAF2*, SDHB*, SDHC*, SDHD*, SMAD4*, SMARCA4*, SMARCB1*, SMARCE1*, STK11*, SUFU*, TERC, TERT, TMEM127*, Tp53*, TSC1*, TSC2*, VHL*, WRN*, and WT1.  RNA analysis is performed for * genes.  Based on Julie [name]'s personal and family history of cancer, she meets medical criteria for genetic testing. Despite that she meets criteria, she may still  have an out of pocket cost.   PLAN: After considering the risks, benefits, and limitations, Julie [name] provided informed consent to pursue genetic testing and the blood sample was sent to [facility] for analysis of the Multi-Cancer +RNA Panel. Results should be available within approximately 3 weeks' time, at which point they will be disclosed by telephone to Julie [name], Julie [name], per Ms. Dupuy's request, as will any additional recommendations warranted by these results. Julie [name] will receive a summary of her genetic counseling visit and a copy of her results once available. This information will also be available in Epic.   Lastly, we encouraged Julie [name] to remain in contact with cancer genetics annually so that we can continuously update the family history and inform her of any changes in cancer genetics and testing that may be of benefit for this family.   Ms. Armentrout's questions were answered to her satisfaction today. Our contact information was provided should additional questions or concerns arise. Thank you for the referral and allowing [place] to share in the care of your patient.   Quatisha Zylka M. Joette Catching, [place], [facility] Genetic Counselor [email or web address] (P) [phone]  The patient was seen for a total of 35 minutes in face-to-face genetic counseling.  Drs. Magrinat, Lindi Adie and/or Burr Medico were available to discuss this case as needed.    _______________________________________________________________________ For Office Staff:  Number of people involved in session: 1 Was an Intern/ student involved with case: no

## 2020-09-02 ENCOUNTER — Ambulatory Visit: Payer: Medicare Other

## 2020-09-03 DIAGNOSIS — M8580 Other specified disorders of bone density and structure, unspecified site: Secondary | ICD-10-CM | POA: Diagnosis not present

## 2020-09-03 DIAGNOSIS — D472 Monoclonal gammopathy: Secondary | ICD-10-CM | POA: Diagnosis not present

## 2020-09-03 DIAGNOSIS — R700 Elevated erythrocyte sedimentation rate: Secondary | ICD-10-CM | POA: Diagnosis not present

## 2020-09-03 DIAGNOSIS — M0579 Rheumatoid arthritis with rheumatoid factor of multiple sites without organ or systems involvement: Secondary | ICD-10-CM | POA: Diagnosis not present

## 2020-09-03 DIAGNOSIS — Z79899 Other long term (current) drug therapy: Secondary | ICD-10-CM | POA: Diagnosis not present

## 2020-09-03 DIAGNOSIS — M130 Polyarthritis, unspecified: Secondary | ICD-10-CM | POA: Diagnosis not present

## 2020-09-03 DIAGNOSIS — M549 Dorsalgia, unspecified: Secondary | ICD-10-CM | POA: Diagnosis not present

## 2020-09-03 DIAGNOSIS — M4800 Spinal stenosis, site unspecified: Secondary | ICD-10-CM | POA: Diagnosis not present

## 2020-09-03 DIAGNOSIS — M5137 Other intervertebral disc degeneration, lumbosacral region: Secondary | ICD-10-CM | POA: Diagnosis not present

## 2020-09-03 DIAGNOSIS — M5030 Other cervical disc degeneration, unspecified cervical region: Secondary | ICD-10-CM | POA: Diagnosis not present

## 2020-09-03 DIAGNOSIS — M25532 Pain in left wrist: Secondary | ICD-10-CM | POA: Diagnosis not present

## 2020-09-04 ENCOUNTER — Ambulatory Visit: Payer: Medicare Other

## 2020-09-09 ENCOUNTER — Ambulatory Visit: Payer: Medicare Other | Admitting: Physical Therapy

## 2020-09-11 ENCOUNTER — Ambulatory Visit: Payer: Medicare Other | Admitting: Physical Therapy

## 2020-09-16 ENCOUNTER — Telehealth: Payer: Self-pay | Admitting: Genetic Counselor

## 2020-09-16 ENCOUNTER — Encounter: Payer: Self-pay | Admitting: Genetic Counselor

## 2020-09-16 DIAGNOSIS — Z1509 Genetic susceptibility to other malignant neoplasm: Secondary | ICD-10-CM

## 2020-09-16 DIAGNOSIS — Z1379 Encounter for other screening for genetic and chromosomal anomalies: Secondary | ICD-10-CM | POA: Insufficient documentation

## 2020-09-16 DIAGNOSIS — Z15068 Genetic susceptibility to other malignant neoplasm of digestive system: Secondary | ICD-10-CM | POA: Insufficient documentation

## 2020-09-16 DIAGNOSIS — Z1501 Genetic susceptibility to malignant neoplasm of breast: Secondary | ICD-10-CM | POA: Insufficient documentation

## 2020-09-16 HISTORY — DX: Genetic susceptibility to malignant neoplasm of breast: Z15.01

## 2020-09-16 HISTORY — DX: Genetic susceptibility to other malignant neoplasm: Z15.09

## 2020-09-16 NOTE — Telephone Encounter (Addendum)
Daughter [name] called with results of Julie [name]'s genetic testing per the request of Ms. Mario.  Discussed pathogenic variant in BRCA2, cancers associated in BRCA2, and implications for family members.  Appointment for [name]'s daughters set up to discuss results in further depth.  Ingrid requested to disclose result to Ms. Beeck directly.

## 2020-09-19 DIAGNOSIS — H6120 Impacted cerumen, unspecified ear: Secondary | ICD-10-CM | POA: Diagnosis not present

## 2020-09-23 DIAGNOSIS — M7061 Trochanteric bursitis, right hip: Secondary | ICD-10-CM | POA: Diagnosis not present

## 2020-09-23 DIAGNOSIS — E669 Obesity, unspecified: Secondary | ICD-10-CM | POA: Diagnosis not present

## 2020-09-23 DIAGNOSIS — M797 Fibromyalgia: Secondary | ICD-10-CM | POA: Diagnosis not present

## 2020-09-23 DIAGNOSIS — Z79899 Other long term (current) drug therapy: Secondary | ICD-10-CM | POA: Diagnosis not present

## 2020-09-23 DIAGNOSIS — E119 Type 2 diabetes mellitus without complications: Secondary | ICD-10-CM | POA: Diagnosis not present

## 2020-09-23 DIAGNOSIS — Z9181 History of falling: Secondary | ICD-10-CM | POA: Diagnosis not present

## 2020-09-23 DIAGNOSIS — I00 Rheumatic fever without heart involvement: Secondary | ICD-10-CM | POA: Diagnosis not present

## 2020-09-23 DIAGNOSIS — M545 Low back pain, unspecified: Secondary | ICD-10-CM | POA: Diagnosis not present

## 2020-09-30 ENCOUNTER — Encounter: Payer: Self-pay | Admitting: Genetic Counselor

## 2020-10-01 DIAGNOSIS — Z9181 History of falling: Secondary | ICD-10-CM | POA: Diagnosis not present

## 2020-10-01 DIAGNOSIS — M5440 Lumbago with sciatica, unspecified side: Secondary | ICD-10-CM | POA: Diagnosis not present

## 2020-10-01 DIAGNOSIS — Z6836 Body mass index (BMI) 36.0-36.9, adult: Secondary | ICD-10-CM | POA: Diagnosis not present

## 2020-10-07 NOTE — Progress Notes (Signed)
PATIENT: Julie [name] DOB: 02-06-1933  Chief Complaint  Patient presents with  . Follow-up    New room with daughter (Julie [name]) Pt is well and stable, buttocks and legs continue to hurt      HISTORICAL  Julie [name] is a 87 year old female, seen in request by her primary care physician Dr. Theda [name], Julie [name], for evaluation of bilateral hands and feet paresthesia, initial evaluation was on January 09, 2019.  I have reviewed and summarized the referring note from the referring physician.   Past medical history of hypertension, hyperlipidemia, left breast cancer, status post left lobectomy, chemotherapy, in 2017, during that time, she developed bilateral fingertips and feet paresthesia, also had a history of chronic low back pain, carries a diagnosis of lumbar stenosis, has gradual onset gait abnormality  Over the past 3 years, she continue to experience worsening bilateral fingertips and feet paresthesia, numbness tingling stay at the bottom of her feet, and toes, she also complains of worsening low back pain, radiating pain to bilateral lower extremities, last week while driving, she described difficulty moving her right leg off the paddle, she also complains of bilateral hands tremor, difficulty holding on small object  Hospital admission in July 2020 for generalized whole body achy pain, weakness, there was also described episode of staring spells at emergency room,  Echocardiogram December 14, 2018: Ejection fraction 60 to 65%, cavity size was normal, mild increased left ventricular wall thickness  MRI of the brain without contrast on March 13, 2018: No acute abnormality, moderate supratentorium small vessel disease  MRI of lumbar in January 2018, multilevel degenerative disc disease, mild to moderate canal stenosis at L2-3, L3-4, mild at L4-5, with variable degree of neural foraminal narrowing, severe neural foraminal narrowing at right L3-4, L4-5,  MRI cervical spine September 2020:  Multilevel degenerative changes, evidence of anterior cervical discectomy and fusion at C 4, 5, 6  Laboratory evaluation in 2020 showed normal TSH, CBC, CMP showed mild elevated glucose 115, normal CPK, negative HIV, RPR, A1c of 6.0, mild elevated M spike 0.8 DL on protein electrophoresis, normal B12, C-reactive protein,  EMG nerve conduction study in August 2020 showed no large fiber peripheral neuropathy  UPDATE December 14 2019: She was seen in 2020 for evaluation of intermittent bilateral upper and lower extremity paresthesia, chronic low back pain, gait abnormality, she was referred for physical therapy, which has helped her, but never carried through the water aerobic as suggested  She spent most of the time watching TV, different game show, not physically active, since 2020, she developed gradual onset more noticeable intermittent body shaking, also complains of generalized weakness, weak handgrip, using both hands to hold a cup,  She is currently under pain management, taking Lyrica, also oxycodone, she had recent flareup of her arthritis pain, was treated with low dose of steroid  She now has caregiver coming every other day, spent a few hours with her, she enjoying the trip to different shops,  She also has mild memory loss, word finding difficulties, denies family history of dementia, is on polypharmacy treatment, including oxycodone 5 mg 3 times a day, Lyrica 75 mg  UPDATE Jun 17 2020: She is accompanied by her daughter at today's clinical visit, she complains of constant multiple joints pain, was seen by rheumatologist, taking prednisone 7.5 mg daily, also Lyrica 75 mg 3 times a day, sleep well, has good appetite, also take frequent naps during the day  Today her main concern is slow worsening memory  loss, left stove on, no longer feel comfortable using sharp knives, has caregiver coming with her few times each week, family do not feel safe to leave her alone, occasionally agitated,  increased urine urinary tract infection, improve afterwards.  NoW daughter is managing her [name] now  Update Oct 07, 2020 SS: Here today with Julie [name], her daughter. Memory is stable. Caregiver a few days a weeks, Julie [name]. Isn't left alone more than 1 hour, recently stay whole day alone. She watches TV.   MRI of the brain in February 2022 showed no acute abnormality, stable atrophy, moderate small vessel disease.  Referred to PT. Was helpful for balance and strength, isn't doing the exercises at home anymore.   Attempted to reduce oxycodone, with pain management due to pain level 0/10, tried to reduce it, had flare up, went back up, may try again.   Stopped amitriptyline at night, sleeping is doing better, going to bed earlier 11 PM.  She would like to get back into water physical therapy. Finished her sessions last summer. They do help to loosen her muscles.   Sees oncology for history of breast cancer, annual follow-up.  REVIEW OF SYSTEMS: Full 14 system review of systems performed and notable only for as above  See HPI  ALLERGIES: Allergies  Allergen Reactions  . Codeine Shortness Of Breath  . Other     Walnuts-anaphylaxis (patient denies allergy states that she just does not eat WALNUTS)  . Aspirin Other (See Comments)    Duodenal ulcer- cannot take aspirin when active.  HYPERSENSITIVE  TO  REGULAR DOSE.      HOME [name]: Current Outpatient [name]  Medication Sig Dispense Refill  . amLODipine (NORVASC) 10 MG tablet Take 10 mg by mouth daily.    . cholecalciferol (VITAMIN D3) 25 MCG (1000 UNIT) tablet Take 1,000 Units by mouth daily.    . diclofenac Sodium (VOLTAREN) 1 % GEL SMARTSIG:3 Inch(es) Topical 3 Times Daily PRN    . folic acid (FOLVITE) 1 MG tablet Take 2 mg by mouth every morning.    . hydroxychloroquine (PLAQUENIL) 200 MG tablet Take 200 mg by mouth daily.    [name] LIDOCAINE PAIN RELIEF 4 % PTCH Apply 1 patch topically 2 (two)  times daily.    [name] lovastatin (MEVACOR) 40 MG tablet 1 (ONE) TABLET TAKE IN THE EVENING AFTER DINNER (Patient taking differently: Take 40 mg by mouth every evening.) 90 tablet 1  . metoprolol succinate (TOPROL-XL) 50 MG 24 hr tablet Take 50 mg by mouth every evening.   2  . omeprazole (PRILOSEC) 40 MG capsule Take 40 mg by mouth daily.    [name] oxyCODONE (OXY IR/ROXICODONE) 5 MG immediate release tablet Take 5 mg by mouth 3 (three) times daily as needed.    . polyethylene glycol (MIRALAX / GLYCOLAX) 17 g packet Take 17 g by mouth daily.    . predniSONE (DELTASONE) 5 MG tablet Take 7.5 mg by mouth daily.    . pregabalin (LYRICA) 75 MG capsule Take 75-150 mg by mouth See admin instructions. 75 Mg in the Am and 150 MG at bedtime    . valsartan-hydrochlorothiazide (DIOVAN-HCT) 320-12.5 MG tablet Take 0.5 tablets by mouth daily.     No current facility-administered [name] for this visit.    PAST MEDICAL HISTORY: Past Medical History:  Diagnosis Date  . Anemia   . Anxiety    takes Klonopin at bedtime  . Arthritis    takes Methotrexate weekly and Prednisone  .  Blood transfusion    no abnormal reaction noted  . BRCA2 gene mutation positive 09/16/2020  . Cancer (HCC)    breast   . Cataracts, bilateral    immature  . Complication of anesthesia    wakes up during surgery  . Depression   . Diabetes mellitus without complication ([place])   . Dyspnea    with exertion since chemo   . Family history of colon cancer 08/30/2020  . Family history of kidney cancer 08/30/2020  . Family history of prostate cancer 08/30/2020  . Fibromyalgia    takes Lyrica daily  . GERD (gastroesophageal reflux disease)    takes Nexium daily  . Glaucoma   . Hard of hearing   . Heart murmur   . Histoplasmosis   . History of colon polyps   . History of gout   . History of hiatal hernia   . History of shingles   . Hyperlipidemia   . Hypertension    takes Amlodipine,Metoprolol,and Losartan daily  . Incontinence of  bowel   . Interstitial cystitis   . Joint pain   . Low back pain   . Nocturia   . Peripheral neuropathy   . Peripheral neuropathy   . Personal history of chemotherapy   . Personal history of radiation therapy   . PONV (postoperative nausea and vomiting)   . Ulcer 1970   duodenal  . Urinary frequency   . Urinary urgency   . Weakness    numbness in both hands and feet    PAST SURGICAL HISTORY: Past Surgical History:  Procedure Laterality Date  . ABDOMINAL HYSTERECTOMY    . accupuncture  3 yrs ago  . ANKLE FUSION Left 05/10/2014   Procedure: LEFT ANKLE ARTHRODESIS ;  Surgeon: Wylene Simmer, MD;  Location: [facility];  Service: Orthopedics;  Laterality: Left;  . ANKLE SURGERY Left   . arm surgery Left    radius  . bladder tacked     . BREAST LUMPECTOMY Left 10/2015  . CERVICAL FUSION    . CHOLECYSTECTOMY    . COLONOSCOPY    . CYSTOCELE REPAIR    . DILATION AND CURETTAGE OF UTERUS    . ESOPHAGOGASTRODUODENOSCOPY    . PORT A CATH REVISION N/A 12/27/2015   Procedure: PORT A CATH REVISION;  Surgeon: Rolm Bookbinder, MD;  Location: [facility];  Service: General;  Laterality: N/A;  . PORT-A-CATH REMOVAL N/A 03/25/2016   Procedure: REMOVAL PORT-A-CATH;  Surgeon: Rolm Bookbinder, MD;  Location: [facility];  Service: General;  Laterality: N/A;  . PORTACATH PLACEMENT Right 12/27/2015   Procedure: INSERTION PORT-A-CATH WITH ULTRASOUND ;  Surgeon: Rolm Bookbinder, MD;  Location: [facility];  Service: General;  Laterality: Right;  . RADIOACTIVE SEED GUIDED PARTIAL MASTECTOMY WITH AXILLARY SENTINEL LYMPH NODE BIOPSY Left 11/14/2015   Procedure: RADIOACTIVE SEED GUIDED LEFT BREAST LUMPECTOMY WITH AXILLARY SENTINEL LYMPH NODE BIOPSY;  Surgeon: Rolm Bookbinder, MD;  Location: [facility];  Service: General;  Laterality: Left;  RADIOACTIVE SEED GUIDED LEFT BREAST LUMPECTOMY WITH AXILLARY SENTINEL LYMPH NODE BIOPSY  . SALPINGOOPHORECTOMY Bilateral   . TOTAL  KNEE ARTHROPLASTY Right 02/28/2015   Procedure: RIGHT TOTAL KNEE ARTHROPLASTY;  Surgeon: Rod Can, MD;  Location: [facility];  Service: Orthopedics;  Laterality: Right;  . UPPER GASTROINTESTINAL ENDOSCOPY    . VAGINAL HYSTERECTOMY      FAMILY HISTORY: Family History  Problem Relation Age of Onset  . Colon cancer Sister 34  . Kidney cancer Brother  dx after 50  . Breast cancer Mother        dx after 61  . Brain cancer Other   . Other Father        cerebral hemorrhage  . Breast cancer Maternal Aunt        several  . Prostate cancer Brother        dx after 79    SOCIAL HISTORY: Social History   Socioeconomic History  . Marital status: Divorced    Spouse name: Not on file  . Number of children: 5  . Years of education: some college  . Highest education level: Not on file  Occupational History  . Occupation: Retired  Tobacco Use  . Smoking status: Never Smoker  . Smokeless tobacco: Never Used  Vaping Use  . Vaping Use: Never used  Substance and Sexual Activity  . Alcohol use: No    Comment: IN [place] ONLY  . Drug use: No  . Sexual activity: Not on file  Other Topics Concern  . Not on file  Social History Narrative   Lives at home with her daughter.   Right-handed.   1 cup caffeine per day.   Social Determinants of Health   Financial Resource Strain: Not on file  Food Insecurity: Not on file  Transportation Needs: Not on file  Physical Activity: Not on file  Stress: Not on file  Social Connections: Not on file  Intimate Partner Violence: Not on file     PHYSICAL EXAM BP 150/70, HR 57  PHYSICAL EXAMNIATION:  Gen: NAD, conversant, well nourised, obese, well groomed, pleasant, cooperative, laughing                   Cardiovascular: Regular rate rhythm, no peripheral edema, warm, nontender. Eyes: Conjunctivae clear without exudates or hemorrhage Pulmonary: Clear to auscultation bilaterally   NEUROLOGICAL EXAM:  MENTAL STATUS: MMSE - Mini Mental  State Exam 06/17/2020 12/14/2019  Orientation to time 5 4  Orientation to Place 5 5  Registration 3 1  Attention/ Calculation 5 3  Recall 1 3  Language- name 2 objects 2 2  Language- repeat 1 1  Language- follow 3 step command 3 2  Language- read & follow direction 1 1  Write a sentence 1 1  Copy design 0 0  Total score 27 23     CRANIAL NERVES: CN II: Visual fields are full to confrontation  Pupils are round equal and briskly reactive to light. CN III, IV, VI: extraocular movement are normal. No ptosis. . CN VII: Face is symmetric with normal eye closure and smile. CN VIII: Hearing is normal to casual conversation bilaterally CN XI: Head turning and shoulder shrug are intact  MOTOR: Good strength of all extremities  REFLEXES: Reflexes are 1+ throughout  SENSORY: Soft touch sensation intact throughout  COORDINATION: Finger-nose-finger and heel-to-shin are normal bilaterally  GAIT/STANCE: She needs push-up to get up from seated position, gait is slightly wide-based, but steady, uses rolling walker, able to sit on exam table with legs crossed on the table  DIAGNOSTIC DATA (LABS, IMAGING, TESTING) - I reviewed patient records, labs, notes, testing and imaging myself where available.  ASSESSMENT AND PLAN  RAYETTA VEITH is a 87 y.o. female   1. Worsening cognitive impairment 2. Gait abnormalities 3. Cerebrovascular disease   -Appears overall stable, and doing well -Felt her memory concerns are likely combination of aging, central nervous system degenerative disorder, with superimposed vascular component, could be medication side effect -  Feels memory is stable, recent MMSE 27/30 -MRI of the brain in February 2022 showed no acute abnormality, stable mild atrophy, moderate small vessel disease -Will refer to aquatic physical therapy, had good benefit from PT prior  -On polypharmacy treatment for chronic pain including oxycodone, Lyrica, prednisone, Plaquenil; sees pain  management and rheumatology -Follow-up in 1 year or sooner if needed  Butler Denmark, Laqueta Jean, DNP  [facility] [address], [place], [place] [address] [phone]

## 2020-10-08 ENCOUNTER — Ambulatory Visit (INDEPENDENT_AMBULATORY_CARE_PROVIDER_SITE_OTHER): Payer: Medicare Other | Admitting: Neurology

## 2020-10-08 ENCOUNTER — Encounter: Payer: Self-pay | Admitting: Neurology

## 2020-10-08 VITALS — BP 150/70 | HR 57 | Ht 64.0 in | Wt 216.0 lb

## 2020-10-08 DIAGNOSIS — R269 Unspecified abnormalities of gait and mobility: Secondary | ICD-10-CM | POA: Diagnosis not present

## 2020-10-08 DIAGNOSIS — I679 Cerebrovascular disease, unspecified: Secondary | ICD-10-CM | POA: Diagnosis not present

## 2020-10-08 DIAGNOSIS — G3184 Mild cognitive impairment, so stated: Secondary | ICD-10-CM

## 2020-10-08 NOTE — Patient Instructions (Signed)
Will order the water aerobics  Continue seeing your specialists  see you back in 1 year

## 2020-10-14 ENCOUNTER — Telehealth: Payer: Self-pay

## 2020-10-14 NOTE — Telephone Encounter (Signed)
I called Neuro Rehab at [facility]. They still offer water aerobics taught by Vinnie Level. I can send referral to neuro rehab and Vinnie Level will review it and call patient to schedule.

## 2020-10-15 ENCOUNTER — Other Ambulatory Visit: Payer: Self-pay

## 2020-10-15 ENCOUNTER — Ambulatory Visit: Payer: Medicare Other | Attending: Neurology | Admitting: Physical Therapy

## 2020-10-15 VITALS — HR 68

## 2020-10-15 DIAGNOSIS — R2681 Unsteadiness on feet: Secondary | ICD-10-CM | POA: Diagnosis not present

## 2020-10-15 DIAGNOSIS — R2689 Other abnormalities of gait and mobility: Secondary | ICD-10-CM | POA: Insufficient documentation

## 2020-10-15 DIAGNOSIS — M6281 Muscle weakness (generalized): Secondary | ICD-10-CM | POA: Diagnosis not present

## 2020-10-15 NOTE — Patient Instructions (Signed)
  Aquatic Therapy: What to Expect!  Where:  [facility] [address] [place], [place]  [address] [phone]  NOTE:  You will receive an automated phone message reminding you of your appointment and it will say the appointment is at the [facility] on [address]  We are working to fix this- just know that you will meet [place] at the pool!  How to Prepare: . Please make sure you drink 8 ounces of water about one hour prior to your pool session . A caregiver MUST attend the entire session with the patient.  The caregiver will be responsible for assisting with dressing as well as any toileting needs.  If the patient will be doing a home program this should likely be the person who will assist as well.  . Patients must wear either their street shoes or pool shoes until they are ready to enter the pool with the therapist.  Patients must also wear either street shoes or pool shoes once exiting the pool to walk to the locker room.  This will helps [place] prevent slips and falls.  . Please arrive 15 minutes early to prepare for your pool therapy session . Sign in at the front desk on the clipboard marked for [place] . You may use the locker rooms on your right and then enter directly into the recreation pool (NOT the competition pool) . Please make sure to attend to any toileting needs prior to entering the pool . Please be dressed in your swim suit and on the pool deck at least 5 minutes before your appointment . Once on the pool deck your therapist will ask you to sign the Patient  Consent and Assignment of Benefits form . Your therapist may take your blood pressure prior to, during and after your session if indicated  About the pool  and parking: 1. Entering the pool Your therapist will assist you; there are 2 ways to enter:  stairs with railings or with a chair lift.   Your therapist will determine the most appropriate way for you. 2. Water temperature is usually  between 86-87 degrees 3. There may be other swimmers in the pool at the same time 4. Parking is free.   Contact Info:     Appointments: [facility]  All sessions are 45 minutes   [address] [place] [address]     Please call the [facility] if   [place], [place]   [address]    you need to cancel or reschedule an appointment.  [phone]

## 2020-10-16 ENCOUNTER — Encounter: Payer: Self-pay | Admitting: Physical Therapy

## 2020-10-16 NOTE — Therapy (Signed)
[facility] [address] [place], [place], [address] Phone: [phone]   Fax:  [phone]  Physical Therapy Evaluation  Patient Details  Name: Julie [name] MRN: [id number] Date of Birth: 1934-12-06 Referring Provider (PT): Margie Ege, NP   Encounter Date: 10/15/2020   PT End of Session - 10/16/20 2031    Visit Number 1    Number of Visits 7   eval + 6 visits   Date for PT Re-Evaluation 11/29/20    Authorization Type Medicare    Authorization Time Period 10-15-20 - 12-15-20    PT Start Time 0932    PT Stop Time 1015    PT Time Calculation (min) 43 min    Activity Tolerance Patient tolerated treatment well    Behavior During Therapy [facility] for tasks assessed/performed           Past Medical History:  Diagnosis Date  . Anemia   . Anxiety    takes Klonopin at bedtime  . Arthritis    takes Methotrexate weekly and Prednisone  . Blood transfusion    no abnormal reaction noted  . BRCA2 gene mutation positive 09/16/2020  . Cancer (HCC)    breast   . Cataracts, bilateral    immature  . Complication of anesthesia    wakes up during surgery  . Depression   . Diabetes mellitus without complication (HCC)   . Dyspnea    with exertion since chemo   . Family history of colon cancer 08/30/2020  . Family history of kidney cancer 08/30/2020  . Family history of prostate cancer 08/30/2020  . Fibromyalgia    takes Lyrica daily  . GERD (gastroesophageal reflux disease)    takes Nexium daily  . Glaucoma   . Hard of hearing   . Heart murmur   . Histoplasmosis   . History of colon polyps   . History of gout   . History of hiatal hernia   . History of shingles   . Hyperlipidemia   . Hypertension    takes Amlodipine,Metoprolol,and Losartan daily  . Incontinence of bowel   . Interstitial cystitis   . Joint pain   . Low back pain   . Nocturia   . Peripheral neuropathy   . Peripheral neuropathy   . Personal history of  chemotherapy   . Personal history of radiation therapy   . PONV (postoperative nausea and vomiting)   . Ulcer 1970   duodenal  . Urinary frequency   . Urinary urgency   . Weakness    numbness in both hands and feet    Past Surgical History:  Procedure Laterality Date  . ABDOMINAL HYSTERECTOMY    . accupuncture  3 yrs ago  . ANKLE FUSION Left 05/10/2014   Procedure: LEFT ANKLE ARTHRODESIS ;  Surgeon: Toni Arthurs, MD;  Location: [facility] OR;  Service: Orthopedics;  Laterality: Left;  . ANKLE SURGERY Left   . arm surgery Left    radius  . bladder tacked     . BREAST LUMPECTOMY Left 10/2015  . CERVICAL FUSION    . CHOLECYSTECTOMY    . COLONOSCOPY    . CYSTOCELE REPAIR    . DILATION AND CURETTAGE OF UTERUS    . ESOPHAGOGASTRODUODENOSCOPY    . PORT A CATH REVISION N/A 12/27/2015   Procedure: PORT A CATH REVISION;  Surgeon: Emelia Loron, MD;  Location: [facility];  Service: General;  Laterality: N/A;  . PORT-A-CATH REMOVAL N/A 03/25/2016  Procedure: REMOVAL PORT-A-CATH;  Surgeon: Rolm Bookbinder, MD;  Location: [facility] ORS;  Service: General;  Laterality: N/A;  . PORTACATH PLACEMENT Right 12/27/2015   Procedure: INSERTION PORT-A-CATH WITH ULTRASOUND ;  Surgeon: Rolm Bookbinder, MD;  Location: [facility];  Service: General;  Laterality: Right;  . RADIOACTIVE SEED GUIDED PARTIAL MASTECTOMY WITH AXILLARY SENTINEL LYMPH NODE BIOPSY Left 11/14/2015   Procedure: RADIOACTIVE SEED GUIDED LEFT BREAST LUMPECTOMY WITH AXILLARY SENTINEL LYMPH NODE BIOPSY;  Surgeon: Rolm Bookbinder, MD;  Location: [facility];  Service: General;  Laterality: Left;  RADIOACTIVE SEED GUIDED LEFT BREAST LUMPECTOMY WITH AXILLARY SENTINEL LYMPH NODE BIOPSY  . SALPINGOOPHORECTOMY Bilateral   . TOTAL KNEE ARTHROPLASTY Right 02/28/2015   Procedure: RIGHT TOTAL KNEE ARTHROPLASTY;  Surgeon: Rod Can, MD;  Location: [facility];  Service: Orthopedics;  Laterality: Right;  . UPPER  GASTROINTESTINAL ENDOSCOPY    . VAGINAL HYSTERECTOMY      Vitals:   10/15/20 0940  Pulse: 68  SpO2: 94%          [facility] PT Assessment - 10/16/20 0001      Assessment   Medical Diagnosis Gait Abnormality    Referring Provider (PT) Butler Denmark, NP    Onset Date/Surgical Date --   approx. 2019 for decline in mobility:  Referral date 10-08-20   Prior Therapy Oct. 2020 - Jan. 2021:  01-16-20 - 03-04-20      Precautions   Precautions Fall      Balance Screen   Has the patient fallen in the past 6 months No    Has the patient had a decrease in activity level because of a fear of falling?  No    Is the patient reluctant to leave their home because of a fear of falling?  No      Home Environment   Home Access Stairs to enter    Entrance Stairs-Number of Steps 3    Entrance Stairs-Rails Right;Left;Can reach both      Prior Function   Level of Independence Independent with basic ADLs;Independent with household mobility with device;Independent with community mobility with device;Independent with transfers;Requires assistive device for independence      Transfers   Five time sit to stand comments  19.57 - from high low mat table      Ambulation/Gait   Ambulation/Gait Yes    Ambulation/Gait Assistance 5: Supervision    Ambulation/Gait Assistance Details pt uses rollator in the home so that she can transport/carry objects; uses SPC with assistance  when she goes out    Ambulation Distance (Feet) 345 Feet   in 2:45"   Assistive device Rollator    Gait Pattern Step-through pattern    Ambulation Surface Level;Indoor    Gait velocity 14.13 secs with rollator= 2.32 ft/sec      Timed Up and Go Test   Normal TUG (seconds) 19.44   15.78                     Objective measurements completed on examination: See above findings.               PT Education - 10/16/20 2029    Education Details gave pt and daughter info on [facility] for aquatic therapy     Person(s) Educated Patient;Child(ren)    Methods Explanation;Demonstration;Handout    Comprehension Verbalized understanding;Returned demonstration               PT Long Term Goals - 10/16/20 2102  PT LONG TERM GOAL #1   Title Pt will improve 5x sit to stand score from 19.57 secs to </= 15.5 secs without UE support.    Time 6    Period Weeks    Status New    Target Date 11/29/20      PT LONG TERM GOAL #2   Title Pt will negotiate steps in pool with use of 2 hand rails using step over step sequence.    Time 6    Period Weeks    Status New    Target Date 11/29/20      PT LONG TERM GOAL #3   Title Pt will report decreased LBP from 4/10 intensity to </= 2/10 intensity after participation in a 45" aquatic therapy session.    Time 6    Period Weeks    Status New    Target Date 11/29/20      PT LONG TERM GOAL #4   Title Improve TUG score from 19.44 secs with rollator to </= 15 secs for reduced fall risk.    Baseline 19.44 secs with rollator    Time 6    Period Weeks    Status New    Target Date 11/29/20      PT LONG TERM GOAL #5   Title Independent in HEP for strengthening and will verbalize options for continued aquatic exercise programs upon D/C if she so chooses.    Time 6    Period Weeks    Status New    Target Date 11/29/20                  Plan - 10/16/20 2033    Clinical Impression Statement Pt is an 87 yr old lady with decreased LE strength, gait deviations and decreased balance; pt is requesting aquatic therapy only (no land PT sessions) to increase LE strength and to improve balance and gait.  Pt is currently using a rollator for household amb. so that she can carry/transport items; pt states she uses [facility] with someone assisting her as needed with community ambulation.  Pt is at fall risk per TUG score of 19.44 secs with use of rollator.  Pt to be scheduled for 6 aquatic therapy sessions.    Personal Factors and Comorbidities Comorbidity  2;Fitness;Past/Current Experience;Time since onset of injury/illness/exacerbation;Transportation    Comorbidities fibromyalgia, peripheral neuropathy, h/o Lt breast cancer, OA knees, lumbar stenosis, cervical fusion    Examination-Activity Limitations Locomotion Level;Squat;Stairs;Bend;Stand    Examination-Participation Restrictions Meal Prep;Cleaning;Shop;Community Activity;Laundry    Stability/Clinical Decision Making Evolving/Moderate complexity    Clinical Decision Making Moderate    Rehab Potential Good    PT Frequency 1x / week    PT Duration 6 weeks    PT Treatment/Interventions Therapeutic exercise;Aquatic Therapy;ADLs/Self Care Home Management;Gait training;Therapeutic activities;Patient/family education;Neuromuscular re-education;Balance training    PT Next Visit Plan Aquatic therapy    Consulted and Agree with Plan of Care Patient;Family member/caregiver    Family Member Consulted daughter [name]           Patient will benefit from skilled therapeutic intervention in order to improve the following deficits and impairments:  Difficulty walking,Decreased balance,Decreased strength,Pain,Impaired sensation,Abnormal gait,Increased muscle spasms,Decreased endurance,Improper body mechanics,Postural dysfunction,Decreased mobility,Decreased activity tolerance  Visit Diagnosis: Other abnormalities of gait and mobility - Plan: PT plan of care cert/re-cert  Muscle weakness (generalized) - Plan: PT plan of care cert/re-cert  Unsteadiness on feet - Plan: PT plan of care cert/re-cert     Problem List Patient Active Problem  List   Diagnosis Date Noted  . Genetic testing 09/16/2020  . BRCA2 gene mutation positive 09/16/2020  . Family history of breast cancer 08/30/2020  . Family history of prostate cancer 08/30/2020  . Family history of kidney cancer 08/30/2020  . Family history of colon cancer 08/30/2020  . Cerebellar ataxia in diseases classified elsewhere ([place]) 06/17/2020  . Mild  cognitive impairment 12/14/2019  . Cerebral vascular disease 12/14/2019  . Gait abnormality 01/09/2019  . Paresthesia 01/09/2019  . Near syncope   . Weakness   . Essential hypertension   . Generalized weakness 12/13/2018  . Syncope 12/13/2018  . Peripheral neuropathy 06/08/2016  . Low back pain without sciatica 06/08/2016  . Abnormality of gait 06/08/2016  . Anemia due to antineoplastic chemotherapy 03/06/2016  . Hypersensitivity reaction 01/07/2016  . Port catheter in place 01/03/2016  . Malignant neoplasm of upper outer quadrant of female breast ([place]) 10/30/2015  . Primary osteoarthritis of right knee 02/28/2015  . Post-traumatic arthritis of ankle 05/10/2014  . MGUS (monoclonal gammopathy of unknown significance) 02/17/2012  . Diverticulosis of colon (without mention of hemorrhage) 01/21/2011  . Family history of malignant neoplasm of gastrointestinal tract 01/21/2011  . Special screening for malignant neoplasms, colon 01/21/2011  . Other symptoms involving digestive system(787.99) 01/21/2011    Inocencio Roy, [place], PT 10/16/2020, 9:14 PM  [facility] [address] [place], [name], [address] Phone: [phone]   Fax:  [phone]  Name: Julie [name] MRN: [id number] Date of Birth: 26-Apr-1933

## 2020-10-23 DIAGNOSIS — Z23 Encounter for immunization: Secondary | ICD-10-CM | POA: Diagnosis not present

## 2020-10-24 DIAGNOSIS — E119 Type 2 diabetes mellitus without complications: Secondary | ICD-10-CM | POA: Diagnosis not present

## 2020-10-24 DIAGNOSIS — Z9181 History of falling: Secondary | ICD-10-CM | POA: Diagnosis not present

## 2020-10-24 DIAGNOSIS — B079 Viral wart, unspecified: Secondary | ICD-10-CM | POA: Diagnosis not present

## 2020-10-24 DIAGNOSIS — M797 Fibromyalgia: Secondary | ICD-10-CM | POA: Diagnosis not present

## 2020-10-24 DIAGNOSIS — Z79899 Other long term (current) drug therapy: Secondary | ICD-10-CM | POA: Diagnosis not present

## 2020-10-24 DIAGNOSIS — E669 Obesity, unspecified: Secondary | ICD-10-CM | POA: Diagnosis not present

## 2020-10-24 DIAGNOSIS — M545 Low back pain, unspecified: Secondary | ICD-10-CM | POA: Diagnosis not present

## 2020-10-24 DIAGNOSIS — D485 Neoplasm of uncertain behavior of skin: Secondary | ICD-10-CM | POA: Diagnosis not present

## 2020-10-24 DIAGNOSIS — M109 Gout, unspecified: Secondary | ICD-10-CM | POA: Diagnosis not present

## 2020-10-24 DIAGNOSIS — M7061 Trochanteric bursitis, right hip: Secondary | ICD-10-CM | POA: Diagnosis not present

## 2020-10-28 ENCOUNTER — Ambulatory Visit: Payer: Medicare Other | Admitting: Physical Therapy

## 2020-10-30 ENCOUNTER — Other Ambulatory Visit: Payer: Self-pay | Admitting: Hematology

## 2020-10-30 DIAGNOSIS — Z853 Personal history of malignant neoplasm of breast: Secondary | ICD-10-CM

## 2020-11-01 DIAGNOSIS — Z20822 Contact with and (suspected) exposure to covid-19: Secondary | ICD-10-CM | POA: Diagnosis not present

## 2020-11-26 DIAGNOSIS — Z1159 Encounter for screening for other viral diseases: Secondary | ICD-10-CM | POA: Diagnosis not present

## 2020-11-26 DIAGNOSIS — M129 Arthropathy, unspecified: Secondary | ICD-10-CM | POA: Diagnosis not present

## 2020-11-26 DIAGNOSIS — M109 Gout, unspecified: Secondary | ICD-10-CM | POA: Diagnosis not present

## 2020-11-26 DIAGNOSIS — Z9181 History of falling: Secondary | ICD-10-CM | POA: Diagnosis not present

## 2020-11-26 DIAGNOSIS — E669 Obesity, unspecified: Secondary | ICD-10-CM | POA: Diagnosis not present

## 2020-11-26 DIAGNOSIS — M545 Low back pain, unspecified: Secondary | ICD-10-CM | POA: Diagnosis not present

## 2020-11-26 DIAGNOSIS — E118 Type 2 diabetes mellitus with unspecified complications: Secondary | ICD-10-CM | POA: Diagnosis not present

## 2020-11-26 DIAGNOSIS — Z79899 Other long term (current) drug therapy: Secondary | ICD-10-CM | POA: Diagnosis not present

## 2020-11-26 DIAGNOSIS — M797 Fibromyalgia: Secondary | ICD-10-CM | POA: Diagnosis not present

## 2020-11-26 DIAGNOSIS — E559 Vitamin D deficiency, unspecified: Secondary | ICD-10-CM | POA: Diagnosis not present

## 2020-11-26 DIAGNOSIS — M7061 Trochanteric bursitis, right hip: Secondary | ICD-10-CM | POA: Diagnosis not present

## 2020-11-28 DIAGNOSIS — M549 Dorsalgia, unspecified: Secondary | ICD-10-CM | POA: Diagnosis not present

## 2020-11-28 DIAGNOSIS — M25532 Pain in left wrist: Secondary | ICD-10-CM | POA: Diagnosis not present

## 2020-11-28 DIAGNOSIS — D472 Monoclonal gammopathy: Secondary | ICD-10-CM | POA: Diagnosis not present

## 2020-11-28 DIAGNOSIS — R700 Elevated erythrocyte sedimentation rate: Secondary | ICD-10-CM | POA: Diagnosis not present

## 2020-11-28 DIAGNOSIS — M5030 Other cervical disc degeneration, unspecified cervical region: Secondary | ICD-10-CM | POA: Diagnosis not present

## 2020-11-28 DIAGNOSIS — M8580 Other specified disorders of bone density and structure, unspecified site: Secondary | ICD-10-CM | POA: Diagnosis not present

## 2020-11-28 DIAGNOSIS — M0579 Rheumatoid arthritis with rheumatoid factor of multiple sites without organ or systems involvement: Secondary | ICD-10-CM | POA: Diagnosis not present

## 2020-11-28 DIAGNOSIS — Z79899 Other long term (current) drug therapy: Secondary | ICD-10-CM | POA: Diagnosis not present

## 2020-11-28 DIAGNOSIS — M5137 Other intervertebral disc degeneration, lumbosacral region: Secondary | ICD-10-CM | POA: Diagnosis not present

## 2020-11-28 DIAGNOSIS — M4800 Spinal stenosis, site unspecified: Secondary | ICD-10-CM | POA: Diagnosis not present

## 2020-11-28 DIAGNOSIS — M25439 Effusion, unspecified wrist: Secondary | ICD-10-CM | POA: Diagnosis not present

## 2020-11-29 ENCOUNTER — Ambulatory Visit
Admission: RE | Admit: 2020-11-29 | Discharge: 2020-11-29 | Disposition: A | Discharge status: Home / Self Care | Payer: Medicare Other | Source: Ambulatory Visit | Attending: Hematology | Admitting: Hematology

## 2020-11-29 ENCOUNTER — Other Ambulatory Visit: Payer: Self-pay

## 2020-11-29 DIAGNOSIS — R922 Inconclusive mammogram: Secondary | ICD-10-CM | POA: Diagnosis not present

## 2020-11-29 DIAGNOSIS — Z853 Personal history of malignant neoplasm of breast: Secondary | ICD-10-CM | POA: Diagnosis not present

## 2020-12-04 DIAGNOSIS — K219 Gastro-esophageal reflux disease without esophagitis: Secondary | ICD-10-CM | POA: Diagnosis not present

## 2020-12-04 DIAGNOSIS — R1033 Periumbilical pain: Secondary | ICD-10-CM | POA: Diagnosis not present

## 2020-12-04 DIAGNOSIS — R1013 Epigastric pain: Secondary | ICD-10-CM | POA: Diagnosis not present

## 2020-12-16 DIAGNOSIS — K319 Disease of stomach and duodenum, unspecified: Secondary | ICD-10-CM | POA: Diagnosis not present

## 2020-12-16 DIAGNOSIS — R12 Heartburn: Secondary | ICD-10-CM | POA: Diagnosis not present

## 2020-12-16 DIAGNOSIS — R1013 Epigastric pain: Secondary | ICD-10-CM | POA: Diagnosis not present

## 2020-12-16 DIAGNOSIS — K2970 Gastritis, unspecified, without bleeding: Secondary | ICD-10-CM | POA: Diagnosis not present

## 2020-12-16 DIAGNOSIS — K219 Gastro-esophageal reflux disease without esophagitis: Secondary | ICD-10-CM | POA: Diagnosis not present

## 2020-12-19 DIAGNOSIS — E119 Type 2 diabetes mellitus without complications: Secondary | ICD-10-CM | POA: Diagnosis not present

## 2020-12-19 DIAGNOSIS — M069 Rheumatoid arthritis, unspecified: Secondary | ICD-10-CM | POA: Diagnosis not present

## 2020-12-19 DIAGNOSIS — Z961 Presence of intraocular lens: Secondary | ICD-10-CM | POA: Diagnosis not present

## 2020-12-19 DIAGNOSIS — Z79899 Other long term (current) drug therapy: Secondary | ICD-10-CM | POA: Diagnosis not present

## 2020-12-23 DIAGNOSIS — M7741 Metatarsalgia, right foot: Secondary | ICD-10-CM | POA: Insufficient documentation

## 2020-12-23 DIAGNOSIS — M79671 Pain in right foot: Secondary | ICD-10-CM | POA: Diagnosis not present

## 2020-12-24 DIAGNOSIS — E118 Type 2 diabetes mellitus with unspecified complications: Secondary | ICD-10-CM | POA: Diagnosis not present

## 2020-12-24 DIAGNOSIS — Z9181 History of falling: Secondary | ICD-10-CM | POA: Diagnosis not present

## 2020-12-24 DIAGNOSIS — M797 Fibromyalgia: Secondary | ICD-10-CM | POA: Diagnosis not present

## 2020-12-24 DIAGNOSIS — E669 Obesity, unspecified: Secondary | ICD-10-CM | POA: Diagnosis not present

## 2020-12-24 DIAGNOSIS — M545 Low back pain, unspecified: Secondary | ICD-10-CM | POA: Diagnosis not present

## 2020-12-24 DIAGNOSIS — Z79899 Other long term (current) drug therapy: Secondary | ICD-10-CM | POA: Diagnosis not present

## 2020-12-24 DIAGNOSIS — M7061 Trochanteric bursitis, right hip: Secondary | ICD-10-CM | POA: Diagnosis not present

## 2020-12-26 DIAGNOSIS — E119 Type 2 diabetes mellitus without complications: Secondary | ICD-10-CM | POA: Diagnosis not present

## 2020-12-26 DIAGNOSIS — M810 Age-related osteoporosis without current pathological fracture: Secondary | ICD-10-CM | POA: Diagnosis not present

## 2020-12-26 DIAGNOSIS — D649 Anemia, unspecified: Secondary | ICD-10-CM | POA: Diagnosis not present

## 2021-01-02 DIAGNOSIS — D649 Anemia, unspecified: Secondary | ICD-10-CM | POA: Diagnosis not present

## 2021-01-02 DIAGNOSIS — I10 Essential (primary) hypertension: Secondary | ICD-10-CM | POA: Diagnosis not present

## 2021-01-02 DIAGNOSIS — H6120 Impacted cerumen, unspecified ear: Secondary | ICD-10-CM | POA: Diagnosis not present

## 2021-01-02 DIAGNOSIS — K219 Gastro-esophageal reflux disease without esophagitis: Secondary | ICD-10-CM | POA: Diagnosis not present

## 2021-01-02 DIAGNOSIS — M4800 Spinal stenosis, site unspecified: Secondary | ICD-10-CM | POA: Diagnosis not present

## 2021-01-02 DIAGNOSIS — E119 Type 2 diabetes mellitus without complications: Secondary | ICD-10-CM | POA: Diagnosis not present

## 2021-01-02 DIAGNOSIS — M4316 Spondylolisthesis, lumbar region: Secondary | ICD-10-CM | POA: Diagnosis not present

## 2021-01-02 DIAGNOSIS — E785 Hyperlipidemia, unspecified: Secondary | ICD-10-CM | POA: Diagnosis not present

## 2021-01-20 DIAGNOSIS — K2100 Gastro-esophageal reflux disease with esophagitis, without bleeding: Secondary | ICD-10-CM | POA: Diagnosis not present

## 2021-01-23 ENCOUNTER — Other Ambulatory Visit: Payer: Self-pay

## 2021-01-23 ENCOUNTER — Inpatient Hospital Stay: Payer: Medicare Other

## 2021-01-23 ENCOUNTER — Inpatient Hospital Stay: Payer: Medicare Other | Attending: Hematology | Admitting: Hematology

## 2021-01-23 DIAGNOSIS — Z171 Estrogen receptor negative status [ER-]: Secondary | ICD-10-CM

## 2021-01-23 DIAGNOSIS — Z1501 Genetic susceptibility to malignant neoplasm of breast: Secondary | ICD-10-CM

## 2021-02-05 NOTE — Progress Notes (Signed)
Chart reviewed, agree above plan ?

## 2021-02-07 DIAGNOSIS — Z23 Encounter for immunization: Secondary | ICD-10-CM | POA: Diagnosis not present

## 2021-03-04 DIAGNOSIS — M4800 Spinal stenosis, site unspecified: Secondary | ICD-10-CM | POA: Diagnosis not present

## 2021-03-04 DIAGNOSIS — Z79899 Other long term (current) drug therapy: Secondary | ICD-10-CM | POA: Diagnosis not present

## 2021-03-04 DIAGNOSIS — D472 Monoclonal gammopathy: Secondary | ICD-10-CM | POA: Diagnosis not present

## 2021-03-04 DIAGNOSIS — M8580 Other specified disorders of bone density and structure, unspecified site: Secondary | ICD-10-CM | POA: Diagnosis not present

## 2021-03-04 DIAGNOSIS — M549 Dorsalgia, unspecified: Secondary | ICD-10-CM | POA: Diagnosis not present

## 2021-03-04 DIAGNOSIS — M0579 Rheumatoid arthritis with rheumatoid factor of multiple sites without organ or systems involvement: Secondary | ICD-10-CM | POA: Diagnosis not present

## 2021-03-04 DIAGNOSIS — M5137 Other intervertebral disc degeneration, lumbosacral region: Secondary | ICD-10-CM | POA: Diagnosis not present

## 2021-03-04 DIAGNOSIS — M1990 Unspecified osteoarthritis, unspecified site: Secondary | ICD-10-CM | POA: Diagnosis not present

## 2021-03-04 DIAGNOSIS — R700 Elevated erythrocyte sedimentation rate: Secondary | ICD-10-CM | POA: Diagnosis not present

## 2021-03-04 DIAGNOSIS — M25532 Pain in left wrist: Secondary | ICD-10-CM | POA: Diagnosis not present

## 2021-03-04 DIAGNOSIS — M5030 Other cervical disc degeneration, unspecified cervical region: Secondary | ICD-10-CM | POA: Diagnosis not present

## 2021-03-11 DIAGNOSIS — Z23 Encounter for immunization: Secondary | ICD-10-CM | POA: Diagnosis not present

## 2021-03-24 DIAGNOSIS — M0579 Rheumatoid arthritis with rheumatoid factor of multiple sites without organ or systems involvement: Secondary | ICD-10-CM | POA: Diagnosis not present

## 2021-03-24 DIAGNOSIS — I10 Essential (primary) hypertension: Secondary | ICD-10-CM | POA: Diagnosis not present

## 2021-03-24 DIAGNOSIS — E119 Type 2 diabetes mellitus without complications: Secondary | ICD-10-CM | POA: Diagnosis not present

## 2021-03-24 DIAGNOSIS — E785 Hyperlipidemia, unspecified: Secondary | ICD-10-CM | POA: Diagnosis not present

## 2021-04-13 DIAGNOSIS — E118 Type 2 diabetes mellitus with unspecified complications: Secondary | ICD-10-CM | POA: Diagnosis not present

## 2021-04-13 DIAGNOSIS — M7061 Trochanteric bursitis, right hip: Secondary | ICD-10-CM | POA: Diagnosis not present

## 2021-04-13 DIAGNOSIS — M797 Fibromyalgia: Secondary | ICD-10-CM | POA: Diagnosis not present

## 2021-04-13 DIAGNOSIS — E669 Obesity, unspecified: Secondary | ICD-10-CM | POA: Diagnosis not present

## 2021-04-13 DIAGNOSIS — Z9181 History of falling: Secondary | ICD-10-CM | POA: Diagnosis not present

## 2021-04-13 DIAGNOSIS — Z79899 Other long term (current) drug therapy: Secondary | ICD-10-CM | POA: Diagnosis not present

## 2021-04-13 DIAGNOSIS — M545 Low back pain, unspecified: Secondary | ICD-10-CM | POA: Diagnosis not present

## 2021-05-12 DIAGNOSIS — Z9181 History of falling: Secondary | ICD-10-CM | POA: Diagnosis not present

## 2021-05-12 DIAGNOSIS — M545 Low back pain, unspecified: Secondary | ICD-10-CM | POA: Diagnosis not present

## 2021-05-12 DIAGNOSIS — M797 Fibromyalgia: Secondary | ICD-10-CM | POA: Diagnosis not present

## 2021-05-12 DIAGNOSIS — M109 Gout, unspecified: Secondary | ICD-10-CM | POA: Diagnosis not present

## 2021-05-12 DIAGNOSIS — E118 Type 2 diabetes mellitus with unspecified complications: Secondary | ICD-10-CM | POA: Diagnosis not present

## 2021-05-12 DIAGNOSIS — Z79899 Other long term (current) drug therapy: Secondary | ICD-10-CM | POA: Diagnosis not present

## 2021-05-12 DIAGNOSIS — E669 Obesity, unspecified: Secondary | ICD-10-CM | POA: Diagnosis not present

## 2021-05-12 DIAGNOSIS — M7061 Trochanteric bursitis, right hip: Secondary | ICD-10-CM | POA: Diagnosis not present

## 2021-05-21 DIAGNOSIS — H6120 Impacted cerumen, unspecified ear: Secondary | ICD-10-CM | POA: Diagnosis not present

## 2021-05-22 DIAGNOSIS — M79671 Pain in right foot: Secondary | ICD-10-CM | POA: Diagnosis not present

## 2021-05-22 DIAGNOSIS — M792 Neuralgia and neuritis, unspecified: Secondary | ICD-10-CM | POA: Diagnosis not present

## 2021-06-27 ENCOUNTER — Emergency Department (HOSPITAL_COMMUNITY): Payer: Medicare Other

## 2021-06-27 ENCOUNTER — Emergency Department (HOSPITAL_COMMUNITY)
Admission: EM | Admit: 2021-06-27 | Discharge: 2021-06-28 | Disposition: A | Discharge status: Home / Self Care | Payer: Medicare Other | Attending: Emergency Medicine | Admitting: Emergency Medicine

## 2021-06-27 ENCOUNTER — Encounter (HOSPITAL_COMMUNITY): Payer: Self-pay

## 2021-06-27 ENCOUNTER — Other Ambulatory Visit: Payer: Self-pay

## 2021-06-27 DIAGNOSIS — Z79899 Other long term (current) drug therapy: Secondary | ICD-10-CM | POA: Insufficient documentation

## 2021-06-27 DIAGNOSIS — Z7952 Long term (current) use of systemic steroids: Secondary | ICD-10-CM | POA: Diagnosis not present

## 2021-06-27 DIAGNOSIS — M549 Dorsalgia, unspecified: Secondary | ICD-10-CM | POA: Diagnosis present

## 2021-06-27 DIAGNOSIS — M48062 Spinal stenosis, lumbar region with neurogenic claudication: Secondary | ICD-10-CM | POA: Diagnosis not present

## 2021-06-27 DIAGNOSIS — R109 Unspecified abdominal pain: Secondary | ICD-10-CM | POA: Diagnosis not present

## 2021-06-27 LAB — CBC WITH DIFFERENTIAL/PLATELET
Abs Immature Granulocytes: 0.05 10*3/uL (ref 0.00–0.07)
Basophils Absolute: 0 10*3/uL (ref 0.0–0.1)
Basophils Relative: 0 %
Eosinophils Absolute: 0 10*3/uL (ref 0.0–0.5)
Eosinophils Relative: 0 %
HCT: 40.1 % (ref 36.0–46.0)
Hemoglobin: 12.5 g/dL (ref 12.0–15.0)
Immature Granulocytes: 1 %
Lymphocytes Relative: 12 %
Lymphs Abs: 0.8 10*3/uL (ref 0.7–4.0)
MCH: 27.2 pg (ref 26.0–34.0)
MCHC: 31.2 g/dL (ref 30.0–36.0)
MCV: 87.2 fL (ref 80.0–100.0)
Monocytes Absolute: 0.6 10*3/uL (ref 0.1–1.0)
Monocytes Relative: 10 %
Neutro Abs: 5 10*3/uL (ref 1.7–7.7)
Neutrophils Relative %: 77 %
Platelets: 226 10*3/uL (ref 150–400)
RBC: 4.6 MIL/uL (ref 3.87–5.11)
RDW: 14.5 % (ref 11.5–15.5)
WBC: 6.5 10*3/uL (ref 4.0–10.5)
nRBC: 0 % (ref 0.0–0.2)

## 2021-06-27 LAB — URINALYSIS, ROUTINE W REFLEX MICROSCOPIC
Bilirubin Urine: NEGATIVE
Glucose, UA: NEGATIVE mg/dL
Ketones, ur: NEGATIVE mg/dL
Nitrite: NEGATIVE
Protein, ur: NEGATIVE mg/dL
Specific Gravity, Urine: 1.01 (ref 1.005–1.030)
pH: 6 (ref 5.0–8.0)

## 2021-06-27 LAB — COMPREHENSIVE METABOLIC PANEL
ALT: 17 U/L (ref 0–44)
AST: 21 U/L (ref 15–41)
Albumin: 3.5 g/dL (ref 3.5–5.0)
Alkaline Phosphatase: 57 U/L (ref 38–126)
Anion gap: 8 (ref 5–15)
BUN: 26 mg/dL — ABNORMAL HIGH (ref 8–23)
CO2: 30 mmol/L (ref 22–32)
Calcium: 9.2 mg/dL (ref 8.9–10.3)
Chloride: 101 mmol/L (ref 98–111)
Creatinine, Ser: 1.06 mg/dL — ABNORMAL HIGH (ref 0.44–1.00)
GFR, Estimated: 51 mL/min — ABNORMAL LOW (ref >60)
Glucose, Bld: 148 mg/dL — ABNORMAL HIGH (ref 70–99)
Potassium: 3.6 mmol/L (ref 3.5–5.1)
Sodium: 139 mmol/L (ref 135–145)
Total Bilirubin: 0.7 mg/dL (ref 0.3–1.2)
Total Protein: 7 g/dL (ref 6.5–8.1)

## 2021-06-27 MED ORDER — LORAZEPAM 2 MG/ML IJ SOLN: 1.0000 mg | Freq: Once | INTRAMUSCULAR | Status: DC | PRN

## 2021-06-27 MED ORDER — FENTANYL 25 MCG/HR TD PT72
1.0000 | MEDICATED_PATCH | TRANSDERMAL | Status: DC
  Administered 2021-06-28: 1 via TRANSDERMAL
  Filled 2021-06-27: qty 1

## 2021-06-27 MED ORDER — ONDANSETRON HCL 4 MG/2ML IJ SOLN
4.0000 mg | Freq: Once | INTRAMUSCULAR | Status: AC
  Administered 2021-06-27: 4 mg via INTRAVENOUS
  Filled 2021-06-27: qty 2

## 2021-06-27 MED ORDER — MORPHINE SULFATE (PF) 4 MG/ML IV SOLN
4.0000 mg | Freq: Once | INTRAVENOUS | Status: AC
  Administered 2021-06-27: 4 mg via INTRAVENOUS
  Filled 2021-06-27: qty 1

## 2021-06-27 MED ORDER — KETOROLAC TROMETHAMINE 15 MG/ML IJ SOLN
15.0000 mg | Freq: Once | INTRAMUSCULAR | Status: AC
  Administered 2021-06-27: 15 mg via INTRAMUSCULAR
  Filled 2021-06-27: qty 1

## 2021-06-27 NOTE — ED Notes (Signed)
Ambulated pt in 19-25 hallway with walker. Pt did not need any assistance. Gait is steady. Pt endorses back and hip pain but denies dizziness or weakness.

## 2021-06-27 NOTE — ED Provider Notes (Signed)
[facility] DEPT Provider Note   CSN: [id number] Arrival date & time: 06/27/21  1446     History  Chief Complaint  Patient presents with   Back Pain    Julie [name] is a 88 y.o. female.  Pt is a 88 yo bf with a hx of intermittent back pain.  She has had back pain for 2 weeks.  Pt said it is on both sides of her back and goes down her legs.  Pt did see her orthopedist and was given prednisone and toradol.  Pain management also increased her oxy ir from bid to tid. Pt is still having severe pain.  Her daughter said she is having trouble moving her legs and is having to rely on her walker more.      Home Medications Prior to Admission medications   Medication Sig Start Date End Date Taking? Authorizing Provider  amLODipine (NORVASC) 10 MG tablet Take 10 mg by mouth daily. 09/27/19   [provider]  cholecalciferol (VITAMIN D3) 25 MCG (1000 UNIT) tablet Take 1,000 Units by mouth daily.    [provider]  diclofenac Sodium (VOLTAREN) 1 % GEL SMARTSIG:3 Inch(es) Topical 3 Times Daily PRN 11/08/19   [provider]  folic acid (FOLVITE) 1 MG tablet Take 2 mg by mouth every morning.    [provider]  hydroxychloroquine (PLAQUENIL) 200 MG tablet Take 200 mg by mouth daily. 12/27/18   [provider]  LIDOCAINE PAIN RELIEF 4 % PTCH Apply 1 patch topically 2 (two) times daily. 11/21/19   [provider]  lovastatin (MEVACOR) 40 MG tablet 1 (ONE) TABLET TAKE IN THE EVENING AFTER DINNER Patient taking differently: Take 40 mg by mouth every evening. 08/12/18   Despina Hick, MD  metoprolol succinate (TOPROL-XL) 50 MG 24 hr tablet Take 50 mg by mouth every evening.  08/29/15   [provider]  omeprazole (PRILOSEC) 40 MG capsule Take 40 mg by mouth daily. 11/07/19   [provider]  oxyCODONE (OXY IR/ROXICODONE) 5 MG immediate release tablet Take 5 mg by mouth 3 (three) times daily as needed.  12/01/19   [provider]  polyethylene glycol (MIRALAX / GLYCOLAX) 17 g packet Take 17 g by mouth daily.    [provider]  predniSONE (DELTASONE) 5 MG tablet Take 7.5 mg by mouth daily. 11/06/19   [provider]  pregabalin (LYRICA) 75 MG capsule Take 75-150 mg by mouth See admin instructions. 75 Mg in the Am and 150 MG at bedtime    [provider]  valsartan-hydrochlorothiazide (DIOVAN-HCT) 320-12.5 MG tablet Take 0.5 tablets by mouth daily. 08/05/19   [provider]      Allergies    Codeine, Other, and Aspirin    Review of Systems   Review of Systems  Musculoskeletal:  Positive for back pain.  All other systems reviewed and are negative.  Physical Exam Updated Vital Signs BP 140/71 (BP Location: Right Arm)    Pulse 62    Temp 99 F (37.2 C) (Oral)    Resp 16    Ht 5' 3.5" (1.613 m)    Wt 93.9 kg    SpO2 96%    BMI 36.09 kg/m  Physical Exam Vitals and nursing note reviewed.  Constitutional:      Appearance: Normal appearance.  HENT:     Head: Normocephalic and atraumatic.     Right Ear: External ear normal.     Left Ear: External  ear normal.     Nose: Nose normal.     Mouth/Throat:     Mouth: Mucous membranes are moist.     Pharynx: Oropharynx is clear.  Eyes:     Extraocular Movements: Extraocular movements intact.     Conjunctiva/sclera: Conjunctivae normal.     Pupils: Pupils are equal, round, and reactive to light.  Cardiovascular:     Rate and Rhythm: Normal rate and regular rhythm.     Pulses: Normal pulses.     Heart sounds: Normal heart sounds.  Pulmonary:     Effort: Pulmonary effort is normal.     Breath sounds: Normal breath sounds.  Abdominal:     General: Abdomen is flat. Bowel sounds are normal.     Palpations: Abdomen is soft.  Musculoskeletal:     Cervical back: Normal range of motion and neck supple.     Lumbar back: Decreased range of motion.  Skin:    General: Skin is warm.     Capillary Refill:  Capillary refill takes less than 2 seconds.  Neurological:     General: No focal deficit present.     Mental Status: She is alert and oriented to person, place, and time.  Psychiatric:        Mood and Affect: Mood normal.        Behavior: Behavior normal.    ED Results / Procedures / Treatments   Labs (all labs ordered are listed, but only abnormal results are displayed) Labs Reviewed  COMPREHENSIVE METABOLIC PANEL - Abnormal; Notable for the following components:      Result Value   Glucose, Bld 148 (*)    BUN 26 (*)    Creatinine, Ser 1.06 (*)    GFR, Estimated 51 (*)    All other components within normal limits  URINALYSIS, ROUTINE W REFLEX MICROSCOPIC - Abnormal; Notable for the following components:   Color, Urine YELLOW (*)    APPearance CLEAR (*)    Hgb urine dipstick TRACE (*)    Leukocytes,Ua TRACE (*)    Bacteria, UA RARE (*)    All other components within normal limits  CBC WITH DIFFERENTIAL/PLATELET    EKG None  Radiology MR LUMBAR SPINE WO CONTRAST  Result Date: 06/27/2021 CLINICAL DATA:  Low back pain EXAM: MRI LUMBAR SPINE WITHOUT CONTRAST TECHNIQUE: Multiplanar, multisequence MR imaging of the lumbar spine was performed. No intravenous contrast was administered. COMPARISON:  06/19/2016 MRI lumbar spine, correlation is also made with 06/27/2021 CT abdomen pelvis and 09/07/2019 CT lumbar spine FINDINGS: Segmentation:  Standard. Alignment: S shaped curvature of the thoracolumbar spine. Grade 1 anterolisthesis of L4 on L5, which appears similar to the prior CT. Vertebrae: No acute fracture or suspicious osseous lesion. Endplate degenerative changes, most prominently at L5-S1. Conus medullaris and cauda equina: Conus extends to the L2 level. Conus and cauda equina appear normal. Paraspinal and other soft tissues: Please see same-day CT abdomen pelvis. Disc levels: T12-L1: No significant disc bulge. Mild facet arthropathy. No spinal canal stenosis. Mild bilateral neural  foraminal narrowing, unchanged. L1-L2: Mild disc bulge. Mild facet arthropathy. No spinal canal stenosis. Mild bilateral neural foraminal narrowing, unchanged. L2-L3: disc height loss and mild disc bulge. Moderate facet arthropathy. Ligamentum flavum hypertrophy. Previously noted disc extrusion is decreased in size. Unchanged mild-to-moderate spinal canal stenosis. Moderate left and mild right neural foraminal narrowing. L3-L4: Disc height loss and mild disc bulge. Moderate facet arthropathy with ligamentum flavum hypertrophy. Unchanged moderate spinal canal stenosis. Moderate to severe right  and moderate left neural foraminal narrowing, unchanged. L4-L5: Anterolisthesis with disc unroofing and moderate disc bulge. Moderate facet arthropathy. Mild-to-moderate spinal canal stenosis, which has progressed slightly from the prior exam. Mild left and severe right neural foraminal narrowing, unchanged. L5-S1: Disc height loss and moderate disc bulge, somewhat eccentric to the right. Severe facet arthropathy no spinal canal stenosis. Severe bilateral neural foraminal narrowing, which has progressed from prior exam. IMPRESSION: 1. L5-S1 severe bilateral neural foraminal narrowing, which has progressed from the prior exam. 2. L4-L5 mild-to-moderate spinal canal stenosis, which has progressed slightly from the prior exam, with unchanged severe right and mild left neural foraminal narrowing. 3. L3-L4 unchanged moderate spinal canal stenosis, with moderate to severe right and moderate left neural foraminal narrowing. 4. L2-L3 unchanged mild-to-moderate spinal canal stenosis, with moderate left and mild right neural foraminal narrowing. 5. T12-L1 and L1-L2 unchanged mild bilateral neural foraminal narrowing. Electronically Signed   By: Merilyn Baba M.D.   On: 06/27/2021 22:39   CT Renal Stone Study  Result Date: 06/27/2021 CLINICAL DATA:  Flank pain, kidney stone suspected. Low back pain with lower extremity radiation,  nausea EXAM: CT ABDOMEN AND PELVIS WITHOUT CONTRAST TECHNIQUE: Multidetector CT imaging of the abdomen and pelvis was performed following the standard protocol without IV contrast. RADIATION DOSE REDUCTION: This exam was performed according to the departmental dose-optimization program which includes automated exposure control, adjustment of the mA and/or kV according to patient size and/or use of iterative reconstruction technique. COMPARISON:  12/13/2009 FINDINGS: Lower chest: The visualized lung bases are clear. Moderate coronary artery calcification. Global cardiac size within normal limits. Hepatobiliary: No focal liver abnormality is seen. Status post cholecystectomy. No biliary dilatation. Pancreas: Unremarkable Spleen: Unremarkable Adrenals/Urinary Tract: The kidneys are normal in size and position. Multiple parapelvic cysts are seen bilaterally, better appreciated on prior examination. There is no hydronephrosis. No intrarenal or ureteral calculi. No perinephric fluid collections. The bladder is unremarkable. Stomach/Bowel: Surgical changes of probable hiatal hernia repair are identified. The stomach, large bowel, and small bowel are otherwise unremarkable save for mild scattered diverticulosis of the descending colon. No evidence of obstruction or focal inflammation. Appendix normal. No free intraperitoneal gas or fluid. Vascular/Lymphatic: Aortic atherosclerosis. No enlarged abdominal or pelvic lymph nodes. Reproductive: Status post hysterectomy. No adnexal masses. Other: No abdominal wall hernia or abnormality. No abdominopelvic ascites. Musculoskeletal: Degenerative changes are seen within the lumbar spine. No acute bone abnormality. No lytic or blastic bone lesion. IMPRESSION: No acute intra-abdominal pathology identified. No definite radiographic explanation for the patient's reported symptoms. Normal noncontrast examination of the kidneys and renal collecting systems. Mild distal colonic  diverticulosis without superimposed acute inflammatory change. Advanced degenerative changes within the lumbar spine. This would be better assessed with MRI examination, if indicated. Electronically Signed   By: Fidela Salisbury M.D.   On: 06/27/2021 20:15    Procedures Procedures    Medications Ordered in ED Medications  LORazepam (ATIVAN) injection 1 mg (0 mg Intravenous Hold 06/27/21 2214)  fentaNYL (DURAGESIC) 25 MCG/HR 1 patch (has no administration in time range)  ketorolac (TORADOL) 15 MG/ML injection 15 mg (15 mg Intramuscular Given 06/27/21 1548)  morphine (PF) 4 MG/ML injection 4 mg (4 mg Intravenous Given 06/27/21 2031)  ondansetron (ZOFRAN) injection 4 mg (4 mg Intravenous Given 06/27/21 2033)  morphine (PF) 4 MG/ML injection 4 mg (4 mg Intravenous Given 06/27/21 2243)    ED Course/ Medical Decision Making/ A&P  Medical Decision Making Amount and/or Complexity of Data Reviewed Labs: ordered. Radiology: ordered.  Risk Prescription drug management.   Pt's labs and urine were ordered and reviewed.  They were nl.    Pt's CT abd/pelvis did not show anything acute.  MRI showed severe spinal stenosis.  She has some worsening at L5-S1.  She has some neurogenic claudication.    Pt said the orthopedist has already told her that she was not a surgical candidate.  Pt does see a rheumatologist.  They have talked about starting Humira as pt has severe RA.  Pt and daughter have been resistant, but they are willing to start it as options for treatment are limited for this patient.  They will call Dr. Dossie Der (Rheumatology) for an appt.  We will try the fentanyl patch to see if pt can get a more consistent pain control level.  I will apply the patch prior to d/c and if it helps, she can ask her doctor for a refill.  Pt is able to ambulate with a walker.  Pt knows to return if worse.   Plan d/w pt and her daughter at bedside.        Final Clinical Impression(s)  / ED Diagnoses Final diagnoses:  Spinal stenosis of lumbar region with neurogenic claudication    Rx / DC Orders ED Discharge Orders     None         Isla Pence, MD 06/27/21 2345

## 2021-06-27 NOTE — ED Triage Notes (Signed)
Patient c/o bilateral lower back pain that radiates down both legs x 1 week.  Patient also c/o nausea after taking Prednisone that was prescribed for her back pain. Patient also received a Toradol injection by pain management. Patient's daughter reports that this last for a day, but back pain worsening since.

## 2021-06-27 NOTE — ED Notes (Signed)
Pt transported to MRI 

## 2021-06-27 NOTE — ED Provider Triage Note (Addendum)
Emergency Medicine Provider Triage Evaluation Note  Julie [name] , a 88 y.o. female  was evaluated in triage.  Pt complains of bilateral lower back pain for the last 2 weeks.  This is a known problem for the patient, she has been seen by neurologist, orthopedist and physical therapist.  Patient denies any red flag symptoms.  Patient denies any trauma or preceding event to the onset of this back pain.  She reports that the back pain is similar nature to the past episodes of back pain.  Patient also reports being followed by pain management.  Patient said to have MRI conducted next week.  Patient reports that she had an Toradol shot given to her yesterday, she reported that this relieved much of the pain.  She states the effects of worn off and she is requesting another one.  I will provide her one here in triage.  Review of Systems  Positive: Low back pain Negative: Chest pain, shortness of breath, groin numbness, lower extremity weakness, bowel or bladder dysfunction, nausea, vomiting  Physical Exam  BP 128/70 (BP Location: Right Arm)    Pulse 62    Temp 99 F (37.2 C) (Oral)    Resp 16    Ht 5' 3.5" (1.613 m)    Wt 93.9 kg    SpO2 94%    BMI 36.09 kg/m  Gen:   Awake, no distress   Resp:  Normal effort  MSK:   Moves extremities without difficulty  Other:  Patient has bilateral tenderness to palpation of the low back  Medical Decision Making  Medically screening exam initiated at 3:42 PM.  Appropriate orders placed.  Julie [name] was informed that the remainder of the evaluation will be completed by another provider, this initial triage assessment does not replace that evaluation, and the importance of remaining in the ED until their evaluation is complete.       Azucena Cecil, PA-C 06/27/21 1545

## 2021-06-28 DIAGNOSIS — M48062 Spinal stenosis, lumbar region with neurogenic claudication: Secondary | ICD-10-CM | POA: Diagnosis not present

## 2021-06-30 ENCOUNTER — Telehealth: Payer: Self-pay | Admitting: Neurology

## 2021-06-30 NOTE — Telephone Encounter (Signed)
Pt's daughter, [name] (on [name]) pt went to ER, having pain in lower back, could hardly walk. Was advised to follow up with Rheumatology and PCP. Rheumatology recommended pt see neurologist because having pain in lower back, buttock and down both legs  Daughter ask to schedule appt with Dr. Krista Blue. Have scheduled appt on 07/10/21 at 3 pm.

## 2021-07-10 ENCOUNTER — Ambulatory Visit (INDEPENDENT_AMBULATORY_CARE_PROVIDER_SITE_OTHER): Payer: Medicare Other | Admitting: Neurology

## 2021-07-10 ENCOUNTER — Encounter: Payer: Self-pay | Admitting: Neurology

## 2021-07-10 ENCOUNTER — Other Ambulatory Visit: Payer: Self-pay

## 2021-07-10 VITALS — BP 144/75 | HR 57

## 2021-07-10 DIAGNOSIS — R269 Unspecified abnormalities of gait and mobility: Secondary | ICD-10-CM

## 2021-07-10 DIAGNOSIS — G8929 Other chronic pain: Secondary | ICD-10-CM

## 2021-07-10 DIAGNOSIS — M5442 Lumbago with sciatica, left side: Secondary | ICD-10-CM | POA: Diagnosis not present

## 2021-07-10 DIAGNOSIS — M5441 Lumbago with sciatica, right side: Secondary | ICD-10-CM

## 2021-07-10 DIAGNOSIS — M25551 Pain in right hip: Secondary | ICD-10-CM | POA: Diagnosis not present

## 2021-07-10 DIAGNOSIS — M25552 Pain in left hip: Secondary | ICD-10-CM

## 2021-07-10 MED ORDER — DULOXETINE HCL 30 MG PO CPEP: 30.0000 mg | ORAL_CAPSULE | Freq: Every day | ORAL | 11 refills | Status: DC

## 2021-07-10 NOTE — Progress Notes (Signed)
PATIENT: Julie [name] DOB: [birth year]  Chief Complaint  Patient presents with   New Patient (Initial Visit)    Rm 15, with daughter  Rheumatology recommended pt see neurologist because having lower back pain and down her legs, states pain started 3 weeks ago, painful to walk      Julie [name] is a 88 year old female, seen in request by her primary care physician Dr. [name], [place], for evaluation of bilateral hands and feet paresthesia, initial evaluation was on January 09, 2019.  I have reviewed and summarized the referring note from the referring physician.   Past medical history of hypertension, hyperlipidemia, left breast cancer, status post left lobectomy, chemotherapy, in 2017, during that time, she developed bilateral fingertips and feet paresthesia, also had a history of chronic low back pain, carries a diagnosis of lumbar stenosis, has gradual onset gait abnormality  Over the past 3 years, she continue to experience worsening bilateral fingertips and feet paresthesia, numbness tingling stay at the bottom of her feet, and toes, she also complains of worsening low back pain, radiating pain to bilateral lower extremities, last week while driving, she described difficulty moving her right leg off the paddle, she also complains of bilateral hands tremor, difficulty holding on small object  Hospital admission in July 2020 for generalized whole body achy pain, weakness, there was also described episode of staring spells at emergency room,  Echocardiogram December 14, 2018: Ejection fraction 60 to 65%, cavity size was normal, mild increased left ventricular wall thickness  MRI of the brain without contrast on March 13, 2018: No acute abnormality, moderate supratentorium small vessel disease  MRI of lumbar in January 2018, multilevel degenerative disc disease, mild to moderate canal stenosis at L2-3, L3-4, mild at L4-5, with variable degree of neural foraminal narrowing,  severe neural foraminal narrowing at right L3-4, L4-5,  MRI cervical spine September 2020: Multilevel degenerative changes, evidence of anterior cervical discectomy and fusion at C 4, 5, 6  Laboratory evaluation in 2020 showed normal TSH, CBC, CMP showed mild elevated glucose 115, normal CPK, negative HIV, RPR, A1c of 6.0, mild elevated M spike 0.8 DL on protein electrophoresis, normal B12, C-reactive protein,  EMG nerve conduction study in August 2020 showed no large fiber peripheral neuropathy  UPDATE December 14 2019: She was seen in 2020 for evaluation of intermittent bilateral upper and lower extremity paresthesia, chronic low back pain, gait abnormality, she was referred for physical therapy, which has helped her, but never carried through the water aerobic as suggested  She spent most of the time watching TV, different game show, not physically active, since 2020, she developed gradual onset more noticeable intermittent body shaking, also complains of generalized weakness, weak handgrip, using both hands to hold a cup,  She is currently under pain management, taking Lyrica, also oxycodone, she had recent flareup of her arthritis pain, was treated with low dose of steroid  She now has caregiver coming every other day, spent a few hours with her, she enjoying the trip to different shops,  She also has mild memory loss, word finding difficulties, denies family history of dementia, is on polypharmacy treatment, including oxycodone 5 mg 3 times a day, Lyrica 75 mg  UPDATE Jun 17 2020: She is accompanied by her daughter at today's clinical visit, she complains of constant multiple joints pain, was seen by rheumatologist, taking prednisone 7.5 mg daily, also Lyrica 75 mg 3 times a day, sleep well, has good appetite, also  take frequent naps during the day  Today her main concern is slow worsening memory loss, left stove on, no longer feel comfortable using sharp knives, has caregiver coming with her  few times each week, family do not feel safe to leave her alone, occasionally agitated, increased urine urinary tract infection, improve afterwards.  NoW daughter is managing her medications now  UPDATE Jul 10 2021: Here today with Julie [name], her daughter.  She continue has mild memory loss, slow worsening, she has caregiver few times a week, her daughter Julie [name] has to take over managing her medications now, she complains of worsening low back pain, spend most of the time watching TV  Personally reviewed MRI of the brain in February 2022 showed no acute abnormality, stable atrophy, moderate small vessel disease.  When she was participating in physical therapy, which has helped her balance, and strength, but she is no longer keep up with her routine exercise anymore,  She is also under pain management for chronic knee pain, low back pain, taking oxycodone 5 mg every 4 hours as needed,  Personally reviewed MRI of lumbar June 27, 2021, L1-S1 severe bilateral neuroforaminal narrowing, mild to moderate spinal stenosis L4-5, moderate spinal stenosis L3-4, moderate to severe right and moderate left neuroforaminal narrowing,  REVIEW OF SYSTEMS: Full 14 system review of systems performed and notable only for as above  See HPI  ALLERGIES: Allergies  Allergen Reactions   Codeine Shortness Of Breath   Other     Walnuts-anaphylaxis (patient denies allergy states that she just does not eat WALNUTS)   Aspirin Other (See Comments)    Duodenal ulcer- cannot take aspirin when active.  HYPERSENSITIVE  TO  REGULAR DOSE.      HOME MEDICATIONS: Current Outpatient Medications  Medication Sig Dispense Refill   amLODipine (NORVASC) 10 MG tablet Take 10 mg by mouth daily.     cholecalciferol (VITAMIN D3) 25 MCG (1000 UNIT) tablet Take 1,000 Units by mouth daily.     diclofenac Sodium (VOLTAREN) 1 % GEL SMARTSIG:3 Inch(es) Topical 3 Times Daily PRN     folic acid (FOLVITE) 1 MG tablet Take 2 mg by mouth  every morning.     hydroxychloroquine (PLAQUENIL) 200 MG tablet Take 200 mg by mouth daily.     LIDOCAINE PAIN RELIEF 4 % PTCH Apply 1 patch topically 2 (two) times daily.     lovastatin (MEVACOR) 40 MG tablet 1 (ONE) TABLET TAKE IN THE EVENING AFTER DINNER (Patient taking differently: Take 40 mg by mouth every evening.) 90 tablet 1   metoprolol succinate (TOPROL-XL) 50 MG 24 hr tablet Take 50 mg by mouth every evening.   2   omeprazole (PRILOSEC) 40 MG capsule Take 40 mg by mouth daily.     oxyCODONE (OXY IR/ROXICODONE) 5 MG immediate release tablet Take 5 mg by mouth every 4 (four) hours as needed.     polyethylene glycol (MIRALAX / GLYCOLAX) 17 g packet Take 17 g by mouth daily.     predniSONE (DELTASONE) 5 MG tablet Take 7.5 mg by mouth daily.     pregabalin (LYRICA) 75 MG capsule Take 75-150 mg by mouth See admin instructions. 75 Mg in the Am and 150 MG at bedtime     valsartan-hydrochlorothiazide (DIOVAN-HCT) 320-12.5 MG tablet Take 0.5 tablets by mouth daily.     No current facility-administered medications for this visit.    PAST MEDICAL HISTORY: Past Medical History:  Diagnosis Date   Anemia    Anxiety  takes Klonopin at bedtime   Arthritis    takes Methotrexate weekly and Prednisone   Blood transfusion    no abnormal reaction noted   BRCA2 gene mutation positive 09/16/2020   Cancer ([place])    breast    Cataracts, bilateral    immature   Complication of anesthesia    wakes up during surgery   Depression    Diabetes mellitus without complication ([place])    Dyspnea    with exertion since chemo    Family history of colon cancer 08/30/2020   Family history of kidney cancer 08/30/2020   Family history of prostate cancer 08/30/2020   Fibromyalgia    takes Lyrica daily   GERD (gastroesophageal reflux disease)    takes Nexium daily   Glaucoma    Hard of hearing    Heart murmur    Histoplasmosis    History of colon polyps    History of gout    History of hiatal hernia     History of shingles    Hyperlipidemia    Hypertension    takes Amlodipine,Metoprolol,and Losartan daily   Incontinence of bowel    Interstitial cystitis    Joint pain    Low back pain    Nocturia    Peripheral neuropathy    Peripheral neuropathy    Personal history of chemotherapy    Personal history of radiation therapy    PONV (postoperative nausea and vomiting)    Ulcer 1970   duodenal   Urinary frequency    Urinary urgency    Weakness    numbness in both hands and feet    PAST SURGICAL HISTORY: Past Surgical History:  Procedure Laterality Date   ABDOMINAL HYSTERECTOMY     accupuncture  3 yrs ago   ANKLE FUSION Left 05/10/2014   Procedure: LEFT ANKLE ARTHRODESIS ;  Surgeon: Wylene Simmer, MD;  Location: [facility];  Service: Orthopedics;  Laterality: Left;   ANKLE SURGERY Left    arm surgery Left    radius   bladder tacked      BREAST LUMPECTOMY Left 10/2015   CERVICAL FUSION     CHOLECYSTECTOMY     COLONOSCOPY     CYSTOCELE REPAIR     DILATION AND CURETTAGE OF UTERUS     ESOPHAGOGASTRODUODENOSCOPY     PORT A CATH REVISION N/A 12/27/2015   Procedure: PORT A CATH REVISION;  Surgeon: Rolm Bookbinder, MD;  Location: [facility];  Service: General;  Laterality: N/A;   PORT-A-CATH REMOVAL N/A 03/25/2016   Procedure: REMOVAL PORT-A-CATH;  Surgeon: Rolm Bookbinder, MD;  Location: [facility];  Service: General;  Laterality: N/A;   PORTACATH PLACEMENT Right 12/27/2015   Procedure: INSERTION PORT-A-CATH WITH ULTRASOUND ;  Surgeon: Rolm Bookbinder, MD;  Location: [facility];  Service: General;  Laterality: Right;   RADIOACTIVE SEED GUIDED PARTIAL MASTECTOMY WITH AXILLARY SENTINEL LYMPH NODE BIOPSY Left 11/14/2015   Procedure: RADIOACTIVE SEED GUIDED LEFT BREAST LUMPECTOMY WITH AXILLARY SENTINEL LYMPH NODE BIOPSY;  Surgeon: Rolm Bookbinder, MD;  Location: [facility];  Service: General;  Laterality: Left;  RADIOACTIVE SEED GUIDED LEFT BREAST  LUMPECTOMY WITH AXILLARY SENTINEL LYMPH NODE BIOPSY   SALPINGOOPHORECTOMY Bilateral    TOTAL KNEE ARTHROPLASTY Right 02/28/2015   Procedure: RIGHT TOTAL KNEE ARTHROPLASTY;  Surgeon: Rod Can, MD;  Location: [facility];  Service: Orthopedics;  Laterality: Right;   UPPER GASTROINTESTINAL ENDOSCOPY     VAGINAL HYSTERECTOMY      FAMILY HISTORY: Family History  Problem Relation Age of Onset   Colon cancer Sister 65   Kidney cancer Brother        dx after 10   Breast cancer Mother        dx after 98   Brain cancer Other    Other Father        cerebral hemorrhage   Breast cancer Maternal Aunt        several   Prostate cancer Brother        dx after 21    SOCIAL HISTORY: Social History   Socioeconomic History   Marital status: Divorced    Spouse name: Not on file   Number of children: 5   Years of education: some college   Highest education level: Not on file  Occupational History   Occupation: Retired  Tobacco Use   Smoking status: Never   Smokeless tobacco: Never  Scientific laboratory technician Use: Never used  Substance and Sexual Activity   Alcohol use: No    Comment: IN [place] ONLY   Drug use: No   Sexual activity: Not on file  Other Topics Concern   Not on file  Social History Narrative   Lives at home with her daughter.   Right-handed.   1 cup caffeine per day.   Social Determinants of Health   Financial Resource Strain: Not on file  Food Insecurity: Not on file  Transportation Needs: Not on file  Physical Activity: Not on file  Stress: Not on file  Social Connections: Not on file  Intimate Partner Violence: Not on file     PHYSICAL EXAM BP 150/70, HR 57  PHYSICAL EXAMNIATION:  Gen: NAD, conversant, well nourised, obese, well groomed, pleasant, cooperative, laughing                   Cardiovascular: Regular rate rhythm, no peripheral edema, warm, nontender. Eyes: Conjunctivae clear without exudates or hemorrhage Pulmonary: Clear to auscultation  bilaterally   NEUROLOGICAL EXAM:  MENTAL STATUS: MMSE - Mini Mental State Exam 06/17/2020 12/14/2019  Orientation to time 5 4  Orientation to Place 5 5  Registration 3 1  Attention/ Calculation 5 3  Recall 1 3  Language- name 2 objects 2 2  Language- repeat 1 1  Language- follow 3 step command 3 2  Language- read & follow direction 1 1  Write a sentence 1 1  Copy design 0 0  Total score 27 23    CRANIAL NERVES: CN II: Visual fields are full to confrontation  Pupils are round equal and briskly reactive to light. CN III, IV, VI: extraocular movement are normal. No ptosis. . CN VII: Face is symmetric with normal eye closure and smile. CN VIII: Hearing is normal to casual conversation bilaterally CN XI: Head turning and shoulder shrug are intact  MOTOR: Good strength of all extremities  REFLEXES: Reflexes are 1+ throughout  SENSORY: Soft touch sensation intact throughout  COORDINATION: Finger-nose-finger and heel-to-shin are normal bilaterally  GAIT/STANCE: She needs push-up to get up from seated position, rely on her walker, cautious, mildly unsteady   DIAGNOSTIC DATA (LABS, IMAGING, TESTING) - I reviewed patient records, labs, notes, testing and imaging myself where available.  ASSESSMENT AND PLAN  INDA MCGLOTHEN is a 88 y.o. female    Mild cognitive impairment Chronic low back pain radiating pain to bilateral hip,  X-ray of bilateral hip  Known history of multilevel lumbar degenerative disease  Is under the pain management  Emphasized  importance of moderate exercise,  Add on Cymbalta 30 mg daily   Marcial Pacas, M.D. Ph.D.  [facility] [place], [place] [address] Phone: [phone] Fax:      [phone]

## 2021-07-10 NOTE — Patient Instructions (Signed)
[facility]    Address: [address], [place],  [address]  Phone: [phone]   

## 2021-07-14 DIAGNOSIS — M5416 Radiculopathy, lumbar region: Secondary | ICD-10-CM | POA: Insufficient documentation

## 2021-08-02 ENCOUNTER — Other Ambulatory Visit: Payer: Self-pay | Admitting: Neurology

## 2021-08-04 NOTE — Telephone Encounter (Signed)
Rx refilled for 90 day supply. 

## 2021-09-01 DIAGNOSIS — M19072 Primary osteoarthritis, left ankle and foot: Secondary | ICD-10-CM | POA: Insufficient documentation

## 2021-09-30 ENCOUNTER — Other Ambulatory Visit: Payer: Self-pay | Admitting: Family Medicine

## 2021-09-30 DIAGNOSIS — H532 Diplopia: Secondary | ICD-10-CM

## 2021-10-08 NOTE — Progress Notes (Deleted)
PATIENT: Julie [name] DOB: 22-Nov-1932  No chief complaint on file.    HISTORICAL  Julie [name] is a 88 year old female, seen in request by her primary care physician Dr. [name], [place], for evaluation of bilateral hands and feet paresthesia, initial evaluation was on January 09, 2019.  I have reviewed and summarized the referring note from the referring physician.   Past medical history of hypertension, hyperlipidemia, left breast cancer, status post left lobectomy, chemotherapy, in 2017, during that time, she developed bilateral fingertips and feet paresthesia, also had a history of chronic low back pain, carries a diagnosis of lumbar stenosis, has gradual onset gait abnormality  Over the past 3 years, she continue to experience worsening bilateral fingertips and feet paresthesia, numbness tingling stay at the bottom of her feet, and toes, she also complains of worsening low back pain, radiating pain to bilateral lower extremities, last week while driving, she described difficulty moving her right leg off the paddle, she also complains of bilateral hands tremor, difficulty holding on small object  Hospital admission in July 2020 for generalized whole body achy pain, weakness, there was also described episode of staring spells at emergency room,  Echocardiogram December 14, 2018: Ejection fraction 60 to 65%, cavity size was normal, mild increased left ventricular wall thickness  MRI of the brain without contrast on March 13, 2018: No acute abnormality, moderate supratentorium small vessel disease  MRI of lumbar in January 2018, multilevel degenerative disc disease, mild to moderate canal stenosis at L2-3, L3-4, mild at L4-5, with variable degree of neural foraminal narrowing, severe neural foraminal narrowing at right L3-4, L4-5,  MRI cervical spine September 2020: Multilevel degenerative changes, evidence of anterior cervical discectomy and fusion at C 4, 5, 6  Laboratory evaluation  in 2020 showed normal TSH, CBC, CMP showed mild elevated glucose 115, normal CPK, negative HIV, RPR, A1c of 6.0, mild elevated M spike 0.8 DL on protein electrophoresis, normal B12, C-reactive protein,  EMG nerve conduction study in August 2020 showed no large fiber peripheral neuropathy  UPDATE December 14 2019: She was seen in 2020 for evaluation of intermittent bilateral upper and lower extremity paresthesia, chronic low back pain, gait abnormality, she was referred for physical therapy, which has helped her, but never carried through the water aerobic as suggested  She spent most of the time watching TV, different game show, not physically active, since 2020, she developed gradual onset more noticeable intermittent body shaking, also complains of generalized weakness, weak handgrip, using both hands to hold a cup,  She is currently under pain management, taking Lyrica, also oxycodone, she had recent flareup of her arthritis pain, was treated with low dose of steroid  She now has caregiver coming every other day, spent a few hours with her, she enjoying the trip to different shops,  She also has mild memory loss, word finding difficulties, denies family history of dementia, is on polypharmacy treatment, including oxycodone 5 mg 3 times a day, Lyrica 75 mg  UPDATE Jun 17 2020: She is accompanied by her daughter at today's clinical visit, she complains of constant multiple joints pain, was seen by rheumatologist, taking prednisone 7.5 mg daily, also Lyrica 75 mg 3 times a day, sleep well, has good appetite, also take frequent naps during the day  Today her main concern is slow worsening memory loss, left stove on, no longer feel comfortable using sharp knives, has caregiver coming with her few times each week, family do not feel safe  to leave her alone, occasionally agitated, increased urine urinary tract infection, improve afterwards.  NoW daughter is managing her medications now  UPDATE Jul 10 2021: Here today with Julie [name], her daughter.  She continue has mild memory loss, slow worsening, she has caregiver few times a week, her daughter Julie [name] has to take over managing her medications now, she complains of worsening low back pain, spend most of the time watching TV  Personally reviewed MRI of the brain in February 2022 showed no acute abnormality, stable atrophy, moderate small vessel disease.  When she was participating in physical therapy, which has helped her balance, and strength, but she is no longer keep up with her routine exercise anymore,  She is also under pain management for chronic knee pain, low back pain, taking oxycodone 5 mg every 4 hours as needed,  Personally reviewed MRI of lumbar June 27, 2021, L1-S1 severe bilateral neuroforaminal narrowing, mild to moderate spinal stenosis L4-5, moderate spinal stenosis L3-4, moderate to severe right and moderate left neuroforaminal narrowing,  Update Oct 09, 2021 SS:  REVIEW OF SYSTEMS: Full 14 system review of systems performed and notable only for as above  See HPI  ALLERGIES: Allergies  Allergen Reactions   Codeine Shortness Of Breath   Other     Walnuts-anaphylaxis (patient denies allergy states that she just does not eat WALNUTS)   Aspirin Other (See Comments)    Duodenal ulcer- cannot take aspirin when active.  HYPERSENSITIVE  TO  REGULAR DOSE.      HOME MEDICATIONS: Current Outpatient Medications  Medication Sig Dispense Refill   amLODipine (NORVASC) 10 MG tablet Take 10 mg by mouth daily.     cholecalciferol (VITAMIN D3) 25 MCG (1000 UNIT) tablet Take 1,000 Units by mouth daily.     diclofenac Sodium (VOLTAREN) 1 % GEL SMARTSIG:3 Inch(es) Topical 3 Times Daily PRN     DULoxetine (CYMBALTA) 30 MG capsule TAKE 1 CAPSULE BY MOUTH EVERY DAY 90 capsule 4   folic acid (FOLVITE) 1 MG tablet Take 2 mg by mouth every morning.     hydroxychloroquine (PLAQUENIL) 200 MG tablet Take 200 mg by mouth daily.      LIDOCAINE PAIN RELIEF 4 % PTCH Apply 1 patch topically 2 (two) times daily.     lovastatin (MEVACOR) 40 MG tablet 1 (ONE) TABLET TAKE IN THE EVENING AFTER DINNER (Patient taking differently: Take 40 mg by mouth every evening.) 90 tablet 1   metoprolol succinate (TOPROL-XL) 50 MG 24 hr tablet Take 50 mg by mouth every evening.   2   omeprazole (PRILOSEC) 40 MG capsule Take 40 mg by mouth daily.     oxyCODONE (OXY IR/ROXICODONE) 5 MG immediate release tablet Take 5 mg by mouth every 4 (four) hours as needed.     polyethylene glycol (MIRALAX / GLYCOLAX) 17 g packet Take 17 g by mouth daily.     predniSONE (DELTASONE) 5 MG tablet Take 7.5 mg by mouth daily.     pregabalin (LYRICA) 75 MG capsule Take 75-150 mg by mouth See admin instructions. 75 Mg in the Am and 150 MG at bedtime     valsartan-hydrochlorothiazide (DIOVAN-HCT) 320-12.5 MG tablet Take 0.5 tablets by mouth daily.     No current facility-administered medications for this visit.    PAST MEDICAL HISTORY: Past Medical History:  Diagnosis Date   Anemia    Anxiety    takes Klonopin at bedtime   Arthritis    takes Methotrexate weekly and Prednisone  Blood transfusion    no abnormal reaction noted   BRCA2 gene mutation positive 09/16/2020   Cancer ([place])    breast    Cataracts, bilateral    immature   Complication of anesthesia    wakes up during surgery   Depression    Diabetes mellitus without complication ([place])    Dyspnea    with exertion since chemo    Family history of colon cancer 08/30/2020   Family history of kidney cancer 08/30/2020   Family history of prostate cancer 08/30/2020   Fibromyalgia    takes Lyrica daily   GERD (gastroesophageal reflux disease)    takes Nexium daily   Glaucoma    Hard of hearing    Heart murmur    Histoplasmosis    History of colon polyps    History of gout    History of hiatal hernia    History of shingles    Hyperlipidemia    Hypertension    takes Amlodipine,Metoprolol,and Losartan  daily   Incontinence of bowel    Interstitial cystitis    Joint pain    Low back pain    Nocturia    Peripheral neuropathy    Peripheral neuropathy    Personal history of chemotherapy    Personal history of radiation therapy    PONV (postoperative nausea and vomiting)    Ulcer 1970   duodenal   Urinary frequency    Urinary urgency    Weakness    numbness in both hands and feet    PAST SURGICAL HISTORY: Past Surgical History:  Procedure Laterality Date   ABDOMINAL HYSTERECTOMY     accupuncture  3 yrs ago   ANKLE FUSION Left 05/10/2014   Procedure: LEFT ANKLE ARTHRODESIS ;  Surgeon: Wylene Simmer, MD;  Location: [place];  Service: Orthopedics;  Laterality: Left;   ANKLE SURGERY Left    arm surgery Left    radius   bladder tacked      BREAST LUMPECTOMY Left 10/2015   CERVICAL FUSION     CHOLECYSTECTOMY     COLONOSCOPY     CYSTOCELE REPAIR     DILATION AND CURETTAGE OF UTERUS     ESOPHAGOGASTRODUODENOSCOPY     PORT A CATH REVISION N/A 12/27/2015   Procedure: PORT A CATH REVISION;  Surgeon: Rolm Bookbinder, MD;  Location: [place];  Service: General;  Laterality: N/A;   PORT-A-CATH REMOVAL N/A 03/25/2016   Procedure: REMOVAL PORT-A-CATH;  Surgeon: Rolm Bookbinder, MD;  Location: [facility];  Service: General;  Laterality: N/A;   PORTACATH PLACEMENT Right 12/27/2015   Procedure: INSERTION PORT-A-CATH WITH ULTRASOUND ;  Surgeon: Rolm Bookbinder, MD;  Location: [place];  Service: General;  Laterality: Right;   RADIOACTIVE SEED GUIDED PARTIAL MASTECTOMY WITH AXILLARY SENTINEL LYMPH NODE BIOPSY Left 11/14/2015   Procedure: RADIOACTIVE SEED GUIDED LEFT BREAST LUMPECTOMY WITH AXILLARY SENTINEL LYMPH NODE BIOPSY;  Surgeon: Rolm Bookbinder, MD;  Location: [place];  Service: General;  Laterality: Left;  RADIOACTIVE SEED GUIDED LEFT BREAST LUMPECTOMY WITH AXILLARY SENTINEL LYMPH NODE BIOPSY   SALPINGOOPHORECTOMY Bilateral    TOTAL KNEE  ARTHROPLASTY Right 02/28/2015   Procedure: RIGHT TOTAL KNEE ARTHROPLASTY;  Surgeon: Rod Can, MD;  Location: [facility];  Service: Orthopedics;  Laterality: Right;   UPPER GASTROINTESTINAL ENDOSCOPY     VAGINAL HYSTERECTOMY      FAMILY HISTORY: Family History  Problem Relation Age of Onset   Colon cancer Sister 75   Kidney cancer Brother  dx after 64   Breast cancer Mother        dx after 45   Brain cancer Other    Other Father        cerebral hemorrhage   Breast cancer Maternal Aunt        several   Prostate cancer Brother        dx after 20    SOCIAL HISTORY: Social History   Socioeconomic History   Marital status: Divorced    Spouse name: Not on file   Number of children: 5   Years of education: some college   Highest education level: Not on file  Occupational History   Occupation: Retired  Tobacco Use   Smoking status: Never   Smokeless tobacco: Never  Scientific laboratory technician Use: Never used  Substance and Sexual Activity   Alcohol use: No    Comment: IN [place] ONLY   Drug use: No   Sexual activity: Not on file  Other Topics Concern   Not on file  Social History Narrative   Lives at home with her daughter.   Right-handed.   1 cup caffeine per day.   Social Determinants of Health   Financial Resource Strain: Not on file  Food Insecurity: Not on file  Transportation Needs: Not on file  Physical Activity: Not on file  Stress: Not on file  Social Connections: Not on file  Intimate Partner Violence: Not on file     PHYSICAL EXAM BP 150/70, HR 57  PHYSICAL EXAMNIATION:  Gen: NAD, conversant, well nourised, obese, well groomed, pleasant, cooperative,   NEUROLOGICAL EXAM:  MENTAL STATUS:    06/17/2020    7:43 AM 12/14/2019    7:56 AM  MMSE - Mini Mental State Exam  Orientation to time 5 4  Orientation to Place 5 5  Registration 3 1  Attention/ Calculation 5 3  Recall 1 3  Language- name 2 objects 2 2  Language- repeat 1 1  Language-  follow 3 step command 3 2  Language- read & follow direction 1 1  Write a sentence 1 1  Copy design 0 0  Total score 27 23    CRANIAL NERVES: CN II: Visual fields are full to confrontation  Pupils are round equal and briskly reactive to light. CN III, IV, VI: extraocular movement are normal. No ptosis. . CN VII: Face is symmetric with normal eye closure and smile. CN VIII: Hearing is normal to casual conversation bilaterally CN XI: Head turning and shoulder shrug are intact  MOTOR: Good strength of all extremities  REFLEXES: Reflexes are 1+ throughout  SENSORY: Soft touch sensation intact throughout  COORDINATION: Finger-nose-finger and heel-to-shin are normal bilaterally  GAIT/STANCE: She needs push-up to get up from seated position, rely on her walker, cautious, mildly unsteady   DIAGNOSTIC DATA (LABS, IMAGING, TESTING) - I reviewed patient records, labs, notes, testing and imaging myself where available.  ASSESSMENT AND PLAN  Julie [name] is a 88 y.o. female    Mild cognitive impairment Chronic low back pain radiating pain to bilateral hip,  X-ray of bilateral hip  Known history of multilevel lumbar degenerative disease  Is under the pain management  Emphasized importance of moderate exercise,  Add on Cymbalta 30 mg daily

## 2021-10-09 ENCOUNTER — Encounter: Payer: Self-pay | Admitting: Neurology

## 2021-10-09 ENCOUNTER — Ambulatory Visit: Payer: Medicare Other | Admitting: Neurology

## 2021-10-21 ENCOUNTER — Ambulatory Visit
Admission: RE | Admit: 2021-10-21 | Discharge: 2021-10-21 | Disposition: A | Discharge status: Home / Self Care | Payer: Medicare Other | Source: Ambulatory Visit | Attending: Family Medicine | Admitting: Family Medicine

## 2021-10-21 DIAGNOSIS — H532 Diplopia: Secondary | ICD-10-CM

## 2021-10-21 MED ORDER — GADOBENATE DIMEGLUMINE 529 MG/ML IV SOLN
20.0000 mL | Freq: Once | INTRAVENOUS | Status: AC | PRN
  Administered 2021-10-21: 20 mL via INTRAVENOUS

## 2021-10-29 ENCOUNTER — Other Ambulatory Visit: Payer: Self-pay | Admitting: Hematology

## 2021-10-29 DIAGNOSIS — Z1231 Encounter for screening mammogram for malignant neoplasm of breast: Secondary | ICD-10-CM

## 2021-11-10 DIAGNOSIS — M19032 Primary osteoarthritis, left wrist: Secondary | ICD-10-CM | POA: Insufficient documentation

## 2021-12-03 ENCOUNTER — Ambulatory Visit
Admission: RE | Admit: 2021-12-03 | Discharge: 2021-12-03 | Disposition: A | Discharge status: Home / Self Care | Payer: Medicare Other | Source: Ambulatory Visit | Attending: Hematology | Admitting: Hematology

## 2021-12-03 DIAGNOSIS — Z1231 Encounter for screening mammogram for malignant neoplasm of breast: Secondary | ICD-10-CM

## 2021-12-11 ENCOUNTER — Other Ambulatory Visit: Payer: Self-pay | Admitting: Neurology

## 2021-12-11 ENCOUNTER — Encounter: Payer: Self-pay | Admitting: Neurology

## 2021-12-11 ENCOUNTER — Ambulatory Visit (INDEPENDENT_AMBULATORY_CARE_PROVIDER_SITE_OTHER): Payer: Medicare Other | Admitting: Neurology

## 2021-12-11 VITALS — BP 162/77 | HR 60 | Ht 63.0 in | Wt 207.0 lb

## 2021-12-11 DIAGNOSIS — I679 Cerebrovascular disease, unspecified: Secondary | ICD-10-CM

## 2021-12-11 DIAGNOSIS — H532 Diplopia: Secondary | ICD-10-CM

## 2021-12-11 DIAGNOSIS — R269 Unspecified abnormalities of gait and mobility: Secondary | ICD-10-CM

## 2021-12-11 DIAGNOSIS — G3184 Mild cognitive impairment, so stated: Secondary | ICD-10-CM

## 2021-12-11 DIAGNOSIS — G459 Transient cerebral ischemic attack, unspecified: Secondary | ICD-10-CM

## 2021-12-11 MED ORDER — CLOPIDOGREL BISULFATE 75 MG PO TABS: 75.0000 mg | ORAL_TABLET | Freq: Every day | ORAL | 11 refills | Status: DC

## 2021-12-11 NOTE — Progress Notes (Signed)
PATIENT: Julie [name] DOB: 08/27/1932  Chief Complaint  Patient presents with   New Patient (Initial Visit)    Room 14, with daughter  Julie [name] LV 2023/Paper Proficient/Bath Leveda Anna MD [phone] vision Today c/o double vision, states it is constant, denies any other sx      ASSESSMENT AND PLAN  Julie [name] is a 88 y.o. female    Mild cognitive impairment Chronic low back pain radiating pain to bilateral hip  MRI lumbar in February 2023 showed multilevel degenerative changes, L5-S1 with severe bilateral foraminal narrowing, severe right foraminal narrowing L4-5, variable degree at other levels, no significant canal stenosis  Extensive cerebral small vessel disease Sudden onset double vision since early 2023,  MRI of the brain showed no acute abnormality, extensive small vessel disease,  Differentiation diagnoses include small vessel stroke involving brainstem, also need to rule out thyroid disease, neuromuscular junctional disorder,  Laboratory evaluations  Will stop aspirin, changed to Plavix 75 mg daily,  She does have vascular risk factors of hypertension, aging,  Ultrasound of carotid artery    DIAGNOSTIC DATA (LABS, IMAGING, TESTING) - I reviewed patient records, labs, notes, testing and imaging myself where available.  HISTORICAL  Julie [name] is a 88 year old female, seen in request by her primary care physician Dr. [name], [place], for evaluation of bilateral hands and feet paresthesia, initial evaluation was on January 09, 2019.  I have reviewed and summarized the referring note from the referring physician.   Past medical history of hypertension, hyperlipidemia, left breast cancer, status post left lobectomy, chemotherapy, in 2017, during that time, she developed bilateral fingertips and feet paresthesia, also had a history of chronic low back pain, carries a diagnosis of lumbar stenosis, has gradual onset gait  abnormality  Over the past 3 years, she continue to experience worsening bilateral fingertips and feet paresthesia, numbness tingling stay at the bottom of her feet, and toes, she also complains of worsening low back pain, radiating pain to bilateral lower extremities, last week while driving, she described difficulty moving her right leg off the paddle, she also complains of bilateral hands tremor, difficulty holding on small object  Hospital admission in July 2020 for generalized whole body achy pain, weakness, there was also described episode of staring spells at emergency room,  Echocardiogram December 14, 2018: Ejection fraction 60 to 65%, cavity size was normal, mild increased left ventricular wall thickness  MRI of the brain without contrast on March 13, 2018: No acute abnormality, moderate supratentorium small vessel disease  MRI of lumbar in January 2018, multilevel degenerative disc disease, mild to moderate canal stenosis at L2-3, L3-4, mild at L4-5, with variable degree of neural foraminal narrowing, severe neural foraminal narrowing at right L3-4, L4-5,  MRI cervical spine September 2020: Multilevel degenerative changes, evidence of anterior cervical discectomy and fusion at C 4, 5, 6  Laboratory evaluation in 2020 showed normal TSH, CBC, CMP showed mild elevated glucose 115, normal CPK, negative HIV, RPR, A1c of 6.0, mild elevated M spike 0.8 DL on protein electrophoresis, normal B12, C-reactive protein,  EMG nerve conduction study in August 2020 showed no large fiber peripheral neuropathy  UPDATE December 14 2019: She was seen in 2020 for evaluation of intermittent bilateral upper and lower extremity paresthesia, chronic low back pain, gait abnormality, she was referred for physical therapy, which has helped her, but never carried through the water aerobic as suggested  She spent most of the time watching TV, different game show,  not physically active, since 2020, she developed gradual  onset more noticeable intermittent body shaking, also complains of generalized weakness, weak handgrip, using both hands to hold a cup,  She is currently under pain management, taking Lyrica, also oxycodone, she had recent flareup of her arthritis pain, was treated with low dose of steroid  She now has caregiver coming every other day, spent a few hours with her, she enjoying the trip to different shops,  She also has mild memory loss, word finding difficulties, denies family history of dementia, is on polypharmacy treatment, including oxycodone 5 mg 3 times a day, Lyrica 75 mg  UPDATE Jun 17 2020: She is accompanied by her daughter at today's clinical visit, she complains of constant multiple joints pain, was seen by rheumatologist, taking prednisone 7.5 mg daily, also Lyrica 75 mg 3 times a day, sleep well, has good appetite, also take frequent naps during the day  Today her main concern is slow worsening memory loss, left stove on, no longer feel comfortable using sharp knives, has caregiver coming with her few times each week, family do not feel safe to leave her alone, occasionally agitated, increased urine urinary tract infection, improve afterwards.  NoW daughter is managing her medications now  UPDATE Jul 10 2021: Here today with [name], her daughter.  She continue has mild memory loss, slow worsening, she has caregiver few times a week, her daughter [name] has to take over managing her medications now, she complains of worsening low back pain, spend most of the time watching TV  Personally reviewed MRI of the brain in February 2022 showed no acute abnormality, stable atrophy, moderate small vessel disease.  When she was participating in physical therapy, which has helped her balance, and strength, but she is no longer keep up with her routine exercise anymore,  She is also under pain management for chronic knee pain, low back pain, taking oxycodone 5 mg every 4 hours as  needed,  Personally reviewed MRI of lumbar June 27, 2021, L1-S1 severe bilateral neuroforaminal narrowing, mild to moderate spinal stenosis L4-5, moderate spinal stenosis L3-4, moderate to severe right and moderate left neuroforaminal narrowing,  UPDATE December 11 2021: She did very well following physical therapy, later was able to carry out her exercise routine, working in gym 3 times a week with her caregiver, overall memory is stable,  Around March 2023, she complained sudden onset of double vision, this happened around the time when she suffered significant low back pain, already has difficulty walking  Primary care ordered MRI brain, I personally reviewed the film from Oct 21, 2021, MRI was taken few months after the onset of the symptoms no acute abnormality, advanced small vessel disease, with incidental findings of left frontal convexity meningioma,  Over the past few months, double vision persistent but intermittent,, oblique,    she denies other muscle weakness, such as difficulty swallowing talking, limb muscle weakness no droopy eyelid           Laboratory evaluation 2023, normal CBC hemoglobin of 11.9, TSH 0.16, LDL 113, A1c of 6.5, CMP with creatinine of 1.21,   PHYSICAL EXAM BP 150/70, HR 57  PHYSICAL EXAMNIATION:  Gen: NAD, conversant, well nourised, obese, well groomed, pleasant, cooperative, laughing                   Cardiovascular: Regular rate rhythm, no peripheral edema, warm, nontender. Eyes: Conjunctivae clear without exudates or hemorrhage Pulmonary: Clear to auscultation bilaterally   NEUROLOGICAL EXAM:  MENTAL STATUS:    06/17/2020    7:43 AM 12/14/2019    7:56 AM  MMSE - Mini Mental State Exam  Orientation to time 5 4  Orientation to Place 5 5  Registration 3 1  Attention/ Calculation 5 3  Recall 1 3  Language- name 2 objects 2 2  Language- repeat 1 1  Language- follow 3 step command 3 2  Language- read & follow direction 1 1  Write a sentence 1 1   Copy design 0 0  Total score 27 23    CRANIAL NERVES: CN II: Visual fields are full to confrontation  Pupils are round equal and briskly reactive to light. CN III, IV, VI: extraocular movement are normal. No ptosis.  Covering uncover showed bilateral exophoria . CN VII: Face is symmetric with normal eye closure and smile. CN VIII: Hearing is normal to casual conversation bilaterally CN XI: Head turning and shoulder shrug are intact  MOTOR: Good strength of all extremities  REFLEXES: Reflexes are 1+ throughout  SENSORY: Soft touch sensation intact throughout  COORDINATION: Finger-nose-finger and heel-to-shin are normal bilaterally  GAIT/STANCE: She needs push-up to get up from seated position, rely on her walker, cautious, mildly unsteady      REVIEW OF SYSTEMS: Full 14 system review of systems performed and notable only for as above  See HPI  ALLERGIES: Allergies  Allergen Reactions   Codeine Shortness Of Breath   Other     Walnuts-anaphylaxis (patient denies allergy states that she just does not eat WALNUTS)   Aspirin Other (See Comments)    Duodenal ulcer- cannot take aspirin when active.  HYPERSENSITIVE  TO  REGULAR DOSE.      HOME MEDICATIONS: Current Outpatient Medications  Medication Sig Dispense Refill   amLODipine (NORVASC) 10 MG tablet Take 10 mg by mouth daily.     cholecalciferol (VITAMIN D3) 25 MCG (1000 UNIT) tablet Take 1,000 Units by mouth daily.     diclofenac Sodium (VOLTAREN) 1 % GEL SMARTSIG:3 Inch(es) Topical 3 Times Daily PRN     folic acid (FOLVITE) 1 MG tablet Take 2 mg by mouth every morning.     hydroxychloroquine (PLAQUENIL) 200 MG tablet Take 200 mg by mouth daily.     LIDOCAINE PAIN RELIEF 4 % PTCH Apply 1 patch topically 2 (two) times daily.     lovastatin (MEVACOR) 40 MG tablet 1 (ONE) TABLET TAKE IN THE EVENING AFTER DINNER (Patient taking differently: Take 40 mg by mouth every evening.) 90 tablet 1   metoprolol succinate  (TOPROL-XL) 50 MG 24 hr tablet Take 50 mg by mouth every evening.   2   omeprazole (PRILOSEC) 40 MG capsule Take 40 mg by mouth daily.     oxyCODONE (OXY IR/ROXICODONE) 5 MG immediate release tablet Take 5 mg by mouth every 4 (four) hours as needed.     polyethylene glycol (MIRALAX / GLYCOLAX) 17 g packet Take 17 g by mouth daily.     predniSONE (DELTASONE) 5 MG tablet Take 7.5 mg by mouth daily.     pregabalin (LYRICA) 75 MG capsule Take 75-150 mg by mouth See admin instructions. 75 Mg in the Am and 150 MG at bedtime     valsartan-hydrochlorothiazide (DIOVAN-HCT) 320-12.5 MG tablet Take 0.5 tablets by mouth daily.     clopidogrel (PLAVIX) 75 MG tablet TAKE 1 TABLET BY MOUTH EVERY DAY 30 tablet 11   No current facility-administered medications for this visit.    PAST MEDICAL HISTORY: Past Medical History:  Diagnosis Date   Anemia    Anxiety    takes Klonopin at bedtime   Arthritis    takes Methotrexate weekly and Prednisone   Blood transfusion    no abnormal reaction noted   BRCA2 gene mutation positive 09/16/2020   Cancer ([place])    breast    Cataracts, bilateral    immature   Complication of anesthesia    wakes up during surgery   Depression    Diabetes mellitus without complication ([place])    Dyspnea    with exertion since chemo    Family history of colon cancer 08/30/2020   Family history of kidney cancer 08/30/2020   Family history of prostate cancer 08/30/2020   Fibromyalgia    takes Lyrica daily   GERD (gastroesophageal reflux disease)    takes Nexium daily   Glaucoma    Hard of hearing    Heart murmur    Histoplasmosis    History of colon polyps    History of gout    History of hiatal hernia    History of shingles    Hyperlipidemia    Hypertension    takes Amlodipine,Metoprolol,and Losartan daily   Incontinence of bowel    Interstitial cystitis    Joint pain    Low back pain    Nocturia    Peripheral neuropathy    Peripheral neuropathy    Personal history of  chemotherapy    Personal history of radiation therapy    PONV (postoperative nausea and vomiting)    Ulcer 1970   duodenal   Urinary frequency    Urinary urgency    Weakness    numbness in both hands and feet    PAST SURGICAL HISTORY: Past Surgical History:  Procedure Laterality Date   ABDOMINAL HYSTERECTOMY     accupuncture  3 yrs ago   ANKLE FUSION Left 05/10/2014   Procedure: LEFT ANKLE ARTHRODESIS ;  Surgeon: Wylene Simmer, MD;  Location: [facility];  Service: Orthopedics;  Laterality: Left;   ANKLE SURGERY Left    arm surgery Left    radius   bladder tacked      BREAST LUMPECTOMY Left 10/2015   CERVICAL FUSION     CHOLECYSTECTOMY     COLONOSCOPY     CYSTOCELE REPAIR     DILATION AND CURETTAGE OF UTERUS     ESOPHAGOGASTRODUODENOSCOPY     PORT A CATH REVISION N/A 12/27/2015   Procedure: PORT A CATH REVISION;  Surgeon: Rolm Bookbinder, MD;  Location: [facility];  Service: General;  Laterality: N/A;   PORT-A-CATH REMOVAL N/A 03/25/2016   Procedure: REMOVAL PORT-A-CATH;  Surgeon: Rolm Bookbinder, MD;  Location: [facility];  Service: General;  Laterality: N/A;   PORTACATH PLACEMENT Right 12/27/2015   Procedure: INSERTION PORT-A-CATH WITH ULTRASOUND ;  Surgeon: Rolm Bookbinder, MD;  Location: [facility];  Service: General;  Laterality: Right;   RADIOACTIVE SEED GUIDED PARTIAL MASTECTOMY WITH AXILLARY SENTINEL LYMPH NODE BIOPSY Left 11/14/2015   Procedure: RADIOACTIVE SEED GUIDED LEFT BREAST LUMPECTOMY WITH AXILLARY SENTINEL LYMPH NODE BIOPSY;  Surgeon: Rolm Bookbinder, MD;  Location: [facility];  Service: General;  Laterality: Left;  RADIOACTIVE SEED GUIDED LEFT BREAST LUMPECTOMY WITH AXILLARY SENTINEL LYMPH NODE BIOPSY   SALPINGOOPHORECTOMY Bilateral    TOTAL KNEE ARTHROPLASTY Right 02/28/2015   Procedure: RIGHT TOTAL KNEE ARTHROPLASTY;  Surgeon: Rod Can, MD;  Location: [facility];  Service: Orthopedics;  Laterality: Right;   UPPER  GASTROINTESTINAL ENDOSCOPY  VAGINAL HYSTERECTOMY      FAMILY HISTORY: Family History  Problem Relation Age of Onset   Colon cancer Sister 56   Kidney cancer Brother        dx after 39   Breast cancer Mother        dx after 66   Brain cancer Other    Other Father        cerebral hemorrhage   Breast cancer Maternal Aunt        several   Prostate cancer Brother        dx after 32    SOCIAL HISTORY: Social History   Socioeconomic History   Marital status: Divorced    Spouse name: Not on file   Number of children: 5   Years of education: some college   Highest education level: Not on file  Occupational History   Occupation: Retired  Tobacco Use   Smoking status: Never   Smokeless tobacco: Never  Scientific laboratory technician Use: Never used  Substance and Sexual Activity   Alcohol use: No    Comment: IN [place] ONLY   Drug use: No   Sexual activity: Not on file  Other Topics Concern   Not on file  Social History Narrative   Lives at home with her daughter.   Right-handed.   1 cup caffeine per day.   Social Determinants of Health   Financial Resource Strain: Not on file  Food Insecurity: Not on file  Transportation Needs: Not on file  Physical Activity: Not on file  Stress: Not on file  Social Connections: Not on file  Intimate Partner Violence: Not on file      Marcial Pacas, M.D. Ph.D.  [facility] [place], [place] [address] Phone: [phone] Fax:      [phone]

## 2021-12-12 ENCOUNTER — Telehealth: Payer: Self-pay | Admitting: Neurology

## 2021-12-12 LAB — THYROID PANEL WITH TSH
Free Thyroxine Index: 2.4 (ref 1.2–4.9)
T3 Uptake Ratio: 32 % (ref 24–39)
T4, Total: 7.5 ug/dL (ref 4.5–12.0)
TSH: 1.16 u[IU]/mL (ref 0.450–4.500)

## 2021-12-12 LAB — ACETYLCHOLINE RECEPTOR, BINDING: AChR Binding Ab, Serum: <0.03 nmol/L (ref 0.00–0.24)

## 2021-12-12 NOTE — Telephone Encounter (Signed)
Daughter would like to know if pt is permitted to fly, please call.

## 2021-12-12 NOTE — Telephone Encounter (Signed)
Per vo by Dr. Krista Blue, there should not be any reason for flying restrictions. The patient's daughter has been informed.

## 2021-12-12 NOTE — Telephone Encounter (Signed)
Please call patient, I have reviewed her MRI findings with neuroradiologist, extensive white matter disease, only minimum microhemorrhage  She should proceed with Plavix 75 mg daily as prescribed

## 2021-12-12 NOTE — Telephone Encounter (Signed)
I spoke to her daughter and provided her with the MRI results. She will get her mother started on Plavix '75mg'$ , one tablet daily. Dr. Krista Blue has sent the prescription to the pharmacy.

## 2021-12-16 ENCOUNTER — Ambulatory Visit: Payer: Medicare Other | Admitting: Neurology

## 2021-12-16 ENCOUNTER — Institutional Professional Consult (permissible substitution): Payer: Medicare Other | Admitting: Neurology

## 2021-12-22 ENCOUNTER — Ambulatory Visit (HOSPITAL_COMMUNITY)
Admission: RE | Admit: 2021-12-22 | Discharge: 2021-12-22 | Disposition: A | Discharge status: Home / Self Care | Payer: Medicare Other | Source: Ambulatory Visit | Attending: Neurology | Admitting: Neurology

## 2021-12-22 DIAGNOSIS — G459 Transient cerebral ischemic attack, unspecified: Secondary | ICD-10-CM | POA: Diagnosis present

## 2021-12-22 DIAGNOSIS — I679 Cerebrovascular disease, unspecified: Secondary | ICD-10-CM | POA: Diagnosis present

## 2021-12-22 DIAGNOSIS — R269 Unspecified abnormalities of gait and mobility: Secondary | ICD-10-CM | POA: Diagnosis present

## 2021-12-22 DIAGNOSIS — H532 Diplopia: Secondary | ICD-10-CM | POA: Diagnosis present

## 2021-12-22 DIAGNOSIS — G3184 Mild cognitive impairment, so stated: Secondary | ICD-10-CM | POA: Diagnosis present

## 2021-12-29 ENCOUNTER — Telehealth: Payer: Self-pay

## 2021-12-29 ENCOUNTER — Other Ambulatory Visit: Payer: Self-pay

## 2021-12-29 DIAGNOSIS — Z171 Estrogen receptor negative status [ER-]: Secondary | ICD-10-CM

## 2021-12-29 NOTE — Progress Notes (Signed)
Carotid ultrasound shows mild stenosis (1-39%) bilaterally. She should continue her Plavix and cholesterol medication

## 2021-12-29 NOTE — Telephone Encounter (Signed)
I called pt relayed results, she verbalized understanding and appreciation for the call.

## 2021-12-29 NOTE — Telephone Encounter (Signed)
-----   Message from Genia Harold, MD sent at 12/29/2021 10:36 AM EDT ----- Carotid ultrasound shows mild stenosis (1-39%) bilaterally. She should continue her Plavix and cholesterol medication

## 2021-12-30 ENCOUNTER — Encounter: Payer: Self-pay | Admitting: Hematology

## 2021-12-30 ENCOUNTER — Inpatient Hospital Stay (HOSPITAL_BASED_OUTPATIENT_CLINIC_OR_DEPARTMENT_OTHER): Payer: Medicare Other | Admitting: Hematology

## 2021-12-30 ENCOUNTER — Inpatient Hospital Stay: Payer: Medicare Other | Attending: Hematology

## 2021-12-30 ENCOUNTER — Other Ambulatory Visit: Payer: Self-pay

## 2021-12-30 VITALS — BP 175/69 | HR 58 | Temp 98.4°F | Resp 19 | Ht 63.0 in | Wt 208.3 lb

## 2021-12-30 DIAGNOSIS — C50411 Malignant neoplasm of upper-outer quadrant of right female breast: Secondary | ICD-10-CM

## 2021-12-30 DIAGNOSIS — D472 Monoclonal gammopathy: Secondary | ICD-10-CM | POA: Diagnosis present

## 2021-12-30 DIAGNOSIS — M47816 Spondylosis without myelopathy or radiculopathy, lumbar region: Secondary | ICD-10-CM | POA: Insufficient documentation

## 2021-12-30 DIAGNOSIS — Z171 Estrogen receptor negative status [ER-]: Secondary | ICD-10-CM

## 2021-12-30 DIAGNOSIS — D649 Anemia, unspecified: Secondary | ICD-10-CM | POA: Insufficient documentation

## 2021-12-30 DIAGNOSIS — Z853 Personal history of malignant neoplasm of breast: Secondary | ICD-10-CM | POA: Insufficient documentation

## 2021-12-30 DIAGNOSIS — I10 Essential (primary) hypertension: Secondary | ICD-10-CM | POA: Insufficient documentation

## 2021-12-30 DIAGNOSIS — Z08 Encounter for follow-up examination after completed treatment for malignant neoplasm: Secondary | ICD-10-CM | POA: Diagnosis not present

## 2021-12-30 DIAGNOSIS — M810 Age-related osteoporosis without current pathological fracture: Secondary | ICD-10-CM | POA: Insufficient documentation

## 2021-12-30 DIAGNOSIS — M069 Rheumatoid arthritis, unspecified: Secondary | ICD-10-CM | POA: Diagnosis not present

## 2021-12-30 LAB — CBC WITH DIFFERENTIAL (CANCER CENTER ONLY)
Abs Immature Granulocytes: 0.02 10*3/uL (ref 0.00–0.07)
Basophils Absolute: 0.1 10*3/uL (ref 0.0–0.1)
Basophils Relative: 1 %
Eosinophils Absolute: 0.1 10*3/uL (ref 0.0–0.5)
Eosinophils Relative: 2 %
HCT: 36.6 % (ref 36.0–46.0)
Hemoglobin: 11.8 g/dL — ABNORMAL LOW (ref 12.0–15.0)
Immature Granulocytes: 0 %
Lymphocytes Relative: 14 %
Lymphs Abs: 0.9 10*3/uL (ref 0.7–4.0)
MCH: 27.3 pg (ref 26.0–34.0)
MCHC: 32.2 g/dL (ref 30.0–36.0)
MCV: 84.7 fL (ref 80.0–100.0)
Monocytes Absolute: 0.6 10*3/uL (ref 0.1–1.0)
Monocytes Relative: 9 %
Neutro Abs: 4.6 10*3/uL (ref 1.7–7.7)
Neutrophils Relative %: 74 %
Platelet Count: 200 10*3/uL (ref 150–400)
RBC: 4.32 MIL/uL (ref 3.87–5.11)
RDW: 15.8 % — ABNORMAL HIGH (ref 11.5–15.5)
WBC Count: 6.3 10*3/uL (ref 4.0–10.5)
nRBC: 0 % (ref 0.0–0.2)

## 2021-12-30 LAB — CMP (CANCER CENTER ONLY)
ALT: 17 U/L (ref 0–44)
AST: 22 U/L (ref 15–41)
Albumin: 4.1 g/dL (ref 3.5–5.0)
Alkaline Phosphatase: 72 U/L (ref 38–126)
Anion gap: 5 (ref 5–15)
BUN: 30 mg/dL — ABNORMAL HIGH (ref 8–23)
CO2: 32 mmol/L (ref 22–32)
Calcium: 9.1 mg/dL (ref 8.9–10.3)
Chloride: 104 mmol/L (ref 98–111)
Creatinine: 1.22 mg/dL — ABNORMAL HIGH (ref 0.44–1.00)
GFR, Estimated: 43 mL/min — ABNORMAL LOW (ref >60)
Glucose, Bld: 116 mg/dL — ABNORMAL HIGH (ref 70–99)
Potassium: 3.7 mmol/L (ref 3.5–5.1)
Sodium: 141 mmol/L (ref 135–145)
Total Bilirubin: 0.7 mg/dL (ref 0.3–1.2)
Total Protein: 7.1 g/dL (ref 6.5–8.1)

## 2021-12-30 NOTE — Progress Notes (Signed)
[place]   [phone] [phone]   Clinic Follow up Note   Patient Care Team: [name], DO as PCP - General (Family Medicine) Valinda Party, MD as Referring Physician (Rheumatology) Rolm Bookbinder, MD as Consulting Physician (General Surgery) Truitt Merle, MD as Consulting Physician (Hematology) Delice Bison Charlestine Massed, NP as Nurse Practitioner (Hematology and Oncology)  Date of Service:  12/30/2021  CHIEF COMPLAINT: f/u of left breast cancer, MGUS  CURRENT THERAPY:  Surveillance  ASSESSMENT & PLAN:  Julie [name] is a 88 y.o. post-hysterectomy female with   1. Malignant neoplasm of upper outer quadrant of left breast, invasive ductal carcinoma, grade 3, pT1cN0M0, stage IA, triple negative -diagnosed in 09/2015. S/p left breast lumpectomy, adjuvant Taxol/Abraxane and adjuvant radiation. She is currently on Surveillance.  -most recent mammogram 12/03/21 was negative. -She is clinically doing well. Lab reviewed, overall stable. Her physical exam was unremarkable. There is no clinical concern for recurrence. -He has been 6 years since her diagnosis, her risk of recurrence is minimal now.   2. Genetics, BRCA2+ -She has a history of breast cancer in her mother and maternal aunt -She had genetic testing on 08/29/20, results revealed pathogenic variant in BRCA2.   3. MGUS  -Her M-protein has been around 0.8-0.9 since 2017  -No clinical concern for disease progression. Will continue follow up SPE/IFE, and light chain levels every 3-6 months. -most recent M protein from 07/25/20 remains stable at 0.7. Today's MM panel is still pending. Given her blood counts and kidney/liver functions are stable, will hold bone marrow biopsy for now.   4. Osteoporosis -10/2017 DEXA shows osteoporosis with lowest T-score of -2.7 at Forearm radius.  -She is at high fracture risk due to her frequent falls.  -she started Prolia injections q46months on 02/01/20. She  tolerated poorly with 2 weeks of jaw aches.  -She is currently on Vit D. -I encouraged her to continue weightbearing exercise   5. Joint and muscular pain, Rheumatoid arthritis, lumbar spondylosis -She will continue follow up with rheumatologist Dr. Dossie Der.  -She has chronic low back pain from lumber spondylosis and sacrum. Her CT Lumbar spine from 09/07/19 showed increased spinal stenosis of her lumbar spine. -Pain is stable and controlled.    6. Anemia, intermittent   -Pt notes 2021 endoscopy by Dr Benson [place]. Last colonoscopy was in 2012.  -stable and intermittent, hgb 11.8 today (12/30/21)   7. HTN -Continue medications and continue to Follow with Dr. Maudie Mercury on this.     Plan -Lab reviewed, she is clinically stable, continue observation -Lab and follow-up in 1 year   No problem-specific Assessment & Plan notes found for this encounter.   SUMMARY OF ONCOLOGIC HISTORY: Oncology History Overview Note  Malignant neoplasm of upper outer quadrant of female breast [facility])   Staging form: Breast, AJCC 7th Edition     Pathologic stage from 11/14/2015: Stage IA (T1c, N0, cM0) - Signed by Truitt Merle, MD on 11/25/2015     Clinical stage from 11/20/2015: Stage IA (T1c, N0, M0) - Signed by Curt Bears, MD on 11/20/2015     Malignant neoplasm of upper outer quadrant of female breast ([place])  10/09/2015 Mammogram   Screening bilateral mammograms showed a possible left breast mass. Diagnostic mammogram and ultrasound showed a 7 x 7 x 7 mm mass in the left breast at 11:00 position.   10/22/2015 Receptors her2   ER negative, PR negative, HER-2 not amplified   10/22/2015 Initial Biopsy   Left  breast needle core biopsy at the 1:00 position showed invasive ductal carcinoma.   10/22/2015 Initial Diagnosis   Malignant neoplasm of upper outer quadrant of female breast ([place])   11/14/2015 Surgery   Left breast lumpectomy and sentinel lymph node biopsy [name])   11/14/2015 Pathology Results   Left breast  lumpectomy showed invasive ductal carcinoma, grade 3, 1.1 cm, resection margins were negative, sentinel lymph node biopsy showed benign breast parenchyma, lymph node tissue not identified. Repeated ER PR and HER-2 were all negative.   12/20/2015 - 03/13/2016 Chemotherapy   Weekly Taxol 80 mg/m x 2 cycles, then changed to Abraxane 100 mg/m for cycles 3-12 due to infusion reaction. Abraxane dose-reduced cycle #9, then again for cycles #11 & #12 due to neuropathy. [total 12 cycles chemo]    04/01/2016 - 05/11/2016 Radiation Therapy   Adjuvant breast radiation ([place]). Left breast: 50.4 Gy in 28 fractions   06/19/2016 Imaging   MR Lumbar Spine without contrast  IMPRESSION: 1. Diffuse lumbar spondylosis with discogenic and facet degenerative changes progressed from 2010 lumbar MRI. 2. Multilevel canal stenosis is mild-to-moderate at L2-3, moderate L3-4, and mild at L4-5. 3. Multiple levels of mild and moderate neural foraminal narrowing with severe foraminal narrowing at the right L3-4 and L4-5 levels.   11/12/2017 Imaging   11/12/2017 DEXA ASSESSMENT: The BMD measured at Forearm Radius 33% is 0.648 g/cm2 with a T-score of -2.7. This patient is considered osteoporotic according to [facility]) criteria.   11/12/2017 Mammogram   11/12/2017 Diagnostic Mammogram IMPRESSION: No mammographic evidence of malignancy in either breast, status post left lumpectomy.   09/16/2020 Genetic Testing   Pathogenic variant in BRCA2 detected at c.4284dup (p.Gln1429Serfs*9) through [facility] Multi-Cancer +RNA Panel.  Variant of uncertain significance detected in MSH3 at c.2600T>C (p.Ile867Thr).  The report date is September 16, 2020.  The Multi-Cancer + RNA Panel offered by [facility] includes sequencing and/or deletion/duplication analysis of the following 84 genes:  AIP*, ALK, APC*, ATM*, AXIN2*, BAP1*, BARD1*, BLM*, BMPR1A*, BRCA1*, BRCA2*, BRIP1*, CASR, CDC73*, CDH1*, CDK4, CDKN1B*, CDKN1C*, CDKN2A,  CEBPA, CHEK2*, CTNNA1*, DICER1*, DIS3L2*, EGFR, EPCAM, FH*, FLCN*, GATA2*, GPC3, GREM1, HOXB13, HRAS, KIT, MAX*, MEN1*, MET, MITF, MLH1*, MSH2*, MSH3*, MSH6*, MUTYH*, NBN*, NF1*, NF2*, NTHL1*, PALB2*, PDGFRA, PHOX2B, PMS2*, POLD1*, POLE*, POT1*, PRKAR1A*, PTCH1*, PTEN*, RAD50*, RAD51C*, RAD51D*, RB1*, RECQL4, RET, RUNX1*, SDHA*, SDHAF2*, SDHB*, SDHC*, SDHD*, SMAD4*, SMARCA4*, SMARCB1*, SMARCE1*, STK11*, SUFU*, TERC, TERT, TMEM127*, Tp53*, TSC1*, TSC2*, VHL*, WRN*, and WT1.  RNA analysis is performed for * genes.      INTERVAL HISTORY:  VONETTE GROSSO is here for a follow up of breast cancer. She was last seen by me in 07/2020. She presents to the clinic accompanied by her daughter. She reports she is doing well overall. Her daughter notes Julie [name] was found to have an issue with her retina.   All other systems were reviewed with the patient and are negative.  MEDICAL HISTORY:  Past Medical History:  Diagnosis Date   Anemia    Anxiety    takes Klonopin at bedtime   Arthritis    takes Methotrexate weekly and Prednisone   Blood transfusion    no abnormal reaction noted   BRCA2 gene mutation positive 09/16/2020   Cancer ([place])    breast    Cataracts, bilateral    immature   Complication of anesthesia    wakes up during surgery   Depression    Diabetes mellitus without complication (HCC)    Dyspnea    with  exertion since chemo    Family history of colon cancer 08/30/2020   Family history of kidney cancer 08/30/2020   Family history of prostate cancer 08/30/2020   Fibromyalgia    takes Lyrica daily   GERD (gastroesophageal reflux disease)    takes Nexium daily   Glaucoma    Hard of hearing    Heart murmur    Histoplasmosis    History of colon polyps    History of gout    History of hiatal hernia    History of shingles    Hyperlipidemia    Hypertension    takes Amlodipine,Metoprolol,and Losartan daily   Incontinence of bowel    Interstitial cystitis    Joint pain    Low back pain     Nocturia    Peripheral neuropathy    Peripheral neuropathy    Personal history of chemotherapy    Personal history of radiation therapy    PONV (postoperative nausea and vomiting)    Ulcer 1970   duodenal   Urinary frequency    Urinary urgency    Weakness    numbness in both hands and feet    SURGICAL HISTORY: Past Surgical History:  Procedure Laterality Date   ABDOMINAL HYSTERECTOMY     accupuncture  3 yrs ago   ANKLE FUSION Left 05/10/2014   Procedure: LEFT ANKLE ARTHRODESIS ;  Surgeon: Wylene Simmer, MD;  Location: [place];  Service: Orthopedics;  Laterality: Left;   ANKLE SURGERY Left    arm surgery Left    radius   bladder tacked      BREAST LUMPECTOMY Left 10/2015   CERVICAL FUSION     CHOLECYSTECTOMY     COLONOSCOPY     CYSTOCELE REPAIR     DILATION AND CURETTAGE OF UTERUS     ESOPHAGOGASTRODUODENOSCOPY     PORT A CATH REVISION N/A 12/27/2015   Procedure: PORT A CATH REVISION;  Surgeon: Rolm Bookbinder, MD;  Location: [place];  Service: General;  Laterality: N/A;   PORT-A-CATH REMOVAL N/A 03/25/2016   Procedure: REMOVAL PORT-A-CATH;  Surgeon: Rolm Bookbinder, MD;  Location: [facility];  Service: General;  Laterality: N/A;   PORTACATH PLACEMENT Right 12/27/2015   Procedure: INSERTION PORT-A-CATH WITH ULTRASOUND ;  Surgeon: Rolm Bookbinder, MD;  Location: [place];  Service: General;  Laterality: Right;   RADIOACTIVE SEED GUIDED PARTIAL MASTECTOMY WITH AXILLARY SENTINEL LYMPH NODE BIOPSY Left 11/14/2015   Procedure: RADIOACTIVE SEED GUIDED LEFT BREAST LUMPECTOMY WITH AXILLARY SENTINEL LYMPH NODE BIOPSY;  Surgeon: Rolm Bookbinder, MD;  Location: [place];  Service: General;  Laterality: Left;  RADIOACTIVE SEED GUIDED LEFT BREAST LUMPECTOMY WITH AXILLARY SENTINEL LYMPH NODE BIOPSY   SALPINGOOPHORECTOMY Bilateral    TOTAL KNEE ARTHROPLASTY Right 02/28/2015   Procedure: RIGHT TOTAL KNEE ARTHROPLASTY;  Surgeon: Rod Can,  MD;  Location: [facility];  Service: Orthopedics;  Laterality: Right;   UPPER GASTROINTESTINAL ENDOSCOPY     VAGINAL HYSTERECTOMY      I have reviewed the social history and family history with the patient and they are unchanged from previous note.  ALLERGIES:  is allergic to codeine, other, and aspirin.  MEDICATIONS:  Current Outpatient Medications  Medication Sig Dispense Refill   amLODipine (NORVASC) 10 MG tablet Take 10 mg by mouth daily.     cholecalciferol (VITAMIN D3) 25 MCG (1000 UNIT) tablet Take 1,000 Units by mouth daily.     clopidogrel (PLAVIX) 75 MG tablet TAKE 1 TABLET BY MOUTH EVERY  DAY 30 tablet 11   diclofenac Sodium (VOLTAREN) 1 % GEL SMARTSIG:3 Inch(es) Topical 3 Times Daily PRN     folic acid (FOLVITE) 1 MG tablet Take 2 mg by mouth every morning.     hydroxychloroquine (PLAQUENIL) 200 MG tablet Take 200 mg by mouth daily.     LIDOCAINE PAIN RELIEF 4 % PTCH Apply 1 patch topically 2 (two) times daily.     lovastatin (MEVACOR) 40 MG tablet 1 (ONE) TABLET TAKE IN THE EVENING AFTER DINNER (Patient taking differently: Take 40 mg by mouth every evening.) 90 tablet 1   metoprolol succinate (TOPROL-XL) 50 MG 24 hr tablet Take 50 mg by mouth every evening.   2   omeprazole (PRILOSEC) 40 MG capsule Take 40 mg by mouth daily.     oxyCODONE (OXY IR/ROXICODONE) 5 MG immediate release tablet Take 5 mg by mouth every 4 (four) hours as needed.     polyethylene glycol (MIRALAX / GLYCOLAX) 17 g packet Take 17 g by mouth daily.     predniSONE (DELTASONE) 5 MG tablet Take 7.5 mg by mouth daily.     pregabalin (LYRICA) 75 MG capsule Take 75-150 mg by mouth See admin instructions. 75 Mg in the Am and 150 MG at bedtime     valsartan-hydrochlorothiazide (DIOVAN-HCT) 320-12.5 MG tablet Take 0.5 tablets by mouth daily.     No current facility-administered medications for this visit.    PHYSICAL EXAMINATION: ECOG PERFORMANCE STATUS: 2 - Symptomatic, <50% confined to bed  Vitals:   12/30/21  1232  BP: (!) 175/69  Pulse: (!) 58  Resp: 19  Temp: 98.4 F (36.9 C)  SpO2: 96%   Wt Readings from Last 3 Encounters:  12/30/21 208 lb 4.8 oz (94.5 kg)  12/11/21 207 lb (93.9 kg)  06/27/21 207 lb (93.9 kg)     GENERAL:alert, no distress and comfortable SKIN: skin color, texture, turgor are normal, no rashes or significant lesions EYES: normal, Conjunctiva are pink and non-injected, sclera clear  NECK: supple, thyroid normal size, non-tender, without nodularity LYMPH:  no palpable lymphadenopathy in the cervical, axillary LUNGS: clear to auscultation and percussion with normal breathing effort HEART: regular rate & rhythm and no murmurs and no lower extremity edema ABDOMEN:abdomen soft, non-tender and normal bowel sounds Musculoskeletal:no cyanosis of digits and no clubbing  NEURO: alert & oriented x 3 with fluent speech, no focal motor/sensory deficits BREAST: No palpable mass, nodules or adenopathy bilaterally. Breast exam benign.   LABORATORY DATA:  I have reviewed the data as listed    Latest Ref Rng & Units 12/30/2021   11:37 AM 06/27/2021    8:10 PM 07/25/2020    7:24 AM  CBC  WBC 4.0 - 10.5 K/uL 6.3  6.5  4.1   Hemoglobin 12.0 - 15.0 g/dL 11.8  12.5  11.1   Hematocrit 36.0 - 46.0 % 36.6  40.1  36.0   Platelets 150 - 400 K/uL 200  226  185         Latest Ref Rng & Units 12/30/2021   11:37 AM 06/27/2021    8:10 PM 07/25/2020    7:24 AM  CMP  Glucose 70 - 99 mg/dL 116  148  97   BUN 8 - 23 mg/dL $Remove'30  26  27   'zlbaRVU$ Creatinine 0.44 - 1.00 mg/dL 1.22  1.06  1.26   Sodium 135 - 145 mmol/L 141  139  141   Potassium 3.5 - 5.1 mmol/L 3.7  3.6  3.9  Chloride 98 - 111 mmol/L 104  101  107   CO2 22 - 32 mmol/L 32  30  26   Calcium 8.9 - 10.3 mg/dL 9.1  9.2  8.6   Total Protein 6.5 - 8.1 g/dL 7.1  7.0  6.9   Total Bilirubin 0.3 - 1.2 mg/dL 0.7  0.7  0.6   Alkaline Phos 38 - 126 U/L 72  57  65   AST 15 - 41 U/L $Remo'22  21  24   'nqNQe$ ALT 0 - 44 U/L $Remo'17  17  14       'OhxwA$ RADIOGRAPHIC  STUDIES: I have personally reviewed the radiological images as listed and agreed with the findings in the report. No results found.    No orders of the defined types were placed in this encounter.  All questions were answered. The patient knows to call the clinic with any problems, questions or concerns. No barriers to learning was detected. The total time spent in the appointment was 25 minutes.     Truitt Merle, MD 12/30/2021   I, Wilburn Mylar, am acting as scribe for Truitt Merle, MD.   I have reviewed the above documentation for accuracy and completeness, and I agree with the above.

## 2021-12-31 LAB — KAPPA/LAMBDA LIGHT CHAINS
Kappa free light chain: 51.5 mg/L — ABNORMAL HIGH (ref 3.3–19.4)
Kappa, lambda light chain ratio: 4.4 — ABNORMAL HIGH (ref 0.26–1.65)
Lambda free light chains: 11.7 mg/L (ref 5.7–26.3)

## 2022-01-02 LAB — MULTIPLE MYELOMA PANEL, SERUM
Albumin SerPl Elph-Mcnc: 3.6 g/dL (ref 2.9–4.4)
Albumin/Glob SerPl: 1.4 (ref 0.7–1.7)
Alpha 1: 0.2 g/dL (ref 0.0–0.4)
Alpha2 Glob SerPl Elph-Mcnc: 0.5 g/dL (ref 0.4–1.0)
B-Globulin SerPl Elph-Mcnc: 1.3 g/dL (ref 0.7–1.3)
Gamma Glob SerPl Elph-Mcnc: 0.7 g/dL (ref 0.4–1.8)
Globulin, Total: 2.7 g/dL (ref 2.2–3.9)
IgA: 765 mg/dL — ABNORMAL HIGH (ref 64–422)
IgG (Immunoglobin G), Serum: 746 mg/dL (ref 586–1602)
IgM (Immunoglobulin M), Srm: 44 mg/dL (ref 26–217)
M Protein SerPl Elph-Mcnc: 0.7 g/dL — ABNORMAL HIGH
Total Protein ELP: 6.3 g/dL (ref 6.0–8.5)

## 2022-01-06 ENCOUNTER — Telehealth: Payer: Self-pay

## 2022-01-06 NOTE — Telephone Encounter (Signed)
-----   Message from Truitt Merle, MD sent at 01/04/2022 10:52 PM EDT ----- Please let pt know her M-protein and light chain level are all stable, no concerns, thx  Truitt Merle  01/04/2022

## 2022-01-06 NOTE — Telephone Encounter (Signed)
This nurse reached out to patient and spoke with patients daughter [name] and made her aware of lab results and no other recommendations at this time.  No questions or concerns asked from daughter.

## 2022-05-08 ENCOUNTER — Telehealth: Payer: Self-pay | Admitting: Neurology

## 2022-05-08 NOTE — Telephone Encounter (Signed)
Pt daughter is calling. Stated pt is having pain and can't walk. She is requesting a sooner appointment with Dr. Krista Blue and a call back from nurse.

## 2022-05-13 NOTE — Telephone Encounter (Signed)
Called pt daughter. She said pt is doing a lot better now. Stated she will keep [place] updated on pt.

## 2022-05-13 NOTE — Telephone Encounter (Signed)
Noted, thank you

## 2022-06-23 ENCOUNTER — Ambulatory Visit: Payer: Medicare Other | Admitting: Neurology

## 2022-07-29 ENCOUNTER — Ambulatory Visit: Payer: Medicare Other | Admitting: Neurology

## 2022-07-29 ENCOUNTER — Ambulatory Visit (INDEPENDENT_AMBULATORY_CARE_PROVIDER_SITE_OTHER): Payer: Medicare Other | Admitting: Neurology

## 2022-07-29 ENCOUNTER — Encounter: Payer: Self-pay | Admitting: Neurology

## 2022-07-29 VITALS — BP 160/90 | HR 61 | Ht 63.0 in | Wt 208.0 lb

## 2022-07-29 DIAGNOSIS — I679 Cerebrovascular disease, unspecified: Secondary | ICD-10-CM | POA: Diagnosis not present

## 2022-07-29 DIAGNOSIS — M5442 Lumbago with sciatica, left side: Secondary | ICD-10-CM

## 2022-07-29 DIAGNOSIS — M5441 Lumbago with sciatica, right side: Secondary | ICD-10-CM | POA: Diagnosis not present

## 2022-07-29 DIAGNOSIS — G8929 Other chronic pain: Secondary | ICD-10-CM | POA: Diagnosis not present

## 2022-07-29 NOTE — Progress Notes (Signed)
PATIENT: Julie [name] DOB: 29-Jun-1932  Chief Complaint  Patient presents with   Follow-up    Rm 37, with daughter  States her gait has improved, no new concerns     ASSESSMENT AND PLAN  Julie [name] is a 89 y.o. female    1.  Mild cognitive impairment -Has improved, lives with her daughter   2.  Chronic low back pain radiating pain to bilateral hip -Continue seeing pain management, weaning dose of oxycodone, gait is improved -MRI lumbar in February 2023 showed multilevel degenerative changes, L5-S1 with severe bilateral foraminal narrowing, severe right foraminal narrowing L4-5, variable degree at other levels, no significant canal stenosis  3.  Extensive cerebral small vessel disease 4.  Macular degeneration, double vision since early 2023, getting injections  -No longer taking Plavix, unclear why, daughter will check on this, we have recommended due to extensive cerebral small vessel disease, cannot tolerate aspirin -MRI of the brain showed no acute abnormality, extensive small vessel disease -Discussed management of vascular risk factors, close follow-up with PCP, return here as needed  DIAGNOSTIC DATA (LABS, IMAGING, TESTING) - I reviewed patient records, labs, notes, testing and imaging myself where available.  HISTORICAL  Julie [name] is a 89 year old female, seen in request by her primary care physician Dr. [name], [place], for evaluation of bilateral hands and feet paresthesia, initial evaluation was on January 09, 2019.  I have reviewed and summarized the referring note from the referring physician.   Past medical history of hypertension, hyperlipidemia, left breast cancer, status post left lobectomy, chemotherapy, in 2017, during that time, she developed bilateral fingertips and feet paresthesia, also had a history of chronic low back pain, carries a diagnosis of lumbar stenosis, has gradual onset gait abnormality  Over the past 3 years, she continue to  experience worsening bilateral fingertips and feet paresthesia, numbness tingling stay at the bottom of her feet, and toes, she also complains of worsening low back pain, radiating pain to bilateral lower extremities, last week while driving, she described difficulty moving her right leg off the paddle, she also complains of bilateral hands tremor, difficulty holding on small object  Hospital admission in July 2020 for generalized whole body achy pain, weakness, there was also described episode of staring spells at emergency room,  Echocardiogram December 14, 2018: Ejection fraction 60 to 65%, cavity size was normal, mild increased left ventricular wall thickness  MRI of the brain without contrast on March 13, 2018: No acute abnormality, moderate supratentorium small vessel disease  MRI of lumbar in January 2018, multilevel degenerative disc disease, mild to moderate canal stenosis at L2-3, L3-4, mild at L4-5, with variable degree of neural foraminal narrowing, severe neural foraminal narrowing at right L3-4, L4-5,  MRI cervical spine September 2020: Multilevel degenerative changes, evidence of anterior cervical discectomy and fusion at C 4, 5, 6  Laboratory evaluation in 2020 showed normal TSH, CBC, CMP showed mild elevated glucose 115, normal CPK, negative HIV, RPR, A1c of 6.0, mild elevated M spike 0.8 DL on protein electrophoresis, normal B12, C-reactive protein,  EMG nerve conduction study in August 2020 showed no large fiber peripheral neuropathy  UPDATE December 14 2019: She was seen in 2020 for evaluation of intermittent bilateral upper and lower extremity paresthesia, chronic low back pain, gait abnormality, she was referred for physical therapy, which has helped her, but never carried through the water aerobic as suggested  She spent most of the time watching TV, different game show, not physically  active, since 2020, she developed gradual onset more noticeable intermittent body shaking, also  complains of generalized weakness, weak handgrip, using both hands to hold a cup,  She is currently under pain management, taking Lyrica, also oxycodone, she had recent flareup of her arthritis pain, was treated with low dose of steroid  She now has caregiver coming every other day, spent a few hours with her, she enjoying the trip to different shops,  She also has mild memory loss, word finding difficulties, denies family history of dementia, is on polypharmacy treatment, including oxycodone 5 mg 3 times a day, Lyrica 75 mg  UPDATE Jun 17 2020: She is accompanied by her daughter at today's clinical visit, she complains of constant multiple joints pain, was seen by rheumatologist, taking prednisone 7.5 mg daily, also Lyrica 75 mg 3 times a day, sleep well, has good appetite, also take frequent naps during the day  Today her main concern is slow worsening memory loss, left stove on, no longer feel comfortable using sharp knives, has caregiver coming with her few times each week, family do not feel safe to leave her alone, occasionally agitated, increased urine urinary tract infection, improve afterwards.  NoW daughter is managing her medications now  UPDATE Jul 10 2021: Here today with [name], her daughter.  She continue has mild memory loss, slow worsening, she has caregiver few times a week, her daughter [name] has to take over managing her medications now, she complains of worsening low back pain, spend most of the time watching TV  Personally reviewed MRI of the brain in February 2022 showed no acute abnormality, stable atrophy, moderate small vessel disease.  When she was participating in physical therapy, which has helped her balance, and strength, but she is no longer keep up with her routine exercise anymore,  She is also under pain management for chronic knee pain, low back pain, taking oxycodone 5 mg every 4 hours as needed,  Personally reviewed MRI of lumbar June 27, 2021, L1-S1  severe bilateral neuroforaminal narrowing, mild to moderate spinal stenosis L4-5, moderate spinal stenosis L3-4, moderate to severe right and moderate left neuroforaminal narrowing,  UPDATE December 11 2021: She did very well following physical therapy, later was able to carry out her exercise routine, working in gym 3 times a week with her caregiver, overall memory is stable,  Around March 2023, she complained sudden onset of double vision, this happened around the time when she suffered significant low back pain, already has difficulty walking  Primary care ordered MRI brain, I personally reviewed the film from Oct 21, 2021, MRI was taken few months after the onset of the symptoms no acute abnormality, advanced small vessel disease, with incidental findings of left frontal convexity meningioma,  Over the past few months, double vision persistent but intermittent,, oblique,    she denies other muscle weakness, such as difficulty swallowing talking, limb muscle weakness no droopy eyelid           Laboratory evaluation 2023, normal CBC hemoglobin of 11.9, TSH 0.16, LDL 113, A1c of 6.5, CMP with creatinine of 1.21,  Update July 29, 2022 SS: Plavix 75 mg daily was started by Dr. Krista Blue at last visit, carotid ultrasound showed mild stenosis bilaterally.  Acetylcholine receptor binding, TSH was normal. Here with her daughter, doing well, still double vision due to macular degeneration, getting injections. Living with her daughter. Has an aide 3 days a week. Uses a cane, no falls. Goes to pain clinic monthly, still on  oxycodone, working on decreasing. Has RA in wrists, back on prednisone 5 mg daily, then taper back to 2.5. is not taking Plavix, not clear why. In the past aspirin caused stomach upset, even enteric coated. Feels memory better than what it had been, it is not significant. Walks a mile few days a week, rides a stationary bike. BP usually runs around 120/80's  NEUROLOGICAL EXAM:  MENTAL STATUS:     06/17/2020    7:43 AM 12/14/2019    7:56 AM  MMSE - Mini Mental State Exam  Orientation to time 5 4  Orientation to Place 5 5  Registration 3 1  Attention/ Calculation 5 3  Recall 1 3  Language- name 2 objects 2 2  Language- repeat 1 1  Language- follow 3 step command 3 2  Language- read & follow direction 1 1  Write a sentence 1 1  Copy design 0 0  Total score 27 23   Physical Exam  General: The patient is alert and cooperative at the time of the examination.  Skin: No significant peripheral edema is noted.  Neurologic Exam  Mental status: The patient is alert and oriented x 3 at the time of the examination. The patient has apparent normal recent and remote memory, with an apparently normal attention span and concentration ability.  Daughter provides collaborative history.  Patient is hard of hearing.  Cranial nerves: Facial symmetry is present. Speech is normal, no aphasia or dysarthria is noted. Extraocular movements are full. Visual fields are full.  Motor: The patient has good strength in all 4 extremities.  Wearing a brace to left wrist  Sensory examination: Soft touch sensation is symmetric on the face, arms, and legs.  Coordination: The patient has good finger-nose-finger and heel-to-shin bilaterally.  Gait and station: The patient has a wide-based gait, is cautious, uses single-point cane, mildly unsteady Reflexes: Deep tendon reflexes are symmetric but decreased  REVIEW OF SYSTEMS: Full 14 system review of systems performed and notable only for as above  See HPI  ALLERGIES: Allergies  Allergen Reactions   Codeine Shortness Of Breath   Other     Walnuts-anaphylaxis (patient denies allergy states that she just does not eat WALNUTS)   Aspirin Other (See Comments)    Duodenal ulcer- cannot take aspirin when active.  HYPERSENSITIVE  TO  REGULAR DOSE.      HOME MEDICATIONS: Current Outpatient Medications  Medication Sig Dispense Refill   amLODipine (NORVASC)  10 MG tablet Take 10 mg by mouth daily.     cholecalciferol (VITAMIN D3) 25 MCG (1000 UNIT) tablet Take 1,000 Units by mouth daily.     diclofenac Sodium (VOLTAREN) 1 % GEL SMARTSIG:3 Inch(es) Topical 3 Times Daily PRN     erythromycin ophthalmic ointment Place 1 Application into the left eye 3 (three) times daily.     folic acid (FOLVITE) 1 MG tablet Take 2 mg by mouth every morning.     hydroxychloroquine (PLAQUENIL) 200 MG tablet Take 200 mg by mouth daily.     LIDOCAINE PAIN RELIEF 4 % PTCH Apply 1 patch topically 2 (two) times daily.     lovastatin (MEVACOR) 40 MG tablet 1 (ONE) TABLET TAKE IN THE EVENING AFTER DINNER (Patient taking differently: Take 40 mg by mouth every evening.) 90 tablet 1   metoprolol succinate (TOPROL-XL) 50 MG 24 hr tablet Take 50 mg by mouth every evening.   2   omeprazole (PRILOSEC) 40 MG capsule Take 40 mg by mouth daily.  oxyCODONE (OXY IR/ROXICODONE) 5 MG immediate release tablet Take 5 mg by mouth every 4 (four) hours as needed.     polyethylene glycol (MIRALAX / GLYCOLAX) 17 g packet Take 17 g by mouth daily.     predniSONE (DELTASONE) 5 MG tablet Take 7.5 mg by mouth daily.     pregabalin (LYRICA) 75 MG capsule Take 75-150 mg by mouth See admin instructions. 75 Mg in the Am and 150 MG at bedtime     valsartan-hydrochlorothiazide (DIOVAN-HCT) 320-12.5 MG tablet Take 0.5 tablets by mouth daily.     No current facility-administered medications for this visit.    PAST MEDICAL HISTORY: Past Medical History:  Diagnosis Date   Anemia    Anxiety    takes Klonopin at bedtime   Arthritis    takes Methotrexate weekly and Prednisone   Blood transfusion    no abnormal reaction noted   BRCA2 gene mutation positive 09/16/2020   Cancer ([place])    breast    Cataracts, bilateral    immature   Complication of anesthesia    wakes up during surgery   Depression    Diabetes mellitus without complication ([place])    Dyspnea    with exertion since chemo    Family  history of colon cancer 08/30/2020   Family history of kidney cancer 08/30/2020   Family history of prostate cancer 08/30/2020   Fibromyalgia    takes Lyrica daily   GERD (gastroesophageal reflux disease)    takes Nexium daily   Glaucoma    Hard of hearing    Heart murmur    Histoplasmosis    History of colon polyps    History of gout    History of hiatal hernia    History of shingles    Hyperlipidemia    Hypertension    takes Amlodipine,Metoprolol,and Losartan daily   Incontinence of bowel    Interstitial cystitis    Joint pain    Low back pain    Nocturia    Peripheral neuropathy    Peripheral neuropathy    Personal history of chemotherapy    Personal history of radiation therapy    PONV (postoperative nausea and vomiting)    Ulcer 1970   duodenal   Urinary frequency    Urinary urgency    Weakness    numbness in both hands and feet    PAST SURGICAL HISTORY: Past Surgical History:  Procedure Laterality Date   ABDOMINAL HYSTERECTOMY     accupuncture  3 yrs ago   ANKLE FUSION Left 05/10/2014   Procedure: LEFT ANKLE ARTHRODESIS ;  Surgeon: Wylene Simmer, MD;  Location: [facility];  Service: Orthopedics;  Laterality: Left;   ANKLE SURGERY Left    arm surgery Left    radius   bladder tacked      BREAST LUMPECTOMY Left 10/2015   CERVICAL FUSION     CHOLECYSTECTOMY     COLONOSCOPY     CYSTOCELE REPAIR     DILATION AND CURETTAGE OF UTERUS     ESOPHAGOGASTRODUODENOSCOPY     PORT A CATH REVISION N/A 12/27/2015   Procedure: PORT A CATH REVISION;  Surgeon: Rolm Bookbinder, MD;  Location: [facility];  Service: General;  Laterality: N/A;   PORT-A-CATH REMOVAL N/A 03/25/2016   Procedure: REMOVAL PORT-A-CATH;  Surgeon: Rolm Bookbinder, MD;  Location: [facility];  Service: General;  Laterality: N/A;   PORTACATH PLACEMENT Right 12/27/2015   Procedure: INSERTION PORT-A-CATH WITH ULTRASOUND ;  Surgeon: Rolm Bookbinder, MD;  Location: [facility];  Service:  General;  Laterality: Right;   RADIOACTIVE SEED GUIDED PARTIAL MASTECTOMY WITH AXILLARY SENTINEL LYMPH NODE BIOPSY Left 11/14/2015   Procedure: RADIOACTIVE SEED GUIDED LEFT BREAST LUMPECTOMY WITH AXILLARY SENTINEL LYMPH NODE BIOPSY;  Surgeon: Rolm Bookbinder, MD;  Location: [facility];  Service: General;  Laterality: Left;  RADIOACTIVE SEED GUIDED LEFT BREAST LUMPECTOMY WITH AXILLARY SENTINEL LYMPH NODE BIOPSY   SALPINGOOPHORECTOMY Bilateral    TOTAL KNEE ARTHROPLASTY Right 02/28/2015   Procedure: RIGHT TOTAL KNEE ARTHROPLASTY;  Surgeon: Rod Can, MD;  Location: [facility];  Service: Orthopedics;  Laterality: Right;   UPPER GASTROINTESTINAL ENDOSCOPY     VAGINAL HYSTERECTOMY      FAMILY HISTORY: Family History  Problem Relation Age of Onset   Colon cancer Sister 29   Kidney cancer Brother        dx after 17   Breast cancer Mother        dx after 65   Brain cancer Other    Other Father        cerebral hemorrhage   Breast cancer Maternal Aunt        several   Prostate cancer Brother        dx after 23    SOCIAL HISTORY: Social History   Socioeconomic History   Marital status: Divorced    Spouse name: Not on file   Number of children: 5   Years of education: some college   Highest education level: Not on file  Occupational History   Occupation: Retired  Tobacco Use   Smoking status: Never   Smokeless tobacco: Never  Scientific laboratory technician Use: Never used  Substance and Sexual Activity   Alcohol use: No    Comment: IN [facility] ONLY   Drug use: No   Sexual activity: Not on file  Other Topics Concern   Not on file  Social History Narrative   Lives at home with her daughter.   Right-handed.   1 cup caffeine per day.   Social Determinants of Health   Financial Resource Strain: Not on file  Food Insecurity: Not on file  Transportation Needs: Not on file  Physical Activity: Not on file  Stress: Not on file  Social Connections: Not on file  Intimate  Partner Violence: Not on file   Butler Denmark, Laqueta Jean, [facility] [address], [place], [place] [address] [phone].

## 2022-07-29 NOTE — Patient Instructions (Addendum)
Check on the Plavix, we had recommended at last visit  Continue follow up with primary care  Recommend monitor BP, is elevated today ideally < 130/90 Return here as needed

## 2022-09-01 ENCOUNTER — Other Ambulatory Visit: Payer: Self-pay | Admitting: Family Medicine

## 2022-09-01 DIAGNOSIS — N6320 Unspecified lump in the left breast, unspecified quadrant: Secondary | ICD-10-CM

## 2022-09-14 ENCOUNTER — Ambulatory Visit
Admission: RE | Admit: 2022-09-14 | Discharge: 2022-09-14 | Disposition: A | Discharge status: Home / Self Care | Payer: Medicare Other | Source: Ambulatory Visit | Attending: Family Medicine | Admitting: Family Medicine

## 2022-09-14 ENCOUNTER — Other Ambulatory Visit: Payer: Medicare Other

## 2022-09-14 DIAGNOSIS — N6320 Unspecified lump in the left breast, unspecified quadrant: Secondary | ICD-10-CM

## 2022-10-07 ENCOUNTER — Other Ambulatory Visit: Payer: Self-pay | Admitting: Family Medicine

## 2022-10-07 DIAGNOSIS — Z1231 Encounter for screening mammogram for malignant neoplasm of breast: Secondary | ICD-10-CM

## 2022-12-07 ENCOUNTER — Ambulatory Visit
Admission: RE | Admit: 2022-12-07 | Discharge: 2022-12-07 | Disposition: A | Discharge status: Home / Self Care | Payer: Medicare Other | Source: Ambulatory Visit | Attending: Family Medicine | Admitting: Family Medicine

## 2022-12-07 DIAGNOSIS — Z1231 Encounter for screening mammogram for malignant neoplasm of breast: Secondary | ICD-10-CM

## 2022-12-31 ENCOUNTER — Other Ambulatory Visit: Payer: Self-pay

## 2022-12-31 ENCOUNTER — Inpatient Hospital Stay: Payer: Medicare Other | Attending: Hematology

## 2022-12-31 ENCOUNTER — Inpatient Hospital Stay (HOSPITAL_BASED_OUTPATIENT_CLINIC_OR_DEPARTMENT_OTHER): Payer: Medicare Other | Admitting: Hematology

## 2022-12-31 VITALS — BP 156/83 | HR 55 | Temp 98.3°F | Resp 18 | Ht 63.0 in | Wt 208.7 lb

## 2022-12-31 DIAGNOSIS — Z171 Estrogen receptor negative status [ER-]: Secondary | ICD-10-CM

## 2022-12-31 DIAGNOSIS — M810 Age-related osteoporosis without current pathological fracture: Secondary | ICD-10-CM | POA: Diagnosis not present

## 2022-12-31 DIAGNOSIS — C50411 Malignant neoplasm of upper-outer quadrant of right female breast: Secondary | ICD-10-CM

## 2022-12-31 DIAGNOSIS — Z853 Personal history of malignant neoplasm of breast: Secondary | ICD-10-CM | POA: Insufficient documentation

## 2022-12-31 DIAGNOSIS — D472 Monoclonal gammopathy: Secondary | ICD-10-CM | POA: Diagnosis present

## 2022-12-31 DIAGNOSIS — Z1509 Genetic susceptibility to other malignant neoplasm: Secondary | ICD-10-CM

## 2022-12-31 DIAGNOSIS — D649 Anemia, unspecified: Secondary | ICD-10-CM | POA: Insufficient documentation

## 2022-12-31 DIAGNOSIS — Z1501 Genetic susceptibility to malignant neoplasm of breast: Secondary | ICD-10-CM | POA: Diagnosis not present

## 2022-12-31 DIAGNOSIS — I10 Essential (primary) hypertension: Secondary | ICD-10-CM | POA: Insufficient documentation

## 2022-12-31 LAB — CBC WITH DIFFERENTIAL (CANCER CENTER ONLY)
Abs Immature Granulocytes: 0.01 10*3/uL (ref 0.00–0.07)
Basophils Absolute: 0 10*3/uL (ref 0.0–0.1)
Basophils Relative: 1 %
Eosinophils Absolute: 0.2 10*3/uL (ref 0.0–0.5)
Eosinophils Relative: 4 %
HCT: 34.9 % — ABNORMAL LOW (ref 36.0–46.0)
Hemoglobin: 11.1 g/dL — ABNORMAL LOW (ref 12.0–15.0)
Immature Granulocytes: 0 %
Lymphocytes Relative: 19 %
Lymphs Abs: 0.9 10*3/uL (ref 0.7–4.0)
MCH: 26.9 pg (ref 26.0–34.0)
MCHC: 31.8 g/dL (ref 30.0–36.0)
MCV: 84.5 fL (ref 80.0–100.0)
Monocytes Absolute: 0.5 10*3/uL (ref 0.1–1.0)
Monocytes Relative: 11 %
Neutro Abs: 3 10*3/uL (ref 1.7–7.7)
Neutrophils Relative %: 65 %
Platelet Count: 192 10*3/uL (ref 150–400)
RBC: 4.13 MIL/uL (ref 3.87–5.11)
RDW: 16.5 % — ABNORMAL HIGH (ref 11.5–15.5)
WBC Count: 4.6 10*3/uL (ref 4.0–10.5)
nRBC: 0 % (ref 0.0–0.2)

## 2022-12-31 LAB — CMP (CANCER CENTER ONLY)
ALT: 19 U/L (ref 0–44)
AST: 32 U/L (ref 15–41)
Albumin: 3.9 g/dL (ref 3.5–5.0)
Alkaline Phosphatase: 94 U/L (ref 38–126)
Anion gap: 5 (ref 5–15)
BUN: 22 mg/dL (ref 8–23)
CO2: 31 mmol/L (ref 22–32)
Calcium: 8.8 mg/dL — ABNORMAL LOW (ref 8.9–10.3)
Chloride: 107 mmol/L (ref 98–111)
Creatinine: 1.01 mg/dL — ABNORMAL HIGH (ref 0.44–1.00)
GFR, Estimated: 54 mL/min — ABNORMAL LOW (ref >60)
Glucose, Bld: 110 mg/dL — ABNORMAL HIGH (ref 70–99)
Potassium: 4.1 mmol/L (ref 3.5–5.1)
Sodium: 143 mmol/L (ref 135–145)
Total Bilirubin: 0.6 mg/dL (ref 0.3–1.2)
Total Protein: 7 g/dL (ref 6.5–8.1)

## 2022-12-31 NOTE — Progress Notes (Addendum)
[facility]   [phone] [phone]   Clinic Follow up Note   Patient Care Team: Irena Reichmann, DO as PCP - General (Family Medicine) Rossie Muskrat, MD as Referring Physician (Rheumatology) Emelia Loron, MD as Consulting Physician (General Surgery) Malachy Mood, MD as Consulting Physician (Hematology) Axel Filler Larna Daughters, NP as Nurse Practitioner (Hematology and Oncology)  Date of Service:  12/31/2022  CHIEF COMPLAINT: f/u of left breast cancer, MGUS  CURRENT THERAPY:  Surveillance  ASSESSMENT & PLAN:  Julie [name] is a 89 y.o. post-hysterectomy female with   1. Malignant neoplasm of upper outer quadrant of left breast, invasive ductal carcinoma, grade 3, pT1cN0M0, stage IA, triple negative -diagnosed in 09/2015. S/p left breast lumpectomy, adjuvant Taxol/Abraxane and adjuvant radiation. She is currently on Surveillance.  -most recent mammogram 12/03/21 was negative. -She is clinically doing well. Lab reviewed, overall stable. Her physical exam was unremarkable. There is no clinical concern for recurrence. -He has been 6 years since her diagnosis, her risk of recurrence is minimal now.   2. Genetics, BRCA2+ -She has a history of breast cancer in her mother and maternal aunt -She had genetic testing on 08/29/20, results revealed pathogenic variant in BRCA2. -All her 3 daughters have been tested positive and evaluated by their physicians.    3. MGUS  -Her M-protein has been around 0.8-0.9 since 2017  -No clinical concern for disease progression. Will continue follow up SPE/IFE, and light chain levels every 3-6 months. -most recent M protein from 07/25/20 remains stable at 0.7. Today's MM panel is still pending. Given her blood counts and kidney/liver functions are stable, will hold bone marrow biopsy for now.   4. Osteoporosis -10/2017 DEXA shows osteoporosis with lowest T-score of -2.7 at Forearm radius.  -She is at high fracture risk  due to her frequent falls.  -she started Prolia injections q21months on 02/01/20. She tolerated poorly with 2 weeks of jaw aches.  -She is currently on Vit D. -I encouraged her to continue weightbearing exercise   5. Joint and muscular pain, Rheumatoid arthritis, lumbar spondylosis -She will continue follow up with rheumatologist Dr. Kathi Ludwig.  -She has chronic low back pain from lumber spondylosis and sacrum. Her CT Lumbar spine from 09/07/19 showed increased spinal stenosis of her lumbar spine. -Pain is stable and controlled.    6. Anemia, intermittent   -Pt notes 2021 endoscopy by Dr Elnoria Howard. Last colonoscopy was in 2012.  -stable and intermittent, hgb 11.8 today (12/30/21)   7. HTN -Continue medications and continue to Follow with Dr. Selena Batten on this.     Plan -Lab reviewed, her exam was unremarkable -Continue cancer surveillance and llama monitoring for MGUS -Due to her advanced age, I will see her back once a year.    SUMMARY OF ONCOLOGIC HISTORY: Oncology History Overview Note  Malignant neoplasm of upper outer quadrant of female breast [facility])   Staging form: Breast, AJCC 7th Edition     Pathologic stage from 11/14/2015: Stage IA (T1c, N0, cM0) - Signed by Malachy Mood, MD on 11/25/2015     Clinical stage from 11/20/2015: Stage IA (T1c, N0, M0) - Signed by Si Gaul, MD on 11/20/2015     Malignant neoplasm of upper outer quadrant of female breast (HCC)  10/09/2015 Mammogram   Screening bilateral mammograms showed a possible left breast mass. Diagnostic mammogram and ultrasound showed a 7 x 7 x 7 mm mass in the left breast at 11:00 position.   10/22/2015 Receptors her2  ER negative, PR negative, HER-2 not amplified   10/22/2015 Initial Biopsy   Left breast needle core biopsy at the 1:00 position showed invasive ductal carcinoma.   10/22/2015 Initial Diagnosis   Malignant neoplasm of upper outer quadrant of female breast (HCC)   11/14/2015 Surgery   Left breast lumpectomy and sentinel  lymph node biopsy Dwain Sarna)   11/14/2015 Pathology Results   Left breast lumpectomy showed invasive ductal carcinoma, grade 3, 1.1 cm, resection margins were negative, sentinel lymph node biopsy showed benign breast parenchyma, lymph node tissue not identified. Repeated ER PR and HER-2 were all negative.   12/20/2015 - 03/13/2016 Chemotherapy   Weekly Taxol 80 mg/m x 2 cycles, then changed to Abraxane 100 mg/m for cycles 3-12 due to infusion reaction. Abraxane dose-reduced cycle #9, then again for cycles #11 & #12 due to neuropathy. [total 12 cycles chemo]    04/01/2016 - 05/11/2016 Radiation Therapy   Adjuvant breast radiation ([facility]). Left breast: 50.4 Gy in 28 fractions   06/19/2016 Imaging   MR Lumbar Spine without contrast  IMPRESSION: 1. Diffuse lumbar spondylosis with discogenic and facet degenerative changes progressed from 2010 lumbar MRI. 2. Multilevel canal stenosis is mild-to-moderate at L2-3, moderate L3-4, and mild at L4-5. 3. Multiple levels of mild and moderate neural foraminal narrowing with severe foraminal narrowing at the right L3-4 and L4-5 levels.   11/12/2017 Imaging   11/12/2017 DEXA ASSESSMENT: The BMD measured at Forearm Radius 33% is 0.648 g/cm2 with a T-score of -2.7. This patient is considered osteoporotic according to World Health Organization [facility]) criteria.   11/12/2017 Mammogram   11/12/2017 Diagnostic Mammogram IMPRESSION: No mammographic evidence of malignancy in either breast, status post left lumpectomy.   09/16/2020 Genetic Testing   Pathogenic variant in BRCA2 detected at c.4284dup (p.Gln1429Serfs*9) through [facility] Multi-Cancer +RNA Panel.  Variant of uncertain significance detected in MSH3 at c.2600T>C (p.Ile867Thr).  The report date is September 16, 2020.  The Multi-Cancer + RNA Panel offered by [facility] includes sequencing and/or deletion/duplication analysis of the following 84 genes:  AIP*, ALK, APC*, ATM*, AXIN2*, BAP1*, BARD1*, BLM*, BMPR1A*,  BRCA1*, BRCA2*, BRIP1*, CASR, CDC73*, CDH1*, CDK4, CDKN1B*, CDKN1C*, CDKN2A, CEBPA, CHEK2*, CTNNA1*, DICER1*, DIS3L2*, EGFR, EPCAM, FH*, FLCN*, GATA2*, GPC3, GREM1, HOXB13, HRAS, KIT, MAX*, MEN1*, MET, MITF, MLH1*, MSH2*, MSH3*, MSH6*, MUTYH*, NBN*, NF1*, NF2*, NTHL1*, PALB2*, PDGFRA, PHOX2B, PMS2*, POLD1*, POLE*, POT1*, PRKAR1A*, PTCH1*, PTEN*, RAD50*, RAD51C*, RAD51D*, RB1*, RECQL4, RET, RUNX1*, SDHA*, SDHAF2*, SDHB*, SDHC*, SDHD*, SMAD4*, SMARCA4*, SMARCB1*, SMARCE1*, STK11*, SUFU*, TERC, TERT, TMEM127*, Tp53*, TSC1*, TSC2*, VHL*, WRN*, and WT1.  RNA analysis is performed for * genes.      INTERVAL HISTORY:  Julie [name] is here for a follow up of breast cancer. She was last seen by me in 12/30/2021. She presents to the clinic accompanied by her daughter. She is doing well overall, does have multiple joint pain, does not lay in the left wrist and that she received some injections.  She has been also seeing a retina specialist and receiving injections in her eyes.  No other new pain, or other concerns.  She is able to function well at home.   All other systems were reviewed with the patient and are negative.  MEDICAL HISTORY:  Past Medical History:  Diagnosis Date   Anemia    Anxiety    takes Klonopin at bedtime   Arthritis    takes Methotrexate weekly and Prednisone   Blood transfusion    no abnormal reaction noted   BRCA2 gene mutation  positive 09/16/2020   Cancer (HCC)    breast    Cataracts, bilateral    immature   Complication of anesthesia    wakes up during surgery   Depression    Diabetes mellitus without complication (HCC)    Dyspnea    with exertion since chemo    Family history of colon cancer 08/30/2020   Family history of kidney cancer 08/30/2020   Family history of prostate cancer 08/30/2020   Fibromyalgia    takes Lyrica daily   GERD (gastroesophageal reflux disease)    takes Nexium daily   Glaucoma    Hard of hearing    Heart murmur    Histoplasmosis    History  of colon polyps    History of gout    History of hiatal hernia    History of shingles    Hyperlipidemia    Hypertension    takes Amlodipine,Metoprolol,and Losartan daily   Incontinence of bowel    Interstitial cystitis    Joint pain    Low back pain    Nocturia    Peripheral neuropathy    Peripheral neuropathy    Personal history of chemotherapy    Personal history of radiation therapy    PONV (postoperative nausea and vomiting)    Ulcer 1970   duodenal   Urinary frequency    Urinary urgency    Weakness    numbness in both hands and feet    SURGICAL HISTORY: Past Surgical History:  Procedure Laterality Date   ABDOMINAL HYSTERECTOMY     accupuncture  3 yrs ago   ANKLE FUSION Left 05/10/2014   Procedure: LEFT ANKLE ARTHRODESIS ;  Surgeon: Toni Arthurs, MD;  Location: [facility];  Service: Orthopedics;  Laterality: Left;   ANKLE SURGERY Left    arm surgery Left    radius   bladder tacked      BREAST LUMPECTOMY Left 10/2015   CERVICAL FUSION     CHOLECYSTECTOMY     COLONOSCOPY     CYSTOCELE REPAIR     DILATION AND CURETTAGE OF UTERUS     ESOPHAGOGASTRODUODENOSCOPY     PORT A CATH REVISION N/A 12/27/2015   Procedure: PORT A CATH REVISION;  Surgeon: Emelia Loron, MD;  Location: [facility];  Service: General;  Laterality: N/A;   PORT-A-CATH REMOVAL N/A 03/25/2016   Procedure: REMOVAL PORT-A-CATH;  Surgeon: Emelia Loron, MD;  Location: [facility];  Service: General;  Laterality: N/A;   PORTACATH PLACEMENT Right 12/27/2015   Procedure: INSERTION PORT-A-CATH WITH ULTRASOUND ;  Surgeon: Emelia Loron, MD;  Location: [facility];  Service: General;  Laterality: Right;   RADIOACTIVE SEED GUIDED PARTIAL MASTECTOMY WITH AXILLARY SENTINEL LYMPH NODE BIOPSY Left 11/14/2015   Procedure: RADIOACTIVE SEED GUIDED LEFT BREAST LUMPECTOMY WITH AXILLARY SENTINEL LYMPH NODE BIOPSY;  Surgeon: Emelia Loron, MD;  Location: [facility];  Service:  General;  Laterality: Left;  RADIOACTIVE SEED GUIDED LEFT BREAST LUMPECTOMY WITH AXILLARY SENTINEL LYMPH NODE BIOPSY   SALPINGOOPHORECTOMY Bilateral    TOTAL KNEE ARTHROPLASTY Right 02/28/2015   Procedure: RIGHT TOTAL KNEE ARTHROPLASTY;  Surgeon: Samson Frederic, MD;  Location: [facility];  Service: Orthopedics;  Laterality: Right;   UPPER GASTROINTESTINAL ENDOSCOPY     VAGINAL HYSTERECTOMY      I have reviewed the social history and family history with the patient and they are unchanged from previous note.  ALLERGIES:  is allergic to codeine, other, and aspirin.  MEDICATIONS:  Current Outpatient Medications  Medication Sig Dispense Refill   amLODipine (NORVASC) 10 MG tablet Take 10 mg by mouth daily.     cholecalciferol (VITAMIN D3) 25 MCG (1000 UNIT) tablet Take 1,000 Units by mouth daily.     diclofenac Sodium (VOLTAREN) 1 % GEL SMARTSIG:3 Inch(es) Topical 3 Times Daily PRN     erythromycin ophthalmic ointment Place 1 Application into the left eye 3 (three) times daily.     folic acid (FOLVITE) 1 MG tablet Take 2 mg by mouth every morning.     hydroxychloroquine (PLAQUENIL) 200 MG tablet Take 200 mg by mouth daily.     LIDOCAINE PAIN RELIEF 4 % PTCH Apply 1 patch topically 2 (two) times daily.     lovastatin (MEVACOR) 40 MG tablet 1 (ONE) TABLET TAKE IN THE EVENING AFTER DINNER (Patient taking differently: Take 40 mg by mouth every evening.) 90 tablet 1   metoprolol succinate (TOPROL-XL) 50 MG 24 hr tablet Take 50 mg by mouth every evening.   2   omeprazole (PRILOSEC) 40 MG capsule Take 40 mg by mouth daily.     oxyCODONE (OXY IR/ROXICODONE) 5 MG immediate release tablet Take 5 mg by mouth every 4 (four) hours as needed.     polyethylene glycol (MIRALAX / GLYCOLAX) 17 g packet Take 17 g by mouth daily.     predniSONE (DELTASONE) 5 MG tablet Take 7.5 mg by mouth daily.     pregabalin (LYRICA) 75 MG capsule Take 75-150 mg by mouth See admin instructions. 75 Mg in the Am and 150 MG at bedtime      valsartan-hydrochlorothiazide (DIOVAN-HCT) 320-12.5 MG tablet Take 0.5 tablets by mouth daily.     No current facility-administered medications for this visit.    PHYSICAL EXAMINATION: ECOG PERFORMANCE STATUS: 2 - Symptomatic, <50% confined to bed  Vitals:   12/31/22 1454  BP: (!) 156/83  Pulse: (!) 55  Resp: 18  Temp: 98.3 F (36.8 C)  SpO2: 95%    Wt Readings from Last 3 Encounters:  12/31/22 208 lb 11.2 oz (94.7 kg)  07/29/22 208 lb (94.3 kg)  12/30/21 208 lb 4.8 oz (94.5 kg)     GENERAL:alert, no distress and comfortable SKIN: skin color, texture, turgor are normal, no rashes or significant lesions EYES: normal, Conjunctiva are pink and non-injected, sclera clear  NECK: supple, thyroid normal size, non-tender, without nodularity LYMPH:  no palpable lymphadenopathy in the cervical, axillary LUNGS: clear to auscultation and percussion with normal breathing effort HEART: regular rate & rhythm and no murmurs and no lower extremity edema ABDOMEN:abdomen soft, non-tender and normal bowel sounds Musculoskeletal:no cyanosis of digits and no clubbing  NEURO: alert & oriented x 3 with fluent speech, no focal motor/sensory deficits BREAST: (+) a pea size of firm nodule in the upper outer quadrant of left breast, which has been stable, no other palpable mass, nodules or adenopathy bilaterally. Breast exam benign.   LABORATORY DATA:  I have reviewed the data as listed    Latest Ref Rng & Units 12/31/2022    2:20 PM 12/30/2021   11:37 AM 06/27/2021    8:10 PM  CBC  WBC 4.0 - 10.5 K/uL 4.6  6.3  6.5   Hemoglobin 12.0 - 15.0 g/dL 40.9  81.1  91.4   Hematocrit 36.0 - 46.0 % 34.9  36.6  40.1   Platelets 150 - 400 K/uL 192  200  226         Latest Ref Rng & Units 12/31/2022    2:20  PM 12/30/2021   11:37 AM 06/27/2021    8:10 PM  CMP  Glucose 70 - 99 mg/dL 324  401  027   BUN 8 - 23 mg/dL 22  30  26    Creatinine 0.44 - 1.00 mg/dL 2.53  6.64  4.03   Sodium 135 - 145 mmol/L 143  141   139   Potassium 3.5 - 5.1 mmol/L 4.1  3.7  3.6   Chloride 98 - 111 mmol/L 107  104  101   CO2 22 - 32 mmol/L 31  32  30   Calcium 8.9 - 10.3 mg/dL 8.8  9.1  9.2   Total Protein 6.5 - 8.1 g/dL 7.0  7.1  7.0   Total Bilirubin 0.3 - 1.2 mg/dL 0.6  0.7  0.7   Alkaline Phos 38 - 126 U/L 94  72  57   AST 15 - 41 U/L 32  22  21   ALT 0 - 44 U/L 19  17  17        RADIOGRAPHIC STUDIES: I have personally reviewed the radiological images as listed and agreed with the findings in the report. No results found.    No orders of the defined types were placed in this encounter.  All questions were answered. The patient knows to call the clinic with any problems, questions or concerns. No barriers to learning was detected. The total time spent in the appointment was 25 minutes.     Malachy Mood, MD 12/31/2022

## 2023-06-17 ENCOUNTER — Ambulatory Visit: Payer: Medicare Other | Admitting: Podiatry

## 2023-06-28 ENCOUNTER — Ambulatory Visit (INDEPENDENT_AMBULATORY_CARE_PROVIDER_SITE_OTHER): Payer: Medicare Other | Admitting: Podiatry

## 2023-06-28 ENCOUNTER — Encounter: Payer: Self-pay | Admitting: Podiatry

## 2023-06-28 DIAGNOSIS — L609 Nail disorder, unspecified: Secondary | ICD-10-CM | POA: Diagnosis not present

## 2023-06-28 DIAGNOSIS — L608 Other nail disorders: Secondary | ICD-10-CM | POA: Diagnosis not present

## 2023-06-28 NOTE — Progress Notes (Unsigned)
Right hallux toenail getting thick from becoming a pincer nail.  Sides do not have any pain.  It is of the thickness of the nail with direct pressure on top.  The nail is Dremel thin today and we will have her use.  Nail 47% urea to the nail daily.  Follow-up as needed.  We did discuss the possibility of border P&A's as well as entire nail removal in the future.

## 2023-09-01 ENCOUNTER — Other Ambulatory Visit: Payer: Self-pay | Admitting: Family Medicine

## 2023-09-01 DIAGNOSIS — N63 Unspecified lump in unspecified breast: Secondary | ICD-10-CM

## 2023-09-14 ENCOUNTER — Ambulatory Visit
Admission: RE | Admit: 2023-09-14 | Discharge: 2023-09-14 | Disposition: A | Discharge status: Home / Self Care | Source: Ambulatory Visit | Attending: Family Medicine | Admitting: Family Medicine

## 2023-09-14 DIAGNOSIS — N63 Unspecified lump in unspecified breast: Secondary | ICD-10-CM

## 2023-11-17 ENCOUNTER — Other Ambulatory Visit: Payer: Self-pay | Admitting: Family Medicine

## 2023-11-17 DIAGNOSIS — Z1231 Encounter for screening mammogram for malignant neoplasm of breast: Secondary | ICD-10-CM

## 2023-12-14 ENCOUNTER — Ambulatory Visit

## 2024-01-03 ENCOUNTER — Other Ambulatory Visit: Payer: Self-pay

## 2024-01-03 ENCOUNTER — Inpatient Hospital Stay (HOSPITAL_BASED_OUTPATIENT_CLINIC_OR_DEPARTMENT_OTHER): Payer: Medicare Other | Admitting: Hematology

## 2024-01-03 ENCOUNTER — Inpatient Hospital Stay: Payer: Medicare Other | Attending: Hematology

## 2024-01-03 VITALS — BP 160/70 | HR 64 | Temp 97.6°F | Resp 18 | Wt 188.6 lb

## 2024-01-03 DIAGNOSIS — Z1501 Genetic susceptibility to malignant neoplasm of breast: Secondary | ICD-10-CM

## 2024-01-03 DIAGNOSIS — M19041 Primary osteoarthritis, right hand: Secondary | ICD-10-CM | POA: Insufficient documentation

## 2024-01-03 DIAGNOSIS — D472 Monoclonal gammopathy: Secondary | ICD-10-CM

## 2024-01-03 DIAGNOSIS — Z853 Personal history of malignant neoplasm of breast: Secondary | ICD-10-CM | POA: Diagnosis not present

## 2024-01-03 DIAGNOSIS — C50411 Malignant neoplasm of upper-outer quadrant of right female breast: Secondary | ICD-10-CM | POA: Diagnosis not present

## 2024-01-03 DIAGNOSIS — Z171 Estrogen receptor negative status [ER-]: Secondary | ICD-10-CM

## 2024-01-03 DIAGNOSIS — Z1231 Encounter for screening mammogram for malignant neoplasm of breast: Secondary | ICD-10-CM

## 2024-01-03 DIAGNOSIS — Z08 Encounter for follow-up examination after completed treatment for malignant neoplasm: Secondary | ICD-10-CM | POA: Insufficient documentation

## 2024-01-03 DIAGNOSIS — M19042 Primary osteoarthritis, left hand: Secondary | ICD-10-CM | POA: Insufficient documentation

## 2024-01-03 DIAGNOSIS — Z1509 Genetic susceptibility to other malignant neoplasm: Secondary | ICD-10-CM

## 2024-01-03 LAB — CBC WITH DIFFERENTIAL (CANCER CENTER ONLY)
Abs Immature Granulocytes: 0.01 K/uL (ref 0.00–0.07)
Basophils Absolute: 0 K/uL (ref 0.0–0.1)
Basophils Relative: 1 %
Eosinophils Absolute: 0 K/uL (ref 0.0–0.5)
Eosinophils Relative: 0 %
HCT: 37 % (ref 36.0–46.0)
Hemoglobin: 12.1 g/dL (ref 12.0–15.0)
Immature Granulocytes: 0 %
Lymphocytes Relative: 17 %
Lymphs Abs: 0.9 K/uL (ref 0.7–4.0)
MCH: 27.3 pg (ref 26.0–34.0)
MCHC: 32.7 g/dL (ref 30.0–36.0)
MCV: 83.5 fL (ref 80.0–100.0)
Monocytes Absolute: 0.4 K/uL (ref 0.1–1.0)
Monocytes Relative: 7 %
Neutro Abs: 4 K/uL (ref 1.7–7.7)
Neutrophils Relative %: 75 %
Platelet Count: 190 K/uL (ref 150–400)
RBC: 4.43 MIL/uL (ref 3.87–5.11)
RDW: 16.6 % — ABNORMAL HIGH (ref 11.5–15.5)
WBC Count: 5.3 K/uL (ref 4.0–10.5)
nRBC: 0 % (ref 0.0–0.2)

## 2024-01-03 LAB — CMP (CANCER CENTER ONLY)
ALT: 14 U/L (ref 0–44)
AST: 24 U/L (ref 15–41)
Albumin: 4.1 g/dL (ref 3.5–5.0)
Alkaline Phosphatase: 67 U/L (ref 38–126)
Anion gap: 6 (ref 5–15)
BUN: 29 mg/dL — ABNORMAL HIGH (ref 8–23)
CO2: 30 mmol/L (ref 22–32)
Calcium: 9.3 mg/dL (ref 8.9–10.3)
Chloride: 107 mmol/L (ref 98–111)
Creatinine: 1.06 mg/dL — ABNORMAL HIGH (ref 0.44–1.00)
GFR, Estimated: 50 mL/min — ABNORMAL LOW (ref >60)
Glucose, Bld: 102 mg/dL — ABNORMAL HIGH (ref 70–99)
Potassium: 3.9 mmol/L (ref 3.5–5.1)
Sodium: 143 mmol/L (ref 135–145)
Total Bilirubin: 0.7 mg/dL (ref 0.0–1.2)
Total Protein: 7 g/dL (ref 6.5–8.1)

## 2024-01-03 NOTE — Assessment & Plan Note (Signed)
-  She has a history of breast cancer in her mother and maternal aunt -She had genetic testing on 08/29/20, results revealed pathogenic variant in BRCA2. -All her 3 daughters have been tested positive and evaluated by their physicians.

## 2024-01-03 NOTE — Progress Notes (Signed)
 [facility]   [phone] Fax:([phone]) [phone]   Clinic Follow up Note   Patient Care Team: Gerome Brunet, DO as PCP - General (Family Medicine) Leni Marjory MATSU, MD as Referring Physician (Rheumatology) Ebbie Cough, MD as Consulting Physician (General Surgery) Lanny Callander, MD as Consulting Physician (Hematology) Crawford Morna Pickle, NP as Nurse Practitioner (Hematology and Oncology)  Date of Service:  01/03/2024  CHIEF COMPLAINT: f/u of breast cancer and MGUS   CURRENT THERAPY:  Surveillance   Oncology History   Malignant neoplasm of upper outer quadrant of female breast (HCC) invasive ductal carcinoma, grade 3, pT1cN0M0, stage IA, triple negative -diagnosed in 09/2015. S/p left breast lumpectomy, adjuvant Taxol /Abraxane  and adjuvant radiation. She is currently on Surveillance.   BRCA2 gene mutation positive -She has a history of breast cancer in her mother and maternal aunt -She had genetic testing on 08/29/20, results revealed pathogenic variant in BRCA2. -All her 3 daughters have been tested positive and evaluated by their physicians.  MGUS (monoclonal gammopathy of unknown significance) -Her M-protein has been around 0.8-0.9 since 2017  -No clinical concern for disease progression. Will continue follow up SPE/IFE, and light chain levels every 6 months.  Assessment & Plan Breast cancer, status post surgery, under surveillance Breast cancer diagnosed in 2017, currently under surveillance. Recent surgery for a suspected calcium  deposit under the skin; awaiting pathology results. No new breast issues, with a small scar tissue from previous surgery. Last mammogram in April 2025, with an ultrasound performed due to recovery issues. - Continue annual follow-up for breast cancer surveillance. - Order screening mammogram as soon as possible. - Continue breast MRI in addition to mammogram every six months.  Monoclonal gammopathy of undetermined  significance (MGUS) MGUS with well-managed protein levels over the past year. Recent lab work done today, awaiting results. Previous mild anemia resolved, current blood counts normal. Improved kidney function compared to last year. - Await lab results for MGUS. - Monitor blood counts and kidney function.  Primary osteoarthritis of the hands Reports joint pain and fingers occasionally sticking together, possibly due to muscle spasms. Joint deformity noted, consistent with osteoarthritis.  BRCA2 gene mutation carrier BRCA2 gene mutation carrier with a family history of breast cancer. Underwent total hysterectomy and double mastectomy. Family members notified and tested. - Continue regular surveillance with breast MRI and mammogram every six months.  Plan -she is doing well overall  -will call her with MGUS lab results  -lab and f/u in one year, she will see her PCP in the interim   SUMMARY OF ONCOLOGIC HISTORY: Oncology History Overview Note  Malignant neoplasm of upper outer quadrant of female breast [facility])   Staging form: Breast, AJCC 7th Edition     Pathologic stage from 11/14/2015: Stage IA (T1c, N0, cM0) - Signed by Callander Lanny, MD on 11/25/2015     Clinical stage from 11/20/2015: Stage IA (T1c, N0, M0) - Signed by Sherrod Sherrod, MD on 11/20/2015     Malignant neoplasm of upper outer quadrant of female breast (HCC)  10/09/2015 Mammogram   Screening bilateral mammograms showed a possible left breast mass. Diagnostic mammogram and ultrasound showed a 7 x 7 x 7 mm mass in the left breast at 11:00 position.   10/22/2015 Receptors her2   ER negative, PR negative, HER-2 not amplified   10/22/2015 Initial Biopsy   Left breast needle core biopsy at the 1:00 position showed invasive ductal carcinoma.   10/22/2015 Initial Diagnosis   Malignant neoplasm of upper outer  quadrant of female breast (HCC)   11/14/2015 Surgery   Left breast lumpectomy and sentinel lymph node biopsy [name])    11/14/2015 Pathology Results   Left breast lumpectomy showed invasive ductal carcinoma, grade 3, 1.1 cm, resection margins were negative, sentinel lymph node biopsy showed benign breast parenchyma, lymph node tissue not identified. Repeated ER PR and HER-2 were all negative.   12/20/2015 - 03/13/2016 Chemotherapy   Weekly Taxol  80 mg/m x 2 cycles, then changed to Abraxane  100 mg/m for cycles 3-12 due to infusion reaction. Abraxane  dose-reduced cycle #9, then again for cycles #11 & #12 due to neuropathy. [total 12 cycles chemo]    04/01/2016 - 05/11/2016 Radiation Therapy   Adjuvant breast radiation ([facility]). Left breast: 50.4 Gy in 28 fractions   06/19/2016 Imaging   MR Lumbar Spine without contrast  IMPRESSION: 1. Diffuse lumbar spondylosis with discogenic and facet degenerative changes progressed from 2010 lumbar MRI. 2. Multilevel canal stenosis is mild-to-moderate at L2-3, moderate L3-4, and mild at L4-5. 3. Multiple levels of mild and moderate neural foraminal narrowing with severe foraminal narrowing at the right L3-4 and L4-5 levels.   11/12/2017 Imaging   11/12/2017 DEXA ASSESSMENT: The BMD measured at Forearm Radius 33% is 0.648 g/cm2 with a T-score of -2.7. This patient is considered osteoporotic according to World Health Organization [facility]) criteria.   11/12/2017 Mammogram   11/12/2017 Diagnostic Mammogram IMPRESSION: No mammographic evidence of malignancy in either breast, status post left lumpectomy.   09/16/2020 Genetic Testing   Pathogenic variant in BRCA2 detected at c.4284dup (p.Gln1429Serfs*9) through [facility] Multi-Cancer +RNA Panel.  Variant of uncertain significance detected in MSH3 at c.2600T>C (p.Ile867Thr).  The report date is September 16, 2020.  The Multi-Cancer + RNA Panel offered by [facility] includes sequencing and/or deletion/duplication analysis of the following 84 genes:  AIP*, ALK, APC*, ATM*, AXIN2*, BAP1*, BARD1*, BLM*, BMPR1A*, BRCA1*, BRCA2*, BRIP1*, CASR,  CDC73*, CDH1*, CDK4, CDKN1B*, CDKN1C*, CDKN2A, CEBPA, CHEK2*, CTNNA1*, DICER1*, DIS3L2*, EGFR, EPCAM, FH*, FLCN*, GATA2*, GPC3, GREM1, HOXB13, HRAS, KIT, MAX*, MEN1*, MET, MITF, MLH1*, MSH2*, MSH3*, MSH6*, MUTYH*, NBN*, NF1*, NF2*, NTHL1*, PALB2*, PDGFRA, PHOX2B, PMS2*, POLD1*, POLE*, POT1*, PRKAR1A*, PTCH1*, PTEN*, RAD50*, RAD51C*, RAD51D*, RB1*, RECQL4, RET, RUNX1*, SDHA*, SDHAF2*, SDHB*, SDHC*, SDHD*, SMAD4*, SMARCA4*, SMARCB1*, SMARCE1*, STK11*, SUFU*, TERC, TERT, TMEM127*, Tp53*, TSC1*, TSC2*, VHL*, WRN*, and WT1.  RNA analysis is performed for * genes.      Discussed the use of AI scribe software for clinical note transcription with the patient, who gave verbal consent to proceed.  History of Present Illness Julie [name] is an 89 year old female with breast cancer and MGUS who presents for follow-up.  She experiences joint pain and stiffness in her fingers, which began a few days ago. She wears a brace for support but did not have it on during the visit.  She underwent surgery for breast cancer and has a lump in her breast, believed to be calcium  from the surgery. A nodule under the skin was recently removed and sent for lab analysis, with results pending. Her last mammogram in April 2025 was benign, and she has not had a mammogram this year. An ultrasound was performed due to previous findings.  She has MGUS and underwent lab work today, with results pending. She had mild anemia a few years ago.  She has a family history of BRCA2 mutation, with both her and her sister being positive. She has had a total hysterectomy, and her sister has had a hysterectomy and double mastectomy. Family  members have been informed of the genetic mutation. No new breast discomfort or back pain.     All other systems were reviewed with the patient and are negative.  MEDICAL HISTORY:  Past Medical History:  Diagnosis Date   Anemia    Anxiety    takes Klonopin  at bedtime   Arthritis    takes  Methotrexate weekly and Prednisone    Blood transfusion    no abnormal reaction noted   BRCA2 gene mutation positive 09/16/2020   Cancer (HCC)    breast    Cataracts, bilateral    immature   Complication of anesthesia    wakes up during surgery   Depression    Diabetes mellitus without complication (HCC)    Dyspnea    with exertion since chemo    Family history of colon cancer 08/30/2020   Family history of kidney cancer 08/30/2020   Family history of prostate cancer 08/30/2020   Fibromyalgia    takes Lyrica  daily   GERD (gastroesophageal reflux disease)    takes Nexium daily   Glaucoma    Hard of hearing    Heart murmur    Histoplasmosis    History of colon polyps    History of gout    History of hiatal hernia    History of shingles    Hyperlipidemia    Hypertension    takes Amlodipine ,Metoprolol ,and Losartan  daily   Incontinence of bowel    Interstitial cystitis    Joint pain    Low back pain    Nocturia    Peripheral neuropathy    Peripheral neuropathy    Personal history of chemotherapy    Personal history of radiation therapy    PONV (postoperative nausea and vomiting)    Ulcer 1970   duodenal   Urinary frequency    Urinary urgency    Weakness    numbness in both hands and feet    SURGICAL HISTORY: Past Surgical History:  Procedure Laterality Date   ABDOMINAL HYSTERECTOMY     accupuncture  3 yrs ago   ANKLE FUSION Left 05/10/2014   Procedure: LEFT ANKLE ARTHRODESIS ;  Surgeon: Norleen Armor, MD;  Location: [facility];  Service: Orthopedics;  Laterality: Left;   ANKLE SURGERY Left    arm surgery Left    radius   bladder tacked      BREAST LUMPECTOMY Left 10/2015   CERVICAL FUSION     CHOLECYSTECTOMY     COLONOSCOPY     CYSTOCELE REPAIR     DILATION AND CURETTAGE OF UTERUS     ESOPHAGOGASTRODUODENOSCOPY     PORT A CATH REVISION N/A 12/27/2015   Procedure: PORT A CATH REVISION;  Surgeon: Donnice Bury, MD;  Location: [facility];  Service:  General;  Laterality: N/A;   PORT-A-CATH REMOVAL N/A 03/25/2016   Procedure: REMOVAL PORT-A-CATH;  Surgeon: Donnice Bury, MD;  Location: [facility];  Service: General;  Laterality: N/A;   PORTACATH PLACEMENT Right 12/27/2015   Procedure: INSERTION PORT-A-CATH WITH ULTRASOUND ;  Surgeon: Donnice Bury, MD;  Location: [facility];  Service: General;  Laterality: Right;   RADIOACTIVE SEED GUIDED PARTIAL MASTECTOMY WITH AXILLARY SENTINEL LYMPH NODE BIOPSY Left 11/14/2015   Procedure: RADIOACTIVE SEED GUIDED LEFT BREAST LUMPECTOMY WITH AXILLARY SENTINEL LYMPH NODE BIOPSY;  Surgeon: Donnice Bury, MD;  Location: [facility];  Service: General;  Laterality: Left;  RADIOACTIVE SEED GUIDED LEFT BREAST LUMPECTOMY WITH AXILLARY SENTINEL LYMPH NODE BIOPSY   SALPINGOOPHORECTOMY Bilateral  TOTAL KNEE ARTHROPLASTY Right 02/28/2015   Procedure: RIGHT TOTAL KNEE ARTHROPLASTY;  Surgeon: Redell Shoals, MD;  Location: [facility];  Service: Orthopedics;  Laterality: Right;   UPPER GASTROINTESTINAL ENDOSCOPY     VAGINAL HYSTERECTOMY      I have reviewed the social history and family history with the patient and they are unchanged from previous note.  ALLERGIES:  is allergic to codeine , walnut, other, and aspirin.  MEDICATIONS:  Current Outpatient Medications  Medication Sig Dispense Refill   amLODipine  (NORVASC ) 10 MG tablet Take 10 mg by mouth daily.     busPIRone (BUSPAR) 5 MG tablet Take 5 mg by mouth every morning.     cholecalciferol (VITAMIN D3) 25 MCG (1000 UNIT) tablet Take 1,000 Units by mouth daily.     diclofenac Sodium (VOLTAREN) 1 % GEL SMARTSIG:3 Inch(es) Topical 3 Times Daily PRN     LIDOCAINE  PAIN RELIEF 4 % PTCH Apply 1 patch topically 2 (two) times daily.     lovastatin (MEVACOR) 40 MG tablet 1 (ONE) TABLET TAKE IN THE EVENING AFTER DINNER (Patient taking differently: Take 40 mg by mouth every evening.) 90 tablet 1   metoprolol  succinate (TOPROL -XL) 50 MG 24 hr  tablet Take 50 mg by mouth every evening.   2   omeprazole (PRILOSEC) 40 MG capsule Take 40 mg by mouth daily.     oxyCODONE  (OXY IR/ROXICODONE ) 5 MG immediate release tablet Take 5 mg by mouth every 4 (four) hours as needed.     polyethylene glycol (MIRALAX  / GLYCOLAX ) 17 g packet Take 17 g by mouth daily.     predniSONE  (DELTASONE ) 5 MG tablet Take 7.5 mg by mouth daily.     pregabalin  (LYRICA ) 75 MG capsule Take 75-150 mg by mouth See admin instructions. 75 Mg in the Am and 150 MG at bedtime     Prenatal MV-Min-Fe Fum-FA-DHA (PRENATAL 1 PO) Take by mouth.     valsartan -hydrochlorothiazide  (DIOVAN -HCT) 320-12.5 MG tablet Take 0.5 tablets by mouth daily.     No current facility-administered medications for this visit.    PHYSICAL EXAMINATION: ECOG PERFORMANCE STATUS: 1 - Symptomatic but completely ambulatory  Vitals:   01/03/24 1455 01/03/24 1458  BP: (!) 182/77 (!) 160/70  Pulse: 64   Resp: 18   Temp: 97.6 F (36.4 C)   SpO2: 100%    Wt Readings from Last 3 Encounters:  01/03/24 188 lb 9.6 oz (85.5 kg)  12/31/22 208 lb 11.2 oz (94.7 kg)  07/29/22 208 lb (94.3 kg)     GENERAL:alert, no distress and comfortable SKIN: skin color, texture, turgor are normal, no rashes or significant lesions EYES: normal, Conjunctiva are pink and non-injected, sclera clear NECK: supple, thyroid  normal size, non-tender, without nodularity LYMPH:  no palpable lymphadenopathy in the cervical, axillary  LUNGS: clear to auscultation and percussion with normal breathing effort HEART: regular rate & rhythm and no murmurs and no lower extremity edema ABDOMEN:abdomen soft, non-tender and normal bowel sounds Musculoskeletal:no cyanosis of digits and no clubbing  NEURO: alert & oriented x 3 with fluent speech, no focal motor/sensory deficits BREAST: There is a 1 cm firm nodule in the upper inner quadrant of left breast, no tenderness, likely surgical scar, no other palpable breast mass or  adenopathy Physical Exam    LABORATORY DATA:  I have reviewed the data as listed    Latest Ref Rng & Units 01/03/2024    2:22 PM 12/31/2022    2:20 PM 12/30/2021   11:37 AM  CBC  WBC 4.0 - 10.5 K/uL 5.3  4.6  6.3   Hemoglobin 12.0 - 15.0 g/dL 87.8  88.8  88.1   Hematocrit 36.0 - 46.0 % 37.0  34.9  36.6   Platelets 150 - 400 K/uL 190  192  200         Latest Ref Rng & Units 01/03/2024    2:22 PM 12/31/2022    2:20 PM 12/30/2021   11:37 AM  CMP  Glucose 70 - 99 mg/dL 897  889  883   BUN 8 - 23 mg/dL 29  22  30    Creatinine 0.44 - 1.00 mg/dL 8.93  8.98  8.77   Sodium 135 - 145 mmol/L 143  143  141   Potassium 3.5 - 5.1 mmol/L 3.9  4.1  3.7   Chloride 98 - 111 mmol/L 107  107  104   CO2 22 - 32 mmol/L 30  31  32   Calcium  8.9 - 10.3 mg/dL 9.3  8.8  9.1   Total Protein 6.5 - 8.1 g/dL 7.0  7.0  7.1   Total Bilirubin 0.0 - 1.2 mg/dL 0.7  0.6  0.7   Alkaline Phos 38 - 126 U/L 67  94  72   AST 15 - 41 U/L 24  32  22   ALT 0 - 44 U/L 14  19  17        RADIOGRAPHIC STUDIES: I have personally reviewed the radiological images as listed and agreed with the findings in the report. No results found.    No orders of the defined types were placed in this encounter.  All questions were answered. The patient knows to call the clinic with any problems, questions or concerns. No barriers to learning was detected. The total time spent in the appointment was 20 minutes, including review of chart and various tests results, discussions about plan of care and coordination of care plan     Onita Mattock, MD 01/03/2024

## 2024-01-03 NOTE — Assessment & Plan Note (Signed)
-  Her M-protein has been around 0.8-0.9 since 2017  -No clinical concern for disease progression. Will continue follow up SPE/IFE, and light chain levels every 6 months.

## 2024-01-03 NOTE — Assessment & Plan Note (Signed)
 invasive ductal carcinoma, grade 3, pT1cN0M0, stage IA, triple negative -diagnosed in 09/2015. S/p left breast lumpectomy, adjuvant Taxol /Abraxane  and adjuvant radiation. She is currently on Surveillance.

## 2024-01-04 LAB — KAPPA/LAMBDA LIGHT CHAINS
Kappa free light chain: 56.3 mg/L — ABNORMAL HIGH (ref 3.3–19.4)
Kappa, lambda light chain ratio: 4.17 — ABNORMAL HIGH (ref 0.26–1.65)
Lambda free light chains: 13.5 mg/L (ref 5.7–26.3)

## 2024-01-10 LAB — MULTIPLE MYELOMA PANEL, SERUM
Albumin SerPl Elph-Mcnc: 3.6 g/dL (ref 2.9–4.4)
Albumin/Glob SerPl: 1.3 (ref 0.7–1.7)
Alpha 1: 0.2 g/dL (ref 0.0–0.4)
Alpha2 Glob SerPl Elph-Mcnc: 0.6 g/dL (ref 0.4–1.0)
B-Globulin SerPl Elph-Mcnc: 1.5 g/dL — ABNORMAL HIGH (ref 0.7–1.3)
Gamma Glob SerPl Elph-Mcnc: 0.8 g/dL (ref 0.4–1.8)
Globulin, Total: 3 g/dL (ref 2.2–3.9)
IgA: 1045 mg/dL — ABNORMAL HIGH (ref 64–422)
IgG (Immunoglobin G), Serum: 847 mg/dL (ref 586–1602)
IgM (Immunoglobulin M), Srm: 50 mg/dL (ref 26–217)
M Protein SerPl Elph-Mcnc: 0.8 g/dL — ABNORMAL HIGH
Total Protein ELP: 6.6 g/dL (ref 6.0–8.5)

## 2024-01-13 ENCOUNTER — Ambulatory Visit: Payer: Self-pay | Admitting: Hematology

## 2024-01-13 NOTE — Telephone Encounter (Addendum)
 Called patient to relay lab result below as per Dr. Lanny, spoke with patients daughter, she voiced full understanding and had no further questions at this time.    ----- Message from Onita Lanny sent at 01/13/2024  8:39 AM EDT ----- Please let pt know her MGUS is stable, no concerns, thx   Onita Lanny  ----- Message ----- From: Rebecka, Lab In [place] Sent: 01/03/2024   2:37 PM EDT To: Onita Lanny, MD

## 2024-01-21 ENCOUNTER — Ambulatory Visit
Admission: RE | Admit: 2024-01-21 | Discharge: 2024-01-21 | Disposition: A | Discharge status: Home / Self Care | Source: Ambulatory Visit | Attending: Family Medicine | Admitting: Family Medicine

## 2024-01-21 DIAGNOSIS — Z1231 Encounter for screening mammogram for malignant neoplasm of breast: Secondary | ICD-10-CM

## 2024-04-15 ENCOUNTER — Emergency Department (HOSPITAL_COMMUNITY)
Admission: EM | Admit: 2024-04-15 | Discharge: 2024-04-15 | Disposition: A | Discharge status: Home / Self Care | Attending: Emergency Medicine | Admitting: Emergency Medicine

## 2024-04-15 ENCOUNTER — Emergency Department (HOSPITAL_COMMUNITY)

## 2024-04-15 ENCOUNTER — Encounter (HOSPITAL_COMMUNITY): Payer: Self-pay

## 2024-04-15 DIAGNOSIS — Z79899 Other long term (current) drug therapy: Secondary | ICD-10-CM | POA: Insufficient documentation

## 2024-04-15 DIAGNOSIS — X509XXA Other and unspecified overexertion or strenuous movements or postures, initial encounter: Secondary | ICD-10-CM | POA: Insufficient documentation

## 2024-04-15 DIAGNOSIS — S8261XA Displaced fracture of lateral malleolus of right fibula, initial encounter for closed fracture: Secondary | ICD-10-CM | POA: Diagnosis not present

## 2024-04-15 DIAGNOSIS — S82891A Other fracture of right lower leg, initial encounter for closed fracture: Secondary | ICD-10-CM

## 2024-04-15 DIAGNOSIS — S99911A Unspecified injury of right ankle, initial encounter: Secondary | ICD-10-CM | POA: Diagnosis present

## 2024-04-15 MED ORDER — OXYCODONE-ACETAMINOPHEN 5-325 MG PO TABS
1.0000 | ORAL_TABLET | Freq: Once | ORAL | Status: AC
  Administered 2024-04-15: 1 via ORAL
  Filled 2024-04-15: qty 1

## 2024-04-15 NOTE — ED Notes (Signed)
 Cam boot applied to patient

## 2024-04-15 NOTE — ED Provider Notes (Signed)
 [facility] AT [facility] Provider Note   CSN: [id number] Arrival date & time: 04/15/24  1933     Patient presents with: Fall and Ankle Pain   Julie [name] is a 89 y.o. female.   Fall  Ankle Pain Patient is an 89 year old female presenting today for concerns for right ankle pain after she got up from sleeping, with her legs crossed, noting that her leg fell asleep for when she stood up her leg was asleep and she twisted her right ankle where she then had pain.  Falling to the ground.  Did not hit head, did not lose consciousness, and was able to hobble on left foot but was not able to walk on her right foot.   Denies numbness, weakness, tingling.  Prior to Admission medications   Medication Sig Start Date End Date Taking? Authorizing Provider  amLODipine  (NORVASC ) 10 MG tablet Take 10 mg by mouth daily. 09/27/19   [provider]  busPIRone (BUSPAR) 5 MG tablet Take 5 mg by mouth every morning. 05/06/23   [provider]  cholecalciferol (VITAMIN D3) 25 MCG (1000 UNIT) tablet Take 1,000 Units by mouth daily.    [provider]  diclofenac Sodium (VOLTAREN) 1 % GEL SMARTSIG:3 Inch(es) Topical 3 Times Daily PRN 11/08/19   [provider]  LIDOCAINE  PAIN RELIEF 4 % PTCH Apply 1 patch topically 2 (two) times daily. 11/21/19   [provider]  lovastatin (MEVACOR) 40 MG tablet 1 (ONE) TABLET TAKE IN THE EVENING AFTER DINNER Patient taking differently: Take 40 mg by mouth every evening. 08/12/18   Vyas, Chandra K, MD  metoprolol  succinate (TOPROL -XL) 50 MG 24 hr tablet Take 50 mg by mouth every evening.  08/29/15   [provider]  omeprazole (PRILOSEC) 40 MG capsule Take 40 mg by mouth daily. 11/07/19   [provider]  oxyCODONE  (OXY IR/ROXICODONE ) 5 MG immediate release tablet Take 5 mg by mouth every 4 (four) hours as needed. 12/01/19   [provider]  polyethylene glycol (MIRALAX  /  GLYCOLAX ) 17 g packet Take 17 g by mouth daily.    [provider]  predniSONE  (DELTASONE ) 5 MG tablet Take 7.5 mg by mouth daily. 11/06/19   [provider]  pregabalin  (LYRICA ) 75 MG capsule Take 75-150 mg by mouth See admin instructions. 75 Mg in the Am and 150 MG at bedtime    [provider]  Prenatal MV-Min-Fe Fum-FA-DHA (PRENATAL 1 PO) Take by mouth.    [provider]  valsartan -hydrochlorothiazide  (DIOVAN -HCT) 320-12.5 MG tablet Take 0.5 tablets by mouth daily. 08/05/19   [provider]    Allergies: Codeine , Walnut, Other, and Aspirin    Review of Systems  Musculoskeletal:  Positive for arthralgias.  All other systems reviewed and are negative.   Updated Vital Signs BP (!) 171/84 (BP Location: Right Arm)   Pulse 60   Temp 98.4 F (36.9 C) (Oral)   Resp 18   SpO2 97%   Physical Exam Vitals and nursing note reviewed.  Constitutional:      General: She is not in acute distress.    Appearance: Normal appearance. She is not ill-appearing or diaphoretic.  HENT:     Head: Normocephalic and atraumatic.  Eyes:     General: No scleral icterus.       Right eye: No discharge.        Left eye: No discharge.     Extraocular Movements: Extraocular movements intact.  Conjunctiva/sclera: Conjunctivae normal.  Cardiovascular:     Rate and Rhythm: Normal rate and regular rhythm.     Pulses: Normal pulses.     Heart sounds: Normal heart sounds. No murmur heard.    No friction rub. No gallop.  Pulmonary:     Effort: Pulmonary effort is normal. No respiratory distress.     Breath sounds: No stridor. No wheezing, rhonchi or rales.  Chest:     Chest wall: No tenderness.  Abdominal:     General: Abdomen is flat. There is no distension.     Palpations: Abdomen is soft.     Tenderness: There is no abdominal tenderness. There is no right CVA tenderness, left CVA tenderness, guarding or rebound.  Musculoskeletal:        General: Swelling  and tenderness present. No deformity or signs of injury.     Cervical back: Normal range of motion. No rigidity.     Right lower leg: No edema.     Left lower leg: No edema.     Comments: He has right medial and lateral malleolus tenderness to palpation, with some mild swelling, no dorsal tenderness over foot and as well as no plantar tenderness, no anterior shin or posterior calf tenderness to palpation.  No other injuries noted otherwise to upper or lower extremities.  Neurovascular intact, DP pulse 2+.  Skin:    General: Skin is warm and dry.     Findings: No bruising, erythema or lesion.  Neurological:     General: No focal deficit present.     Mental Status: She is alert and oriented to person, place, and time. Mental status is at baseline.     Sensory: No sensory deficit.     Motor: No weakness.  Psychiatric:        Mood and Affect: Mood normal.     (all labs ordered are listed, but only abnormal results are displayed) Labs Reviewed - No data to display  EKG: None  Radiology: DG Ankle Complete Right Result Date: 04/15/2024 EXAM: 3 OR MORE VIEW(S) XRAY OF THE RIGHT ANKLE 04/15/2024 08:23:00 PM CLINICAL HISTORY: pain pain COMPARISON: None available. FINDINGS: BONES AND JOINTS: Transverse nondisplaced fracture noted through the distal fibula lateral malleolus. Oblique nondisplaced fracture at the base of the medial malleolus. No joint dislocation. SOFT TISSUES: Soft tissue swelling laterally. IMPRESSION: 1. Transverse nondisplaced fracture of the distal fibula at the lateral malleolus. 2. Oblique nondisplaced fracture at the base of the medial malleolus. 3. Lateral soft tissue swelling. Electronically signed by: Franky Crease MD 04/15/2024 08:30 PM EST RP Workstation: [id number]    Procedures   Medications Ordered in the ED  oxyCODONE -acetaminophen  (PERCOCET/ROXICET) 5-325 MG per tablet 1 tablet (1 tablet Oral Given 04/15/24 2301)    Clinical Course as of 04/15/24 2337  Sat Apr 15, 2024  2317 Spoke with Dr. Sharl with [facility], who recommended that patient be placed in a boot and made mildly weightbearing, walking with a walker and to follow-up in the outpatient setting.   [CB]    Clinical Course User Index [CB] Beola Terrall RAMAN, PA-C   Medical Decision Making Amount and/or Complexity of Data Reviewed Radiology: ordered.   This patient is a 89 year old female who presents to the ED for concern of right ankle pain after getting up from when she was sleeping, with noting that her foot was asleep and she twisted it while it was asleep.  Notably had pain afterward and slid to the floor, did not hit  head, did not lose consciousness, is not complaining of any other pain elsewhere.  On physical exam, patient is in no acute distress, afebrile, alert and orient x 4, speaking in full sentences, nontachypneic, nontachycardic.  Notably is neurovascularly intact, with good sensation to foot, with tenderness noted over lateral and medial malleolus of right ankle.  Unremarkable otherwise.  No other injuries noted otherwise to upper or lower extremities.  X-ray showed lateral and medial malleolus fractures.  Spoke with Dr. Sharl with [facility] who recommended cam boot and mild weightbearing with walker and following up in outpatient setting.  Reiterated these instructions to the patient.  Patient was provided boot.  Will have her continue manage pain With chronic pain management that she has at home.  Patient vital signs have remained stable throughout the course of patient's time in the ED. Low suspicion for any other emergent pathology at this time. I believe this patient is safe to be discharged. Provided strict return to ER precautions. Patient expressed agreement and understanding of plan. All questions were answered.  Differential diagnoses prior to evaluation: The emergent differential diagnosis includes, but is not limited to, fracture, ligamentous injury,  neurovascular injury, dislocation, malalignment. This is not an exhaustive differential.   Past Medical History / Co-morbidities / Social History: Heart murmur, ulcerative colitis, anxiety, fibromyalgia  Additional history: Chart reviewed. Pertinent results include:  Previous left ankle done by Dr. Kit and right knee done by Dr. Fidel  Lab Tests/Imaging studies: I personally interpreted labs/imaging and the pertinent results include:   X-ray of right ankle shows transverse nondisplaced fracture of the distal fibula as well as oblique nondisplaced fracture of the medial malleolus I agree with the radiologist interpretation.  Medications: I ordered medication including Percocet.  I have reviewed the patients home medicines and have made adjustments as needed.  Critical Interventions: None  Social Determinants of Health: Has good follow-up with orthopedic surgery and lives at home with daughter  Disposition: After consideration of the diagnostic results and the patients response to treatment, I feel that the patient would benefit from discharge and treatment as above.   emergency department workup does not suggest an emergent condition requiring admission or immediate intervention beyond what has been performed at this time. The plan is: Follow-up with Ortho surgery, return to the ER for new or symptoms, symptomatic management at home. The patient is safe for discharge and has been instructed to return immediately for worsening symptoms, change in symptoms or any other concerns.   Final diagnoses:  Closed fracture of right ankle, initial encounter    ED Discharge Orders     None          Beola Terrall GORMAN DEVONNA 04/15/24 2337    Geraldene Hamilton, MD 04/16/24 [phone]

## 2024-04-15 NOTE — Discharge Instructions (Addendum)
 You were seen today for right ankle fracture, noting to have had a nondisplaced fracture to both sides of your right ankle.  You will need to continue to walk in a boot, with only mild weightbearing to the right side until you are further evaluated with [facility], orthopedics.  I have attached information here for you to call their office and schedule an appointment for early this next week.  Continue using your at home pain medications as this should be more than efficient enough to have pain relief particularly with the boot.

## 2024-04-15 NOTE — ED Triage Notes (Signed)
 Patient states she was sitting in her chair and fell asleep. When she got up to use her walker and it was not locked and she fell on her right side, twisting her ankle. Patient denies hitting her head. No LOC. Denies pain in any other extremity besides her ankle. Patient is alert and oriented x4. No blood thinners.

## 2025-01-08 ENCOUNTER — Other Ambulatory Visit

## 2025-01-08 ENCOUNTER — Ambulatory Visit: Admitting: Hematology
